# Patient Record
Sex: Female | Born: 1940 | Race: Black or African American | Hispanic: No | Smoking: Current every day smoker
Health system: Southern US, Community
[De-identification: ages and names within clinical notes are randomized; demographics above are authoritative.]

## PROBLEM LIST (undated history)

## (undated) DIAGNOSIS — I69959 Hemiplegia and hemiparesis following unspecified cerebrovascular disease affecting unspecified side: Secondary | ICD-10-CM

## (undated) DIAGNOSIS — K219 Gastro-esophageal reflux disease without esophagitis: Secondary | ICD-10-CM

## (undated) DIAGNOSIS — K760 Fatty (change of) liver, not elsewhere classified: Secondary | ICD-10-CM

## (undated) DIAGNOSIS — E119 Type 2 diabetes mellitus without complications: Secondary | ICD-10-CM

## (undated) DIAGNOSIS — E7800 Pure hypercholesterolemia, unspecified: Secondary | ICD-10-CM

## (undated) DIAGNOSIS — K7460 Unspecified cirrhosis of liver: Secondary | ICD-10-CM

## (undated) DIAGNOSIS — M1990 Unspecified osteoarthritis, unspecified site: Secondary | ICD-10-CM

## (undated) DIAGNOSIS — M810 Age-related osteoporosis without current pathological fracture: Secondary | ICD-10-CM

## (undated) DIAGNOSIS — I219 Acute myocardial infarction, unspecified: Secondary | ICD-10-CM

## (undated) DIAGNOSIS — I10 Essential (primary) hypertension: Secondary | ICD-10-CM

## (undated) DIAGNOSIS — J309 Allergic rhinitis, unspecified: Secondary | ICD-10-CM

## (undated) DIAGNOSIS — K5790 Diverticulosis of intestine, part unspecified, without perforation or abscess without bleeding: Secondary | ICD-10-CM

## (undated) DIAGNOSIS — I639 Cerebral infarction, unspecified: Secondary | ICD-10-CM

## (undated) DIAGNOSIS — R569 Unspecified convulsions: Secondary | ICD-10-CM

## (undated) DIAGNOSIS — I5030 Unspecified diastolic (congestive) heart failure: Secondary | ICD-10-CM

## (undated) DIAGNOSIS — F329 Major depressive disorder, single episode, unspecified: Secondary | ICD-10-CM

## (undated) DIAGNOSIS — K2970 Gastritis, unspecified, without bleeding: Secondary | ICD-10-CM

## (undated) DIAGNOSIS — G43909 Migraine, unspecified, not intractable, without status migrainosus: Secondary | ICD-10-CM

## (undated) DIAGNOSIS — Z780 Asymptomatic menopausal state: Secondary | ICD-10-CM

## (undated) DIAGNOSIS — T464X5A Adverse effect of angiotensin-converting-enzyme inhibitors, initial encounter: Secondary | ICD-10-CM

## (undated) DIAGNOSIS — F32A Depression, unspecified: Secondary | ICD-10-CM

## (undated) DIAGNOSIS — R058 Other specified cough: Secondary | ICD-10-CM

## (undated) DIAGNOSIS — D126 Benign neoplasm of colon, unspecified: Secondary | ICD-10-CM

## (undated) DIAGNOSIS — G4733 Obstructive sleep apnea (adult) (pediatric): Secondary | ICD-10-CM

## (undated) DIAGNOSIS — J449 Chronic obstructive pulmonary disease, unspecified: Secondary | ICD-10-CM

## (undated) DIAGNOSIS — E669 Obesity, unspecified: Secondary | ICD-10-CM

## (undated) DIAGNOSIS — R51 Headache: Secondary | ICD-10-CM

## (undated) DIAGNOSIS — G8929 Other chronic pain: Secondary | ICD-10-CM

## (undated) DIAGNOSIS — G629 Polyneuropathy, unspecified: Secondary | ICD-10-CM

## (undated) DIAGNOSIS — M549 Dorsalgia, unspecified: Secondary | ICD-10-CM

## (undated) DIAGNOSIS — I509 Heart failure, unspecified: Secondary | ICD-10-CM

## (undated) DIAGNOSIS — K3184 Gastroparesis: Secondary | ICD-10-CM

## (undated) DIAGNOSIS — R079 Chest pain, unspecified: Secondary | ICD-10-CM

## (undated) DIAGNOSIS — R0602 Shortness of breath: Secondary | ICD-10-CM

## (undated) DIAGNOSIS — E785 Hyperlipidemia, unspecified: Secondary | ICD-10-CM

## (undated) HISTORY — DX: Chronic obstructive pulmonary disease, unspecified: J44.9

## (undated) HISTORY — DX: Hemiplegia and hemiparesis following unspecified cerebrovascular disease affecting unspecified side: I69.959

## (undated) HISTORY — DX: Unspecified convulsions: R56.9

## (undated) HISTORY — DX: Benign neoplasm of colon, unspecified: D12.6

## (undated) HISTORY — PX: REDUCTION MAMMAPLASTY: SUR839

## (undated) HISTORY — DX: Diverticulosis of intestine, part unspecified, without perforation or abscess without bleeding: K57.90

## (undated) HISTORY — DX: Allergic rhinitis, unspecified: J30.9

## (undated) HISTORY — DX: Unspecified osteoarthritis, unspecified site: M19.90

## (undated) HISTORY — DX: Obstructive sleep apnea (adult) (pediatric): G47.33

## (undated) HISTORY — DX: Age-related osteoporosis without current pathological fracture: M81.0

## (undated) HISTORY — DX: Shortness of breath: R06.02

## (undated) HISTORY — DX: Asymptomatic menopausal state: Z78.0

## (undated) HISTORY — DX: Gastroparesis: K31.84

## (undated) HISTORY — DX: Pure hypercholesterolemia, unspecified: E78.00

## (undated) HISTORY — DX: Fatty (change of) liver, not elsewhere classified: K76.0

## (undated) HISTORY — PX: CATARACT EXTRACTION W/ INTRAOCULAR LENS  IMPLANT, BILATERAL: SHX1307

## (undated) HISTORY — DX: Essential (primary) hypertension: I10

## (undated) HISTORY — DX: Unspecified cirrhosis of liver: K74.60

## (undated) HISTORY — PX: ABDOMINAL HYSTERECTOMY: SHX81

## (undated) HISTORY — DX: Major depressive disorder, single episode, unspecified: F32.9

## (undated) HISTORY — DX: Gastro-esophageal reflux disease without esophagitis: K21.9

## (undated) HISTORY — DX: Chest pain, unspecified: R07.9

## (undated) HISTORY — DX: Gastritis, unspecified, without bleeding: K29.70

---
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
## 1997-09-17 ENCOUNTER — Emergency Department (HOSPITAL_COMMUNITY): Admission: EM | Admit: 1997-09-17 | Discharge: 1997-09-17 | Payer: Self-pay | Admitting: Emergency Medicine

## 1998-12-29 ENCOUNTER — Encounter: Admission: RE | Admit: 1998-12-29 | Discharge: 1999-03-29 | Payer: Self-pay | Admitting: Neurosurgery

## 1999-04-01 ENCOUNTER — Inpatient Hospital Stay (HOSPITAL_COMMUNITY): Admission: EM | Admit: 1999-04-01 | Discharge: 1999-04-02 | Payer: Self-pay | Admitting: Emergency Medicine

## 1999-04-01 ENCOUNTER — Encounter: Payer: Self-pay | Admitting: Emergency Medicine

## 1999-08-18 ENCOUNTER — Emergency Department (HOSPITAL_COMMUNITY): Admission: EM | Admit: 1999-08-18 | Discharge: 1999-08-18 | Payer: Self-pay | Admitting: Emergency Medicine

## 2002-02-20 ENCOUNTER — Other Ambulatory Visit: Admission: RE | Admit: 2002-02-20 | Discharge: 2002-02-20 | Payer: Self-pay | Admitting: Endocrinology

## 2002-06-30 ENCOUNTER — Encounter: Payer: Self-pay | Admitting: Endocrinology

## 2002-06-30 ENCOUNTER — Ambulatory Visit (HOSPITAL_COMMUNITY): Admission: RE | Admit: 2002-06-30 | Discharge: 2002-06-30 | Payer: Self-pay | Admitting: Endocrinology

## 2003-09-09 ENCOUNTER — Ambulatory Visit (HOSPITAL_COMMUNITY): Admission: RE | Admit: 2003-09-09 | Discharge: 2003-09-09 | Payer: Self-pay | Admitting: Endocrinology

## 2003-10-28 ENCOUNTER — Inpatient Hospital Stay (HOSPITAL_BASED_OUTPATIENT_CLINIC_OR_DEPARTMENT_OTHER): Admission: RE | Admit: 2003-10-28 | Discharge: 2003-10-28 | Payer: Self-pay | Admitting: Cardiology

## 2003-11-15 ENCOUNTER — Ambulatory Visit (HOSPITAL_COMMUNITY): Admission: RE | Admit: 2003-11-15 | Discharge: 2003-11-15 | Payer: Self-pay | Admitting: Endocrinology

## 2004-04-19 DIAGNOSIS — I639 Cerebral infarction, unspecified: Secondary | ICD-10-CM

## 2004-04-19 HISTORY — DX: Cerebral infarction, unspecified: I63.9

## 2004-04-22 ENCOUNTER — Inpatient Hospital Stay (HOSPITAL_COMMUNITY): Admission: EM | Admit: 2004-04-22 | Discharge: 2004-04-24 | Payer: Self-pay | Admitting: Emergency Medicine

## 2004-04-23 ENCOUNTER — Encounter (INDEPENDENT_AMBULATORY_CARE_PROVIDER_SITE_OTHER): Payer: Self-pay | Admitting: Cardiology

## 2004-06-25 ENCOUNTER — Ambulatory Visit: Payer: Self-pay | Admitting: Endocrinology

## 2004-09-18 ENCOUNTER — Ambulatory Visit: Payer: Self-pay | Admitting: Endocrinology

## 2004-09-25 ENCOUNTER — Ambulatory Visit: Payer: Self-pay | Admitting: Endocrinology

## 2004-10-01 ENCOUNTER — Ambulatory Visit: Payer: Self-pay | Admitting: Endocrinology

## 2004-10-06 ENCOUNTER — Ambulatory Visit: Payer: Self-pay | Admitting: Internal Medicine

## 2004-10-12 ENCOUNTER — Ambulatory Visit (HOSPITAL_COMMUNITY): Admission: RE | Admit: 2004-10-12 | Discharge: 2004-10-12 | Payer: Self-pay | Admitting: Internal Medicine

## 2004-10-12 ENCOUNTER — Ambulatory Visit: Payer: Self-pay | Admitting: Internal Medicine

## 2004-12-31 ENCOUNTER — Ambulatory Visit: Payer: Self-pay | Admitting: Endocrinology

## 2005-05-27 ENCOUNTER — Emergency Department (HOSPITAL_COMMUNITY): Admission: EM | Admit: 2005-05-27 | Discharge: 2005-05-28 | Payer: Self-pay | Admitting: Emergency Medicine

## 2005-07-01 ENCOUNTER — Ambulatory Visit: Payer: Self-pay | Admitting: Endocrinology

## 2005-07-20 ENCOUNTER — Ambulatory Visit: Payer: Self-pay | Admitting: Endocrinology

## 2005-07-27 ENCOUNTER — Ambulatory Visit: Payer: Self-pay | Admitting: Endocrinology

## 2005-08-11 ENCOUNTER — Ambulatory Visit: Payer: Self-pay | Admitting: Endocrinology

## 2005-09-07 ENCOUNTER — Ambulatory Visit: Payer: Self-pay | Admitting: Endocrinology

## 2005-09-07 ENCOUNTER — Emergency Department (HOSPITAL_COMMUNITY): Admission: EM | Admit: 2005-09-07 | Discharge: 2005-09-07 | Payer: Self-pay | Admitting: Emergency Medicine

## 2005-09-16 ENCOUNTER — Ambulatory Visit: Payer: Self-pay | Admitting: Endocrinology

## 2005-10-07 ENCOUNTER — Ambulatory Visit: Payer: Self-pay | Admitting: Endocrinology

## 2005-10-14 ENCOUNTER — Ambulatory Visit: Payer: Self-pay | Admitting: Endocrinology

## 2006-01-14 ENCOUNTER — Ambulatory Visit: Payer: Self-pay | Admitting: Endocrinology

## 2006-03-25 ENCOUNTER — Emergency Department (HOSPITAL_COMMUNITY): Admission: EM | Admit: 2006-03-25 | Discharge: 2006-03-25 | Payer: Self-pay | Admitting: Emergency Medicine

## 2006-03-29 ENCOUNTER — Ambulatory Visit: Payer: Self-pay | Admitting: Cardiology

## 2006-03-29 ENCOUNTER — Encounter: Payer: Self-pay | Admitting: Cardiology

## 2006-03-29 ENCOUNTER — Observation Stay (HOSPITAL_COMMUNITY): Admission: EM | Admit: 2006-03-29 | Discharge: 2006-03-30 | Payer: Self-pay | Admitting: Emergency Medicine

## 2006-03-29 ENCOUNTER — Ambulatory Visit: Payer: Self-pay | Admitting: Internal Medicine

## 2006-04-07 ENCOUNTER — Ambulatory Visit: Payer: Self-pay

## 2006-04-14 ENCOUNTER — Ambulatory Visit: Payer: Self-pay

## 2006-04-15 ENCOUNTER — Ambulatory Visit: Payer: Self-pay | Admitting: Endocrinology

## 2006-06-29 ENCOUNTER — Ambulatory Visit: Payer: Self-pay | Admitting: Endocrinology

## 2006-07-13 ENCOUNTER — Ambulatory Visit: Payer: Self-pay | Admitting: Endocrinology

## 2006-07-13 LAB — CONVERTED CEMR LAB
BUN: 16 mg/dL (ref 6–23)
GFR calc Af Amer: 93 mL/min
Hgb A1c MFr Bld: 7.3 % — ABNORMAL HIGH (ref 4.6–6.0)
Potassium: 3.8 meq/L (ref 3.5–5.1)
Sodium: 142 meq/L (ref 135–145)

## 2006-10-29 ENCOUNTER — Encounter: Payer: Self-pay | Admitting: Endocrinology

## 2006-10-29 DIAGNOSIS — K219 Gastro-esophageal reflux disease without esophagitis: Secondary | ICD-10-CM

## 2006-10-29 DIAGNOSIS — M810 Age-related osteoporosis without current pathological fracture: Secondary | ICD-10-CM

## 2006-10-29 DIAGNOSIS — J309 Allergic rhinitis, unspecified: Secondary | ICD-10-CM

## 2006-10-29 DIAGNOSIS — K7460 Unspecified cirrhosis of liver: Secondary | ICD-10-CM

## 2006-10-29 HISTORY — DX: Gastro-esophageal reflux disease without esophagitis: K21.9

## 2006-10-29 HISTORY — DX: Allergic rhinitis, unspecified: J30.9

## 2006-10-29 HISTORY — DX: Age-related osteoporosis without current pathological fracture: M81.0

## 2006-10-29 HISTORY — DX: Unspecified cirrhosis of liver: K74.60

## 2007-01-14 ENCOUNTER — Emergency Department (HOSPITAL_COMMUNITY): Admission: EM | Admit: 2007-01-14 | Discharge: 2007-01-15 | Payer: Self-pay | Admitting: Emergency Medicine

## 2007-01-19 ENCOUNTER — Ambulatory Visit: Payer: Self-pay | Admitting: Endocrinology

## 2007-01-19 LAB — CONVERTED CEMR LAB: Hgb A1c MFr Bld: 14.3 % — ABNORMAL HIGH (ref 4.6–6.0)

## 2007-02-09 ENCOUNTER — Ambulatory Visit: Payer: Self-pay | Admitting: Endocrinology

## 2007-02-09 LAB — CONVERTED CEMR LAB
ALT: 11 units/L (ref 0–35)
AST: 13 units/L (ref 0–37)
Albumin: 3 g/dL — ABNORMAL LOW (ref 3.5–5.2)
Alkaline Phosphatase: 84 units/L (ref 39–117)
BUN: 11 mg/dL (ref 6–23)
Basophils Relative: 0.2 % (ref 0.0–1.0)
CO2: 36 meq/L — ABNORMAL HIGH (ref 19–32)
Calcium: 9.2 mg/dL (ref 8.4–10.5)
Chloride: 96 meq/L (ref 96–112)
Creatinine, Ser: 0.9 mg/dL (ref 0.4–1.2)
Crystals: NEGATIVE
Eosinophils Relative: 0.3 % (ref 0.0–5.0)
Hemoglobin: 13 g/dL (ref 12.0–15.0)
LDL Cholesterol: 42 mg/dL (ref 0–99)
Lymphocytes Relative: 27.9 % (ref 12.0–46.0)
Microalb, Ur: 1 mg/dL (ref 0.0–1.9)
Monocytes Relative: 7 % (ref 3.0–11.0)
Mucus, UA: NEGATIVE
Neutro Abs: 6 10*3/uL (ref 1.4–7.7)
Platelets: 243 10*3/uL (ref 150–400)
Potassium: 3 meq/L — ABNORMAL LOW (ref 3.5–5.1)
RDW: 12.1 % (ref 11.5–14.6)
Specific Gravity, Urine: 1.02 (ref 1.000–1.03)
Total Bilirubin: 0.8 mg/dL (ref 0.3–1.2)
Total Protein: 6.3 g/dL (ref 6.0–8.3)
Triglycerides: 97 mg/dL (ref 0–149)
Urobilinogen, UA: 2 (ref 0.0–1.0)
VLDL: 19 mg/dL (ref 0–40)
WBC: 9.1 10*3/uL (ref 4.5–10.5)

## 2007-02-16 ENCOUNTER — Ambulatory Visit: Payer: Self-pay | Admitting: Endocrinology

## 2007-02-16 ENCOUNTER — Ambulatory Visit: Payer: Self-pay | Admitting: Internal Medicine

## 2007-02-27 ENCOUNTER — Encounter: Payer: Self-pay | Admitting: Endocrinology

## 2007-04-20 HISTORY — PX: APPENDECTOMY: SHX54

## 2007-05-05 ENCOUNTER — Inpatient Hospital Stay (HOSPITAL_COMMUNITY): Admission: EM | Admit: 2007-05-05 | Discharge: 2007-05-17 | Payer: Self-pay | Admitting: Emergency Medicine

## 2007-05-06 ENCOUNTER — Encounter (INDEPENDENT_AMBULATORY_CARE_PROVIDER_SITE_OTHER): Payer: Self-pay | Admitting: General Surgery

## 2007-05-10 ENCOUNTER — Encounter (INDEPENDENT_AMBULATORY_CARE_PROVIDER_SITE_OTHER): Payer: Self-pay | Admitting: Surgery

## 2007-05-10 ENCOUNTER — Ambulatory Visit: Payer: Self-pay | Admitting: Vascular Surgery

## 2007-05-22 ENCOUNTER — Ambulatory Visit: Payer: Self-pay | Admitting: Endocrinology

## 2007-05-22 DIAGNOSIS — R109 Unspecified abdominal pain: Secondary | ICD-10-CM | POA: Insufficient documentation

## 2007-05-22 LAB — CONVERTED CEMR LAB
AST: 12 units/L (ref 0–37)
Amylase: 60 units/L (ref 27–131)
Bilirubin, Direct: 0.2 mg/dL (ref 0.0–0.3)
Chloride: 98 meq/L (ref 96–112)
Creatinine, Ser: 1.1 mg/dL (ref 0.4–1.2)
Eosinophils Absolute: 0 10*3/uL (ref 0.0–0.6)
Eosinophils Relative: 0.4 % (ref 0.0–5.0)
Glucose, Bld: 162 mg/dL — ABNORMAL HIGH (ref 70–99)
HCT: 31.9 % — ABNORMAL LOW (ref 36.0–46.0)
Hemoglobin: 10.5 g/dL — ABNORMAL LOW (ref 12.0–15.0)
MCV: 96.8 fL (ref 78.0–100.0)
Monocytes Absolute: 1 10*3/uL — ABNORMAL HIGH (ref 0.2–0.7)
Neutrophils Relative %: 72.9 % (ref 43.0–77.0)
Potassium: 4 meq/L (ref 3.5–5.1)
RBC: 3.3 M/uL — ABNORMAL LOW (ref 3.87–5.11)
Sodium: 137 meq/L (ref 135–145)
Total Bilirubin: 0.7 mg/dL (ref 0.3–1.2)
WBC: 11.9 10*3/uL — ABNORMAL HIGH (ref 4.5–10.5)

## 2007-05-23 ENCOUNTER — Ambulatory Visit: Payer: Self-pay | Admitting: Cardiovascular Disease

## 2007-06-08 ENCOUNTER — Ambulatory Visit: Payer: Self-pay | Admitting: Endocrinology

## 2007-06-09 ENCOUNTER — Encounter: Payer: Self-pay | Admitting: Endocrinology

## 2007-06-09 LAB — CONVERTED CEMR LAB
Amylase: 57 units/L (ref 27–131)
BUN: 10 mg/dL (ref 6–23)
Basophils Relative: 0.4 % (ref 0.0–1.0)
Creatinine, Ser: 1 mg/dL (ref 0.4–1.2)
Crystals: NEGATIVE
Hgb A1c MFr Bld: 7.6 % — ABNORMAL HIGH (ref 4.6–6.0)
Monocytes Relative: 5.9 % (ref 3.0–11.0)
Platelets: 277 10*3/uL (ref 150–400)
Potassium: 2.9 meq/L — ABNORMAL LOW (ref 3.5–5.1)
RBC: 3.9 M/uL (ref 3.87–5.11)
RDW: 13.4 % (ref 11.5–14.6)
Specific Gravity, Urine: 1.02 (ref 1.000–1.03)
Urine Glucose: NEGATIVE mg/dL
Urobilinogen, UA: 2 (ref 0.0–1.0)

## 2007-06-15 ENCOUNTER — Ambulatory Visit: Payer: Self-pay | Admitting: Endocrinology

## 2007-06-15 DIAGNOSIS — M1990 Unspecified osteoarthritis, unspecified site: Secondary | ICD-10-CM | POA: Insufficient documentation

## 2007-06-15 HISTORY — DX: Unspecified osteoarthritis, unspecified site: M19.90

## 2007-09-13 ENCOUNTER — Ambulatory Visit: Payer: Self-pay | Admitting: Endocrinology

## 2007-09-13 DIAGNOSIS — D126 Benign neoplasm of colon, unspecified: Secondary | ICD-10-CM

## 2007-09-13 HISTORY — DX: Benign neoplasm of colon, unspecified: D12.6

## 2008-01-15 ENCOUNTER — Ambulatory Visit: Payer: Self-pay | Admitting: Endocrinology

## 2008-01-15 DIAGNOSIS — E876 Hypokalemia: Secondary | ICD-10-CM | POA: Insufficient documentation

## 2008-01-15 LAB — CONVERTED CEMR LAB
Calcium: 8.9 mg/dL (ref 8.4–10.5)
Chloride: 99 meq/L (ref 96–112)
Creatinine, Ser: 1.1 mg/dL (ref 0.4–1.2)
GFR calc non Af Amer: 53 mL/min
Hgb A1c MFr Bld: 13 % — ABNORMAL HIGH (ref 4.6–6.0)

## 2008-02-09 ENCOUNTER — Telehealth: Payer: Self-pay | Admitting: Endocrinology

## 2008-02-22 ENCOUNTER — Ambulatory Visit: Payer: Self-pay | Admitting: Endocrinology

## 2008-02-22 DIAGNOSIS — R079 Chest pain, unspecified: Secondary | ICD-10-CM

## 2008-02-22 DIAGNOSIS — F329 Major depressive disorder, single episode, unspecified: Secondary | ICD-10-CM

## 2008-02-22 DIAGNOSIS — Z780 Asymptomatic menopausal state: Secondary | ICD-10-CM

## 2008-02-22 DIAGNOSIS — F3289 Other specified depressive episodes: Secondary | ICD-10-CM

## 2008-02-22 DIAGNOSIS — R0602 Shortness of breath: Secondary | ICD-10-CM

## 2008-02-22 DIAGNOSIS — R0789 Other chest pain: Secondary | ICD-10-CM

## 2008-02-22 DIAGNOSIS — F17200 Nicotine dependence, unspecified, uncomplicated: Secondary | ICD-10-CM

## 2008-02-22 HISTORY — DX: Major depressive disorder, single episode, unspecified: F32.9

## 2008-02-22 HISTORY — DX: Other specified depressive episodes: F32.89

## 2008-02-22 HISTORY — DX: Asymptomatic menopausal state: Z78.0

## 2008-02-22 HISTORY — DX: Chest pain, unspecified: R07.9

## 2008-02-25 LAB — CONVERTED CEMR LAB
ALT: 15 units/L (ref 0–35)
AST: 20 units/L (ref 0–37)
Bilirubin, Direct: 0.1 mg/dL (ref 0.0–0.3)
Creatinine,U: 223.4 mg/dL
Eosinophils Relative: 0.5 % (ref 0.0–5.0)
HCT: 40.7 % (ref 36.0–46.0)
Hemoglobin: 14.2 g/dL (ref 12.0–15.0)
Microalb, Ur: 0.7 mg/dL (ref 0.0–1.9)
Monocytes Absolute: 0.6 10*3/uL (ref 0.1–1.0)
Monocytes Relative: 6.6 % (ref 3.0–12.0)
Neutro Abs: 5.3 10*3/uL (ref 1.4–7.7)
Nitrite: NEGATIVE
Specific Gravity, Urine: 1.025 (ref 1.000–1.03)
Total Protein, Urine: NEGATIVE mg/dL
Total Protein: 6.9 g/dL (ref 6.0–8.3)
WBC: 8.8 10*3/uL (ref 4.5–10.5)
pH: 5.5 (ref 5.0–8.0)

## 2008-02-26 ENCOUNTER — Ambulatory Visit: Payer: Self-pay | Admitting: Endocrinology

## 2008-02-27 ENCOUNTER — Telehealth: Payer: Self-pay | Admitting: Endocrinology

## 2008-02-29 ENCOUNTER — Ambulatory Visit: Payer: Self-pay | Admitting: Endocrinology

## 2008-03-06 ENCOUNTER — Encounter: Admission: RE | Admit: 2008-03-06 | Discharge: 2008-03-06 | Payer: Self-pay | Admitting: Endocrinology

## 2008-03-06 ENCOUNTER — Encounter: Payer: Self-pay | Admitting: Endocrinology

## 2008-03-08 ENCOUNTER — Encounter: Payer: Self-pay | Admitting: Endocrinology

## 2008-03-08 ENCOUNTER — Ambulatory Visit: Payer: Self-pay | Admitting: Internal Medicine

## 2008-04-12 ENCOUNTER — Ambulatory Visit: Payer: Self-pay | Admitting: Internal Medicine

## 2008-04-12 ENCOUNTER — Inpatient Hospital Stay (HOSPITAL_COMMUNITY): Admission: EM | Admit: 2008-04-12 | Discharge: 2008-04-14 | Payer: Self-pay | Admitting: Emergency Medicine

## 2008-04-22 ENCOUNTER — Telehealth: Payer: Self-pay | Admitting: Endocrinology

## 2008-04-22 ENCOUNTER — Ambulatory Visit: Payer: Self-pay | Admitting: Endocrinology

## 2008-04-22 LAB — CONVERTED CEMR LAB
CO2: 26 meq/L (ref 19–32)
Calcium: 9.3 mg/dL (ref 8.4–10.5)
Chloride: 106 meq/L (ref 96–112)
Glucose, Bld: 164 mg/dL — ABNORMAL HIGH (ref 70–99)
Sodium: 140 meq/L (ref 135–145)

## 2008-05-13 ENCOUNTER — Telehealth: Payer: Self-pay | Admitting: Endocrinology

## 2008-06-06 ENCOUNTER — Telehealth (INDEPENDENT_AMBULATORY_CARE_PROVIDER_SITE_OTHER): Payer: Self-pay | Admitting: *Deleted

## 2008-06-10 ENCOUNTER — Ambulatory Visit: Payer: Self-pay | Admitting: Endocrinology

## 2008-06-26 ENCOUNTER — Encounter: Payer: Self-pay | Admitting: Endocrinology

## 2008-07-23 ENCOUNTER — Telehealth (INDEPENDENT_AMBULATORY_CARE_PROVIDER_SITE_OTHER): Payer: Self-pay | Admitting: *Deleted

## 2008-12-20 ENCOUNTER — Ambulatory Visit: Payer: Self-pay | Admitting: Endocrinology

## 2008-12-20 DIAGNOSIS — I69959 Hemiplegia and hemiparesis following unspecified cerebrovascular disease affecting unspecified side: Secondary | ICD-10-CM | POA: Insufficient documentation

## 2008-12-20 DIAGNOSIS — I10 Essential (primary) hypertension: Secondary | ICD-10-CM | POA: Insufficient documentation

## 2008-12-20 HISTORY — DX: Hemiplegia and hemiparesis following unspecified cerebrovascular disease affecting unspecified side: I69.959

## 2008-12-20 HISTORY — DX: Essential (primary) hypertension: I10

## 2008-12-20 LAB — CONVERTED CEMR LAB
Creatinine,U: 171.4 mg/dL
Hgb A1c MFr Bld: 7.6 % — ABNORMAL HIGH (ref 4.6–6.5)
Microalb Creat Ratio: 3.5 mg/g (ref 0.0–30.0)

## 2009-01-07 ENCOUNTER — Encounter: Admission: RE | Admit: 2009-01-07 | Discharge: 2009-03-07 | Payer: Self-pay | Admitting: Endocrinology

## 2009-01-18 ENCOUNTER — Encounter: Payer: Self-pay | Admitting: Endocrinology

## 2009-02-07 ENCOUNTER — Ambulatory Visit: Payer: Self-pay | Admitting: Endocrinology

## 2009-02-10 ENCOUNTER — Telehealth (INDEPENDENT_AMBULATORY_CARE_PROVIDER_SITE_OTHER): Payer: Self-pay | Admitting: *Deleted

## 2009-02-11 ENCOUNTER — Ambulatory Visit: Payer: Self-pay | Admitting: Endocrinology

## 2009-02-25 ENCOUNTER — Ambulatory Visit: Payer: Self-pay | Admitting: Endocrinology

## 2009-02-25 DIAGNOSIS — E7800 Pure hypercholesterolemia, unspecified: Secondary | ICD-10-CM | POA: Insufficient documentation

## 2009-02-25 HISTORY — DX: Pure hypercholesterolemia, unspecified: E78.00

## 2009-03-05 ENCOUNTER — Encounter: Payer: Self-pay | Admitting: Endocrinology

## 2009-03-18 ENCOUNTER — Telehealth: Payer: Self-pay | Admitting: Endocrinology

## 2009-03-18 ENCOUNTER — Encounter (HOSPITAL_COMMUNITY): Admission: RE | Admit: 2009-03-18 | Discharge: 2009-04-18 | Payer: Self-pay | Admitting: Endocrinology

## 2009-03-18 ENCOUNTER — Encounter: Payer: Self-pay | Admitting: Endocrinology

## 2009-03-19 ENCOUNTER — Ambulatory Visit: Payer: Self-pay | Admitting: Cardiology

## 2009-03-20 ENCOUNTER — Encounter (INDEPENDENT_AMBULATORY_CARE_PROVIDER_SITE_OTHER): Payer: Self-pay | Admitting: *Deleted

## 2009-03-21 ENCOUNTER — Ambulatory Visit: Payer: Self-pay | Admitting: Endocrinology

## 2009-04-22 ENCOUNTER — Ambulatory Visit: Payer: Self-pay | Admitting: Cardiology

## 2009-04-22 DIAGNOSIS — J449 Chronic obstructive pulmonary disease, unspecified: Secondary | ICD-10-CM

## 2009-04-22 DIAGNOSIS — J4489 Other specified chronic obstructive pulmonary disease: Secondary | ICD-10-CM

## 2009-04-22 HISTORY — DX: Other specified chronic obstructive pulmonary disease: J44.89

## 2009-04-22 HISTORY — DX: Chronic obstructive pulmonary disease, unspecified: J44.9

## 2009-04-23 ENCOUNTER — Ambulatory Visit (HOSPITAL_COMMUNITY): Admission: RE | Admit: 2009-04-23 | Discharge: 2009-04-23 | Payer: Self-pay | Admitting: Cardiology

## 2009-04-23 ENCOUNTER — Encounter: Payer: Self-pay | Admitting: Cardiology

## 2009-04-23 ENCOUNTER — Ambulatory Visit: Payer: Self-pay | Admitting: Cardiology

## 2009-04-23 ENCOUNTER — Ambulatory Visit: Payer: Self-pay

## 2009-04-23 ENCOUNTER — Telehealth: Payer: Self-pay | Admitting: Cardiology

## 2009-04-23 DIAGNOSIS — R0609 Other forms of dyspnea: Secondary | ICD-10-CM

## 2009-04-23 DIAGNOSIS — R0989 Other specified symptoms and signs involving the circulatory and respiratory systems: Secondary | ICD-10-CM | POA: Insufficient documentation

## 2009-04-25 ENCOUNTER — Ambulatory Visit: Payer: Self-pay | Admitting: Endocrinology

## 2009-04-28 ENCOUNTER — Encounter: Payer: Self-pay | Admitting: Cardiology

## 2009-04-28 LAB — CONVERTED CEMR LAB
Basophils Relative: 0.4 % (ref 0.0–3.0)
CO2: 29 meq/L (ref 19–32)
Calcium: 9 mg/dL (ref 8.4–10.5)
Creatinine, Ser: 1 mg/dL (ref 0.4–1.2)
Eosinophils Relative: 0.6 % (ref 0.0–5.0)
GFR calc non Af Amer: 71.03 mL/min (ref >60)
Hemoglobin: 14.2 g/dL (ref 12.0–15.0)
INR: 0.9 (ref 0.8–1.0)
Lymphocytes Relative: 37.3 % (ref 12.0–46.0)
MCHC: 32.3 g/dL (ref 30.0–36.0)
Monocytes Relative: 8.6 % (ref 3.0–12.0)
Neutro Abs: 3.5 10*3/uL (ref 1.4–7.7)
Neutrophils Relative %: 53.1 % (ref 43.0–77.0)
Prothrombin Time: 9.5 s (ref 9.1–11.7)
RBC: 4.41 M/uL (ref 3.87–5.11)
Sodium: 136 meq/L (ref 135–145)
WBC: 6.6 10*3/uL (ref 4.5–10.5)

## 2009-05-05 ENCOUNTER — Ambulatory Visit: Payer: Self-pay | Admitting: Cardiology

## 2009-05-05 ENCOUNTER — Telehealth (INDEPENDENT_AMBULATORY_CARE_PROVIDER_SITE_OTHER): Payer: Self-pay | Admitting: *Deleted

## 2009-05-07 ENCOUNTER — Ambulatory Visit: Payer: Self-pay | Admitting: Pulmonary Disease

## 2009-05-07 DIAGNOSIS — G4710 Hypersomnia, unspecified: Secondary | ICD-10-CM | POA: Insufficient documentation

## 2009-05-07 DIAGNOSIS — G4730 Sleep apnea, unspecified: Secondary | ICD-10-CM

## 2009-05-08 ENCOUNTER — Ambulatory Visit: Payer: Self-pay | Admitting: Cardiology

## 2009-05-08 ENCOUNTER — Ambulatory Visit (HOSPITAL_COMMUNITY): Admission: RE | Admit: 2009-05-08 | Discharge: 2009-05-08 | Payer: Self-pay | Admitting: Cardiology

## 2009-05-08 LAB — CONVERTED CEMR LAB
Basophils Relative: 0.7 % (ref 0.0–3.0)
CO2: 23 meq/L (ref 19–32)
Chloride: 103 meq/L (ref 96–112)
Creatinine, Ser: 1.2 mg/dL (ref 0.4–1.2)
Eosinophils Absolute: 0 10*3/uL (ref 0.0–0.7)
Eosinophils Relative: 0.5 % (ref 0.0–5.0)
Hemoglobin: 14 g/dL (ref 12.0–15.0)
INR: 0.9 (ref 0.8–1.0)
Lymphocytes Relative: 32.2 % (ref 12.0–46.0)
MCHC: 32.7 g/dL (ref 30.0–36.0)
MCV: 99.4 fL (ref 78.0–100.0)
Monocytes Absolute: 0.6 10*3/uL (ref 0.1–1.0)
Neutro Abs: 3.9 10*3/uL (ref 1.4–7.7)
Neutrophils Relative %: 57.8 % (ref 43.0–77.0)
RBC: 4.32 M/uL (ref 3.87–5.11)
Sodium: 139 meq/L (ref 135–145)
WBC: 6.7 10*3/uL (ref 4.5–10.5)

## 2009-05-11 ENCOUNTER — Encounter: Payer: Self-pay | Admitting: Endocrinology

## 2009-05-18 ENCOUNTER — Ambulatory Visit (HOSPITAL_BASED_OUTPATIENT_CLINIC_OR_DEPARTMENT_OTHER): Admission: RE | Admit: 2009-05-18 | Discharge: 2009-05-18 | Payer: Self-pay | Admitting: Pulmonary Disease

## 2009-05-18 ENCOUNTER — Encounter: Payer: Self-pay | Admitting: Pulmonary Disease

## 2009-05-19 ENCOUNTER — Encounter: Payer: Self-pay | Admitting: Cardiology

## 2009-05-20 ENCOUNTER — Ambulatory Visit: Payer: Self-pay | Admitting: Cardiology

## 2009-05-21 ENCOUNTER — Ambulatory Visit: Payer: Self-pay | Admitting: Pulmonary Disease

## 2009-06-25 ENCOUNTER — Ambulatory Visit: Payer: Self-pay | Admitting: Endocrinology

## 2009-07-09 ENCOUNTER — Encounter: Payer: Self-pay | Admitting: Pulmonary Disease

## 2009-08-07 ENCOUNTER — Ambulatory Visit: Payer: Self-pay | Admitting: Pulmonary Disease

## 2009-08-12 ENCOUNTER — Encounter: Payer: Self-pay | Admitting: Pulmonary Disease

## 2009-08-15 ENCOUNTER — Ambulatory Visit: Payer: Self-pay | Admitting: Endocrinology

## 2009-08-15 LAB — CONVERTED CEMR LAB: Blood Glucose, Fingerstick: 312

## 2009-08-25 ENCOUNTER — Telehealth: Payer: Self-pay | Admitting: Pulmonary Disease

## 2009-08-25 ENCOUNTER — Inpatient Hospital Stay (HOSPITAL_COMMUNITY): Admission: EM | Admit: 2009-08-25 | Discharge: 2009-08-28 | Payer: Self-pay | Admitting: Emergency Medicine

## 2009-09-02 ENCOUNTER — Telehealth: Payer: Self-pay | Admitting: Endocrinology

## 2009-09-03 ENCOUNTER — Encounter: Payer: Self-pay | Admitting: Endocrinology

## 2009-09-04 ENCOUNTER — Telehealth (INDEPENDENT_AMBULATORY_CARE_PROVIDER_SITE_OTHER): Payer: Self-pay | Admitting: *Deleted

## 2009-09-04 ENCOUNTER — Emergency Department (HOSPITAL_COMMUNITY)
Admission: EM | Admit: 2009-09-04 | Discharge: 2009-09-04 | Payer: Self-pay | Source: Home / Self Care | Admitting: Emergency Medicine

## 2009-09-05 ENCOUNTER — Ambulatory Visit: Payer: Self-pay | Admitting: Endocrinology

## 2009-09-08 ENCOUNTER — Ambulatory Visit: Payer: Self-pay | Admitting: Endocrinology

## 2009-09-09 ENCOUNTER — Telehealth (INDEPENDENT_AMBULATORY_CARE_PROVIDER_SITE_OTHER): Payer: Self-pay | Admitting: *Deleted

## 2009-09-10 ENCOUNTER — Encounter: Payer: Self-pay | Admitting: Pulmonary Disease

## 2009-09-11 ENCOUNTER — Encounter: Payer: Self-pay | Admitting: Endocrinology

## 2009-09-12 ENCOUNTER — Telehealth (INDEPENDENT_AMBULATORY_CARE_PROVIDER_SITE_OTHER): Payer: Self-pay | Admitting: *Deleted

## 2009-09-25 ENCOUNTER — Telehealth: Payer: Self-pay | Admitting: Endocrinology

## 2009-09-26 ENCOUNTER — Ambulatory Visit: Payer: Self-pay | Admitting: Endocrinology

## 2009-10-03 ENCOUNTER — Telehealth: Payer: Self-pay | Admitting: Pulmonary Disease

## 2009-10-21 ENCOUNTER — Ambulatory Visit: Payer: Self-pay | Admitting: Endocrinology

## 2009-10-26 ENCOUNTER — Encounter: Payer: Self-pay | Admitting: Endocrinology

## 2009-11-04 ENCOUNTER — Ambulatory Visit: Payer: Self-pay | Admitting: Endocrinology

## 2009-11-10 ENCOUNTER — Ambulatory Visit: Payer: Self-pay | Admitting: Pulmonary Disease

## 2009-11-10 ENCOUNTER — Telehealth: Payer: Self-pay | Admitting: Internal Medicine

## 2009-11-27 ENCOUNTER — Ambulatory Visit: Payer: Self-pay | Admitting: Endocrinology

## 2009-12-03 ENCOUNTER — Telehealth (INDEPENDENT_AMBULATORY_CARE_PROVIDER_SITE_OTHER): Payer: Self-pay | Admitting: *Deleted

## 2009-12-07 ENCOUNTER — Encounter: Payer: Self-pay | Admitting: Endocrinology

## 2009-12-08 ENCOUNTER — Encounter: Payer: Self-pay | Admitting: Endocrinology

## 2009-12-09 ENCOUNTER — Encounter: Payer: Self-pay | Admitting: Endocrinology

## 2009-12-18 ENCOUNTER — Ambulatory Visit: Payer: Self-pay | Admitting: Endocrinology

## 2009-12-19 ENCOUNTER — Inpatient Hospital Stay (HOSPITAL_COMMUNITY): Admission: EM | Admit: 2009-12-19 | Discharge: 2009-12-21 | Payer: Self-pay | Admitting: Emergency Medicine

## 2010-01-06 ENCOUNTER — Ambulatory Visit: Payer: Self-pay | Admitting: Endocrinology

## 2010-01-06 DIAGNOSIS — R569 Unspecified convulsions: Secondary | ICD-10-CM

## 2010-01-15 ENCOUNTER — Telehealth: Payer: Self-pay | Admitting: Endocrinology

## 2010-02-01 ENCOUNTER — Encounter: Payer: Self-pay | Admitting: Endocrinology

## 2010-02-03 ENCOUNTER — Ambulatory Visit: Payer: Self-pay | Admitting: Endocrinology

## 2010-02-03 DIAGNOSIS — M25569 Pain in unspecified knee: Secondary | ICD-10-CM

## 2010-02-07 ENCOUNTER — Encounter: Payer: Self-pay | Admitting: Endocrinology

## 2010-02-12 ENCOUNTER — Telehealth: Payer: Self-pay | Admitting: Endocrinology

## 2010-02-12 ENCOUNTER — Ambulatory Visit: Payer: Self-pay | Admitting: Endocrinology

## 2010-03-03 ENCOUNTER — Ambulatory Visit: Payer: Self-pay | Admitting: Endocrinology

## 2010-04-15 ENCOUNTER — Encounter: Payer: Self-pay | Admitting: Endocrinology

## 2010-04-16 ENCOUNTER — Emergency Department (HOSPITAL_COMMUNITY)
Admission: EM | Admit: 2010-04-16 | Discharge: 2010-04-16 | Payer: Self-pay | Source: Home / Self Care | Admitting: Emergency Medicine

## 2010-05-05 ENCOUNTER — Ambulatory Visit
Admission: RE | Admit: 2010-05-05 | Discharge: 2010-05-05 | Payer: Self-pay | Source: Home / Self Care | Attending: Endocrinology | Admitting: Endocrinology

## 2010-05-10 ENCOUNTER — Encounter: Payer: Self-pay | Admitting: Chiropractic Medicine

## 2010-05-10 ENCOUNTER — Encounter: Payer: Self-pay | Admitting: Endocrinology

## 2010-05-21 NOTE — Letter (Signed)
Summary: CMN for Diabetes Supplies/[facility]  CMN for Diabetes Supplies/[facility]   Imported By: Sherian Rein 02/06/2010 14:38:26  _____________________________________________________________________  External Attachment:    Type:   Image     Comment:   External Document

## 2010-05-21 NOTE — Miscellaneous (Signed)
Summary: Face to face Encounter/[facility]  Face to face Encounter/[facility]   Imported By: Sherian Rein 10/01/2009 14:44:46  _____________________________________________________________________  External Attachment:    Type:   Image     Comment:   External Document

## 2010-05-21 NOTE — Assessment & Plan Note (Signed)
Summary: follow up on CPAP//jwr   Copy to:  Dr. Marca Ancona Primary Provider/Referring Provider:  Minus Breeding MD  CC:  Pt is here for a f/u appt.  Pt states she wears cpap machine every night.  Approx 4 hours per night.  Pt states she has a lot of "disturbances" throughout the night such as answering the door and etc which pt states is why she doesn't wear machine for more hours.  Pt denied any complaints with mask.  Pt states she is taking Symbicort but needs rx called into pharmacy.  Pt states breathing has improved since starting Symbicort.  Pt states she is still smoking. [name]  History of Present Illness: 70/F smoker, obese diabetic for FU of COPD & obstructive sleep apnea. She reports chest pains & dyspnea x 2 years on exertion, walking in the parking lot is enough to reproduce symptoms. Echo showed EF 55-60% with grade I diastlic dysfunction, cath .>nml coronaries CXR 03/18/09 no infiltrates or effusions. PFts 11/09 >> moderate airway obstruction, FEV1 54%, ratio 72 . Moderate restriction She admits to excessive daytime somnolence . Epworth Sleepiness Score 12/24. She reports loud nsoring & choking episodes that wake her up from sleep. She sleeps on her side with 2 pillows. Wakes up tired & frequent daytime naps. PSG >> severe obstructive sleep apnea, AHI 106/h ,nadir desatn 78% corrected by CPAP 18 cm , med full face mask, heated humidity  August 07, 2009 3:00 PM  Initial download 2/7-3/23 showed poor compliance. Pt states she wears cpap machine every night.  Approx 4 hours per night.  Pt states she has a lot of "disturbances" throughout the night such as answering the door to an unruly son and etc which pt states is why she doesn't wear machine for more hours.  Pt denied any complaints with mask.  Pt states she is taking Symbicort but needs rx called into pharmacy.  Pt states breathing has improved since starting Symbicort.  Pt states she is still smoking.    Current Medications  (verified): 1)  Hyzaar 100-25 Mg  Tabs (Losartan Potassium-Hctz) .... Take 1 By Mouth Qd 2)  Vytorin 10-80 Mg  Tabs (Ezetimibe-Simvastatin) .... Take 1 By Mouth Qd 3)  Furosemide 20 Mg  Tabs (Furosemide) .... Take 1 By Mouth Qd 4)  Hydrocodone-Acetaminophen 5-500 Mg  Tabs (Hydrocodone-Acetaminophen) .[name].. 1 By Mouth Three Times A Day As Needed 5)  Norvasc 2.5 Mg  Tabs (Amlodipine Besylate) .... Take 1 By Mouth Qd 6)  Klor-Con M20 20 Meq  Tbcr (Potassium Chloride Crys Cr) .... Take 1 By Mouth Qd 7)  Neurontin 100 Mg Caps (Gabapentin) .... Take 1 Q 8 Hours 8)  Bd Pen Needle Short U/f 31g X 8 Mm Misc (Insulin Pen Needle) .... Use As Directed 9)  Truetrack Test  Strp (Glucose Blood) .... Use As Directed 10)  Aspir-Low 81 Mg Tbec (Aspirin) .... One Tablet Daily 11)  Chantix Starting Month Pak 0.5 Mg X 11 & 1 Mg X 42 Tabs (Varenicline Tartrate) .... Use As Directed 12)  Levemir Flexpen 100 Unit/ml Soln (Insulin Detemir) .[name].. 110 Units Each Am 13)  Symbicort 160-4.5 Mcg/act  Aero (Budesonide-Formoterol Fumarate) .... Two Puffs Twice Daily  Allergies (verified): 1)  ! * Actos 2)  ! Zestril  Past History:  Past Medical History: Last updated: 05/20/2009 1. Allergic rhinitis 2. Diabetes mellitus, type I 3. GERD 4. Osteoporosis 5. 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 6. Morbid obesity 7. Dyslipidemia 8. Smoker: PFTs 11/09 with moderate restrictive  disease and mild to moderate obstructive disease.  9. Gallstones 10. Chronic dyspnea on exertion.  Echo (1/11) showed EF 55-60%, mild diastolic dysfunction, valves ok, unable to estimate PA systolic pressure.  11. Chest pain: myoview (11/10) was suggestive of anterior ischemia.  Left heart cath from the right radial artery showed no angiographic coronary disease in 1/11.   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 She has been smoking since  she was 17.   Alcohol Use - no  Risk Factors: Smoking Status: current (05/07/2009) Packs/Day: <0.25 (05/07/2009)  Review of Systems       The patient complains of dyspnea on exertion.  The patient denies anorexia, fever, weight loss, weight gain, vision loss, decreased hearing, hoarseness, chest pain, syncope, peripheral edema, prolonged cough, headaches, hemoptysis, abdominal pain, melena, hematochezia, severe indigestion/heartburn, hematuria, muscle weakness, suspicious skin lesions, difficulty walking, depression, unusual weight change, and abnormal bleeding.    Vital Signs:  Patient profile:   70 year old female Height:      66 inches Weight:      368.13 pounds BMI:     59.63 O2 Sat:      97 % on Room air Temp:     97.9 degrees F oral Pulse rate:   84 / minute BP sitting:   112 / 78  (right arm) Cuff size:   large  Vitals Entered By: Arman Filter LPN (August 07, 2009 2:53 PM)  O2 Flow:  Room air CC: Pt is here for a f/u appt.  Pt states she wears cpap machine every night.  Approx 4 hours per night.  Pt states she has a lot of "disturbances" throughout the night such as answering the door, etc which pt states is why she doesn't wear machine for more hours.  Pt denied any complaints with mask.  Pt states she is taking Symbicort but needs rx called into pharmacy.  Pt states breathing has improved since starting Symbicort.  Pt states she is still smoking.  Comments Medications reviewed with patient Arman Filter LPN  August 07, 2009 2:53 PM    Physical Exam  Additional Exam:  Gen. Pleasant, obese, in no distress, normal affect ENT - no lesions, no post nasal drip Neck: No JVD, no thyromegaly, no carotid bruits Lungs: no use of accessory muscles, no dullness to percussion, decreased BL without rales or rhonchi  Cardiovascular: Rhythm regular, heart sounds  normal, no murmurs or gallops, no peripheral edema Musculoskeletal: No deformities, no cyanosis or clubbing       Impression & Recommendations:  Problem # 1:  COPD (ICD-496) ct symbicort Best to focus on smoking cessation here.  Problem # 2:  HYPERSOMNIA, ASSOCIATED WITH SLEEP APNEA (ICD-780.53) Compliance encouraged, wt loss emphasized, asked to avoid meds with sedative side effects, cautioned against driving when sleepy.  unfortunately, her social problems seem to be the root cause here , offered social work support. Orders: Prescription Created Electronically [id number]) Est. Patient Level IV (60454) DME Referral (DME)  Problem # 3:  SMOKER (ICD-305.1)  The following medications were removed from the medication list:    Chantix Starting Month Pak 0.5 Mg X 11 & 1 Mg X 42 Tabs (Varenicline tartrate) ..... Use as directed Her updated medication list for this problem includes:    Chantix Continuing Month Pak 1 Mg Tabs (Varenicline tartrate) ..... Once daily  Orders: Prescription Created Electronically [id number]) Est.  Patient Level IV ([address]109)  Medications Added to Medication List This Visit: 1)  Symbicort 160-4.5 Mcg/act Aero (Budesonide-formoterol fumarate) .... Two puffs twice daily 2)  Chantix Continuing Month Pak 1 Mg Tabs (Varenicline tartrate) .... Once daily  Patient Instructions: 1)  Copy sent to: Dr Everardo All 2)  Please schedule a follow-up appointment in 3 months with TP 3)  Refills sent 4)  Turn in th echip in 1 month so we can look at download Prescriptions: SYMBICORT 160-4.5 MCG/ACT  AERO (BUDESONIDE-FORMOTEROL FUMARATE) Two puffs twice daily  #1 x 5   Entered and Authorized by:   Comer Locket Vassie Loll MD   Signed by:   Comer Locket Vassie Loll MD on 08/07/2009   Method used:   Electronically to        [facility] [phone]* (retail)       [address]       [place], [place]  [address]       Ph: [phone]       Fax: [phone]   RxID:   [id number] CHANTIX CONTINUING MONTH PAK 1 MG TABS (VARENICLINE TARTRATE) once daily  #30 x 2   Entered and Authorized by:   Comer Locket. Vassie Loll MD    Signed by:   Comer Locket Vassie Loll MD on 08/07/2009   Method used:   Electronically to        [facility] [address] # [phone]* (retail)       [address]       [place], [place]  [address]       Ph: [phone]       Fax: [phone]   RxID:   [id number]   Appended Document: follow up on CPAP//jwr reviewed download 3/24- 08/12/09 >>residual AHI acceptable, scattered usage on 15 cm.

## 2010-05-21 NOTE — Progress Notes (Signed)
Summary: Rx refill req  Phone Note Call from Patient Call back at Home Phone [phone]   Caller: Patient Summary of Call: pt called stating that she is completely out of her insulin, her pharmacy tells her they cannot fill Rx until June 6th and she has none left. [facility] [address]    Prescriptions: LEVEMIR FLEXPEN 100 UNIT/ML SOLN (INSULIN DETEMIR) 160 units each am  #32mth x 11   Entered by:   Margaret Pyle, CMA   Authorized by:   Minus Breeding MD   Signed by:   Margaret Pyle, CMA on 09/02/2009   Method used:   Electronically to        [facility] [address] [phone]* (retail)       [address]       [place], [place]  [address]       Ph: [phone] or [phone]       Fax: [phone]   RxID:   [id number]

## 2010-05-21 NOTE — Miscellaneous (Signed)
Summary: Outpatient Coinsurance Notice  Outpatient Coinsurance Notice   Imported By: Marylou Mccoy 04/29/2009 16:07:14  _____________________________________________________________________  External Attachment:    Type:   Image     Comment:   External Document

## 2010-05-21 NOTE — Miscellaneous (Signed)
  Clinical Lists Changes  Observations: Added new observation of NUCLEAR NOS: NM MYOCAR MULTI /w SPEC   Comparison: None    Findings:  SPECT imaging demonstrates slight generalized decrease   in activity within the anterior wall and septum on the stress   images compared to the rest images.  This is most pronounced in the   septum.  Difficult to completely exclude ischemia.  No fixed   defects to suggest infarct.    Quantitative gated analysis shows normal wall motion.    The resting left ventricular ejection fraction fraction is 51% with   end-diastolic volume of 107 ml and end-systolic volume of 53 ml.    IMPRESSION:   Slight generalized reversibility in the anterior wall and septum,   most pronounced in the septum.  This is borderline in degree and   significance.  Cannot exclude ischemia.    Ejection fraction 51%.    Read By:  Charlett Nose,  M.D.   Released By:  Charlett Nose,  M.D.  (03/19/2009 10:04)      Nuclear Study  Procedure date:  03/19/2009  Findings:      NM MYOCAR MULTI /w SPEC   Comparison: None    Findings:  SPECT imaging demonstrates slight generalized decrease   in activity within the anterior wall and septum on the stress   images compared to the rest images.  This is most pronounced in the   septum.  Difficult to completely exclude ischemia.  No fixed   defects to suggest infarct.    Quantitative gated analysis shows normal wall motion.    The resting left ventricular ejection fraction fraction is 51% with   end-diastolic volume of 107 ml and end-systolic volume of 53 ml.    IMPRESSION:   Slight generalized reversibility in the anterior wall and septum,   most pronounced in the septum.  This is borderline in degree and   significance.  Cannot exclude ischemia.    Ejection fraction 51%.    Read By:  Charlett Nose,  M.D.   Released By:  Charlett Nose,  M.D.

## 2010-05-21 NOTE — Letter (Signed)
Summary: CMN/[facility]  CMN/[facility]   Imported By: Lester [place] 05/28/2009 10:26:41  _____________________________________________________________________  External Attachment:    Type:   Image     Comment:   External Document

## 2010-05-21 NOTE — Miscellaneous (Signed)
  Medications Added * WATER PHYSICAL THERAPY EVAL 438.20       Clinical Lists Changes  Medications: Added new medication of * WATER PHYSICAL THERAPY EVAL 438.20 - Signed Rx of WATER PHYSICAL THERAPY EVAL 438.20;  #0 x 0;  Signed;  Entered by: Minus Breeding MD;  Authorized by: Minus Breeding MD;  Method used: Print then Give to Patient    Prescriptions: WATER PHYSICAL THERAPY EVAL 438.20  #0 x 0   Entered and Authorized by:   Minus Breeding MD   Signed by:   Minus Breeding MD on 12/08/2009   Method used:   Print then Give to Patient   RxID:   [id number]  i printed rx  Rx faxed (see previous note) Margaret Pyle, CMA  December 08, 2009 1:28 PM

## 2010-05-21 NOTE — Assessment & Plan Note (Signed)
Summary: rov after 4wk download   Visit Type:  Follow-up Copy to:  Dr. Marca Ancona Primary Provider/Referring Provider:  Minus Breeding MD  CC:  Pt here for follow up. Pt states has been w/o sleep machine x 2 to 3 weeks. Machine stopped working and pt sent it to [facility]. Pt c/o increased memory loss.  History of Present Illness: 68/F smoker, obese diabetic for FU of COPD & obstructive sleep apnea. Echo showed EF 55-60% with grade I diastlic dysfunction, cath .>nml coronaries CXR 03/18/09 no infiltrates or effusions. PFts 11/09 >> moderate airway obstruction, FEV1 54%, ratio 72 . Moderate restriction PSG >> severe obstructive sleep apnea, AHI 106/h ,nadir desatn 78% corrected by CPAP 18 cm , med full face mask, heated humidity Initial download 2/7-3/23 showed poor compliance. reviewed download 4/27-5/25/11 >> very poor usage  November 10, 2009 1:58 PM  Accompanide by son & daughter in Social worker . [facility] due to poor compliance. She states that it was not recording her usage properly. She continues to smoke. c/o non refreshing sleep   Preventive Screening-Counseling & Management  Alcohol-Tobacco     Smoking Status: current     Packs/Day: 0.5  Current Medications (verified): 1)  Hyzaar 100-25 Mg  Tabs (Losartan Potassium-Hctz) .... Take 1 By Mouth Qd 2)  Vytorin 10-80 Mg  Tabs (Ezetimibe-Simvastatin) .... Take 1 By Mouth Qd 3)  Furosemide 20 Mg  Tabs (Furosemide) .... Take 1 By Mouth Qd 4)  Norvasc 2.5 Mg  Tabs (Amlodipine Besylate) .... Take 1 By Mouth Qd 5)  Klor-Con M20 20 Meq  Tbcr (Potassium Chloride Crys Cr) .... Take 1 By Mouth Qd 6)  Bd Pen Needle Short U/f 31g X 8 Mm Misc (Insulin Pen Needle) .... Use As Directed 7)  Truetrack Test  Strp (Glucose Blood) .... Use As Directed 8)  Aspir-Low 81 Mg Tbec (Aspirin) .... One Tablet Daily 9)  Levemir Flexpen 100 Unit/ml Soln (Insulin Detemir) .... 250 Units Each Am, and 150 Units Pm, and Pen Needles Two  Times A Day. 10)  Symbicort 160-4.5 Mcg/act  Aero (Budesonide-Formoterol Fumarate) .... Two Puffs Twice Daily 11)  Oxycodone-Acetaminophen 10-325 Mg Tabs (Oxycodone-Acetaminophen) .[name].. 1 Every 4 Hrs As Needed For Pain  Allergies (verified): 1)  ! * Actos 2)  ! Zestril  Past History:  Past Medical History: Last updated: 05/20/2009 1. Allergic rhinitis 2. Diabetes mellitus, type I 3. GERD 4. Osteoporosis 5. 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 6. Morbid obesity 7. Dyslipidemia 8. 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  9. Gallstones 10. Chronic dyspnea on exertion.  Echo (1/11) showed EF 55-60%, mild diastolic dysfunction, valves ok, unable to estimate PA systolic pressure.  11. Chest pain: myoview (11/10) was suggestive of anterior ischemia.  Left heart cath from the right radial artery showed no angiographic coronary disease in 1/11.   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 She has been smoking since she was 17.   Alcohol Use - no  Social History: Packs/Day:  0.5  Review of Systems       The patient complains of dyspnea on exertion.  The patient denies anorexia, fever, weight loss, weight gain, vision loss, decreased hearing, hoarseness, chest pain, syncope, peripheral edema, prolonged cough, headaches, hemoptysis, abdominal pain, melena, hematochezia, severe indigestion/heartburn, hematuria, muscle weakness, suspicious skin lesions, difficulty walking, depression,  unusual weight change, and abnormal bleeding.    Vital Signs:  Patient profile:   70 year old female Height:      66 inches Weight:      374.4 pounds O2 Sat:      100 % on Room air Temp:     98.6 degrees F oral Pulse rate:   83 / minute BP sitting:   132 / 80  (right arm) Cuff size:   lower arm large  Vitals Entered By: Zackery Barefoot CMA  (November 10, 2009 1:43 PM)  O2 Flow:  Room air CC: Pt here for follow up. Pt states has been w/o sleep machine x 2 to 3 weeks. Machine stopped working and pt sent it to [facility]. Pt c/o increased memory loss Comments Medications reviewed with patient Verified contact number and pharmacy with patient Zackery Barefoot CMA  November 10, 2009 1:43 PM    Physical Exam  Additional Exam:  Gen. Pleasant, obese, in no distress, normal affect ENT - no lesions, no post nasal drip, class 3 airway Neck: No JVD, no thyromegaly, no carotid bruits Lungs: no use of accessory muscles, no dullness to percussion, decreased BL without rales or rhonchi  Cardiovascular: Rhythm regular, heart sounds  normal, no murmurs or gallops, no peripheral edema Musculoskeletal: No deformities, no cyanosis or clubbing      Impression & Recommendations:  Problem # 1:  HYPERSOMNIA, ASSOCIATED WITH SLEEP APNEA (ICD-780.53)  Will clarify from supplier why CPAP was discontinued - but I have explained to her that given her non compliance, I may not be able to get her CPAP back. Compliance encouraged, wt loss emphasized, asked to avoid meds with sedative side effects, cautioned against driving when sleepy.   Orders: Est. Patient Level III (19147) Prescription Created Electronically [phone])  Problem # 2:  COPD (ICD-496) She continues to smoke. I explained to her that there is not much I can do to help her if she continues down this path. Infact, given her limited progress & poor compliance, I do not have much to add to her care. As such I have asked her to continue further FU with her pMD.   Medications Added to Medication List This Visit: 1)  Levemir Flexpen 100 Unit/ml Soln (Insulin detemir) .... 250 units each am, and 150 units pm, and pen needles two times a day. 2)  Proair Hfa 108 (90 Base) Mcg/act Aers (Albuterol sulfate) .... 2 puffs three times a day as needed  Patient Instructions: 1)  Copy sent to: Dr  Everardo All 2)  Albuterol MDI to use as needed  3)  Work on cutting down & quitting cigarettes - breathing medications will nothave any effect otherwise 4)  Your subsequent FU can be with your primary doctor. Prescriptions: PROAIR HFA 108 (90 BASE) MCG/ACT AERS (ALBUTEROL SULFATE) 2 puffs three times a day as needed  #1 x 2   Entered and Authorized by:   Comer Locket. Vassie Loll MD   Signed by:   Comer Locket Vassie Loll MD on 11/10/2009   Method used:   Electronically to        [facility]. [address] [phone]* (retail)       [address]       [place], [place]  [address]       Ph: [phone] or [phone]       Fax: [phone]   RxID:   [id number]

## 2010-05-21 NOTE — Miscellaneous (Signed)
Summary: Order/[facility]  Order/[facility]   Imported By: Lester [place] 12/11/2009 08:09:50  _____________________________________________________________________  External Attachment:    Type:   Image     Comment:   External Document

## 2010-05-21 NOTE — Progress Notes (Signed)
Summary: CBG  Phone Note Other Incoming   Caller: Warden/ranger w. [facility] [phone] Summary of Call: Delice Bison called to inform MD that pt's fasting CBG this am was 408. Pt has a peanut butter and jelly sandwich with jello last night- all sugar free. Pt CGB have been ranging from 118-585 and RN is suggesting pt be started on oral DM meds as well. Pt has appt scheduled 09/26/09 w [facility] Initial call taken by: Margaret Pyle, CMA,  September 25, 2009 2:41 PM  Follow-up for Phone Call        increase levemir to 200 units am and 100 units pm.  needs f/u appt next week. Follow-up by: Minus Breeding MD,  September 25, 2009 2:44 PM  Additional Follow-up for Phone Call Additional follow up Details #1::        Pt has appt tomorrow, 09/26/2009 Additional Follow-up by: Margaret Pyle, CMA,  September 25, 2009 3:02 PM

## 2010-05-21 NOTE — Progress Notes (Signed)
Summary: REFILL ON HYDROCODONE  Phone Note Call from Patient Call back at Home Phone [phone]   Caller: [facility] Summary of Call: PT IS REQUESTING A REFILL ON HYDROCODONE 10/325.  SHE ONLY HAS A FEW LEFT.  HAS BEEN IN PAIN.  HAS USED 6 PILLS ON SOME DAYS TO RELIEVE THE PAIN.  PT ALSO MADE AN APPT WITH DR Everardo All FOR AUG 11. [facility] [place]. Initial call taken by: Hilarie Fredrickson,  November 10, 2009 2:33 PM  Follow-up for Phone Call        please advise. Lucious Groves CMA  November 10, 2009 3:07 PM   Additional Follow-up for Phone Call Additional follow up Details #1::        done hardcopy to [facility] side B - dahlia  Additional Follow-up by: Corwin Levins MD,  November 10, 2009 5:02 PM    Additional Follow-up for Phone Call Additional follow up Details #2::    Rx faxed to pharmacy Follow-up by: Margaret Pyle, CMA,  November 10, 2009 5:04 PM  Prescriptions: OXYCODONE-ACETAMINOPHEN 10-325 MG TABS (OXYCODONE-ACETAMINOPHEN) 1 every 4 hrs as needed for pain  #100 x 0   Entered and Authorized by:   Corwin Levins MD   Signed by:   Corwin Levins MD on 11/10/2009   Method used:   Print then Give to Patient   RxID:   [id number]   Appended Document: REFILL ON HYDROCODONE Rx in cabinet for pt pick up, pt informed.

## 2010-05-21 NOTE — Assessment & Plan Note (Signed)
Summary: ER FU--BLOOD SUGAR LAST PM:  592-THIS AM:  127--STC   Vital Signs:  Patient profile:   70 year old female Height:      66 inches (167.64 cm) Weight:      371.38 pounds (168.81 kg) O2 Sat:      93 % on Room air Temp:     98.6 degrees F (37.00 degrees C) oral Pulse rate:   95 / minute BP sitting:   118 / 78  (right arm) Cuff size:   regular  Vitals Entered By: Josph Macho RMA (Sep 05, 2009 8:20 AM)  O2 Flow:  Room air CC: ER follow up/ Sugar on 09/03/09-592, 09/04/09 am BS-127, this morning BS was 149/ Pt needs refill on meds/ pt states she is no longer taking Chantix, Hyzaar, or Neurontin/ CF Is Patient Diabetic? Yes   Referring Provider:  Dr. Marca Ancona Primary Provider:  Minus Breeding MD  CC:  ER follow up/ Sugar on 09/03/09-592, 09/04/09 am BS-127, this morning BS was 149/ Pt needs refill on meds/ pt states she is no longer taking Chantix, Hyzaar, and or Neurontin/ CF.  History of Present Illness: pt now takes levemir 120 units two times a day.  no cbg record, but states cbg's are low-100's am and 300 pm.  sob is much better.   Current Medications (verified): 1)  Hyzaar 100-25 Mg  Tabs (Losartan Potassium-Hctz) .... Take 1 By Mouth Qd 2)  Vytorin 10-80 Mg  Tabs (Ezetimibe-Simvastatin) .... Take 1 By Mouth Qd 3)  Furosemide 20 Mg  Tabs (Furosemide) .... Take 1 By Mouth Qd 4)  Hydrocodone-Acetaminophen 5-500 Mg  Tabs (Hydrocodone-Acetaminophen) .[name].. 1 By Mouth Three Times A Day As Needed 5)  Norvasc 2.5 Mg  Tabs (Amlodipine Besylate) .... Take 1 By Mouth Qd 6)  Klor-Con M20 20 Meq  Tbcr (Potassium Chloride Crys Cr) .... Take 1 By Mouth Qd 7)  Neurontin 100 Mg Caps (Gabapentin) .... Take 1 Q 8 Hours 8)  Bd Pen Needle Short U/f 31g X 8 Mm Misc (Insulin Pen Needle) .... Use As Directed 9)  Truetrack Test  Strp (Glucose Blood) .... Use As Directed 10)  Aspir-Low 81 Mg Tbec (Aspirin) .... One Tablet Daily 11)  Levemir Flexpen 100 Unit/ml Soln (Insulin Detemir) .[name]..  160 Units Each Am 12)  Symbicort 160-4.5 Mcg/act  Aero (Budesonide-Formoterol Fumarate) .... Two Puffs Twice Daily 13)  Chantix Continuing Month Pak 1 Mg Tabs (Varenicline Tartrate) .... Once Daily 14)  Prednisone 10 Mg Tabs (Prednisone) .... 2 Tab By Mouth Once Daily For 3 Days Then 1 Tab By Mouth Q4 Hrs 15)  Levaquin 500 Mg Tabs (Levofloxacin) .... Take As Directed 16)  Novolog .... Sliding Scale  Allergies (verified): 1)  ! * Actos 2)  ! Zestril  Past History:  Past Medical History: Last updated: 05/20/2009 1. Allergic rhinitis 2. Diabetes mellitus, type I 3. GERD 4. Osteoporosis 5. 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 6. Morbid obesity 7. Dyslipidemia 8. 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  9. Gallstones 10. Chronic dyspnea on exertion.  Echo (1/11) showed EF 55-60%, mild diastolic dysfunction, valves ok, unable to estimate PA systolic pressure.  11. Chest pain: myoview (11/10) was suggestive of anterior ischemia.  Left heart cath from the right radial artery showed no angiographic coronary disease in 1/11.   Review of Systems  The patient denies hypoglycemia.    Physical Exam  Psych:  Alert and cooperative; normal mood and affect;  normal attention span and concentration.     Impression & Recommendations:  Problem # 1:  DIABETES MELLITUS, TYPE I (ICD-250.01) needs increased rx  Medications Added to Medication List This Visit: 1)  Levemir Flexpen 100 Unit/ml Soln (Insulin detemir) .Marland KitchenMarland KitchenMarland Kitchen 180 units each am, and 100 units pm, and pen needles two times a day. 2)  Prednisone 10 Mg Tabs (Prednisone) .... 2 tab by mouth once daily for 3 days then 1 tab by mouth q4 hrs 3)  Novolog  .... Sliding scale  Other Orders: Est. Patient Level III (04540)  Patient Instructions: 1)  increase levemir to 180 units each am, and 100 units pm. 2)  check your blood sugar 2 times a day.  vary the time of day when you check, between before the 3  meals, and at bedtime.  also check if you have symptoms of your blood sugar being too high or too low.  please keep a record of the readings and bring it to your next appointment here.  please call us sooner if you are having low blood sugar episodes. 3)  Please schedule a follow-up appointment in 3 weeks. Prescriptions: LEVEMIR FLEXPEN 100 UNIT/ML SOLN (INSULIN DETEMIR) 180 units each am, and 100 units pm, and pen needles two times a day.  #7 boxes x 11   Entered and Authorized by:   Minus Breeding MD   Signed by:   Minus Breeding MD on 09/05/2009   Method used:   Electronically to        [facility] [address] [phone]* (retail)       [address]       [place], [place]  [address]       Ph: [phone] or [phone]       Fax: [phone]   RxID:   [id number]

## 2010-05-21 NOTE — Miscellaneous (Signed)
Summary: Plan of Care & Treatment/[facility]  Plan of Care & Treatment/[facility]   Imported By: Sherian Rein 11/05/2009 13:34:35  _____________________________________________________________________  External Attachment:    Type:   Image     Comment:   External Document

## 2010-05-21 NOTE — Letter (Signed)
Summary: CMN for walker/[facility]  CMN for walker/[facility]   Imported By: Sherian Rein 05/14/2009 12:12:52  _____________________________________________________________________  External Attachment:    Type:   Image     Comment:   External Document

## 2010-05-21 NOTE — Miscellaneous (Signed)
Summary: Plan of Care & Treatment/[facility]  Plan of Care & Treatment/[facility]   Imported By: Sherian Rein 09/11/2009 13:27:22  _____________________________________________________________________  External Attachment:    Type:   Image     Comment:   External Document

## 2010-05-21 NOTE — Assessment & Plan Note (Signed)
Summary: 3 WK FU  STC   Vital Signs:  Patient profile:   70 year old female Height:      66 inches (167.64 cm) Weight:      368.25 pounds (167.39 kg) BMI:     59.65 O2 Sat:      92 % on Room air Temp:     99.9 degrees F (37.72 degrees C) oral Pulse rate:   89 / minute BP sitting:   126 / 74  (left arm) Cuff size:   large  Vitals Entered By: Brenton Grills MA (December 18, 2009 9:25 AM)  O2 Flow:  Room air CC: 3 week F/U/aj Is Patient Diabetic? Yes   Referring Provider:  Dr. Marca Ancona Primary Provider:  Minus Breeding MD  CC:  3 week F/U/aj.  History of Present Illness: she has lost a few lbs, due to her efforts.  no cbg record, but states cbg's vary from 100-500.  she says it ih higher as the day goes on.   pt says she requires percocet 1 q4h as needed for pain.    Current Medications (verified): 1)  Hyzaar 100-25 Mg  Tabs (Losartan Potassium-Hctz) .... Take 1 By Mouth Qd 2)  Vytorin 10-80 Mg  Tabs (Ezetimibe-Simvastatin) .... Take 1 By Mouth Qd 3)  Furosemide 20 Mg  Tabs (Furosemide) .... Take 1 By Mouth Qd 4)  Norvasc 2.5 Mg  Tabs (Amlodipine Besylate) .... Take 1 By Mouth Qd 5)  Klor-Con M20 20 Meq  Tbcr (Potassium Chloride Crys Cr) .... Take 1 By Mouth Qd 6)  Bd Pen Needle Short U/f 31g X 8 Mm Misc (Insulin Pen Needle) .... Use As Directed 7)  Truetrack Test  Strp (Glucose Blood) .... Use As Directed 8)  Aspir-Low 81 Mg Tbec (Aspirin) .... One Tablet Daily 9)  Levemir Flexpen 100 Unit/ml Soln (Insulin Detemir) .... 200 Units Two Times A Day 10)  Symbicort 160-4.5 Mcg/act  Aero (Budesonide-Formoterol Fumarate) .... Two Puffs Twice Daily 11)  Oxycodone-Acetaminophen 10-325 Mg Tabs (Oxycodone-Acetaminophen) .[name].. 1 Every 4 Hrs As Needed For Pain 12)  Water Physical Therapy Eval .... 438.20  Allergies (verified): 1)  ! * Actos 2)  ! Zestril  Past History:  Past Medical History: Last updated: 05/20/2009 1. Allergic rhinitis 2. Diabetes mellitus, type I 3.  GERD 4. Osteoporosis 5. 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 6. Morbid obesity 7. Dyslipidemia 8. 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  9. Gallstones 10. Chronic dyspnea on exertion.  Echo (1/11) showed EF 55-60%, mild diastolic dysfunction, valves ok, unable to estimate PA systolic pressure.  11. Chest pain: myoview (11/10) was suggestive of anterior ischemia.  Left heart cath from the right radial artery showed no angiographic coronary disease in 1/11.   Review of Systems  The patient denies hypoglycemia.    Physical Exam  General:  morbidly obese.  no distress.  in wheelchair Psych:  Alert and cooperative; normal mood and affect; normal attention span and concentration.     Impression & Recommendations:  Problem # 1:  DIABETES MELLITUS, TYPE I (ICD-250.01) needs increased rx  Medications Added to Medication List This Visit: 1)  Levemir Flexpen 100 Unit/ml Soln (Insulin detemir) .... 250 units each am, and 175 units in the evening.  Other Orders: Est. Patient Level III ([address])  Patient Instructions: 1)  increase levemir to 250 units each am, and 175 units in the evening. 2)  check your blood sugar 2 times a  day.  vary the time of day when you check, between before the 3 meals, and at bedtime.  also check if you have symptoms of your blood sugar being too high or too low.  please keep a record of the readings and bring it to your next appointment here.  please call us sooner if you are having low blood sugar episodes. 3)  Please schedule a follow-up appointment in 4 weeks. 4)  continue percocet 10/350.  1/2-1 every 4 hrs as needed for pain.  i increased the number to 120 this time, so you will have enough Prescriptions: OXYCODONE-ACETAMINOPHEN 10-325 MG TABS (OXYCODONE-ACETAMINOPHEN) 1 every 4 hrs as needed for pain  #120 x 0   Entered and Authorized by:   Minus Breeding MD   Signed by:   Minus Breeding MD on 12/18/2009   Method  used:   Print then Give to Patient   RxID:   [id number]

## 2010-05-21 NOTE — Miscellaneous (Signed)
Summary: Verbal Order/[facility]  Verbal Order/[facility]   Imported By: Sherian Rein 09/17/2009 13:54:07  _____________________________________________________________________  External Attachment:    Type:   Image     Comment:   External Document

## 2010-05-21 NOTE — Letter (Signed)
Summary: Cardiac Catheterization Instructions- Main Lab  [facility], Main Office  [address]   [place], [place] [address]   Phone: [phone]  Fax: [phone]     04/28/2009 MRN: [id number]  [name] [address] [place], [place]  [address]  Dear [name],   You are scheduled for Cardiac Catheterization on Thursday January 20,2011 with Dr Marca Ancona.  Please arrive at the [facility] at       a.m. 8:30 am  on the day of your procedure.  1. DIET     __x__ Nothing to eat or drink after midnight except your medications with a sip of water.  2. Come to the [place] office on Monday January 17,2011            for lab work.  The lab at [facility] is open from 8:30 a.m. to 1:30 p.m. and 2:30 p.m. to 5:00 p.m.  The lab at [facility] is open from 7:30 a.m. to 5:30 p.m.  You do not have to be fasting.  3. MAKE SURE YOU TAKE YOUR ASPIRIN.  4. __x___ DO NOT TAKE these medications before your procedure:         TAKE HALF OF YOUR USUAL INSULIN DOSE THE MORNING OF THE PROCEDURE      __x__ YOU MAY TAKE ALL of your remaining medications with a small amount of water.   5. Plan for one night stay - bring personal belongings (i.e. toothpaste, toothbrush, etc.)  6. Bring a current list of your medications and current insurance cards.  7. Must have a responsible person to drive you home.   8. Someone must be with you for the first 24 hours after you arrive home.  9. Please wear clothes that are easy to get on and off and wear slip-on shoes.  *Special note: Every effort is made to have your procedure done on time.  Occasionally there are emergencies that present themselves at the hospital that may cause delays.  Please be patient if a delay does occur.  If you have any questions after you get home, please call the office at the number listed above.  Katina Dung, RN, BSN

## 2010-05-21 NOTE — Assessment & Plan Note (Signed)
Summary: FU Natale Milch  #   Vital Signs:  Patient profile:   70 year old female Height:      66 inches (167.64 cm) Weight:      373.25 pounds (169.66 kg) BMI:     60.46 O2 Sat:      94 % on Room air Temp:     98.5 degrees F (36.94 degrees C) oral Pulse rate:   85 / minute BP sitting:   134 / 72  (left arm) Cuff size:   lower arm large  Vitals Entered By: Brenton Grills MA (November 27, 2009 8:50 AM)  O2 Flow:  Room air CC: F/U appt/aj Is Patient Diabetic? Yes   Referring Provider:  Dr. Marca Ancona Primary Provider:  Minus Breeding MD  CC:  F/U appt/aj.  History of Present Illness: the status of at least 3 ongoing medical problems is addressed today: dm:  no cbg record, but states cbg's vary from 100-300.  it is in general higher as the day goes on.  pt states she feels well in general.  pt says she takes levemir 150 units am and 250 in the evening.   htn:  she takes meds as rx'ed. oa:  she says percocet works better for her pain.  no numbness, except slight at the left great toe.  Current Medications (verified): 1)  Hyzaar 100-25 Mg  Tabs (Losartan Potassium-Hctz) .... Take 1 By Mouth Qd 2)  Vytorin 10-80 Mg  Tabs (Ezetimibe-Simvastatin) .... Take 1 By Mouth Qd 3)  Furosemide 20 Mg  Tabs (Furosemide) .... Take 1 By Mouth Qd 4)  Norvasc 2.5 Mg  Tabs (Amlodipine Besylate) .... Take 1 By Mouth Qd 5)  Klor-Con M20 20 Meq  Tbcr (Potassium Chloride Crys Cr) .... Take 1 By Mouth Qd 6)  Bd Pen Needle Short U/f 31g X 8 Mm Misc (Insulin Pen Needle) .... Use As Directed 7)  Truetrack Test  Strp (Glucose Blood) .... Use As Directed 8)  Aspir-Low 81 Mg Tbec (Aspirin) .... One Tablet Daily 9)  Levemir Flexpen 100 Unit/ml Soln (Insulin Detemir) .... 250 Units Each Am, and 150 Units Pm, and Pen Needles Two Times A Day. 10)  Symbicort 160-4.5 Mcg/act  Aero (Budesonide-Formoterol Fumarate) .... Two Puffs Twice Daily 11)  Oxycodone-Acetaminophen 10-325 Mg Tabs (Oxycodone-Acetaminophen) .[name].. 1  Every 4 Hrs As Needed For Pain 12)  Proair Hfa 108 (90 Base) Mcg/act Aers (Albuterol Sulfate) .... 2 Puffs Three Times A Day As Needed  Allergies (verified): 1)  ! * Actos 2)  ! Zestril  Past History:  Past Medical History: Last updated: 05/20/2009 1. Allergic rhinitis 2. Diabetes mellitus, type I 3. GERD 4. Osteoporosis 5. 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 6. Morbid obesity 7. Dyslipidemia 8. 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  9. Gallstones 10. Chronic dyspnea on exertion.  Echo (1/11) showed EF 55-60%, mild diastolic dysfunction, valves ok, unable to estimate PA systolic pressure.  11. Chest pain: myoview (11/10) was suggestive of anterior ischemia.  Left heart cath from the right radial artery showed no angiographic coronary disease in 1/11.   Review of Systems  The patient denies hypoglycemia, weight loss, and weight gain.    Physical Exam  General:  morbidly obese.   Pulses:  dorsalis pedis absent bilat, prob due to edema.    Extremities:  no deformity.  no ulcer on the feet.  feet are of normal color and temp.  2+ right pedal edema and 2+  left pedal edema.   Neurologic:  sensation is intact to touch on the feet   Impression & Recommendations:  Problem # 1:  DIABETES MELLITUS, TYPE I (ICD-250.01) needs increased rx  Problem # 2:  DEGENERATIVE JOINT DISEASE (ICD-715.90) pain is well-controlled  Problem # 3:  HYPERTENSION (ICD-401.9) well-controlled  Medications Added to Medication List This Visit: 1)  Levemir Flexpen 100 Unit/ml Soln (Insulin detemir) .... 200 units two times a day  Other Orders: TLB-A1C / Hgb A1C (Glycohemoglobin) (83036-A1C) Est. Patient Level IV ([address]11)  Patient Instructions: 1)  blood tests are being ordered for you today.  please call [phone] to hear your test results. 2)  pending the test results, please increase levemir to 200 units two times a day 3)  check your blood sugar 2 times  a day.  vary the time of day when you check, between before the 3 meals, and at bedtime.  also check if you have symptoms of your blood sugar being too high or too low.  please keep a record of the readings and bring it to your next appointment here.  please call us sooner if you are having low blood sugar episodes. 4)  Please schedule a follow-up appointment in 3 weeks. 5)  continue percocet 10/350.  1/2-1 every 4 hrs as needed for pain. 6)  same medications for blood pressure for now.   Prescriptions: OXYCODONE-ACETAMINOPHEN 10-325 MG TABS (OXYCODONE-ACETAMINOPHEN) 1 every 4 hrs as needed for pain  #100 x 0   Entered and Authorized by:   Minus Breeding MD   Signed by:   Minus Breeding MD on 11/27/2009   Method used:   Print then Give to Patient   RxID:   [id number]

## 2010-05-21 NOTE — Progress Notes (Signed)
Summary: cpap  Phone Note Call from Patient   Caller: [facility] Call For: alva Summary of Call: pt complaining of chest pain today she has been advised to go to ed-also not being compliant on cpap-advised to try to use it more and she might need to try bipap Initial call taken by: Oneita Jolly,  Aug 25, 2009 3:12 PM  Follow-up for Phone Call        agree with recommendation . alternativel, see PMD Follow-up by: Comer Locket. Vassie Loll MD,  Aug 25, 2009 5:50 PM     Appended Document: cpap reviewed download 4/27-5/25/11 >> very poor usage  - let her know that I will send  a Rx to [facility] CPAP since she is not using this anyways  Appended Document: cpap phone note created.

## 2010-05-21 NOTE — Assessment & Plan Note (Signed)
Summary: FU--STC   Vital Signs:  Patient profile:   70 year old female Height:      66 inches (167.64 cm) Weight:      375 pounds (170.45 kg) O2 Sat:      94 % on Room air Temp:     98.0 degrees F (36.67 degrees C) oral Pulse rate:   94 / minute BP sitting:   118 / 72  (left arm) Cuff size:   regular  Vitals Entered By: Josph Macho RMA (August 15, 2009 8:40 AM)  O2 Flow:  Room air CC: Follow-up visit/ CF Is Patient Diabetic? Yes CBG Result 312   Referring Provider:  Dr. Marca Ancona Primary Provider:  Minus Breeding MD  CC:  Follow-up visit/ CF.  History of Present Illness: .she brings a record of her cbg's which i have reviewed today.  it varies from 200-500, with no trend throughout the day.   she says the hyperglycemia makes her drowsy.    Current Medications (verified): 1)  Hyzaar 100-25 Mg  Tabs (Losartan Potassium-Hctz) .... Take 1 By Mouth Qd 2)  Vytorin 10-80 Mg  Tabs (Ezetimibe-Simvastatin) .... Take 1 By Mouth Qd 3)  Furosemide 20 Mg  Tabs (Furosemide) .... Take 1 By Mouth Qd 4)  Hydrocodone-Acetaminophen 5-500 Mg  Tabs (Hydrocodone-Acetaminophen) .[name].. 1 By Mouth Three Times A Day As Needed 5)  Norvasc 2.5 Mg  Tabs (Amlodipine Besylate) .... Take 1 By Mouth Qd 6)  Klor-Con M20 20 Meq  Tbcr (Potassium Chloride Crys Cr) .... Take 1 By Mouth Qd 7)  Neurontin 100 Mg Caps (Gabapentin) .... Take 1 Q 8 Hours 8)  Bd Pen Needle Short U/f 31g X 8 Mm Misc (Insulin Pen Needle) .... Use As Directed 9)  Truetrack Test  Strp (Glucose Blood) .... Use As Directed 10)  Aspir-Low 81 Mg Tbec (Aspirin) .... One Tablet Daily 11)  Levemir Flexpen 100 Unit/ml Soln (Insulin Detemir) .[name].. 110 Units Each Am 12)  Symbicort 160-4.5 Mcg/act  Aero (Budesonide-Formoterol Fumarate) .... Two Puffs Twice Daily 13)  Chantix Continuing Month Pak 1 Mg Tabs (Varenicline Tartrate) .... Once Daily  Allergies (verified): 1)  ! * Actos 2)  ! Zestril  Past History:  Past Medical History: Last  updated: 05/20/2009 1. Allergic rhinitis 2. Diabetes mellitus, type I 3. GERD 4. Osteoporosis 5. 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 6. Morbid obesity 7. Dyslipidemia 8. 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  9. Gallstones 10. Chronic dyspnea on exertion.  Echo (1/11) showed EF 55-60%, mild diastolic dysfunction, valves ok, unable to estimate PA systolic pressure.  11. Chest pain: myoview (11/10) was suggestive of anterior ischemia.  Left heart cath from the right radial artery showed no angiographic coronary disease in 1/11.   Review of Systems       she has intermittent nausea.  Physical Exam  General:  morbidly obese.  in wheelchair.  no distress    Impression & Recommendations:  Problem # 1:  DIABETES MELLITUS, TYPE I (ICD-250.01) needs increased rx  Medications Added to Medication List This Visit: 1)  Levemir Flexpen 100 Unit/ml Soln (Insulin detemir) .[name].. 160 units each am  Other Orders: Est. Patient Level III ([address])  Patient Instructions: 1)  increase levemir to 160 units each am.   2)  check your blood sugar 2 times a day.  vary the time of day when you check, between before the 3 meals, and at bedtime.  also check if you  have symptoms of your blood sugar being too high or too low.  please keep a record of the readings and bring it to your next appointment here.  please call us sooner if you are having low blood sugar episodes. 3)  if your blood sugar continues to be over 150 all of the time, increase to 170 units each am. 4)  Please schedule a follow-up appointment in 2 weeks.    Laboratory Results   Blood Tests     CBG Random:: 312mg /dL

## 2010-05-21 NOTE — Progress Notes (Signed)
Summary: Oxycodone  Phone Note Call from Patient Call back at Home Phone [phone]   Caller: Patient Summary of Call: Pt came into clinic and filled out a walk in form stating she only has 6 Oxycodone left and is requesting a refill. Initial call taken by: Margaret Pyle, CMA,  January 15, 2010 2:04 PM  Follow-up for Phone Call        i printed Follow-up by: Minus Breeding MD,  January 15, 2010 4:07 PM  Additional Follow-up for Phone Call Additional follow up Details #1::        Pt informed, Rx in cabinet for pt pick up Additional Follow-up by: Margaret Pyle, CMA,  January 15, 2010 4:29 PM    Prescriptions: OXYCODONE-ACETAMINOPHEN 10-325 MG TABS (OXYCODONE-ACETAMINOPHEN) 1 every 4 hrs as needed for pain  #120 x 0   Entered and Authorized by:   Minus Breeding MD   Signed by:   Minus Breeding MD on 01/15/2010   Method used:   Print then Give to Patient   RxID:   [id number]

## 2010-05-21 NOTE — Progress Notes (Signed)
Summary: PT request  Phone Note Other Incoming   Caller: Edwina RN [facility] [phone] Summary of Call: HHRN called to inform MD that pt has completedin home rehab and therapy but is requesting out pt aquatic PT. RN is requesting Rx for Out Patient Water Therapy Evaluation to [phone] Fax and [facility] will arrange therapy. HHRN aware that MD put of office and will wait for his reply. Initial call taken by: Margaret Pyle, CMA,  December 03, 2009 3:02 PM

## 2010-05-21 NOTE — Assessment & Plan Note (Signed)
Summary: 1 month rov/sl  Medications Added WELLBUTRIN SR 150 MG XR12H-TAB (BUPROPION HCL) one daily for three days then one twice a day CHANTIX STARTING MONTH PAK 0.5 MG X 11 & 1 MG X 42 TABS (VARENICLINE TARTRATE) use as directed        Visit Type:  1 mo f/u Referring Provider:  Dr. Marca Ancona Primary Provider:  Minus Breeding MD  CC:  sob...edema/hands...  History of Present Illness: 70 yo with history morbid obesity, prior stroke, diabetes, HTN, and smoking presents for followup of chest pain and dyspnea.  Patient had an episode of severe chest tightness and shortness of breath while carrying groceries into her house in 10/10. It lasted for 5-10 minutes and resolved with rest.  With ambulation, she gets mild chest tightness associated with dyspnea.  This has been going on for a long time.  She has had severe exertional dyspnea for up to 20 years.  She is morbidly obese and walks with a cane.  She is short of breath just walking in her house.  She uses a scooter to get around outside the house.  Patient sleeps on 2 pillows.  She snores and night and her son says she makes choking noises.  She has significant daytime sleepiness.    Patient had a Lexiscan myoview in 11/10 given her chest pain.  This showed EF 51%, slight reversible anterior and septal defect of borderline significance.  Given her body habitus, this certainly could have been due to shifting breast attenuation.   Given the abnormal stress test, patient underwent a left heart cath from the right radial artery.  There was no angiographic coronary disease.  Echo showed normal EF with mild diastolic dysfunction and no significant valvular abnormalities.   Labs (1/11): BNP 21, creatinine 1.0  Current Medications (verified): 1)  Hyzaar 100-25 Mg  Tabs (Losartan Potassium-Hctz) .... Take 1 By Mouth Qd 2)  Vytorin 10-80 Mg  Tabs (Ezetimibe-Simvastatin) .... Take 1 By Mouth Qd 3)  Furosemide 20 Mg  Tabs (Furosemide) .... Take 1 By  Mouth Qd 4)  Hydrocodone-Acetaminophen 5-500 Mg  Tabs (Hydrocodone-Acetaminophen) .[name].. 1 By Mouth Three Times A Day As Needed 5)  Norvasc 2.5 Mg  Tabs (Amlodipine Besylate) .... Take 1 By Mouth Qd 6)  Klor-Con M20 20 Meq  Tbcr (Potassium Chloride Crys Cr) .... Take 1 By Mouth Qd 7)  Humalog Mix 75/25 Kwikpen 75-25 %  Susp (Insulin Lispro Prot & Lispro) .[name].. 120 Units Qam 8)  Neurontin 100 Mg Caps (Gabapentin) .... Take 1 Q 8 Hours 9)  Wellbutrin Sr 150 Mg Xr12h-Tab (Bupropion Hcl) .... One Daily For Three Days Then One Twice A Day 10)  Bd Pen Needle Short U/f 31g X 8 Mm Misc (Insulin Pen Needle) .... Use As Directed 11)  Truetrack Test  Strp (Glucose Blood) .... Use As Directed 12)  Aspir-Low 81 Mg Tbec (Aspirin) .... One Tablet Daily 13)  Symbicort 160-4.5 Mcg/act Aero (Budesonide-Formoterol Fumarate) .... 2 Puffs Two Times A Day 14)  Chantix Starting Month Pak 0.5 Mg X 11 & 1 Mg X 42 Tabs (Varenicline Tartrate) .... Use As Directed  Allergies: 1)  ! * Actos 2)  ! Zestril  Past History:  Past Medical History: 1. Allergic rhinitis 2. Diabetes mellitus, type I 3. GERD 4. Osteoporosis 5. 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 6. Morbid obesity 7. Dyslipidemia 8. 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  9. Gallstones 10. Chronic dyspnea on exertion.  Echo (1/11) showed EF 55-60%, mild diastolic dysfunction, valves ok, unable to estimate PA systolic pressure.  11. Chest pain: myoview (11/10) was suggestive of anterior ischemia.  Left heart cath from the right radial artery showed no angiographic coronary disease in 1/11.   Family History: Reviewed history from 05/07/2009 and no changes required. mother had uncertain type of cancer  No early early CAD.   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 She has been smoking  since she was 17.   Alcohol Use - no  Vital Signs:  Patient profile:   70 year old female Height:      66 inches Weight:      383 pounds BMI:     62.04 Pulse rate:   91 / minute Pulse rhythm:   regular BP sitting:   132 / 64  (left arm) Cuff size:   large  Vitals Entered By: Danielle Rankin, CMA (May 20, 2009 1:40 PM)  Physical Exam  General:  morbidly obese.  no distress Neck:  Thick neck, difficult to assess JVP. No masses, thyromegaly or abnormal cervical nodes. Lungs:  Slightly decreased breath sounds bilaterally.  Heart:  Heart sounds distant.  Chest non-tender; regular rate and rhythm, S1, S2 without murmurs, rubs or gallops. Carotid upstroke normal, no bruit.  Pedals normal pulses. 1+ edema 1/2 up lower legs L>R. Right radial pulse 2+.  Abdomen:  Bowel sounds positive; abdomen soft and non-tender without masses, organomegaly, or hernias noted. No hepatosplenomegaly.  Obese.  Extremities:  No clubbing or cyanosis. Neurologic:  Alert and oriented x 3. Psych:  Normal affect.   Impression & Recommendations:  Problem # 1:  CHEST PAIN (ICD-786.50) No angiographic coronary disease.  Suspect chest pain was noncardiac.  Right radial cath site is benign. Would have her continue ASA 81 mg daily given her history of stroke.   Problem # 2:  HYPERTENSION (ICD-401.9) BP is at goal on current regimen.   Problem # 3:  DYSPNEA (ICD-786.05) Suspect this is mainly due to COPD and obesity/deconditioning.  She had only mild diastolic dysfunction on echo with normal LV systolic function.  I do not think there is any indication at this point to increase her Lasix.   Patient may followup as needed.

## 2010-05-21 NOTE — Assessment & Plan Note (Signed)
Summary: 3 WK FU  STC   Vital Signs:  Patient profile:   70 year old female Height:      66 inches Weight:      376.50 pounds BMI:     60.99 O2 Sat:      92 % on Room air Temp:     98.1 degrees F oral Pulse rate:   94 / minute BP sitting:   140 / 90  (left arm) Cuff size:   large  Vitals Entered By: Lucious Groves (September 26, 2009 11:19 AM)  O2 Flow:  Room air CC: 3 wk f/u./kb Is Patient Diabetic? Yes Pain Assessment Patient in pain? no      Comments Patient states that she is not taking Neurontin or Prednisone./kb   Referring Provider:  Dr. Marca Ancona Primary Provider:  Minus Breeding MD  CC:  3 wk f/u./kb.  History of Present Illness: no cbg record, but states cbg's are consistently over 300.  there is no trend throughout the day.  she has lost a few lbs.   pt has 1 year of moderate weakness at the right hand, and associated pain.  however, she says the pain is a minor contributor to the weakness.   she says she has doe with slight exertion. vicodin provides insufficient pain relief, or relief is short duration  Current Medications (verified): 1)  Hyzaar 100-25 Mg  Tabs (Losartan Potassium-Hctz) .... Take 1 By Mouth Qd 2)  Vytorin 10-80 Mg  Tabs (Ezetimibe-Simvastatin) .... Take 1 By Mouth Qd 3)  Furosemide 20 Mg  Tabs (Furosemide) .... Take 1 By Mouth Qd 4)  Hydrocodone-Acetaminophen 5-500 Mg  Tabs (Hydrocodone-Acetaminophen) .[name].. 1 By Mouth Three Times A Day As Needed 5)  Norvasc 2.5 Mg  Tabs (Amlodipine Besylate) .... Take 1 By Mouth Qd 6)  Klor-Con M20 20 Meq  Tbcr (Potassium Chloride Crys Cr) .... Take 1 By Mouth Qd 7)  Neurontin 100 Mg Caps (Gabapentin) .... Take 1 Q 8 Hours 8)  Bd Pen Needle Short U/f 31g X 8 Mm Misc (Insulin Pen Needle) .... Use As Directed 9)  Truetrack Test  Strp (Glucose Blood) .... Use As Directed 10)  Aspir-Low 81 Mg Tbec (Aspirin) .... One Tablet Daily 11)  Levemir Flexpen 100 Unit/ml Soln (Insulin Detemir) .Marland KitchenMarland KitchenMarland Kitchen 180 Units Each Am, and  100 Units Pm, and Pen Needles Two Times A Day. 12)  Symbicort 160-4.5 Mcg/act  Aero (Budesonide-Formoterol Fumarate) .... Two Puffs Twice Daily 13)  Prednisone 10 Mg Tabs (Prednisone) .... 2 Tab By Mouth Once Daily For 3 Days Then 1 Tab By Mouth Q4 Hrs 14)  Novolog .... Sliding Scale  Allergies (verified): 1)  ! * Actos 2)  ! Zestril  Review of Systems  The patient denies syncope.         denies n/v  Physical Exam  General:  morbidly obese.  no distress  Msk:  strength is 4/5 left hand, 3/5 right hand gait is slow but steady with a cane Extremities:  no deformity of the hands Neurologic:  sensation is intact to touch on the hands, except decreased at the radial aspect of the right hand.   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 250 units each am, and 150 units pm, and pen needles two times a day. 2)  Hydrocodone-acetaminophen 10-325 Mg Tabs (Hydrocodone-acetaminophen) .... 1/2-1 every 4 hrs as needed for pain.  please cancel previous vicodin rx  Other Orders: Est. Patient Level IV ([address]109)  Patient Instructions: 1)  increase levemir to 250 units each am, and 150 units pm. 2)  check your blood sugar 2 times a day.  vary the time of day when you check, between before the 3 meals, and at bedtime.  also check if you have symptoms of your blood sugar being too high or too low.  please keep a record of the readings and bring it to your next appointment here.  please call us sooner if you are having low blood sugar episodes. 3)  Please  schedule a follow-up appointment in 1-2 weeks. 4)  i [facility] to face 5)  change vicodin to 10/350.  1/2-1 every 4 hrs as needed for pain Prescriptions: HYDROCODONE-ACETAMINOPHEN 10-325 MG TABS (HYDROCODONE-ACETAMINOPHEN) 1/2-1 every 4 hrs as needed for pain.  please cancel previous vicodin rx  #100 x 5   Entered and Authorized by:   Minus Breeding MD   Signed by:   Minus Breeding MD on 09/26/2009   Method used:   Print then Give to Patient   RxID:   [id number] LEVEMIR FLEXPEN 100 UNIT/ML SOLN (INSULIN DETEMIR) 250 units each am, and 150 units pm, and pen needles two times a day.  #10 boxes x 11   Entered and Authorized by:   Minus Breeding MD   Signed by:   Minus Breeding MD on 09/26/2009   Method used:   Electronically to        [facility] [address] [phone]* (retail)       [address]       [place], [place]  [address]       Ph: [phone] or [phone]       Fax: [phone]   RxID:   [id number]

## 2010-05-21 NOTE — Assessment & Plan Note (Signed)
Summary: 1 wk f/u / # cd   Vital Signs:  Patient profile:   70 year old female Height:      66 inches (167.64 cm) Weight:      374.50 pounds (170.23 kg) BMI:     60.66 O2 Sat:      91 % on Room air Temp:     98.3 degrees F (36.83 degrees C) oral Pulse rate:   78 / minute BP sitting:   142 / 84  (left arm) Cuff size:   large  Vitals Entered By: Brenton Grills MA (October 21, 2009 8:05 AM)  O2 Flow:  Room air CC: 1 wk f/u/aj   Referring Provider:  Dr. Marca Ancona Primary Provider:  Minus Breeding MD  CC:  1 wk f/u/aj.  History of Present Illness: the status of at least 3 ongoing medical problems is addressed today: dm:  no cbg record, but states cbg's are improved.  she says cbg's are high-100's in am, and 200's in the afternoon.   spinal oa:  pt says the vicodin does not adequately control her pain. htn:  pt says she takes her bp meds as rx'ed, and tolerates them well.  Current Medications (verified): 1)  Hyzaar 100-25 Mg  Tabs (Losartan Potassium-Hctz) .... Take 1 By Mouth Qd 2)  Vytorin 10-80 Mg  Tabs (Ezetimibe-Simvastatin) .... Take 1 By Mouth Qd 3)  Furosemide 20 Mg  Tabs (Furosemide) .... Take 1 By Mouth Qd 4)  Norvasc 2.5 Mg  Tabs (Amlodipine Besylate) .... Take 1 By Mouth Qd 5)  Klor-Con M20 20 Meq  Tbcr (Potassium Chloride Crys Cr) .... Take 1 By Mouth Qd 6)  Bd Pen Needle Short U/f 31g X 8 Mm Misc (Insulin Pen Needle) .... Use As Directed 7)  Truetrack Test  Strp (Glucose Blood) .... Use As Directed 8)  Aspir-Low 81 Mg Tbec (Aspirin) .... One Tablet Daily 9)  Levemir Flexpen 100 Unit/ml Soln (Insulin Detemir) .... 250 Units Each Am, and 150 Units Pm, and Pen Needles Two Times A Day. 10)  Symbicort 160-4.5 Mcg/act  Aero (Budesonide-Formoterol Fumarate) .... Two Puffs Twice Daily 11)  Novolog .... Sliding Scale 12)  Hydrocodone-Acetaminophen 10-325 Mg Tabs (Hydrocodone-Acetaminophen) .... 1/2-1 Every 4 Hrs As Needed For Pain.  Please Cancel Previous Vicodin  Rx  Allergies (verified): 1)  ! * Actos 2)  ! Zestril  Past History:  Past Medical History: Last updated: 05/20/2009 1. Allergic rhinitis 2. Diabetes mellitus, type I 3. GERD 4. Osteoporosis 5. 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 6. Morbid obesity 7. Dyslipidemia 8. 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  9. Gallstones 10. Chronic dyspnea on exertion.  Echo (1/11) showed EF 55-60%, mild diastolic dysfunction, valves ok, unable to estimate PA systolic pressure.  11. Chest pain: myoview (11/10) was suggestive of anterior ischemia.  Left heart cath from the right radial artery showed no angiographic coronary disease in 1/11.   Review of Systems  The patient denies hypoglycemia.         she says her sob is slightly better recently  Physical Exam  General:  morbidly obese.  in wheelchair.  no distress. Msk:  back is nontender   Impression & Recommendations:  Problem # 1:  DIABETES MELLITUS, TYPE I (ICD-250.01) Assessment Improved needs increased rx  Problem # 2:  DEGENERATIVE JOINT DISEASE (ICD-715.90) pain needs increased rx  Problem # 3:  HYPERTENSION (ICD-401.9) may be exac by pain  Medications Added to  Medication List This Visit: 1)  Levemir Flexpen 100 Unit/ml Soln (Insulin detemir) .... 300 units each am, and 150 units pm, and pen needles two times a day. 2)  Oxycodone-acetaminophen 10-325 Mg Tabs (Oxycodone-acetaminophen) .[name].. 1 every 4 hrs as needed for pain  Other Orders: Est. Patient Level IV (04540)  Patient Instructions: 1)  increase levemir to 300 units each am, and 150 units pm. 2)  check your blood sugar 2 times a day.  vary the time of day when you check, between before the 3 meals, and at bedtime.  also check if you have symptoms of your blood sugar being too high or too low.  please keep a record of the readings and bring it to your next appointment here.  please call us sooner if you are having low blood  sugar episodes. 3)  Please schedule a follow-up appointment in 1 month. 4)  change vicodin to percocet 10/350.  1/2-1 every 4 hrs as needed for pain. 5)  same medications for blood pressure for now.   Prescriptions: OXYCODONE-ACETAMINOPHEN 10-325 MG TABS (OXYCODONE-ACETAMINOPHEN) 1 every 4 hrs as needed for pain  #100 x 0   Entered and Authorized by:   Minus Breeding MD   Signed by:   Minus Breeding MD on 10/21/2009   Method used:   Print then Give to Patient   RxID:   [id number]

## 2010-05-21 NOTE — Assessment & Plan Note (Signed)
Summary: copd/apc   Visit Type:  Initial Consult Copy to:  Dr. Marca Ancona Primary Provider/Referring Provider:  Minus Breeding MD  CC:  Pt here for pulmonary consult. Pt c/o S.O.B with activity and @ rest x 2 to 3 years with intermittent chest pain.  History of Present Illness: 68/F smoker, obese diabetic for evaluation of dyspnea. She reports chest pains & dyspnea x 2 years on exertion, walking in the parking lot is enough to reproduce symptoms. She denies cough, wheeze, PND or orthopnea, frequent chest colds. Echo showed EF 55-60% with grade I diastlic dysfunction, cath planned for 1/20. CXR 03/18/09 no infiltrates or effusions. PFts 11/09 >> moderate airway obstruction, FEV1 54%, ratio 72 . Moderate restriction She admits to excessive daytime somnolence . Epworth Sleepiness Score 12/24. She reports loud nsoring & choking episodes that wake her up from sleep. She sleeps on her side with 2 pillows. Wakes up tired & frequent daytime naps. There is no history suggestive of cataplexy, sleep paralysis or parasomnias    Preventive Screening-Counseling & Management  Alcohol-Tobacco     Smoking Status: current     Packs/Day: <0.25     Year Started: 1955  Current Medications (verified): 1)  Hyzaar 100-25 Mg  Tabs (Losartan Potassium-Hctz) .... Take 1 By Mouth Qd 2)  Vytorin 10-80 Mg  Tabs (Ezetimibe-Simvastatin) .... Take 1 By Mouth Qd 3)  Furosemide 20 Mg  Tabs (Furosemide) .... Take 1 By Mouth Qd 4)  Hydrocodone-Acetaminophen 5-500 Mg  Tabs (Hydrocodone-Acetaminophen) .[name].. 1 By Mouth Three Times A Day As Needed 5)  Norvasc 2.5 Mg  Tabs (Amlodipine Besylate) .... Take 1 By Mouth Qd 6)  Klor-Con M20 20 Meq  Tbcr (Potassium Chloride Crys Cr) .... Take 1 By Mouth Qd 7)  Humalog Mix 75/25 Kwikpen 75-25 %  Susp (Insulin Lispro Prot & Lispro) .[name].. 120 Units Qam 8)  Screening Mammography 9)  Neurontin 100 Mg Caps (Gabapentin) .... Take 1 Q 8 Hours 10)  Cxr .[name].. 786.05 11)  Screening  Mammography 12)  Wellbutrin Sr 150 Mg Xr12h-Tab (Bupropion Hcl) .... One Daily For Three Days Then One Twice A Day 13)  Bd Pen Needle Short U/f 31g X 8 Mm Misc (Insulin Pen Needle) .... Use As Directed 14)  Truetrack Test  Strp (Glucose Blood) .... Use As Directed 15)  Aspir-Low 81 Mg Tbec (Aspirin) .... One Tablet Daily  Allergies (verified): 1)  ! * Actos 2)  ! Zestril  Past History:  Past Medical History: Last updated: 04/22/2009 1. Allergic rhinitis 2. Diabetes mellitus, type I 3. GERD 4. Osteoporosis 5. 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 6. Morbid obesity 7. Dyslipidemia 8. 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  9. Gallstones 10. Chronic dyspnea on exertion.   Family History: Last updated: 05/07/2009 mother had uncertain type of cancer  No early early CAD.   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   Alcohol Use - no  Past Surgical History: Appendectomy Cataract extraction Hysterectomy (no bso) 1980's Right Leg Broken @ 18 Cardiac Catherization (10/28/2003) Venous Doppler (09/09/2003) Stress Cardiolite (04/07/2006 EDG (10/12/2004) EKG (12/23/2004) Breast Surgery: 1986 or 1987 (breast reduction)  Family History: mother had uncertain type of cancer  No early early CAD.   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   Alcohol Use - no Packs/Day:  <0.25  Review of Systems       The patient complains of shortness of breath with activity, shortness of breath at rest, productive cough, non-productive cough, chest pain, loss of appetite,  weight change, headaches, nasal congestion/difficulty breathing through nose, itching, ear ache, hand/feet swelling, joint stiffness or pain, rash, change in color of mucus, and fever.  The patient denies coughing up blood, irregular heartbeats, acid heartburn, indigestion, abdominal pain, difficulty swallowing, sore throat, tooth/dental problems, sneezing, anxiety, and depression.    Vital Signs:  Patient profile:   70 year old female Height:      66 inches Weight:      383 pounds O2 Sat:      97 % on Room air Temp:     98.4 degrees F oral Pulse rate:   99 / minute BP sitting:   124 / 86  (left arm) Cuff size:   large  Vitals Entered By: Zackery Barefoot CMA (May 07, 2009 2:08 PM)  O2 Flow:  Room air CC: Pt here for pulmonary consult. Pt c/o S.O.B with activity and @ rest x 2 to 3 years with intermittent chest pain Comments Medications reviewed with patient Verified pt's contact number Zackery Barefoot CMA  May 07, 2009 2:10 PM    Physical Exam  Additional Exam:  Gen. Pleasant, obese, in no distress, normal affect ENT - no lesions, no post nasal drip Neck: No JVD, no thyromegaly, no carotid bruits Lungs: no use of accessory muscles, no dullness to percussion, decreased BL without rales or rhonchi  Cardiovascular: Rhythm regular, heart sounds  normal, no murmurs or gallops, no peripheral edema Abdomen: soft and non-tender, no hepatosplenomegaly, BS normal. 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  Schedule split polysomnogram. Orders: Consultation Level IV (81191) Sleep Disorder Referral (Sleep Disorder)  Medications Added to Medication List This Visit: 1)  Symbicort 160-4.5 Mcg/act Aero (Budesonide-formoterol fumarate) .... 2 puffs two times a day  Patient Instructions: 1)  Copy sent to:Dr Everardo All, Dr Shirlee Latch 2)  Please schedule a follow-up appointment in 1 month. 3)  Stay on symbicort 160/4.5 2 puffs  two times a day  4)  Smoking cessation - continue bupropion tabs x 3 months 5)  Try to quit completely 6)  Sleep study - you may need breathing machine during sleep. Prescriptions: SYMBICORT 160-4.5 MCG/ACT AERO (BUDESONIDE-FORMOTEROL FUMARATE) 2 puffs two times a day  #1 x 3   Entered and Authorized by:   Comer Locket. Vassie Loll MD   Signed by:   Comer Locket Vassie Loll MD on 05/07/2009   Method used:   Electronically to        [facility] [address] # [phone]* (retail)  [address]       [place], [place]  [address]       Ph: [phone]       Fax: [phone]   RxID:   [id number]     Appended Document: Orders Update  discussed PSG >> severe obstructive sleep apnea corrected by CPAP 18 cm , med full face mask, heated humidity, download in 2-4 wks   Clinical Lists Changes  Orders: Added new Referral order of DME Referral (DME) - Signed      Appended Document: copd/apc pl arrange oV post CPAP - she does not sem to be using her CPAP  Appended Document: copd/apc pt scheduled for follow up August 07, 2009 @ 2:45pm

## 2010-05-21 NOTE — Assessment & Plan Note (Signed)
Summary: 2 MTH FU  STC   Vital Signs:  Patient profile:   70 year old female Height:      66 inches (167.64 cm) Weight:      339.25 pounds (154.20 kg) BMI:     54.95 O2 Sat:      91 % on Room air Temp:     98.5 degrees F (36.94 degrees C) oral Pulse rate:   105 / minute Pulse rhythm:   regular BP sitting:   136 / 72  (left arm) Cuff size:   large  Vitals Entered By: Brenton Grills CMA Duncan Dull) (May 05, 2010 10:01 AM)  O2 Flow:  Room air CC: 2 month F/U/refill on Oxycodone-APAP/flu shot today/aj Is Patient Diabetic? Yes Comments Pt is due for mammogram and pap   Referring Provider:  Dr. Marca Ancona Primary Provider:  Minus Breeding MD  CC:  2 month F/U/refill on Oxycodone-APAP/flu shot today/aj.  History of Present Illness: the status of at least 3 ongoing medical problems is addressed today: dm: she brings a record of her cbg's which i have reviewed today.  it varies from 150-500, with no trend throughout the day.  she has lost a few lbs, due to her efforts.   smoker: she wants to quit. oa: right knee is very painful.  she saw ortho, who rec tkr.  Current Medications (verified): 1)  Hyzaar 100-25 Mg  Tabs (Losartan Potassium-Hctz) .... Take 1 By Mouth Qd 2)  Furosemide 20 Mg  Tabs (Furosemide) .... Take 1 By Mouth Qd 3)  Norvasc 2.5 Mg  Tabs (Amlodipine Besylate) .... Take 1 By Mouth Qd 4)  Klor-Con M20 20 Meq  Tbcr (Potassium Chloride Crys Cr) .... Take 1 By Mouth Qd 5)  Bd Pen Needle Short U/f 31g X 8 Mm Misc (Insulin Pen Needle) .... Use As Directed 6)  Truetrack Test  Strp (Glucose Blood) .... Use As Directed 7)  Aspir-Low 81 Mg Tbec (Aspirin) .... One Tablet Daily 8)  Levemir Flexpen 100 Unit/ml Soln (Insulin Detemir) .... 350 Units Each Am, and 150 Units in The Evening. 9)  Symbicort 160-4.5 Mcg/act  Aero (Budesonide-Formoterol Fumarate) .... Two Puffs Twice Daily 10)  Oxycodone-Acetaminophen 10-325 Mg Tabs (Oxycodone-Acetaminophen) .[name].. 1 Every 4 Hrs As Needed  For Pain 11)  Water Physical Therapy Eval .... 438.20 12)  Levetiracetam 500 Mg Tabs (Levetiracetam) .[name].. 1 Tablet By Mouth Two Times A Day 13)  Simvastatin 40 Mg Tabs (Simvastatin) .[name].. 1 Tab Once Daily 14)  Ciprofloxacin Hcl 500 Mg Tabs (Ciprofloxacin Hcl) .[name].. 1 Tablet By Mouth Two Times A Day For 7 Days 15)  Metoclopramide Hcl 10 Mg Tabs (Metoclopramide Hcl) .[name].. 1 Tablet By Mouth Every 6 Hours As Needed For Nausea  Allergies (verified): 1)  ! * Actos 2)  ! Zestril 3)  ! Chantix (Varenicline Tartrate)  Past History:  Past Medical History: Last updated: 01/06/2010 5. 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 6. Morbid obesity 8. 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  9. Gallstones 10. Chronic dyspnea on exertion.  Echo (1/11) showed EF 55-60%, mild diastolic dysfunction, valves ok, unable to estimate PA systolic pressure.  11. Chest pain: myoview (11/10) was suggestive of anterior ischemia.  Left heart cath from the right radial artery showed no angiographic coronary disease in 1/11.  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    Physical Exam  General:   morbidly obese.  no distress.  in wheelchair Extremities:  right knee:  normal except for slight anterior tenderness   Impression & Recommendations:  Problem # 1:  DIABETES MELLITUS, TYPE I (ICD-250.01) needs increased rx  Problem # 2:  KNEE PAIN, RIGHT (ICD-719.46) due to oa  Problem # 3:  SMOKER (ICD-305.1) wants to quit  Medications Added to Medication List This Visit: 1)  Levemir Flexpen 100 Unit/ml Soln (Insulin detemir) .... 375 units each am, and 175 units in the evening, and pen needsles two times a day 2)  Ciprofloxacin Hcl 500 Mg Tabs (Ciprofloxacin hcl) .[name].. 1 tablet by mouth two times a day for 7 days 3)  Metoclopramide Hcl 10 Mg Tabs (Metoclopramide hcl) .[name].. 1 tablet by mouth every 6 hours as needed for nausea 4)  Budeprion Xl 150 Mg Xr24h-tab (Bupropion hcl) .[name].. 1 tab each am  Other Orders: Est. Patient Level IV (04540)  Patient Instructions: 1)  increase levemir to 375 units each am, and 175 units in the evening. 2)  check your blood sugar 2 times a day.  vary the time of day when you check, between before the 3 meals, and at bedtime.  also check if you have symptoms of your blood sugar being too high or too low.  please keep a record of the readings and bring it to your next appointment here.  please call us sooner if you are having low blood sugar episodes.   3)  Please schedule a "medicare wellness" appointment.  at that time, i hape to be able to clear you for the right knee replacement surgery. 4)  take budeprion-xl, 150 mg each am, to help you quit smoking. Prescriptions: BUDEPRION XL 150 MG XR24H-TAB (BUPROPION HCL) 1 tab each am  #30 x 2   Entered and Authorized by:   Minus Breeding MD   Signed by:   Minus Breeding MD on 05/05/2010   Method used:   Electronically to        [facility] [address] [phone]* (retail)       [address]       [address]       [place], [place]  [address]       Ph: [phone]       Fax: [phone]   RxID:    [id number] OXYCODONE-ACETAMINOPHEN 10-325 MG TABS (OXYCODONE-ACETAMINOPHEN) 1 every 4 hrs as needed for pain  #120 x 0   Entered and Authorized by:   Minus Breeding MD   Signed by:   Minus Breeding MD on 05/05/2010   Method used:   Print then Give to Patient   RxID:   [id number] LEVEMIR FLEXPEN 100 UNIT/ML SOLN (INSULIN DETEMIR) 375 units each am, and 175 units in the evening, and pen needsles two times a day  #13 boxes x 3   Entered and Authorized by:   Minus Breeding MD   Signed by:   Minus Breeding MD on 05/05/2010   Method used:   Electronically to        [facility] [address] #  [id number]* (retail)       [address]       [address]       [place], [place]  [address]       Ph: [phone]       Fax: [phone]   RxID:   [id number]    Orders Added: 1)  Est. Patient Level IV [36644]

## 2010-05-21 NOTE — Progress Notes (Signed)
Summary: [facility]  Phone Note [employer] of Call: Faxed completed paperwork to [facility] and sent a copy to be scanned. Initial call taken by: Josph Macho RMA,  Sep 09, 2009 9:06 AM

## 2010-05-21 NOTE — Assessment & Plan Note (Signed)
Summary: np6/chest pain  Medications Added CHANTIX STARTING MONTH PAK 0.5 MG X 11 & 1 MG X 42 TABS (VARENICLINE TARTRATE) as dir.  refill with continuing packs HOLD per Pt. with she has( ?) WELLBUTRIN SR 150 MG XR12H-TAB (BUPROPION HCL) one daily for three days then one twice a day ASPIR-LOW 81 MG TBEC (ASPIRIN) one tablet daily        Visit Type:  np6/chest pain Primary Provider:  Dr. Everardo All   History of Present Illness: 70 yo with history morbid obesity, prior stroke, diabetes, HTN, smoking presents for evaluation of chest pain and dyspnea.  Patient had an episode of severe chest tightness and shortness of breath while carrying groceries into her house in 10/10. It lasted for 5-10 minutes and resolved with rest.  With ambulation, she gets mild chest tightness associated with dyspnea.  This has been going on for a long time.  She has had severe exertional dyspnea for up to 20 years.  She is morbidly obese and walks with a cane.  She is short of breath just walking in her house.  She uses a scooter to get around outside the house.  Patient sleeps on 2 pillows.  She snores and night and her son says she makes choking noises.  She has significant daytime sleepiness.    Patient had a Lexiscan myoview in 11/10 given her chest pain.  This showed EF 51%, slight reversible anterior and septal defect of borderline significance.  Given her body habitus, this certainly could possibly have been due to shifting breast attenuation.   ECG: NSR, normal  Current Medications (verified): 1)  Hyzaar 100-25 Mg  Tabs (Losartan Potassium-Hctz) .... Take 1 By Mouth Qd 2)  Vytorin 10-80 Mg  Tabs (Ezetimibe-Simvastatin) .... Take 1 By Mouth Qd 3)  Furosemide 20 Mg  Tabs (Furosemide) .... Take 1 By Mouth Qd 4)  Hydrocodone-Acetaminophen 5-500 Mg  Tabs (Hydrocodone-Acetaminophen) .[name].. 1 By Mouth Three Times A Day As Needed 5)  Norvasc 2.5 Mg  Tabs (Amlodipine Besylate) .... Take 1 By Mouth Qd 6)  Klor-Con M20 20  Meq  Tbcr (Potassium Chloride Crys Cr) .... Take 1 By Mouth Qd 7)  Humalog Mix 75/25 Kwikpen 75-25 %  Susp (Insulin Lispro Prot & Lispro) .Marland KitchenMarland KitchenMarland Kitchen 170 Units Qam 8)  Screening Mammography 9)  Neurontin 100 Mg Caps (Gabapentin) .... Take 1 Q 8 Hours 10)  Cxr .[name].. 786.05 11)  Screening Mammography 12)  Wellbutrin Sr 150 Mg Xr12h-Tab (Bupropion Hcl) .... One Daily For Three Days Then One Twice A Day 13)  Bd Pen Needle Short U/f 31g X 8 Mm Misc (Insulin Pen Needle) .... Use As Directed 14)  Truetrack Test  Strp (Glucose Blood) .... Use As Directed 15)  Aspir-Low 81 Mg Tbec (Aspirin) .... One Tablet Daily  Allergies: 1)  ! * Actos 2)  ! Zestril  Past History:  Past Medical History: 1. Allergic rhinitis 2. Diabetes mellitus, type I 3. GERD 4. Osteoporosis 5. 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 6. Morbid obesity 7. Dyslipidemia 8. 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  9. Gallstones 10. Chronic dyspnea on exertion.   Family History: mother had uncertain type of cancer  No early early CAD.  Mother had MI at 13.   Social History: widowed 1999 retired History of tobacco use: still smokes 5-6 cigs/day up to 1 ppd; he has been smoking since he was 48.   Alcohol Use - no  Review of Systems  All systems reviewed and negative except as per HPI.   Vital Signs:  Patient profile:   70 year old female Height:      66 inches Weight:      380 pounds Pulse rate:   94 / minute BP sitting:   119 / 79  (left arm) Cuff size:   large  Vitals Entered By: Oswald Hillock (April 22, 2009 11:45 AM)  Physical Exam  General:  morbidly obese.   Head:  normocephalic and atraumatic Nose:  no deformity, discharge, inflammation, or lesions Mouth:  Teeth, gums and palate normal. Oral mucosa normal. Neck:  Thick neck, difficult to assess JVP. No masses, thyromegaly or abnormal cervical nodes. Lungs:  Slightly decreased breath sounds bilaterally.    Heart:  Heart sounds distant.  Chest non-tender; regular rate and rhythm, S1, S2 without murmurs, rubs or gallops. Carotid upstroke normal, no bruit.  Pedals normal pulses. Trace ankle edema.  Abdomen:  Bowel sounds positive; abdomen soft and non-tender without masses, organomegaly, or hernias noted. No hepatosplenomegaly.  Obese.  Msk:  Back normal, normal gait. Muscle strength and tone normal. Extremities:  No clubbing or cyanosis. Neurologic:  Alert and oriented x 3. Skin:  Intact without lesions or rashes. Psych:  Normal affect.   Impression & Recommendations:  Problem # 1:  CHEST PAIN (ICD-786.50) Patient had one severe episode of chest pain with exertion in the fall.  She gets mild chest tightness associated with dyspnea on occasion when she walks longer distances.  She had a myoview with possible mild anterior ischemia (though given habitus this could certainly have been due to attenuation).  Given her multiple risk factors (HTN, hyperlipidemia, smoking), I think that invasive evaluation for risk stratification would be reasonable.  I will set her up for a catheterization using radial access.  Start ASA 81 mg daily, continue ARB and Vytorin.   Problem # 2:  HYPERCHOLESTEROLEMIA (ICD-272.0) Check lipids/LFTs on statin.   Problem # 3:  DYSPNEA (ICD-786.05) Multifactorial.  A large part of this is certainly due to obesity and deconditioning.  Ischemic diastolic dysfunction could also play a role (hence left heart cath).  She may have a degree of COPD as well and needs to quit smoking.   I will also check an echo for LV function and BNP.    Problem # 4:  POSSIBLE OSA Patient has thick neck and screens positive for OSA.  I will have her followup with pulmonology for a sleep study evaluation and to assess for COPD.   Problem # 5:  HYPERTENSION (ICD-401.9) Reasonable BP control on current meds.    Problem # 6:  SMOKER (ICD-305.1) She had a rash with Chantix and nicotine patches have not  worked.  I will have her try Zyban as she wants to quit.    Other Orders: Cardiac Catheterization (Cardiac Cath) Pulmonary Referral (Pulmonary) Echocardiogram (Echo)  Patient Instructions: 1)  Your physician has recommended you make the following change in your medication:  2)  Start Welbutrin to help you stop smoking 3)  Take Aspirin 81 mg daily--this should be buffered or coated 4)  Your physician has requested that you have an echocardiogram.  Echocardiography is a painless test that uses sound waves to create images of your heart. It provides your doctor with information about the size and shape of your heart and how well your heart's chambers and valves are working.  This procedure takes approximately one hour. There are no restrictions for this procedure. 5)  You have been referred to pulmonary 6)  Your physician recommends that you return for a FASTING lipid profile/liver profile/BMP/BNP/CBC/PT---786.50 786.09 272.0   have this done 04-23-09 7)  Your physician has requested that you have a cardiac catheterization.  Cardiac catheterization is used to diagnose and/or treat various heart conditions. Doctors may recommend this procedure for a number of different reasons. The most common reason is to evaluate chest pain. Chest pain can be a symptom of coronary artery disease (CAD), and cardiac catheterization can show whether plaque is narrowing or blocking your heart's arteries. This procedure is also used to evaluate the valves, as well as measure the blood flow and oxygen levels in different parts of your heart.  For further information please visit [email or web address]  Please follow instruction sheet, as given. 8)  Your physician recommends that you schedule a follow-up appointment in: 1 month with Dr. Marca Ancona  Prescriptions: WELLBUTRIN SR 150 MG XR12H-TAB (BUPROPION HCL) one daily for three days then one twice a day  #30 x 2   Entered by:   Katina Dung, RN, BSN   Authorized by:    Marca Ancona, MD   Signed by:   Katina Dung, RN, BSN on 04/22/2009   Method used:   Electronically to        [facility] [address] # [phone]* (retail)       [address]       [place], [place]  [address]       Ph: [phone]       Fax: [phone]   RxID:   [id number]

## 2010-05-21 NOTE — Miscellaneous (Signed)
Summary: Orders/[facility]  Orders/[facility]   Imported By: Lester [place] 10/29/2009 09:55:08  _____________________________________________________________________  External Attachment:    Type:   Image     Comment:   External Document

## 2010-05-21 NOTE — Progress Notes (Signed)
Summary: Ortho appt.  Phone Note Outgoing Call   Summary of Call: Dr Everardo All, Pt had appt with Dr Rennis Chris ([facility]) 02/05/10@ 245pm, pt cancelled appt and rescheduled to 02/11/10 then cancelled that appt. I called pt said she would reschedule when she had somone she could depend on to drive for her. Initial call taken by: Dagoberto Reef,  February 12, 2010 10:05 AM

## 2010-05-21 NOTE — Miscellaneous (Signed)
Summary: Episode Summary/[facility]  Episode Summary/[facility]   Imported By: Sherian Rein 12/23/2009 13:23:29  _____________________________________________________________________  External Attachment:    Type:   Image     Comment:   External Document

## 2010-05-21 NOTE — Assessment & Plan Note (Signed)
Summary: PER CHRIST[facility]--STC   Vital Signs:  Patient profile:   70 year old female Height:      66 inches (167.64 cm) Weight:      379.25 pounds (172.39 kg) O2 Sat:      93 % on Room air Temp:     97.0 degrees F (36.11 degrees C) oral Pulse rate:   94 / minute BP sitting:   128 / 82  (left arm) Cuff size:   large  Vitals Entered By: Josph Macho CMA (April 25, 2009 8:03 AM)  O2 Flow:  Room air CC: Follow-up visit/ CF Is Patient Diabetic? Yes   Primary Provider:  Dr. Everardo All  CC:  Follow-up visit/ CF.  History of Present Illness: pt takes humalog mix 75/25, 60 units two times a day, instead of the prescribed 170 units each am.  no cbg record, but states cbg's vary from 69 (before breakfast) to 200's (afternoon). she is scheduled to have cardiac catherization soon.     Current Medications (verified): 1)  Hyzaar 100-25 Mg  Tabs (Losartan Potassium-Hctz) .... Take 1 By Mouth Qd 2)  Vytorin 10-80 Mg  Tabs (Ezetimibe-Simvastatin) .... Take 1 By Mouth Qd 3)  Furosemide 20 Mg  Tabs (Furosemide) .... Take 1 By Mouth Qd 4)  Hydrocodone-Acetaminophen 5-500 Mg  Tabs (Hydrocodone-Acetaminophen) .[name].. 1 By Mouth Three Times A Day As Needed 5)  Norvasc 2.5 Mg  Tabs (Amlodipine Besylate) .... Take 1 By Mouth Qd 6)  Klor-Con M20 20 Meq  Tbcr (Potassium Chloride Crys Cr) .... Take 1 By Mouth Qd 7)  Humalog Mix 75/25 Kwikpen 75-25 %  Susp (Insulin Lispro Prot & Lispro) .Marland KitchenMarland KitchenMarland Kitchen 170 Units Qam 8)  Screening Mammography 9)  Neurontin 100 Mg Caps (Gabapentin) .... Take 1 Q 8 Hours 10)  Cxr .[name].. 786.05 11)  Screening Mammography 12)  Wellbutrin Sr 150 Mg Xr12h-Tab (Bupropion Hcl) .... One Daily For Three Days Then One Twice A Day 13)  Bd Pen Needle Short U/f 31g X 8 Mm Misc (Insulin Pen Needle) .... Use As Directed 14)  Truetrack Test  Strp (Glucose Blood) .... Use As Directed 15)  Aspir-Low 81 Mg Tbec (Aspirin) .... One Tablet Daily  Allergies (verified): 1)  ! * Actos 2)  ! Zestril  Past  History:  Past Medical History: Last updated: 04/22/2009 1. Allergic rhinitis 2. Diabetes mellitus, type I 3. GERD 4. Osteoporosis 5. 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 6. Morbid obesity 7. Dyslipidemia 8. 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  9. Gallstones 10. Chronic dyspnea on exertion.   Review of Systems  The patient denies syncope.    Physical Exam  General:  morbidly obese.  no distress Extremities:  trace right pedal edema and trace left pedal edema.     Impression & Recommendations:  Problem # 1:  DIABETES MELLITUS, TYPE I (ICD-250.01) therapy limited by noncompliance.  i'll do the best i can.  Medications Added to Medication List This Visit: 1)  Humalog Mix 75/25 Kwikpen 75-25 % Susp (Insulin lispro prot & lispro) .[name].. 120 units qam  Other Orders: Est. Patient Level III ([address])  Patient Instructions: 1)  for now, change humalog 75/25 to 120 units each am.   2)  check your blood sugar 2 times a day.  vary the time of day when you check, between before the 3 meals, and at bedtime.  also check if you have symptoms of your blood sugar being too high or too  low.  please keep a record of the readings and bring it to your next appointment here.  please call us sooner if you are having low blood sugar episodes. 3)  Please schedule a follow-up appointment in 1 month.

## 2010-05-21 NOTE — Progress Notes (Signed)
Summary: [facility]  Phone Note Other Incoming   Summary of Call: Faxed completed paperwork to [facility] and sent a copy to be scanned. Initial call taken by: Josph Macho RMA,  Sep 04, 2009 4:48 PM

## 2010-05-21 NOTE — Assessment & Plan Note (Signed)
Summary: 4 WK FU  STC   Vital Signs:  Patient profile:   70 year old female Height:      66 inches (167.64 cm) Weight:      370.50 pounds (168.41 kg) BMI:     60.02 O2 Sat:      93 % on Room air Temp:     98.3 degrees F (36.83 degrees C) oral Pulse rate:   88 / minute Pulse rhythm:   regular BP sitting:   124 / 82  (left arm) Cuff size:   large  Vitals Entered By: Brenton Grills CMA Duncan Dull) (March 03, 2010 10:23 AM)  O2 Flow:  Room air CC: Follow-up visit/refill on Oxycodone-APAP/aj Is Patient Diabetic? Yes   Referring Provider:  Dr. Marca Ancona Primary Provider:  Minus Breeding MD  CC:  Follow-up visit/refill on Oxycodone-APAP/aj.  History of Present Illness: pt says she did not go to orthopedics because she did not have a ride.   no cbg record, but states cbg's are 200's, with no trend throughout the day.  it was 128, in the middle of the night.     Current Medications (verified): 1)  Hyzaar 100-25 Mg  Tabs (Losartan Potassium-Hctz) .... Take 1 By Mouth Qd 2)  Furosemide 20 Mg  Tabs (Furosemide) .... Take 1 By Mouth Qd 3)  Norvasc 2.5 Mg  Tabs (Amlodipine Besylate) .... Take 1 By Mouth Qd 4)  Klor-Con M20 20 Meq  Tbcr (Potassium Chloride Crys Cr) .... Take 1 By Mouth Qd 5)  Bd Pen Needle Short U/f 31g X 8 Mm Misc (Insulin Pen Needle) .... Use As Directed 6)  Truetrack Test  Strp (Glucose Blood) .... Use As Directed 7)  Aspir-Low 81 Mg Tbec (Aspirin) .... One Tablet Daily 8)  Levemir Flexpen 100 Unit/ml Soln (Insulin Detemir) .... 300 Units Each Am, and 175 Units in The Evening. 9)  Symbicort 160-4.5 Mcg/act  Aero (Budesonide-Formoterol Fumarate) .... Two Puffs Twice Daily 10)  Oxycodone-Acetaminophen 10-325 Mg Tabs (Oxycodone-Acetaminophen) .[name].. 1 Every 4 Hrs As Needed For Pain 11)  Water Physical Therapy Eval .... 438.20 12)  Levetiracetam 500 Mg Tabs (Levetiracetam) .[name].. 1 Tablet By Mouth Two Times A Day 13)  Simvastatin 40 Mg Tabs (Simvastatin) .[name].. 1 Tab Once  Daily  Allergies (verified): 1)  ! * Actos 2)  ! Zestril  Past History:  Past Medical History: Last updated: 01/06/2010 5. 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 6. Morbid obesity 8. 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  9. Gallstones 10. Chronic dyspnea on exertion.  Echo (1/11) showed EF 55-60%, mild diastolic dysfunction, valves ok, unable to estimate PA systolic pressure.  11. Chest pain: myoview (11/10) was suggestive of anterior ischemia.  Left heart cath from the right radial artery showed no angiographic coronary disease in 1/11.  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    Physical Exam  General:  morbidly obese.  no distress.  in wheelchair Extremities:  right knee:  normal except for slight anterior tenderness  Impression & Recommendations:  Problem # 1:  DIABETES MELLITUS, TYPE I (ICD-250.01) needs increased rx  Problem # 2:  KNEE PAIN, RIGHT (ICD-719.46) she needs to see ortho  Medications Added to Medication List This Visit: 1)  Levemir Flexpen 100 Unit/ml Soln (Insulin detemir) .... 350 units each am, and 150 units in the evening.  Other  Orders: Est. Patient Level III (60454)  Patient Instructions: 1)  increase levemir to 350 units each am, and 150 units in the evening. 2)  check your blood sugar 2 times a day.  vary the time of day when you check, between before the 3 meals, and at bedtime.  also check if you have symptoms of your blood sugar being too high or too low.  please keep a record of the readings and bring it to your next appointment here.  please call us sooner if you are having low blood sugar episodes.   3)  Please schedule a follow-up appointment in 2 months. 4)  please reschedule the orthopedics appointment asap.  use "scat," if necessary.   Prescriptions: OXYCODONE-ACETAMINOPHEN 10-325 MG TABS (OXYCODONE-ACETAMINOPHEN) 1 every 4 hrs as needed for pain  #120 x 0   Entered and Authorized by:   Minus Breeding MD   Signed by:   Minus Breeding MD on 03/03/2010   Method used:   Print then Give to Patient   RxID:   [id number]    Orders Added: 1)  Est. Patient Level III [30865]

## 2010-05-21 NOTE — Miscellaneous (Signed)
Summary: Orders/[facility]  Orders/[facility]   Imported By: Sherian Rein 10/23/2009 12:54:19  _____________________________________________________________________  External Attachment:    Type:   Image     Comment:   External Document

## 2010-05-21 NOTE — Letter (Signed)
Summary: Cardiac Catheterization Instructions- [facility]  [facility], Main Office  [address]   [place], [place] [address]   Phone: [phone]  Fax: [phone]     04/22/2009 MRN: [id number]  [name] [address] [place], [place]  [address]  Dear Ms. Brossman,   You are scheduled for a Cardiac Catheterization on Tuesday January 11,2011 with Dr. Marca Ancona  Please arrive to the 1st floor of the Heart and Vascular Center at [facility] at 11 am  on the day of your procedure. Please do not arrive before 6:30 a.m. Call the Heart and Vascular Center at [phone] if you are unable to make your appointmnet. The Code to get into the parking garage under the building is 0090. Take the elevators to the 1st floor. You must have someone to drive you home. Someone must be with you for the first 24 hours after you arrive home. Please wear clothes that are easy to get on and off and wear slip-on shoes. You may have clear liquids until 6am--then nothing to eat or drink after midnight. Bring all your medications and current insurance cards with you.  ___ DO NOT take these medications before your procedure: Take half of your usual insulin dose the morning of the test. You may take your other medications with a sip of water.  _x__ Make sure you take your aspirin.    The usual length of stay after your procedure is 2 to 3 hours. This can vary.  If you have any questions, please call the office at the number listed above.   Katina Dung, RN, BSN

## 2010-05-21 NOTE — Progress Notes (Signed)
Summary: [facility]  Phone Note [employer] of Call: Faxed completed paperwork to [facility] and sent a copy to be scanned. Initial call taken by: Josph Macho RMA,  Sep 12, 2009 2:01 PM

## 2010-05-21 NOTE — Consult Note (Signed)
Summary: [facility]   Imported By: Lester Ely 04/29/2010 12:26:55  _____________________________________________________________________  External Attachment:    Type:   Image     Comment:   External Document

## 2010-05-21 NOTE — Progress Notes (Signed)
Summary: reschedule cath   Phone Note Other Incoming   Caller: Dr Shirlee Latch Summary of Call: reschedule cath to 1/20   Follow-up for Phone Call        Dr Shirlee Latch would like to reshedule cath to 05/08/09 in the main lab LMVM for pt Katina Dung, RN, BSN  April 23, 2009 3:14 PM  I have rescheduled cath from 04-29-09 to 05-08-09 in the Main lab at 10:30--I have left message for pt to call me back to discuss with pt Katina Dung, RN, BSN  April 23, 2009 3:43 PM       Appended Document: reschedule cath talked with patient --rescheduled cath to 05-08-09 in the main lab--pt to come for BMP/CBC/PT on 05-05-09   Clinical Lists Changes

## 2010-05-21 NOTE — Assessment & Plan Note (Signed)
Summary: 4 WK FU--STC   Vital Signs:  Patient profile:   70 year old female Height:      66 inches (167.64 cm) Weight:      370 pounds (168.18 kg) BMI:     59.94 O2 Sat:      96 % on Room air Temp:     98.6 degrees F (37.00 degrees C) oral Pulse rate:   89 / minute BP sitting:   132 / 82  (left arm) Cuff size:   regular (wrist)  Vitals Entered By: Brenton Grills MA (February 03, 2010 10:31 AM)  O2 Flow:  Room air CC: Follow-up visit/refills on medications/aj Is Patient Diabetic? Yes   Referring Anzel Kearse:  Dr. Marca Ancona Primary Hayven Fatima:  Minus Breeding MD  CC:  Follow-up visit/refills on medications/aj.  History of Present Illness: no cbg record, but states cbg's are improved.  it varies from 120-500.  it is in general later in the day. pt had a fall recently, which increased her need for the percocet.  since then, she has 2 mos of right knee pain, and assoc numbness.  Current Medications (verified): 1)  Hyzaar 100-25 Mg  Tabs (Losartan Potassium-Hctz) .... Take 1 By Mouth Qd 2)  Furosemide 20 Mg  Tabs (Furosemide) .... Take 1 By Mouth Qd 3)  Norvasc 2.5 Mg  Tabs (Amlodipine Besylate) .... Take 1 By Mouth Qd 4)  Klor-Con M20 20 Meq  Tbcr (Potassium Chloride Crys Cr) .... Take 1 By Mouth Qd 5)  Bd Pen Needle Short U/f 31g X 8 Mm Misc (Insulin Pen Needle) .... Use As Directed 6)  Truetrack Test  Strp (Glucose Blood) .... Use As Directed 7)  Aspir-Low 81 Mg Tbec (Aspirin) .... One Tablet Daily 8)  Levemir Flexpen 100 Unit/ml Soln (Insulin Detemir) .... 250 Units Each Am, and 200 Units in The Evening. 9)  Symbicort 160-4.5 Mcg/act  Aero (Budesonide-Formoterol Fumarate) .... Two Puffs Twice Daily 10)  Oxycodone-Acetaminophen 10-325 Mg Tabs (Oxycodone-Acetaminophen) .[name].. 1 Every 4 Hrs As Needed For Pain 11)  Water Physical Therapy Eval .... 438.20 12)  Levetiracetam 500 Mg Tabs (Levetiracetam) .[name].. 1 Tablet By Mouth Two Times A Day 13)  Simvastatin 40 Mg Tabs (Simvastatin)  .[name].. 1 Tab Once Daily  Allergies (verified): 1)  ! * Actos 2)  ! Zestril  Past History:  Past Medical History: Last updated: 01/06/2010 5. 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 6. Morbid obesity 8. 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  9. Gallstones 10. Chronic dyspnea on exertion.  Echo (1/11) showed EF 55-60%, mild diastolic dysfunction, valves ok, unable to estimate PA systolic pressure.  11. Chest pain: myoview (11/10) was suggestive of anterior ischemia.  Left heart cath from the right radial artery showed no angiographic coronary disease in 1/11.  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    Physical Exam  General:  morbidly obese.  no distress  in wheelchair Extremities:  right knee:  normal except for slight anterior tenderness  Impression & Recommendations:  Problem # 1:  DIABETES MELLITUS, TYPE I (ICD-250.01) needs increased rx  Problem # 2:  KNEE PAIN, RIGHT (ICD-719.46) uncertain etiology. the controlled substances contract is completed  Medications Added to Medication List This Visit: 1)  Levemir Flexpen 100 Unit/ml Soln (Insulin  detemir) .... 300 units each am, and 175 units in the evening.  Other Orders: Orthopedic Surgeon Referral (Ortho Surgeon) Est. Patient Level IV (19147)  Patient Instructions: 1)  increase levemir to 300 units each am, and 175 units in the evening. 2)  check your blood sugar 2 times a day.  vary the time of day when you check, between before the 3 meals, and at bedtime.  also check if you have symptoms of your blood sugar being too high or too low.  please keep a record of the readings and bring it to your next appointment here.  please call us sooner if you are having low blood sugar episodes. 3)  Please schedule a follow-up appointment in 4 weeks. 4)  here is a refill for the percocet. 5)  refer orthopedics.  you will be called with a day and time for an appointment. Prescriptions: OXYCODONE-ACETAMINOPHEN 10-325 MG TABS (OXYCODONE-ACETAMINOPHEN) 1 every 4 hrs as needed for pain  #120 x 0   Entered and Authorized by:   Minus Breeding MD   Signed by:   Minus Breeding MD on 02/03/2010   Method used:   Print then Mail to Patient   RxID:   [id number] LEVEMIR FLEXPEN 100 UNIT/ML SOLN (INSULIN DETEMIR) 300 units each am, and 175 units in the evening.  #11 boxes x 11   Entered and Authorized by:   Minus Breeding MD   Signed by:   Minus Breeding MD on 02/03/2010   Method used:   Electronically to        [facility] Dr[address] [phone]* (retail)       [address]       [address]       [place], [place]  [address]       Ph: [phone]       Fax: [phone]   RxID:   [id number]    Orders Added: 1)  Orthopedic Surgeon Referral Gaylord Shih Surgeon] 2)  Est. Patient Level IV [18841]

## 2010-05-21 NOTE — Progress Notes (Signed)
Summary: [facility]  Phone Note Other Incoming   Summary of Call: Received paperwork from [facility]. Put on MD's desk. Initial call taken by: Josph Macho CMA,  May 05, 2009 8:35 AM     Appended Document: [facility] Faxed completed paperwork and sent a copy to be scanned.

## 2010-05-21 NOTE — Progress Notes (Signed)
  Phone Note Outgoing Call   Call placed by: Zackery Barefoot CMA,  October 03, 2009 3:17 PM Summary of Call: reviewed download 4/27-5/25/11 >> very poor usage  - let her know that I will send  a Rx to dc CPAP since she is not using this anyways Signed by Comer Locket. Vassie Loll MD on 10/03/2009 at 3:08 PM    Follow-up for Phone Call        Advised RA of above, he recs have pt come in one month for follow up with download. Zackery Barefoot CMA  October 03, 2009 3:22 PM   order faxed to [place] [facility] to do download in 4wks and pt to see dr Vassie Loll 11/10/09@1 :45pm pt aware Oneita Jolly  October 03, 2009 3:54 PM

## 2010-05-21 NOTE — Letter (Signed)
Summary: CMN for [employer]/[facility]  CMN for [facility]   Imported By: Sherian Rein 09/08/2009 13:40:28  _____________________________________________________________________  External Attachment:    Type:   Image     Comment:   External Document

## 2010-05-21 NOTE — Miscellaneous (Signed)
Summary: Controlled Substances Contact  Controlled Substances Contact   Imported By: Esmeralda Links D'jimraou 02/07/2010 10:18:43  _____________________________________________________________________  External Attachment:    Type:   Image     Comment:   External Document

## 2010-05-21 NOTE — Cardiovascular Report (Signed)
Summary: Pre Op Orders  Pre Op Orders   Imported By: Roderic Ovens 05/05/2009 10:41:54  _____________________________________________________________________  External Attachment:    Type:   Image     Comment:   External Document

## 2010-05-21 NOTE — Assessment & Plan Note (Signed)
Summary: post hosp/cd   Vital Signs:  Patient profile:   70 year old female Height:      66 inches (167.64 cm) Weight:      371.13 pounds (168.70 kg) BMI:     60.12 O2 Sat:      91 % on Room air Temp:     98.8 degrees F (37.11 degrees C) oral Pulse rate:   80 / minute BP sitting:   132 / 82  (right arm) Cuff size:   regular (wrist)  Vitals Entered By: Brenton Grills MA (January 06, 2010 1:41 PM)  O2 Flow:  Room air CC: [facility] F/U/generic alternative to Vytorin/aj Is Patient Diabetic? Yes   Referring Bayley Hurn:  Dr. Marca Ancona Primary Javaun Dimperio:  Minus Breeding MD  CC:  [facility] F/U/generic alternative to Vytorin/aj.  History of Present Illness: the status of at least 3 ongoing medical problems is addressed today: seizures:  she says she continues to have these episodes, despite the medication given by dr Anne Hahn.   dm:  no cbg record, but states cbg's vary from 119-250.  it is in general highest in am.  diet is , and exercise is severely limited by medical problems.   dyslipidemia:  pt wants cheaper med.  denies weight gain.  Current Medications (verified): 1)  Hyzaar 100-25 Mg  Tabs (Losartan Potassium-Hctz) .... Take 1 By Mouth Qd 2)  Vytorin 10-80 Mg  Tabs (Ezetimibe-Simvastatin) .... Take 1 By Mouth Qd 3)  Furosemide 20 Mg  Tabs (Furosemide) .... Take 1 By Mouth Qd 4)  Norvasc 2.5 Mg  Tabs (Amlodipine Besylate) .... Take 1 By Mouth Qd 5)  Klor-Con M20 20 Meq  Tbcr (Potassium Chloride Crys Cr) .... Take 1 By Mouth Qd 6)  Bd Pen Needle Short U/f 31g X 8 Mm Misc (Insulin Pen Needle) .... Use As Directed 7)  Truetrack Test  Strp (Glucose Blood) .... Use As Directed 8)  Aspir-Low 81 Mg Tbec (Aspirin) .... One Tablet Daily 9)  Levemir Flexpen 100 Unit/ml Soln (Insulin Detemir) .... 250 Units Each Am, and 175 Units in The Evening. 10)  Symbicort 160-4.5 Mcg/act  Aero (Budesonide-Formoterol Fumarate) .... Two Puffs Twice Daily 11)  Oxycodone-Acetaminophen 10-325  Mg Tabs (Oxycodone-Acetaminophen) .[name].. 1 Every 4 Hrs As Needed For Pain 12)  Water Physical Therapy Eval .... 438.20 13)  Levetiracetam 500 Mg Tabs (Levetiracetam) .[name].. 1 Tablet By Mouth Two Times A Day  Allergies (verified): 1)  ! * Actos 2)  ! Zestril  Past History:  Past Medical History: 5. 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 6. Morbid obesity 8. 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  9. Gallstones 10. Chronic dyspnea on exertion.  Echo (1/11) showed EF 55-60%, mild diastolic dysfunction, valves ok, unable to estimate PA systolic pressure.  11. Chest pain: myoview (11/10) was suggestive of anterior ischemia.  Left heart cath from the right radial artery showed no angiographic coronary disease in 1/11.  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  headache:  no change  Physical Exam  General:  morbidly obese.  no distress  Psych:  depressed affect.   Additional Exam:  outside test results are reviewed:  a1c in hosp was 10.6   Impression & Recommendations:  Problem # 1:  DIABETES MELLITUS, TYPE I (ICD-250.01) needs increased rx  Problem # 2:   SEIZURE DISORDER (ICD-780.39) Assessment: Unchanged  Problem # 3:  HYPERCHOLESTEROLEMIA (ICD-272.0) pt wants cheaper med  Medications Added to Medication List This Visit: 1)  Levemir Flexpen 100 Unit/ml Soln (Insulin detemir) .... 250 units each am, and 200 units in the evening. 2)  Levetiracetam 500 Mg Tabs (Levetiracetam) .[name].. 1 tablet by mouth two times a day 3)  Simvastatin 40 Mg Tabs (Simvastatin) .[name].. 1 tab once daily  Other Orders: Neurology Referral (Neuro) Est. Patient Level IV (29562) Diabetic Clinic Referral (Diabetic)  Patient Instructions: 1)  refer dr Anne Hahn for follow-up appointment.  you will be called with a day and time for an appointment 2)  increase levemir to 250 units each am, and 200 units in the evening. 3)  check your blood sugar 2 times a day.  vary the time of day when you check, between before the 3 meals, and at bedtime.  also check if you have symptoms of your blood sugar being too high or too low.  please keep a record of the readings and bring it to your next appointment here.  please call us sooner if you are having low blood sugar episodes. 4)  Please schedule a follow-up appointment in 4 weeks. 5)  change vytorin to simvastatin 40 mg once daily 6)  we'll recheck thyroid with next blood tests.  Prescriptions: SIMVASTATIN 40 MG TABS (SIMVASTATIN) 1 tab once daily  #30 x 11   Entered and Authorized by:   Minus Breeding MD   Signed by:   Minus Breeding MD on 01/06/2010   Method used:   Electronically to        [facility] [address] [phone]* (retail)       [address]       [place], [place]  [address]       Ph: [phone] or [phone]       Fax: [phone]   RxID:   [id number]

## 2010-05-21 NOTE — Assessment & Plan Note (Signed)
Summary: f/u appt/#/cd   Vital Signs:  Patient profile:   70 year old female Height:      66 inches (167.64 cm) Weight:      385 pounds (175 kg) O2 Sat:      97 % on Room air Temp:     97.0 degrees F (36.11 degrees C) oral Pulse rate:   97 / minute BP sitting:   142 / 84  (left arm) Cuff size:   large  Vitals Entered By: Josph Macho RMA (June 25, 2009 11:30 AM)  O2 Flow:  Room air CC: Follow-up visit/ CF Is Patient Diabetic? Yes   Referring Provider:  Dr. Marca Ancona Primary Provider:  Minus Breeding MD  CC:  Follow-up visit/ CF.  History of Present Illness: no cbg record, but states cbg's are sometimes low in the middle of the night.  she says it is as high as 400 in the afternoon.  pt states she feels well in general.  Current Medications (verified): 1)  Hyzaar 100-25 Mg  Tabs (Losartan Potassium-Hctz) .... Take 1 By Mouth Qd 2)  Vytorin 10-80 Mg  Tabs (Ezetimibe-Simvastatin) .... Take 1 By Mouth Qd 3)  Furosemide 20 Mg  Tabs (Furosemide) .... Take 1 By Mouth Qd 4)  Hydrocodone-Acetaminophen 5-500 Mg  Tabs (Hydrocodone-Acetaminophen) .[name].. 1 By Mouth Three Times A Day As Needed 5)  Norvasc 2.5 Mg  Tabs (Amlodipine Besylate) .... Take 1 By Mouth Qd 6)  Klor-Con M20 20 Meq  Tbcr (Potassium Chloride Crys Cr) .... Take 1 By Mouth Qd 7)  Humalog Mix 75/25 Kwikpen 75-25 %  Susp (Insulin Lispro Prot & Lispro) .[name].. 120 Units Qam 8)  Neurontin 100 Mg Caps (Gabapentin) .... Take 1 Q 8 Hours 9)  Wellbutrin Sr 150 Mg Xr12h-Tab (Bupropion Hcl) .... One Daily For Three Days Then One Twice A Day 10)  Bd Pen Needle Short U/f 31g X 8 Mm Misc (Insulin Pen Needle) .... Use As Directed 11)  Truetrack Test  Strp (Glucose Blood) .... Use As Directed 12)  Aspir-Low 81 Mg Tbec (Aspirin) .... One Tablet Daily 13)  Chantix Starting Month Pak 0.5 Mg X 11 & 1 Mg X 42 Tabs (Varenicline Tartrate) .... Use As Directed  Allergies (verified): 1)  ! * Actos 2)  ! Zestril  Past History:  Past  Medical History: Last updated: 05/20/2009 1. Allergic rhinitis 2. Diabetes mellitus, type I 3. GERD 4. Osteoporosis 5. 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 6. Morbid obesity 7. Dyslipidemia 8. 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  9. Gallstones 10. Chronic dyspnea on exertion.  Echo (1/11) showed EF 55-60%, mild diastolic dysfunction, valves ok, unable to estimate PA systolic pressure.  11. Chest pain: myoview (11/10) was suggestive of anterior ischemia.  Left heart cath from the right radial artery showed no angiographic coronary disease in 1/11.   Review of Systems  The patient denies syncope.    Physical Exam  General:  morbidly obese.  no distress. Pulses:  dorsalis pedis absent bilat, prob due to edema.    Extremities:  no deformity.  no ulcer on the feet.  feet are of normal color and temp.  2+ right pedal edema and 2+ left pedal edema.   Neurologic:  sensation is intact to touch on the feet    Impression & Recommendations:  Problem # 1:  DIABETES MELLITUS, TYPE I (ICD-250.01) she may do better with a simpler regimen  Medications Added to Medication  List This Visit: 1)  Levemir Flexpen 100 Unit/ml Soln (Insulin detemir) .[name].. 110 units each am  Other Orders: Prescription Created Electronically [phone]) Est. Patient Level III (98119)  Patient Instructions: 1)  change humalog 75/25 to levemir 110 units each am.   2)  check your blood sugar 2 times a day.  vary the time of day when you check, between before the 3 meals, and at bedtime.  also check if you have symptoms of your blood sugar being too high or too low.  please keep a record of the readings and bring it to your next appointment here.  please call us sooner if you are having low blood sugar episodes. 3)  Please schedule a follow-up appointment in 1 month. Prescriptions: LEVEMIR FLEXPEN 100 UNIT/ML SOLN (INSULIN DETEMIR) 110 units each am  #3 boxes x 11   Entered and  Authorized by:   Minus Breeding MD   Signed by:   Minus Breeding MD on 06/25/2009   Method used:   Electronically to        [facility] # [phone]* (retail)       [address]       [place], [place]  [address]       Ph: [phone]       Fax: [phone]   RxID:   [id number]

## 2010-06-23 ENCOUNTER — Other Ambulatory Visit: Payer: BC Managed Care – PPO

## 2010-06-23 ENCOUNTER — Ambulatory Visit (INDEPENDENT_AMBULATORY_CARE_PROVIDER_SITE_OTHER): Payer: Medicare Other | Admitting: Endocrinology

## 2010-06-23 ENCOUNTER — Encounter: Payer: Self-pay | Admitting: Endocrinology

## 2010-06-23 ENCOUNTER — Other Ambulatory Visit: Payer: Self-pay | Admitting: Endocrinology

## 2010-06-23 DIAGNOSIS — I10 Essential (primary) hypertension: Secondary | ICD-10-CM

## 2010-06-23 DIAGNOSIS — R21 Rash and other nonspecific skin eruption: Secondary | ICD-10-CM

## 2010-06-23 DIAGNOSIS — L600 Ingrowing nail: Secondary | ICD-10-CM | POA: Insufficient documentation

## 2010-06-23 DIAGNOSIS — E871 Hypo-osmolality and hyponatremia: Secondary | ICD-10-CM

## 2010-06-23 DIAGNOSIS — Z0000 Encounter for general adult medical examination without abnormal findings: Secondary | ICD-10-CM

## 2010-06-23 DIAGNOSIS — E7800 Pure hypercholesterolemia, unspecified: Secondary | ICD-10-CM

## 2010-06-23 DIAGNOSIS — K7460 Unspecified cirrhosis of liver: Secondary | ICD-10-CM

## 2010-06-23 DIAGNOSIS — R109 Unspecified abdominal pain: Secondary | ICD-10-CM

## 2010-06-23 DIAGNOSIS — E785 Hyperlipidemia, unspecified: Secondary | ICD-10-CM

## 2010-06-23 DIAGNOSIS — E109 Type 1 diabetes mellitus without complications: Secondary | ICD-10-CM

## 2010-06-23 LAB — HEPATIC FUNCTION PANEL
AST: 17 U/L (ref 0–37)
Alkaline Phosphatase: 94 U/L (ref 39–117)
Bilirubin, Direct: 0.1 mg/dL (ref 0.0–0.3)
Total Bilirubin: 0.5 mg/dL (ref 0.3–1.2)

## 2010-06-23 LAB — URINALYSIS, ROUTINE W REFLEX MICROSCOPIC
Bilirubin Urine: NEGATIVE
Hgb urine dipstick: NEGATIVE
Nitrite: NEGATIVE
Total Protein, Urine: NEGATIVE

## 2010-06-23 LAB — CBC WITH DIFFERENTIAL/PLATELET
Basophils Absolute: 0 10*3/uL (ref 0.0–0.1)
Eosinophils Absolute: 0 10*3/uL (ref 0.0–0.7)
Hemoglobin: 14.3 g/dL (ref 12.0–15.0)
Lymphocytes Relative: 20 % (ref 12.0–46.0)
MCHC: 33.7 g/dL (ref 30.0–36.0)
Monocytes Relative: 7.5 % (ref 3.0–12.0)
Neutrophils Relative %: 71.7 % (ref 43.0–77.0)
RDW: 13.2 % (ref 11.5–14.6)

## 2010-06-23 LAB — LIPID PANEL: VLDL: 58.6 mg/dL — ABNORMAL HIGH (ref 0.0–40.0)

## 2010-06-23 LAB — MICROALBUMIN / CREATININE URINE RATIO
Creatinine,U: 227 mg/dL
Microalb Creat Ratio: 1.1 mg/g (ref 0.0–30.0)
Microalb, Ur: 2.6 mg/dL — ABNORMAL HIGH (ref 0.0–1.9)

## 2010-06-23 LAB — BASIC METABOLIC PANEL
CO2: 29 mEq/L (ref 19–32)
Calcium: 9 mg/dL (ref 8.4–10.5)
Creatinine, Ser: 0.9 mg/dL (ref 0.4–1.2)
GFR: 79.93 mL/min (ref >60.00)
Glucose, Bld: 279 mg/dL — ABNORMAL HIGH (ref 70–99)
Potassium: 4.7 mEq/L (ref 3.5–5.1)

## 2010-06-23 LAB — LDL CHOLESTEROL, DIRECT: Direct LDL: 94.1 mg/dL

## 2010-06-23 LAB — CONVERTED CEMR LAB: Calcium, Total (PTH): 8.9 mg/dL (ref 8.4–10.5)

## 2010-06-29 LAB — CBC
HCT: 42.8 % (ref 36.0–46.0)
Hemoglobin: 14.4 g/dL (ref 12.0–15.0)
MCH: 32.4 pg (ref 26.0–34.0)
MCHC: 33.6 g/dL (ref 30.0–36.0)
MCV: 96.2 fL (ref 78.0–100.0)
RDW: 12.7 % (ref 11.5–15.5)

## 2010-06-29 LAB — URINE CULTURE: Colony Count: >=100000

## 2010-06-29 LAB — URINALYSIS, ROUTINE W REFLEX MICROSCOPIC
Leukocytes, UA: NEGATIVE
Protein, ur: NEGATIVE mg/dL
Urobilinogen, UA: 1 mg/dL (ref 0.0–1.0)

## 2010-06-29 LAB — COMPREHENSIVE METABOLIC PANEL
ALT: 18 U/L (ref 0–35)
BUN: 12 mg/dL (ref 6–23)
CO2: 26 mEq/L (ref 19–32)
Calcium: 8.7 mg/dL (ref 8.4–10.5)
GFR calc non Af Amer: >60 mL/min (ref >60)
Glucose, Bld: 296 mg/dL — ABNORMAL HIGH (ref 70–99)
Sodium: 133 mEq/L — ABNORMAL LOW (ref 135–145)
Total Protein: 6.7 g/dL (ref 6.0–8.3)

## 2010-06-29 LAB — DIFFERENTIAL
Basophils Relative: 0 % (ref 0–1)
Eosinophils Absolute: 0 10*3/uL (ref 0.0–0.7)
Lymphs Abs: 1.7 10*3/uL (ref 0.7–4.0)
Monocytes Relative: 7 % (ref 3–12)
Neutro Abs: 5.9 10*3/uL (ref 1.7–7.7)
Neutrophils Relative %: 72 % (ref 43–77)

## 2010-06-29 LAB — PROTIME-INR
INR: 0.92 (ref 0.00–1.49)
Prothrombin Time: 12.6 seconds (ref 11.6–15.2)

## 2010-06-29 LAB — GLUCOSE, CAPILLARY: Glucose-Capillary: 241 mg/dL — ABNORMAL HIGH (ref 70–99)

## 2010-06-29 LAB — URINE MICROSCOPIC-ADD ON

## 2010-07-02 LAB — GLUCOSE, CAPILLARY
Glucose-Capillary: 158 mg/dL — ABNORMAL HIGH (ref 70–99)
Glucose-Capillary: 190 mg/dL — ABNORMAL HIGH (ref 70–99)
Glucose-Capillary: 210 mg/dL — ABNORMAL HIGH (ref 70–99)
Glucose-Capillary: 317 mg/dL — ABNORMAL HIGH (ref 70–99)
Glucose-Capillary: 362 mg/dL — ABNORMAL HIGH (ref 70–99)
Glucose-Capillary: 366 mg/dL — ABNORMAL HIGH (ref 70–99)
Glucose-Capillary: 416 mg/dL — ABNORMAL HIGH (ref 70–99)

## 2010-07-02 LAB — CK TOTAL AND CKMB (NOT AT ARMC)
CK, MB: 1.5 ng/mL (ref 0.3–4.0)
Relative Index: 1.5 (ref 0.0–2.5)

## 2010-07-02 LAB — URINALYSIS, ROUTINE W REFLEX MICROSCOPIC
Bilirubin Urine: NEGATIVE
Glucose, UA: >1000 mg/dL — AB
Hgb urine dipstick: NEGATIVE
Specific Gravity, Urine: 1.027 (ref 1.005–1.030)

## 2010-07-02 LAB — CBC
HCT: 41.8 % (ref 36.0–46.0)
Hemoglobin: 14.5 g/dL (ref 12.0–15.0)
MCH: 32.7 pg (ref 26.0–34.0)
MCHC: 32.3 g/dL (ref 30.0–36.0)
MCHC: 33.3 g/dL (ref 30.0–36.0)
MCV: 98.4 fL (ref 78.0–100.0)
Platelets: 177 10*3/uL (ref 150–400)
RDW: 12.5 % (ref 11.5–15.5)
RDW: 12.6 % (ref 11.5–15.5)

## 2010-07-02 LAB — BASIC METABOLIC PANEL
BUN: 8 mg/dL (ref 6–23)
Creatinine, Ser: 0.8 mg/dL (ref 0.4–1.2)
GFR calc Af Amer: >60 mL/min (ref >60)
GFR calc non Af Amer: >60 mL/min (ref >60)
Potassium: 4 mEq/L (ref 3.5–5.1)

## 2010-07-02 LAB — POCT I-STAT, CHEM 8
Glucose, Bld: 378 mg/dL — ABNORMAL HIGH (ref 70–99)
HCT: 47 % — ABNORMAL HIGH (ref 36.0–46.0)
Hemoglobin: 16 g/dL — ABNORMAL HIGH (ref 12.0–15.0)
Potassium: 4.1 mEq/L (ref 3.5–5.1)

## 2010-07-02 LAB — DIFFERENTIAL
Basophils Absolute: 0 10*3/uL (ref 0.0–0.1)
Basophils Relative: 0 % (ref 0–1)
Eosinophils Absolute: 0.1 10*3/uL (ref 0.0–0.7)
Monocytes Relative: 7 % (ref 3–12)
Neutrophils Relative %: 61 % (ref 43–77)

## 2010-07-02 LAB — URINE CULTURE
Colony Count: >=100000
Culture  Setup Time: 201109030208

## 2010-07-02 LAB — POCT CARDIAC MARKERS
CKMB, poc: <1 ng/mL — ABNORMAL LOW (ref 1.0–8.0)
CKMB, poc: <1 ng/mL — ABNORMAL LOW (ref 1.0–8.0)
Troponin i, poc: <0.05 ng/mL (ref 0.00–0.09)

## 2010-07-02 LAB — TROPONIN I: Troponin I: 0.02 ng/mL (ref 0.00–0.06)

## 2010-07-02 LAB — CARDIAC PANEL(CRET KIN+CKTOT+MB+TROPI)
Total CK: 118 U/L (ref 7–177)
Troponin I: 0.02 ng/mL (ref 0.00–0.06)

## 2010-07-02 LAB — APTT: aPTT: 29 seconds (ref 24–37)

## 2010-07-02 LAB — LIPID PANEL: VLDL: 22 mg/dL (ref 0–40)

## 2010-07-02 LAB — PROTIME-INR
INR: 1.02 (ref 0.00–1.49)
Prothrombin Time: 13.6 seconds (ref 11.6–15.2)

## 2010-07-02 LAB — URINE MICROSCOPIC-ADD ON

## 2010-07-02 LAB — T3: T3, Total: 70.7 ng/dl — ABNORMAL LOW (ref 80.0–204.0)

## 2010-07-02 LAB — RAPID URINE DRUG SCREEN, HOSP PERFORMED
Barbiturates: NOT DETECTED
Opiates: NOT DETECTED

## 2010-07-06 LAB — POCT I-STAT, CHEM 8
BUN: 19 mg/dL (ref 6–23)
Calcium, Ion: 1.15 mmol/L (ref 1.12–1.32)
TCO2: 27 mmol/L (ref 0–100)

## 2010-07-06 LAB — URINALYSIS, ROUTINE W REFLEX MICROSCOPIC
Bilirubin Urine: NEGATIVE
Glucose, UA: >1000 mg/dL — AB
Ketones, ur: NEGATIVE mg/dL
Nitrite: NEGATIVE
pH: 5.5 (ref 5.0–8.0)

## 2010-07-06 LAB — GLUCOSE, CAPILLARY
Glucose-Capillary: 329 mg/dL — ABNORMAL HIGH (ref 70–99)
Glucose-Capillary: 398 mg/dL — ABNORMAL HIGH (ref 70–99)

## 2010-07-07 LAB — URINE CULTURE: Colony Count: >=100000

## 2010-07-07 LAB — POCT I-STAT 3, ART BLOOD GAS (G3+)
Acid-Base Excess: 5 mmol/L — ABNORMAL HIGH (ref 0.0–2.0)
Patient temperature: 37
pH, Arterial: 7.449 — ABNORMAL HIGH (ref 7.350–7.400)
pO2, Arterial: 174 mmHg — ABNORMAL HIGH (ref 80.0–100.0)

## 2010-07-07 LAB — URINALYSIS, ROUTINE W REFLEX MICROSCOPIC
Bilirubin Urine: NEGATIVE
Ketones, ur: NEGATIVE mg/dL
Leukocytes, UA: NEGATIVE
Nitrite: POSITIVE — AB
Protein, ur: NEGATIVE mg/dL
Urobilinogen, UA: 2 mg/dL — ABNORMAL HIGH (ref 0.0–1.0)

## 2010-07-07 LAB — DIFFERENTIAL
Basophils Absolute: 0 10*3/uL (ref 0.0–0.1)
Basophils Relative: 0 % (ref 0–1)
Eosinophils Relative: 1 % (ref 0–5)
Monocytes Absolute: 0.6 10*3/uL (ref 0.1–1.0)

## 2010-07-07 LAB — GLUCOSE, CAPILLARY
Glucose-Capillary: 120 mg/dL — ABNORMAL HIGH (ref 70–99)
Glucose-Capillary: 147 mg/dL — ABNORMAL HIGH (ref 70–99)
Glucose-Capillary: 222 mg/dL — ABNORMAL HIGH (ref 70–99)
Glucose-Capillary: 246 mg/dL — ABNORMAL HIGH (ref 70–99)
Glucose-Capillary: 274 mg/dL — ABNORMAL HIGH (ref 70–99)
Glucose-Capillary: 290 mg/dL — ABNORMAL HIGH (ref 70–99)
Glucose-Capillary: 294 mg/dL — ABNORMAL HIGH (ref 70–99)
Glucose-Capillary: 436 mg/dL — ABNORMAL HIGH (ref 70–99)
Glucose-Capillary: 517 mg/dL — ABNORMAL HIGH (ref 70–99)
Glucose-Capillary: 570 mg/dL (ref 70–99)
Glucose-Capillary: >600 mg/dL (ref 70–99)

## 2010-07-07 LAB — TROPONIN I
Troponin I: 0.01 ng/mL (ref 0.00–0.06)
Troponin I: 0.01 ng/mL (ref 0.00–0.06)

## 2010-07-07 LAB — CBC
HCT: 40.9 % (ref 36.0–46.0)
HCT: 41.8 % (ref 36.0–46.0)
Hemoglobin: 14 g/dL (ref 12.0–15.0)
Hemoglobin: 14 g/dL (ref 12.0–15.0)
MCHC: 33.1 g/dL (ref 30.0–36.0)
MCHC: 34 g/dL (ref 30.0–36.0)
MCV: 98 fL (ref 78.0–100.0)
MCV: 99.1 fL (ref 78.0–100.0)
Platelets: 179 10*3/uL (ref 150–400)
Platelets: 202 10*3/uL (ref 150–400)
RBC: 4.14 MIL/uL (ref 3.87–5.11)
RBC: 4.26 MIL/uL (ref 3.87–5.11)
RDW: 12.8 % (ref 11.5–15.5)
WBC: 20.6 10*3/uL — ABNORMAL HIGH (ref 4.0–10.5)
WBC: 7.8 10*3/uL (ref 4.0–10.5)

## 2010-07-07 LAB — CARDIAC PANEL(CRET KIN+CKTOT+MB+TROPI)
CK, MB: 1.4 ng/mL (ref 0.3–4.0)
Relative Index: 1.1 (ref 0.0–2.5)
Relative Index: 1.1 (ref 0.0–2.5)
Total CK: 129 U/L (ref 7–177)

## 2010-07-07 LAB — HEMOGLOBIN A1C
Hgb A1c MFr Bld: 11.2 % — ABNORMAL HIGH (ref <5.7)
Mean Plasma Glucose: 275 mg/dL — ABNORMAL HIGH (ref <117)

## 2010-07-07 LAB — COMPREHENSIVE METABOLIC PANEL
AST: 21 U/L (ref 0–37)
Albumin: 3.1 g/dL — ABNORMAL LOW (ref 3.5–5.2)
Alkaline Phosphatase: 109 U/L (ref 39–117)
Chloride: 104 mEq/L (ref 96–112)
GFR calc Af Amer: >60 mL/min (ref >60)
Potassium: 4.3 mEq/L (ref 3.5–5.1)
Sodium: 137 mEq/L (ref 135–145)
Total Bilirubin: 0.7 mg/dL (ref 0.3–1.2)

## 2010-07-07 LAB — BASIC METABOLIC PANEL
BUN: 20 mg/dL (ref 6–23)
BUN: 22 mg/dL (ref 6–23)
CO2: 23 mEq/L (ref 19–32)
CO2: 26 mEq/L (ref 19–32)
Calcium: 8.5 mg/dL (ref 8.4–10.5)
Chloride: 100 mEq/L (ref 96–112)
Chloride: 96 mEq/L (ref 96–112)
Chloride: 99 mEq/L (ref 96–112)
Creatinine, Ser: 1.21 mg/dL — ABNORMAL HIGH (ref 0.4–1.2)
GFR calc Af Amer: >60 mL/min (ref >60)
Potassium: 4.4 mEq/L (ref 3.5–5.1)
Potassium: 4.6 mEq/L (ref 3.5–5.1)
Sodium: 136 mEq/L (ref 135–145)
Sodium: 137 mEq/L (ref 135–145)

## 2010-07-07 LAB — BRAIN NATRIURETIC PEPTIDE: Pro B Natriuretic peptide (BNP): 65 pg/mL (ref 0.0–100.0)

## 2010-07-07 LAB — CK TOTAL AND CKMB (NOT AT ARMC): Relative Index: 1.3 (ref 0.0–2.5)
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
## 2010-08-02 ENCOUNTER — Inpatient Hospital Stay (INDEPENDENT_AMBULATORY_CARE_PROVIDER_SITE_OTHER)
Admission: RE | Admit: 2010-08-02 | Discharge: 2010-08-02 | Disposition: A | Discharge status: Home / Self Care | Payer: Medicare Other | Source: Ambulatory Visit | Attending: Family Medicine | Admitting: Family Medicine

## 2010-08-02 DIAGNOSIS — N390 Urinary tract infection, site not specified: Secondary | ICD-10-CM

## 2010-08-02 LAB — POCT URINALYSIS DIP (DEVICE)
Hgb urine dipstick: NEGATIVE
Ketones, ur: NEGATIVE mg/dL
Protein, ur: NEGATIVE mg/dL
Specific Gravity, Urine: >=1.03 (ref 1.005–1.030)
pH: 6 (ref 5.0–8.0)

## 2010-08-03 ENCOUNTER — Ambulatory Visit (INDEPENDENT_AMBULATORY_CARE_PROVIDER_SITE_OTHER): Payer: Medicare Other | Admitting: Internal Medicine

## 2010-08-03 ENCOUNTER — Encounter: Payer: Self-pay | Admitting: Internal Medicine

## 2010-08-03 DIAGNOSIS — M545 Low back pain: Secondary | ICD-10-CM

## 2010-08-03 DIAGNOSIS — N390 Urinary tract infection, site not specified: Secondary | ICD-10-CM

## 2010-08-03 MED ORDER — METHOCARBAMOL 500 MG PO TABS: 500.0000 mg | ORAL_TABLET | Freq: Three times a day (TID) | ORAL | Status: AC

## 2010-08-03 MED ORDER — PHENAZOPYRIDINE HCL 100 MG PO TABS: 100.0000 mg | ORAL_TABLET | Freq: Three times a day (TID) | ORAL | Status: AC | PRN

## 2010-08-03 NOTE — Patient Instructions (Signed)
It was good to see you today. We have reviewed your prior records including labs and tests today Ok to start Cipro antibiotics and Pyridium as per urgent care instructions -  Also use Robaxin for muscle relaxant for back pain - Your prescription(s) have been submitted to your pharmacy. Please take as directed and contact our office if you believe you are having problem(s) with the medication(s). Please schedule followup next 2-3 weeks with Dr. Everardo All to review medications and pain symptoms, call sooner if problems.

## 2010-08-03 NOTE — Progress Notes (Signed)
  Subjective:    Patient ID: [name], female    DOB: 02-09-1941, 71 y.o.   MRN: [id number]  HPI  Here for urg care follow up - seen 08/02/10 for flank pain Dx UTI - rx'd cipro + pyridum but not yet begun same No fever, no nausea and vomiting  Complicating chronic LBP symptoms for which she takes percocet 10 qid Also new spasm in low back and supapub region  PMH - reviewed: DM2, chol, HTN, chronic LBP  Review of Systems  Constitutional: Negative for unexpected weight change.  Respiratory: Negative for shortness of breath and wheezing.   Cardiovascular: Negative for chest pain.  Neurological: Negative for syncope, weakness and numbness.      Objective:   Physical Exam  Constitutional:       Morbid obese, sitting in WC - family at side  Cardiovascular: Normal rate, regular rhythm and normal heart sounds.   No murmur heard. Pulmonary/Chest: Effort normal and breath sounds normal. No respiratory distress.  Abdominal: Soft. Bowel sounds are normal. She exhibits no distension. There is no tenderness. There is no rebound.         Assessment & Plan:  UTI - ER f/u See problem list. Medications and labs reviewed today. - ok to start cipro and pyridium as rx'd Also add muscle relaxant to chronic pain mgmt for LBP symptoms

## 2010-08-10 ENCOUNTER — Ambulatory Visit: Payer: Medicare Other | Admitting: Internal Medicine

## 2010-08-12 ENCOUNTER — Other Ambulatory Visit: Payer: Self-pay

## 2010-08-12 ENCOUNTER — Other Ambulatory Visit: Payer: Self-pay | Admitting: Endocrinology

## 2010-08-12 MED ORDER — OXYCODONE-ACETAMINOPHEN 10-325 MG PO TABS: 1.0000 | ORAL_TABLET | ORAL | Status: DC | PRN

## 2010-08-12 NOTE — Telephone Encounter (Signed)
Pt called requesting refill of pain meds, last written 06/23/2010 #120 x 0

## 2010-08-12 NOTE — Telephone Encounter (Signed)
Pt informed, Rx in cabinet for pt pick up  

## 2010-08-12 NOTE — Telephone Encounter (Signed)
i printed 

## 2010-08-17 ENCOUNTER — Encounter: Payer: Self-pay | Admitting: Endocrinology

## 2010-08-17 ENCOUNTER — Ambulatory Visit (INDEPENDENT_AMBULATORY_CARE_PROVIDER_SITE_OTHER): Payer: Medicare Other | Admitting: Endocrinology

## 2010-08-17 VITALS — BP 110/72 | HR 95 | Temp 98.8°F | Resp 18 | Wt 373.5 lb

## 2010-08-17 DIAGNOSIS — E119 Type 2 diabetes mellitus without complications: Secondary | ICD-10-CM

## 2010-08-17 DIAGNOSIS — F329 Major depressive disorder, single episode, unspecified: Secondary | ICD-10-CM

## 2010-08-17 DIAGNOSIS — N390 Urinary tract infection, site not specified: Secondary | ICD-10-CM

## 2010-08-17 DIAGNOSIS — N3000 Acute cystitis without hematuria: Secondary | ICD-10-CM

## 2010-08-17 DIAGNOSIS — I69959 Hemiplegia and hemiparesis following unspecified cerebrovascular disease affecting unspecified side: Secondary | ICD-10-CM

## 2010-08-17 MED ORDER — BUPROPION HCL ER (XL) 300 MG PO TB24: 300.0000 mg | ORAL_TABLET | Freq: Every day | ORAL | Status: DC
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
## 2010-08-17 NOTE — Patient Instructions (Addendum)
Change levemir to 400 units am and 150 units in the evening. check your blood sugar 2 times a day.  vary the time of day when you check, between before the 3 meals, and at bedtime.  also check if you have symptoms of your blood sugar being too high or too low.  please keep a record of the readings and bring it to your next appointment here.  please call us sooner if you are having low blood sugar episodes. Increase budeprion to 300 mg each morning. i'll ask that a home-health agency visit your home.  Recheck urine test today.  please call [phone] to hear your test results.

## 2010-08-28 ENCOUNTER — Emergency Department (HOSPITAL_COMMUNITY)
Admission: EM | Admit: 2010-08-28 | Discharge: 2010-08-28 | Disposition: A | Discharge status: Home / Self Care | Payer: Medicare Other | Attending: Emergency Medicine | Admitting: Emergency Medicine

## 2010-08-28 DIAGNOSIS — I10 Essential (primary) hypertension: Secondary | ICD-10-CM | POA: Insufficient documentation

## 2010-08-28 DIAGNOSIS — M545 Low back pain, unspecified: Secondary | ICD-10-CM | POA: Insufficient documentation

## 2010-08-28 DIAGNOSIS — Z8673 Personal history of transient ischemic attack (TIA), and cerebral infarction without residual deficits: Secondary | ICD-10-CM | POA: Insufficient documentation

## 2010-08-28 DIAGNOSIS — I509 Heart failure, unspecified: Secondary | ICD-10-CM | POA: Insufficient documentation

## 2010-08-28 DIAGNOSIS — M5430 Sciatica, unspecified side: Secondary | ICD-10-CM | POA: Insufficient documentation

## 2010-08-28 DIAGNOSIS — E119 Type 2 diabetes mellitus without complications: Secondary | ICD-10-CM | POA: Insufficient documentation

## 2010-08-28 DIAGNOSIS — I252 Old myocardial infarction: Secondary | ICD-10-CM | POA: Insufficient documentation

## 2010-09-01 NOTE — Discharge Summary (Signed)
NAMEAMILLIA, BIFFLE       ACCOUNT NO.:  [id number]   MEDICAL RECORD NO.:  [id number]          PATIENT TYPE:  INP   LOCATION:  5118                         FACILITY:  [facility]   PHYSICIAN:  [name], M.D. DATE OF BIRTH:  November 03, 1940   DATE OF ADMISSION:  05/05/2007  DATE OF DISCHARGE:  05/17/2007                               DISCHARGE SUMMARY   DISCHARGING PHYSICIAN:  Dr. Carolynne Edouard [name].   OPERATING SURGEON:  Dr. Bertram [name].   REASON FOR ADMISSION:  Ms. [name] is a 70 year old female who has  had a 2-3 day history of abdominal pain.  She also has had associated  nausea which has worsened over the past 24 hours.  At this time, she  came to the emergency room.  A CT scan was obtained which showed  inflammation in the cecum which was consistent with appendicitis.  On  physical examination, the patient's abdomen is obese and tender in the  right lower quadrant as well as the epigastric region.  CT showed  stranding around the appendix with a white blood cell count of 15,600  which is consistent with appendicitis.  At this time, she was admitted  to the hospital and taken to the operating room for a laparoscopic  appendectomy.   ADMITTING DIAGNOSIS:  Acute appendicitis.   HOSPITAL COURSE:  On postoperative day one-half, the patient was  tolerating clear liquids at this time and feeling fairly good, but sore.  At this time, her diet was advanced as tolerated.   By postoperative day 1, the patient was complaining of swelling and just  not feeling like herself.  By this time, she was tolerating a regular  diet.  At this time, home medicines were also restarted and the patient  was told that she needs to get up and ambulate in the hall.   On postoperative day 2, the patient began to complain of nausea and  vomiting.  However, based on the patient's history it seemed more like  she was spitting up.  At this time, we had the patient save all of her  emesis and it did  appear that the patient was having several small bouts  of emesis.  At this time, she was also complaining of having no bowel  movement for the past 5-6 days and therefore a Dulcolax suppository was  given.  A CBC was also ordered for the next day due to some increased  pain that the patient was having to check for possible infection.  An  abdominal x-ray was also ordered for the next day to check for a  questionable ileus as the patient was complaining of persistent nausea  and vomiting.  Also on this day, her IV infiltrated and another IV was  unable to be started; therefore, her IV was left out.   By postoperative day 3, the patient says that she was beginning to feel  a little bit better.  However, she still was having nausea on a regular  diet and therefore was brought down to a full liquid diet.  Her x-rays  on this day showed an ileus which was consistent with physical  examination findings of no bowel sounds.  She also was found to have a  normal white blood cell count at 9.9.  The patient was also started on  MiraLax to help produce a bowel movement.  Since the patient had  multiple episodes of emesis at this point in time and based on the fact  that she now had an ileus, A PICC line was started for IV fluids to  began running again.   The next day, the patient continued to complain of nausea and at this  time she was backed off to n.p.o. except for her medications with a sip  of water.  The patient was also complaining of left thigh pain and on  examination there was found to be no edema, warmth, or erythema;  however, we felt like a Doppler study would be indicated at this time.  The Doppler study came back and it showed that it was negative for a  DVT.  The patient did state that she does have some chronic back pain  which can on occasion make her thigh hurt and at this time this is what  we contributed her pain to.   By postoperative day 5, the patient's x-rays were  beginning to show  multiple loops of dilated small bowel.  However, the patient states that  she was feeling much better today and beginning to have some flatus.  However, she had not had a bowel movement yet.  Later that day, we were  called with a complaint that the patient had had a severe increase in  abdominal pain and her belly had become quite distended and tight.  At  this time, an nasogastric tube was placed in the patient to decompress  her.  Because of this, all of her p.o. medicines were at that time  changed to IV medicines.   By postoperative day 6, the patient was feeling much better with her  nasogastric tube.  However, she was still complaining of some increased  pain in her right lower quadrant and therefore a CT scan was obtained to  rule out any formation of abscesses or any other problems that might be  occurring in her abdomen at that time.  CT scan came back and revealed  no abscesses or any obstruction.  Over the next several days, the  patient continued with her nasogastric tube.   By postoperative day 8, the patient was feeling better and began to have  some bowel sounds and therefore her nasogastric tube was clamped.  Later  that day, it was discontinued based on a small amount of residual  output.   By postoperative day 9, the patient was beginning to have flatus as well  as a bowel movement.  She was not complaining of any additional nausea  or vomiting since her nasogastric tube had been pulled the night before.  She states that at this time she is hungry.  She was started on sips of  clear liquids and then advanced to a clear liquid tray for dinner.   Over the next several days, her diet was to advanced and by  postoperative day 11 the patient was tolerating a regular carbohydrate  modified diet.  Once the patient tolerated full liquids, she was put  back on her home p.o. medicines.  On postoperative day 11, the patient  was stable and ready for  discharge and at this time her PICC line was  discontinued and the patient was discharged home.  DISCHARGE DIAGNOSES:  1. Acute appendicitis.  2. Status post laparoscopic appendectomy.  3. Postoperative ileus, resolved.   DISCHARGE MEDICATIONS:  The patient was told to restart all her home  medications which include amlodipine, enteric coated aspirin, Hyzaar,  NovoLog mix, Vytorin, another NovoLog mix, as well as Lasix.  She also  takes Vicodin as needed for pain and was told to continue taking this as  she needed it for pain from her abdominal surgery as well.   DISCHARGE INSTRUCTIONS:  At this time, the patient was instructed that  she may increase her activity slowly as well as she may walk up steps.  She was also told that at this time she may shower.  She is not to lift  anything greater than 50 pounds for the next 2 weeks as well as she is  not to drive while she takes any narcotic pain medicines.  She was also  told that her Steri-Strips may began to fall off and if so that is okay  and if she feels that she needs it she can place Band-Aids over her  incisions as needed.  She was told that she needs to call our office if  she beings to get a fever greater than 101.5, has any new or severe  abdominal pain, or notices any redness or pus-like  drainage from her incisions.  She was told to return to see Dr. Freida Busman  with [facility] in 2 weeks for a follow up appointment.  She was also instructed to follow up with her primary care physician in  1-2 weeks to have follow up care for any of her problems such as her  diabetes or hypertension.      Kylie Cape, PA      [name], M.D.  Electronically Signed    KEO/MEDQ  D:  05/31/2007  T:  06/02/2007  Job:  [id number]   cc:   Lennie Muckle, MD

## 2010-09-01 NOTE — H&P (Signed)
[name], [name]       ACCOUNT NO.:  [id number]   MEDICAL RECORD NO.:  [id number]          PATIENT TYPE:  INP   LOCATION:  5118                         FACILITY:  [facility]   PHYSICIAN:  Lennie Muckle, MD      DATE OF BIRTH:  [birth year]   DATE OF ADMISSION:  05/05/2007  DATE OF DISCHARGE:                              HISTORY & PHYSICAL   DIAGNOSIS:  Appendicitis.   Ms. [name] is a 70 year old female who has 2- to 3-day history of  abdominal pain.  She also had associated nausea, apparently worsened  over the past 24 hours and she came to the [facility] emergency  department.  A CT scan was obtained which showed inflammation near the  cecum which was consistent with appendicitis.  The patient has also  suffered from diarrhea.   PAST MEDICAL HISTORY:  1. Hypertension.  2. Diabetes.  3. Coronary artery disease.   PAST SURGICAL HISTORY:  Hysterectomy.   She did have a history of stroke in the remote past as well as a  myocardial infarction.   MEDICATIONS:  Include Hyzaar, amlodipine, Lasix, and Vytorin.   ALLERGIES:  None.   REVIEW OF SYSTEMS:  She had the history of stroke without neurologic  deficits and a previous mild myocardial infarction.  Other systems are  negative.  She does suffer from diabetes.   PHYSICAL EXAMINATION:  Temperature is 99.4, blood pressure 118/66, pulse  96.  CHEST:  Has coarse breath sounds bilaterally.  CARDIOVASCULAR:  Regular rate and rhythm.  ABDOMEN:  Obese.  She is tender in the right lower quadrant as well as  the epigastric region.   CT shows stranding around the appendix.  White count is 15.6.   ASSESSMENT AND PLAN:  Appendicitis.   I discussed with the patient performing appendectomy with laparoscopic  techniques, give her IV antibiotics, admit postoperatively for pain  control, and then most likely be able to discharge home in the next  couple of days.      Lennie Muckle, MD  Electronically Signed     ALA/MEDQ  D:  05/06/2007  T:  05/06/2007  Job:  [id number]

## 2010-09-01 NOTE — Op Note (Signed)
[name], [name]       ACCOUNT NO.:  [id number]   MEDICAL RECORD NO.:  [id number]          PATIENT TYPE:  INP   LOCATION:  5118                         FACILITY:  [facility]   PHYSICIAN:  Lennie Muckle, MD      DATE OF BIRTH:  1941/03/07   DATE OF PROCEDURE:  05/06/2007  DATE OF DISCHARGE:                               OPERATIVE REPORT   PREOPERATIVE DIAGNOSIS:  Appendicitis.   POSTOPERATIVE DIAGNOSIS:  Appendicitis.   PROCEDURE:  Laparoscopic appendectomy.   SURGEON:  Amber L. Freida Busman, MD, no assistants.   General endotracheal anesthesia.   SPECIMENS:  Appendix.   ESTIMATED BLOOD LOSS:  Approximately 50 mL.   No immediate complications.   INDICATION FOR PROCEDURE:  [name] is a 70 year old female who  came to the emergency department with lower abdominal pain.  CT scan was  consistent with appendicitis.  Informed consent was obtained for the  procedure.   DETAILS OF PROCEDURE:  [name] was identified in the  preoperative holding area, taken to the operating room, and placed in a  supine position.  After administration of general endotracheal  anesthesia, her abdomen was prepped and draped in the usual sterile  fashion.  A time-out procedure indicating the patient and procedure was  performed.  Using a Veress needle placed in the left upper quadrant,  pneumoperitoneum was able to be obtained.  Using a 5-mm trocar in the  OptiView, a camera was placed inside the abdominal cavity just above the  umbilicus.  The Veress needle was seen.  There was no evidence of injury  with the Veress needle or the trocar.  An additional 5-mm trocar was  placed in the right upper quadrant.  Upon inspection of the abdomen,  there were midline incisional adhesions.  Some of these were taken down  with sharp dissection with the scissors.  There was a small bowel  incision to the incisional scar.  I elected to leave this in place.  I  then placed another 5-mm port in the  right lower quadrant just to the  right of the scar tissue.  The supraumbilical port was changed to a 12-  mm trocar.  The cecum was identified in the right lower quadrant.  The  base of the appendix was visualized.  I then proceeded with the  dissection of the tip of the appendix.  It was densely stuck to the  mesentery of the small bowel.  After extensive dissection I was able to  start to separate the appendix from the mesentery.  After fully  dissecting, I was able to find the tip.  The mesoappendix was divided  with the harmonic scalpel.  I then stapled across the base of the  appendix with a laparoscopic GIA stapler.  The appendix was placed in an  EndoCatch bag and removed from the abdomen.  The abdomen was irrigated  with 2 L of saline.  It was fully inspected at the end of the case  without any evidence of bleeding.  There was no purulent fluid noted at  the end of the case.  The fascial defect of the 11-mm trocar  was  closed with 0 Vicryl suture using the suture passer.  The skin was  closed with 4-0 Monocryl.  Steri-Strips were placed and following  dressing, the patient was extubated and transported to the  postanesthesia care unit in stable condition.      Lennie Muckle, MD  Electronically Signed     ALA/MEDQ  D:  05/06/2007  T:  05/06/2007  Job:  [id number]

## 2010-09-01 NOTE — Discharge Summary (Signed)
[name], [name]       ACCOUNT NO.:  [id number]   MEDICAL RECORD NO.:  [id number]          PATIENT TYPE:  INP   LOCATION:  1436                         [facility]ACILITY:  [facility]   PHYSICIAN:  Titus Dubin. Hopper, MD,FACP,FCCPDATE OF BIRTH:  08/29/1940   DATE OF ADMISSION:  04/11/2008  DATE OF DISCHARGE:  04/14/2008                               DISCHARGE SUMMARY   Private patient of Sean A. Everardo All, MD.   ADMITTING DIAGNOSES:  1. Chest pressure, atypical chest pain.  2. Diabetes with insulin resistance.  3. Hypokalemia.  4. Past history of stroke.  5. Morbid obesity.   DISCHARGE DIAGNOSES:  1. Chest pain resolved, probable reflux.  2. Hypokalemia.  3. Diabetes poorly controlled.  4. Low back pain.  5. Relative hypotension.  6. Hyponatremia.   For details of the history and physical, please see the dictated report,  December 25, by Dr. Staci Righter.   The patient was monitored with serial cardiac enzymes, which were  negative.  The chest pain resolved within 24 hours, but she subsequently  described  some right lower quadrant pain and back pain.  Additionally,  she had some dysuria.   According to her history, she has had no colonoscopy or endoscopy.  She  does have a history of pinched nerve in her low back.   There was equivocal straight leg raising on the right.  There was no  edema.   Hematocrit remained stable with no anemia.  Lipase and amylase were also  normal.  EKG revealed  low voltage related to her body habitus.   The urinalysis revealed many bacteria; this was a clean catch.  She had  positive nitrates and large leukocytes.  Pending at the time of  discharge is a final culture.  She will be placed on Cipro pending  followup with Dr. Everardo All.   She is ALLERGIC TO ACTOS AND ZESTRIL.   Her initial potassium was 3.5, but it dropped to 3.3.  Sodium was 139,  but it was 134 the day of discharge.  Her Hyzaar  @ admission was  changed to Cozaar 50 and  hydrochlorothiazide 12.5.  These were  subsequently discontinued at the time of discharge because of the  hypokalemia and the hyponatremia.   At the time of her discharge, she had essentially no chest pain or  abdominal pain.  The low back pain had responded to Gabapentin.  She was  also placed on a PPI for reflux.   Her discharge potassium had improved.  Prognosis is guarded because of  her poorly controlled diabetes and morbid obesity.   She was discharged on Norvasc 2.5 mg daily; she was to increase up to  twice a day for blood pressure averaging greater than 130/85.On the day  of discharge her blood pressure was 106/62.  She was to continue her  Vytorin 10/80 at bedtime.  She was to stop her Hyzaar.  She was to  continue her 81 mg of aspirin with food.  She was to be on Omeprazole 20  mg 30 minutes before breakfast & eve meal .  She was to continue her  furosemide 20 mg daily.  She was given a prescription for potassium  chloride 20 mEq, although she had not been taking it at home. She stated  that it had not been renewed by the office.  She was to continue her  Humalog 75/25 at 150 units in the morning and 30 units in the evening.  As noted, she will be on Cipro.   Diet should be low glycemic carb such as  [employer] low carb  program.   She has been asked to see Dr. Everardo All in 5 days with her blood pressure  readings.  Her BMET can be repeated at that time.      Titus Dubin. Alwyn Ren, MD,FACP,FCCP  Electronically Signed     WFH/MEDQ  D:  04/14/2008  T:  04/14/2008  Job:  [id number]   cc:   Gregary Signs A. Everardo All, MD  [address]  [place]  [place] [address]

## 2010-09-01 NOTE — H&P (Signed)
NAMEMEGHAN, WARSHAWSKY NO.:  [id number]   MEDICAL RECORD NO.:  [id number]          PATIENT TYPE:  EMS   LOCATION:  ED                           FACILITY[facility]   PHYSICIAN:  Gardiner Barefoot, MD    DATE OF BIRTH:  December 30, 1940   DATE OF ADMISSION:  04/11/2008  DATE OF DISCHARGE:                              HISTORY & PHYSICAL   PRIMARY CARE PHYSICIAN:  DrSean Everardo All, of [facility].   CHIEF COMPLAINT:  Chest pressure.   HISTORY OF PRESENT ILLNESS:  A 70 year old African American female,  morbidly obese with several-month history of intermittent substernal  chest pressure.  The patient reports some association with diaphoresis  and shortness of breath with these.  Also some nausea.  She also reports  radiation to both arms and some numbness in her arms with pressure.  Denies any fever or recent illnesses.  She has had a cardiac cath in the  past which showed no significant lesions; however, it was in 2005.   PAST MEDICAL HISTORY:  1. Morbid obesity.  2. Hyperlipidemia.  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   SURGICAL HISTORY:  1. Appendectomy in 04/2007.  2. Hysterectomy.   MEDICATIONS:  1. Amlodipine 2.5 mg daily.  2. Vytorin 10/80 mg daily.  3. Hyzaar 100/25 mg daily.  4. NovoLog t.i.d. 35 units, 35 units and 25 units.   FAMILY HISTORY:  No early cardiac death.  Mother died at 87 and had  diabetes and father died in his 73s.   SOCIAL HISTORY:  History of tobacco use and no alcohol use.   REVIEW OF SYSTEMS:  Negative except for the history of present illness.   PHYSICAL EXAM:  VITALS:  Temperature 97.9, pulse 74-97, respirations 20,  blood pressure 120/57, O2 sats 95%.  GENERAL:  The patient is awake, alert and oriented x3, appears in no  distress.  CARDIOVASCULAR:  Regular rate and rhythm.  No murmurs, rubs or gallops.  LUNGS:  Clear to auscultation bilaterally.  ABDOMEN:  Morbidly obese.  Nontender, nondistended.  Positive bowel  sounds.  No hepatosplenomegaly.  EXTREMITIES:  No edema.   LABORATORY DATA:  1. Included troponin less 0.05, CK-MB is 1.7, D-dimer less than 0.22,      T bili 0.7, alk phos 92,WBCs 8.3, hemoglobin 13, platelets 198,      potassium 3.2, glucose 399, AST 22 and albumin 3.1.  Creatinine      0.2.  2. EKG shows normal sinus rhythm and chest x-ray shows mild vascular      congestion without edema and atelectasis.   IMPRESSION AND PLAN:  1. Chest pain:  The patient has had intermittent chest pain and      anginal-like symptoms over the last month which is concerning for      cardiac origin.  Will need admission for serial enzymes.  Will also      consider stress test.  2. Diabetes:  On sliding-scale insulin for now.  3. Hypokalemia:  We will give her p.o. potassium.  4. History of stroke:  We will continue with her Vytorin and aspirin.  5. DVT  prophylaxis.      Gardiner Barefoot, MD  Electronically Signed     RWC/MEDQ  D:  04/12/2008  T:  04/12/2008  Job:  [id number]

## 2010-09-04 NOTE — Discharge Summary (Signed)
[name], BANCROFT       ACCOUNT NO.:  [id number]   MEDICAL RECORD NO.:  [id number]          PATIENT TYPE:  INP   LOCATION:  2013                         FACILITY:  [facility]   PHYSICIAN:  [name], MDDATE OF BIRTH:  08/13/1940   DATE OF ADMISSION:  03/28/2006  DATE OF DISCHARGE:  03/30/2006                               DISCHARGE SUMMARY   DISCHARGE DIAGNOSES:  1. Chest pain/shortness of breath, likely noncardiac.  2. Chronic lower extremity edema.   HISTORY OF PRESENT ILLNESS:  [name] is a morbidly obese  African-American female with a history of diabetes type 2 and  hyperlipidemia.  She was admitted with a chief complaint of an episode  earlier on the day of admission which included sharp left-sided chest  pain with some associated shortness of breath.  This occurred while she  was at the store.  She noted that it was not exertional; however, it did  last several minutes and resolved on its own.  She was admitted for  further evaluation and treatment.   PAST MEDICAL HISTORY:  1. Diabetes type 2.  2. Morbid obesity.  3. Hyperlipidemia.  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  5. Gallstones.   HOSPITAL COURSE:  1. Chest pain.  The patient was admitted and underwent serial cardiac      enzymes.  These enzymes were negative.  She was seen in      consultation by Dr. Antoine Poche of [facility].  It was felt      that her symptoms were most likely atypical in nature.  No further      inpatient cardiac workup was pursued; however, she could likely      benefit from an outpatient Cardiolite.  2. Elevated D-dimer.  The patient was noted to have on repeat an      elevated D-dimer.  A CT angio of the chest was performed, which was      negative for PE; however, there was noted to be some mediastinal      hilar adenopathy of unclear etiology.  This will need continued      outpatient monitoring.  Will defer to patient's' primary  M.D.   The patient also underwent a 2-D echo performed during this admission,  which noted a normal left ventricular ejection fraction of 55% to 65%.  She does continue to complain of chronic lower extremity edema, which is  2+ at time of exam.  At discharge she will be continued on her home  Lasix and will defer further medication adjustments to her primary care.   DISCHARGE MEDICATIONS:  1. The patient will be continued on her NovoLog as prior to admission,      which includes 35 units subcutaneous q.a.m., 35 units subcutaneous      with lunch, and 25 units subcutaneous at dinner.  2. She is also to be continued on Zocor at the same dose as prior to      admission.  3. Aspirin 325 mg p.o. daily.  4. Vicodin 1 tab every 6 hours as needed as prior to admission.  5. Lasix 20 mg p.o. daily  as prior to admission.   PERTINENT LABORATORY DATA:  Serial cardiac enzymes are negative.  BUN  20, creatinine 0.9.   DISPOSITION:  The patient will be discharged to home.  She is instructed  to follow up with Dr. Romero [name] on December 28 as scheduled.  She is  instructed to call his office and to follow up with one of his  colleagues should she have worsening or changes in her symptoms prior to  her appointment with [name].  Per Dr. Ladona Ridgel, the cardiology group,  will arrange an outpatient Cardiolite prior to discharge.      Kylie Craze, NP      Kylie Rover. Felicity Coyer, MD  Electronically Signed    MO/MEDQ  D:  03/30/2006  T:  03/30/2006  Job:  [id number]   cc:   Kylie Signs A. Everardo All, MD

## 2010-09-04 NOTE — Discharge Summary (Signed)
[name], [name]       ACCOUNT NO.:  [id number]   MEDICAL RECORD NO.:  [id number]          PATIENT TYPE:  OBV   LOCATION:  2013                         FACILITY:  [facility]   PHYSICIAN:  Raenette Rover. Felicity Coyer, MDDATE OF BIRTH:  1940-10-08   DATE OF ADMISSION:  03/28/2006  DATE OF DISCHARGE:  03/30/2006                               DISCHARGE SUMMARY   DISCHARGE DIAGNOSIS:  Chest pain is with negative workup for pulmonary  embolus and myocardial infarction.   HISTORY OF PRESENT ILLNESS:  [name] is a 70 year old female  with a history of diabetes type 2 and hyperlipidemia who presented with  a chief complaint of chest pain associated with shortness of breath. She  was admitted for further evaluation and treatment.   PAST MEDICAL HISTORY:  1. Diabetes type 2.  2. Morbid obesity.  3. 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  4. History of gallstone.  5. Hyperlipidemia.   COURSE OF HOSPITALIZATION:  Chest pain.  The patient was admitted and  was seen in consultation by [facility], Dr. Antoine Poche. She had  negative serial cardiac enzymes. A CT angio of the chest was also  performed secondary to an elevated D-dimer and it was negative for PE.  The patient was sent home with a follow-up stress test scheduled for  April 07, 2006.   DISCHARGE MEDICATIONS:  1. NovoLog 35 units subcu in the morning, 35 units subcu before lunch,      and 25 units subcu before dinner, as prior to admission.  2. Zocor, the patient is instructed to continue the same dose as prior      to admission.,  3. Aspirin 325 mg p.o. daily.  4. Vicodin 1 tablet p.o. q.6h. p.r.n.  5. Lasix 20 mg p.o. daily as prior to admission.   DISPOSITION:  The patient is discharged to home.  She is instructed to  follow up with Dr. Romero [name] on December 28 as scheduled.     Kylie Craze, NP      Raenette Rover. Felicity Coyer, MD  Electronically Signed   MO/MEDQ  D:  05/10/2006  T:  05/10/2006  Job:   [id number]

## 2010-09-04 NOTE — Cardiovascular Report (Signed)
NAME:  [name], [name]                 ACCOUNT NO.:  [id number]   MEDICAL RECORD NO.:  [id number]                   PATIENT TYPE:  OIB   LOCATION:  6501                                 FACILITY:  [facility]   PHYSICIAN:  Rollene Rotunda, M.D.                DATE OF BIRTH:  11-09-1940   DATE OF PROCEDURE:  10/28/2003  DATE OF DISCHARGE:                              CARDIAC CATHETERIZATION   PRIMARY CARE PHYSICIAN:  Sean A. Everardo All, M.D. [facility].   REASON FOR PRESENTATION:  Evaluate patient with chest pain, multiple  cardiovascular risk factors and Cardiolite suggesting inferoapical ischemia.   PROCEDURE NOTE:  Left heart catheterization was performed via the right  femoral artery.  The artery was cannulated using an anterior wall puncture.  A 5 French arterial sheath was inserted via the modified Seldinger  technique.  Preformed Judkins and a pigtail catheter were utilized.  The  patient tolerated the procedure well and left the lab in stable condition.   RESULTS:   HEMODYNAMICS:  1. LV 176/14.  2. Aortic 176/57.   CORONARIES:  The left main was normal.  The LAD was normal.  There was a  first and second diagonal which were moderate size and normal.  Circumflex  and the AV groove was normal.  There was a very large mid obtuse marginal  which was normal.  The right coronary artery was dominant.  There appeared  approximately 30-40% stenosis at a bend in the vessel.  This was seen only  in one view.  There was PDA which was normal.   LEFT VENTRICULOGRAM:  Left ventriculogram was obtained in the RAO  projection.  The EF was 65% with normal wall motion.   CONCLUSION:  1. Nonobstructive coronary artery disease.  2. Well preserved ejection fraction with no wall motion abnormalities.   Given the low risk Cardiolite scan, no further cardiovascular testing is  suggested.  She will continue with aggressive risk reduction.  She will be  given written instructions not to take her  Glucophage for 48 hours.  Of  note, she is hypertensive.  Will refer her back to Dr. Everardo All for treatment  of this.  I will take the liberty of starting an ACE inhibitor.  I will  clearly outline the risks including the angioedema.                                               Rollene Rotunda, M.D.    JH/MEDQ  D:  10/28/2003  T:  10/28/2003  Job:  [id number]

## 2010-09-04 NOTE — H&P (Signed)
[name], [name]       ACCOUNT NO.:  [id number]   MEDICAL RECORD NO.:  [id number]          PATIENT TYPE:  INP   LOCATION:  1832                         FACILITY:  [facility]   PHYSICIAN:  Michael L. Reynolds, M.D.DATE OF BIRTH:  1940-11-05   DATE OF ADMISSION:  04/22/2004  DATE OF DISCHARGE:                                HISTORY & PHYSICAL   CHIEF COMPLAINT:  Slurred speech.   HISTORY OF PRESENT ILLNESS:  This is the initial stroke service admission  for this 70 year old woman with a past medical history which includes  diabetes, hypertension and morbid obesity.  The history was obtained from  the EMS and also secondhand from her daughter.  She reportedly was at work  this morning and suddenly felt weak and clammy.  She complained of bilateral  hand numbness and mild chest pain.  She went to the clinic at [facility] where EMS  was alerted to the chest pain.  During her evaluation by the EMS at  approximately 11:20 a.m., she acutely developed slurred speech.  She was  brought to [facility] where stroke protocol was initiated.  On  initial exam, the patient was awake and alert with very slurred speech, but  able to follow commands.  She had questionable right-sided weakness.  CT of  the head was negative.  Laboratories were unremarkable.  TPA was  administered with bolus at 1:30 p.m.  The patient has no known previous  history of stroke and has had no recent similar symptoms in the past.  Her  symptoms have improved somewhat since she has received TPA.  She denies any  associated headache, dizziness, nausea, vomiting or loss of consciousness.   PAST MEDICAL HISTORY:  Remarkable for morbid obesity and diabetes as above.  She has noncritical coronary artery disease by catheterization in July of  last year, as well as significant osteoarthritis.   FAMILY HISTORY:  Remarkable for hypertension, diabetes, coronary artery  disease and stroke.   SOCIAL HISTORY:  She smokes  heavily per her daughter.  She lives alone and  normally is independent in her activities of daily living.  She works at  [employer].   ALLERGIES:  No known drug allergies.   MEDICATIONS:  1.  Aspirin 500 mg daily.  2.  Actos 50 mg daily.  3.  Glipizide XL 10 mg daily.  4.  Hyzaar 100/25 mg daily.   REVIEW OF SYSTEMS:  Somewhat limited due to the patient's poor speech.  She  denies any headache, nausea, vomiting, dizziness or loss of consciousness.  She did have chest pain today as noted above.  Her daughter says that she  occasionally complains of shortness of breath and has persistent complaints  of arthritic pain, especially in the hips and knees.  Otherwise negative,  except as outlined in the HPI and in the admission nursing record.   PHYSICAL EXAMINATION:  VITAL SIGNS:  Temperature 98.8 degrees, blood  pressure 136/79, pulse 94, respirations 24, O2 saturation 100% on room air.  WEIGHT:  Approximate weight over 300 pounds.  GENERAL APPEARANCE/MENTAL STATUS:  She is awake and alert.  She appears  anxious.  She  is morbidly obese.  She is in no respiratory distress.  Speech  is dysarthric and hesitant, but she is able to briskly follow commands.  HEENT:  The cranium is normocephalic and atraumatic.  The oropharynx is  benign.  NECK:  Supple without carotid bruits.  CHEST:  Clear to auscultation bilaterally.  HEART:  Regular rate and rhythm without murmur.  ABDOMEN:  Soft and nontender.  Normoactive bowel sounds.  EXTREMITIES:  1+ edema.  NEUROLOGIC:  Cranial Nerves:  Pupils are equal and reactive.  Extraocular  movements full without nystagmus.  She blinks to threat from both sides.  The right face is droopy with mild weakness.  She does not protrude her  tongue well.  The palate moves in the midline.  Motor:  Strength of the left  upper extremity is normal.  Strength of the lower extremities is normal.  In  the right upper extremity, she strongly resists manipulation against a   painful right shoulder.  May have a little bit of distal weakness in the  hand.  Sensation is intact to painful stimulus in the lower extremities.  Reflexes are 1+ and symmetric.  The toes are downgoing.   LABORATORY REVIEW:  CBC with white count 7.7, hemoglobin 13.6 and platelets  222,000.  CMET is unremarkable, except for an elevated glucose of 136 and an  albumin of 3.3.  Coagulation times are normal.  Cardiac markers are  negative.  The urine drug screen is negative.  The CT of the head is  personally reviewed and demonstrates no acute abnormality.   IMPRESSION:  Acute stroke, likely subcortical, resulting in dysarthria and  improving status post TPA infusion.   PLAN:  Will admit to the ICU for routine post TPA care.  Will proceed with a  stroke workup, including MRI, MRA, carotid and transcranial Dopplers, stroke  laboratories and echocardiogram.  She will likely need a TEE due to her  morbid obesity.  I will speech, physical and occupational therapy  consulting.  Stroke service to follow.       MLR/MEDQ  D:  04/22/2004  T:  04/22/2004  Job:  [id number]   cc:   Gregary Signs A. Everardo All, M.D. [facility]

## 2010-09-04 NOTE — Consult Note (Signed)
[name], [name]       ACCOUNT NO.:  [id number]   MEDICAL RECORD NO.:  [id number]          PATIENT TYPE:  INP   LOCATION:  2013                         FACILITY:  [facility]   PHYSICIAN:  Rollene Rotunda, MD, FACCDATE OF BIRTH:  [birth year]   DATE OF CONSULTATION:  03/29/2006  DATE OF DISCHARGE:                                 CONSULTATION   PRIMARY CARE PHYSICIAN:  Dr. Everardo All.   CONSULTING PHYSICIAN:  Dr. Felicity Coyer.   REASON FOR CONSULTATION:  Patient with chest pain.   HISTORY OF PRESENT ILLNESS:  Patient is a pleasant 70 year old African-  American female.  She has a past cardiac history that includes workups  for chest pain with no clear cardiac etiology.  She does report a  questionable heart attack some years ago with a catheterization by  another cardiology group.  I do not have a report of this.  I do know  that she was catheterized in 2005 and had a 30% right coronary lesion  only.  This was followup of a slightly abnormal stress perfusion study.   She was in her usual state of health.  She gets around okay.  I think  she is limited by some morbid obesity.  She, on Friday, had chest  discomfort.  This was in the middle of her chest and radiated around  under her breasts.  There was no radiation to her arms or to her jaw.  She thinks it was similar to the heart attack that she had years ago.  She went to the emergency room where workup was apparently normal and  she was sent home.  On Saturday, she had some increasing dyspnea and  stayed in bed.  The dyspnea would occur when she got up to try to do  something like walk to the bathroom.  She remained was in bed on Sunday.  Finally, she got up and was walking through a parking lot on last Monday  when she had significant shortness of breath and recurrent chest  discomfort.  She went to the emergency room.  There were no objective  findings of ischemia on either the EKG or point-of-care markers.  She  was treated with  very low doses of Lasix and aspirin.  She was admitted.  She has had some twinges of chest discomfort since then.  She says her  breathing is better today than it was yesterday.  She is able to  ambulate off of O2.  She did have an elevated D-dimer.  A preliminary CT  report demonstrates no evidence of pulmonary embolism, although there is  some mild adenopathy.   The patient's chest discomfort has been moderate.  She has a difficult  time quantifying or qualifying it.  There is no associated nausea,  vomiting, or diaphoresis.  She is not report palpations, presyncope, or  syncope, and she has had no fevers, chills, or a cough.   PAST MEDICAL HISTORY:  Non-obstructive coronary disease (30% right  coronary lesion in 2005), hyperlipidemia x years, diabetes x10-15 years,  morbid obesity, hypertension, CVA of the left middle cerebral artery in  January of 2006, cholelithiasis, tobacco use, osteoarthritis.  ALLERGIES:  None.   CURRENT MEDICATIONS:  (Since admission)  1. Insulin.  2. Lasix 20 mg.  3. Getting heparin.  4. Zocor 40 mg.  5. Aspirin 325 mg.  6. Lantus.  7. Vicodin.   SOCIAL HISTORY:  The patient lives in [place] with daughter.  She  quit smoking 2 months ago.  She does not drink alcohol.   FAMILY HISTORY:  Is contributory for coronary artery disease and later  onset in first and second-degree relatives.   REVIEW OF SYSTEMS:  Positive for blurred vision at times, edema in her  legs, numbness in her hands, urinary urgency, joint pains, fluctuating  blood sugars.  Negative for other systems.   PHYSICAL EXAMINATION:  GENERAL:  The patient is in no acute distress.  VITAL SIGNS:  Blood pressure 95/63, heart rate 74 and regular, afebrile,  99% saturation on room air.  HEENT:  Eyes:  Unremarkable.  Pupils are equal, round, reactive to  light.  Normal fundi visualized.  Nose and throat unremarkable.  NECK:  No jugular venous distention, waveform within normal limits.   Carotid upstroke brisk and symmetrical.  No bruits, thyromegaly.  Difficult exam secondary to her obesity.  LYMPHATICS:  No cervical, axillary, or inguinal lymphadenopathy (again,  the exam is compromised).  BACK:  Mild tenderness in the mid lower lumbar region, no costovertebral  angle tenderness.  HEART:  PMI not displaced or sustained, S1 and S2 within normal limits,  no S3, no S4, no murmurs.  ABDOMEN:  Obese, positive bowel sounds normal in frequency, no bruits,  no rebound, no guarding, no midline pulsatile masses or splenomegaly.  SKIN:  No rashes.  EXTREMITIES: 2+ pulses throughout, trace bilateral lower extremity  edema, no cyanosis, no clubbing.  NEURO:  Oriented to person, place, time, cranial nerves II through XII  grossly intact, motor grossly intact.   LABORATORY DATA:  EKG:  Sinus rhythm, rate 73, axis within normal  limits, intervals within normal limits, no acute ST or T-wave change,  poor anterior R wave progression.   Chest x-ray:  No acute disease with chronic interstitial mild lung  changes.   WBC 8.8, hemoglobin 13.3, platelets 240, sodium 136, calcium 4.5, BUN  20, CK 137, troponin 0.01, D-dimer 0.85, BNP less than 30.   Echocardiogram:  Poor acoustic windows.  A well-preserved left  ventricular ejection fraction.  The exam was unable to adequately assess  regional wall motion.   ASSESSMENT AND PLAN:  1. Chest, the patient's chest discomfort has atypical more than      typical features.  She has had negative workup for coronary artery      disease in the past.  She had no objective evidence of ischemia;      however, she does have some significant risk factors.  Given this      low pretest probability, an outpatient stress perfusion study if      she continues to have negative enzymes and resolution of her pain      would be reasonable.  We could do this as an outpatient. 2. Dyspnea.  This was a dominant complaints.  With the BNP level less      than 30,  it strongly suggests that this is not a cardiac origin.      At this point the workup will be as above.  3. Morbid obesity.  She understands the need to lose weight with diet      and exercise.  Rollene Rotunda, MD, [facility]  Electronically Signed     JH/MEDQ  D:  03/29/2006  T:  03/30/2006  Job:  [id number]   cc:   Gregary Signs A. Everardo All, MD

## 2010-09-04 NOTE — Discharge Summary (Signed)
[name], [name]       ACCOUNT NO.:  [id number]   MEDICAL RECORD NO.:  [id number]          PATIENT TYPE:  INP   LOCATION:  3041                         FACILITY:  [facility]   PHYSICIAN:  Marlan Palau, M.D.  DATE OF BIRTH:  1940/07/21   DATE OF ADMISSION:  04/22/2004  DATE OF DISCHARGE:  04/24/2004                                 DISCHARGE SUMMARY   ADMISSION DIAGNOSIS:  Acute stroke likely subcortical.   DISCHARGE DIAGNOSES:  1.  Left middle cerebral artery distribution cerebrovascular infarction.  2.  Morbid obesity.  3.  Diabetes.   PROCEDURES DURING THIS ADMISSION:  1.  MRI scan of the brain.  2.  MR angiogram.  3.  CT of the head.  4.  2D echocardiogram.  5.  TPA administration.  6.  Carotid Doppler study.   COMPLICATIONS:  None.   PROCEDURES:  Above.   HISTORY OF PRESENT ILLNESS:  [name] is a 70 year old black  female born 1940/12/04, with a history of diabetes and morbid obesity.  The patient has also a history of coronary artery disease.  The patient was  brought into the hospital for an evaluation of onset of weakness and  bilateral numbness of the hands, some mild chest pain.  The patient also  developed slurred speech prior to this admission, had some slight right-  sided weakness on this evaluation.  CT of the head through the emergency  room was unremarkable, the patient was felt to be an adequate candidate for  TPA and this was given in the emergency room prior to her admission.   PAST MEDICAL HISTORY:  1.  History of morbid obesity.  2.  Diabetes.  3.  Coronary artery disease.  4.  Osteoarthritis.   MEDICATIONS PRIOR TO ADMISSION:  1.  Aspirin 500 mg daily.  2.  Actos 15 mg daily.  3.  Glipizide XL 10 mg daily.  4.  Hyzaar 100/25 mg daily.  THE PATIENT HAS NO KNOWN ALLERGIES.  The patient does smoke a pack-a-day,  does not drink alcohol.   Please refer to HISTORY AND PHYSICAL dictation for social history, family  history, review of systems, physical examination.   LABORATORY VALUES:  Notable for a urine drug screen is negative, alcohol  level less than 5, sodium level 138, potassium 3.8, chloride 104, CO2 27 and  glucose 136, BUN 7, creatinine 1.0, calcium 9.3, total protein 6.7, albumin  3.3, AST 17, ALT 13, alkaline phosphatase 96, total bilirubin 0.5, INR of  0.8, white count of 7.7, hemoglobin 13.6, hematocrit 40.0, MCV 95.2,  platelets 222, CK-MB 1.3, troponin I less than 0.05.   Repeat CBC prior to discharge revealed a white count of 17.1.   EKG reveals normal sinus rhythm, normal EKG, heart rate of 70, chest x-ray  shows some cardiomegaly, no acute process noted.   HOSPITAL COURSE:  This patient was admitted to [facility] for  further evaluation.  The patient has undergone a carotid Doppler study  showing 40-60% internal carotid artery stenosis on the right, no internal  carotid artery stenosis noted on the left, right vertebral artery flow was  antegrade, left vertebral artery was not visualized.  The patient has been  unable to obtain an MRI scan of the brain due to significant body habitus.  The patient did undergo a 2D echocardiogram that showed no obvious source of  cardiogenic emboli.  This is not a technically adequate study due to the  patients size.  The patient is felt to have a left middle cerebral artery  distribution cerebrovascular infarction.  The patient was taken off of  aspirin, placed on Aggrenox.  The patient has complained of some headache,  especially with this medication.  Transcranial Dopplers have been completed,  the full results are not available at the time of this dictation.  Plans are  at this point to discharge the patient to home, will have an MRI of the  brain, MR angiogram done on the Tuesday following this discharge within the  next five days.  The patient will remain on Aggrenox 1 pill a day for 14  days and then go to 1 twice a day, Hyzaar  100/25 mg daily, Glucotrol 10 mg  XL1 a day, Actos 15 mg daily, homocystine levels and fasting lipid profile  are pending at this time.  This will need to be followed up when the patient  has her revisit through our office.  The patient is to remain on a diabetic  diet, no added salt, is to stop smoking.  The patient will need to follow up  with Dr. Everardo All for management of risk factors for stroke and will see Dr.  Brandon Melnick within the next 8 weeks.  A chest x-ray and a urinalysis will be  checked prior to discharge.  If this is unremarkable, the patient will be  discharged.  I am not clear what the cause of the elevated white count is at  this point.  The patient remains afebrile.       CKW/MEDQ  D:  04/24/2004  T:  04/24/2004  Job:  [id number]   cc:   Gregary Signs A. Everardo All, M.D. [facility]   Pramod P. Pearlean Brownie, MD  Fax: [phone]

## 2010-09-04 NOTE — H&P (Signed)
NAMECAERA, ENWRIGHT       ACCOUNT NO.:  [id number]   MEDICAL RECORD NO.:  [id number]          PATIENT TYPE:  INP   LOCATION:  1830                         FACILITY:  [facility]   PHYSICIAN:  Lowella Bandy, MD      DATE OF BIRTH:  18-Apr-1941   DATE OF ADMISSION:  03/28/2006  DATE OF DISCHARGE:                              HISTORY & PHYSICAL   PRIMARY CARE PHYSICIAN:  Sean A. Everardo All, M.D.   CHIEF COMPLAINT:  Chest pain and shortness of breath.   HISTORY OF PRESENT ILLNESS:  Ms. Beitz is a 70 year old morbidly  obese African-American female with a history of diabetes mellitus and  hyperlipidemia, with a cardiac catheterization in July of 2005 that  showed minimal coronary disease, who had an episode earlier today of  sharp, left-sided chest pain and some associated shortness of breath  while she was at the store.  The chest pain was not exertional, lasted  several minutes, and resolved on its own.  It did radiate to both arms  with some numbness and tingling in both hands.  The patient reports that  she had been feeling fine prior to this incident and denies any recent  episodes like this.  She denies any recent fevers.  She has had a  significant problem lately with labile blood sugars.   PAST MEDICAL HISTORY:  1. Diabetes mellitus.  2. Morbid obesity.  3. Hyperlipidemia.  4. 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  5. Gallstones.   PREVIOUS CARDIAC STUDIES:  Cardiac catheterization on October 28, 2003,  showed minimal coronary artery disease with one 30-40% lesion in the mid  RCA and otherwise normal coronary arteries with an ejection fraction of  65% and normal wall motion.  Of note, she was significantly hypertensive  on that cardiac catheterization.   MEDICATIONS:  1. NovoLog subcutaneous 35 units in the morning, 35 units with lunch,      and 25 units with dinner.  2. A cholesterol pill the  patient is unsure of the name or dose.  3. A pain pill the patient is unsure of the name or dose.  4. Lasix, the patient is unsure of the dose.   ALLERGIES:  The patient denies any drug allergies.   SOCIAL HISTORY:  The patient quit smoking 2 months ago, prior to that  had extensive smoking history.  She denies any alcohol or illicit drug  use.   FAMILY HISTORY:  Mother died at age 50 from complications of diabetes  mellitus.  Father died at age 35 from reported poisoning incident per  the patient.   REVIEW OF SYSTEMS:  As noted above she has had significant recent  problems with labile blood sugars with both hyperglycemia as well as  hypoglycemia.  She also has significant joint pain, as well as  persistent lower extremity edema.  Otherwise, 10 systems reviewed and  negative other than as noted above in the HPI.   PHYSICAL EXAMINATION:  VITAL SIGNS:  Blood pressure 128/61, pulse 75,  oxygen saturation 100% on 2 liter nasal  cannula, the patient was  afebrile.  GENERAL:  The patient is morbidly obese African-American female in no  acute distress breathing comfortably.  HEENT:  Sclerae anicteric, oropharynx clear.  NECK:  Supple, no masses appreciated, no JVD.  CARDIOVASCULAR:  Distant heart sounds, regular rate and rhythm, no  murmurs, rubs, or gallops appreciated.  CHEST:  Clear to auscultation bilaterally.  ABDOMEN:  Soft, nontender, and nondistended.  EXTREMITIES:  Warm, 1+ lower extremity edema on the right, trace lower  extremity edema on the left.  NEUROLOGY:  Alert and oriented x3.  Cranial nerves II-XII grossly  intact.  Moving all extremities well.  Strength 5/5 with no focal  deficits appreciated.  SKIN:  No rashes.  MUSCULOSKELETAL:  No joint effusions or erythema.   LABORATORY DATA:  Currently pending.  Of note, her blood has clotted  twice on previous efforts and the patient is a very difficult IV stick.   EKG which I personally reviewed shows normal sinus rhythm  at 84 beats  per minute, low QRS voltage, otherwise normal EKG.   ASSESSMENT/PLAN:  A 70 year old female with diabetes mellitus and  hyperlipidemia, but a relatively unremarkable cardiac catheterization 2  years ago, admitted with chest pain and shortness of breath.  Her  symptoms are largely atypical for acute coronary syndrome, particularly  in light of her catheterization results from 2 years ago.  Her initial  EKG is normal, and biomarkers are pending given difficulty with her lab  draws.   1. We will admit her for observation to Dr. Everardo All for ruling out of      myocardial infarction with serial cardiac enzymes.  2. We will initiate aspirin 325 mg p.o. daily.  Given relatively low      suspicion for acute coronary syndrome, we will not start      therapeutic heparin at this time.  3. Until we can determine exactly what statin she was on, we will      start her on Simvastatin 40 mg p.o. daily.  4. We will check a D-dimer as well as BNP for further evaluations of      other etiologies of shortness of breath.  Her morbid obesity seems      a likely contributing factor.  5. If she rules out for myocardial infarction, she likely can be      discharged later on March 29, 2006.  If her D-dimer is positive,      given her mild lower extremity edema, she may need further      evaluation with either lower extremity Dopplers and/or CT scan of      the chest for evaluation for pulmonary embolism.  For now we will      use subcutaneous heparin for DVT prophylaxis.  6. We will place her on Lantus long acting insulin as well as NovoLog      insulin with meals for glucose control.  The patient reports that      she has had some difficulty with her NovoLog pen and may need      additional diabetic teaching.      Lowella Bandy, MD  Electronically Signed     JJC/MEDQ  D:  03/29/2006  T:  03/29/2006  Job:  [id number]

## 2010-09-11 ENCOUNTER — Telehealth: Payer: Self-pay

## 2010-09-11 NOTE — Telephone Encounter (Signed)
RN [name] advise of Verbal ok

## 2010-09-11 NOTE — Telephone Encounter (Signed)
ok 

## 2010-09-11 NOTE — Telephone Encounter (Signed)
RN called requesting verbal orders for social worker for [employer], PT eval and a Neurosurgeon, Okay for verbal?

## 2010-09-16 ENCOUNTER — Other Ambulatory Visit: Payer: Self-pay | Admitting: Endocrinology

## 2010-09-21 DIAGNOSIS — I6990 Unspecified sequelae of unspecified cerebrovascular disease: Secondary | ICD-10-CM

## 2010-09-21 DIAGNOSIS — J449 Chronic obstructive pulmonary disease, unspecified: Secondary | ICD-10-CM

## 2010-09-21 DIAGNOSIS — E119 Type 2 diabetes mellitus without complications: Secondary | ICD-10-CM

## 2010-09-21 DIAGNOSIS — I10 Essential (primary) hypertension: Secondary | ICD-10-CM

## 2010-09-22 ENCOUNTER — Ambulatory Visit: Payer: BC Managed Care – PPO | Admitting: Endocrinology

## 2010-09-22 DIAGNOSIS — Z0289 Encounter for other administrative examinations: Secondary | ICD-10-CM

## 2010-10-13 ENCOUNTER — Ambulatory Visit (INDEPENDENT_AMBULATORY_CARE_PROVIDER_SITE_OTHER): Payer: Medicare Other | Admitting: Endocrinology

## 2010-10-13 ENCOUNTER — Encounter: Payer: Self-pay | Admitting: Endocrinology

## 2010-10-13 VITALS — BP 134/82 | HR 96 | Temp 97.2°F | Ht 66.0 in | Wt 374.4 lb

## 2010-10-13 DIAGNOSIS — R252 Cramp and spasm: Secondary | ICD-10-CM

## 2010-10-13 DIAGNOSIS — E876 Hypokalemia: Secondary | ICD-10-CM

## 2010-10-13 DIAGNOSIS — E109 Type 1 diabetes mellitus without complications: Secondary | ICD-10-CM

## 2010-10-13 MED ORDER — OXYCODONE-ACETAMINOPHEN 10-325 MG PO TABS: 1.0000 | ORAL_TABLET | ORAL | Status: DC | PRN

## 2010-10-13 NOTE — Progress Notes (Signed)
Subjective:    Patient ID: [name], female    DOB: 05-23-1940, 70 y.o.   MRN: [id number]  HPI no cbg record, but states cbg's are sometimes as low as the 60's, in the afternoon.  It is highest after breakfast, and after the evening meal.   Pt states of mild cramps in the arms and legs, and assoc numbness of the feet.  She says she was found to have decreased circulation in the feet, at podiatry, 2 mos ago.   Depression: she says wellbutrin did not help.  She smokes 1 ppd. Past Medical History  Diagnosis Date  . COLONIC POLYPS 09/13/2007  . DIABETES MELLITUS, TYPE I 10/29/2006  . HYPERCHOLESTEROLEMIA 02/25/2009  . HYPONATREMIA 04/22/2008  . HYPOKALEMIA 01/15/2008  . SMOKER 02/22/2008  . DEPRESSION 02/22/2008  . HYPERTENSION 12/20/2008  . 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 ALLERGIC RHINITIS 10/29/2006  . COPD 04/22/2009  . GERD 10/29/2006  . CIRRHOSIS 10/29/2006  . DEGENERATIVE JOINT DISEASE 06/15/2007  . KNEE PAIN, RIGHT 02/03/2010  . OSTEOPOROSIS 10/29/2006  . SEIZURE DISORDER 01/06/2010  . HYPERSOMNIA, ASSOCIATED WITH SLEEP APNEA 05/07/2009  . DYSPNEA 02/22/2008  . OTHER DYSPNEA AND RESPIRATORY ABNORMALITIES 04/23/2009  . CHEST PAIN 02/22/2008  . ASYMPTOMATIC POSTMENOPAUSAL STATUS 02/22/2008    Past Surgical History  Procedure Date  . Appendectomy   . Abdominal hysterectomy 1980's  . (r) leg broken     when she was 18  . Breast surgery 1986 or 1987    Breast reduction  . Catartact surgery     History   Social History  . Marital Status: Widowed    Spouse Name: N/A    Number of Children: N/A  . Years of Education: N/A   Occupational History  . Not on file.   Social History Main Topics  . Smoking status: Current Everyday Smoker  . Smokeless tobacco: Not on file  . Alcohol Use: No  . Drug Use: No  . Sexually Active: Not on file   Other Topics Concern  . Not on file   Social History Narrative  . No narrative on file    Current  Outpatient Prescriptions on File Prior to Visit  Medication Sig Dispense Refill  . amLODipine (NORVASC) 2.5 MG tablet Take 2.5 mg by mouth daily.        [name] aspirin 81 MG tablet Take 81 mg by mouth daily.        . budesonide-formoterol (SYMBICORT) 160-4.5 MCG/ACT inhaler Inhale 2 puffs into the lungs 2 (two) times daily.        [name] buPROPion (WELLBUTRIN XL) 300 MG 24 hr tablet Take 1 tablet (300 mg total) by mouth daily.  30 tablet  11  . furosemide (LASIX) 20 MG tablet Take 20 mg by mouth daily.        [name] glucose blood (TRUETRACK TEST) test strip 1 each by Other route as needed. Use as instructed       . Insulin Pen Needle (B-D ULTRAFINE III SHORT PEN) 31G X 8 MM MISC by Does not apply route as directed.        [name] losartan-hydrochlorothiazide (HYZAAR) 100-25 MG per tablet Take 1 tablet by mouth daily.        . metoCLOPramide (REGLAN) 10 MG tablet Take 10 mg by mouth every 6 (six) hours as needed.        . potassium chloride SA (K-DUR,KLOR-CON) 20 MEQ tablet Take 20 mEq by mouth daily.        [name]  simvastatin (ZOCOR) 40 MG tablet Take 40 mg by mouth at bedtime.        . triamcinolone (KENALOG) 0.1 % cream Apply topically 3 (three) times daily.          Allergies  Allergen Reactions  . Lisinopril     REACTION: Cough  . Pioglitazone     REACTION: Edema  . Varenicline Tartrate     REACTION: bad dreams    Family History  Problem Relation Age of Onset  . Cancer Mother   . Diabetes Mother   . Diabetes Other     BP 134/82  Pulse 96  Temp(Src) 97.2 F (36.2 C) (Oral)  Ht 5\' 6"  (1.676 m)  Wt 374 lb 6.4 oz (169.827 kg)  BMI 60.43 kg/m2  SpO2 91%  Review of Systems Denies loc and weight change    Objective:   Physical Exam GENERAL: no distress. Obese.  In wheelchair. Pulses: dorsalis pedis intact bilat.   Feet: no deformity.  no ulcer on the feet.  feet are of normal color and temp.  no edema Neuro: sensation is intact to touch on the feet.    Lab Results  Component Value Date    HGBA1C 7.6* 10/16/2010   Assessment & Plan:  Dm, this is the best control this pt should aim for, given this regimen, which does match insulin to her changing needs throughout the day. Leg sxs, uncertain etiology Depression, persistent.  Also persistent dmoking Change in rx is declined.

## 2010-10-13 NOTE — Patient Instructions (Addendum)
blood tests are being ordered for you today.  please call [phone] to hear your test results.  You will be prompted to enter the 9-digit "MRN" number that appears at the top left of this page, followed by #.  Then you will hear the message. pending the test results, please change levemir to 375 units am and 175 units in the evening. check your blood sugar 2 times a day.  vary the time of day when you check, between before the 3 meals, and at bedtime.  also check if you have symptoms of your blood sugar being too high or too low.  please keep a record of the readings and bring it to your next appointment here.  please call us sooner if you are having low blood sugar episodes. Please make a follow-up appointment in 3 months.   Please sign release of information for your circulation test.  Here are 3 months of pain medication.

## 2010-10-16 ENCOUNTER — Other Ambulatory Visit: Payer: Self-pay

## 2010-10-16 ENCOUNTER — Other Ambulatory Visit (INDEPENDENT_AMBULATORY_CARE_PROVIDER_SITE_OTHER): Payer: Medicare Other

## 2010-10-16 DIAGNOSIS — E109 Type 1 diabetes mellitus without complications: Secondary | ICD-10-CM

## 2010-10-16 DIAGNOSIS — N3000 Acute cystitis without hematuria: Secondary | ICD-10-CM

## 2010-10-16 DIAGNOSIS — R252 Cramp and spasm: Secondary | ICD-10-CM

## 2010-10-16 LAB — URINALYSIS, ROUTINE W REFLEX MICROSCOPIC
Bilirubin Urine: NEGATIVE
Leukocytes, UA: NEGATIVE
Nitrite: POSITIVE
Specific Gravity, Urine: >=1.03 (ref 1.000–1.030)
Total Protein, Urine: NEGATIVE
pH: 5.5 (ref 5.0–8.0)

## 2010-10-16 LAB — BASIC METABOLIC PANEL
GFR: 87.68 mL/min (ref >60.00)
Glucose, Bld: 207 mg/dL — ABNORMAL HIGH (ref 70–99)
Potassium: 4.1 mEq/L (ref 3.5–5.1)
Sodium: 138 mEq/L (ref 135–145)

## 2010-10-16 MED ORDER — INSULIN DETEMIR 100 UNIT/ML ~~LOC~~ SOLN: SUBCUTANEOUS | Status: DC

## 2010-11-09 ENCOUNTER — Other Ambulatory Visit: Payer: Self-pay | Admitting: *Deleted

## 2010-11-09 MED ORDER — INSULIN DETEMIR 100 UNIT/ML ~~LOC~~ SOLN: SUBCUTANEOUS | Status: DC

## 2010-11-10 ENCOUNTER — Other Ambulatory Visit: Payer: Self-pay | Admitting: *Deleted

## 2010-11-10 MED ORDER — INSULIN DETEMIR 100 UNIT/ML ~~LOC~~ SOLN: SUBCUTANEOUS | Status: DC

## 2010-11-10 NOTE — Telephone Encounter (Signed)
[facility] sent fax for refill for 30 day supply of Levemir. Per fax, previous rx was for 27 day supply.

## 2010-11-26 ENCOUNTER — Ambulatory Visit (INDEPENDENT_AMBULATORY_CARE_PROVIDER_SITE_OTHER): Payer: Medicare Other | Admitting: Endocrinology

## 2010-11-26 ENCOUNTER — Encounter: Payer: Self-pay | Admitting: Endocrinology

## 2010-11-26 DIAGNOSIS — L301 Dyshidrosis [pompholyx]: Secondary | ICD-10-CM

## 2010-11-26 DIAGNOSIS — I10 Essential (primary) hypertension: Secondary | ICD-10-CM

## 2010-11-26 DIAGNOSIS — E109 Type 1 diabetes mellitus without complications: Secondary | ICD-10-CM

## 2010-11-26 MED ORDER — TRIAMCINOLONE ACETONIDE 0.1 % EX CREA: TOPICAL_CREAM | Freq: Three times a day (TID) | CUTANEOUS | Status: DC

## 2010-11-26 MED ORDER — INSULIN DETEMIR 100 UNIT/ML ~~LOC~~ SOLN: SUBCUTANEOUS | Status: DC

## 2010-11-26 NOTE — Progress Notes (Signed)
Subjective:    Patient ID: [name], female    DOB: 01/19/1941, 70 y.o.   MRN: [id number]  HPI no cbg record, but states cbg's vary from 94-380.  It is in general higher as the day goes on.  pt states she feels well in general, except for fatigue with hyperglycemia Pt states 2 weeks of moderate itching throughout the body.  No assoc rash, but she has dry skin. Past Medical History  Diagnosis Date  . COLONIC POLYPS 09/13/2007  . DIABETES MELLITUS, TYPE I 10/29/2006  . HYPERCHOLESTEROLEMIA 02/25/2009  . HYPONATREMIA 04/22/2008  . HYPOKALEMIA 01/15/2008  . SMOKER 02/22/2008  . DEPRESSION 02/22/2008  . HYPERTENSION 12/20/2008  . 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 ALLERGIC RHINITIS 10/29/2006  . COPD 04/22/2009  . GERD 10/29/2006  . CIRRHOSIS 10/29/2006  . DEGENERATIVE JOINT DISEASE 06/15/2007  . KNEE PAIN, RIGHT 02/03/2010  . OSTEOPOROSIS 10/29/2006  . SEIZURE DISORDER 01/06/2010  . HYPERSOMNIA, ASSOCIATED WITH SLEEP APNEA 05/07/2009  . DYSPNEA 02/22/2008  . OTHER DYSPNEA AND RESPIRATORY ABNORMALITIES 04/23/2009  . CHEST PAIN 02/22/2008  . ASYMPTOMATIC POSTMENOPAUSAL STATUS 02/22/2008    Past Surgical History  Procedure Date  . Appendectomy   . Abdominal hysterectomy 1980's  . (r) leg broken     when she was 18  . Breast surgery 1986 or 1987    Breast reduction  . Catartact surgery     History   Social History  . Marital Status: Widowed    Spouse Name: N/A    Number of Children: N/A  . Years of Education: N/A   Occupational History  . Not on file.   Social History Main Topics  . Smoking status: Current Everyday Smoker  . Smokeless tobacco: Not on file  . Alcohol Use: No  . Drug Use: No  . Sexually Active: Not on file   Other Topics Concern  . Not on file   Social History Narrative  . No narrative on file    Current Outpatient Prescriptions on File Prior to Visit  Medication Sig Dispense Refill  . amLODipine (NORVASC) 2.5 MG tablet  Take 2.5 mg by mouth daily.        [name] aspirin 81 MG tablet Take 81 mg by mouth daily.        . budesonide-formoterol (SYMBICORT) 160-4.5 MCG/ACT inhaler Inhale 2 puffs into the lungs 2 (two) times daily.        [name] buPROPion (WELLBUTRIN XL) 300 MG 24 hr tablet Take 1 tablet (300 mg total) by mouth daily.  30 tablet  11  . furosemide (LASIX) 20 MG tablet Take 20 mg by mouth daily.        [name] glucose blood (TRUETRACK TEST) test strip 1 each by Other route as needed. Use as instructed       . Insulin Pen Needle (B-D ULTRAFINE III SHORT PEN) 31G X 8 MM MISC by Does not apply route as directed.        [name] losartan-hydrochlorothiazide (HYZAAR) 100-25 MG per tablet Take 1 tablet by mouth daily.        . metoCLOPramide (REGLAN) 10 MG tablet Take 10 mg by mouth every 6 (six) hours as needed.        [name] oxyCODONE-acetaminophen (PERCOCET) 10-325 MG per tablet Take 1 tablet by mouth every 4 (four) hours as needed. For pain  120 tablet  0  . potassium chloride SA (K-DUR,KLOR-CON) 20 MEQ tablet Take 20 mEq by mouth daily.        [name]  simvastatin (ZOCOR) 40 MG tablet Take 40 mg by mouth at bedtime.        . triamcinolone (KENALOG) 0.1 % cream Apply topically 3 (three) times daily.          Allergies  Allergen Reactions  . Lisinopril     REACTION: Cough  . Pioglitazone     REACTION: Edema  . Varenicline Tartrate     REACTION: bad dreams   Family History  Problem Relation Age of Onset  . Cancer Mother   . Diabetes Mother   . Diabetes Other    BP 148/90  Pulse 90  Temp(Src) 98.7 F (37.1 C) (Oral)  Ht 5\' 7"  (1.702 m)  Wt 359 lb 9.6 oz (163.113 kg)  BMI 56.32 kg/m2  SpO2 97%  Review of Systems denies hypoglycemia and sob    Objective:   Physical Exam GENERAL: no distress.  Obese SKIN:  Insulin injection sites at the anterior abdomen are normal There is patchy dyshidrosis.      Assessment & Plan:  Dm, needs increased rx Dyshidrosis, new Htn, with ? Of situational component.

## 2010-11-26 NOTE — Patient Instructions (Addendum)
pending the test results, please change levemir to 450 units am and 150 units in the evening. check your blood sugar 2 times a day.  vary the time of day when you check, between before the 3 meals, and at bedtime.  also check if you have symptoms of your blood sugar being too high or too low.  please keep a record of the readings and bring it to your next appointment here.  please call us sooner if you are having low blood sugar episodes.   Please make a follow-up appointment in 6 weeks.   i have sent a prescription to your pharmacy, for an anti-itch cream.

## 2010-12-24 ENCOUNTER — Other Ambulatory Visit: Payer: Self-pay | Admitting: Endocrinology

## 2011-01-08 LAB — BASIC METABOLIC PANEL
BUN: 11
BUN: 12
BUN: 6
BUN: 8
CO2: 28
CO2: 29
CO2: 31
Calcium: 8 — ABNORMAL LOW
Calcium: 8.1 — ABNORMAL LOW
Calcium: 8.3 — ABNORMAL LOW
Calcium: 8.4
Chloride: 100
Chloride: 108
Chloride: 99
Creatinine, Ser: 1.52 — ABNORMAL HIGH
Creatinine, Ser: 1.84 — ABNORMAL HIGH
Creatinine, Ser: 2.05 — ABNORMAL HIGH
Creatinine, Ser: 2.36 — ABNORMAL HIGH
GFR calc Af Amer: 25 — ABNORMAL LOW
GFR calc Af Amer: 33 — ABNORMAL LOW
GFR calc Af Amer: 53 — ABNORMAL LOW
GFR calc Af Amer: >60
GFR calc non Af Amer: 24 — ABNORMAL LOW
GFR calc non Af Amer: 34 — ABNORMAL LOW
GFR calc non Af Amer: 42 — ABNORMAL LOW
Glucose, Bld: 219 — ABNORMAL HIGH
Glucose, Bld: 77
Glucose, Bld: 83
Potassium: 2.9 — ABNORMAL LOW
Potassium: 3.4 — ABNORMAL LOW
Potassium: 4.2
Sodium: 128 — ABNORMAL LOW
Sodium: 130 — ABNORMAL LOW
Sodium: 137
Sodium: 138

## 2011-01-08 LAB — CBC
HCT: 29.6 — ABNORMAL LOW
HCT: 31.6 — ABNORMAL LOW
HCT: 37.4
Hemoglobin: 10.5 — ABNORMAL LOW
Hemoglobin: 12.2
Hemoglobin: 13.6
MCHC: 32.7
MCHC: 33.5
MCHC: 33.6
MCV: 95.2
MCV: 95.8
MCV: 96.2
Platelets: 206
Platelets: 219
Platelets: 253
Platelets: 390
RBC: 3.08 — ABNORMAL LOW
RBC: 3.2 — ABNORMAL LOW
RBC: 3.32 — ABNORMAL LOW
RDW: 12.8
RDW: 13
RDW: 13.2
WBC: 6.6
WBC: 8.8
WBC: 9.4
WBC: 9.9

## 2011-01-08 LAB — DIFFERENTIAL
Basophils Absolute: 0
Basophils Absolute: 0.1
Basophils Relative: 0
Basophils Relative: 1
Eosinophils Relative: 0
Lymphocytes Relative: 8 — ABNORMAL LOW
Monocytes Absolute: 1
Neutro Abs: 11.3 — ABNORMAL HIGH
Neutro Abs: 13.3 — ABNORMAL HIGH
Neutrophils Relative %: 85 — ABNORMAL HIGH

## 2011-01-08 LAB — URINE MICROSCOPIC-ADD ON

## 2011-01-08 LAB — I-STAT 8, (EC8 V) (CONVERTED LAB)
Acid-Base Excess: 6 — ABNORMAL HIGH
Chloride: 96
HCT: 45
Operator id: 198171
Potassium: 3 — ABNORMAL LOW
Sodium: 132 — ABNORMAL LOW
TCO2: 30
pH, Ven: 7.526 — ABNORMAL HIGH

## 2011-01-08 LAB — URINALYSIS, ROUTINE W REFLEX MICROSCOPIC
Bilirubin Urine: NEGATIVE
Leukocytes, UA: NEGATIVE
Nitrite: NEGATIVE
Specific Gravity, Urine: 1.025
pH: 6.5

## 2011-01-08 LAB — POCT I-STAT CREATININE: Operator id: 198171

## 2011-01-12 ENCOUNTER — Other Ambulatory Visit (INDEPENDENT_AMBULATORY_CARE_PROVIDER_SITE_OTHER): Payer: Medicare Other

## 2011-01-12 ENCOUNTER — Encounter: Payer: Self-pay | Admitting: Endocrinology

## 2011-01-12 ENCOUNTER — Ambulatory Visit (INDEPENDENT_AMBULATORY_CARE_PROVIDER_SITE_OTHER): Payer: Medicare Other | Admitting: Endocrinology

## 2011-01-12 DIAGNOSIS — M545 Low back pain, unspecified: Secondary | ICD-10-CM

## 2011-01-12 DIAGNOSIS — E109 Type 1 diabetes mellitus without complications: Secondary | ICD-10-CM

## 2011-01-12 DIAGNOSIS — Z79899 Other long term (current) drug therapy: Secondary | ICD-10-CM

## 2011-01-12 LAB — HEMOGLOBIN A1C: Hgb A1c MFr Bld: 7.5 % — ABNORMAL HIGH (ref 4.6–6.5)

## 2011-01-12 MED ORDER — OXYCODONE-ACETAMINOPHEN 10-325 MG PO TABS: 1.0000 | ORAL_TABLET | ORAL | Status: DC | PRN

## 2011-01-12 NOTE — Patient Instructions (Addendum)
pending the test results, please change levemir to 500 units am and 100 units in the evening. check your blood sugar 2 times a day.  vary the time of day when you check, between before the 3 meals, and at bedtime.  also check if you have symptoms of your blood sugar being too high or too low.  please keep a record of the readings and bring it to your next appointment here.  please call us sooner if you are having low blood sugar episodes.   Please make a follow-up appointment in 6 weeks. i'll do the form for the back brace.  blood tests are being requested for you today.  please call [phone] to hear your test results.  You will be prompted to enter the 9-digit "MRN" number that appears at the top left of this page, followed by #.  Then you will hear the message.   (update: i left message on phone-tree:  rx as we discussed)

## 2011-01-12 NOTE — Progress Notes (Signed)
Subjective:    Patient ID: [name], female    DOB: [birth year], 70 y.o.   MRN: [id number]  HPI Pt states many years of severe pain at the lower back, but no assoc numbness. no cbg record, but states cbg's vary from 52-550.  It is in general higher as the day goes on. Past Medical History  Diagnosis Date  . COLONIC POLYPS 09/13/2007  . DIABETES MELLITUS, TYPE I 10/29/2006  . HYPERCHOLESTEROLEMIA 02/25/2009  . HYPONATREMIA 04/22/2008  . HYPOKALEMIA 01/15/2008  . SMOKER 02/22/2008  . DEPRESSION 02/22/2008  . HYPERTENSION 12/20/2008  . 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 ALLERGIC RHINITIS 10/29/2006  . COPD 04/22/2009  . GERD 10/29/2006  . CIRRHOSIS 10/29/2006  . DEGENERATIVE JOINT DISEASE 06/15/2007  . KNEE PAIN, RIGHT 02/03/2010  . OSTEOPOROSIS 10/29/2006  . SEIZURE DISORDER 01/06/2010  . HYPERSOMNIA, ASSOCIATED WITH SLEEP APNEA 05/07/2009  . DYSPNEA 02/22/2008  . OTHER DYSPNEA AND RESPIRATORY ABNORMALITIES 04/23/2009  . CHEST PAIN 02/22/2008  . ASYMPTOMATIC POSTMENOPAUSAL STATUS 02/22/2008    Past Surgical History  Procedure Date  . Appendectomy   . Abdominal hysterectomy 1980's  . (r) leg broken     when she was 18  . Breast surgery 1986 or 1987    Breast reduction  . Catartact surgery     History   Social History  . Marital Status: Widowed    Spouse Name: N/A    Number of Children: N/A  . Years of Education: N/A   Occupational History  . Not on file.   Social History Main Topics  . Smoking status: Current Everyday Smoker  . Smokeless tobacco: Not on file  . Alcohol Use: No  . Drug Use: No  . Sexually Active: Not on file   Other Topics Concern  . Not on file   Social History Narrative  . No narrative on file    Current Outpatient Prescriptions on File Prior to Visit  Medication Sig Dispense Refill  . amLODipine (NORVASC) 2.5 MG tablet TAKE ONE TABLET BY MOUTH DAILY  90 tablet  2  . aspirin 81 MG tablet Take 81 mg by mouth daily.         . budesonide-formoterol (SYMBICORT) 160-4.5 MCG/ACT inhaler Inhale 2 puffs into the lungs 2 (two) times daily.        [name] buPROPion (WELLBUTRIN XL) 300 MG 24 hr tablet Take 1 tablet (300 mg total) by mouth daily.  30 tablet  11  . furosemide (LASIX) 20 MG tablet TAKE 1 TABLET BY MOUTH EVERY DAY  90 tablet  2  . glucose blood (TRUETRACK TEST) test strip 1 each by Other route as needed. Use as instructed       . Insulin Pen Needle (B-D ULTRAFINE III SHORT PEN) 31G X 8 MM MISC by Does not apply route as directed.        [name] losartan-hydrochlorothiazide (HYZAAR) 100-25 MG per tablet Take 1 tablet by mouth daily.        . metoCLOPramide (REGLAN) 10 MG tablet Take 10 mg by mouth every 6 (six) hours as needed.        . potassium chloride SA (K-DUR,KLOR-CON) 20 MEQ tablet TAKE 1 TABLET BY MOUTH EVERY DAY WITH FOOD  90 tablet  2  . simvastatin (ZOCOR) 40 MG tablet Take 40 mg by mouth at bedtime.        . triamcinolone (KENALOG) 0.1 % cream Apply topically 3 (three) times daily. Prn for itching  30 g  2    Allergies  Allergen Reactions  . Lisinopril     REACTION: Cough  . Pioglitazone     REACTION: Edema  . Varenicline Tartrate     REACTION: bad dreams    Family History  Problem Relation Age of Onset  . Cancer Mother   . Diabetes Mother   . Diabetes Other     BP 136/82  Pulse 98  Temp(Src) 98 F (36.7 C) (Oral)  Ht 5\' 7"  (1.702 m)  Wt 371 lb 6.4 oz (168.466 kg)  BMI 58.17 kg/m2  SpO2 93%    Review of Systems Denies bowel or bladder retention.    Objective:   Physical Exam VITAL SIGNS:  See vs page. GENERAL: no distress.  Morbid obesity. Spine: slight tenderness at the mid-lumbar area. Neuro: sensation is intact to touch on the legs Gait: slow but steady with a walker   Lab Results  Component Value Date   HGBA1C 7.5* 01/12/2011       Assessment & Plan:  Dm, this is the best control this pt should aim for, given this regimen, which does match insulin to her changing  needs throughout the day Low-back pain, persistent

## 2011-01-17 ENCOUNTER — Emergency Department (HOSPITAL_COMMUNITY): Payer: Medicare Other

## 2011-01-17 ENCOUNTER — Inpatient Hospital Stay (HOSPITAL_COMMUNITY)
Admission: EM | Admit: 2011-01-17 | Discharge: 2011-01-20 | DRG: 690 | Disposition: A | Discharge status: Home / Self Care | Payer: Medicare Other | Attending: Internal Medicine | Admitting: Internal Medicine

## 2011-01-17 DIAGNOSIS — F17200 Nicotine dependence, unspecified, uncomplicated: Secondary | ICD-10-CM | POA: Diagnosis present

## 2011-01-17 DIAGNOSIS — A498 Other bacterial infections of unspecified site: Secondary | ICD-10-CM | POA: Diagnosis present

## 2011-01-17 DIAGNOSIS — I252 Old myocardial infarction: Secondary | ICD-10-CM

## 2011-01-17 DIAGNOSIS — K5900 Constipation, unspecified: Secondary | ICD-10-CM | POA: Diagnosis present

## 2011-01-17 DIAGNOSIS — IMO0002 Reserved for concepts with insufficient information to code with codable children: Secondary | ICD-10-CM | POA: Diagnosis present

## 2011-01-17 DIAGNOSIS — J449 Chronic obstructive pulmonary disease, unspecified: Secondary | ICD-10-CM | POA: Diagnosis present

## 2011-01-17 DIAGNOSIS — I2510 Atherosclerotic heart disease of native coronary artery without angina pectoris: Secondary | ICD-10-CM | POA: Diagnosis present

## 2011-01-17 DIAGNOSIS — J4489 Other specified chronic obstructive pulmonary disease: Secondary | ICD-10-CM | POA: Diagnosis present

## 2011-01-17 DIAGNOSIS — E1165 Type 2 diabetes mellitus with hyperglycemia: Secondary | ICD-10-CM | POA: Diagnosis present

## 2011-01-17 DIAGNOSIS — N390 Urinary tract infection, site not specified: Principal | ICD-10-CM | POA: Diagnosis present

## 2011-01-17 DIAGNOSIS — G4733 Obstructive sleep apnea (adult) (pediatric): Secondary | ICD-10-CM | POA: Diagnosis present

## 2011-01-17 LAB — HEPATIC FUNCTION PANEL
AST: 12 U/L (ref 0–37)
Albumin: 3.1 g/dL — ABNORMAL LOW (ref 3.5–5.2)
Alkaline Phosphatase: 92 U/L (ref 39–117)
Total Bilirubin: 0.3 mg/dL (ref 0.3–1.2)

## 2011-01-17 LAB — CBC
Hemoglobin: 14 g/dL (ref 12.0–15.0)
RBC: 4.38 MIL/uL (ref 3.87–5.11)

## 2011-01-17 LAB — URINALYSIS, ROUTINE W REFLEX MICROSCOPIC
Glucose, UA: NEGATIVE mg/dL
Ketones, ur: NEGATIVE mg/dL
Leukocytes, UA: NEGATIVE
pH: 7 (ref 5.0–8.0)

## 2011-01-17 LAB — DIFFERENTIAL
Basophils Absolute: 0 10*3/uL (ref 0.0–0.1)
Basophils Relative: 0 % (ref 0–1)
Eosinophils Relative: 1 % (ref 0–5)
Lymphocytes Relative: 21 % (ref 12–46)
Neutro Abs: 5.6 10*3/uL (ref 1.7–7.7)

## 2011-01-17 LAB — BASIC METABOLIC PANEL
CO2: 29 mEq/L (ref 19–32)
Glucose, Bld: 223 mg/dL — ABNORMAL HIGH (ref 70–99)
Potassium: 4 mEq/L (ref 3.5–5.1)
Sodium: 135 mEq/L (ref 135–145)

## 2011-01-17 LAB — GLUCOSE, CAPILLARY: Glucose-Capillary: 253 mg/dL — ABNORMAL HIGH (ref 70–99)

## 2011-01-17 LAB — URINE MICROSCOPIC-ADD ON

## 2011-01-18 ENCOUNTER — Inpatient Hospital Stay (HOSPITAL_COMMUNITY): Payer: Medicare Other

## 2011-01-18 LAB — CBC
HCT: 43.1 % (ref 36.0–46.0)
Hemoglobin: 14.1 g/dL (ref 12.0–15.0)
MCH: 31.9 pg (ref 26.0–34.0)
MCHC: 32.7 g/dL (ref 30.0–36.0)
MCV: 97.5 fL (ref 78.0–100.0)
RDW: 13 % (ref 11.5–15.5)

## 2011-01-18 LAB — BASIC METABOLIC PANEL
BUN: 11 mg/dL (ref 6–23)
Creatinine, Ser: 0.73 mg/dL (ref 0.50–1.10)
GFR calc Af Amer: >90 mL/min (ref >90)
GFR calc non Af Amer: 85 mL/min — ABNORMAL LOW (ref >90)
Glucose, Bld: 89 mg/dL (ref 70–99)

## 2011-01-18 LAB — URINE MICROSCOPIC-ADD ON

## 2011-01-18 LAB — URINALYSIS, ROUTINE W REFLEX MICROSCOPIC
Glucose, UA: NEGATIVE mg/dL
Hgb urine dipstick: NEGATIVE
Ketones, ur: NEGATIVE mg/dL
Protein, ur: NEGATIVE mg/dL

## 2011-01-18 LAB — GLUCOSE, CAPILLARY: Glucose-Capillary: 99 mg/dL (ref 70–99)

## 2011-01-18 LAB — LIPASE, BLOOD: Lipase: 29 U/L (ref 11–59)

## 2011-01-18 MED ORDER — IOHEXOL 300 MG/ML  SOLN
100.0000 mL | Freq: Once | INTRAMUSCULAR | Status: AC | PRN
  Administered 2011-01-18: 100 mL via INTRAVENOUS

## 2011-01-18 NOTE — H&P (Signed)
[name], [name]       ACCOUNT NO.:  [id number]  MEDICAL RECORD NO.:  [id number]  LOCATION:  [facility]                         FACILITY:  [facility]  PHYSICIAN:  Gery Pray, MD      DATE OF BIRTH:  1940-09-02  DATE OF ADMISSION:  01/17/2011 DATE OF DISCHARGE:                             HISTORY & PHYSICAL   PRIMARY CARE PHYSICIAN:  Dr. Romero Belling.  CODE STATUS:  DNR.    The patient goes to Team[facility].  CHIEF COMPLAINT:  Nausea and vomiting.  HISTORY OF PRESENT ILLNESS:  This is a 69 year old female with multiple medical issues.  She states that over the past few days, she has had nausea and vomiting.  No hematemesis.  No diarrhea.  In fact, she more had constipation.  She states her sugars have been quite labile, going anywhere quickly from 80 to 380 and up to 550.  She reports some chills and some subjective fever.  She states she has a cough that is productive of yellow phlegm.  She reports no weakness.  No falls.  She states these symptoms have been going on for the past 2 days.  She reports she has no appetite.  She reports some pain in back and legs, however, these appear to be chronic.  She reports cramps full body; however, this also appears to be chronic.  She reports no burning on urination.  She reports chronic shortness of breath and wheezing.  She has limited ambulation, some occasional GERD, and recent weight gain.  She is up to 375 pounds. She came into the ER specifically for the nausea and vomiting and the labile blood sugars.  History obtained from the patient. Son at bedside.  REVIEW OF SYSTEMS:  All 10-point systems reviewed are negative except as noted in HPI.  PAST MEDICAL HISTORY:  Includes history of CHF, hypertension, diabetes mellitus, chronic pain, morbid obesity, history of MIs, CVA, obstructive sleep apnea.  PAST SURGICAL HISTORY:  Significant for appendectomy.  MEDICATIONS: 1. Norvasc 2.5 mg daily. 2. Aspirin 81 mg daily. 3. Hyzaar  100/25 p.o. daily. 4. Klor-Con 20 mEq daily. 5. Kenalog p.r.n. 6. Lasix 20 mg daily. 7. Percocet 10/325 q.6 hours p.r.n. 8. Reglan 10 mg q.6 hours. 9. Zocor 40 mg q.hour of sleep. 10.Symbicort inhaler twice daily. 11.Wellbutrin 300 mg  p.o. daily.  The patient states she is on Levemir 500 units subcu q.a.m. and 100 units subcu q.p.m.  (I did verify this by having her show me how much she said she takes on her levimir pen).  ALLERGIES:  No known drug allergies.  SOCIAL HISTORY:  Negative for thyroid  SOCIAL HISTORY:  Positive tobacco.  Negative for alcohol and illicit drugs.  She is on home oxygen.  She does use a CPAP machine.  She has a walker and a power chair.  She is at fall risk.  She lives alone.  Her son, Terri Rorrer is at her bedside.  FAMILY HISTORY:  Significant for diabetes mellitus, coronary artery disease, and CVA.  PHYSICAL EXAMINATION:  VITAL SIGNS:  Blood pressure 140/59, pulse 79, respirations 20, temperature 98.1, sat 98% on room air. GENERAL:  Alert, oriented female currently in no acute distress. EYES:  Pink conjunctivae.  PERRLA.  ENT:  Moist oral mucosa.  Trachea midline. NECK:  Supple.  No thyromegaly. LUNGS:  Clear to auscultation bilaterally.  No wheeze appreciated. CARDIOVASCULAR:  Regular rate and rhythm without murmurs, rubs, or gallops. ABDOMEN:  Obese, large pannus.  Soft, positive bowel sounds. Nontender, nondistended. NEURO:  Cranial nerves II through XII grossly intact.  Sensation intact. MUSCULOSKELETAL:  Strength 5/5 in all extremities.  No clubbing, cyanosis.  No significant edema appreciated. SKIN:  The patient has no rashes.  No subcutaneous crepitation.  She denies any decubitus. No significant fungal rash under panus or breast.  LABORATORY DATA:  UA; cath specimen is cloudy, nitrites positive, leukocyte esterase negative, many bacteria.  Chest x-ray; no acute cardiopulmonary findings.  Abdominal ultrasound; limited exam due  to the patient's obesity, cholelithiasis without sonographic evidence of acute cholecystitis.  LFTs are normal.  Lipase is 29.  Sodium 135, potassium 4, chloride 99, CO2 29, glucose 223, BUN 12, creatinine 0.8.  White blood count 8.0, hemoglobin 14, platelets 220.  ASSESSMENT AND PLAN: 1. Nausea and vomiting. 2. Likely urinary tract infection.  The patient's urine does have many     bacteria, however, there is no evidence of white's; this is a cath     specimen.  Given the fact that she does have nausea and vomiting,     we are just going to go ahead and give her some empiric antibiotics     and wait on cultures to result.  We will make sure we have blood     cultures.  Give her Protonix and antiemetics. 3. Tobacco abuse.  Nicotine patch will be ordered.  We will evaluate     the patient's need for home oxygen. 4. Morbid obesity, BMI greater than 40.  Bariatric bed will be needed.     The patient is at fall risk.  She is deconditioned. 5. Diabetes mellitus, currently labile.  No marked episode of     hypoglycemia.  The patient is on a very increased dose of Lantus as     she says she takes 500 units in the morning and 100 units in the     evening.  I have decreased this to 400 units subcu daily.  She will     be on ADA diet.  The patient states she does not follow ADA diet at     home.  We will monitor closely for hypoglycemia. 6. Hypertension. 7. History of myocardial infarctions and coronary artery disease.     Resume home medication. 8. Obstructive sleep apnea.  The patient used to be on a CPAP machine;     however, she states she does not use this at home.  This has not     been ordered.          ______________________________ Gery Pray, MD     DC/MEDQ  D:  01/18/2011  T:  01/18/2011  Job:  [id number]  Electronically Signed by Gery Pray MD on 01/18/2011 05:51:29 AM

## 2011-01-19 LAB — URINE CULTURE
Colony Count: 50000
Culture  Setup Time: 201210010155

## 2011-01-19 LAB — GLUCOSE, CAPILLARY
Glucose-Capillary: 186 mg/dL — ABNORMAL HIGH (ref 70–99)
Glucose-Capillary: 138 mg/dL — ABNORMAL HIGH (ref 70–99)
Glucose-Capillary: 201 mg/dL — ABNORMAL HIGH (ref 70–99)
Glucose-Capillary: 178 mg/dL — ABNORMAL HIGH (ref 70–99)
Glucose-Capillary: 297 mg/dL — ABNORMAL HIGH (ref 70–99)

## 2011-01-19 LAB — CARDIAC PANEL(CRET KIN+CKTOT+MB+TROPI)
CK, MB: 2.1 ng/mL (ref 0.3–4.0)
Total CK: 137 U/L (ref 7–177)

## 2011-01-20 LAB — BASIC METABOLIC PANEL
BUN: 9 mg/dL (ref 6–23)
Calcium: 9.1 mg/dL (ref 8.4–10.5)
Creatinine, Ser: 0.71 mg/dL (ref 0.50–1.10)
GFR calc Af Amer: >90 mL/min (ref >90)
GFR calc non Af Amer: 86 mL/min — ABNORMAL LOW (ref >90)
Glucose, Bld: 245 mg/dL — ABNORMAL HIGH (ref 70–99)

## 2011-01-20 LAB — CBC
HCT: 40.3 % (ref 36.0–46.0)
Hemoglobin: 13.2 g/dL (ref 12.0–15.0)
MCH: 32 pg (ref 26.0–34.0)
MCHC: 32.8 g/dL (ref 30.0–36.0)
MCV: 97.6 fL (ref 78.0–100.0)
RDW: 12.9 % (ref 11.5–15.5)

## 2011-01-20 LAB — URINE CULTURE

## 2011-01-20 LAB — GLUCOSE, CAPILLARY

## 2011-01-22 LAB — CARDIAC PANEL(CRET KIN+CKTOT+MB+TROPI)
Total CK: 147 U/L (ref 7–177)
Troponin I: 0.01 ng/mL (ref 0.00–0.06)
Troponin I: 0.03 ng/mL (ref 0.00–0.06)

## 2011-01-22 LAB — URINALYSIS, ROUTINE W REFLEX MICROSCOPIC
Glucose, UA: 250 mg/dL — AB
Ketones, ur: NEGATIVE mg/dL
Specific Gravity, Urine: 1.018 (ref 1.005–1.030)
pH: 6 (ref 5.0–8.0)

## 2011-01-22 LAB — POCT I-STAT, CHEM 8
Chloride: 96 mEq/L (ref 96–112)
Creatinine, Ser: 1.2 mg/dL (ref 0.4–1.2)
Glucose, Bld: 399 mg/dL — ABNORMAL HIGH (ref 70–99)
Hemoglobin: 14.6 g/dL (ref 12.0–15.0)
Potassium: 3.2 mEq/L — ABNORMAL LOW (ref 3.5–5.1)
Sodium: 138 mEq/L (ref 135–145)

## 2011-01-22 LAB — CBC
HCT: 36.4 % (ref 36.0–46.0)
HCT: 36.8 % (ref 36.0–46.0)
HCT: 39.2 % (ref 36.0–46.0)
HCT: 40.8 % (ref 36.0–46.0)
Hemoglobin: 12.2 g/dL (ref 12.0–15.0)
Hemoglobin: 13.6 g/dL (ref 12.0–15.0)
MCHC: 33.4 g/dL (ref 30.0–36.0)
MCHC: 33.5 g/dL (ref 30.0–36.0)
MCV: 98.2 fL (ref 78.0–100.0)
MCV: 98.9 fL (ref 78.0–100.0)
Platelets: 164 10*3/uL (ref 150–400)
Platelets: 186 10*3/uL (ref 150–400)
RBC: 3.7 MIL/uL — ABNORMAL LOW (ref 3.87–5.11)
RBC: 4.13 MIL/uL (ref 3.87–5.11)
RDW: 12.6 % (ref 11.5–15.5)
RDW: 13.1 % (ref 11.5–15.5)
WBC: 5.3 10*3/uL (ref 4.0–10.5)
WBC: 5.6 10*3/uL (ref 4.0–10.5)

## 2011-01-22 LAB — GLUCOSE, CAPILLARY
Glucose-Capillary: 267 mg/dL — ABNORMAL HIGH (ref 70–99)
Glucose-Capillary: 293 mg/dL — ABNORMAL HIGH (ref 70–99)
Glucose-Capillary: 352 mg/dL — ABNORMAL HIGH (ref 70–99)

## 2011-01-22 LAB — COMPREHENSIVE METABOLIC PANEL
Albumin: 3.1 g/dL — ABNORMAL LOW (ref 3.5–5.2)
BUN: 14 mg/dL (ref 6–23)
Calcium: 9 mg/dL (ref 8.4–10.5)
Chloride: 100 mEq/L (ref 96–112)
Creatinine, Ser: 0.85 mg/dL (ref 0.4–1.2)
GFR calc Af Amer: >60 mL/min (ref >60)
Total Bilirubin: 0.9 mg/dL (ref 0.3–1.2)
Total Protein: 6.2 g/dL (ref 6.0–8.3)

## 2011-01-22 LAB — POCT CARDIAC MARKERS
CKMB, poc: 1.7 ng/mL (ref 1.0–8.0)
Myoglobin, poc: 93.5 ng/mL (ref 12–200)
Troponin i, poc: <0.05 ng/mL (ref 0.00–0.09)

## 2011-01-22 LAB — LIPASE, BLOOD: Lipase: 27 U/L (ref 11–59)

## 2011-01-22 LAB — HEPATIC FUNCTION PANEL
ALT: 15 U/L (ref 0–35)
AST: 22 U/L (ref 0–37)
Bilirubin, Direct: 0.2 mg/dL (ref 0.0–0.3)
Indirect Bilirubin: 0.5 mg/dL (ref 0.3–0.9)
Total Bilirubin: 0.7 mg/dL (ref 0.3–1.2)

## 2011-01-22 LAB — URINE MICROSCOPIC-ADD ON

## 2011-01-22 LAB — DIFFERENTIAL
Basophils Relative: 1 % (ref 0–1)
Eosinophils Relative: 1 % (ref 0–5)
Lymphocytes Relative: 28 % (ref 12–46)
Monocytes Absolute: 0.5 10*3/uL (ref 0.1–1.0)
Monocytes Relative: 6 % (ref 3–12)
Neutro Abs: 5.4 10*3/uL (ref 1.7–7.7)

## 2011-01-22 LAB — BASIC METABOLIC PANEL
BUN: 9 mg/dL (ref 6–23)
Chloride: 96 mEq/L (ref 96–112)
Glucose, Bld: 277 mg/dL — ABNORMAL HIGH (ref 70–99)
Potassium: 3.3 mEq/L — ABNORMAL LOW (ref 3.5–5.1)

## 2011-01-22 LAB — AMYLASE
Amylase: 58 U/L (ref 27–131)
Amylase: 59 U/L (ref 27–131)

## 2011-01-24 LAB — CULTURE, BLOOD (ROUTINE X 2)
Culture  Setup Time: 201210010928
Culture: NO GROWTH
Culture: NO GROWTH

## 2011-01-26 NOTE — Discharge Summary (Signed)
  [name], [name]       ACCOUNT NO.:  [id number]  MEDICAL RECORD NO.:  [id number]  LOCATION:  5029                         FACILITY:  [facility]  PHYSICIAN:  Lonia Blood, M.D.       DATE OF BIRTH:  Oct 20, 1940  DATE OF ADMISSION:  01/17/2011 DATE OF DISCHARGE:  01/20/2011                              DISCHARGE SUMMARY   PRIMARY CARE PHYSICIAN:  Sean A. Everardo All, MD  DISCHARGE DIAGNOSES: 1. Nausea, vomiting, abdominal pain, probably due to urinary tract     infection - resolved. 2. Escherichia coli urinary tract infection. 3. Uncontrolled diabetes mellitus type 2. 4. Morbid obesity. 5. Obstructive sleep apnea. 6. Hypertension. 7. Ongoing tobacco abuse with chronic obstructive pulmonary disease.  DISCHARGE MEDICATIONS: 1. Aspirin 81 mg daily. 2. Cefuroxime 500 mg by mouth twice a day for 4 days. 3. Hyzaar 100/25 mg daily. 4. Kenalog topical 3 times a day as needed. 5. Lasix 20 mg daily. 6. Levemir 100 units in the morning and 500 units at bedtime. 7. Nicotine 14 mg patch daily. 8. Norvasc 2.5 mg daily. 9. Oxycodone/Tylenol 10/325 one tablet every 4 hours as needed for     pain. 10.Potassium chloride 20 mEq daily 11.Reglan 10 mg every 6 hours needed for nausea. 12.Symbicort 160/4.5 two puffs twice a day. 13.Wellbutrin extended release 300 mg daily. 14.Zocor 40 mg daily.  CONSULTATION ON ADMISSION:  No consultation obtained.  PROCEDURES:  The patient underwent CT scan of the abdomen and pelvis on January 19, 2011.  Findings of cholelithiasis without cholecystitis.  No evidence for small bowel obstruction.  HOSPITAL COURSE:  [name] is a 70 year old woman with multiple medical problems including diabetes, obstructive sleep apnea, morbid obesity after she presented with nausea, vomiting and some abdominal pain.  Her initial CT scan did not show any acute surgical findings. Her abdominal exam was benign.  Eventually, became apparent that she was suffering from  urinary tract infection, for which she was treated with intravenous Rocephin.  The antibiotic treatment resulting in improvement in the patient's symptoms.  She was able to tolerate a regular diet.  She was to resume on her home medications including her high dose of insulin.  By hospital day #3, she was hemodynamically stable and ready for discharge home.  Plan is to continue 4 more days of antibiotics to complete 1-week course.     Lonia Blood, M.D.     SL/MEDQ  D:  01/21/2011  T:  01/21/2011  Job:  [id number]  cc:   Gregary Signs A. Everardo All, MD  Electronically Signed by Lonia Blood M.D. on 01/26/2011 05:07:52 PM

## 2011-01-28 LAB — DIFFERENTIAL
Basophils Relative: 0
Lymphs Abs: 2.9
Monocytes Relative: 6
Neutro Abs: 7.7
Neutrophils Relative %: 68

## 2011-01-28 LAB — POCT CARDIAC MARKERS
CKMB, poc: <1 — ABNORMAL LOW
Myoglobin, poc: 71.4
Myoglobin, poc: 81.5
Operator id: 4533
Operator id: 4533

## 2011-01-28 LAB — BASIC METABOLIC PANEL
BUN: 15
Calcium: 9.4
Creatinine, Ser: 1.12
GFR calc Af Amer: 59 — ABNORMAL LOW

## 2011-01-28 LAB — B-NATRIURETIC PEPTIDE (CONVERTED LAB): Pro B Natriuretic peptide (BNP): <30

## 2011-01-28 LAB — CBC
Platelets: 227
RBC: 4.61
WBC: 11.3 — ABNORMAL HIGH

## 2011-01-29 ENCOUNTER — Emergency Department (HOSPITAL_COMMUNITY): Payer: Medicare Other

## 2011-01-29 ENCOUNTER — Observation Stay (HOSPITAL_COMMUNITY)
Admission: EM | Admit: 2011-01-29 | Discharge: 2011-01-31 | Disposition: A | Discharge status: Home / Self Care | Payer: Medicare Other | Attending: Internal Medicine | Admitting: Internal Medicine

## 2011-01-29 DIAGNOSIS — G4733 Obstructive sleep apnea (adult) (pediatric): Secondary | ICD-10-CM | POA: Insufficient documentation

## 2011-01-29 DIAGNOSIS — IMO0002 Reserved for concepts with insufficient information to code with codable children: Secondary | ICD-10-CM | POA: Insufficient documentation

## 2011-01-29 DIAGNOSIS — R0989 Other specified symptoms and signs involving the circulatory and respiratory systems: Secondary | ICD-10-CM | POA: Insufficient documentation

## 2011-01-29 DIAGNOSIS — E1165 Type 2 diabetes mellitus with hyperglycemia: Secondary | ICD-10-CM | POA: Insufficient documentation

## 2011-01-29 DIAGNOSIS — R079 Chest pain, unspecified: Principal | ICD-10-CM | POA: Insufficient documentation

## 2011-01-29 DIAGNOSIS — E785 Hyperlipidemia, unspecified: Secondary | ICD-10-CM | POA: Insufficient documentation

## 2011-01-29 DIAGNOSIS — I10 Essential (primary) hypertension: Secondary | ICD-10-CM | POA: Insufficient documentation

## 2011-01-29 DIAGNOSIS — R0609 Other forms of dyspnea: Secondary | ICD-10-CM | POA: Insufficient documentation

## 2011-01-29 LAB — COMPREHENSIVE METABOLIC PANEL
AST: 20 U/L (ref 0–37)
Albumin: 3.3 g/dL — ABNORMAL LOW (ref 3.5–5.2)
Alkaline Phosphatase: 99 U/L (ref 39–117)
Chloride: 98 mEq/L (ref 96–112)
Creatinine, Ser: 0.89 mg/dL (ref 0.50–1.10)
Potassium: 4.7 mEq/L (ref 3.5–5.1)
Sodium: 137 mEq/L (ref 135–145)
Total Bilirubin: 0.4 mg/dL (ref 0.3–1.2)

## 2011-01-29 LAB — PHOSPHORUS: Phosphorus: 3.8 mg/dL (ref 2.3–4.6)

## 2011-01-29 LAB — PROTIME-INR: INR: 0.96 (ref 0.00–1.49)

## 2011-01-29 LAB — HEMOGLOBIN A1C: Mean Plasma Glucose: 177 mg/dL — ABNORMAL HIGH (ref <117)

## 2011-01-29 LAB — TSH: TSH: 0.442 u[IU]/mL (ref 0.350–4.500)

## 2011-01-29 LAB — CBC
HCT: 43.8 % (ref 36.0–46.0)
Hemoglobin: 14.3 g/dL (ref 12.0–15.0)
MCH: 31.6 pg (ref 26.0–34.0)
MCV: 96.7 fL (ref 78.0–100.0)
RBC: 4.53 MIL/uL (ref 3.87–5.11)

## 2011-01-29 LAB — GLUCOSE, CAPILLARY
Glucose-Capillary: 297 mg/dL — ABNORMAL HIGH (ref 70–99)
Glucose-Capillary: 322 mg/dL — ABNORMAL HIGH (ref 70–99)

## 2011-01-29 LAB — DIFFERENTIAL
Lymphs Abs: 1.7 10*3/uL (ref 0.7–4.0)
Monocytes Relative: 7 % (ref 3–12)
Neutro Abs: 5.2 10*3/uL (ref 1.7–7.7)
Neutrophils Relative %: 69 % (ref 43–77)

## 2011-01-29 LAB — CARDIAC PANEL(CRET KIN+CKTOT+MB+TROPI): Total CK: 104 U/L (ref 7–177)

## 2011-01-29 LAB — MAGNESIUM: Magnesium: 1.8 mg/dL (ref 1.5–2.5)

## 2011-01-29 LAB — APTT: aPTT: 27 seconds (ref 24–37)

## 2011-01-30 LAB — CARDIAC PANEL(CRET KIN+CKTOT+MB+TROPI)
CK, MB: 1.9 ng/mL (ref 0.3–4.0)
Relative Index: INVALID (ref 0.0–2.5)
Total CK: 84 U/L (ref 7–177)
Troponin I: <0.3 ng/mL (ref <0.30)
Troponin I: <0.3 ng/mL (ref <0.30)

## 2011-01-30 LAB — CBC
HCT: 39.8 % (ref 36.0–46.0)
Hemoglobin: 12.9 g/dL (ref 12.0–15.0)
MCH: 31.3 pg (ref 26.0–34.0)
MCHC: 32.4 g/dL (ref 30.0–36.0)
MCV: 96.6 fL (ref 78.0–100.0)

## 2011-01-30 LAB — BASIC METABOLIC PANEL
BUN: 12 mg/dL (ref 6–23)
Calcium: 9 mg/dL (ref 8.4–10.5)
Creatinine, Ser: 0.68 mg/dL (ref 0.50–1.10)
GFR calc non Af Amer: 87 mL/min — ABNORMAL LOW (ref >90)
Glucose, Bld: 205 mg/dL — ABNORMAL HIGH (ref 70–99)
Sodium: 136 mEq/L (ref 135–145)

## 2011-01-30 LAB — GLUCOSE, CAPILLARY: Glucose-Capillary: 242 mg/dL — ABNORMAL HIGH (ref 70–99)

## 2011-01-30 LAB — LIPID PANEL: LDL Cholesterol: 81 mg/dL (ref 0–99)

## 2011-01-31 LAB — GLUCOSE, CAPILLARY: Glucose-Capillary: 246 mg/dL — ABNORMAL HIGH (ref 70–99)

## 2011-02-07 NOTE — Discharge Summary (Signed)
  NAMEMARGUERITA, [name]       ACCOUNT NO.:  [id number]  MEDICAL RECORD NO.:  [id number]  LOCATION:  3703                         FACILITY:  [facility]  PHYSICIAN:  Manson Passey, MD        DATE OF BIRTH:  08-19-1940  DATE OF ADMISSION:  01/29/2011 DATE OF DISCHARGE:  01/31/2011                              DISCHARGE SUMMARY   This is a discharge addendum to final discharge date of January 31, 2011.          ______________________________ Manson Passey, MD     AD/MEDQ  D:  01/31/2011  T:  01/31/2011  Job:  [id number]  Electronically Signed by Manson Passey MD on 02/07/2011 04:11:49 PM

## 2011-02-11 ENCOUNTER — Ambulatory Visit: Payer: Medicare Other | Admitting: Endocrinology

## 2011-02-22 ENCOUNTER — Ambulatory Visit (INDEPENDENT_AMBULATORY_CARE_PROVIDER_SITE_OTHER): Payer: Medicare Other | Admitting: Endocrinology

## 2011-02-22 VITALS — BP 126/84 | HR 91 | Temp 98.0°F | Ht 69.0 in | Wt 374.6 lb

## 2011-02-22 DIAGNOSIS — E109 Type 1 diabetes mellitus without complications: Secondary | ICD-10-CM

## 2011-02-22 DIAGNOSIS — R110 Nausea: Secondary | ICD-10-CM

## 2011-02-22 DIAGNOSIS — R609 Edema, unspecified: Secondary | ICD-10-CM

## 2011-02-22 NOTE — Progress Notes (Signed)
Subjective:    Patient ID: [name], female    DOB: Feb 22, 1941, 70 y.o.   MRN: [id number]  HPI DM: last a1c was in the 7's.  However, she reports large fluctuations: 50-500.  There is no trend throughout the day.  She can even have hypoglycemia in the middle of the night.   Pt states few mos of moderate nausea sensation in the abdomen, and slight assoc pain.  She has been in the hospital several times, over this time span.  Past Medical History  Diagnosis Date  . COLONIC POLYPS 09/13/2007  . DIABETES MELLITUS, TYPE I 10/29/2006  . HYPERCHOLESTEROLEMIA 02/25/2009  . HYPONATREMIA 04/22/2008  . HYPOKALEMIA 01/15/2008  . SMOKER 02/22/2008  . DEPRESSION 02/22/2008  . HYPERTENSION 12/20/2008  . 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 ALLERGIC RHINITIS 10/29/2006  . COPD 04/22/2009  . GERD 10/29/2006  . CIRRHOSIS 10/29/2006  . DEGENERATIVE JOINT DISEASE 06/15/2007  . KNEE PAIN, RIGHT 02/03/2010  . OSTEOPOROSIS 10/29/2006  . SEIZURE DISORDER 01/06/2010  . HYPERSOMNIA, ASSOCIATED WITH SLEEP APNEA 05/07/2009  . DYSPNEA 02/22/2008  . OTHER DYSPNEA AND RESPIRATORY ABNORMALITIES 04/23/2009  . CHEST PAIN 02/22/2008  . ASYMPTOMATIC POSTMENOPAUSAL STATUS 02/22/2008    Past Surgical History  Procedure Date  . Appendectomy   . Abdominal hysterectomy 1980's  . (r) leg broken     when she was 18  . Breast surgery 1986 or 1987    Breast reduction  . Catartact surgery     History   Social History  . Marital Status: Widowed    Spouse Name: N/A    Number of Children: N/A  . Years of Education: N/A   Occupational History  . Not on file.   Social History Main Topics  . Smoking status: Current Everyday Smoker  . Smokeless tobacco: Not on file  . Alcohol Use: No  . Drug Use: No  . Sexually Active: Not on file   Other Topics Concern  . Not on file   Social History Narrative  . No narrative on file    Current Outpatient Prescriptions on File Prior to Visit    Medication Sig Dispense Refill  . amLODipine (NORVASC) 2.5 MG tablet TAKE ONE TABLET BY MOUTH DAILY  90 tablet  2  . aspirin 81 MG tablet Take 81 mg by mouth daily.        . budesonide-formoterol (SYMBICORT) 160-4.5 MCG/ACT inhaler Inhale 2 puffs into the lungs 2 (two) times daily.        [name] buPROPion (WELLBUTRIN XL) 300 MG 24 hr tablet Take 1 tablet (300 mg total) by mouth daily.  30 tablet  11  . furosemide (LASIX) 20 MG tablet TAKE 1 TABLET BY MOUTH EVERY DAY  90 tablet  2  . glucose blood (TRUETRACK TEST) test strip 1 each by Other route as needed. Use as instructed       . insulin detemir (LEVEMIR) 100 UNIT/ML injection 500 units each morning, and 50 units in the evening      . Insulin Pen Needle (B-D ULTRAFINE III SHORT PEN) 31G X 8 MM MISC by Does not apply route as directed.        [name] losartan-hydrochlorothiazide (HYZAAR) 100-25 MG per tablet Take 1 tablet by mouth daily.        . metoCLOPramide (REGLAN) 10 MG tablet Take 10 mg by mouth every 6 (six) hours as needed.        [name] oxyCODONE-acetaminophen (PERCOCET) 10-325 MG per tablet Take  1 tablet by mouth every 4 (four) hours as needed. For pain  120 tablet  0  . potassium chloride SA (K-DUR,KLOR-CON) 20 MEQ tablet TAKE 1 TABLET BY MOUTH EVERY DAY WITH FOOD  90 tablet  2  . simvastatin (ZOCOR) 40 MG tablet Take 40 mg by mouth at bedtime.        . triamcinolone (KENALOG) 0.1 % cream Apply topically 3 (three) times daily. Prn for itching  30 g  2    Allergies  Allergen Reactions  . Lisinopril     REACTION: Cough  . Pioglitazone     REACTION: Edema  . Varenicline Tartrate     REACTION: bad dreams    Family History  Problem Relation Age of Onset  . Cancer Mother   . Diabetes Mother   . Diabetes Other    BP 126/84  Pulse 91  Temp(Src) 98 F (36.7 C) (Oral)  Ht 5\' 9"  (1.753 m)  Wt 374 lb 9.6 oz (169.917 kg)  BMI 55.32 kg/m2  SpO2 96%  Review of Systems Denies loc, but she has constipation.      Objective:   Physical  Exam VITAL SIGNS:  See vs page GENERAL: no distress.  Morbid obesity--this limits exam ABDOMEN:  abdomen is soft, nontender.  no hepatosplenomegaly.  not distended.  no hernia Pulses: dorsalis pedis intact bilat.   Feet: no deformity.  no ulcer on the feet.  feet are of normal color and temp.  1+ bilat leg edema Neuro: sensation is intact to touch on the feet    (lipase was normal in the hospital)    Assessment & Plan:  Nausea, new, uncertain etiology Edema, mild, but pt doesn't qualify for diabetic shoes.  Dm, The pattern of his cbg's indicates he needs some adjustment in his therapy

## 2011-02-22 NOTE — Patient Instructions (Addendum)
recuce levemir to 500 units am and 50 units in the evening. check your blood sugar 2 times a day.  vary the time of day when you check, between before the 3 meals, and at bedtime.  also check if you have symptoms of your blood sugar being too high or too low.  please keep a record of the readings and bring it to your next appointment here.  please call us sooner if you are having low blood sugar episodes.   Please make a follow-up appointment in 1 month.  Our next step will be to eliminate the lows, then consider see when it goes highest.   Let's check a special but easy type of stomach x-ray.  you will receive a phone call, about a day and time for an appointmentplease call [phone] to hear your test results.  You will be prompted to enter the 9-digit "MRN" number that appears at the top left of this page, followed by #.  Then you will hear the message.

## 2011-02-24 ENCOUNTER — Encounter: Payer: Self-pay | Admitting: Endocrinology

## 2011-03-10 ENCOUNTER — Other Ambulatory Visit: Payer: Self-pay

## 2011-03-10 MED ORDER — OXYCODONE-ACETAMINOPHEN 10-325 MG PO TABS: 1.0000 | ORAL_TABLET | ORAL | Status: DC | PRN

## 2011-03-12 NOTE — Telephone Encounter (Signed)
Pt informed, Rx in cabinet for pt pick up  

## 2011-03-15 ENCOUNTER — Other Ambulatory Visit: Payer: Self-pay | Admitting: Endocrinology

## 2011-03-15 ENCOUNTER — Encounter (HOSPITAL_COMMUNITY)
Admission: RE | Admit: 2011-03-15 | Discharge: 2011-03-15 | Disposition: A | Discharge status: Home / Self Care | Payer: Medicare Other | Source: Ambulatory Visit | Attending: Endocrinology | Admitting: Endocrinology

## 2011-03-15 DIAGNOSIS — E119 Type 2 diabetes mellitus without complications: Secondary | ICD-10-CM | POA: Insufficient documentation

## 2011-03-15 DIAGNOSIS — K3189 Other diseases of stomach and duodenum: Secondary | ICD-10-CM | POA: Insufficient documentation

## 2011-03-15 DIAGNOSIS — R110 Nausea: Secondary | ICD-10-CM

## 2011-03-15 DIAGNOSIS — R6881 Early satiety: Secondary | ICD-10-CM | POA: Insufficient documentation

## 2011-03-15 DIAGNOSIS — R1013 Epigastric pain: Secondary | ICD-10-CM | POA: Insufficient documentation

## 2011-03-15 DIAGNOSIS — K3184 Gastroparesis: Secondary | ICD-10-CM

## 2011-03-15 DIAGNOSIS — R112 Nausea with vomiting, unspecified: Secondary | ICD-10-CM | POA: Insufficient documentation

## 2011-03-15 MED ORDER — TECHNETIUM TC 99M SULFUR COLLOID
2.2000 | Freq: Once | INTRAVENOUS | Status: AC | PRN
  Administered 2011-03-15: 2.2 via INTRAVENOUS

## 2011-03-18 ENCOUNTER — Ambulatory Visit: Payer: Medicare Other | Admitting: Internal Medicine

## 2011-03-22 ENCOUNTER — Encounter: Payer: Self-pay | Admitting: Internal Medicine

## 2011-03-24 ENCOUNTER — Ambulatory Visit: Payer: Medicare Other | Admitting: Endocrinology

## 2011-03-24 DIAGNOSIS — Z0289 Encounter for other administrative examinations: Secondary | ICD-10-CM

## 2011-03-26 ENCOUNTER — Encounter: Payer: Self-pay | Admitting: Endocrinology

## 2011-03-26 ENCOUNTER — Ambulatory Visit (INDEPENDENT_AMBULATORY_CARE_PROVIDER_SITE_OTHER): Payer: Medicare Other | Admitting: Endocrinology

## 2011-03-26 DIAGNOSIS — K219 Gastro-esophageal reflux disease without esophagitis: Secondary | ICD-10-CM

## 2011-03-26 DIAGNOSIS — R609 Edema, unspecified: Secondary | ICD-10-CM

## 2011-03-26 DIAGNOSIS — E119 Type 2 diabetes mellitus without complications: Secondary | ICD-10-CM

## 2011-03-26 MED ORDER — OMEPRAZOLE 40 MG PO CPDR: 40.0000 mg | DELAYED_RELEASE_CAPSULE | Freq: Every day | ORAL | Status: DC

## 2011-03-26 NOTE — Progress Notes (Signed)
Subjective:    Patient ID: [name], female    DOB: July 25, 1940, 70 y.o.   MRN: [id number]  HPI Pt returns for f/u of insulin-requiring DM (1997).  no cbg record, but states cbg's are 66-227.  It is in general higher as the day goes on.   Pt states 2 weeks of moderate swelling of the legs, and assoc doe.   She also has heartburn.  No dysphagia or brbpr.    Past Medical History  Diagnosis Date  . COLONIC POLYPS 09/13/2007  . DIABETES MELLITUS, TYPE I 10/29/2006  . HYPERCHOLESTEROLEMIA 02/25/2009  . HYPONATREMIA 04/22/2008  . HYPOKALEMIA 01/15/2008  . SMOKER 02/22/2008  . DEPRESSION 02/22/2008  . HYPERTENSION 12/20/2008  . 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 ALLERGIC RHINITIS 10/29/2006  . COPD 04/22/2009  . GERD 10/29/2006  . CIRRHOSIS 10/29/2006  . DEGENERATIVE JOINT DISEASE 06/15/2007  . KNEE PAIN, RIGHT 02/03/2010  . OSTEOPOROSIS 10/29/2006  . SEIZURE DISORDER 01/06/2010  . HYPERSOMNIA, ASSOCIATED WITH SLEEP APNEA 05/07/2009  . DYSPNEA 02/22/2008  . OTHER DYSPNEA AND RESPIRATORY ABNORMALITIES 04/23/2009  . CHEST PAIN 02/22/2008  . ASYMPTOMATIC POSTMENOPAUSAL STATUS 02/22/2008  . Gastroparesis   . Fatty liver   . Gastritis   . Diverticulosis   . Hyperplastic colon polyp   . OSA (obstructive sleep apnea)     Past Surgical History  Procedure Date  . Appendectomy   . Abdominal hysterectomy 1980's  . (r) leg broken     when she was 18  . Breast surgery 1986 or 1987    Breast reduction  . Catartact surgery     History   Social History  . Marital Status: Widowed    Spouse Name: N/A    Number of Children: N/A  . Years of Education: N/A   Occupational History  . Not on file.   Social History Main Topics  . Smoking status: Current Everyday Smoker  . Smokeless tobacco: Not on file  . Alcohol Use: No  . Drug Use: No  . Sexually Active: Not on file   Other Topics Concern  . Not on file   Social History Narrative  . No narrative on file     Current Outpatient Prescriptions on File Prior to Visit  Medication Sig Dispense Refill  . aspirin 81 MG tablet Take 81 mg by mouth daily.        . budesonide-formoterol (SYMBICORT) 160-4.5 MCG/ACT inhaler Inhale 2 puffs into the lungs 2 (two) times daily.        [name] buPROPion (WELLBUTRIN XL) 300 MG 24 hr tablet Take 1 tablet (300 mg total) by mouth daily.  30 tablet  11  . furosemide (LASIX) 20 MG tablet TAKE 1 TABLET BY MOUTH EVERY DAY  90 tablet  2  . glucose blood (TRUETRACK TEST) test strip 1 each by Other route as needed. Use as instructed       . insulin detemir (LEVEMIR) 100 UNIT/ML injection 500 units each morning, and 50 units in the evening      . Insulin Pen Needle (B-D ULTRAFINE III SHORT PEN) 31G X 8 MM MISC by Does not apply route as directed.        [name] losartan-hydrochlorothiazide (HYZAAR) 100-25 MG per tablet Take 1 tablet by mouth daily.        . metoCLOPramide (REGLAN) 10 MG tablet Take 10 mg by mouth every 6 (six) hours as needed.        [name] oxyCODONE-acetaminophen (PERCOCET) 10-325 MG  per tablet Take 1 tablet by mouth every 4 (four) hours as needed. For pain  120 tablet  0  . potassium chloride SA (K-DUR,KLOR-CON) 20 MEQ tablet TAKE 1 TABLET BY MOUTH EVERY DAY WITH FOOD  90 tablet  2  . simvastatin (ZOCOR) 40 MG tablet Take 40 mg by mouth at bedtime.        . triamcinolone (KENALOG) 0.1 % cream Apply topically 3 (three) times daily. Prn for itching  30 g  2  . zolpidem (AMBIEN) 5 MG tablet Take 5 mg by mouth at bedtime as needed.          Allergies  Allergen Reactions  . Lisinopril     REACTION: Cough  . Pioglitazone     REACTION: Edema  . Varenicline Tartrate     REACTION: bad dreams    Family History  Problem Relation Age of Onset  . Ovarian cancer Mother   . Diabetes Mother   . Diabetes Other   . Heart disease Mother   . Heart disease Father   . Heart disease Maternal Grandmother   . Heart disease Sister   . Colon cancer Neg Hx     BP 126/82  Pulse 83   Temp(Src) 99.2 F (37.3 C) (Oral)  Ht 5\' 7"  (1.702 m)  Wt 371 lb 9.6 oz (168.557 kg)  BMI 58.20 kg/m2  SpO2 94%  Review of Systems Denies loc and chest pain.      Objective:   Physical Exam VITAL SIGNS:  See vs page GENERAL: no distress ABDOMEN: abdomen is soft, nontender.  no hepatosplenomegaly.   not distended.  no hernia Ext: 1+ bilat leg edema   Lab Results  Component Value Date   HGBA1C 7.8* 01/29/2011      Assessment & Plan:  DM.   this is the best control this pt should aim for, given this regimen, which does match insulin to her changing needs throughout the day Edema, possible exac by Nancee Liter, recurrent

## 2011-03-26 NOTE — Patient Instructions (Addendum)
i have sent a prescription to your pharmacy, for heartburn. Stop amlodipine.  Please come back for a follow-up appointment for 1 month.  Please make an appointment.

## 2011-04-09 ENCOUNTER — Other Ambulatory Visit: Payer: Self-pay

## 2011-04-09 MED ORDER — OXYCODONE-ACETAMINOPHEN 10-325 MG PO TABS: 1.0000 | ORAL_TABLET | ORAL | Status: DC | PRN

## 2011-04-09 NOTE — Telephone Encounter (Signed)
Pt informed, Rx in cabinet for pt pick up  

## 2011-04-09 NOTE — Telephone Encounter (Signed)
Done hardcopy to dahlia/LIM B  

## 2011-04-09 NOTE — Telephone Encounter (Signed)
Please advise in Dr George Hugh absence, thanks!

## 2011-04-27 ENCOUNTER — Telehealth: Payer: Self-pay | Admitting: *Deleted

## 2011-04-27 NOTE — Telephone Encounter (Signed)
[facility] Department RN called requesting verbal orders for [facility] for pt for help with bath and personal care three times weekly

## 2011-04-28 NOTE — Telephone Encounter (Signed)
ok 

## 2011-04-28 NOTE — Telephone Encounter (Signed)
[facility] RN informed of verbal order.

## 2011-05-17 ENCOUNTER — Telehealth: Payer: Self-pay

## 2011-05-17 NOTE — Telephone Encounter (Signed)
[facility] Triage Call Report Triage Record Num: [id number] Operator: Tarri Glenn Patient Name: [name] Call Date & Time: 05/14/2011 3:27:35PM Patient Phone: [phone] PCP: Romero Belling Patient Gender: Female PCP Fax : [phone] Patient DOB: 05-Jul-1940 Practice Name: [facility] Reason for Call: Caller: [name]/Patient; PCP: Romero Belling; CB#: [phone]; Call Reason: Back Pain; Sx Onset: 05/13/2011; Sx Notes: ; Afebrile; Wt: ; Home treatment(s) tried: advil; Did home treatment help?: No; Guideline Used: ; Disp:; Appt Scheduled?: Patient is calling about had called office 05/11/11 for refill on pain medications and wants to know if the prescription can just be called in for the medication due to the weather. As seen in [facility], prescription was for Percocet, advised patient that Percocet is a hand written prescription only and office is closed now and 05/15/11 due to weather. Protocol(s) Used: Office Note Recommended Outcome per Protocol: Information Noted and Sent to Office Reason for Outcome: Caller information to office Care Advice: ~ 05/14/2011 3:41:24PM Page 1 of 1 [email or web address]

## 2011-05-24 ENCOUNTER — Other Ambulatory Visit: Payer: Self-pay | Admitting: Endocrinology

## 2011-05-24 ENCOUNTER — Telehealth: Payer: Self-pay | Admitting: Endocrinology

## 2011-05-24 MED ORDER — OXYCODONE-ACETAMINOPHEN 10-325 MG PO TABS: 1.0000 | ORAL_TABLET | ORAL | Status: DC | PRN

## 2011-05-24 NOTE — Telephone Encounter (Signed)
Son walks in, requesting percocet refill Refill given to him.  He is asked to tell pt ov is due

## 2011-05-25 ENCOUNTER — Other Ambulatory Visit: Payer: Self-pay

## 2011-05-25 MED ORDER — INSULIN PEN NEEDLE 31G X 8 MM MISC: 1.0000 | Status: DC

## 2011-06-15 ENCOUNTER — Ambulatory Visit: Payer: Medicare Other | Admitting: Endocrinology

## 2011-06-18 ENCOUNTER — Telehealth: Payer: Self-pay | Admitting: Endocrinology

## 2011-06-18 MED ORDER — OXYCODONE-ACETAMINOPHEN 10-325 MG PO TABS: 1.0000 | ORAL_TABLET | ORAL | Status: DC | PRN

## 2011-06-18 NOTE — Telephone Encounter (Signed)
Patient informed to pickup prescription at front desk. 

## 2011-06-18 NOTE — Telephone Encounter (Signed)
The pt called and stated she needed an rx for her pain med.  She stated she did not know the name of the med, but is hoping it can be picked up today.    Thanks!

## 2011-06-18 NOTE — Telephone Encounter (Signed)
Pt requesting refill of Percocet-last written 05/24/2011 #120 with 0 refills. SAE pt, please advise.

## 2011-06-18 NOTE — Telephone Encounter (Signed)
Done hardcopy to robin  

## 2011-06-28 ENCOUNTER — Other Ambulatory Visit (INDEPENDENT_AMBULATORY_CARE_PROVIDER_SITE_OTHER): Payer: Medicare Other

## 2011-06-28 ENCOUNTER — Encounter: Payer: Self-pay | Admitting: Endocrinology

## 2011-06-28 ENCOUNTER — Ambulatory Visit (INDEPENDENT_AMBULATORY_CARE_PROVIDER_SITE_OTHER): Payer: Medicare Other | Admitting: Endocrinology

## 2011-06-28 VITALS — BP 138/76 | HR 84 | Temp 97.8°F | Ht 67.0 in | Wt 377.0 lb

## 2011-06-28 DIAGNOSIS — E871 Hypo-osmolality and hyponatremia: Secondary | ICD-10-CM

## 2011-06-28 DIAGNOSIS — D126 Benign neoplasm of colon, unspecified: Secondary | ICD-10-CM

## 2011-06-28 DIAGNOSIS — I10 Essential (primary) hypertension: Secondary | ICD-10-CM

## 2011-06-28 DIAGNOSIS — E7800 Pure hypercholesterolemia, unspecified: Secondary | ICD-10-CM

## 2011-06-28 DIAGNOSIS — M810 Age-related osteoporosis without current pathological fracture: Secondary | ICD-10-CM

## 2011-06-28 DIAGNOSIS — E109 Type 1 diabetes mellitus without complications: Secondary | ICD-10-CM

## 2011-06-28 DIAGNOSIS — K219 Gastro-esophageal reflux disease without esophagitis: Secondary | ICD-10-CM

## 2011-06-28 DIAGNOSIS — K7460 Unspecified cirrhosis of liver: Secondary | ICD-10-CM

## 2011-06-28 DIAGNOSIS — R0989 Other specified symptoms and signs involving the circulatory and respiratory systems: Secondary | ICD-10-CM

## 2011-06-28 DIAGNOSIS — R0609 Other forms of dyspnea: Secondary | ICD-10-CM

## 2011-06-28 DIAGNOSIS — E876 Hypokalemia: Secondary | ICD-10-CM

## 2011-06-28 DIAGNOSIS — R609 Edema, unspecified: Secondary | ICD-10-CM

## 2011-06-28 DIAGNOSIS — E785 Hyperlipidemia, unspecified: Secondary | ICD-10-CM

## 2011-06-28 LAB — URINALYSIS, ROUTINE W REFLEX MICROSCOPIC
Bilirubin Urine: NEGATIVE
Hgb urine dipstick: NEGATIVE
Ketones, ur: NEGATIVE
Leukocytes, UA: NEGATIVE
Nitrite: POSITIVE
Urobilinogen, UA: 2 (ref 0.0–1.0)

## 2011-06-28 LAB — CBC WITH DIFFERENTIAL/PLATELET
Basophils Relative: 0.4 % (ref 0.0–3.0)
Eosinophils Absolute: 0.1 10*3/uL (ref 0.0–0.7)
HCT: 44 % (ref 36.0–46.0)
Hemoglobin: 14.6 g/dL (ref 12.0–15.0)
Lymphocytes Relative: 24.7 % (ref 12.0–46.0)
Lymphs Abs: 1.9 10*3/uL (ref 0.7–4.0)
MCHC: 33.2 g/dL (ref 30.0–36.0)
MCV: 97 fl (ref 78.0–100.0)
Monocytes Absolute: 0.7 10*3/uL (ref 0.1–1.0)
Neutro Abs: 5 10*3/uL (ref 1.4–7.7)
RBC: 4.53 Mil/uL (ref 3.87–5.11)
RDW: 13.2 % (ref 11.5–14.6)

## 2011-06-28 LAB — HEPATIC FUNCTION PANEL
ALT: 16 U/L (ref 0–35)
AST: 21 U/L (ref 0–37)
Total Bilirubin: 0.2 mg/dL — ABNORMAL LOW (ref 0.3–1.2)
Total Protein: 6.9 g/dL (ref 6.0–8.3)

## 2011-06-28 LAB — BASIC METABOLIC PANEL
CO2: 31 mEq/L (ref 19–32)
Calcium: 9.1 mg/dL (ref 8.4–10.5)
Creatinine, Ser: 0.8 mg/dL (ref 0.4–1.2)
GFR: 87.5 mL/min (ref >60.00)
Glucose, Bld: 147 mg/dL — ABNORMAL HIGH (ref 70–99)
Sodium: 138 mEq/L (ref 135–145)

## 2011-06-28 LAB — IBC PANEL: Iron: 54 ug/dL (ref 42–145)

## 2011-06-28 LAB — LIPID PANEL
HDL: 41.3 mg/dL (ref >39.00)
LDL Cholesterol: 93 mg/dL (ref 0–99)
Total CHOL/HDL Ratio: 4
Triglycerides: 148 mg/dL (ref 0.0–149.0)

## 2011-06-28 LAB — MICROALBUMIN / CREATININE URINE RATIO
Creatinine,U: 347 mg/dL
Microalb, Ur: 3.6 mg/dL — ABNORMAL HIGH (ref 0.0–1.9)

## 2011-06-28 NOTE — Progress Notes (Signed)
Subjective:    Patient ID: [name], female    DOB: 11-Sep-1940, 71 y.o.   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 no cbg record, but states cbg's are 120-300. It is in general higher as the day goes on.  Pt states few years of moderate swelling of the legs, and assoc doe.   Past Medical History  Diagnosis Date  . COLONIC POLYPS 09/13/2007  . DIABETES MELLITUS, TYPE I 10/29/2006  . HYPERCHOLESTEROLEMIA 02/25/2009  . HYPONATREMIA 04/22/2008  . HYPOKALEMIA 01/15/2008  . SMOKER 02/22/2008  . DEPRESSION 02/22/2008  . HYPERTENSION 12/20/2008  . 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 ALLERGIC RHINITIS 10/29/2006  . COPD 04/22/2009  . GERD 10/29/2006  . CIRRHOSIS 10/29/2006  . DEGENERATIVE JOINT DISEASE 06/15/2007  . KNEE PAIN, RIGHT 02/03/2010  . OSTEOPOROSIS 10/29/2006  . SEIZURE DISORDER 01/06/2010  . HYPERSOMNIA, ASSOCIATED WITH SLEEP APNEA 05/07/2009  . DYSPNEA 02/22/2008  . OTHER DYSPNEA AND RESPIRATORY ABNORMALITIES 04/23/2009  . CHEST PAIN 02/22/2008  . ASYMPTOMATIC POSTMENOPAUSAL STATUS 02/22/2008  . Gastroparesis   . Fatty liver   . Gastritis   . Diverticulosis   . Hyperplastic colon polyp   . OSA (obstructive sleep apnea)     Past Surgical History  Procedure Date  . Appendectomy   . Abdominal hysterectomy 1980's  . (r) leg broken     when she was 18  . Breast surgery 1986 or 1987    Breast reduction  . Catartact surgery     History   Social History  . Marital Status: Widowed    Spouse Name: N/A    Number of Children: N/A  . Years of Education: N/A   Occupational History  . Not on file.   Social History Main Topics  . Smoking status: Current Everyday Smoker  . Smokeless tobacco: Not on file  . Alcohol Use: No  . Drug Use: No  . Sexually Active: Not on file   Other Topics Concern  . Not on file   Social History Narrative  . No narrative on file    Current  Outpatient Prescriptions on File Prior to Visit  Medication Sig Dispense Refill  . aspirin 81 MG tablet Take 81 mg by mouth daily.        . budesonide-formoterol (SYMBICORT) 160-4.5 MCG/ACT inhaler Inhale 2 puffs into the lungs 2 (two) times daily.        [name] buPROPion (WELLBUTRIN XL) 300 MG 24 hr tablet Take 1 tablet (300 mg total) by mouth daily.  30 tablet  11  . furosemide (LASIX) 20 MG tablet TAKE 1 TABLET BY MOUTH EVERY DAY  90 tablet  2  . glucose blood (TRUETRACK TEST) test strip 1 each by Other route as needed. Use as instructed       . insulin detemir (LEVEMIR) 100 UNIT/ML injection 525 units each morning, and 50 units in the evening      . Insulin Pen Needle (B-D ULTRAFINE III SHORT PEN) 31G X 8 MM MISC 1 each by Does not apply route as directed.  100 each  2  . losartan-hydrochlorothiazide (HYZAAR) 100-25 MG per tablet Take 1 tablet by mouth daily.        . metoCLOPramide (REGLAN) 10 MG tablet Take 10 mg by mouth every 6 (six) hours as needed.        [name] omeprazole (PRILOSEC) 40 MG capsule Take 1 capsule (40 mg total) by mouth  daily.  30 capsule  11  . oxyCODONE-acetaminophen (PERCOCET) 10-325 MG per tablet Take 1 tablet by mouth every 4 (four) hours as needed. With maximum 4 tabs per day, For pain  120 tablet  0  . potassium chloride SA (K-DUR,KLOR-CON) 20 MEQ tablet TAKE 1 TABLET BY MOUTH EVERY DAY WITH FOOD  90 tablet  2  . simvastatin (ZOCOR) 40 MG tablet Take 40 mg by mouth at bedtime.        . triamcinolone (KENALOG) 0.1 % cream Apply topically 3 (three) times daily. Prn for itching  30 g  2  . zolpidem (AMBIEN) 5 MG tablet Take 5 mg by mouth at bedtime as needed.          Allergies  Allergen Reactions  . Lisinopril     REACTION: Cough  . Pioglitazone     REACTION: Edema  . Varenicline Tartrate     REACTION: bad dreams    Family History  Problem Relation Age of Onset  . Ovarian cancer Mother   . Diabetes Mother   . Diabetes Other   . Heart disease Mother   . Heart  disease Father   . Heart disease Maternal Grandmother   . Heart disease Sister   . Colon cancer Neg Hx     BP 138/76  Pulse 84  Temp(Src) 97.8 F (36.6 C) (Oral)  Ht 5\' 7"  (1.702 m)  Wt 377 lb (171.006 kg)  BMI 59.05 kg/m2  SpO2 94%     Review of Systems denies hypoglycemia and weight change.      Objective:   Physical Exam VITAL SIGNS:  See vs page GENERAL: no distress Pulses: dorsalis pedis intact bilat.   Feet: no deformity.  no ulcer on the feet.  feet are of normal color and temp.  1+ bilat leg edema Neuro: sensation is intact to touch on the feet    Lab Results  Component Value Date   WBC 7.6 06/28/2011   HGB 14.6 06/28/2011   HCT 44.0 06/28/2011   PLT 221.0 06/28/2011   GLUCOSE 147* 06/28/2011   CHOL 164 06/28/2011   TRIG 148.0 06/28/2011   HDL 41.30 06/28/2011   LDLDIRECT 94.1 06/23/2010   LDLCALC 93 06/28/2011   ALT 16 06/28/2011   AST 21 06/28/2011   NA 138 06/28/2011   K 3.8 06/28/2011   CL 101 06/28/2011   CREATININE 0.8 06/28/2011   BUN 10 06/28/2011   CO2 31 06/28/2011   TSH 0.442 01/29/2011   INR 0.96 01/29/2011   HGBA1C 10.1* 06/28/2011   MICROALBUR 3.6* 06/28/2011      Assessment & Plan:  DM, needs increased rx Dyslipidemia, well-controlled Edema, unchanged

## 2011-06-28 NOTE — Patient Instructions (Addendum)
Increase levemir to 525 units each morning, and 50 each evening. Please make a "medicare wellness" appointment.   blood tests are being requested for you today.  please call [phone] to hear your test results.  You will be prompted to enter the 9-digit "MRN" number that appears at the top left of this page, followed by #.  Then you will hear the message. you will receive a phone call, about a day and time for an appointment, to go back to see dr Juanda Chance. (update: i left message on phone-tree:  Increase am levemir to [phone]).

## 2011-06-29 ENCOUNTER — Encounter: Payer: Self-pay | Admitting: Internal Medicine

## 2011-07-23 ENCOUNTER — Other Ambulatory Visit: Payer: Self-pay

## 2011-07-23 ENCOUNTER — Encounter: Payer: Self-pay | Admitting: *Deleted

## 2011-07-23 MED ORDER — OXYCODONE-ACETAMINOPHEN 10-325 MG PO TABS: 1.0000 | ORAL_TABLET | ORAL | Status: DC | PRN

## 2011-07-23 NOTE — Telephone Encounter (Signed)
Done hardcopy to robin  

## 2011-07-23 NOTE — Telephone Encounter (Signed)
Called informed thel patient to pickup prescription at front desk.

## 2011-07-28 ENCOUNTER — Encounter: Payer: Self-pay | Admitting: Internal Medicine

## 2011-08-03 ENCOUNTER — Ambulatory Visit (INDEPENDENT_AMBULATORY_CARE_PROVIDER_SITE_OTHER): Payer: Medicare Other | Admitting: Internal Medicine

## 2011-08-03 ENCOUNTER — Encounter: Payer: Self-pay | Admitting: Internal Medicine

## 2011-08-03 VITALS — BP 136/72 | HR 88 | Ht 67.0 in | Wt 382.0 lb

## 2011-08-03 DIAGNOSIS — K3184 Gastroparesis: Secondary | ICD-10-CM

## 2011-08-03 DIAGNOSIS — R112 Nausea with vomiting, unspecified: Secondary | ICD-10-CM

## 2011-08-03 DIAGNOSIS — Z86010 Personal history of colonic polyps: Secondary | ICD-10-CM

## 2011-08-03 MED ORDER — ALIGN 4 MG PO CAPS: 1.0000 | ORAL_CAPSULE | Freq: Every day | ORAL | Status: DC

## 2011-08-03 MED ORDER — PROMETHAZINE HCL 12.5 MG PO TABS: ORAL_TABLET | ORAL | Status: DC

## 2011-08-03 MED ORDER — PEG-KCL-NACL-NASULF-NA ASC-C 100 G PO SOLR: 1.0000 | Freq: Once | ORAL | Status: DC

## 2011-08-03 NOTE — Progress Notes (Signed)
Addended by: Richardson Chiquito on: 08/03/2011 11:42 AM   Modules accepted: Orders

## 2011-08-03 NOTE — Patient Instructions (Addendum)
You have been scheduled for an endoscopy and colonoscopy with propofol. Please follow the written instructions given to you at your visit today. Please pick up your prep at the pharmacy within the next 1-3 days. We have sent the following medications to your pharmacy for you to pick up at your convenience: Phenergan 12.5 mg-Take 1 tablet by mouth every 4-6 hours as needed for nausa We have given you samples of Align. This puts good bacteria back into your colon. You should take 1 capsule by mouth once daily. If this works well for you, it can be purchased over the counter. CC: Dr Everardo All

## 2011-08-03 NOTE — Progress Notes (Signed)
[place] March 08, 1941 MRN [id number]   History of Present Illness:  This is a 71 year old African American female with morbid obesity and insulin-dependent diabetes. Her periodic nausea and vomiting occurs immediately postprandially. She comes with her son who reports that the patient vomits usually at the end of the day. She denies abdominal pain but complains of a knot moving inside her abdomen from the right to the left side. She is having regular bowel habits. She has known gastroparesis documented on a gastric emptying scan in November 2012 which showed 90% of the food retained in the stomach in 60 minutes and 70% in 120 minutes. She is also on oxycodone and Wellbutrin. She had an upper endoscopy in June 2006 which showed mild gastritis. She had a colonoscopy in April 2004 which showed a hyperplastic polyp. She denies rectal bleeding or family history of colon cancer. Despite vomiting, patient's weight has been increasing. She now weighs 382 pounds.   Past Medical History  Diagnosis Date  . COLONIC POLYPS 09/13/2007  . DIABETES MELLITUS, TYPE I 10/29/2006  . HYPERCHOLESTEROLEMIA 02/25/2009  . HYPONATREMIA 04/22/2008  . HYPOKALEMIA 01/15/2008  . SMOKER 02/22/2008  . DEPRESSION 02/22/2008  . HYPERTENSION 12/20/2008  . 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 ALLERGIC RHINITIS 10/29/2006  . COPD 04/22/2009  . GERD 10/29/2006  . CIRRHOSIS 10/29/2006  . DEGENERATIVE JOINT DISEASE 06/15/2007  . KNEE PAIN, RIGHT 02/03/2010  . OSTEOPOROSIS 10/29/2006  . SEIZURE DISORDER 01/06/2010  . HYPERSOMNIA, ASSOCIATED WITH SLEEP APNEA 05/07/2009  . DYSPNEA 02/22/2008  . OTHER DYSPNEA AND RESPIRATORY ABNORMALITIES 04/23/2009  . CHEST PAIN 02/22/2008  . ASYMPTOMATIC POSTMENOPAUSAL STATUS 02/22/2008  . Gastroparesis   . Fatty liver   . Gastritis   . Diverticulosis   . Hyperplastic colon polyp   . OSA (obstructive sleep apnea)   . Cholelithiasis    Past Surgical History  Procedure Date   . Appendectomy   . Abdominal hysterectomy 1980's  . (r) leg broken     when she was 18  . Breast surgery 1986 or 1987    Breast reduction  . Catartact surgery     reports that she has quit smoking. She has never used smokeless tobacco. She reports that she does not drink alcohol or use illicit drugs. family history includes Clotting disorder in her sister; Colon cancer in her mother; Diabetes in her mother and other; Heart disease in her father, maternal grandmother, mother, and sister; and Ovarian cancer in her mother. Allergies  Allergen Reactions  . Lisinopril     REACTION: Cough  . Pioglitazone     REACTION: Edema  . Varenicline Tartrate     REACTION: bad dreams        Review of Systems: Massively overweight. Unable to sit up on the examining table  The remainder of the 10 point ROS is negative except as outlined in H&P   Physical Exam: General appearance  Well developed, in no distress. Eyes- non icteric. HEENT nontraumatic, normocephalic. Mouth no lesions, tongue papillated, no cheilosis. Neck supple without adenopathy, thyroid not enlarged, no carotid bruits, . Lungs Clear to auscultation bilaterally. Cor normal S1, normal S2, regular rhythm, no murmur,  quiet precordium. Abdomen: Massively obese with a large panniculus ,area with hyperpigmented skin over the lower abdomen with excess folds. Difficult exam. Unable to feel any mass other than fatty tissue. Bowel sounds are active. Rectal: I was unable to perform rectal exam due to her size  Extremities bilateral pedal edema with stasis  dermatitis. Skin no lesions. Neurological alert and oriented x 3. Psychological normal mood and affect.  Assessment and Plan:  Problem #1 Morbid obesity. Patient has diabetes and is on large doses of insulin. She has documented gastroparesis not responsive to Reglan. Despite vomiting, patient's weight continues to increase. Her son and the patient have been instructed on a low-fat  gastroparesis diet consisting of small meals at frequent intervals to prevent vomiting. We will proceed with an upper endoscopy with propofol to rule out gastric outlet obstruction. She will continue on Prilosec 40 mg daily and Reglan 10 mg at bedtime.  Problem #2 History of hyperplastic colon polyps 9 years ago. We will proceed with a colonoscopy with propofol for followup of colon polyps and because of abdominal symptoms as well as the fact that I was unable to do  rectal exam . She was given samples of a probiotic to take daily and was given Phenergan 12.5 mg as needed for nausea.   08/03/2011 Lina Sar

## 2011-08-06 ENCOUNTER — Ambulatory Visit: Payer: Medicare Other | Admitting: Endocrinology

## 2011-08-17 ENCOUNTER — Encounter (HOSPITAL_COMMUNITY)
Admission: RE | Disposition: A | Discharge status: Home / Self Care | Payer: Self-pay | Source: Ambulatory Visit | Attending: Internal Medicine

## 2011-08-17 ENCOUNTER — Ambulatory Visit (HOSPITAL_COMMUNITY)
Admission: RE | Admit: 2011-08-17 | Discharge: 2011-08-17 | Disposition: A | Discharge status: Home / Self Care | Payer: Medicare Other | Source: Ambulatory Visit | Attending: Internal Medicine | Admitting: Internal Medicine

## 2011-08-17 ENCOUNTER — Encounter (HOSPITAL_COMMUNITY): Payer: Self-pay | Admitting: *Deleted

## 2011-08-17 ENCOUNTER — Ambulatory Visit (HOSPITAL_COMMUNITY): Payer: Medicare Other | Admitting: Anesthesiology

## 2011-08-17 ENCOUNTER — Encounter (HOSPITAL_COMMUNITY): Payer: Self-pay | Admitting: Anesthesiology

## 2011-08-17 DIAGNOSIS — R112 Nausea with vomiting, unspecified: Secondary | ICD-10-CM | POA: Insufficient documentation

## 2011-08-17 DIAGNOSIS — J449 Chronic obstructive pulmonary disease, unspecified: Secondary | ICD-10-CM | POA: Insufficient documentation

## 2011-08-17 DIAGNOSIS — K209 Esophagitis, unspecified without bleeding: Secondary | ICD-10-CM | POA: Insufficient documentation

## 2011-08-17 DIAGNOSIS — R079 Chest pain, unspecified: Secondary | ICD-10-CM

## 2011-08-17 DIAGNOSIS — M810 Age-related osteoporosis without current pathological fracture: Secondary | ICD-10-CM | POA: Insufficient documentation

## 2011-08-17 DIAGNOSIS — E7800 Pure hypercholesterolemia, unspecified: Secondary | ICD-10-CM | POA: Insufficient documentation

## 2011-08-17 DIAGNOSIS — Z860100 Personal history of colon polyps, unspecified: Secondary | ICD-10-CM

## 2011-08-17 DIAGNOSIS — I69959 Hemiplegia and hemiparesis following unspecified cerebrovascular disease affecting unspecified side: Secondary | ICD-10-CM | POA: Insufficient documentation

## 2011-08-17 DIAGNOSIS — G4733 Obstructive sleep apnea (adult) (pediatric): Secondary | ICD-10-CM | POA: Insufficient documentation

## 2011-08-17 DIAGNOSIS — E109 Type 1 diabetes mellitus without complications: Secondary | ICD-10-CM | POA: Insufficient documentation

## 2011-08-17 DIAGNOSIS — K3184 Gastroparesis: Secondary | ICD-10-CM

## 2011-08-17 DIAGNOSIS — J4489 Other specified chronic obstructive pulmonary disease: Secondary | ICD-10-CM | POA: Insufficient documentation

## 2011-08-17 DIAGNOSIS — K219 Gastro-esophageal reflux disease without esophagitis: Secondary | ICD-10-CM

## 2011-08-17 DIAGNOSIS — D126 Benign neoplasm of colon, unspecified: Secondary | ICD-10-CM

## 2011-08-17 HISTORY — PX: ESOPHAGOGASTRODUODENOSCOPY: SHX5428

## 2011-08-17 HISTORY — PX: COLONOSCOPY: SHX5424

## 2011-08-17 HISTORY — DX: Cerebral infarction, unspecified: I63.9

## 2011-08-17 HISTORY — DX: Headache: R51

## 2011-08-17 HISTORY — DX: Heart failure, unspecified: I50.9

## 2011-08-17 SURGERY — EGD (ESOPHAGOGASTRODUODENOSCOPY)
Anesthesia: Monitor Anesthesia Care

## 2011-08-17 MED ORDER — LACTATED RINGERS IV SOLN
INTRAVENOUS | Status: DC
  Administered 2011-08-17: 1000 mL via INTRAVENOUS

## 2011-08-17 MED ORDER — INSULIN PEN NEEDLE 31G X 8 MM MISC: 1.0000 | Status: DC

## 2011-08-17 MED ORDER — FENTANYL CITRATE 0.05 MG/ML IJ SOLN
INTRAMUSCULAR | Status: DC | PRN
  Administered 2011-08-17: 100 ug via INTRAVENOUS

## 2011-08-17 MED ORDER — ALIGN 4 MG PO CAPS: 1.0000 | ORAL_CAPSULE | Freq: Every day | ORAL | Status: DC

## 2011-08-17 MED ORDER — MIDAZOLAM HCL 5 MG/5ML IJ SOLN
INTRAMUSCULAR | Status: DC | PRN
  Administered 2011-08-17: 2 mg via INTRAVENOUS

## 2011-08-17 MED ORDER — PROPOFOL 10 MG/ML IV EMUL
INTRAVENOUS | Status: DC | PRN
  Administered 2011-08-17: 50 ug/kg/min via INTRAVENOUS

## 2011-08-17 NOTE — Anesthesia Postprocedure Evaluation (Signed)
  Anesthesia Post-op Note  Patient: [name]  Procedure(s) Performed: Procedure(s) (LRB): ESOPHAGOGASTRODUODENOSCOPY (EGD) (N/A) COLONOSCOPY (N/A)  Patient Location: PACU  Anesthesia Type: MAC  Level of Consciousness: awake and alert   Airway and Oxygen Therapy: Patient Spontanous Breathing  Post-op Pain: mild  Post-op Assessment: Post-op Vital signs reviewed, Patient's Cardiovascular Status Stable, Respiratory Function Stable, Patent Airway and No signs of Nausea or vomiting  Post-op Vital Signs: stable  Complications: No apparent anesthesia complications. No complaints. Did well with MAC

## 2011-08-17 NOTE — Transfer of Care (Signed)
Immediate Anesthesia Transfer of Care Note  Patient: [name]  Procedure(s) Performed: Procedure(s) (LRB): ESOPHAGOGASTRODUODENOSCOPY (EGD) (N/A) COLONOSCOPY (N/A)  Patient Location: PACU  Anesthesia Type: MAC  Level of Consciousness: sedated  Airway & Oxygen Therapy: Patient Spontanous Breathing and Patient connected to nasal cannula oxygen  Post-op Assessment: Report given to PACU RN and Post -op Vital signs reviewed and stable  Post vital signs: Reviewed and stable  Complications: No apparent anesthesia complications

## 2011-08-17 NOTE — H&P (View-Only) (Signed)
[name] 12/17/1940 MRN [id number]   History of Present Illness:  This is a 71-year-old African American female with morbid obesity and insulin-dependent diabetes. Her periodic nausea and vomiting occurs immediately postprandially. She comes with her son who reports that the patient vomits usually at the end of the day. She denies abdominal pain but complains of a knot moving inside her abdomen from the right to the left side. She is having regular bowel habits. She has known gastroparesis documented on a gastric emptying scan in November 2012 which showed 90% of the food retained in the stomach in 60 minutes and 70% in 120 minutes. She is also on oxycodone and Wellbutrin. She had an upper endoscopy in June 2006 which showed mild gastritis. She had a colonoscopy in April 2004 which showed a hyperplastic polyp. She denies rectal bleeding or family history of colon cancer. Despite vomiting, patient's weight has been increasing. She now weighs 382 pounds.   Past Medical History  Diagnosis Date  . COLONIC POLYPS 09/13/2007  . DIABETES MELLITUS, TYPE I 10/29/2006  . HYPERCHOLESTEROLEMIA 02/25/2009  . HYPONATREMIA 04/22/2008  . HYPOKALEMIA 01/15/2008  . SMOKER 02/22/2008  . DEPRESSION 02/22/2008  . HYPERTENSION 12/20/2008  . 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 ALLERGIC RHINITIS 10/29/2006  . COPD 04/22/2009  . GERD 10/29/2006  . CIRRHOSIS 10/29/2006  . DEGENERATIVE JOINT DISEASE 06/15/2007  . KNEE PAIN, RIGHT 02/03/2010  . OSTEOPOROSIS 10/29/2006  . SEIZURE DISORDER 01/06/2010  . HYPERSOMNIA, ASSOCIATED WITH SLEEP APNEA 05/07/2009  . DYSPNEA 02/22/2008  . OTHER DYSPNEA AND RESPIRATORY ABNORMALITIES 04/23/2009  . CHEST PAIN 02/22/2008  . ASYMPTOMATIC POSTMENOPAUSAL STATUS 02/22/2008  . Gastroparesis   . Fatty liver   . Gastritis   . Diverticulosis   . Hyperplastic colon polyp   . OSA (obstructive sleep apnea)   . Cholelithiasis    Past Surgical History  Procedure Date   . Appendectomy   . Abdominal hysterectomy 1980's  . (r) leg broken     when she was 18  . Breast surgery 1986 or 1987    Breast reduction  . Catartact surgery     reports that she has quit smoking. She has never used smokeless tobacco. She reports that she does not drink alcohol or use illicit drugs. family history includes Clotting disorder in her sister; Colon cancer in her mother; Diabetes in her mother and other; Heart disease in her father, maternal grandmother, mother, and sister; and Ovarian cancer in her mother. Allergies  Allergen Reactions  . Lisinopril     REACTION: Cough  . Pioglitazone     REACTION: Edema  . Varenicline Tartrate     REACTION: bad dreams        Review of Systems: Massively overweight. Unable to sit up on the examining table  The remainder of the 10 point ROS is negative except as outlined in H&P   Physical Exam: General appearance  Well developed, in no distress. Eyes- non icteric. HEENT nontraumatic, normocephalic. Mouth no lesions, tongue papillated, no cheilosis. Neck supple without adenopathy, thyroid not enlarged, no carotid bruits, . Lungs Clear to auscultation bilaterally. Cor normal S1, normal S2, regular rhythm, no murmur,  quiet precordium. Abdomen: Massively obese with a large panniculus ,area with hyperpigmented skin over the lower abdomen with excess folds. Difficult exam. Unable to feel any mass other than fatty tissue. Bowel sounds are active. Rectal: I was unable to perform rectal exam due to her size  Extremities bilateral pedal edema with stasis   dermatitis. Skin no lesions. Neurological alert and oriented x 3. Psychological normal mood and affect.  Assessment and Plan:  Problem #1 Morbid obesity. Patient has diabetes and is on large doses of insulin. She has documented gastroparesis not responsive to Reglan. Despite vomiting, patient's weight continues to increase. Her son and the patient have been instructed on a low-fat  gastroparesis diet consisting of small meals at frequent intervals to prevent vomiting. We will proceed with an upper endoscopy with propofol to rule out gastric outlet obstruction. She will continue on Prilosec 40 mg daily and Reglan 10 mg at bedtime.  Problem #2 History of hyperplastic colon polyps 9 years ago. We will proceed with a colonoscopy with propofol for followup of colon polyps and because of abdominal symptoms as well as the fact that I was unable to do  rectal exam . She was given samples of a probiotic to take daily and was given Phenergan 12.5 mg as needed for nausea.   08/03/2011 Alisi Lupien  

## 2011-08-17 NOTE — Interval H&P Note (Signed)
History and Physical Interval Note:  08/17/2011 9:38 AM  [name]  has presented today for surgery, with the diagnosis of gerd  The various methods of treatment have been discussed with the patient and family. After consideration of risks, benefits and other options for treatment, the patient has consented to  Procedure(s) (LRB): ESOPHAGOGASTRODUODENOSCOPY (EGD) (N/A) COLONOSCOPY (N/A) as a surgical intervention .  The patients' history has been reviewed, patient examined, no change in status, stable for surgery.  I have reviewed the patients' chart and labs.  Questions were answered to the patient's satisfaction.     [name]

## 2011-08-17 NOTE — Discharge Instructions (Signed)
Take Phenergan every 4-6 hours as needed for nausea, follow up Dr Juanda Chance 4-6 weeks  Colonoscopy Care After Read the instructions outlined below and refer to this sheet in the next few weeks. These discharge instructions provide you with general information on caring for yourself after you leave the hospital. Your doctor may also give you specific instructions. While your treatment has been planned according to the most current medical practices available, unavoidable complications occasionally occur. If you have any problems or questions after discharge, call your doctor. HOME CARE INSTRUCTIONS ACTIVITY:  You may resume your regular activity, but move at a slower pace for the next 24 hours.   Take frequent rest periods for the next 24 hours.   Walking will help get rid of the air and reduce the bloated feeling in your belly (abdomen).   No driving for 24 hours (because of the medicine (anesthesia) used during the test).   You may shower.   Do not sign any important legal documents or operate any machinery for 24 hours (because of the anesthesia used during the test).  NUTRITION:  Drink plenty of fluids.   You may resume your normal diet as instructed by your doctor.   Begin with a light meal and progress to your normal diet. Heavy or fried foods are harder to digest and may make you feel sick to your stomach (nauseated).   Avoid alcoholic beverages for 24 hours or as instructed.  MEDICATIONS:  You may resume your normal medications unless your doctor tells you otherwise.  WHAT TO EXPECT TODAY:  Some feelings of bloating in the abdomen.   Passage of more gas than usual.   Spotting of blood in your stool or on the toilet paper.  IF YOU HAD POLYPS REMOVED DURING THE COLONOSCOPY:  No aspirin products for 7 days or as instructed.   No alcohol for 7 days or as instructed.   Eat a soft diet for the next 24 hours.  FINDING OUT THE RESULTS OF YOUR TEST Not all test results are  available during your visit. If your test results are not back during the visit, make an appointment with your caregiver to find out the results. Do not assume everything is normal if you have not heard from your caregiver or the medical facility. It is important for you to follow up on all of your test results.  SEEK IMMEDIATE MEDICAL CARE IF:  You have more than a spotting of blood in your stool.   Your belly is swollen (abdominal distention).   You are nauseated or vomiting.   You have a fever.   You have abdominal pain or discomfort that is severe or gets worse throughout the day.   Moderate Sedation, Adult Moderate sedation is given to help you relax or even sleep through a procedure. You may remain sleepy, be clumsy, or have poor balance for several hours following this procedure. Arrange for a responsible adult, family member, or friend to take you home. A responsible adult should stay with you for at least 24 hours or until the medicines have worn off.  Do not participate in any activities where you could become injured for the next 24 hours, or until you feel normal again. Do not:   Drive.   Swim.   Ride a bicycle.   Operate heavy machinery.   Cook.   Use power tools.   Climb ladders.   Work at [employer].   Do not make important decisions or sign legal documents  until you are improved.   Vomiting may occur if you eat too soon. When you can drink without vomiting, try water, juice, or soup. Try solid foods if you feel little or no nausea.   Only take over-the-counter or prescription medications for pain, discomfort, or fever as directed by your caregiver.If pain medications have been prescribed for you, ask your caregiver how soon it is safe to take them.   Make sure you and your family fully understands everything about the medication given to you. Make sure you understand what side effects may occur.   You should not drink alcohol, take sleeping pills, or medications  that cause drowsiness for at least 24 hours.   If you smoke, do not smoke alone.   If you are feeling better, you may resume normal activities 24 hours after receiving sedation.   Keep all appointments as scheduled. Follow all instructions.   Ask questions if you do not understand.  SEEK MEDICAL CARE IF:   Your skin is pale or bluish in color.   You continue to feel sick to your stomach (nauseous) or throw up (vomit).   Your pain is getting worse and not helped by medication.   You have bleeding or swelling.   You are still sleepy or feeling clumsy after 24 hours.  SEEK IMMEDIATE MEDICAL CARE IF:   You develop a rash.   You have difficulty breathing.   You develop any type of allergic problem.   You have a fever.  Document Released: 12/29/2000 Document Revised: 03/25/2011 Document Reviewed: 05/22/2007 [facility] Patient Information 2012 [place], [place].

## 2011-08-17 NOTE — Anesthesia Preprocedure Evaluation (Addendum)
Anesthesia Evaluation  Patient identified by MRN, date of birth, ID band Patient awake  General Assessment Comment:BMI 60  Reviewed: Allergy & Precautions, H&P , NPO status , Patient's Chart, lab work & pertinent test results  Airway Mallampati: III TM Distance: >3 FB Neck ROM: Full    Dental No notable dental hx.    Pulmonary shortness of breath, sleep apnea , COPD CXR: 01/29/11: reviewed. Probable bronchitis. No pneumonia. breath sounds clear to auscultation  Pulmonary exam normal       Cardiovascular hypertension, Pt. on medications Rhythm:Regular Rate:Normal  ECG: SR 78, low voltage, borderline r wave progression. Note from Dr. Shirlee Latch (05/21/10) reviewed. Lexican myoview 11/10 showed EF 51%, slight reversible anterior and septal defect of borderline significance. Left heart cath showed no coronary disease. Echo showed mild diastolic dysfunction. Had chest pain 01/29/11 and ruled out per patient.   Neuro/Psych Seizures -,  PSYCHIATRIC DISORDERS Depression Stroke with right sided weakness. 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   Endo/Other  negative endocrine ROSDiabetes mellitus-, Type 1, Insulin DependentMorbid obesity  Renal/GU negative Renal ROS  negative genitourinary   Musculoskeletal negative musculoskeletal ROS (+)   Abdominal (+) + obese,   Peds negative pediatric ROS (+)  Hematology negative hematology ROS (+)   Anesthesia Other Findings   Reproductive/Obstetrics negative OB ROS                          Anesthesia Physical Anesthesia Plan  ASA: IV  Anesthesia Plan: MAC   Post-op Pain Management:    Induction:   Airway Management Planned:   Additional Equipment:   Intra-op Plan:   Post-operative Plan:   Informed Consent:   Plan Discussed with:   Anesthesia Plan Comments: (ASA 4 patient  with morbid obesity, smoking, diastolic heart dysfunction, diabetes, htn, cirrhosis. Plan careful MAC.)       Anesthesia Quick Evaluation

## 2011-08-17 NOTE — Op Note (Signed)
[facility] [address] [place], [place]  [address]  COLONOSCOPY PROCEDURE REPORT  PATIENT:  [name], [name]  MR#:  [id number] BIRTHDATE:  1940-10-27, 70 yrs. old  GENDER:  female ENDOSCOPIST:  Hedwig Morton. Juanda Chance, MD REF. BY:  Sean A. Everardo All, M.D. PROCEDURE DATE:  08/17/2011 PROCEDURE:  Colonoscopy with biopsy ASA CLASS:  Class III INDICATIONS:  history of hyperplastic polyps hyperplastic polyp,in 2004 MEDICATIONS:   MAC sedation, administered by CRNA  DESCRIPTION OF PROCEDURE:   After the risks and benefits and of the procedure were explained, informed consent was obtained. Digital rectal exam was performed and revealed no rectal masses. The EC-[id number] ([id number]) endoscope was introduced through the anus and advanced to the cecum, which was identified by the ileocecal valve.  The quality of the prep was Moviprep fair.  The instrument was then slowly withdrawn as the colon was fully examined. <<PROCEDUREIMAGES>>  FINDINGS:  Two polyps were found in the ascending colon. two 3-4 mm sessile polyps at 120cm The polyps were removed using cold biopsy forceps (see image002 and image001).  This was otherwise a normal examination of the colon (see image006, image005, and image004).   Retroflexed views in the rectum revealed no abnormalities.    The scope was then withdrawn from the patient and the procedure completed.  COMPLICATIONS:  None ENDOSCOPIC IMPRESSION: 1) Two polyps in the ascending colon 2) Otherwise normal examination s/p random biopsies of the left colon,s/p polypectomyx2 RECOMMENDATIONS: 1) Await pathology results gastroparesis diet  REPEAT EXAM:  In 10 year(s) for.  ______________________________ Hedwig Morton. Juanda Chance, MD  CC:  n. eSIGNED:   Hedwig Morton. Jourdin Connors at 08/17/2011 10:33 AM  [place], [place], [phone]

## 2011-08-17 NOTE — Op Note (Signed)
[facility] [address] [place], [place]  [address]  ENDOSCOPY PROCEDURE REPORT  PATIENT:  Kylie, [name]  MR#:  [id number] BIRTHDATE:  25-Feb-1941, 70 yrs. old  GENDER:  female  ENDOSCOPIST:  Hedwig Morton. Juanda Chance, MD Referred by:  Windle Guard, M.D. Sean A. Everardo All, M.D.  PROCEDURE DATE:  08/17/2011 PROCEDURE:  EGD with biopsy, [address] ASA CLASS:  Class III INDICATIONS:  nausea and vomiting known gastroparesis- 70% retention in 2 hours, last EGD 2006 showed CLO negative gastritis takes Oxycodone and Welbutrin  MEDICATIONS:   MAC sedation, administered by CRNA TOPICAL ANESTHETIC:  Cetacaine Spray  DESCRIPTION OF PROCEDURE:   After the risks benefits and alternatives of the procedure were thoroughly explained, informed consent was obtained.  The Pentax Gastroscope [id number] endoscope was introduced through the mouth and advanced to the second portion of the duodenum, without limitations.  The instrument was slowly withdrawn as the mucosa was fully examined. <<PROCEDUREIMAGES>>  Esophagitis was found. one 5 mm tongue of gastric mucosa extending into the esophagus With standard forceps, a biopsy was obtained and sent to pathology (see image1 and image6). r/o Barrett's Otherwise the examination was normal (see image5, image4, image3, and image2).    Retroflexed views revealed no abnormalities. The scope was then withdrawn from the patient and the procedure completed.  COMPLICATIONS:  None  ENDOSCOPIC IMPRESSION: 1) Esophagitis 2) Otherwise normal examination normal gastric outlet, no retained food RECOMMENDATIONS: 1) Await biopsy results continue PPI, gastroparesis diet, Phenergan 12.5 mg prn  REPEAT EXAM:  In 0 year(s) for.  ______________________________ Hedwig Morton. Juanda Chance, MD  CC:  n. eSIGNED:   Hedwig Morton. Ellwood Steidle at 08/17/2011 10:26 AM  [place], [place], [id number]

## 2011-08-18 ENCOUNTER — Encounter: Payer: Self-pay | Admitting: Internal Medicine

## 2011-08-18 ENCOUNTER — Encounter (HOSPITAL_COMMUNITY): Payer: Self-pay | Admitting: Internal Medicine

## 2011-08-25 ENCOUNTER — Other Ambulatory Visit: Payer: Self-pay

## 2011-08-25 MED ORDER — OXYCODONE-ACETAMINOPHEN 10-325 MG PO TABS: 1.0000 | ORAL_TABLET | ORAL | Status: DC | PRN

## 2011-08-25 NOTE — Telephone Encounter (Signed)
Pt informed, Rx in cabinet for pt pick up  

## 2011-09-29 ENCOUNTER — Other Ambulatory Visit: Payer: Self-pay

## 2011-09-29 MED ORDER — OXYCODONE-ACETAMINOPHEN 10-325 MG PO TABS: 1.0000 | ORAL_TABLET | ORAL | Status: DC | PRN

## 2011-09-29 NOTE — Telephone Encounter (Signed)
Pt informed, Rx in cabinet for pt pick up. Pt transferred to schedul

## 2011-09-29 NOTE — Telephone Encounter (Signed)
i printed "medicare wellness" ov is due 

## 2011-09-30 ENCOUNTER — Encounter: Payer: Self-pay | Admitting: Endocrinology

## 2011-09-30 ENCOUNTER — Ambulatory Visit (INDEPENDENT_AMBULATORY_CARE_PROVIDER_SITE_OTHER): Payer: Medicare Other | Admitting: Endocrinology

## 2011-09-30 VITALS — BP 144/88 | HR 84 | Temp 98.4°F | Ht 67.0 in | Wt 380.0 lb

## 2011-09-30 DIAGNOSIS — E109 Type 1 diabetes mellitus without complications: Secondary | ICD-10-CM

## 2011-09-30 DIAGNOSIS — L259 Unspecified contact dermatitis, unspecified cause: Secondary | ICD-10-CM

## 2011-09-30 DIAGNOSIS — E1059 Type 1 diabetes mellitus with other circulatory complications: Secondary | ICD-10-CM

## 2011-09-30 DIAGNOSIS — I798 Other disorders of arteries, arterioles and capillaries in diseases classified elsewhere: Secondary | ICD-10-CM

## 2011-09-30 DIAGNOSIS — M549 Dorsalgia, unspecified: Secondary | ICD-10-CM

## 2011-09-30 MED ORDER — FLUTICASONE PROPIONATE 0.05 % EX CREA: TOPICAL_CREAM | Freq: Three times a day (TID) | CUTANEOUS | Status: DC | PRN

## 2011-09-30 NOTE — Patient Instructions (Addendum)
take levemir to 600 units each morning, and 50 each evening. Please make a "medicare wellness" appointment.   i'll ask a nurse to go to your home to see what help you need with diabetes.   Here are 2 "flector" patches.  Take 1 every 12 hours.  Call if you want me to send e a prescription for you.  You can take the pain medication with this.   i have sent a prescription to your pharmacy, for a skin cream.

## 2011-09-30 NOTE — Progress Notes (Signed)
Subjective:    Patient ID: [name], female    DOB: 11/15/1940, 71 y.o.   MRN: [id number]  HPI Pt returns for f/u of insulin-requiring DM ([id number]; complicated by CVA), characterized by severe insulin resistance. no cbg record, but states cbg's are 60-500. It is in general higher as the day goes on.  She does not know how much insulin she is on.   Pt states few years of severe low-back pain, and assoc numbness of the feet.  She says she was told by ortho that the only option other than pain medication was surgery.   Past Medical History  Diagnosis Date  . COLONIC POLYPS 09/13/2007  . DIABETES MELLITUS, TYPE I 10/29/2006  . HYPERCHOLESTEROLEMIA 02/25/2009  . HYPONATREMIA 04/22/2008  . HYPOKALEMIA 01/15/2008  . SMOKER 02/22/2008  . DEPRESSION 02/22/2008  . HYPERTENSION 12/20/2008  . 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 ALLERGIC RHINITIS 10/29/2006  . COPD 04/22/2009  . GERD 10/29/2006  . CIRRHOSIS 10/29/2006  . DEGENERATIVE JOINT DISEASE 06/15/2007  . KNEE PAIN, RIGHT 02/03/2010  . OSTEOPOROSIS 10/29/2006  . HYPERSOMNIA, ASSOCIATED WITH SLEEP APNEA 05/07/2009  . DYSPNEA 02/22/2008  . OTHER DYSPNEA AND RESPIRATORY ABNORMALITIES 04/23/2009  . CHEST PAIN 02/22/2008  . ASYMPTOMATIC POSTMENOPAUSAL STATUS 02/22/2008  . Gastroparesis   . Fatty liver   . Gastritis   . Diverticulosis   . Hyperplastic colon polyp   . OSA (obstructive sleep apnea)   . Cholelithiasis   . CHF (congestive heart failure)   . Stroke     mild stroke  . SEIZURE DISORDER 01/06/2010    pt not sure  . Headache     Past Surgical History  Procedure Date  . Appendectomy   . Abdominal hysterectomy 1980's  . (r) leg broken     when she was 18  . Catartact surgery   . Breast surgery 1986 or 1987    Breast reduction  . Esophagogastroduodenoscopy 08/17/2011    Procedure: ESOPHAGOGASTRODUODENOSCOPY (EGD);  Surgeon: Hart Carwin, MD;  Location: [facility];  Service: Endoscopy;  Laterality:  N/A;  . Colonoscopy 08/17/2011    Procedure: COLONOSCOPY;  Surgeon: Hart Carwin, MD;  Location: [facility];  Service: Endoscopy;  Laterality: N/A;    History   Social History  . Marital Status: Widowed    Spouse Name: N/A    Number of Children: 5  . Years of Education: N/A   Occupational History  . Retired    Social History Main Topics  . Smoking status: Current Everyday Smoker -- 1.0 packs/day  . Smokeless tobacco: Never Used  . Alcohol Use: No  . Drug Use: No  . Sexually Active: Not on file   Other Topics Concern  . Not on file   Social History Narrative  . No narrative on file    Current Outpatient Prescriptions on File Prior to Visit  Medication Sig Dispense Refill  . aspirin 81 MG tablet Take 81 mg by mouth daily.        . budesonide-formoterol (SYMBICORT) 160-4.5 MCG/ACT inhaler Inhale 2 puffs into the lungs 2 (two) times daily.        [name] buPROPion (WELLBUTRIN XL) 300 MG 24 hr tablet Take 1 tablet (300 mg total) by mouth daily.  30 tablet  11  . furosemide (LASIX) 20 MG tablet TAKE 1 TABLET BY MOUTH EVERY DAY  90 tablet  2  . glucose blood (TRUETRACK TEST) test strip 1 each by Other route as needed. Use as  instructed       . insulin detemir (LEVEMIR) 100 UNIT/ML injection 525 units each morning, and 50 units in the evening (THIS DOSAGE WAS CONFIRMED WITH PT PHARMACY 08/03/11)      . Insulin Pen Needle (B-D ULTRAFINE III SHORT PEN) 31G X 8 MM MISC 1 each by Does not apply route as directed.  100 each  2  . losartan-hydrochlorothiazide (HYZAAR) 100-25 MG per tablet Take 1 tablet by mouth daily.        . metoCLOPramide (REGLAN) 10 MG tablet Take 10 mg by mouth every 6 (six) hours as needed.        [name] omeprazole (PRILOSEC) 40 MG capsule Take 1 capsule (40 mg total) by mouth daily.  30 capsule  11  . oxyCODONE-acetaminophen (PERCOCET) 10-325 MG per tablet Take 1 tablet by mouth every 4 (four) hours as needed. With maximum 4 tabs per day, For pain  120 tablet  0  .  potassium chloride SA (K-DUR,KLOR-CON) 20 MEQ tablet TAKE 1 TABLET BY MOUTH EVERY DAY WITH FOOD  90 tablet  2  . Probiotic Product (ALIGN) 4 MG CAPS Take 1 capsule by mouth daily.  7 capsule  0  . simvastatin (ZOCOR) 40 MG tablet Take 40 mg by mouth at bedtime.        . triamcinolone (KENALOG) 0.1 % cream Apply topically 3 (three) times daily. Prn for itching  30 g  2  . zolpidem (AMBIEN) 5 MG tablet Take 5 mg by mouth at bedtime as needed.          Allergies  Allergen Reactions  . Lisinopril     REACTION: Cough  . Pioglitazone     REACTION: Edema  . Varenicline Tartrate     REACTION: bad dreams    Family History  Problem Relation Age of Onset  . Ovarian cancer Mother   . Diabetes Mother   . Diabetes Other   . Heart disease Mother   . Heart disease Father   . Heart disease Maternal Grandmother   . Heart disease Sister   . Colon cancer Mother   . Clotting disorder Sister     BP 144/88  Pulse 84  Temp 98.4 F (36.9 C) (Oral)  Ht 5\' 7"  (1.702 m)  Wt 380 lb (172.367 kg)  BMI 59.52 kg/m2  SpO2 93%  Review of Systems Denies weight change and bowel/bladder retention.      Objective:   Physical Exam VITAL SIGNS:  See vs page GENERAL: no distress Spine: nontender.   Gait: steady with a cane.   Severe eczematous rash on the necklace area and flexor surfaces of the limbs.    Lab Results  Component Value Date   HGBA1C 10.1* 06/28/2011      Assessment & Plan:  DM.  therapy limited by noncompliance.  i'll do the best i can.  She does not know how many units of insulin she takes.   Low-back pain, persistent Eczema, persistent

## 2011-10-01 ENCOUNTER — Telehealth: Payer: Self-pay | Admitting: *Deleted

## 2011-10-01 NOTE — Telephone Encounter (Signed)
Referred to [facility] Care Management for help with DM medications per Dr. Everardo All.

## 2011-10-25 ENCOUNTER — Telehealth: Payer: Self-pay | Admitting: *Deleted

## 2011-10-25 ENCOUNTER — Ambulatory Visit (INDEPENDENT_AMBULATORY_CARE_PROVIDER_SITE_OTHER): Payer: Medicare Other | Admitting: Endocrinology

## 2011-10-25 ENCOUNTER — Encounter: Payer: Self-pay | Admitting: Endocrinology

## 2011-10-25 ENCOUNTER — Other Ambulatory Visit (INDEPENDENT_AMBULATORY_CARE_PROVIDER_SITE_OTHER): Payer: Medicare Other

## 2011-10-25 VITALS — BP 138/82 | HR 103 | Temp 98.8°F | Ht 67.0 in | Wt 370.0 lb

## 2011-10-25 DIAGNOSIS — I798 Other disorders of arteries, arterioles and capillaries in diseases classified elsewhere: Secondary | ICD-10-CM

## 2011-10-25 DIAGNOSIS — E109 Type 1 diabetes mellitus without complications: Secondary | ICD-10-CM

## 2011-10-25 DIAGNOSIS — Z0000 Encounter for general adult medical examination without abnormal findings: Secondary | ICD-10-CM

## 2011-10-25 DIAGNOSIS — R0609 Other forms of dyspnea: Secondary | ICD-10-CM

## 2011-10-25 DIAGNOSIS — R0600 Dyspnea, unspecified: Secondary | ICD-10-CM

## 2011-10-25 DIAGNOSIS — E1059 Type 1 diabetes mellitus with other circulatory complications: Secondary | ICD-10-CM

## 2011-10-25 DIAGNOSIS — R0989 Other specified symptoms and signs involving the circulatory and respiratory systems: Secondary | ICD-10-CM

## 2011-10-25 LAB — HEMOGLOBIN A1C: Hgb A1c MFr Bld: 10.1 % — ABNORMAL HIGH (ref 4.6–6.5)

## 2011-10-25 MED ORDER — OXYCODONE-ACETAMINOPHEN 10-325 MG PO TABS: 1.0000 | ORAL_TABLET | ORAL | Status: DC | PRN

## 2011-10-25 MED ORDER — SIMVASTATIN 40 MG PO TABS: 40.0000 mg | ORAL_TABLET | Freq: Every day | ORAL | Status: DC

## 2011-10-25 MED ORDER — FUROSEMIDE 20 MG PO TABS: ORAL_TABLET | ORAL | Status: DC

## 2011-10-25 MED ORDER — BUPROPION HCL ER (XL) 300 MG PO TB24: 300.0000 mg | ORAL_TABLET | Freq: Every day | ORAL | Status: DC

## 2011-10-25 MED ORDER — AMLODIPINE BESYLATE 2.5 MG PO TABS: 2.5000 mg | ORAL_TABLET | Freq: Every day | ORAL | Status: DC

## 2011-10-25 NOTE — Patient Instructions (Addendum)
blood tests are being requested for you today.  You will receive a letter with results. Increase the insulin to 600 units each morning.   Please come back for a follow-up appointment in 3 months. Today's breathing test tells [place] that weight loss would help your breathing. please consider these measures for your health:  minimize alcohol.  do not use tobacco products.  have a colonoscopy at least every 10 years from age 70.  Women should have an annual mammogram from age 56.  keep firearms safely stored.  always use seat belts.  have working smoke alarms in your home.  see an eye doctor and dentist regularly.  never drive under the influence of alcohol or drugs (including prescription drugs).   please let me know what your wishes would be, if artificial life support measures should become necessary.  it is critically important to prevent falling down (keep floor areas well-lit, dry, and free of loose objects.  If you have a cane, walker, or wheelchair, you should use it, even for short trips around the house.  Also, try not to rush) Refer to an eye specialist.  you will receive a phone call, about a day and time for an appointment Please call to set up another mammogram.   good diet and exercise habits significanly improve the control of your diabetes.  please let me know if you wish to be referred to a dietician.  high blood sugar is very risky to your health.  you should see an eye doctor every year. You should have a vaccine against shingles (a painful rash which results from the  chickenpox infection which most people had many years ago).  This vaccine reduces, but does not totally eliminate the risk of shingles.  Because this is a medicare part d benefit, you should get it at a pharmacy.

## 2011-10-25 NOTE — Progress Notes (Signed)
Subjective:    Patient ID: [name], female    DOB: 06/09/1940, 71 y.o.   MRN: [id number]  HPI Pt returns for f/u of insulin-requiring DM (dx'ed 1997; complicated by PAD), characterized by severe insulin resistance.  no cbg record, but states cbg's are well-controlled.   Past Medical History  Diagnosis Date  . COLONIC POLYPS 09/13/2007  . DIABETES MELLITUS, TYPE I 10/29/2006  . HYPERCHOLESTEROLEMIA 02/25/2009  . HYPONATREMIA 04/22/2008  . HYPOKALEMIA 01/15/2008  . SMOKER 02/22/2008  . DEPRESSION 02/22/2008  . HYPERTENSION 12/20/2008  . 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 ALLERGIC RHINITIS 10/29/2006  . COPD 04/22/2009  . GERD 10/29/2006  . CIRRHOSIS 10/29/2006  . DEGENERATIVE JOINT DISEASE 06/15/2007  . KNEE PAIN, RIGHT 02/03/2010  . OSTEOPOROSIS 10/29/2006  . HYPERSOMNIA, ASSOCIATED WITH SLEEP APNEA 05/07/2009  . DYSPNEA 02/22/2008  . OTHER DYSPNEA AND RESPIRATORY ABNORMALITIES 04/23/2009  . CHEST PAIN 02/22/2008  . ASYMPTOMATIC POSTMENOPAUSAL STATUS 02/22/2008  . Gastroparesis   . Fatty liver   . Gastritis   . Diverticulosis   . Hyperplastic colon polyp   . OSA (obstructive sleep apnea)   . Cholelithiasis   . CHF (congestive heart failure)   . Stroke     mild stroke  . SEIZURE DISORDER 01/06/2010    pt not sure  . Headache     Past Surgical History  Procedure Date  . Appendectomy   . Abdominal hysterectomy 1980's  . (r) leg broken     when she was 18  . Catartact surgery   . Breast surgery 1986 or 1987    Breast reduction  . Esophagogastroduodenoscopy 08/17/2011    Procedure: ESOPHAGOGASTRODUODENOSCOPY (EGD);  Surgeon: Hart Carwin, MD;  Location: [facility];  Service: Endoscopy;  Laterality: N/A;  . Colonoscopy 08/17/2011    Procedure: COLONOSCOPY;  Surgeon: Hart Carwin, MD;  Location: [facility];  Service: Endoscopy;  Laterality: N/A;    History   Social History  . Marital Status: Widowed    Spouse Name: N/A    Number of  Children: 5  . Years of Education: N/A   Occupational History  . Retired    Social History Main Topics  . Smoking status: Current Everyday Smoker -- 1.0 packs/day  . Smokeless tobacco: Never Used  . Alcohol Use: No  . Drug Use: No  . Sexually Active: Not on file   Other Topics Concern  . Not on file   Social History Narrative  . No narrative on file    Current Outpatient Prescriptions on File Prior to Visit  Medication Sig Dispense Refill  . aspirin 81 MG tablet Take 81 mg by mouth daily.        . budesonide-formoterol (SYMBICORT) 160-4.5 MCG/ACT inhaler Inhale 2 puffs into the lungs 2 (two) times daily.        [name] buPROPion (WELLBUTRIN XL) 300 MG 24 hr tablet Take 1 tablet (300 mg total) by mouth daily.  30 tablet  11  . fluticasone (CUTIVATE) 0.05 % cream Apply topically 3 (three) times daily as needed. For rash  60 g  3  . furosemide (LASIX) 20 MG tablet TAKE 1 TABLET BY MOUTH EVERY DAY  90 tablet  3  . glucose blood (TRUETRACK TEST) test strip 1 each by Other route as needed. Use as instructed       . insulin detemir (LEVEMIR) 100 UNIT/ML injection Inject 600 Units into the skin every morning.       . Insulin Pen  Needle (B-D ULTRAFINE III SHORT PEN) 31G X 8 MM MISC 1 each by Does not apply route as directed.  100 each  2  . losartan-hydrochlorothiazide (HYZAAR) 100-25 MG per tablet Take 1 tablet by mouth daily.        . metoCLOPramide (REGLAN) 10 MG tablet Take 10 mg by mouth every 6 (six) hours as needed.        [name] omeprazole (PRILOSEC) 40 MG capsule Take 1 capsule (40 mg total) by mouth daily.  30 capsule  11  . potassium chloride SA (K-DUR,KLOR-CON) 20 MEQ tablet TAKE 1 TABLET BY MOUTH EVERY DAY WITH FOOD  90 tablet  2  . Probiotic Product (ALIGN) 4 MG CAPS Take 1 capsule by mouth daily.  7 capsule  0  . simvastatin (ZOCOR) 40 MG tablet Take 1 tablet (40 mg total) by mouth at bedtime.  30 tablet  11  . triamcinolone (KENALOG) 0.1 % cream Apply topically 3 (three) times daily.  Prn for itching  30 g  2  . zolpidem (AMBIEN) 5 MG tablet Take 5 mg by mouth at bedtime as needed.          Allergies  Allergen Reactions  . Lisinopril     REACTION: Cough  . Pioglitazone     REACTION: Edema  . Varenicline Tartrate     REACTION: bad dreams    Family History  Problem Relation Age of Onset  . Ovarian cancer Mother   . Diabetes Mother   . Diabetes Other   . Heart disease Mother   . Heart disease Father   . Heart disease Maternal Grandmother   . Heart disease Sister   . Colon cancer Mother   . Clotting disorder Sister     BP 138/82  Pulse 103  Temp 98.8 F (37.1 C) (Oral)  Ht 5\' 7"  (1.702 m)  Wt 370 lb (167.831 kg)  BMI 57.95 kg/m2  SpO2 94%  Review of Systems She has no change in her chronic doe    Objective:   Physical Exam Pulses: dorsalis pedis intact bilat.   Feet: no deformity.  no ulcer on the feet.  feet are of normal color and temp.  2+ bilat leg edema Neuro: sensation is intact to touch on the feet Lab Results  Component Value Date   HGBA1C 10.1* 10/25/2011      Assessment & Plan:  DM.  needs increased rx. DOE, due to obesity.   Subjective:   Patient here for Medicare annual wellness visit and management of other chronic and acute problems.     Risk factors: advanced age    Roster of Physicians Providing Medical Care to Patient:  See "snapshot"   Activities of Daily Living: In your present state of health, do you have any difficulty performing the following activities?:  Preparing food and eating?: No  Bathing yourself: yes--pt has aide Getting dressed: No  Using the toilet: No  Moving around from place to place: No  In the past year have you fallen or had a near fall?: No    Home Safety: Has smoke detector and wears seat belts. No firearms.  Diet and Exercise  Current exercise habits: pt says limited by health probs. Dietary issues discussed: pt reports a healthy diet   Depression Screen  Q1: Over the past two weeks,  have you felt down, depressed or hopeless? no  Q2: Over the past two weeks, have you felt little interest or pleasure in doing things? no  The following portions of the patient's history were reviewed and updated as appropriate: allergies, current medications, past family history, past medical history, past social history, past surgical history and problem list.   Review of Systems  Denies hearing loss, and visual loss Objective:   Vision:  Sees opthalmologist Hearing: grossly normal Body mass index:  See vs page Msk: pt slowly performs "get-up-and-go" from a sitting position, with the aid of a cane. Cognitive Impairment Assessment: cognition, memory and judgment appear normal.  remembers 3/3 at 5 minutes.  excellent recall.  can easily read and write a sentence.  alert and oriented x 3   Assessment:   Medicare wellness utd on preventive parameters    Plan:   During the course of the visit the patient was educated and counseled about appropriate screening and preventive services including:        Fall prevention   Screening mammography  Bone densitometry screening---pt can't do due to obesity Diabetes screening  Nutrition counseling   Vaccines / LABS Zostavax / Pnemonccoal Vaccine  today   Patient Instructions (the written plan) was given to the patient.

## 2011-10-25 NOTE — Telephone Encounter (Signed)
Called pt to inform of lab results, left message for pt to callback office (letter also mailed to pt). 

## 2011-10-26 NOTE — Telephone Encounter (Signed)
Pt informed of lab results. 

## 2011-11-22 ENCOUNTER — Other Ambulatory Visit: Payer: Self-pay

## 2011-11-22 LAB — HM DIABETES EYE EXAM

## 2011-11-22 MED ORDER — OXYCODONE-ACETAMINOPHEN 10-325 MG PO TABS: 1.0000 | ORAL_TABLET | ORAL | Status: DC | PRN

## 2011-11-22 NOTE — Telephone Encounter (Signed)
Pt informed, Rx in cabinet for pt pick up  

## 2011-11-25 ENCOUNTER — Encounter (HOSPITAL_COMMUNITY): Payer: Self-pay | Admitting: Emergency Medicine

## 2011-11-25 ENCOUNTER — Emergency Department (HOSPITAL_COMMUNITY): Payer: Medicare Other

## 2011-11-25 ENCOUNTER — Emergency Department (HOSPITAL_COMMUNITY)
Admission: EM | Admit: 2011-11-25 | Discharge: 2011-11-25 | Disposition: A | Discharge status: Home / Self Care | Payer: Medicare Other | Attending: Emergency Medicine | Admitting: Emergency Medicine

## 2011-11-25 DIAGNOSIS — N390 Urinary tract infection, site not specified: Secondary | ICD-10-CM

## 2011-11-25 DIAGNOSIS — Z794 Long term (current) use of insulin: Secondary | ICD-10-CM | POA: Insufficient documentation

## 2011-11-25 DIAGNOSIS — I69959 Hemiplegia and hemiparesis following unspecified cerebrovascular disease affecting unspecified side: Secondary | ICD-10-CM | POA: Insufficient documentation

## 2011-11-25 DIAGNOSIS — E7800 Pure hypercholesterolemia, unspecified: Secondary | ICD-10-CM | POA: Insufficient documentation

## 2011-11-25 DIAGNOSIS — E109 Type 1 diabetes mellitus without complications: Secondary | ICD-10-CM | POA: Insufficient documentation

## 2011-11-25 DIAGNOSIS — G4733 Obstructive sleep apnea (adult) (pediatric): Secondary | ICD-10-CM | POA: Insufficient documentation

## 2011-11-25 DIAGNOSIS — R739 Hyperglycemia, unspecified: Secondary | ICD-10-CM

## 2011-11-25 DIAGNOSIS — F17200 Nicotine dependence, unspecified, uncomplicated: Secondary | ICD-10-CM | POA: Insufficient documentation

## 2011-11-25 DIAGNOSIS — J449 Chronic obstructive pulmonary disease, unspecified: Secondary | ICD-10-CM | POA: Insufficient documentation

## 2011-11-25 DIAGNOSIS — Z79899 Other long term (current) drug therapy: Secondary | ICD-10-CM | POA: Insufficient documentation

## 2011-11-25 DIAGNOSIS — M810 Age-related osteoporosis without current pathological fracture: Secondary | ICD-10-CM | POA: Insufficient documentation

## 2011-11-25 DIAGNOSIS — I509 Heart failure, unspecified: Secondary | ICD-10-CM | POA: Insufficient documentation

## 2011-11-25 DIAGNOSIS — J4489 Other specified chronic obstructive pulmonary disease: Secondary | ICD-10-CM | POA: Insufficient documentation

## 2011-11-25 DIAGNOSIS — I10 Essential (primary) hypertension: Secondary | ICD-10-CM | POA: Insufficient documentation

## 2011-11-25 DIAGNOSIS — G40909 Epilepsy, unspecified, not intractable, without status epilepticus: Secondary | ICD-10-CM | POA: Insufficient documentation

## 2011-11-25 LAB — URINALYSIS, ROUTINE W REFLEX MICROSCOPIC
Bilirubin Urine: NEGATIVE
Nitrite: POSITIVE — AB
Specific Gravity, Urine: 1.02 (ref 1.005–1.030)
Urobilinogen, UA: 1 mg/dL (ref 0.0–1.0)

## 2011-11-25 LAB — CBC
Hemoglobin: 15.7 g/dL — ABNORMAL HIGH (ref 12.0–15.0)
MCH: 33.1 pg (ref 26.0–34.0)
MCHC: 34.7 g/dL (ref 30.0–36.0)
Platelets: 244 10*3/uL (ref 150–400)

## 2011-11-25 LAB — BASIC METABOLIC PANEL
Calcium: 9.9 mg/dL (ref 8.4–10.5)
GFR calc non Af Amer: 66 mL/min — ABNORMAL LOW (ref >90)
Glucose, Bld: 204 mg/dL — ABNORMAL HIGH (ref 70–99)
Sodium: 139 mEq/L (ref 135–145)

## 2011-11-25 LAB — URINE MICROSCOPIC-ADD ON

## 2011-11-25 MED ORDER — SODIUM CHLORIDE 0.9 % IV SOLN
INTRAVENOUS | Status: DC
  Administered 2011-11-25: 20:00:00 via INTRAVENOUS

## 2011-11-25 MED ORDER — CEPHALEXIN 250 MG PO CAPS
250.0000 mg | ORAL_CAPSULE | Freq: Once | ORAL | Status: AC
  Administered 2011-11-25: 250 mg via ORAL
  Filled 2011-11-25: qty 1

## 2011-11-25 MED ORDER — CEPHALEXIN 250 MG PO CAPS
250.0000 mg | ORAL_CAPSULE | Freq: Four times a day (QID) | ORAL | Status: DC
Start: 2011-11-25 — End: 2011-11-25

## 2011-11-25 MED ORDER — CEPHALEXIN 250 MG PO CAPS: 250.0000 mg | ORAL_CAPSULE | Freq: Four times a day (QID) | ORAL | Status: AC

## 2011-11-25 NOTE — ED Notes (Addendum)
Pt reports hurt BS was 513 this am, then she took her insulin; the next BS checks at home were 293 and 126; checked at triage and 188; reports some loose stool and vomiting--reports that happens when her sugar drops like it has; pt reports sob but reports that this is normal for her and has not gotten any worse

## 2011-11-25 NOTE — ED Provider Notes (Addendum)
History  Scribed for Celene Kras, MD, the patient was seen in room TR10C/TR10C. This chart was scribed by Candelaria Stagers. The patient's care started at 7:01 PM   CSN: [id number]  Arrival date & time 11/25/11  1742   None     Chief Complaint  Patient presents with  . Hyperglycemia    The history is provided by the patient.   [name] is a 71 y.o. female who presents to the Emergency Department complaining of HS that she reports was 513 this morning.  She reports that the next home checks were 293 and 126.  She reports that she has been having difficulty managing her BS levels recently.  She is also experiencing loose stools and vomiting.  She denies SOB or chest pain.   Past Medical History  Diagnosis Date  . COLONIC POLYPS 09/13/2007  . DIABETES MELLITUS, TYPE I 10/29/2006  . HYPERCHOLESTEROLEMIA 02/25/2009  . HYPONATREMIA 04/22/2008  . HYPOKALEMIA 01/15/2008  . SMOKER 02/22/2008  . DEPRESSION 02/22/2008  . HYPERTENSION 12/20/2008  . 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 ALLERGIC RHINITIS 10/29/2006  . COPD 04/22/2009  . GERD 10/29/2006  . CIRRHOSIS 10/29/2006  . DEGENERATIVE JOINT DISEASE 06/15/2007  . KNEE PAIN, RIGHT 02/03/2010  . OSTEOPOROSIS 10/29/2006  . HYPERSOMNIA, ASSOCIATED WITH SLEEP APNEA 05/07/2009  . DYSPNEA 02/22/2008  . OTHER DYSPNEA AND RESPIRATORY ABNORMALITIES 04/23/2009  . CHEST PAIN 02/22/2008  . ASYMPTOMATIC POSTMENOPAUSAL STATUS 02/22/2008  . Gastroparesis   . Fatty liver   . Gastritis   . Diverticulosis   . Hyperplastic colon polyp   . OSA (obstructive sleep apnea)   . Cholelithiasis   . CHF (congestive heart failure)   . Stroke     mild stroke  . SEIZURE DISORDER 01/06/2010    pt not sure  . Headache     Past Surgical History  Procedure Date  . Appendectomy   . Abdominal hysterectomy 1980's  . (r) leg broken     when she was 18  . Catartact surgery   . Breast surgery 1986 or 1987    Breast reduction  .  Esophagogastroduodenoscopy 08/17/2011    Procedure: ESOPHAGOGASTRODUODENOSCOPY (EGD);  Surgeon: Hart Carwin, MD;  Location: [facility];  Service: Endoscopy;  Laterality: N/A;  . Colonoscopy 08/17/2011    Procedure: COLONOSCOPY;  Surgeon: Hart Carwin, MD;  Location: [facility];  Service: Endoscopy;  Laterality: N/A;    Family History  Problem Relation Age of Onset  . Ovarian cancer Mother   . Diabetes Mother   . Diabetes Other   . Heart disease Mother   . Heart disease Father   . Heart disease Maternal Grandmother   . Heart disease Sister   . Colon cancer Mother   . Clotting disorder Sister     History  Substance Use Topics  . Smoking status: Current Everyday Smoker -- 0.2 packs/day  . Smokeless tobacco: Never Used  . Alcohol Use: No    OB History    Grav Para Term Preterm Abortions TAB SAB Ect Mult Living                  Review of Systems  Constitutional: Negative for fever.       10 Systems reviewed and are negative for acute change except as noted in the HPI.  Hyperglycemia.    HENT: Negative for congestion.   Eyes: Negative for discharge and redness.  Respiratory: Negative for cough and shortness of breath.  Cardiovascular: Negative for chest pain.  Gastrointestinal: Negative for vomiting and abdominal pain.  Musculoskeletal: Negative for back pain.  Skin: Negative for rash.  Neurological: Negative for syncope, numbness and headaches.  Psychiatric/Behavioral:       No behavior change.    Allergies  Lisinopril; Pioglitazone; and Varenicline tartrate  Home Medications   Current Outpatient Rx  Name Route Sig Dispense Refill  . AMLODIPINE BESYLATE 2.5 MG PO TABS Oral Take 1 tablet (2.5 mg total) by mouth daily. 90 tablet 3  . ASPIRIN 81 MG PO TABS Oral Take 81 mg by mouth daily.      . BUDESONIDE-FORMOTEROL FUMARATE 160-4.5 MCG/ACT IN AERO Inhalation Inhale 2 puffs into the lungs 2 (two) times daily.      . BUPROPION HCL ER (XL) 300 MG PO TB24 Oral Take  1 tablet (300 mg total) by mouth daily. 30 tablet 11  . FLUTICASONE PROPIONATE 0.05 % EX CREA Topical Apply topically 3 (three) times daily as needed. For rash 60 g 3  . FUROSEMIDE 20 MG PO TABS  TAKE 1 TABLET BY MOUTH EVERY DAY 90 tablet 3  . GLUCOSE BLOOD VI STRP Other 1 each by Other route as needed. Use as instructed     . INSULIN DETEMIR 100 UNIT/ML [place] SOLN Subcutaneous Inject 600 Units into the skin every morning.     . INSULIN PEN NEEDLE 31G X 8 MM MISC Does not apply 1 each by Does not apply route as directed. 100 each 2  . LOSARTAN POTASSIUM-HCTZ 100-25 MG PO TABS Oral Take 1 tablet by mouth daily.      [name] METOCLOPRAMIDE HCL 10 MG PO TABS Oral Take 10 mg by mouth every 6 (six) hours as needed.      [name] OMEPRAZOLE 40 MG PO CPDR Oral Take 1 capsule (40 mg total) by mouth daily. 30 capsule 11  . OXYCODONE-ACETAMINOPHEN 10-325 MG PO TABS Oral Take 1 tablet by mouth every 4 (four) hours as needed. With maximum 4 tabs per day, For pain 120 tablet 0  . POTASSIUM CHLORIDE CRYS ER 20 MEQ PO TBCR  TAKE 1 TABLET BY MOUTH EVERY DAY WITH FOOD 90 tablet 2  . ALIGN 4 MG PO CAPS Oral Take 1 capsule by mouth daily. 7 capsule 0  . SIMVASTATIN 40 MG PO TABS Oral Take 1 tablet (40 mg total) by mouth at bedtime. 30 tablet 11  . TRIAMCINOLONE ACETONIDE 0.1 % EX CREA Topical Apply topically 3 (three) times daily. Prn for itching 30 g 2  . ZOLPIDEM TARTRATE 5 MG PO TABS Oral Take 5 mg by mouth at bedtime as needed.        BP 158/65  Pulse 88  Temp 98.8 F (37.1 C) (Oral)  Resp 22  SpO2 96%  Physical Exam  Nursing note and vitals reviewed. Constitutional: She appears well-developed and well-nourished. No distress.       Morbidly obese  HENT:  Head: Normocephalic and atraumatic.  Right Ear: External ear normal.  Left Ear: External ear normal.  Mouth/Throat: No oropharyngeal exudate.  Eyes: Conjunctivae are normal. Right eye exhibits no discharge. Left eye exhibits no discharge. No scleral icterus.    Neck: Neck supple. No tracheal deviation present.  Cardiovascular: Normal rate, regular rhythm and intact distal pulses.   Pulmonary/Chest: Effort normal and breath sounds normal. No stridor. No respiratory distress. She has no wheezes. She has no rales.  Abdominal: Soft. Bowel sounds are normal. She exhibits no distension. There is no tenderness.  There is no rebound and no guarding.       Protuberant, large pannus  Musculoskeletal: She exhibits tenderness (trace edema bilaterally). She exhibits no edema.  Neurological: She is alert. She has normal strength. No sensory deficit. Cranial nerve deficit:  no gross defecits noted. She exhibits normal muscle tone. She displays no seizure activity. Coordination normal.  Skin: Skin is warm and dry. No rash noted. She is not diaphoretic.  Psychiatric: She has a normal mood and affect.    ED Course  Procedures  DIAGNOSTIC STUDIES: Oxygen Saturation is 96% on room air, normal by my interpretation.    COORDINATION OF CARE:  1905 Ordered: DG Chest 2 View; Urinalysis, Routine w reflex microscopic; CBC; Basic metabolic panel; Glucose, capillary  Rate: 87  Rhythm: normal sinus rhythm  QRS Axis: normal  Intervals: normal  ST/T Wave abnormalities: normal  Conduction Disutrbances:none  Narrative Interpretation: low qrs  Old EKG Reviewed: no change   Labs Reviewed  GLUCOSE, CAPILLARY - Abnormal; Notable for the following:    Glucose-Capillary 188 (*)     All other components within normal limits  CBC  BASIC METABOLIC PANEL  URINALYSIS, ROUTINE W REFLEX MICROSCOPIC   No results found.     MDM  The patient has history of labile blood sugars. She noted elevated blood sugars this morning but the have improved during the day. In the emergency room now she's not significantly hyperglycemic. Patient states she has some chronic trouble with her breathing and did have an episode of loose stools and noted some dark color in the stool this morning. I  will check labs and electrolytes. Patient appears hemodynamically stable at this time. Do not feel there is need for IV fluid   I personally performed the services described in this documentation, which was scribed in my presence.  The recorded information has been reviewed and considered.    Celene Kras, MD 11/25/11 2012

## 2011-11-25 NOTE — ED Notes (Signed)
Patient notified that urine sample is needed. Will call when ready to urinate.

## 2011-11-25 NOTE — ED Provider Notes (Signed)
History     CSN: [id number]  Arrival date & time 11/25/11  1742   First MD Initiated Contact with Patient 11/25/11 1905      Chief Complaint  Patient presents with  . Hyperglycemia    (Consider location/radiation/quality/duration/timing/severity/associated sxs/prior treatment) HPI  Past Medical History  Diagnosis Date  . COLONIC POLYPS 09/13/2007  . DIABETES MELLITUS, TYPE I 10/29/2006  . HYPERCHOLESTEROLEMIA 02/25/2009  . HYPONATREMIA 04/22/2008  . HYPOKALEMIA 01/15/2008  . SMOKER 02/22/2008  . DEPRESSION 02/22/2008  . HYPERTENSION 12/20/2008  . 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 ALLERGIC RHINITIS 10/29/2006  . COPD 04/22/2009  . GERD 10/29/2006  . CIRRHOSIS 10/29/2006  . DEGENERATIVE JOINT DISEASE 06/15/2007  . KNEE PAIN, RIGHT 02/03/2010  . OSTEOPOROSIS 10/29/2006  . HYPERSOMNIA, ASSOCIATED WITH SLEEP APNEA 05/07/2009  . DYSPNEA 02/22/2008  . OTHER DYSPNEA AND RESPIRATORY ABNORMALITIES 04/23/2009  . CHEST PAIN 02/22/2008  . ASYMPTOMATIC POSTMENOPAUSAL STATUS 02/22/2008  . Gastroparesis   . Fatty liver   . Gastritis   . Diverticulosis   . Hyperplastic colon polyp   . OSA (obstructive sleep apnea)   . Cholelithiasis   . CHF (congestive heart failure)   . Stroke     mild stroke  . SEIZURE DISORDER 01/06/2010    pt not sure  . Headache     Past Surgical History  Procedure Date  . Appendectomy   . Abdominal hysterectomy 1980's  . (r) leg broken     when she was 18  . Catartact surgery   . Breast surgery 1986 or 1987    Breast reduction  . Esophagogastroduodenoscopy 08/17/2011    Procedure: ESOPHAGOGASTRODUODENOSCOPY (EGD);  Surgeon: Hart Carwin, MD;  Location: [facility];  Service: Endoscopy;  Laterality: N/A;  . Colonoscopy 08/17/2011    Procedure: COLONOSCOPY;  Surgeon: Hart Carwin, MD;  Location: [facility];  Service: Endoscopy;  Laterality: N/A;    Family History  Problem Relation Age of Onset  . Ovarian cancer Mother   . Diabetes  Mother   . Diabetes Other   . Heart disease Mother   . Heart disease Father   . Heart disease Maternal Grandmother   . Heart disease Sister   . Colon cancer Mother   . Clotting disorder Sister     History  Substance Use Topics  . Smoking status: Current Everyday Smoker -- 0.2 packs/day  . Smokeless tobacco: Never Used  . Alcohol Use: No    OB History    Grav Para Term Preterm Abortions TAB SAB Ect Mult Living                  Review of Systems  Allergies  Lisinopril; Pioglitazone; and Varenicline tartrate  Home Medications   Current Outpatient Rx  Name Route Sig Dispense Refill  . ASPIRIN 81 MG PO TABS Oral Take 81 mg by mouth daily.      . BUDESONIDE-FORMOTEROL FUMARATE 160-4.5 MCG/ACT IN AERO Inhalation Inhale 2 puffs into the lungs 2 (two) times daily.      . BUPROPION HCL ER (XL) 300 MG PO TB24 Oral Take 300 mg by mouth daily.    . FUROSEMIDE 20 MG PO TABS Oral Take 20 mg by mouth daily.    . INSULIN DETEMIR 100 UNIT/ML [place] SOLN Subcutaneous Inject 600 Units into the skin at bedtime.     [name] LOSARTAN POTASSIUM-HCTZ 100-25 MG PO TABS Oral Take 1 tablet by mouth daily.      [name] METOCLOPRAMIDE HCL 10  MG PO TABS Oral Take 10 mg by mouth every 6 (six) hours as needed. For nausea/vomiting    . OMEPRAZOLE 40 MG PO CPDR Oral Take 40 mg by mouth daily.    . OXYCODONE-ACETAMINOPHEN 10-325 MG PO TABS Oral Take 1 tablet by mouth every 4 (four) hours as needed. With maximum 4 tabs per day, For pain    . POTASSIUM CHLORIDE CRYS ER 20 MEQ PO TBCR Oral Take 20 mEq by mouth daily. Take with food    . ALIGN 4 MG PO CAPS Oral Take 1 capsule by mouth daily.    [name] SIMVASTATIN 40 MG PO TABS Oral Take 40 mg by mouth at bedtime.    . TRIAMCINOLONE ACETONIDE 0.1 % EX CREA Topical Apply 1 application topically 3 (three) times daily as needed. for itching    . ZOLPIDEM TARTRATE 5 MG PO TABS Oral Take 5 mg by mouth at bedtime as needed. For sleep    . CEPHALEXIN 250 MG PO CAPS Oral Take 1 capsule  (250 mg total) by mouth 4 (four) times daily. 28 capsule 0    BP 158/65  Pulse 88  Temp 98.8 F (37.1 C) (Oral)  Resp 22  SpO2 96%  Physical Exam  ED Course  Procedures (including critical care time)  Labs Reviewed  GLUCOSE, CAPILLARY - Abnormal; Notable for the following:    Glucose-Capillary 188 (*)     All other components within normal limits  CBC - Abnormal; Notable for the following:    Hemoglobin 15.7 (*)     All other components within normal limits  BASIC METABOLIC PANEL - Abnormal; Notable for the following:    Glucose, Bld 204 (*)     GFR calc non Af Amer 66 (*)     GFR calc Af Amer 76 (*)     All other components within normal limits  URINALYSIS, ROUTINE W REFLEX MICROSCOPIC - Abnormal; Notable for the following:    APPearance CLOUDY (*)     Nitrite POSITIVE (*)     Leukocytes, UA TRACE (*)     All other components within normal limits  URINE MICROSCOPIC-ADD ON - Abnormal; Notable for the following:    Squamous Epithelial / LPF MANY (*)     Bacteria, UA MANY (*)     All other components within normal limits  URINE CULTURE   Dg Chest 2 View  11/25/2011  *RADIOLOGY REPORT*  Clinical Data: Shortness of breath.  CHEST - 2 VIEW  Comparison: Chest x-ray 01/29/2011.  Findings: Lung volumes are normal.  No consolidative airspace disease.  No pleural effusions.  No pneumothorax.  No pulmonary nodule or mass noted.  Pulmonary vasculature and the cardiomediastinal silhouette are within normal limits.  IMPRESSION: 1. No radiographic evidence of acute cardiopulmonary disease.  Original Report Authenticated By: Florencia Reasons, M.D.     1. UTI (lower urinary tract infection)   2. Hyperglycemia       MDM    refuses patient's laboratory values, and she does have a urinary tract infection.  I will start her on antibiotic.  This is in agreement with Dr. Tawni Pummel, NP 11/25/11 2149

## 2011-11-26 NOTE — ED Provider Notes (Signed)
Medical screening examination/treatment/procedure(s) were conducted as a shared visit with non-physician practitioner(s) and myself.  I personally evaluated the patient during the encounter   [name] [name], MD 11/26/11 1104 

## 2011-11-30 LAB — URINE CULTURE: Colony Count: >=100000

## 2011-12-01 NOTE — ED Notes (Signed)
+   urine  Patient treated appropriately -sensitive to same-chart appended per protocol MD.  

## 2011-12-12 ENCOUNTER — Other Ambulatory Visit: Payer: Self-pay | Admitting: Endocrinology

## 2011-12-14 ENCOUNTER — Emergency Department (HOSPITAL_COMMUNITY): Payer: Medicare Other

## 2011-12-14 ENCOUNTER — Emergency Department (HOSPITAL_COMMUNITY)
Admission: EM | Admit: 2011-12-14 | Discharge: 2011-12-14 | Disposition: A | Discharge status: Home / Self Care | Payer: Medicare Other | Attending: Emergency Medicine | Admitting: Emergency Medicine

## 2011-12-14 ENCOUNTER — Encounter (HOSPITAL_COMMUNITY): Payer: Self-pay

## 2011-12-14 ENCOUNTER — Telehealth: Payer: Self-pay | Admitting: *Deleted

## 2011-12-14 DIAGNOSIS — Z8673 Personal history of transient ischemic attack (TIA), and cerebral infarction without residual deficits: Secondary | ICD-10-CM | POA: Insufficient documentation

## 2011-12-14 DIAGNOSIS — E119 Type 2 diabetes mellitus without complications: Secondary | ICD-10-CM | POA: Insufficient documentation

## 2011-12-14 DIAGNOSIS — J449 Chronic obstructive pulmonary disease, unspecified: Secondary | ICD-10-CM | POA: Insufficient documentation

## 2011-12-14 DIAGNOSIS — I10 Essential (primary) hypertension: Secondary | ICD-10-CM | POA: Insufficient documentation

## 2011-12-14 DIAGNOSIS — E785 Hyperlipidemia, unspecified: Secondary | ICD-10-CM | POA: Insufficient documentation

## 2011-12-14 DIAGNOSIS — J4489 Other specified chronic obstructive pulmonary disease: Secondary | ICD-10-CM | POA: Insufficient documentation

## 2011-12-14 DIAGNOSIS — I252 Old myocardial infarction: Secondary | ICD-10-CM | POA: Insufficient documentation

## 2011-12-14 DIAGNOSIS — K219 Gastro-esophageal reflux disease without esophagitis: Secondary | ICD-10-CM | POA: Insufficient documentation

## 2011-12-14 DIAGNOSIS — R51 Headache: Secondary | ICD-10-CM | POA: Insufficient documentation

## 2011-12-14 HISTORY — DX: Migraine, unspecified, not intractable, without status migrainosus: G43.909

## 2011-12-14 HISTORY — DX: Essential (primary) hypertension: I10

## 2011-12-14 HISTORY — DX: Hyperlipidemia, unspecified: E78.5

## 2011-12-14 LAB — POCT I-STAT, CHEM 8
Calcium, Ion: 1.18 mmol/L (ref 1.13–1.30)
HCT: 46 % (ref 36.0–46.0)
TCO2: 27 mmol/L (ref 0–100)

## 2011-12-14 LAB — COMPREHENSIVE METABOLIC PANEL
AST: 22 U/L (ref 0–37)
Alkaline Phosphatase: 109 U/L (ref 39–117)
CO2: 28 mEq/L (ref 19–32)
Chloride: 100 mEq/L (ref 96–112)
Creatinine, Ser: 0.9 mg/dL (ref 0.50–1.10)
GFR calc non Af Amer: 63 mL/min — ABNORMAL LOW (ref >90)
Potassium: 4.2 mEq/L (ref 3.5–5.1)
Total Bilirubin: 0.3 mg/dL (ref 0.3–1.2)

## 2011-12-14 LAB — CBC
HCT: 43.3 % (ref 36.0–46.0)
Hemoglobin: 14.5 g/dL (ref 12.0–15.0)
RDW: 13 % (ref 11.5–15.5)
WBC: 7.3 10*3/uL (ref 4.0–10.5)

## 2011-12-14 LAB — DIFFERENTIAL
Basophils Absolute: 0 10*3/uL (ref 0.0–0.1)
Lymphocytes Relative: 24 % (ref 12–46)
Monocytes Absolute: 0.6 10*3/uL (ref 0.1–1.0)
Monocytes Relative: 8 % (ref 3–12)
Neutro Abs: 4.9 10*3/uL (ref 1.7–7.7)
Neutrophils Relative %: 68 % (ref 43–77)

## 2011-12-14 LAB — APTT: aPTT: 29 seconds (ref 24–37)

## 2011-12-14 LAB — CK TOTAL AND CKMB (NOT AT ARMC): CK, MB: 2 ng/mL (ref 0.3–4.0)

## 2011-12-14 MED ORDER — HYDROCODONE-ACETAMINOPHEN 10-300 MG PO TABS: 1.0000 | ORAL_TABLET | Freq: Four times a day (QID) | ORAL | Status: DC | PRN

## 2011-12-14 MED ORDER — KETOROLAC TROMETHAMINE 30 MG/ML IJ SOLN
30.0000 mg | Freq: Once | INTRAMUSCULAR | Status: AC
  Administered 2011-12-14: 30 mg via INTRAVENOUS
  Filled 2011-12-14: qty 1

## 2011-12-14 MED ORDER — SODIUM CHLORIDE 0.9 % IV SOLN
Freq: Once | INTRAVENOUS | Status: AC
  Administered 2011-12-14: 1000 mL via INTRAVENOUS

## 2011-12-14 MED ORDER — DIPHENHYDRAMINE HCL 50 MG/ML IJ SOLN
25.0000 mg | Freq: Once | INTRAMUSCULAR | Status: AC
  Administered 2011-12-14: 25 mg via INTRAVENOUS
  Filled 2011-12-14: qty 1

## 2011-12-14 MED ORDER — METOCLOPRAMIDE HCL 5 MG/ML IJ SOLN
10.0000 mg | Freq: Once | INTRAMUSCULAR | Status: AC
  Administered 2011-12-14: 10 mg via INTRAVENOUS
  Filled 2011-12-14: qty 2

## 2011-12-14 NOTE — ED Notes (Signed)
Per ems- pt from home c/o migraine that began 2 days prior along with right sided weakness. Pt MD recommended pt to follow up with him next week, yet pt thought headache too severe. Pt has hx of CVA with right sided deficits. EMS neuro screen neg. Pt ambulatory, no drift, no facial droop. Pt anxious with ems. BP-170/84 HR-100 R-24

## 2011-12-14 NOTE — Telephone Encounter (Signed)
[facility] called, pt is in office today with them and pt has elevated BP of 210/90. Pt has not taken BP medication today but is having HA, blurred vision, and right sided weakness. Per Dr. Everardo All, pt needs to go to ER now. Marylene Land at [facility] informed of MD's advisement.

## 2011-12-14 NOTE — ED Provider Notes (Signed)
History     CSN: [id number]  Arrival date & time 12/14/11  1432   First MD Initiated Contact with Patient 12/14/11 1502      Chief Complaint  Patient presents with  . Headache    (Consider location/radiation/quality/duration/timing/severity/associated sxs/prior treatment) HPI Comments: Pt is a 71 yo woman who has had headache for several days.  The headache got worse yesterday.  It is felt in the frontal area, a throbbing pain, associated with numb feelings in the arms and legs.  She has had a prior CVA requiring treatment with TPA, which was associated with right-sided weakness.  She says that her blood pressure has been high recently, 210/70 yesterday.  Also, her blood sugar has been up and down.  She had called her endocrinologist, Romero Belling, M.D., but could not get in to be seen for a week, and therefore came to the ED for evaluation.  Patient is a 71 y.o. female presenting with headaches. The history is provided by the patient and medical records. No language interpreter was used.  Headache  This is a new problem. The current episode started more than 2 days ago. The problem occurs every few hours. The problem has been gradually worsening. The headache is associated with nothing. The pain is located in the frontal region. The quality of the pain is described as throbbing. The pain is moderate. The pain does not radiate. Associated symptoms include anorexia and nausea. Pertinent negatives include no vomiting. She has tried nothing for the symptoms.    Past Medical History  Diagnosis Date  . Stroke   . Hypertension   . Diabetes mellitus   . Hyperlipidemia   . GERD (gastroesophageal reflux disease)   . MI (myocardial infarction)   . COPD (chronic obstructive pulmonary disease)   . Seizure   . Migraine     No past surgical history on file.  No family history on file.  History  Substance Use Topics  . Smoking status: Not on file  . Smokeless tobacco: Not on file  .  Alcohol Use:     OB History    Grav Para Term Preterm Abortions TAB SAB Ect Mult Living                  Review of Systems  Constitutional:       Felt hot and cold, but did not take temperature.  Eyes:       Throbbing behind eyes at times.  Respiratory: Negative.   Cardiovascular: Negative.   Gastrointestinal: Positive for nausea and anorexia. Negative for vomiting and diarrhea.  Genitourinary: Negative.   Musculoskeletal:       Chronic back pain, for which she takes hydrocodone-acetaminophen 10/325.  Skin: Negative.   Neurological: Positive for headaches. Seizures: "Silent seizures," so pt was advised by Dr. Everardo All not to drive.  Psychiatric/Behavioral: Negative.     Allergies  Review of patient's allergies indicates no known allergies.  Home Medications  No current outpatient prescriptions on file.  BP 150/66  Pulse 90  Temp 98.1 F (36.7 C) (Oral)  Resp 20  SpO2 100%  Physical Exam  Nursing note and vitals reviewed. Constitutional: She is oriented to person, place, and time.       Morbidly obese woman, complains of frontal headache.  HENT:  Head: Normocephalic and atraumatic.  Right Ear: External ear normal.  Left Ear: External ear normal.  Mouth/Throat: Oropharynx is clear and moist.  Eyes: Conjunctivae and EOM are normal. Pupils are equal, round,  and reactive to light.  Neck: Normal range of motion. Neck supple. No JVD present.       No carotid bruit.  Cardiovascular: Normal rate, regular rhythm and normal heart sounds.   Pulmonary/Chest: Effort normal and breath sounds normal.  Abdominal: Soft. Bowel sounds are normal. She exhibits no distension. There is no tenderness.  Musculoskeletal: Normal range of motion. She exhibits no edema and no tenderness.  Neurological: She is alert and oriented to person, place, and time.       No sensory or motor deficit.  Skin: Skin is warm and dry.  Psychiatric: She has a normal mood and affect. Her behavior is normal.      ED Course  Procedures (including critical care time)   Labs Reviewed  PROTIME-INR  APTT  CBC  DIFFERENTIAL  COMPREHENSIVE METABOLIC PANEL  CK TOTAL AND CKMB  TROPONIN I  URINE RAPID DRUG SCREEN (HOSP PERFORMED)   3:28 PM Pt seen --> physical exam performed.  Lab workup ordered.  IV Reglan, Toradol and Benadryl ordered for headache.  Old charts reviewed in E-chart, shows prior CVA requiring TPA in past.     Date: 12/14/2011  Rate: 88  Rhythm: normal sinus rhythm  QRS Axis: normal  Intervals: normal  ST/T Wave abnormalities: normal  Conduction Disutrbances:none  Narrative Interpretation: Normal EKG  Old EKG Reviewed: none available  Results for orders placed during the hospital encounter of 12/14/11  PROTIME-INR      Component Value Range   Prothrombin Time 13.1  11.6 - 15.2 seconds   INR 0.97  0.00 - 1.49  APTT      Component Value Range   aPTT 29  24 - 37 seconds  CBC      Component Value Range   WBC 7.3  4.0 - 10.5 K/uL   RBC 4.45  3.87 - 5.11 MIL/uL   Hemoglobin 14.5  12.0 - 15.0 g/dL   HCT 29.5  28.4 - 13.2 %   MCV 97.3  78.0 - 100.0 fL   MCH 32.6  26.0 - 34.0 pg   MCHC 33.5  30.0 - 36.0 g/dL   RDW 44.0  10.2 - 72.5 %   Platelets 211  150 - 400 K/uL  DIFFERENTIAL      Component Value Range   Neutrophils Relative 68  43 - 77 %   Neutro Abs 4.9  1.7 - 7.7 K/uL   Lymphocytes Relative 24  12 - 46 %   Lymphs Abs 1.7  0.7 - 4.0 K/uL   Monocytes Relative 8  3 - 12 %   Monocytes Absolute 0.6  0.1 - 1.0 K/uL   Eosinophils Relative 1  0 - 5 %   Eosinophils Absolute 0.1  0.0 - 0.7 K/uL   Basophils Relative 0  0 - 1 %   Basophils Absolute 0.0  0.0 - 0.1 K/uL  COMPREHENSIVE METABOLIC PANEL      Component Value Range   Sodium 138  135 - 145 mEq/L   Potassium 4.2  3.5 - 5.1 mEq/L   Chloride 100  96 - 112 mEq/L   CO2 28  19 - 32 mEq/L   Glucose, Bld 126 (*) 70 - 99 mg/dL   BUN 9  6 - 23 mg/dL   Creatinine, Ser 3.66  0.50 - 1.10 mg/dL   Calcium 9.7  8.4 -  44.0 mg/dL   Total Protein 7.1  6.0 - 8.3 g/dL   Albumin 3.3 (*) 3.5 - 5.2 g/dL  AST 22  0 - 37 U/L   ALT 14  0 - 35 U/L   Alkaline Phosphatase 109  39 - 117 U/L   Total Bilirubin 0.3  0.3 - 1.2 mg/dL   GFR calc non Af Amer 63 (*) >90 mL/min   GFR calc Af Amer 73 (*) >90 mL/min  CK TOTAL AND CKMB      Component Value Range   Total CK 145  7 - 177 U/L   CK, MB 2.0  0.3 - 4.0 ng/mL   Relative Index 1.4  0.0 - 2.5  TROPONIN I      Component Value Range   Troponin I <0.30  <0.30 ng/mL  POCT I-STAT, CHEM 8      Component Value Range   Sodium 140  135 - 145 mEq/L   Potassium 4.1  3.5 - 5.1 mEq/L   Chloride 101  96 - 112 mEq/L   BUN 8  6 - 23 mg/dL   Creatinine, Ser 1.61  0.50 - 1.10 mg/dL   Glucose, Bld 096 (*) 70 - 99 mg/dL   Calcium, Ion 0.45  4.09 - 1.30 mmol/L   TCO2 27  0 - 100 mmol/L   Hemoglobin 15.6 (*) 12.0 - 15.0 g/dL   HCT 81.1  91.4 - 78.2 %   Ct Head Wo Contrast  12/14/2011  *RADIOLOGY REPORT*  Clinical Data: Headache.  Right-sided weakness.  CT HEAD WITHOUT CONTRAST  Technique:  Contiguous axial images were obtained from the base of the skull through the vertex without contrast.  Comparison: None.  Findings: The brain stem, cerebellum, cerebral peduncles, thalami, basal ganglia, basilar cisterns, and ventricular system appear unremarkable.  No intracranial hemorrhage, mass lesion, or acute infarction is identified.  Partially empty sella turcica noted.  IMPRESSION: 1.  Partially empty sella.  2.  No acute intracranial findings.   Original Report Authenticated By: Dellia Cloud, M.D.     Lab tests were negative.  Pt's headache is better.  Released home to followup with Dr. Romero Belling, her endocrinologist.    1. Headache        Carleene Cooper III, MD 12/14/11 [phone]

## 2011-12-14 NOTE — ED Notes (Signed)
EDP REPORTING NO NEED TO DO STROKE SWALLOW SCREEN. PER DR. DAVIDSON. THIS HAS BEEN CANCELLED

## 2011-12-14 NOTE — ED Notes (Signed)
Applied nasal canula per order.  Oxygen on at 2L. Pt. Reports feeling very cold.  States feet are "numb".  Upon assessment pt. Able to feel touch to BLE and able to flex/extend feet and toes.

## 2011-12-15 ENCOUNTER — Encounter (HOSPITAL_COMMUNITY): Payer: Self-pay | Admitting: Emergency Medicine

## 2011-12-21 ENCOUNTER — Ambulatory Visit (INDEPENDENT_AMBULATORY_CARE_PROVIDER_SITE_OTHER): Payer: Medicare Other | Admitting: Endocrinology

## 2011-12-21 VITALS — BP 128/80 | HR 97 | Temp 97.2°F | Resp 18 | Wt 372.0 lb

## 2011-12-21 DIAGNOSIS — R51 Headache: Secondary | ICD-10-CM

## 2011-12-21 DIAGNOSIS — I10 Essential (primary) hypertension: Secondary | ICD-10-CM

## 2011-12-21 DIAGNOSIS — E119 Type 2 diabetes mellitus without complications: Secondary | ICD-10-CM

## 2011-12-21 MED ORDER — INSULIN DETEMIR 100 UNIT/ML ~~LOC~~ SOLN: 650.0000 [IU] | SUBCUTANEOUS | Status: DC

## 2011-12-21 NOTE — Progress Notes (Signed)
Subjective:    Patient ID: [name], female    DOB: 1940/07/02, 71 y.o.   MRN: [id number]  HPI Pt returns for f/u of insulin-requiring DM (dx'ed 1997; complicated by PAD), characterized by severe insulin resistance.  no cbg record, but states cbg's vary from 150-300.  There is no trend throughout the day.  denies hypoglycemia Pt was seen in er last week for severe headache, and was found in er to have uti.  She was rx'ed abx. She takes bp meds as rx'ed.  Dizziness is much better.   Past Medical History  Diagnosis Date  . COLONIC POLYPS 09/13/2007  . DIABETES MELLITUS, TYPE I 10/29/2006  . HYPERCHOLESTEROLEMIA 02/25/2009  . HYPONATREMIA 04/22/2008  . HYPOKALEMIA 01/15/2008  . SMOKER 02/22/2008  . DEPRESSION 02/22/2008  . HYPERTENSION 12/20/2008  . 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 ALLERGIC RHINITIS 10/29/2006  . COPD 04/22/2009  . GERD 10/29/2006  . CIRRHOSIS 10/29/2006  . DEGENERATIVE JOINT DISEASE 06/15/2007  . KNEE PAIN, RIGHT 02/03/2010  . OSTEOPOROSIS 10/29/2006  . HYPERSOMNIA, ASSOCIATED WITH SLEEP APNEA 05/07/2009  . DYSPNEA 02/22/2008  . OTHER DYSPNEA AND RESPIRATORY ABNORMALITIES 04/23/2009  . CHEST PAIN 02/22/2008  . ASYMPTOMATIC POSTMENOPAUSAL STATUS 02/22/2008  . Gastroparesis   . Fatty liver   . Gastritis   . Diverticulosis   . Hyperplastic colon polyp   . OSA (obstructive sleep apnea)   . Cholelithiasis   . CHF (congestive heart failure)   . Stroke     mild stroke  . SEIZURE DISORDER 01/06/2010    pt not sure  . Headache   . Stroke   . Hypertension   . Diabetes mellitus   . Hyperlipidemia   . GERD (gastroesophageal reflux disease)   . MI (myocardial infarction)   . COPD (chronic obstructive pulmonary disease)   . Seizure   . Migraine     Past Surgical History  Procedure Date  . Appendectomy   . Abdominal hysterectomy 1980's  . (r) leg broken     when she was 18  . Catartact surgery   . Breast surgery 1986 or 1987    Breast  reduction  . Esophagogastroduodenoscopy 08/17/2011    Procedure: ESOPHAGOGASTRODUODENOSCOPY (EGD);  Surgeon: Hart Carwin, MD;  Location: [facility];  Service: Endoscopy;  Laterality: N/A;  . Colonoscopy 08/17/2011    Procedure: COLONOSCOPY;  Surgeon: Hart Carwin, MD;  Location: [facility];  Service: Endoscopy;  Laterality: N/A;    History   Social History  . Marital Status: Single    Spouse Name: N/A    Number of Children: 5  . Years of Education: N/A   Occupational History  . Retired    Social History Main Topics  . Smoking status: Not on file  . Smokeless tobacco: Not on file  . Alcohol Use:   . Drug Use:   . Sexually Active:    Other Topics Concern  . Not on file   Social History Narrative   ** Merged History Encounter **     Current Outpatient Prescriptions on File Prior to Visit  Medication Sig Dispense Refill  . albuterol (PROVENTIL HFA;VENTOLIN HFA) 108 (90 BASE) MCG/ACT inhaler Inhale 2 puffs into the lungs every morning. May take 2 puffs every 4-6 hours if needed for wheezing      . amLODipine (NORVASC) 2.5 MG tablet Take 2.5 mg by mouth daily.      [name] aspirin 81 MG tablet Take 81 mg by mouth daily.        [name]  budesonide-formoterol (SYMBICORT) 160-4.5 MCG/ACT inhaler Inhale 2 puffs into the lungs 2 (two) times daily.        [name] buPROPion (WELLBUTRIN XL) 300 MG 24 hr tablet Take 300 mg by mouth daily.      [name] buPROPion (WELLBUTRIN XL) 300 MG 24 hr tablet Take 300 mg by mouth daily.      . cephALEXin (KEFLEX) 250 MG capsule Take 250 mg by mouth 4 (four) times daily.      . furosemide (LASIX) 20 MG tablet TAKE 1 TABLET BY MOUTH EVERY DAY  90 tablet  2  . furosemide (LASIX) 20 MG tablet Take 20 mg by mouth daily.      . Hydrocodone-Acetaminophen 10-300 MG TABS Take 1 tablet by mouth every 6 (six) hours as needed.  20 each  0  . insulin detemir (LEVEMIR) 100 UNIT/ML injection Inject 650 Units into the skin every morning.  210 mL  11  . levETIRAcetam (KEPPRA) 500 MG  tablet Take 500 mg by mouth 2 (two) times daily.      [name] losartan-hydrochlorothiazide (HYZAAR) 100-25 MG per tablet Take 1 tablet by mouth daily.        . metoCLOPramide (REGLAN) 10 MG tablet Take 10 mg by mouth every 6 (six) hours as needed. For nausea/vomiting      . omeprazole (PRILOSEC) 40 MG capsule Take 40 mg by mouth daily.      [name] omeprazole (PRILOSEC) 40 MG capsule Take 40 mg by mouth daily.      [name] oxyCODONE-acetaminophen (PERCOCET) 10-325 MG per tablet Take 1 tablet by mouth every 4 (four) hours as needed. With maximum 4 tabs per day, For pain      . oxyCODONE-acetaminophen (PERCOCET) 10-325 MG per tablet Take 1 tablet by mouth every 4 (four) hours as needed. For pain      . potassium chloride SA (K-DUR,KLOR-CON) 20 MEQ tablet TAKE 1 TABLET BY MOUTH EVERY DAY WITH FOOD  90 tablet  2  . potassium chloride SA (K-DUR,KLOR-CON) 20 MEQ tablet Take 20 mEq by mouth daily.      . Probiotic Product (ALIGN) 4 MG CAPS Take 1 capsule by mouth daily.      . simvastatin (ZOCOR) 40 MG tablet Take 40 mg by mouth at bedtime.      . simvastatin (ZOCOR) 40 MG tablet Take 40 mg by mouth daily.      [name] triamcinolone cream (KENALOG) 0.1 % Apply 1 application topically 3 (three) times daily as needed. for itching      . zolpidem (AMBIEN) 5 MG tablet Take 5 mg by mouth at bedtime as needed. For sleep        Allergies  Allergen Reactions  . Lisinopril     REACTION: Cough  . Pioglitazone     REACTION: Edema  . Varenicline Tartrate     REACTION: bad dreams    Family History  Problem Relation Age of Onset  . Ovarian cancer Mother   . Diabetes Mother   . Diabetes Other   . Heart disease Mother   . Heart disease Father   . Heart disease Maternal Grandmother   . Heart disease Sister   . Colon cancer Mother   . Clotting disorder Sister     BP 128/80  Pulse 97  Temp 97.2 F (36.2 C) (Oral)  Resp 18  Wt 372 lb (168.738 kg)  SpO2 96%     Review of Systems Denies LOC and weight change.  Objective:   Physical Exam VITAL SIGNS:  See vs page.   GENERAL: no distress. Ext: trace bilat leg edema  Gait: slow but steady, with a cane.   PSYCH: Alert and oriented x 3.  Does not appear anxious nor depressed.       Assessment & Plan:  DM: needs increased rx HTN, with a situational component Headache, uncertain etiology

## 2011-12-21 NOTE — Patient Instructions (Addendum)
Increase the insulin to 650 units each morning.   Please come back for a follow-up appointment in 2 months.   check your blood sugar twice a day.  vary the time of day when you check, between before the 3 meals, and at bedtime.  also check if you have symptoms of your blood sugar being too high or too low.  please keep a record of the readings and bring it to your next appointment here.  please call us sooner if your blood sugar goes below 70, or if you have a lot of readings over 200.   Walking is good exercise.  Always use your cane.   Let's check a 24-hr urine, to see if your body is making too much adrenaline.  You will receive a letter with results.

## 2011-12-24 ENCOUNTER — Other Ambulatory Visit: Payer: Medicare Other

## 2011-12-24 DIAGNOSIS — I10 Essential (primary) hypertension: Secondary | ICD-10-CM

## 2011-12-25 DIAGNOSIS — Z0279 Encounter for issue of other medical certificate: Secondary | ICD-10-CM

## 2011-12-27 ENCOUNTER — Telehealth: Payer: Self-pay | Admitting: *Deleted

## 2011-12-27 NOTE — Telephone Encounter (Signed)
Called pt's son to let him know paperwork has been completed for FMLA. Placed upfront in cabinet, ready to be picked up.

## 2011-12-30 ENCOUNTER — Encounter: Payer: Self-pay | Admitting: Endocrinology

## 2011-12-30 LAB — METANEPHRINES, URINE, 24 HOUR: Metaneph Total, Ur: 226 mcg/24 h (ref 224–832)

## 2011-12-31 ENCOUNTER — Other Ambulatory Visit: Payer: Self-pay

## 2011-12-31 MED ORDER — OXYCODONE-ACETAMINOPHEN 10-325 MG PO TABS: 1.0000 | ORAL_TABLET | ORAL | Status: DC | PRN

## 2011-12-31 NOTE — Telephone Encounter (Signed)
Pt informed, Rx in cabinet for pt pick up  

## 2012-01-01 LAB — CATECHOLAMINES, FRACTIONATED, URINE, 24 HOUR
Calculated Total (E+NE): 61 mcg/24 h (ref 26–121)
Creatinine, Urine mg/day-CATEUR: 0.76 g/(24.h) (ref 0.63–2.50)
Dopamine, 24 hr Urine: 588 mcg/24 h — ABNORMAL HIGH (ref 52–480)

## 2012-02-04 ENCOUNTER — Telehealth: Payer: Self-pay | Admitting: Endocrinology

## 2012-02-04 ENCOUNTER — Other Ambulatory Visit: Payer: Self-pay | Admitting: General Practice

## 2012-02-04 ENCOUNTER — Other Ambulatory Visit: Payer: Self-pay | Admitting: Endocrinology

## 2012-02-04 MED ORDER — OXYCODONE-ACETAMINOPHEN 10-325 MG PO TABS: 1.0000 | ORAL_TABLET | ORAL | Status: DC | PRN

## 2012-02-04 NOTE — Telephone Encounter (Signed)
i printed 

## 2012-02-04 NOTE — Telephone Encounter (Signed)
Received message from Pt stating she needs refill of OXYCODONE. Last refilled on 9/13 pt last seen on 9/3.

## 2012-02-04 NOTE — Telephone Encounter (Signed)
Pt left message requesting refill of pain meds. She has 3 pills left.

## 2012-02-09 ENCOUNTER — Other Ambulatory Visit: Payer: Self-pay | Admitting: Endocrinology

## 2012-02-22 ENCOUNTER — Encounter: Payer: Self-pay | Admitting: Endocrinology

## 2012-02-22 ENCOUNTER — Ambulatory Visit (INDEPENDENT_AMBULATORY_CARE_PROVIDER_SITE_OTHER): Payer: Medicare Other | Admitting: Endocrinology

## 2012-02-22 VITALS — BP 138/80 | HR 77 | Temp 98.4°F | Wt 380.0 lb

## 2012-02-22 DIAGNOSIS — M79609 Pain in unspecified limb: Secondary | ICD-10-CM

## 2012-02-22 DIAGNOSIS — E1059 Type 1 diabetes mellitus with other circulatory complications: Secondary | ICD-10-CM

## 2012-02-22 DIAGNOSIS — E119 Type 2 diabetes mellitus without complications: Secondary | ICD-10-CM

## 2012-02-22 DIAGNOSIS — M79673 Pain in unspecified foot: Secondary | ICD-10-CM | POA: Insufficient documentation

## 2012-02-22 LAB — HEMOGLOBIN A1C
Hgb A1c MFr Bld: 8.3 % — ABNORMAL HIGH (ref <5.7)
Mean Plasma Glucose: 192 mg/dL — ABNORMAL HIGH (ref <117)

## 2012-02-22 MED ORDER — INSULIN DETEMIR 100 UNIT/ML ~~LOC~~ SOLN: 600.0000 [IU] | SUBCUTANEOUS | Status: DC

## 2012-02-22 NOTE — Patient Instructions (Addendum)
reduce the levemir to 600 units each morning.   Please come back for a follow-up appointment in 3 months.   check your blood sugar twice a day.  vary the time of day when you check, between before the 3 meals, and at bedtime.  also check if you have symptoms of your blood sugar being too high or too low.  please keep a record of the readings and bring it to your next appointment here.  please call us sooner if your blood sugar goes below 70, or if you have a lot of readings over 200.   blood tests are being requested for you today.  We'll contact you with results.

## 2012-02-22 NOTE — Progress Notes (Signed)
Subjective:    Patient ID: [name], female    DOB: 02-13-1941, 71 y.o.   MRN: [id number]  HPI Pt returns for f/u of insulin-requiring DM (dx'ed 1997; complicated by PAD; characterized by severe insulin resistance; therapy limited by noncompliance with cbg recording and morbid obesity).  no cbg record, but states cbg's vary from 150-300.  2 weeks ago, she called 911 because she thought her cbg was 12.  Paramedics told her she misread the meter, and that her cbg was only mildly low.  She had sxs of dizziness and hunger.  These resolved with drinking sugar water. She has 1 month of moderate pain at the feet, worst at the plantar aspects of the MTP's, but no assoc fever Past Medical History  Diagnosis Date  . COLONIC POLYPS 09/13/2007  . DIABETES MELLITUS, TYPE I 10/29/2006  . HYPERCHOLESTEROLEMIA 02/25/2009  . HYPONATREMIA 04/22/2008  . HYPOKALEMIA 01/15/2008  . SMOKER 02/22/2008  . DEPRESSION 02/22/2008  . HYPERTENSION 12/20/2008  . 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 ALLERGIC RHINITIS 10/29/2006  . COPD 04/22/2009  . GERD 10/29/2006  . CIRRHOSIS 10/29/2006  . DEGENERATIVE JOINT DISEASE 06/15/2007  . KNEE PAIN, RIGHT 02/03/2010  . OSTEOPOROSIS 10/29/2006  . HYPERSOMNIA, ASSOCIATED WITH SLEEP APNEA 05/07/2009  . DYSPNEA 02/22/2008  . OTHER DYSPNEA AND RESPIRATORY ABNORMALITIES 04/23/2009  . CHEST PAIN 02/22/2008  . ASYMPTOMATIC POSTMENOPAUSAL STATUS 02/22/2008  . Gastroparesis   . Fatty liver   . Gastritis   . Diverticulosis   . Hyperplastic colon polyp   . OSA (obstructive sleep apnea)   . Cholelithiasis   . CHF (congestive heart failure)   . Stroke     mild stroke  . SEIZURE DISORDER 01/06/2010    pt not sure  . Headache   . Stroke   . Hypertension   . Diabetes mellitus   . Hyperlipidemia   . GERD (gastroesophageal reflux disease)   . MI (myocardial infarction)   . COPD (chronic obstructive pulmonary disease)   . Seizure   . Migraine     Past  Surgical History  Procedure Date  . Appendectomy   . Abdominal hysterectomy 1980's  . (r) leg broken     when she was 18  . Catartact surgery   . Breast surgery 1986 or 1987    Breast reduction  . Esophagogastroduodenoscopy 08/17/2011    Procedure: ESOPHAGOGASTRODUODENOSCOPY (EGD);  Surgeon: Hart Carwin, MD;  Location: [facility];  Service: Endoscopy;  Laterality: N/A;  . Colonoscopy 08/17/2011    Procedure: COLONOSCOPY;  Surgeon: Hart Carwin, MD;  Location: [facility];  Service: Endoscopy;  Laterality: N/A;    History   Social History  . Marital Status: Single    Spouse Name: N/A    Number of Children: 5  . Years of Education: N/A   Occupational History  . Retired    Social History Main Topics  . Smoking status: Never Smoker   . Smokeless tobacco: Not on file  . Alcohol Use: Not on file  . Drug Use: Not on file  . Sexually Active: Not on file   Other Topics Concern  . Not on file   Social History Narrative   ** Merged History Encounter **     Current Outpatient Prescriptions on File Prior to Visit  Medication Sig Dispense Refill  . albuterol (PROVENTIL HFA;VENTOLIN HFA) 108 (90 BASE) MCG/ACT inhaler Inhale 2 puffs into the lungs every morning. May take 2 puffs every 4-6 hours if needed  for wheezing      . amLODipine (NORVASC) 2.5 MG tablet Take 2.5 mg by mouth daily.      [name] aspirin 81 MG tablet Take 81 mg by mouth daily.        . budesonide-formoterol (SYMBICORT) 160-4.5 MCG/ACT inhaler Inhale 2 puffs into the lungs 2 (two) times daily.        [name] buPROPion (WELLBUTRIN XL) 300 MG 24 hr tablet Take 300 mg by mouth daily.      [name] buPROPion (WELLBUTRIN XL) 300 MG 24 hr tablet Take 300 mg by mouth daily.      . cephALEXin (KEFLEX) 250 MG capsule Take 250 mg by mouth 4 (four) times daily.      . furosemide (LASIX) 20 MG tablet TAKE 1 TABLET BY MOUTH EVERY DAY  90 tablet  2  . furosemide (LASIX) 20 MG tablet Take 20 mg by mouth daily.      . Hydrocodone-Acetaminophen  10-300 MG TABS Take 1 tablet by mouth every 6 (six) hours as needed.  20 each  0  . levETIRAcetam (KEPPRA) 500 MG tablet Take 500 mg by mouth 2 (two) times daily.      [name] losartan-hydrochlorothiazide (HYZAAR) 100-25 MG per tablet Take 1 tablet by mouth daily.        . metoCLOPramide (REGLAN) 10 MG tablet Take 10 mg by mouth every 6 (six) hours as needed. For nausea/vomiting      . omeprazole (PRILOSEC) 40 MG capsule Take 40 mg by mouth daily.      [name] omeprazole (PRILOSEC) 40 MG capsule Take 40 mg by mouth daily.      [name] oxyCODONE-acetaminophen (PERCOCET) 10-325 MG per tablet Take 1 tablet by mouth every 4 (four) hours as needed. With maximum 4 tabs per day, For pain      . oxyCODONE-acetaminophen (PERCOCET) 10-325 MG per tablet Take 1 tablet by mouth every 4 (four) hours as needed. For pain  120 tablet  0  . potassium chloride SA (K-DUR,KLOR-CON) 20 MEQ tablet TAKE 1 TABLET BY MOUTH EVERY DAY WITH FOOD  90 tablet  2  . potassium chloride SA (K-DUR,KLOR-CON) 20 MEQ tablet Take 20 mEq by mouth daily.      . Probiotic Product (ALIGN) 4 MG CAPS Take 1 capsule by mouth daily.      . simvastatin (ZOCOR) 40 MG tablet Take 40 mg by mouth at bedtime.      . simvastatin (ZOCOR) 40 MG tablet Take 40 mg by mouth daily.      [name] triamcinolone cream (KENALOG) 0.1 % Apply 1 application topically 3 (three) times daily as needed. for itching      . TRUETRACK TEST test strip USE AS DIRECTED  300 each  2  . zolpidem (AMBIEN) 5 MG tablet Take 5 mg by mouth at bedtime as needed. For sleep        Allergies  Allergen Reactions  . Lisinopril     REACTION: Cough  . Pioglitazone     REACTION: Edema  . Varenicline Tartrate     REACTION: bad dreams    Family History  Problem Relation Age of Onset  . Ovarian cancer Mother   . Diabetes Mother   . Diabetes Other   . Heart disease Mother   . Heart disease Father   . Heart disease Maternal Grandmother   . Heart disease Sister   . Colon cancer Mother   . Clotting  disorder Sister     BP 138/80  Pulse  77  Temp 98.4 F (36.9 C) (Oral)  Wt 380 lb (172.367 kg)  SpO2 97%  Review of Systems Denies LOC, but she has numbness of the feet.    Objective:   Physical Exam VITAL SIGNS:  See vs page GENERAL: no distress.  Morbid obesity.   Pulses: dorsalis pedis intact bilat.   Feet: no deformity.  no ulcer on the feet.  feet are of normal color and temp.  2+ bilat leg edema Neuro: sensation is intact to touch on the feet.    Lab Results  Component Value Date   HGBA1C 8.3* 02/22/2012      Assessment & Plan:  DM, inadeq control for reasons noted above.  Apparently overcontrolled now. Goal a1c of 7 unlikely here. Foot pain, new, uncertain etiology.  See podiatry if persists

## 2012-02-23 NOTE — Progress Notes (Signed)
Pt advised and states an understanding of results per dr. Everardo All

## 2012-03-13 ENCOUNTER — Other Ambulatory Visit: Payer: Self-pay | Admitting: Endocrinology

## 2012-03-13 ENCOUNTER — Other Ambulatory Visit: Payer: Self-pay | Admitting: Internal Medicine

## 2012-03-13 ENCOUNTER — Other Ambulatory Visit: Payer: Self-pay | Admitting: *Deleted

## 2012-03-13 DIAGNOSIS — M1990 Unspecified osteoarthritis, unspecified site: Secondary | ICD-10-CM

## 2012-03-13 MED ORDER — OXYCODONE-ACETAMINOPHEN 10-325 MG PO TABS: 1.0000 | ORAL_TABLET | ORAL | Status: DC | PRN

## 2012-03-13 NOTE — Telephone Encounter (Signed)
Rx PRINTED FOR DOCTOR GHERGHE TO SIGN. PATIENT NOTIFIED TO COME AND PICK UP Rx FOR OXYCONDONE-ACETAMINOPHEN.

## 2012-03-13 NOTE — Telephone Encounter (Signed)
Patient needs refill of pain pills sent to [facility] on [address].

## 2012-03-13 NOTE — Telephone Encounter (Signed)
Medication refill for oxycodone- acetaminophen called to [facility].

## 2012-03-13 NOTE — Telephone Encounter (Signed)
Fannie Knee, can you enter the medication (just check the reorder button, same nr. Tablets and refills as before)? I will then approve it and you can call it in. I tried to do this, but it opens a regular visit note for me - I let the IT people know.  Thank you!

## 2012-04-24 ENCOUNTER — Other Ambulatory Visit: Payer: Self-pay | Admitting: *Deleted

## 2012-04-24 DIAGNOSIS — M1990 Unspecified osteoarthritis, unspecified site: Secondary | ICD-10-CM

## 2012-04-24 MED ORDER — OXYCODONE-ACETAMINOPHEN 10-325 MG PO TABS: 1.0000 | ORAL_TABLET | ORAL | Status: DC | PRN

## 2012-04-24 NOTE — Telephone Encounter (Signed)
i printed 

## 2012-04-25 NOTE — Telephone Encounter (Signed)
Pt. advised rx ready for pick up 

## 2012-05-03 ENCOUNTER — Telehealth: Payer: Self-pay | Admitting: Endocrinology

## 2012-05-03 NOTE — Telephone Encounter (Signed)
Pt's son advised form placard was mailed to pt

## 2012-05-03 NOTE — Telephone Encounter (Signed)
The patient's son came by office to pick up handicap placard form that was dropped off last week.  This writer could not locate the completed form at front, is it still being completed?  Please call the patient's son at [phone] when ready for pickup.

## 2012-05-15 ENCOUNTER — Telehealth: Payer: Self-pay | Admitting: Neurology

## 2012-05-15 NOTE — Telephone Encounter (Signed)
Called and spoke with the patient. Informed her that we had received correspondence from [facility] re: Levemir Flexpen and a quantity limit. The patient states she is not having trouble getting her Levemir. I asked her as requested by Dr. Everardo All if she wanted him to prescribe a cheaper insulin and she responded by saying she wasn't even sure how much it was. She reports that her son picks it up for her. I asked her that she check with her son re: cost and if she wanted the doctor to prescribe something less expensive to give [place] a call at the office. She states she will.

## 2012-05-16 ENCOUNTER — Telehealth: Payer: Self-pay

## 2012-05-16 NOTE — Telephone Encounter (Signed)
Pt called back to let you know that the insulin rx you gave her will cost $48, she would like rx changed to different rx

## 2012-05-16 NOTE — Telephone Encounter (Signed)
Pt advised and states an undestanding

## 2012-05-16 NOTE — Telephone Encounter (Signed)
please call patient: Ask your pharmacist what would be cheaper and let me know

## 2012-05-24 ENCOUNTER — Encounter: Payer: Self-pay | Admitting: Endocrinology

## 2012-05-24 ENCOUNTER — Ambulatory Visit: Payer: Medicare Other | Admitting: Endocrinology

## 2012-05-24 ENCOUNTER — Ambulatory Visit (INDEPENDENT_AMBULATORY_CARE_PROVIDER_SITE_OTHER): Payer: Medicare Other | Admitting: Endocrinology

## 2012-05-24 VITALS — BP 142/82 | HR 86 | Wt 386.0 lb

## 2012-05-24 DIAGNOSIS — M1990 Unspecified osteoarthritis, unspecified site: Secondary | ICD-10-CM

## 2012-05-24 DIAGNOSIS — E119 Type 2 diabetes mellitus without complications: Secondary | ICD-10-CM

## 2012-05-24 MED ORDER — OXYCODONE-ACETAMINOPHEN 10-325 MG PO TABS: 1.0000 | ORAL_TABLET | ORAL | Status: DC | PRN

## 2012-05-24 NOTE — Patient Instructions (Addendum)
blood tests are being requested for you today.  We'll contact you with results. Please come back for a follow-up appointment in 3 months.   check your blood sugar twice a day.  vary the time of day when you check, between before the 3 meals, and at bedtime.  also check if you have symptoms of your blood sugar being too high or too low.  please keep a record of the readings and bring it to your next appointment here.  please call us sooner if your blood sugar goes below 70, or if you have a lot of readings over 200.  

## 2012-05-24 NOTE — Progress Notes (Signed)
Subjective:    Patient ID: [name], female    DOB: Mar 21, 1941, 72 y.o.   MRN: [id number]  HPI Pt returns for f/u of insulin-requiring DM (dx'ed 1997; complicated by PAD; characterized by severe insulin resistance; therapy limited by noncompliance with cbg recording and morbid obesity; she was on humalog 75/25 as recently as 2011, but she has done better with a simple qd insulin regimen).   no cbg record, but states cbg's vary from 50's-300's.  She says there is no trend throughout the day.   Past Medical History  Diagnosis Date  . COLONIC POLYPS 09/13/2007  . DIABETES MELLITUS, TYPE I 10/29/2006  . HYPERCHOLESTEROLEMIA 02/25/2009  . HYPONATREMIA 04/22/2008  . HYPOKALEMIA 01/15/2008  . SMOKER 02/22/2008  . DEPRESSION 02/22/2008  . HYPERTENSION 12/20/2008  . 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 ALLERGIC RHINITIS 10/29/2006  . COPD 04/22/2009  . GERD 10/29/2006  . CIRRHOSIS 10/29/2006  . DEGENERATIVE JOINT DISEASE 06/15/2007  . KNEE PAIN, RIGHT 02/03/2010  . OSTEOPOROSIS 10/29/2006  . HYPERSOMNIA, ASSOCIATED WITH SLEEP APNEA 05/07/2009  . DYSPNEA 02/22/2008  . OTHER DYSPNEA AND RESPIRATORY ABNORMALITIES 04/23/2009  . CHEST PAIN 02/22/2008  . ASYMPTOMATIC POSTMENOPAUSAL STATUS 02/22/2008  . Gastroparesis   . Fatty liver   . Gastritis   . Diverticulosis   . Hyperplastic colon polyp   . OSA (obstructive sleep apnea)   . Cholelithiasis   . CHF (congestive heart failure)   . Stroke     mild stroke  . SEIZURE DISORDER 01/06/2010    pt not sure  . Headache   . Stroke   . Hypertension   . Diabetes mellitus   . Hyperlipidemia   . GERD (gastroesophageal reflux disease)   . MI (myocardial infarction)   . COPD (chronic obstructive pulmonary disease)   . Seizure   . Migraine     Past Surgical History  Procedure Date  . Appendectomy   . Abdominal hysterectomy 1980's  . (r) leg broken     when she was 18  . Catartact surgery   . Breast surgery 1986 or 1987     Breast reduction  . Esophagogastroduodenoscopy 08/17/2011    Procedure: ESOPHAGOGASTRODUODENOSCOPY (EGD);  Surgeon: Hart Carwin, MD;  Location: [facility];  Service: Endoscopy;  Laterality: N/A;  . Colonoscopy 08/17/2011    Procedure: COLONOSCOPY;  Surgeon: Hart Carwin, MD;  Location: [facility];  Service: Endoscopy;  Laterality: N/A;    History   Social History  . Marital Status: Single    Spouse Name: N/A    Number of Children: 5  . Years of Education: N/A   Occupational History  . Retired    Social History Main Topics  . Smoking status: Never Smoker   . Smokeless tobacco: Not on file  . Alcohol Use: Not on file  . Drug Use: Not on file  . Sexually Active: Not on file   Other Topics Concern  . Not on file   Social History Narrative   ** Merged History Encounter **     Current Outpatient Prescriptions on File Prior to Visit  Medication Sig Dispense Refill  . albuterol (PROVENTIL HFA;VENTOLIN HFA) 108 (90 BASE) MCG/ACT inhaler Inhale 2 puffs into the lungs every morning. May take 2 puffs every 4-6 hours if needed for wheezing      . amLODipine (NORVASC) 2.5 MG tablet Take 2.5 mg by mouth daily.      [name] aspirin 81 MG tablet Take 81 mg by mouth  daily.        . budesonide-formoterol (SYMBICORT) 160-4.5 MCG/ACT inhaler Inhale 2 puffs into the lungs 2 (two) times daily.        [name] buPROPion (WELLBUTRIN XL) 300 MG 24 hr tablet Take 300 mg by mouth daily.      [name] buPROPion (WELLBUTRIN XL) 300 MG 24 hr tablet Take 300 mg by mouth daily.      . cephALEXin (KEFLEX) 250 MG capsule Take 250 mg by mouth 4 (four) times daily.      . furosemide (LASIX) 20 MG tablet TAKE 1 TABLET BY MOUTH EVERY DAY  90 tablet  2  . furosemide (LASIX) 20 MG tablet Take 20 mg by mouth daily.      . Hydrocodone-Acetaminophen 10-300 MG TABS Take 1 tablet by mouth every 6 (six) hours as needed.  20 each  0  . insulin detemir (LEVEMIR FLEXPEN) 100 UNIT/ML injection Inject 600 Units into the skin every  morning. And pen needles, 5/day  210 mL  12  . levETIRAcetam (KEPPRA) 500 MG tablet Take 500 mg by mouth 2 (two) times daily.      [name] losartan-hydrochlorothiazide (HYZAAR) 100-25 MG per tablet Take 1 tablet by mouth daily.        . metoCLOPramide (REGLAN) 10 MG tablet Take 10 mg by mouth every 6 (six) hours as needed. For nausea/vomiting      . omeprazole (PRILOSEC) 40 MG capsule Take 40 mg by mouth daily.      [name] omeprazole (PRILOSEC) 40 MG capsule Take 40 mg by mouth daily.      [name] oxyCODONE-acetaminophen (PERCOCET) 10-325 MG per tablet Take 1 tablet by mouth every 4 (four) hours as needed. With maximum 4 tabs per day, For pain      . potassium chloride SA (K-DUR,KLOR-CON) 20 MEQ tablet TAKE 1 TABLET BY MOUTH EVERY DAY WITH FOOD  90 tablet  2  . potassium chloride SA (K-DUR,KLOR-CON) 20 MEQ tablet Take 20 mEq by mouth daily.      . Probiotic Product (ALIGN) 4 MG CAPS Take 1 capsule by mouth daily.      . simvastatin (ZOCOR) 40 MG tablet Take 40 mg by mouth at bedtime.      . simvastatin (ZOCOR) 40 MG tablet Take 40 mg by mouth daily.      [name] triamcinolone cream (KENALOG) 0.1 % Apply 1 application topically 3 (three) times daily as needed. for itching      . TRUETRACK TEST test strip USE AS DIRECTED  300 each  2  . zolpidem (AMBIEN) 5 MG tablet Take 5 mg by mouth at bedtime as needed. For sleep        Allergies  Allergen Reactions  . Lisinopril     REACTION: Cough  . Pioglitazone     REACTION: Edema  . Varenicline Tartrate     REACTION: bad dreams    Family History  Problem Relation Age of Onset  . Ovarian cancer Mother   . Diabetes Mother   . Diabetes Other   . Heart disease Mother   . Heart disease Father   . Heart disease Maternal Grandmother   . Heart disease Sister   . Colon cancer Mother   . Clotting disorder Sister     BP 142/82  Pulse 86  Wt 386 lb (175.088 kg)  SpO2 97%   Review of Systems Denies LOC    Objective:   Physical Exam VITAL SIGNS:  See vs  page GENERAL: no distress.  Morbid obesity.  In wheelchair.   SKIN:  Insulin injection sites at the anterior abdomen are normal      Assessment & Plan:  DM: therapy limited by noncompliance, and her need for a simple regimen.  i'll do the best i can.  this may be the best control she can achieve

## 2012-06-08 ENCOUNTER — Other Ambulatory Visit: Payer: Self-pay

## 2012-06-08 MED ORDER — INSULIN DETEMIR 100 UNIT/ML ~~LOC~~ SOLN: 600.0000 [IU] | SUBCUTANEOUS | Status: DC

## 2012-06-13 ENCOUNTER — Other Ambulatory Visit: Payer: Self-pay | Admitting: Endocrinology

## 2012-06-13 ENCOUNTER — Telehealth: Payer: Self-pay | Admitting: *Deleted

## 2012-06-13 NOTE — Telephone Encounter (Signed)
Forms faxed to [facility] Prior Authorization for patient medication Levemir because of the quanity needed pharmacy review to be approved. [name]

## 2012-06-16 ENCOUNTER — Telehealth: Payer: Self-pay | Admitting: *Deleted

## 2012-06-16 NOTE — Telephone Encounter (Signed)
PRIOR AUTHORIZATION APPROVED FOR LEVEMIR FLEX PEN.

## 2012-06-26 ENCOUNTER — Emergency Department (HOSPITAL_COMMUNITY): Payer: Medicare PPO

## 2012-06-26 ENCOUNTER — Emergency Department (HOSPITAL_COMMUNITY)
Admission: EM | Admit: 2012-06-26 | Discharge: 2012-06-27 | Disposition: A | Discharge status: Home / Self Care | Payer: Medicare PPO | Attending: Emergency Medicine | Admitting: Emergency Medicine

## 2012-06-26 ENCOUNTER — Encounter (HOSPITAL_COMMUNITY): Payer: Self-pay | Admitting: *Deleted

## 2012-06-26 DIAGNOSIS — Z860100 Personal history of colon polyps, unspecified: Secondary | ICD-10-CM | POA: Insufficient documentation

## 2012-06-26 DIAGNOSIS — E109 Type 1 diabetes mellitus without complications: Secondary | ICD-10-CM | POA: Insufficient documentation

## 2012-06-26 DIAGNOSIS — I509 Heart failure, unspecified: Secondary | ICD-10-CM | POA: Insufficient documentation

## 2012-06-26 DIAGNOSIS — Z8673 Personal history of transient ischemic attack (TIA), and cerebral infarction without residual deficits: Secondary | ICD-10-CM | POA: Insufficient documentation

## 2012-06-26 DIAGNOSIS — Z8639 Personal history of other endocrine, nutritional and metabolic disease: Secondary | ICD-10-CM | POA: Insufficient documentation

## 2012-06-26 DIAGNOSIS — Z8679 Personal history of other diseases of the circulatory system: Secondary | ICD-10-CM | POA: Insufficient documentation

## 2012-06-26 DIAGNOSIS — I252 Old myocardial infarction: Secondary | ICD-10-CM | POA: Insufficient documentation

## 2012-06-26 DIAGNOSIS — E7800 Pure hypercholesterolemia, unspecified: Secondary | ICD-10-CM | POA: Insufficient documentation

## 2012-06-26 DIAGNOSIS — K219 Gastro-esophageal reflux disease without esophagitis: Secondary | ICD-10-CM | POA: Insufficient documentation

## 2012-06-26 DIAGNOSIS — Z794 Long term (current) use of insulin: Secondary | ICD-10-CM | POA: Insufficient documentation

## 2012-06-26 DIAGNOSIS — Z87442 Personal history of urinary calculi: Secondary | ICD-10-CM | POA: Insufficient documentation

## 2012-06-26 DIAGNOSIS — G40909 Epilepsy, unspecified, not intractable, without status epilepticus: Secondary | ICD-10-CM | POA: Insufficient documentation

## 2012-06-26 DIAGNOSIS — I10 Essential (primary) hypertension: Secondary | ICD-10-CM | POA: Insufficient documentation

## 2012-06-26 DIAGNOSIS — J4489 Other specified chronic obstructive pulmonary disease: Secondary | ICD-10-CM | POA: Insufficient documentation

## 2012-06-26 DIAGNOSIS — Z862 Personal history of diseases of the blood and blood-forming organs and certain disorders involving the immune mechanism: Secondary | ICD-10-CM | POA: Insufficient documentation

## 2012-06-26 DIAGNOSIS — F17200 Nicotine dependence, unspecified, uncomplicated: Secondary | ICD-10-CM | POA: Insufficient documentation

## 2012-06-26 DIAGNOSIS — F3289 Other specified depressive episodes: Secondary | ICD-10-CM | POA: Insufficient documentation

## 2012-06-26 DIAGNOSIS — Z8739 Personal history of other diseases of the musculoskeletal system and connective tissue: Secondary | ICD-10-CM | POA: Insufficient documentation

## 2012-06-26 DIAGNOSIS — Z7982 Long term (current) use of aspirin: Secondary | ICD-10-CM | POA: Insufficient documentation

## 2012-06-26 DIAGNOSIS — R079 Chest pain, unspecified: Secondary | ICD-10-CM | POA: Insufficient documentation

## 2012-06-26 DIAGNOSIS — R609 Edema, unspecified: Secondary | ICD-10-CM | POA: Insufficient documentation

## 2012-06-26 DIAGNOSIS — Z79899 Other long term (current) drug therapy: Secondary | ICD-10-CM | POA: Insufficient documentation

## 2012-06-26 DIAGNOSIS — R0602 Shortness of breath: Secondary | ICD-10-CM | POA: Insufficient documentation

## 2012-06-26 LAB — CBC
HCT: 42.6 % (ref 36.0–46.0)
MCH: 32.2 pg (ref 26.0–34.0)
MCV: 97.3 fL (ref 78.0–100.0)
Platelets: 202 10*3/uL (ref 150–400)
RDW: 13 % (ref 11.5–15.5)

## 2012-06-26 LAB — BASIC METABOLIC PANEL
BUN: 13 mg/dL (ref 6–23)
Calcium: 9.3 mg/dL (ref 8.4–10.5)
Creatinine, Ser: 0.97 mg/dL (ref 0.50–1.10)
GFR calc Af Amer: 67 mL/min — ABNORMAL LOW (ref >90)

## 2012-06-26 LAB — PRO B NATRIURETIC PEPTIDE: Pro B Natriuretic peptide (BNP): 107.4 pg/mL (ref 0–125)

## 2012-06-27 LAB — POCT I-STAT TROPONIN I: Troponin i, poc: 0 ng/mL (ref 0.00–0.08)

## 2012-06-27 LAB — GLUCOSE, CAPILLARY

## 2012-06-27 MED ORDER — FUROSEMIDE 40 MG PO TABS
40.0000 mg | ORAL_TABLET | Freq: Once | ORAL | Status: AC
  Administered 2012-06-27: 40 mg via ORAL
  Filled 2012-06-27: qty 1

## 2012-06-27 MED ORDER — ALBUTEROL SULFATE (5 MG/ML) 0.5% IN NEBU
5.0000 mg | INHALATION_SOLUTION | Freq: Once | RESPIRATORY_TRACT | Status: AC
  Administered 2012-06-27: 5 mg via RESPIRATORY_TRACT
  Filled 2012-06-27: qty 1
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
## 2012-06-27 NOTE — ED Notes (Signed)
Pt stated her son was picking her up and waited in the waiting room for him.

## 2012-06-28 ENCOUNTER — Encounter: Payer: Self-pay | Admitting: Endocrinology

## 2012-06-28 ENCOUNTER — Ambulatory Visit (INDEPENDENT_AMBULATORY_CARE_PROVIDER_SITE_OTHER): Payer: Medicare PPO | Admitting: Endocrinology

## 2012-06-28 VITALS — BP 132/78 | HR 68 | Wt 387.0 lb

## 2012-06-28 DIAGNOSIS — M545 Low back pain: Secondary | ICD-10-CM

## 2012-06-28 LAB — D-DIMER, QUANTITATIVE: D-Dimer, Quant: 0.64 ug/mL-FEU — ABNORMAL HIGH (ref 0.00–0.48)

## 2012-06-28 MED ORDER — FUROSEMIDE 40 MG PO TABS: 40.0000 mg | ORAL_TABLET | Freq: Every day | ORAL | Status: DC

## 2012-06-28 MED ORDER — OXYCODONE HCL 10 MG PO TB12: 10.0000 mg | ORAL_TABLET | Freq: Two times a day (BID) | ORAL | Status: DC

## 2012-06-28 NOTE — Patient Instructions (Addendum)
Let's check a special type of heart x-ray.  you will receive a phone call, about a day and time for an appointment. blood tests are being requested for you today.  We'll contact you with results. Please increase the furosemide.  i have sent a prescription to your pharmacy.   Here is a pill to take twice a day, to help with the pain.  You can also take percocet along with it.   Please come back for a follow-up appointment in 3 weeks.

## 2012-06-28 NOTE — Progress Notes (Signed)
Subjective:    Patient ID: [name], female    DOB: 1940-08-14, 72 y.o.   MRN: [id number]  HPI Pt was seen recently in ER for chest pain, and it intermittently persists.    Pt states few mos of moderate swelling of the legs, and assoc sob.  Past Medical History  Diagnosis Date  . COLONIC POLYPS 09/13/2007  . DIABETES MELLITUS, TYPE I 10/29/2006  . HYPERCHOLESTEROLEMIA 02/25/2009  . HYPONATREMIA 04/22/2008  . HYPOKALEMIA 01/15/2008  . SMOKER 02/22/2008  . DEPRESSION 02/22/2008  . HYPERTENSION 12/20/2008  . 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 ALLERGIC RHINITIS 10/29/2006  . COPD 04/22/2009  . GERD 10/29/2006  . CIRRHOSIS 10/29/2006  . DEGENERATIVE JOINT DISEASE 06/15/2007  . KNEE PAIN, RIGHT 02/03/2010  . OSTEOPOROSIS 10/29/2006  . HYPERSOMNIA, ASSOCIATED WITH SLEEP APNEA 05/07/2009  . DYSPNEA 02/22/2008  . OTHER DYSPNEA AND RESPIRATORY ABNORMALITIES 04/23/2009  . CHEST PAIN 02/22/2008  . ASYMPTOMATIC POSTMENOPAUSAL STATUS 02/22/2008  . Gastroparesis   . Fatty liver   . Gastritis   . Diverticulosis   . Hyperplastic colon polyp   . OSA (obstructive sleep apnea)   . Cholelithiasis   . CHF (congestive heart failure)   . Stroke     mild stroke  . SEIZURE DISORDER 01/06/2010    pt not sure  . Headache   . Stroke   . Hypertension   . Diabetes mellitus   . Hyperlipidemia   . GERD (gastroesophageal reflux disease)   . MI (myocardial infarction)   . COPD (chronic obstructive pulmonary disease)   . Seizure   . Migraine     Past Surgical History  Procedure Laterality Date  . Appendectomy    . Abdominal hysterectomy  1980's  . (r) leg broken      when she was 18  . Catartact surgery    . Breast surgery  1986 or 1987    Breast reduction  . Esophagogastroduodenoscopy  08/17/2011    Procedure: ESOPHAGOGASTRODUODENOSCOPY (EGD);  Surgeon: Hart Carwin, MD;  Location: [facility];  Service: Endoscopy;  Laterality: N/A;  . Colonoscopy  08/17/2011   Procedure: COLONOSCOPY;  Surgeon: Hart Carwin, MD;  Location: [facility];  Service: Endoscopy;  Laterality: N/A;    History   Social History  . Marital Status: Single    Spouse Name: N/A    Number of Children: 5  . Years of Education: N/A   Occupational History  . Retired    Social History Main Topics  . Smoking status: Heavy Tobacco Smoker -- 0.25 packs/day  . Smokeless tobacco: Not on file  . Alcohol Use: No  . Drug Use: No  . Sexually Active: Not on file   Other Topics Concern  . Not on file   Social History Narrative   ** Merged History Encounter **        Current Outpatient Prescriptions on File Prior to Visit  Medication Sig Dispense Refill  . albuterol (PROVENTIL HFA;VENTOLIN HFA) 108 (90 BASE) MCG/ACT inhaler Inhale 2 puffs into the lungs every morning. May take 2 puffs every 4-6 hours if needed for wheezing      . amLODipine (NORVASC) 2.5 MG tablet Take 2.5 mg by mouth daily.      [name] aspirin 81 MG tablet Take 81 mg by mouth daily.        . budesonide-formoterol (SYMBICORT) 160-4.5 MCG/ACT inhaler Inhale 2 puffs into the lungs 2 (two) times daily.        [name]  buPROPion (WELLBUTRIN XL) 300 MG 24 hr tablet Take 300 mg by mouth daily.      . insulin detemir (LEVEMIR FLEXPEN) 100 UNIT/ML injection Inject 600 Units into the skin every morning. And pen needles, 5/day  210 mL  12  . levETIRAcetam (KEPPRA) 500 MG tablet Take 500 mg by mouth 2 (two) times daily.      [name] losartan-hydrochlorothiazide (HYZAAR) 100-25 MG per tablet Take 1 tablet by mouth daily.        . metoCLOPramide (REGLAN) 10 MG tablet Take 10 mg by mouth every 6 (six) hours as needed. For nausea/vomiting      . omeprazole (PRILOSEC) 40 MG capsule Take 40 mg by mouth daily.      [name] oxyCODONE-acetaminophen (PERCOCET) 10-325 MG per tablet Take 1 tablet by mouth every 4 (four) hours as needed. For pain  120 tablet  0  . potassium chloride SA (K-DUR,KLOR-CON) 20 MEQ tablet Take 20 mEq by mouth daily.      .  Probiotic Product (ALIGN) 4 MG CAPS Take 1 capsule by mouth daily.      . simvastatin (ZOCOR) 40 MG tablet Take 40 mg by mouth at bedtime.      . triamcinolone cream (KENALOG) 0.1 % Apply 1 application topically 3 (three) times daily as needed. for itching      . zolpidem (AMBIEN) 5 MG tablet Take 5 mg by mouth at bedtime as needed. For sleep       No current facility-administered medications on file prior to visit.    Allergies  Allergen Reactions  . Lisinopril     REACTION: Cough  . Pioglitazone     REACTION: Edema  . Varenicline Tartrate     REACTION: bad dreams    Family History  Problem Relation Age of Onset  . Ovarian cancer Mother   . Diabetes Mother   . Diabetes Other   . Heart disease Mother   . Heart disease Father   . Heart disease Maternal Grandmother   . Heart disease Sister   . Colon cancer Mother   . Clotting disorder Sister    BP 132/78  Pulse 68  Wt 387 lb (175.542 kg)  BMI 60.6 kg/m2  SpO2 94%  Review of Systems Denies weight change.  No change in chronic depression.  Low-back pain is not well-controlled    Objective:   Physical Exam VITAL SIGNS:  See vs page GENERAL: no distress.  Morbid obesity.  In wheelchair. LUNGS:  Clear to auscultation. Ext: 1+ bilat leg edema.      Assessment & Plan:  Edema, worse Chest pain, persistent, uncertain etiology. Low-back pain, uncertain etiology.

## 2012-06-29 ENCOUNTER — Ambulatory Visit (INDEPENDENT_AMBULATORY_CARE_PROVIDER_SITE_OTHER)
Admission: RE | Admit: 2012-06-29 | Discharge: 2012-06-29 | Disposition: A | Discharge status: Home / Self Care | Payer: Medicare PPO | Source: Ambulatory Visit | Attending: Endocrinology | Admitting: Endocrinology

## 2012-06-29 DIAGNOSIS — R079 Chest pain, unspecified: Secondary | ICD-10-CM

## 2012-06-29 DIAGNOSIS — R0602 Shortness of breath: Secondary | ICD-10-CM

## 2012-06-29 MED ORDER — IOHEXOL 350 MG/ML SOLN
100.0000 mL | Freq: Once | INTRAVENOUS | Status: AC | PRN
  Administered 2012-06-29: 100 mL via INTRAVENOUS

## 2012-07-05 ENCOUNTER — Telehealth: Payer: Self-pay | Admitting: Endocrinology

## 2012-07-05 NOTE — Telephone Encounter (Signed)
Is there another place that can do the test?

## 2012-07-05 NOTE — Telephone Encounter (Signed)
[name] w/ [facility] [phone], pt to have nuclear study. Pt is over the wt limit. Please call [name] and let her know what to do.

## 2012-07-06 ENCOUNTER — Encounter (HOSPITAL_COMMUNITY): Payer: Medicare PPO

## 2012-07-16 ENCOUNTER — Other Ambulatory Visit: Payer: Self-pay | Admitting: Endocrinology

## 2012-07-17 ENCOUNTER — Ambulatory Visit (INDEPENDENT_AMBULATORY_CARE_PROVIDER_SITE_OTHER): Payer: Medicare PPO | Admitting: Endocrinology

## 2012-07-17 ENCOUNTER — Other Ambulatory Visit: Payer: Self-pay

## 2012-07-17 VITALS — BP 136/80 | HR 84 | Wt 383.0 lb

## 2012-07-17 DIAGNOSIS — R079 Chest pain, unspecified: Secondary | ICD-10-CM

## 2012-07-17 MED ORDER — NITROGLYCERIN 0.4 MG SL SUBL: 0.4000 mg | SUBLINGUAL_TABLET | SUBLINGUAL | Status: DC | PRN

## 2012-07-17 MED ORDER — OMEPRAZOLE 40 MG PO CPDR: 40.0000 mg | DELAYED_RELEASE_CAPSULE | Freq: Every day | ORAL | Status: DC

## 2012-07-17 MED ORDER — INSULIN GLARGINE 100 UNIT/ML ~~LOC~~ SOLN: 300.0000 [IU] | Freq: Every day | SUBCUTANEOUS | Status: DC

## 2012-07-17 NOTE — Patient Instructions (Addendum)
Change the insulin to lantus.  i have sent a prescription to your pharmacy.  Please come back for a follow-up appointment in 1 month.  Please have the prescription for "oxycodone" filled.   Refer to a heart specialist.  you will receive a phone call, about a day and time for an appointment.   i have sent a prescription to your pharmacy, for "nitroglycerin" pills.  Take 1 every 5 minutes as needed for chest pain.  If the 3rd one does not help, call 911.

## 2012-07-17 NOTE — Progress Notes (Signed)
Subjective:    Patient ID: [name], female    DOB: [birth year], 72 y.o.   MRN: [id number]  HPI The state of at least three ongoing medical problems is addressed today, with interval history of each noted here: Pt returns for f/u of insulin-requiring DM (dx'ed 1997; complicated by PAD; characterized by severe insulin resistance; therapy limited by noncompliance with cbg recording and morbid obesity; she was on humalog 75/25 as recently as 2011, but she has done better with a simple qd insulin regimen; she last had severe hypoglycemia in approx 2011).   no cbg record, but states cbg's vary from 100s-300's.  She says there is no trend throughout the day.  Low-back pain persists.  It is improved but not well-controlled.  i checked the [facility] web site, and the rx for oxycontin has not been filled.   Edema persists.  i am advised by cardiol that they are unaware of any testing center that can do a cardiac nuclear study in a patient of this size.  Past Medical History  Diagnosis Date  . COLONIC POLYPS 09/13/2007  . DIABETES MELLITUS, TYPE I 10/29/2006  . HYPERCHOLESTEROLEMIA 02/25/2009  . HYPONATREMIA 04/22/2008  . HYPOKALEMIA 01/15/2008  . SMOKER 02/22/2008  . DEPRESSION 02/22/2008  . HYPERTENSION 12/20/2008  . 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 ALLERGIC RHINITIS 10/29/2006  . COPD 04/22/2009  . GERD 10/29/2006  . CIRRHOSIS 10/29/2006  . DEGENERATIVE JOINT DISEASE 06/15/2007  . KNEE PAIN, RIGHT 02/03/2010  . OSTEOPOROSIS 10/29/2006  . HYPERSOMNIA, ASSOCIATED WITH SLEEP APNEA 05/07/2009  . DYSPNEA 02/22/2008  . OTHER DYSPNEA AND RESPIRATORY ABNORMALITIES 04/23/2009  . CHEST PAIN 02/22/2008  . ASYMPTOMATIC POSTMENOPAUSAL STATUS 02/22/2008  . Gastroparesis   . Fatty liver   . Gastritis   . Diverticulosis   . Hyperplastic colon polyp   . OSA (obstructive sleep apnea)   . Cholelithiasis   . CHF (congestive heart failure)   . Stroke     mild stroke  . SEIZURE DISORDER  01/06/2010    pt not sure  . Headache   . Stroke   . Hypertension   . Diabetes mellitus   . Hyperlipidemia   . GERD (gastroesophageal reflux disease)   . MI (myocardial infarction)   . COPD (chronic obstructive pulmonary disease)   . Seizure   . Migraine     Past Surgical History  Procedure Laterality Date  . Appendectomy    . Abdominal hysterectomy  1980's  . (r) leg broken      when she was 18  . Catartact surgery    . Breast surgery  1986 or 1987    Breast reduction  . Esophagogastroduodenoscopy  08/17/2011    Procedure: ESOPHAGOGASTRODUODENOSCOPY (EGD);  Surgeon: Hart Carwin, MD;  Location: [facility];  Service: Endoscopy;  Laterality: N/A;  . Colonoscopy  08/17/2011    Procedure: COLONOSCOPY;  Surgeon: Hart Carwin, MD;  Location: [facility];  Service: Endoscopy;  Laterality: N/A;    History   Social History  . Marital Status: Single    Spouse Name: N/A    Number of Children: 5  . Years of Education: N/A   Occupational History  . Retired    Social History Main Topics  . Smoking status: Heavy Tobacco Smoker -- 0.25 packs/day  . Smokeless tobacco: Not on file  . Alcohol Use: No  . Drug Use: No  . Sexually Active: Not on file   Other Topics Concern  . Not on  file   Social History Narrative   ** Merged History Encounter **        Current Outpatient Prescriptions on File Prior to Visit  Medication Sig Dispense Refill  . albuterol (PROVENTIL HFA;VENTOLIN HFA) 108 (90 BASE) MCG/ACT inhaler Inhale 2 puffs into the lungs every morning. May take 2 puffs every 4-6 hours if needed for wheezing      . aspirin 81 MG tablet Take 81 mg by mouth daily.        . budesonide-formoterol (SYMBICORT) 160-4.5 MCG/ACT inhaler Inhale 2 puffs into the lungs 2 (two) times daily.        [name] buPROPion (WELLBUTRIN XL) 300 MG 24 hr tablet Take 300 mg by mouth daily.      . furosemide (LASIX) 40 MG tablet Take 1 tablet (40 mg total) by mouth daily.  30 tablet  11  . levETIRAcetam  (KEPPRA) 500 MG tablet Take 500 mg by mouth 2 (two) times daily.      [name] losartan-hydrochlorothiazide (HYZAAR) 100-25 MG per tablet Take 1 tablet by mouth daily.        . metoCLOPramide (REGLAN) 10 MG tablet Take 10 mg by mouth every 6 (six) hours as needed. For nausea/vomiting      . omeprazole (PRILOSEC) 40 MG capsule TAKE 1 CAPSULE BY MOUTH DAILY  30 capsule  0  . oxyCODONE (OXYCONTIN) 10 MG 12 hr tablet Take 1 tablet (10 mg total) by mouth every 12 (twelve) hours.  60 tablet  0  . oxyCODONE-acetaminophen (PERCOCET) 10-325 MG per tablet Take 1 tablet by mouth every 4 (four) hours as needed. For pain  120 tablet  0  . potassium chloride SA (K-DUR,KLOR-CON) 20 MEQ tablet Take 20 mEq by mouth daily.      . Probiotic Product (ALIGN) 4 MG CAPS Take 1 capsule by mouth daily.      . simvastatin (ZOCOR) 40 MG tablet Take 40 mg by mouth at bedtime.      . triamcinolone cream (KENALOG) 0.1 % Apply 1 application topically 3 (three) times daily as needed. for itching      . zolpidem (AMBIEN) 5 MG tablet Take 5 mg by mouth at bedtime as needed. For sleep       No current facility-administered medications on file prior to visit.    Allergies  Allergen Reactions  . Lisinopril     REACTION: Cough  . Pioglitazone     REACTION: Edema  . Varenicline Tartrate     REACTION: bad dreams    Family History  Problem Relation Age of Onset  . Ovarian cancer Mother   . Diabetes Mother   . Diabetes Other   . Heart disease Mother   . Heart disease Father   . Heart disease Maternal Grandmother   . Heart disease Sister   . Colon cancer Mother   . Clotting disorder Sister     BP 136/80  Pulse 84  Wt 383 lb (173.728 kg)  BMI 59.97 kg/m2  SpO2 97%  Review of Systems denies hypoglycemia.  Sob is less now    Objective:   Physical Exam VITAL SIGNS:  See vs page GENERAL: no distress.  Morbid obesity.  In wheelchair. Ext: 1+ bilat leg edema     Assessment & Plan:  DM: therapy limited by  noncompliance, and her need for a simple regimen.  i'll do the best i can.  this may be the best control she can achieve.   Low-back pain, therapy limited by  noncompliance.  i'll do the best i can. Edema, possibly caused or exac by norvasc Chest pain, uncertain etiology.  She exceeds the weight limit for cardiac nuc med.  She may be a candidate for diagnostic cath.

## 2012-07-19 ENCOUNTER — Telehealth: Payer: Self-pay

## 2012-07-19 NOTE — Telephone Encounter (Signed)
Pt advised.

## 2012-07-19 NOTE — Telephone Encounter (Signed)
Pt states she lost rx for pain med, can not find it, are you willing to fill pain med again?

## 2012-07-19 NOTE — Telephone Encounter (Signed)
Because it is a controlled substance, we cannot replace it right away.  We can replace it in 2 weeks

## 2012-07-26 ENCOUNTER — Ambulatory Visit (INDEPENDENT_AMBULATORY_CARE_PROVIDER_SITE_OTHER): Payer: Medicare PPO | Admitting: Physician Assistant

## 2012-07-26 ENCOUNTER — Encounter: Payer: Self-pay | Admitting: Physician Assistant

## 2012-07-26 VITALS — BP 138/64 | HR 80 | Ht 67.0 in | Wt 385.1 lb

## 2012-07-26 DIAGNOSIS — E785 Hyperlipidemia, unspecified: Secondary | ICD-10-CM

## 2012-07-26 DIAGNOSIS — I10 Essential (primary) hypertension: Secondary | ICD-10-CM

## 2012-07-26 DIAGNOSIS — J449 Chronic obstructive pulmonary disease, unspecified: Secondary | ICD-10-CM

## 2012-07-26 DIAGNOSIS — R609 Edema, unspecified: Secondary | ICD-10-CM

## 2012-07-26 DIAGNOSIS — R079 Chest pain, unspecified: Secondary | ICD-10-CM

## 2012-07-26 DIAGNOSIS — R0602 Shortness of breath: Secondary | ICD-10-CM

## 2012-07-26 MED ORDER — ISOSORBIDE MONONITRATE ER 30 MG PO TB24: 30.0000 mg | ORAL_TABLET | Freq: Every day | ORAL | Status: DC

## 2012-07-26 MED ORDER — SIMVASTATIN 40 MG PO TABS: 20.0000 mg | ORAL_TABLET | Freq: Every day | ORAL | Status: DC

## 2012-07-26 NOTE — Patient Instructions (Addendum)
Your physician has recommended you make the following change in your medication: 1).  DECREASE SIMVASTATIN ONE HALF TABLET ( 20 MG) EVERY NITE AT BEDTIME ( TAKE HALF OF THE (40 MG) TABLET DAILY 2.)  INCREASE LASIX (40 MG) TAKE 3 TIMES A DAY  FOR 3 DAYS THAN RESUME (40MG ) DAILY 3.)  INCREASE POTASSIUM ( 20 MEQ)  TAKE 3 TIMES A DAY FOR 3 DAYS THAN RESUME (20 MEQ) DAILY 4.)  START IMDUR (30 MG) DAILY THAT WAS SENT IN TO YOUR PHARMACY    Your physician recommends that you return for lab work in: ONE WEEK ON April 16TH JUST WALK IN OUR LAB HOURS ARE FROM 7:30 AM TO 5:00 PM   Your physician recommends that you schedule a follow-up appointment  WITH DR. Shirlee Latch IN TWO TO FOUR WEEKS

## 2012-07-26 NOTE — Progress Notes (Signed)
[address], [address] [place], [place]  [address] Phone: [phone] Fax:  [phone]  Date:  07/26/2012   ID:  [name], DOB 06/15/1940, MRN [id number]  PCP:  Romero Belling, MD  Primary Cardiologist:  Dr. Marca Ancona     History of Present Illness: [name] is a 72 y.o. female who returns for the evaluation of CP.  She has a hx of morbid obesity, prior CVA, DM2, HTN and smoking. She was previously evaluated by Dr. Shirlee Latch in 2011 for chest pain. Of note LHC 7/05 demonstrated nosignificant CAD, EF 65%. She had a Myoview in 11/10 that demonstrated an EF of 51% and slight reversible anterior and septal defect of borderline significance.  Echo 04/2009:  Mild LVH, EF 55-60%, Gr 1 DD, MAC.  LHC was arranged 1/11 and demonstrated largely patent vessels without evidence of CAD.  Positive Myoview was felt to be related to shifting breast attenuation. F/u was recommended as needed.   She recently saw her PCP with CP.  Had been seen in ED with CP and dyspnea.  CEs were negative.  She had not been taking her medications and these were restarted.  Myoview was arranged but she was over the weight limit and it was canceled.  She has now been referred for evaluation of CP.  Patient is quite sedentary.  Most activity is getting from her bed to a bedside commode.  She uses a power chair at home and arrives in a wheelchair today.  She notes CP off and on for the past month.  She notes it with the activity she does.  She notes it at rest.  It is left sided and sharp as well as tight.  She notes it last seconds.  She has tried NTG for it with relief.  She has some radiation to her right arm.  She notes assoc nausea and diaphoresis.  She sleeps on 2 pillows and has done so for years.  She often awakens gasping (? Apnea).  She has LE edema that is chronic but worse recently.  Apparently her Lasix was increased recently without much improvement.  She has chronic DOE.  No syncope.     Labs (3/13):  LDL 93 Labs (3/14):  K 4.1, Cr 0.97, Hgb 14.1, proBNP 107.4 Chest CT 3/14:  Neg for PE CXR 3/14:  Interstitial prominence may be accentuated by technique/patient body habitus. Mild interstitial edema or atypical infection not excluded.   Wt Readings from Last 3 Encounters:  07/26/12 385 lb 1.9 oz (174.689 kg)  07/17/12 383 lb (173.728 kg)  06/28/12 387 lb (175.542 kg)     Past Medical History  Diagnosis Date  . COLONIC POLYPS 09/13/2007  . DIABETES MELLITUS, TYPE I 10/29/2006  . HYPERCHOLESTEROLEMIA 02/25/2009  . Tobacco abuse 02/22/2008  . DEPRESSION 02/22/2008  . HYPERTENSION 12/20/2008  . 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 ALLERGIC RHINITIS 10/29/2006  . COPD 04/22/2009  . GERD 10/29/2006  . CIRRHOSIS 10/29/2006  . DEGENERATIVE JOINT DISEASE 06/15/2007  . OSTEOPOROSIS 10/29/2006  . OSA (obstructive sleep apnea) 05/07/2009  . CHEST PAIN 02/22/2008    LHC in 7/05 and 1/11: normal; Myoview in 11/10 that demonstrated an EF of 51% and slight reversible anterior and septal defect of borderline significance (false + test);  Echo 04/2009:  Mild LVH, EF 55-60%, Gr 1 DD, MAC.    [name] ASYMPTOMATIC POSTMENOPAUSAL STATUS 02/22/2008  . Gastroparesis   . Fatty liver   . Gastritis   .  Diverticulosis   . Cholelithiasis   . CHF (congestive heart failure)   . SEIZURE DISORDER 01/06/2010    pt not sure  . Headache   . Migraine     Current Outpatient Prescriptions  Medication Sig Dispense Refill  . albuterol (PROVENTIL HFA;VENTOLIN HFA) 108 (90 BASE) MCG/ACT inhaler Inhale 2 puffs into the lungs every morning. May take 2 puffs every 4-6 hours if needed for wheezing      . amLODipine (NORVASC) 2.5 MG tablet Take 2.5 mg by mouth daily.       [name] aspirin 81 MG tablet Take 81 mg by mouth daily.        . budesonide-formoterol (SYMBICORT) 160-4.5 MCG/ACT inhaler Inhale 2 puffs into the lungs 2 (two) times daily.        [name] buPROPion (WELLBUTRIN XL) 300 MG 24 hr tablet Take  300 mg by mouth daily.      . furosemide (LASIX) 40 MG tablet Take 1 tablet (40 mg total) by mouth daily.  30 tablet  11  . insulin glargine (LANTUS SOLOSTAR) 100 UNIT/ML injection Inject 3 mLs (300 Units total) into the skin daily. And pen needles 1/day  30 pen  PRN  . levETIRAcetam (KEPPRA) 500 MG tablet Take 500 mg by mouth 2 (two) times daily.      [name] losartan-hydrochlorothiazide (HYZAAR) 100-25 MG per tablet Take 1 tablet by mouth daily.        . metoCLOPramide (REGLAN) 10 MG tablet Take 10 mg by mouth every 6 (six) hours as needed. For nausea/vomiting      . nitroGLYCERIN (NITROSTAT) 0.4 MG SL tablet Place 1 tablet (0.4 mg total) under the tongue every 5 (five) minutes as needed for chest pain.  50 tablet  3  . omeprazole (PRILOSEC) 40 MG capsule Take 1 capsule (40 mg total) by mouth daily.  30 capsule  3  . oxyCODONE (OXYCONTIN) 10 MG 12 hr tablet Take 1 tablet (10 mg total) by mouth every 12 (twelve) hours.  60 tablet  0  . oxyCODONE-acetaminophen (PERCOCET) 10-325 MG per tablet Take 1 tablet by mouth every 4 (four) hours as needed. For pain  120 tablet  0  . potassium chloride SA (K-DUR,KLOR-CON) 20 MEQ tablet Take 20 mEq by mouth daily.      . Probiotic Product (ALIGN) 4 MG CAPS Take 1 capsule by mouth daily.      . simvastatin (ZOCOR) 40 MG tablet Take 40 mg by mouth at bedtime.      . triamcinolone cream (KENALOG) 0.1 % Apply 1 application topically 3 (three) times daily as needed. for itching      . zolpidem (AMBIEN) 5 MG tablet Take 5 mg by mouth at bedtime as needed. For sleep      . LEVEMIR FLEXPEN 100 UNIT/ML injection Inject 350 Units into the skin daily.        No current facility-administered medications for this visit.    Allergies:    Allergies  Allergen Reactions  . Lisinopril     REACTION: Cough  . Pioglitazone     REACTION: Edema  . Varenicline Tartrate     REACTION: bad dreams    Social History:  The patient  reports that she has been smoking.  She does not  have any smokeless tobacco history on file. She reports that she does not drink alcohol or use illicit drugs.   ROS:  Please see the history of present illness.   She has a chronic cough with  white sputum.  No hemoptysis.  All other systems reviewed and negative.   PHYSICAL EXAM: VS:  BP 138/64  Pulse 80  Ht 5\' 7"  (1.702 m)  Wt 385 lb 1.9 oz (174.689 kg)  BMI 60.3 kg/m2  SpO2 93% Well nourished, well developed, in no acute distress HEENT: normal Neck: cannot asses JVD at 90 degrees Cardiac:  distant S1, S2; RRR; no murmur Lungs:  Decreased breath sounds bilaterally with diffuse rhonchi Abd: protuberant Ext: trace to 1+ bilateral LE edema Skin: warm and dry Neuro:  Somewhat lethargic; CNs 2-12 intact, no focal abnormalities noted  EKG:  NSR, HR 80, normal axis, PVCs, low voltage, NSSTTW changes, no change from prior tracing     ASSESSMENT AND PLAN:  1. Chest Pain:  Symptoms with atypical and typical features.  She has significant risk factors.  However, she has had 2 negative cardiac caths in the past.  Last one was in 2011.  The likelihood that she would develop significant CAD in 3 years is quite low.  No objective evidence of ischemia during recent evaluation.  Chest CT was negative for PE.  She likely has chest wall pain from COPD and cough.  It is possible she is somewhat volume overloaded.  She may have small vessel disease in the setting of diabetes as her CP does respond to NTG.  She is too large for a myoview.  At this point, I would not recommend going directly to cardiac cath.    Increase Lasix to 40 mg BID x 3 days along with K+ 20 mEq bid x 3 days.  Then resume prior dose.  Check BMET in 1 week.  Start Imdur 30 mg QD.  D/c Cigs.  F/u with Dr. Marca Ancona to determine need for further testing if any.  2. COPD:  Likely contributing.  D/c cigs. 3. Sleep Apnea:  She has been noncompliant with CPAP in the past.  She has not seen Dr. Vassie Loll in years.  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   4. Morbid Obesity:  Significantly limiting her functional status. 5. Hypertension:  Controlled. 6. Hyperlipidemia:  As she is on norvasc, I will decrease her simvastatin to 20 mg QHS. 7. Disposition:  F/u with Dr. Marca Ancona in 2-4 weeks.  Signed, Tereso Newcomer, PA-C  2:59 PM 07/26/2012

## 2012-08-01 ENCOUNTER — Telehealth: Payer: Self-pay | Admitting: Endocrinology

## 2012-08-01 NOTE — Telephone Encounter (Signed)
Patient left VM requesting pain meds. CB# [phone] / [name]

## 2012-08-02 ENCOUNTER — Telehealth: Payer: Self-pay | Admitting: *Deleted

## 2012-08-02 ENCOUNTER — Other Ambulatory Visit: Payer: Medicare PPO

## 2012-08-02 MED ORDER — OXYCODONE HCL 10 MG PO TB12: 10.0000 mg | ORAL_TABLET | Freq: Two times a day (BID) | ORAL | Status: DC

## 2012-08-02 NOTE — Telephone Encounter (Signed)
Called pt and let her know that her rx was ready to pick up.

## 2012-08-02 NOTE — Telephone Encounter (Signed)
i printed 

## 2012-08-02 NOTE — Telephone Encounter (Signed)
Carollee Herter advised pt rx is ready

## 2012-08-08 ENCOUNTER — Ambulatory Visit: Payer: Medicare PPO | Admitting: Cardiovascular Disease

## 2012-08-09 ENCOUNTER — Ambulatory Visit (INDEPENDENT_AMBULATORY_CARE_PROVIDER_SITE_OTHER): Payer: Medicare PPO | Admitting: Cardiology

## 2012-08-09 ENCOUNTER — Encounter: Payer: Self-pay | Admitting: Cardiology

## 2012-08-09 VITALS — BP 122/80 | HR 77 | Ht 67.0 in | Wt 382.0 lb

## 2012-08-09 DIAGNOSIS — I509 Heart failure, unspecified: Secondary | ICD-10-CM

## 2012-08-09 DIAGNOSIS — R079 Chest pain, unspecified: Secondary | ICD-10-CM

## 2012-08-09 DIAGNOSIS — R0602 Shortness of breath: Secondary | ICD-10-CM

## 2012-08-09 DIAGNOSIS — I5032 Chronic diastolic (congestive) heart failure: Secondary | ICD-10-CM

## 2012-08-09 DIAGNOSIS — F17200 Nicotine dependence, unspecified, uncomplicated: Secondary | ICD-10-CM

## 2012-08-09 MED ORDER — FUROSEMIDE 40 MG PO TABS: ORAL_TABLET | ORAL | Status: DC

## 2012-08-09 MED ORDER — ISOSORBIDE MONONITRATE ER 60 MG PO TB24: 60.0000 mg | ORAL_TABLET | Freq: Every day | ORAL | Status: DC

## 2012-08-09 NOTE — Progress Notes (Signed)
Patient ID: [name], female   DOB: 30-Sep-1940, 72 y.o.   MRN: [id number] PCP: Dr. Everardo All  72 yo with history morbid obesity, prior stroke, diabetes, HTN, chronic diastolic CHF, and smoking presents for followup of chest pain and dyspnea.  Patient had a Lexiscan myoview in 11/10 given her chest pain. This showed EF 51%, slight reversible anterior and septal defect of borderline significance. Given her body habitus, this certainly could have been due to shifting breast attenuation.  Given the abnormal stress test, patient underwent a left heart cath from the right radial artery. There was no angiographic coronary disease. Echo (1/11) showed normal EF with mild diastolic dysfunction and no significant valvular abnormalities.   She was recently seen by Tereso Newcomer in this office with increased chest pain.  She had been out of a number of medications.  Her meds were restarted and she was begun on Imdur.  Since then, the chest pain has considerably improved.  She will still get occasional chest tightness but not daily as it had been.  There is no particular trigger (not related to food or exertion).  She is chronically short of breath after walking 20-30 feet (for 71 years at least).  She is not very active.  She is quite inactive and uses a power chair when she leaves the house.   Labs (1/11): BNP 21, creatinine 1.0  Labs (3/14): K 4.1, creatinine 0.9, BNP 107  Allergies:  1) ! * Actos  2) ! Zestril    Past Medical History:  1. Allergic rhinitis  2. Diabetes mellitus, type I  3. GERD  4. Osteoporosis  5. 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  6. Morbid obesity  7. Dyslipidemia  8. 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  9. Gallstones  10. Chronic diastolic CHF. Echo (1/11) showed EF 55-60%, mild diastolic dysfunction, valves ok, unable to estimate PA systolic pressure.  11. Chest pain: myoview (11/10) was suggestive of anterior ischemia.  Left heart cath from the right radial artery showed no angiographic coronary disease in 1/11.   Family History:  mother had uncertain type of cancer  No early early CAD.  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 She has been smoking since she was 17.  Alcohol Use - no  Current Outpatient Prescriptions  Medication Sig Dispense Refill  . albuterol (PROVENTIL HFA;VENTOLIN HFA) 108 (90 BASE) MCG/ACT inhaler Inhale 2 puffs into the lungs every morning. May take 2 puffs every 4-6 hours if needed for wheezing      . amLODipine (NORVASC) 2.5 MG tablet Take 2.5 mg by mouth daily.       [name] aspirin 81 MG tablet Take 81 mg by mouth daily.        . budesonide-formoterol (SYMBICORT) 160-4.5 MCG/ACT inhaler Inhale 2 puffs into the lungs 2 (two) times daily.        [name] buPROPion (WELLBUTRIN XL) 300 MG 24 hr tablet Take 300 mg by mouth daily.      . insulin glargine (LANTUS SOLOSTAR) 100 UNIT/ML injection Inject 3 mLs (300 Units total) into the skin daily. And pen needles 1/day  30 pen  PRN  . LEVEMIR FLEXPEN 100 UNIT/ML injection Inject 350 Units into the skin daily.       [name] levETIRAcetam (KEPPRA) 500 MG tablet Take 500 mg by mouth 2 (two) times daily.      [name]  losartan-hydrochlorothiazide (HYZAAR) 100-25 MG per tablet Take 1 tablet by mouth daily.        . metoCLOPramide (REGLAN) 10 MG tablet Take 10 mg by mouth every 6 (six) hours as needed. For nausea/vomiting      . nitroGLYCERIN (NITROSTAT) 0.4 MG SL tablet Place 1 tablet (0.4 mg total) under the tongue every 5 (five) minutes as needed for chest pain.  50 tablet  3  . omeprazole (PRILOSEC) 40 MG capsule Take 1 capsule (40 mg total) by mouth daily.  30 capsule  3  . oxyCODONE (OXYCONTIN) 10 MG 12 hr tablet Take 1 tablet (10 mg total) by mouth every 12 (twelve) hours.  60 tablet  0  . oxyCODONE-acetaminophen (PERCOCET) 10-325 MG per tablet  Take 1 tablet by mouth every 4 (four) hours as needed. For pain  120 tablet  0  . potassium chloride SA (K-DUR,KLOR-CON) 20 MEQ tablet Take 20 mEq by mouth daily.      . Probiotic Product (ALIGN) 4 MG CAPS Take 1 capsule by mouth daily.      . simvastatin (ZOCOR) 40 MG tablet Take 0.5 tablets (20 mg total) by mouth at bedtime.  30 tablet  6  . triamcinolone cream (KENALOG) 0.1 % Apply 1 application topically 3 (three) times daily as needed. for itching      . zolpidem (AMBIEN) 5 MG tablet Take 5 mg by mouth at bedtime as needed. For sleep      . furosemide (LASIX) 40 MG tablet 1 tablet in the AM and 1/2 tablet (total 20mg ) in the PM  45 tablet  6  . isosorbide mononitrate (IMDUR) 60 MG 24 hr tablet Take 1 tablet (60 mg total) by mouth daily.  30 tablet  6   No current facility-administered medications for this visit.    BP 122/80  Pulse 77  Ht 5\' 7"  (1.702 m)  Wt 382 lb (173.274 kg)  BMI 59.82 kg/m2  SpO2 98% General: NAD, obese Neck: Thick, JVP difficult, no thyromegaly or thyroid nodule.  Lungs: Clear to auscultation bilaterally with normal respiratory effort. CV: Nondisplaced PMI.  Heart regular S1/S2, no S3/S4, no murmur.  1+ edema to knees bilaterally.  No carotid bruit.  Normal pedal pulses.  Abdomen: Soft, nontender, no hepatosplenomegaly, no distention.   Neurologic: Alert and oriented x 3.  Psych: Normal affect. Extremities: No clubbing or cyanosis.    Assessment/Plan: 1. Dyspnea: Multifactorial.  Related to chronic diastolic CHF, OSA (suspect OHS), COPD, and obesity. She has not been to visit pulmonary in years.  I will have her followup with Dr. Vassie Loll.   2. Smoking: I strongly counseled her to quit.  3. Chronic diastolic CHF: She may be volume overloaded.  I will increase Lasix to 40 qam, 20 qpm with BMET in 10 days.  Echo to reassess LV systolic function, RV function, and pulmonary hypertension.   4. Chest pain: Atypical.  Negative cath in 1/11.  Symptoms did get better  with Imdur (? Microvascular ischemia) so I will increase imdur to 60 mg daily.  5. I will try to arrange home PT to mobilize her.   Marca Ancona 08/10/2012

## 2012-08-09 NOTE — Patient Instructions (Addendum)
Increase lasix to 40mg  in the AM and 20mg  in the PM. This will be 1 of a 40mg  tablet in the AM and 1/2 of a 40mg  tablet in the PM.  Increase Imdur (isosorbide) to 60mg  daily. You can take 2 of your 30mg  tablets daily at the same time and use your current supply.  Your physician recommends that you return for lab work in: 10 days--BMET/BNP.   Your physician has requested that you have an echocardiogram. Echocardiography is a painless test that uses sound waves to create images of your heart. It provides your doctor with information about the size and shape of your heart and how well your heart's chambers and valves are working. This procedure takes approximately one hour. There are no restrictions for this procedure.   Dr Shirlee Latch will refer you for home physical therapy.  Your physician recommends that you schedule a follow-up appointment in: 2 months with Dr Shirlee Latch.

## 2012-08-10 ENCOUNTER — Telehealth: Payer: Self-pay | Admitting: Cardiology

## 2012-08-10 DIAGNOSIS — I5032 Chronic diastolic (congestive) heart failure: Secondary | ICD-10-CM | POA: Insufficient documentation

## 2012-08-10 NOTE — Telephone Encounter (Signed)
New problem   Melissa/[facility] need more info on physical therapy delay. Please call

## 2012-08-10 NOTE — Telephone Encounter (Signed)
Melissa aware nurse assessment is OK.

## 2012-08-10 NOTE — Telephone Encounter (Signed)
Spoke with Melissa at [facility]. It will be a delay starting physical therapy due to volume of patients in [place] area. If Dr Shirlee Latch wants to order a nurse  assessment they could do that adn evaluate other areas of need. I will forward to Dr Shirlee Latch for review.

## 2012-08-10 NOTE — Telephone Encounter (Signed)
Nurse assessment would be fine.

## 2012-08-14 ENCOUNTER — Telehealth: Payer: Self-pay

## 2012-08-14 ENCOUNTER — Other Ambulatory Visit: Payer: Self-pay | Admitting: *Deleted

## 2012-08-14 MED ORDER — INSULIN PEN NEEDLE 32G X 4 MM MISC: Status: DC

## 2012-08-14 MED ORDER — INSULIN GLARGINE 100 UNIT/ML ~~LOC~~ SOLN: 300.0000 [IU] | Freq: Every day | SUBCUTANEOUS | Status: DC

## 2012-08-14 NOTE — Telephone Encounter (Signed)
[name] advised of med list

## 2012-08-14 NOTE — Telephone Encounter (Signed)
Robin nurse with [facility] needs to verify how much insulin patient should be taking, [phone]

## 2012-08-14 NOTE — Telephone Encounter (Signed)
Med list is correct

## 2012-08-16 ENCOUNTER — Ambulatory Visit: Payer: Medicare PPO | Admitting: Endocrinology

## 2012-08-16 DIAGNOSIS — Z0289 Encounter for other administrative examinations: Secondary | ICD-10-CM

## 2012-08-24 ENCOUNTER — Other Ambulatory Visit (HOSPITAL_COMMUNITY): Payer: Medicare PPO

## 2012-08-24 ENCOUNTER — Other Ambulatory Visit (INDEPENDENT_AMBULATORY_CARE_PROVIDER_SITE_OTHER): Payer: Medicare PPO

## 2012-08-24 DIAGNOSIS — R0602 Shortness of breath: Secondary | ICD-10-CM

## 2012-08-24 LAB — BASIC METABOLIC PANEL
BUN: 12 mg/dL (ref 6–23)
CO2: 29 mEq/L (ref 19–32)
Chloride: 96 mEq/L (ref 96–112)
GFR: 72.85 mL/min (ref >60.00)
Glucose, Bld: 392 mg/dL — ABNORMAL HIGH (ref 70–99)
Potassium: 4 mEq/L (ref 3.5–5.1)
Sodium: 133 mEq/L — ABNORMAL LOW (ref 135–145)

## 2012-08-25 ENCOUNTER — Ambulatory Visit (HOSPITAL_COMMUNITY): Payer: Medicare PPO | Attending: Cardiology

## 2012-08-25 DIAGNOSIS — J449 Chronic obstructive pulmonary disease, unspecified: Secondary | ICD-10-CM | POA: Insufficient documentation

## 2012-08-25 DIAGNOSIS — I509 Heart failure, unspecified: Secondary | ICD-10-CM

## 2012-08-25 DIAGNOSIS — F17200 Nicotine dependence, unspecified, uncomplicated: Secondary | ICD-10-CM

## 2012-08-25 DIAGNOSIS — R0602 Shortness of breath: Secondary | ICD-10-CM | POA: Insufficient documentation

## 2012-08-25 DIAGNOSIS — E7800 Pure hypercholesterolemia, unspecified: Secondary | ICD-10-CM | POA: Insufficient documentation

## 2012-08-25 DIAGNOSIS — K7460 Unspecified cirrhosis of liver: Secondary | ICD-10-CM | POA: Insufficient documentation

## 2012-08-25 DIAGNOSIS — R079 Chest pain, unspecified: Secondary | ICD-10-CM | POA: Insufficient documentation

## 2012-08-25 DIAGNOSIS — I5032 Chronic diastolic (congestive) heart failure: Secondary | ICD-10-CM

## 2012-08-25 DIAGNOSIS — E1165 Type 2 diabetes mellitus with hyperglycemia: Secondary | ICD-10-CM

## 2012-08-25 DIAGNOSIS — Z8673 Personal history of transient ischemic attack (TIA), and cerebral infarction without residual deficits: Secondary | ICD-10-CM | POA: Insufficient documentation

## 2012-08-25 DIAGNOSIS — M1990 Unspecified osteoarthritis, unspecified site: Secondary | ICD-10-CM | POA: Insufficient documentation

## 2012-08-25 DIAGNOSIS — J4489 Other specified chronic obstructive pulmonary disease: Secondary | ICD-10-CM | POA: Insufficient documentation

## 2012-08-25 DIAGNOSIS — R0989 Other specified symptoms and signs involving the circulatory and respiratory systems: Secondary | ICD-10-CM

## 2012-08-25 NOTE — Progress Notes (Signed)
Echocardiogram performed.  

## 2012-10-09 ENCOUNTER — Telehealth: Payer: Self-pay | Admitting: Endocrinology

## 2012-10-09 ENCOUNTER — Encounter: Payer: Self-pay | Admitting: Cardiology

## 2012-10-09 ENCOUNTER — Ambulatory Visit (INDEPENDENT_AMBULATORY_CARE_PROVIDER_SITE_OTHER): Payer: Medicare PPO | Admitting: Cardiology

## 2012-10-09 VITALS — BP 126/64 | HR 76 | Ht 67.0 in | Wt 378.0 lb

## 2012-10-09 DIAGNOSIS — I509 Heart failure, unspecified: Secondary | ICD-10-CM

## 2012-10-09 DIAGNOSIS — R079 Chest pain, unspecified: Secondary | ICD-10-CM

## 2012-10-09 DIAGNOSIS — F17200 Nicotine dependence, unspecified, uncomplicated: Secondary | ICD-10-CM

## 2012-10-09 DIAGNOSIS — I5032 Chronic diastolic (congestive) heart failure: Secondary | ICD-10-CM

## 2012-10-09 DIAGNOSIS — E7800 Pure hypercholesterolemia, unspecified: Secondary | ICD-10-CM

## 2012-10-09 MED ORDER — HYDROCODONE-ACETAMINOPHEN 10-325 MG PO TABS: 1.0000 | ORAL_TABLET | Freq: Four times a day (QID) | ORAL | Status: DC | PRN

## 2012-10-09 NOTE — Telephone Encounter (Signed)
Returned pt's call. Pt states they oxycontin she is on has broken her out. Pt would like to go back on oxycodone. Please advise.

## 2012-10-09 NOTE — Patient Instructions (Addendum)
Your physician wants you to follow-up in: 6 months with Tereso Newcomer, PA,c. (December 2014). You will receive a reminder letter in the mail two months in advance. If you don't receive a letter, please call our office to schedule the follow-up appointment.   Your physician wants you to follow-up in: 1 year with Dr Shirlee Latch. (June 2015).  You will receive a reminder letter in the mail two months in advance. If you don't receive a letter, please call our office to schedule the follow-up appointment.

## 2012-10-09 NOTE — Telephone Encounter (Signed)
i printed Ov is due < 30 days

## 2012-10-10 ENCOUNTER — Telehealth: Payer: Self-pay | Admitting: Endocrinology

## 2012-10-10 NOTE — Telephone Encounter (Signed)
Please call nurse with [facility]. She needs a verbal to continue PT for patient. The patient is making good progress however did have a set back 2 weeks ago when she fell.

## 2012-10-10 NOTE — Telephone Encounter (Signed)
Patient is going to check with her son to see when she can get a ride. She will CB to schedule an OV.

## 2012-10-10 NOTE — Telephone Encounter (Signed)
Please schedule ov.  

## 2012-10-10 NOTE — Telephone Encounter (Signed)
Linda with [facility] given verbal ok oer Dr. Everardo All

## 2012-10-10 NOTE — Progress Notes (Signed)
Patient ID: [name], female   DOB: 1940/12/10, 72 y.o.   MRN: [id number] PCP: Dr. Everardo All  72 yo with history morbid obesity, prior stroke, diabetes, HTN, chronic diastolic CHF, and smoking presents for followup of chest pain and dyspnea.  Patient had a Lexiscan myoview in 11/10 given her chest pain. This showed EF 51%, slight reversible anterior and septal defect of borderline significance. Given her body habitus, this certainly could have been due to shifting breast attenuation.  Given the abnormal stress test, patient underwent a left heart cath from the right radial artery. There was no angiographic coronary disease. Echo (5/14) showed EF 60% with moderate LVH.   At my last appointment with her, I increased Imdur (for possible microvascular angina) and increased her Lasix.  She has lost 4 lbs.  She continues to be short of breath after walking about 20 feet but this is a bit improved.  She has been working with PT.  Chest pain is improved with increased Imdur.  She gets rare atypical chest pain that has no particular trigger.   Labs (1/11): BNP 21, creatinine 1.0  Labs (3/14): K 4.1, creatinine 0.9, BNP 107 Labs (5/14): K 4, creatinine 1.0, BNP 37  Allergies:  1) ! * Actos  2) ! Zestril    Past Medical History:  1. Allergic rhinitis  2. Diabetes mellitus, type I  3. GERD  4. Osteoporosis  5. 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  6. Morbid obesity  7. Dyslipidemia  8. 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  9. Gallstones  10. Chronic diastolic CHF. Echo (1/11) showed EF 55-60%, mild diastolic dysfunction, valves ok, unable to estimate PA systolic pressure.  Echo (5/14): EF 60%, moderate LVH.  11. Chest pain: myoview (11/10) was suggestive of anterior ischemia. Left heart cath from the right radial artery showed no angiographic coronary disease in 1/11.   Family History:  mother had uncertain type of cancer  No early early CAD.   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 She has been smoking since she was 17.  Alcohol Use - no  Current Outpatient Prescriptions  Medication Sig Dispense Refill  . albuterol (PROVENTIL HFA;VENTOLIN HFA) 108 (90 BASE) MCG/ACT inhaler Inhale 2 puffs into the lungs every morning. May take 2 puffs every 4-6 hours if needed for wheezing      . amLODipine (NORVASC) 2.5 MG tablet Take 2.5 mg by mouth daily.       [name] aspirin 81 MG tablet Take 81 mg by mouth daily.        . budesonide-formoterol (SYMBICORT) 160-4.5 MCG/ACT inhaler Inhale 2 puffs into the lungs 2 (two) times daily.        [name] buPROPion (WELLBUTRIN XL) 300 MG 24 hr tablet Take 300 mg by mouth daily.      . furosemide (LASIX) 40 MG tablet 1 tablet in the AM and 1/2 tablet (total 20mg ) in the PM  45 tablet  6  . insulin glargine (LANTUS SOLOSTAR) 100 UNIT/ML injection Inject 3 mLs (300 Units total) into the skin daily.  30 pen  PRN  . Insulin Pen Needle 32G X 4 MM MISC USE AS DIRECTED 1 TIME DAILY  100 each  PRN  . isosorbide mononitrate (IMDUR) 60 MG 24 hr tablet Take 1 tablet (60 mg total) by mouth daily.  30 tablet  6  . LEVEMIR FLEXPEN 100 UNIT/ML injection Inject 350 Units into the skin daily.       [name] levETIRAcetam (KEPPRA) 500 MG tablet Take 500 mg by mouth 2 (two) times daily.      [name] losartan-hydrochlorothiazide (HYZAAR) 100-25 MG per tablet Take 1 tablet by mouth daily.        . metoCLOPramide (REGLAN) 10 MG tablet Take 10 mg by mouth every 6 (six) hours as needed. For nausea/vomiting      . nitroGLYCERIN (NITROSTAT) 0.4 MG SL tablet Place 1 tablet (0.4 mg total) under the tongue every 5 (five) minutes as needed for chest pain.  50 tablet  3  . omeprazole (PRILOSEC) 40 MG capsule Take 1 capsule (40 mg total) by mouth daily.  30 capsule  3  . potassium chloride SA (K-DUR,KLOR-CON) 20 MEQ tablet Take 20 mEq by  mouth daily.      . Probiotic Product (ALIGN) 4 MG CAPS Take 1 capsule by mouth daily.      . simvastatin (ZOCOR) 40 MG tablet Take 0.5 tablets (20 mg total) by mouth at bedtime.  30 tablet  6  . triamcinolone cream (KENALOG) 0.1 % Apply 1 application topically 3 (three) times daily as needed. for itching      . zolpidem (AMBIEN) 5 MG tablet Take 5 mg by mouth at bedtime as needed. For sleep      . HYDROcodone-acetaminophen (NORCO) 10-325 MG per tablet Take 1 tablet by mouth every 6 (six) hours as needed for pain.  30 tablet  0   No current facility-administered medications for this visit.    BP 126/64  Pulse 76  Ht 5\' 7"  (1.702 m)  Wt 378 lb (171.46 kg)  BMI 59.19 kg/m2  SpO2 93% General: NAD, obese Neck: Thick, JVP difficult, no thyromegaly or thyroid nodule.  Lungs: Clear to auscultation bilaterally with normal respiratory effort. CV: Nondisplaced PMI.  Heart regular S1/S2, no S3/S4, no murmur.  1+ edema 1/2 to knees bilaterally.  No carotid bruit.  Normal pedal pulses.  Abdomen: Soft, nontender, no hepatosplenomegaly, no distention.   Neurologic: Alert and oriented x 3.  Psych: Normal affect. Extremities: No clubbing or cyanosis.    Assessment/Plan: 1. Dyspnea: Multifactorial.  Related to chronic diastolic CHF, OSA (suspect OHS), COPD, and obesity. She has not been to visit pulmonary in years.  I will have her followup with Dr. Vassie Loll.   2. Smoking: I strongly counseled her to quit.  3. Chronic diastolic CHF: Stable echo.  Symptoms improved with increased Lasix.  No JVD now.  Continue current Lasix.   4. Chest pain: Atypical.  Negative cath in 1/11.  Symptoms did get better with increased Imdur (? Microvascular ischemia).   Marca Ancona 10/10/2012

## 2012-10-13 ENCOUNTER — Ambulatory Visit (INDEPENDENT_AMBULATORY_CARE_PROVIDER_SITE_OTHER): Payer: Medicare PPO | Admitting: Endocrinology

## 2012-10-13 ENCOUNTER — Encounter: Payer: Self-pay | Admitting: Endocrinology

## 2012-10-13 VITALS — BP 134/84 | HR 78

## 2012-10-13 DIAGNOSIS — E119 Type 2 diabetes mellitus without complications: Secondary | ICD-10-CM

## 2012-10-13 DIAGNOSIS — R0602 Shortness of breath: Secondary | ICD-10-CM

## 2012-10-13 DIAGNOSIS — R079 Chest pain, unspecified: Secondary | ICD-10-CM

## 2012-10-13 DIAGNOSIS — R609 Edema, unspecified: Secondary | ICD-10-CM

## 2012-10-13 MED ORDER — TRIAMCINOLONE ACETONIDE 0.1 % EX CREA: TOPICAL_CREAM | Freq: Two times a day (BID) | CUTANEOUS | Status: DC

## 2012-10-13 MED ORDER — TRIAMCINOLONE ACETONIDE 0.1 % EX CREA: 1.0000 "application " | TOPICAL_CREAM | Freq: Three times a day (TID) | CUTANEOUS | Status: DC | PRN

## 2012-10-13 NOTE — Patient Instructions (Addendum)
check your blood sugar twice a day.  vary the time of day when you check, between before the 3 meals, and at bedtime.  also check if you have symptoms of your blood sugar being too high or too low.  please keep a record of the readings and bring it to your next appointment here.  please call us sooner if your blood sugar goes below 70, or if you have a lot of readings over 200. blood tests are being requested for you today.  We'll contact you with results. Please come back for a "medicare wellness" appointment in 3 months.   Please let us know which causes you a rash: hydrocodone or oxycodone?

## 2012-10-13 NOTE — Progress Notes (Signed)
Subjective:    Patient ID: [name], female    DOB: 1941-02-15, 72 y.o.   MRN: [id number]  HPI Pt returns for f/u of insulin-requiring DM (dx'ed 1997; He has mild if any neuropathy of the lower extremities.complicated by PAD; characterized by severe insulin resistance; therapy limited by noncompliance with cbg recording and morbid obesity; she was on humalog 75/25 as recently as 2011, but she has done better with a simple qd insulin regimen; she last had severe hypoglycemia in approx 2011).   no cbg record, but states cbg's vary from 150-300's.  She says there is no trend throughout the day.   Pt states 3 weeks of moderate rash at the medial aspects of the arms, and assoc itching.   Pt wants refill of her pain med, but does not know which she is allergic to. Past Medical History  Diagnosis Date  . COLONIC POLYPS 09/13/2007  . DIABETES MELLITUS, TYPE I 10/29/2006  . HYPERCHOLESTEROLEMIA 02/25/2009  . Tobacco abuse 02/22/2008  . DEPRESSION 02/22/2008  . HYPERTENSION 12/20/2008  . 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 ALLERGIC RHINITIS 10/29/2006  . COPD 04/22/2009  . GERD 10/29/2006  . CIRRHOSIS 10/29/2006  . DEGENERATIVE JOINT DISEASE 06/15/2007  . OSTEOPOROSIS 10/29/2006  . OSA (obstructive sleep apnea) 05/07/2009  . CHEST PAIN 02/22/2008    LHC in 7/05 and 1/11: normal; Myoview in 11/10 that demonstrated an EF of 51% and slight reversible anterior and septal defect of borderline significance (false + test);  Echo 04/2009:  Mild LVH, EF 55-60%, Gr 1 DD, MAC.    [name] ASYMPTOMATIC POSTMENOPAUSAL STATUS 02/22/2008  . Gastroparesis   . Fatty liver   . Gastritis   . Diverticulosis   . Cholelithiasis   . CHF (congestive heart failure)   . SEIZURE DISORDER 01/06/2010    pt not sure  . Headache(784.0)   . Migraine     Past Surgical History  Procedure Laterality Date  . Appendectomy    . Abdominal hysterectomy  1980's  . (r) leg broken      when she was 18  .  Catartact surgery    . Breast surgery  1986 or 1987    Breast reduction  . Esophagogastroduodenoscopy  08/17/2011    Procedure: ESOPHAGOGASTRODUODENOSCOPY (EGD);  Surgeon: Hart Carwin, MD;  Location: [facility];  Service: Endoscopy;  Laterality: N/A;  . Colonoscopy  08/17/2011    Procedure: COLONOSCOPY;  Surgeon: Hart Carwin, MD;  Location: [facility];  Service: Endoscopy;  Laterality: N/A;    History   Social History  . Marital Status: Single    Spouse Name: N/A    Number of Children: 5  . Years of Education: N/A   Occupational History  . Retired    Social History Main Topics  . Smoking status: Heavy Tobacco Smoker -- 0.25 packs/day  . Smokeless tobacco: Not on file  . Alcohol Use: No  . Drug Use: No  . Sexually Active: Not on file   Other Topics Concern  . Not on file   Social History Narrative   ** Merged History Encounter **        Current Outpatient Prescriptions on File Prior to Visit  Medication Sig Dispense Refill  . albuterol (PROVENTIL HFA;VENTOLIN HFA) 108 (90 BASE) MCG/ACT inhaler Inhale 2 puffs into the lungs every morning. May take 2 puffs every 4-6 hours if needed for wheezing      . amLODipine (NORVASC) 2.5 MG tablet Take 2.5 mg by  mouth daily.       [name] aspirin 81 MG tablet Take 81 mg by mouth daily.        . budesonide-formoterol (SYMBICORT) 160-4.5 MCG/ACT inhaler Inhale 2 puffs into the lungs 2 (two) times daily.        [name] buPROPion (WELLBUTRIN XL) 300 MG 24 hr tablet Take 300 mg by mouth daily.      . furosemide (LASIX) 40 MG tablet 1 tablet in the AM and 1/2 tablet (total 20mg ) in the PM  45 tablet  6  . insulin glargine (LANTUS SOLOSTAR) 100 UNIT/ML injection Inject 3 mLs (300 Units total) into the skin daily.  30 pen  PRN  . Insulin Pen Needle 32G X 4 MM MISC USE AS DIRECTED 1 TIME DAILY  100 each  PRN  . isosorbide mononitrate (IMDUR) 60 MG 24 hr tablet Take 1 tablet (60 mg total) by mouth daily.  30 tablet  6  . LEVEMIR FLEXPEN 100 UNIT/ML  injection Inject 350 Units into the skin daily.       [name] levETIRAcetam (KEPPRA) 500 MG tablet Take 500 mg by mouth 2 (two) times daily.      [name] losartan-hydrochlorothiazide (HYZAAR) 100-25 MG per tablet Take 1 tablet by mouth daily.        . metoCLOPramide (REGLAN) 10 MG tablet Take 10 mg by mouth every 6 (six) hours as needed. For nausea/vomiting      . nitroGLYCERIN (NITROSTAT) 0.4 MG SL tablet Place 1 tablet (0.4 mg total) under the tongue every 5 (five) minutes as needed for chest pain.  50 tablet  3  . omeprazole (PRILOSEC) 40 MG capsule Take 1 capsule (40 mg total) by mouth daily.  30 capsule  3  . potassium chloride SA (K-DUR,KLOR-CON) 20 MEQ tablet Take 20 mEq by mouth daily.      . Probiotic Product (ALIGN) 4 MG CAPS Take 1 capsule by mouth daily.      . simvastatin (ZOCOR) 40 MG tablet Take 0.5 tablets (20 mg total) by mouth at bedtime.  30 tablet  6  . zolpidem (AMBIEN) 5 MG tablet Take 5 mg by mouth at bedtime as needed. For sleep       No current facility-administered medications on file prior to visit.    Allergies  Allergen Reactions  . Lisinopril     REACTION: Cough  . Pioglitazone     REACTION: Edema  . Varenicline Tartrate     REACTION: bad dreams  . Oxycontin (Oxycodone Hcl) Rash   Family History  Problem Relation Age of Onset  . Ovarian cancer Mother   . Diabetes Mother   . Diabetes Other   . Heart disease Mother   . Heart disease Father   . Heart disease Maternal Grandmother   . Heart disease Sister   . Colon cancer Mother   . Clotting disorder Sister    BP 134/84  Pulse 78  SpO2 98%  Review of Systems denies hypoglycemia and weight change    Objective:   Physical Exam VITAL SIGNS:  See vs page GENERAL: no distress.  Obese.  In wheelchair     Assessment & Plan:  DM: uncertain control. Rash, new, uncertain etiology Chronic pain syndrome.  Uncertain drug allergy.

## 2012-10-16 ENCOUNTER — Telehealth: Payer: Self-pay | Admitting: *Deleted

## 2012-10-16 LAB — BASIC METABOLIC PANEL
BUN: 11 mg/dL (ref 6–23)
CO2: 28 mEq/L (ref 19–32)
GFR: 79.4 mL/min (ref >60.00)
Glucose, Bld: 270 mg/dL — ABNORMAL HIGH (ref 70–99)
Potassium: 3.7 mEq/L (ref 3.5–5.1)
Sodium: 137 mEq/L (ref 135–145)

## 2012-10-16 NOTE — Telephone Encounter (Signed)
Message copied by Tarri Fuller on Mon Oct 16, 2012  5:53 PM ------      Message from: [place], [place] T      Created: Mon Oct 16, 2012  5:12 PM       K+ and creatinine ok      Continue with current treatment plan.      Tereso Newcomer, PA-C        10/16/2012 5:12 PM ------

## 2012-10-16 NOTE — Telephone Encounter (Signed)
pt notified about lab results with verbal understanding today.  

## 2012-10-29 DIAGNOSIS — I509 Heart failure, unspecified: Secondary | ICD-10-CM

## 2012-10-29 DIAGNOSIS — E1165 Type 2 diabetes mellitus with hyperglycemia: Secondary | ICD-10-CM

## 2012-10-29 DIAGNOSIS — IMO0001 Reserved for inherently not codable concepts without codable children: Secondary | ICD-10-CM

## 2012-10-29 DIAGNOSIS — I5032 Chronic diastolic (congestive) heart failure: Secondary | ICD-10-CM

## 2012-11-17 ENCOUNTER — Telehealth: Payer: Self-pay | Admitting: *Deleted

## 2012-11-17 MED ORDER — OXYCODONE-ACETAMINOPHEN 10-325 MG PO TABS
1.0000 | ORAL_TABLET | ORAL | Status: DC | PRN
Start: 2012-11-17 — End: 2013-01-19

## 2012-11-17 NOTE — Telephone Encounter (Signed)
Called and lvm for pt with the message can only fill 15 tabs, no more until Dr Everardo All comes back. She can pick up rx at the office on Monday.

## 2012-11-17 NOTE — Telephone Encounter (Signed)
Davene Costain, Field Nurse with [employer], called stating pt fell 2 times and is in need of a refill of pain meds. She is requesting a refill of oxycodone-acetaminophin (percocet) 10-325 mg, 1 tablet every 4 hrs as needed for pain. Please advise.

## 2012-11-17 NOTE — Telephone Encounter (Signed)
This was taken off her med list  - we can fill 15 tabs, but not more.

## 2012-11-22 ENCOUNTER — Emergency Department (HOSPITAL_COMMUNITY): Payer: Medicare PPO

## 2012-11-22 ENCOUNTER — Encounter (HOSPITAL_COMMUNITY): Payer: Self-pay | Admitting: Radiology

## 2012-11-22 ENCOUNTER — Observation Stay (HOSPITAL_COMMUNITY)
Admission: EM | Admit: 2012-11-22 | Discharge: 2012-11-24 | Disposition: A | Discharge status: Home / Self Care | Payer: Medicare PPO | Attending: Internal Medicine | Admitting: Internal Medicine

## 2012-11-22 ENCOUNTER — Observation Stay (HOSPITAL_COMMUNITY): Payer: Medicare PPO

## 2012-11-22 DIAGNOSIS — Z6830 Body mass index (BMI) 30.0-30.9, adult: Secondary | ICD-10-CM | POA: Insufficient documentation

## 2012-11-22 DIAGNOSIS — N3000 Acute cystitis without hematuria: Secondary | ICD-10-CM

## 2012-11-22 DIAGNOSIS — K3184 Gastroparesis: Secondary | ICD-10-CM

## 2012-11-22 DIAGNOSIS — E119 Type 2 diabetes mellitus without complications: Secondary | ICD-10-CM

## 2012-11-22 DIAGNOSIS — R739 Hyperglycemia, unspecified: Secondary | ICD-10-CM

## 2012-11-22 DIAGNOSIS — R1110 Vomiting, unspecified: Secondary | ICD-10-CM

## 2012-11-22 DIAGNOSIS — Z79899 Other long term (current) drug therapy: Secondary | ICD-10-CM | POA: Insufficient documentation

## 2012-11-22 DIAGNOSIS — R1013 Epigastric pain: Secondary | ICD-10-CM | POA: Insufficient documentation

## 2012-11-22 DIAGNOSIS — K5900 Constipation, unspecified: Secondary | ICD-10-CM

## 2012-11-22 DIAGNOSIS — J449 Chronic obstructive pulmonary disease, unspecified: Secondary | ICD-10-CM | POA: Insufficient documentation

## 2012-11-22 DIAGNOSIS — R1115 Cyclical vomiting syndrome unrelated to migraine: Secondary | ICD-10-CM

## 2012-11-22 DIAGNOSIS — J4489 Other specified chronic obstructive pulmonary disease: Secondary | ICD-10-CM | POA: Insufficient documentation

## 2012-11-22 DIAGNOSIS — R112 Nausea with vomiting, unspecified: Principal | ICD-10-CM | POA: Insufficient documentation

## 2012-11-22 DIAGNOSIS — K219 Gastro-esophageal reflux disease without esophagitis: Secondary | ICD-10-CM

## 2012-11-22 DIAGNOSIS — R079 Chest pain, unspecified: Secondary | ICD-10-CM

## 2012-11-22 HISTORY — DX: Type 2 diabetes mellitus without complications: E11.9

## 2012-11-22 HISTORY — DX: Unspecified osteoarthritis, unspecified site: M19.90

## 2012-11-22 HISTORY — DX: Other chronic pain: G89.29

## 2012-11-22 HISTORY — DX: Acute myocardial infarction, unspecified: I21.9

## 2012-11-22 HISTORY — DX: Dorsalgia, unspecified: M54.9

## 2012-11-22 LAB — COMPREHENSIVE METABOLIC PANEL
ALT: 12 U/L (ref 0–35)
AST: 13 U/L (ref 0–37)
Albumin: 2.7 g/dL — ABNORMAL LOW (ref 3.5–5.2)
Alkaline Phosphatase: 90 U/L (ref 39–117)
Calcium: 8.8 mg/dL (ref 8.4–10.5)
GFR calc Af Amer: 81 mL/min — ABNORMAL LOW (ref >90)
Glucose, Bld: 390 mg/dL — ABNORMAL HIGH (ref 70–99)
Potassium: 3.6 mEq/L (ref 3.5–5.1)
Sodium: 136 mEq/L (ref 135–145)
Total Protein: 6.2 g/dL (ref 6.0–8.3)

## 2012-11-22 LAB — CBC
MCHC: 34.3 g/dL (ref 30.0–36.0)
Platelets: 235 10*3/uL (ref 150–400)
RDW: 12.4 % (ref 11.5–15.5)
WBC: 7.1 10*3/uL (ref 4.0–10.5)

## 2012-11-22 LAB — GLUCOSE, CAPILLARY
Glucose-Capillary: 351 mg/dL — ABNORMAL HIGH (ref 70–99)
Glucose-Capillary: 241 mg/dL — ABNORMAL HIGH (ref 70–99)
Glucose-Capillary: 268 mg/dL — ABNORMAL HIGH (ref 70–99)

## 2012-11-22 LAB — TROPONIN I
Troponin I: <0.3 ng/mL (ref <0.30)
Troponin I: <0.3 ng/mL (ref <0.30)

## 2012-11-22 LAB — POCT I-STAT TROPONIN I

## 2012-11-22 LAB — HEMOGLOBIN A1C: Hgb A1c MFr Bld: 8.8 % — ABNORMAL HIGH (ref <5.7)

## 2012-11-22 MED ORDER — INSULIN ASPART 100 UNIT/ML ~~LOC~~ SOLN
0.0000 [IU] | Freq: Every day | SUBCUTANEOUS | Status: DC
  Administered 2012-11-22: 3 [IU] via SUBCUTANEOUS

## 2012-11-22 MED ORDER — OXYCODONE-ACETAMINOPHEN 5-325 MG PO TABS
2.0000 | ORAL_TABLET | Freq: Four times a day (QID) | ORAL | Status: DC | PRN
  Administered 2012-11-22 – 2012-11-24 (×4): 2 via ORAL
  Filled 2012-11-22 (×3): qty 2
  Filled 2012-11-22: qty 1

## 2012-11-22 MED ORDER — ONDANSETRON HCL 4 MG PO TABS: 4.0000 mg | ORAL_TABLET | Freq: Four times a day (QID) | ORAL | Status: DC | PRN

## 2012-11-22 MED ORDER — SODIUM CHLORIDE 0.9 % IV BOLUS (SEPSIS): 1000.0000 mL | Freq: Once | INTRAVENOUS | Status: DC

## 2012-11-22 MED ORDER — INSULIN DETEMIR 100 UNIT/ML ~~LOC~~ SOLN
350.0000 [IU] | Freq: Every day | SUBCUTANEOUS | Status: DC
  Administered 2012-11-22 – 2012-11-23 (×2): 350 [IU] via SUBCUTANEOUS
  Filled 2012-11-22 (×3): qty 3.5

## 2012-11-22 MED ORDER — ONDANSETRON HCL 4 MG/2ML IJ SOLN
4.0000 mg | Freq: Once | INTRAMUSCULAR | Status: AC
  Administered 2012-11-22: 4 mg via INTRAVENOUS
  Filled 2012-11-22: qty 2

## 2012-11-22 MED ORDER — SODIUM CHLORIDE 0.9 % IJ SOLN
3.0000 mL | Freq: Two times a day (BID) | INTRAMUSCULAR | Status: DC
  Administered 2012-11-23 – 2012-11-24 (×3): 3 mL via INTRAVENOUS

## 2012-11-22 MED ORDER — ISOSORBIDE MONONITRATE ER 60 MG PO TB24
60.0000 mg | ORAL_TABLET | Freq: Every day | ORAL | Status: DC
  Administered 2012-11-22 – 2012-11-24 (×3): 60 mg via ORAL
  Filled 2012-11-22 (×4): qty 1

## 2012-11-22 MED ORDER — ASPIRIN 325 MG PO TABS: 325.0000 mg | ORAL_TABLET | ORAL | Status: DC

## 2012-11-22 MED ORDER — ACETAMINOPHEN 650 MG RE SUPP: 650.0000 mg | Freq: Four times a day (QID) | RECTAL | Status: DC | PRN

## 2012-11-22 MED ORDER — ASPIRIN EC 81 MG PO TBEC
81.0000 mg | DELAYED_RELEASE_TABLET | Freq: Every day | ORAL | Status: DC
  Administered 2012-11-23 – 2012-11-24 (×2): 81 mg via ORAL
  Filled 2012-11-22 (×2): qty 1

## 2012-11-22 MED ORDER — FENTANYL CITRATE 0.05 MG/ML IJ SOLN
100.0000 ug | Freq: Once | INTRAMUSCULAR | Status: AC
  Administered 2012-11-22: 100 ug via INTRAVENOUS
  Filled 2012-11-22: qty 2

## 2012-11-22 MED ORDER — BUPROPION HCL ER (XL) 300 MG PO TB24
300.0000 mg | ORAL_TABLET | Freq: Every day | ORAL | Status: DC
  Administered 2012-11-23 – 2012-11-24 (×2): 300 mg via ORAL
  Filled 2012-11-22 (×2): qty 1

## 2012-11-22 MED ORDER — AMLODIPINE BESYLATE 2.5 MG PO TABS
2.5000 mg | ORAL_TABLET | Freq: Every day | ORAL | Status: DC
  Administered 2012-11-23 – 2012-11-24 (×2): 2.5 mg via ORAL
  Filled 2012-11-22 (×2): qty 1

## 2012-11-22 MED ORDER — POLYETHYLENE GLYCOL 3350 17 G PO PACK
17.0000 g | PACK | Freq: Every day | ORAL | Status: DC
  Administered 2012-11-22 – 2012-11-24 (×3): 17 g via ORAL
  Filled 2012-11-22 (×4): qty 1

## 2012-11-22 MED ORDER — SODIUM CHLORIDE 0.9 % IV SOLN
INTRAVENOUS | Status: AC
  Administered 2012-11-22 – 2012-11-23 (×2): via INTRAVENOUS

## 2012-11-22 MED ORDER — LEVETIRACETAM 500 MG PO TABS
500.0000 mg | ORAL_TABLET | Freq: Two times a day (BID) | ORAL | Status: DC
  Administered 2012-11-22 – 2012-11-24 (×4): 500 mg via ORAL
  Filled 2012-11-22 (×5): qty 1

## 2012-11-22 MED ORDER — METOCLOPRAMIDE HCL 10 MG PO TABS
10.0000 mg | ORAL_TABLET | Freq: Three times a day (TID) | ORAL | Status: DC
  Administered 2012-11-22 – 2012-11-24 (×7): 10 mg via ORAL
  Filled 2012-11-22 (×11): qty 1

## 2012-11-22 MED ORDER — ONDANSETRON HCL 4 MG/2ML IJ SOLN: 4.0000 mg | Freq: Four times a day (QID) | INTRAMUSCULAR | Status: DC | PRN

## 2012-11-22 MED ORDER — ZOLPIDEM TARTRATE 5 MG PO TABS
5.0000 mg | ORAL_TABLET | Freq: Every evening | ORAL | Status: DC | PRN
Start: 2012-11-22 — End: 2012-11-24

## 2012-11-22 MED ORDER — PANTOPRAZOLE SODIUM 40 MG PO TBEC
40.0000 mg | DELAYED_RELEASE_TABLET | Freq: Every day | ORAL | Status: DC
  Administered 2012-11-22 – 2012-11-24 (×3): 40 mg via ORAL
  Filled 2012-11-22 (×2): qty 1

## 2012-11-22 MED ORDER — INSULIN ASPART 100 UNIT/ML ~~LOC~~ SOLN
0.0000 [IU] | Freq: Three times a day (TID) | SUBCUTANEOUS | Status: DC
  Administered 2012-11-22: 7 [IU] via SUBCUTANEOUS
  Administered 2012-11-23 – 2012-11-24 (×2): 3 [IU] via SUBCUTANEOUS

## 2012-11-22 MED ORDER — SIMVASTATIN 20 MG PO TABS
20.0000 mg | ORAL_TABLET | Freq: Every day | ORAL | Status: DC
  Administered 2012-11-22 – 2012-11-23 (×2): 20 mg via ORAL
  Filled 2012-11-22 (×3): qty 1

## 2012-11-22 MED ORDER — ACETAMINOPHEN 325 MG PO TABS
650.0000 mg | ORAL_TABLET | Freq: Four times a day (QID) | ORAL | Status: DC | PRN
  Administered 2012-11-23: 650 mg via ORAL
  Filled 2012-11-22: qty 2

## 2012-11-22 MED ORDER — ENOXAPARIN SODIUM 40 MG/0.4ML ~~LOC~~ SOLN
40.0000 mg | SUBCUTANEOUS | Status: DC
  Administered 2012-11-22: 40 mg via SUBCUTANEOUS
  Filled 2012-11-22 (×3): qty 0.4

## 2012-11-22 MED ORDER — DOCUSATE SODIUM 100 MG PO CAPS
100.0000 mg | ORAL_CAPSULE | Freq: Two times a day (BID) | ORAL | Status: DC
  Administered 2012-11-22 – 2012-11-24 (×4): 100 mg via ORAL
  Filled 2012-11-22 (×5): qty 1

## 2012-11-22 MED ORDER — BUDESONIDE-FORMOTEROL FUMARATE 160-4.5 MCG/ACT IN AERO
2.0000 | INHALATION_SPRAY | Freq: Two times a day (BID) | RESPIRATORY_TRACT | Status: DC
  Administered 2012-11-22 – 2012-11-24 (×4): 2 via RESPIRATORY_TRACT
  Filled 2012-11-22: qty 6

## 2012-11-22 MED ORDER — NITROGLYCERIN 0.4 MG SL SUBL: 0.4000 mg | SUBLINGUAL_TABLET | SUBLINGUAL | Status: DC | PRN

## 2012-11-22 MED ORDER — ALBUTEROL SULFATE HFA 108 (90 BASE) MCG/ACT IN AERS
2.0000 | INHALATION_SPRAY | Freq: Every morning | RESPIRATORY_TRACT | Status: DC
  Administered 2012-11-23 – 2012-11-24 (×2): 2 via RESPIRATORY_TRACT
  Filled 2012-11-22: qty 6.7

## 2012-11-22 NOTE — H&P (Signed)
[facility] History and Physical  [place] NWG:[id number] DOB: February 22, 1941 DOA: 11/22/2012  Referring physician: er PCP: Romero Belling, MD  Specialists:   Chief Complaint: N/V  HPI: [name] is a 72 y.o. female  Who present to the ER with epigastric pain, N/V- pain described as burning.  She ran out of her pain pills several days ago and her PCP is out of town.  She has been on pain pills most her life and then had a reaction to hydrocodone- was switched to oxycodone and did fine-- but then ran out.  Says for 2 days she has been unable to keep anything down.  No sick contacts.   No BM x 1 week   In the Er, her labs were grossly unremarkable except and elevated BS of 350 (last HgbA1C was 9.7).  Hopsitalist were asked to observe for high BS with N/V  Review of Systems: all systems reviewed, negative unless stated above   Past Medical History  Diagnosis Date  . COLONIC POLYPS 09/13/2007  . DIABETES MELLITUS, TYPE I 10/29/2006  . HYPERCHOLESTEROLEMIA 02/25/2009  . Tobacco abuse 02/22/2008  . DEPRESSION 02/22/2008  . HYPERTENSION 12/20/2008  . 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 ALLERGIC RHINITIS 10/29/2006  . COPD 04/22/2009  . GERD 10/29/2006  . CIRRHOSIS 10/29/2006  . DEGENERATIVE JOINT DISEASE 06/15/2007  . OSTEOPOROSIS 10/29/2006  . OSA (obstructive sleep apnea) 05/07/2009  . CHEST PAIN 02/22/2008    LHC in 7/05 and 1/11: normal; Myoview in 11/10 that demonstrated an EF of 51% and slight reversible anterior and septal defect of borderline significance (false + test);  Echo 04/2009:  Mild LVH, EF 55-60%, Gr 1 DD, MAC.    [name] ASYMPTOMATIC POSTMENOPAUSAL STATUS 02/22/2008  . Gastroparesis   . Fatty liver   . Gastritis   . Diverticulosis   . Cholelithiasis   . CHF (congestive heart failure)   . SEIZURE DISORDER 01/06/2010    pt not sure  . Headache(784.0)   . Migraine    Past Surgical History  Procedure Laterality Date  . Appendectomy     . Abdominal hysterectomy  1980's  . (r) leg broken      when she was 18  . Catartact surgery    . Breast surgery  1986 or 1987    Breast reduction  . Esophagogastroduodenoscopy  08/17/2011    Procedure: ESOPHAGOGASTRODUODENOSCOPY (EGD);  Surgeon: Hart Carwin, MD;  Location: [facility];  Service: Endoscopy;  Laterality: N/A;  . Colonoscopy  08/17/2011    Procedure: COLONOSCOPY;  Surgeon: Hart Carwin, MD;  Location: [facility];  Service: Endoscopy;  Laterality: N/A;   Social History:  reports that she has been smoking.  She does not have any smokeless tobacco history on file. She reports that she does not drink alcohol or use illicit drugs.   Allergies  Allergen Reactions  . Hydrocodone Hives  . Lisinopril     REACTION: Cough  . Pioglitazone     REACTION: Edema  . Varenicline Tartrate     REACTION: bad dreams  . Oxycontin (Oxycodone Hcl) Rash    Family History  Problem Relation Age of Onset  . Ovarian cancer Mother   . Diabetes Mother   . Diabetes Other   . Heart disease Mother   . Heart disease Father   . Heart disease Maternal Grandmother   . Heart disease Sister   . Colon cancer Mother   . Clotting disorder Sister  Prior to Admission medications   Medication Sig Start Date End Date Taking? Authorizing Provider  amLODipine (NORVASC) 2.5 MG tablet Take 2.5 mg by mouth daily.  06/28/12  Yes Historical Provider, MD  aspirin 81 MG tablet Take 81 mg by mouth daily.    Yes Historical Provider, MD  budesonide-formoterol (SYMBICORT) 160-4.5 MCG/ACT inhaler Inhale 2 puffs into the lungs 2 (two) times daily.    Yes Historical Provider, MD  buPROPion (WELLBUTRIN XL) 300 MG 24 hr tablet Take 300 mg by mouth daily.   Yes Historical Provider, MD  furosemide (LASIX) 40 MG tablet Take 40 mg by mouth 2 (two) times daily.   Yes Historical Provider, MD  insulin glargine (LANTUS) 100 UNIT/ML injection Inject 300 Units into the skin daily. 08/14/12  Yes Romero Belling, MD  Insulin  Pen Needle 32G X 4 MM MISC USE AS DIRECTED 1 TIME DAILY 08/14/12  Yes Romero Belling, MD  isosorbide mononitrate (IMDUR) 60 MG 24 hr tablet Take 60 mg by mouth daily. 08/09/12  Yes Laurey Morale, MD  LEVEMIR FLEXPEN 100 UNIT/ML injection Inject 350 Units into the skin daily.  07/16/12  Yes Historical Provider, MD  levETIRAcetam (KEPPRA) 500 MG tablet Take 500 mg by mouth 2 (two) times daily.   Yes Historical Provider, MD  losartan-hydrochlorothiazide (HYZAAR) 100-25 MG per tablet Take 1 tablet by mouth daily.    Yes Historical Provider, MD  metoCLOPramide (REGLAN) 10 MG tablet Take 10 mg by mouth every 6 (six) hours as needed (nausea). For nausea/vomiting   Yes Historical Provider, MD  Naproxen Sodium (ALEVE PO) Take 1 tablet by mouth 3 (three) times daily as needed (pain).   Yes Historical Provider, MD  nitroGLYCERIN (NITROSTAT) 0.4 MG SL tablet Place 0.4 mg under the tongue every 5 (five) minutes as needed for chest pain. 07/17/12  Yes Romero Belling, MD  omeprazole (PRILOSEC) 40 MG capsule Take 40 mg by mouth daily. 07/17/12  Yes Romero Belling, MD  oxyCODONE-acetaminophen (PERCOCET) 10-325 MG per tablet Take 1 tablet by mouth every 4 (four) hours as needed for pain. 11/17/12  Yes Carlus Pavlov, MD  potassium chloride SA (K-DUR,KLOR-CON) 20 MEQ tablet Take 20 mEq by mouth daily.   Yes Historical Provider, MD  Probiotic Product (ALIGN) 4 MG CAPS Take 1 capsule by mouth daily. 08/17/11  Yes Hart Carwin, MD  simvastatin (ZOCOR) 40 MG tablet Take 0.5 tablets (20 mg total) by mouth at bedtime. 07/26/12  Yes Beatrice Lecher, PA-C  triamcinolone cream (KENALOG) 0.1 % Apply 1 application topically 3 (three) times daily as needed. for itching 10/13/12  Yes Romero Belling, MD  zolpidem (AMBIEN) 5 MG tablet Take 5 mg by mouth at bedtime as needed for sleep. For sleep   Yes Historical Provider, MD  albuterol (PROVENTIL HFA;VENTOLIN HFA) 108 (90 BASE) MCG/ACT inhaler Inhale 2 puffs into the lungs every morning. May take 2  puffs every 4-6 hours if needed for wheezing    Historical Provider, MD  HYDROcodone-acetaminophen [facility]) 10-325 MG per tablet  10/10/12   Historical Provider, MD   Physical Exam: Filed Vitals:   11/22/12 1317  BP: 131/88  Temp: 99.4 F (37.4 C)  Resp: 24    General: NAD, obese  Neck: Thick, JVP difficult, no thyromegaly or thyroid nodule.  Lungs: Clear to auscultation bilaterally with normal respiratory effort.  CV: Nondisplaced PMI. Heart regular S1/S2, no S3/S4, no murmur. 1+ edema 1/2 to knees bilaterally. No carotid bruit. Normal pedal pulses.  Abdomen: Soft, nontender,  no hepatosplenomegaly, no distention.  Neurologic: Alert and oriented x 3.  Psych: Normal affect.  Extremities: No clubbing or cyanosis  Labs on Admission:  Basic Metabolic Panel:  Recent Labs Lab 11/22/12 1348  NA 136  K 3.6  CL 99  CO2 29  GLUCOSE 390*  BUN 8  CREATININE 0.82  CALCIUM 8.8   Liver Function Tests:  Recent Labs Lab 11/22/12 1348  AST 13  ALT 12  ALKPHOS 90  BILITOT 0.4  PROT 6.2  ALBUMIN 2.7*    Recent Labs Lab 11/22/12 1348  LIPASE 32   No results found for this basename: AMMONIA,  in the last 168 hours CBC:  Recent Labs Lab 11/22/12 1348  WBC 7.1  HGB 13.6  HCT 39.6  MCV 96.1  PLT 235   Cardiac Enzymes: No results found for this basename: CKTOTAL, CKMB, CKMBINDEX, TROPONINI,  in the last 168 hours  BNP (last 3 results)  Recent Labs  06/26/12 2228 08/24/12 1402  PROBNP 107.4 37.0   CBG:  Recent Labs Lab 11/22/12 1329  GLUCAP 351*    Radiological Exams on Admission: Dg Chest 2 View  11/22/2012   *RADIOLOGY REPORT*  Clinical Data: Chest pain, short of breath, cough and fever, smoking history  CHEST - 2 VIEW  Comparison: CT chest of 06/29/2012 and chest x-ray of 06/26/2012  Findings: No active infiltrate or effusion is seen.  Mild cardiomegaly is stable.  No bony abnormality is seen.  IMPRESSION: Stable mild cardiomegaly.  No active lung disease.    Original Report Authenticated By: Dwyane Dee, M.D.    EKG: Independently reviewed. NSR per ER  Assessment/Plan Active Problems:   DM (diabetes mellitus)   Nausea & vomiting   1. N/V- gastritis vs gastroparesis from uncontrolled DM- BS elevated, check abd x ray as this not done in ER- suspect constipation;  patient also has been out of medications (pain)- suspect withdrawal; change reglan to scheduled not PRN (not taking at home); lipase ok 2. DM- SSI, check Hgb A1C   Code Status: full Family Communication: patient at bedside Disposition Plan: obs  Time spent: 70 min  Suzzette Gasparro [facility] Pager [phone]  If 7PM-7AM, please contact night-coverage [email or web address] Password [facility] 11/22/2012, 3:38 PM

## 2012-11-22 NOTE — ED Notes (Signed)
Patient transported to X-ray 

## 2012-11-22 NOTE — ED Notes (Signed)
MD at bedside. 

## 2012-11-22 NOTE — Progress Notes (Signed)
Called to get report form ED, nurse not ready, will call back.

## 2012-11-22 NOTE — ED Notes (Signed)
Pt presents with and pain n/v/d followed by CP pt describes as "burning" pt states that she has been taking aleve in place of her hydrocodone.  Pt has pain upon palpation to chest wall. Condition is worse with coughing, respiration and when swallowing

## 2012-11-23 DIAGNOSIS — K5900 Constipation, unspecified: Secondary | ICD-10-CM

## 2012-11-23 DIAGNOSIS — R079 Chest pain, unspecified: Secondary | ICD-10-CM

## 2012-11-23 DIAGNOSIS — N3000 Acute cystitis without hematuria: Secondary | ICD-10-CM

## 2012-11-23 LAB — MAGNESIUM: Magnesium: 1.9 mg/dL (ref 1.5–2.5)

## 2012-11-23 LAB — BASIC METABOLIC PANEL
BUN: 8 mg/dL (ref 6–23)
Calcium: 8.7 mg/dL (ref 8.4–10.5)
Creatinine, Ser: 0.91 mg/dL (ref 0.50–1.10)
GFR calc Af Amer: 72 mL/min — ABNORMAL LOW (ref >90)

## 2012-11-23 LAB — GLUCOSE, CAPILLARY

## 2012-11-23 MED ORDER — LUBIPROSTONE 24 MCG PO CAPS
24.0000 ug | ORAL_CAPSULE | Freq: Two times a day (BID) | ORAL | Status: DC
  Administered 2012-11-23 – 2012-11-24 (×2): 24 ug via ORAL
  Filled 2012-11-23 (×4): qty 1

## 2012-11-23 MED ORDER — ENOXAPARIN SODIUM 80 MG/0.8ML ~~LOC~~ SOLN
80.0000 mg | SUBCUTANEOUS | Status: DC
  Administered 2012-11-23: 80 mg via SUBCUTANEOUS
  Filled 2012-11-23 (×2): qty 0.8

## 2012-11-23 MED ORDER — MAGNESIUM CITRATE PO SOLN
1.0000 | Freq: Once | ORAL | Status: AC
  Administered 2012-11-23: 1 via ORAL
  Filled 2012-11-23: qty 296

## 2012-11-23 MED ORDER — FLEET ENEMA 7-19 GM/118ML RE ENEM
1.0000 | ENEMA | Freq: Once | RECTAL | Status: AC
  Administered 2012-11-23: 1 via RECTAL
  Filled 2012-11-23: qty 1

## 2012-11-23 MED ORDER — GI COCKTAIL ~~LOC~~: 30.0000 mL | Freq: Three times a day (TID) | ORAL | Status: DC | PRN

## 2012-11-23 NOTE — Progress Notes (Signed)
Pharmacy Clarification  Adjusted patients lovenox for dvt prophylaxis to appropriate dose of 0.5 mg/kg q24h in patient with BMI >30 and NL renal function (CrCl ~100 ml/min).  CBC wnl at baseline.  New dose is 80 mg sq q24h.  Further adjustments per MD  Thanks,  Marchelle Folks. Illene Bolus, PharmD, BCPS Clinical Pharmacist Pager: [phone] Pharmacy: [phone] 11/23/2012 2:59 PM

## 2012-11-23 NOTE — Evaluation (Signed)
Physical Therapy Evaluation Patient Details Name: [name] MRN: [id number] DOB: 1940-07-21 Today's Date: 11/23/2012 Time: 7829-5621 PT Time Calculation (min): 26 min  PT Assessment / Plan / Recommendation History of Present Illness  72 y.o. female admitted to [facility] on 11/22/12 due to abdominal pain, N/V.  Pt reports she ran out of pain pills for ~ 2 weeks.    Clinical Impression  This pt is close to her normal baseline of transferring from bed to electric WC without assistance.  PT to follow acutely and recommend resumption of HH services at discharge.  Will assess short distance gait with SPC next session if pt still here.      PT Assessment  Patient needs continued PT services    Follow Up Recommendations  Home health PT;Supervision - Intermittent (resume all HH services- she reports being active with [facility])    Does the patient have the potential to tolerate intense rehabilitation     NA  Barriers to Discharge Decreased caregiver support Pt does not have 24 hour assist, but has been managing at home with intermittent home care and neighbor support.      Equipment Recommendations  3in1 (PT);Other (comment) (sh will need a bariatric 3-in-1)    Recommendations for Other Services   None  Frequency Min 3X/week    Precautions / Restrictions Precautions Precautions: Fall Precaution Comments: limited mobility PTA   Pertinent Vitals/Pain See vitals flow sheet.       Mobility  Bed Mobility Bed Mobility: Not assessed (pt already seated EOB) Transfers Transfers: Sit to Stand;Stand Pivot Transfers;Stand to Sit Sit to Stand: 4: Min guard;With upper extremity assist;With armrests;From bed Stand to Sit: 4: Min guard;With upper extremity assist;With armrests;To chair/3-in-1 Stand Pivot Transfers: 4: Min guard;With armrests Details for Transfer Assistance: min guard assist for safety to go from bed to recliner chair on pt's left side.   Ambulation/Gait Ambulation/Gait  Assistance: Not tested (comment) (pt not feeling well (HA, nauseated, pain), politely declined)        PT Diagnosis: Difficulty walking;Abnormality of gait;Generalized weakness  PT Problem List: Decreased strength;Decreased activity tolerance;Decreased balance;Decreased mobility;Obesity;Pain PT Treatment Interventions: DME instruction;Gait training;Stair training;Functional mobility training;Therapeutic activities;Therapeutic exercise;Balance training;Neuromuscular re-education;Patient/family education;Modalities     PT Goals(Current goals can be found in the care plan section) Acute Rehab PT Goals Patient Stated Goal: to decrease pain, get back home, have a BM (I think it will make me feel better) PT Goal Formulation: With patient Time For Goal Achievement: 12/07/12 Potential to Achieve Goals: Good  Visit Information  Last PT Received On: 11/23/12 History of Present Illness: 72 y.o. female admitted to [facility] on 11/22/12 due to abdominal pain, N/V.  Pt reports she ran out of pain pills for ~ 2 weeks.         Prior Functioning  Home Living Family/patient expects to be discharged to:: Private residence Living Arrangements: Alone Available Help at Discharge: Personal care attendant (5 days per week 2 hour M-Fri) Type of Home: House Home Access: Stairs to enter [employer] of Steps: 1 Entrance Stairs-Rails: None Home Layout: One level Home Equipment: Walker - 2 wheels;Cane - single point;Wheelchair - power;Bedside commode;Shower seat;Grab bars - tub/shower;Hand held shower head;Hospital bed (pt reports her BSC is broken) Additional Comments: active with HHPT, HH aide, has someone bring meals (through mobile meals).  HH services through [facility] per pt report.   Prior Function Level of Independence: Needs assistance Gait / Transfers Assistance Needed: min assist with cane and caregiver ADL's / Homemaking  Assistance Needed: total assist  Communication / Swallowing Assistance Needed:  none Comments: pt's neighbors make sure she has everything within reach before she goes to bed at night and the portable commode is right next to the bed.  Pt reports she mostly transfers into her electric WC and uses that to get around the house with the exception of the bathroom (she cannot fit the WC into the bathroom).  Uses cane for transfers and short distance gait as needed around the home where her WC doesn't fit.   Communication Communication: No difficulties Dominant Hand: Right    Cognition  Cognition Arousal/Alertness: Lethargic Behavior During Therapy: Flat affect Overall Cognitive Status: Difficult to assess Memory: Decreased short-term memory (can't recall what inhalers she is on at home with RT asked) Difficult to assess due to:  (no one present to report baseline)    Extremity/Trunk Assessment Upper Extremity Assessment Upper Extremity Assessment: Generalized weakness Lower Extremity Assessment Lower Extremity Assessment: Generalized weakness Cervical / Trunk Assessment Cervical / Trunk Assessment: Normal   Balance Balance Balance Assessed: Yes Static Sitting Balance Static Sitting - Balance Support: No upper extremity supported;Feet supported Static Sitting - Level of Assistance: 7: Independent Dynamic Standing Balance Dynamic Standing - Balance Support: Left upper extremity supported;During functional activity Dynamic Standing - Level of Assistance: 4: Min assist Dynamic Standing - Comments: min guard assist while moving bed to chair.  Left upper extremity supported during transfer  End of Session PT - End of Session Activity Tolerance: Patient limited by fatigue;Patient limited by pain Patient left: in chair;with call bell/phone within reach  GP Functional Assessment Tool Used: clinician expeirence.   Functional Limitation: Mobility: Walking and moving around Mobility: Walking and Moving Around Current Status [phone]): At least 1 percent but less than 20 percent  impaired, limited or restricted Mobility: Walking and Moving Around Goal Status [phone]): 0 percent impaired, limited or restricted   Hollie Bartus B. Aira Sallade, PT, DPT [phone]   11/23/2012, 10:37 AM

## 2012-11-23 NOTE — Progress Notes (Signed)
[facility] PROGRESS NOTE  Maine ZOX:[id number] DOB: November 04, 1940 DOA: 11/22/2012 PCP: Romero Belling, MD  Assessment/Plan: N/V - suspect gastritis vs gastroparesis from uncontrolled DM vs constipation - Pt admits to not stooling x 4-5 days - Cont scheduled reglan - Will add mg citrate with fleets enema - abd xray personally reviewed - difficult study given morbid obesity, but evidence of mod amounts of stool, at least through the ascending colon is noted  DM - Cont SSI  Morbid obesity - stable  Chronic pain: - On opioids - Would ensure bowel regimen as long as she is on chronic pain meds   Code Status: Full Family Communication: Pt in room (indicate person spoken with, relationship, and if by phone, the number) Disposition Plan: Pending  HPI/Subjective: Complains of mild epigastric pain. Admits to not stooling x 4-5 days.  Objective: Filed Vitals:   11/23/12 0845 11/23/12 0848 11/23/12 1002 11/23/12 1151  BP:   110/60 125/86  Pulse:    65  Temp:    98 F (36.7 C)  TempSrc:    Oral  Resp:    16  Height:      Weight:      SpO2: 95% 95%  90%    Intake/Output Summary (Last 24 hours) at 11/23/12 1233 Last data filed at 11/23/12 0537  Gross per 24 hour  Intake 1948.75 ml  Output      0 ml  Net 1948.75 ml   Filed Weights   11/22/12 1317 11/22/12 1702  Weight: 171.913 kg (379 lb) 167.468 kg (369 lb 3.2 oz)    Exam:   General:  Lethargic, arousable, in nad  Cardiovascular: regular, s1, s2  Respiratory: normal resp effort, no wheezing  Abdomen: morbidly obese, decreased BS, generally tender w/o rebound  Musculoskeletal: perfused, no clubbing   Data Reviewed: Basic Metabolic Panel:  Recent Labs Lab 11/22/12 1348 11/23/12 0832  NA 136 139  K 3.6 3.6  CL 99 102  CO2 29 27  GLUCOSE 390* 120*  BUN 8 8  CREATININE 0.82 0.91  CALCIUM 8.8 8.7  MG  --  1.9   Liver Function Tests:  Recent Labs Lab 11/22/12 1348  AST 13  ALT  12  ALKPHOS 90  BILITOT 0.4  PROT 6.2  ALBUMIN 2.7*    Recent Labs Lab 11/22/12 1348  LIPASE 32   No results found for this basename: AMMONIA,  in the last 168 hours CBC:  Recent Labs Lab 11/22/12 1348  WBC 7.1  HGB 13.6  HCT 39.6  MCV 96.1  PLT 235   Cardiac Enzymes:  Recent Labs Lab 11/22/12 1725 11/22/12 2215 11/23/12 0355  TROPONINI <0.30 <0.30 <0.30   BNP (last 3 results)  Recent Labs  06/26/12 2228 08/24/12 1402  PROBNP 107.4 37.0   CBG:  Recent Labs Lab 11/22/12 1329 11/22/12 1649 11/22/12 2024 11/23/12 0814 11/23/12 1124  GLUCAP 351* 241* 268* 98 85    No results found for this or any previous visit (from the past 240 hour(s)).   Studies: Dg Chest 2 View  11/22/2012   *RADIOLOGY REPORT*  Clinical Data: Chest pain, short of breath, cough and fever, smoking history  CHEST - 2 VIEW  Comparison: CT chest of 06/29/2012 and chest x-ray of 06/26/2012  Findings: No active infiltrate or effusion is seen.  Mild cardiomegaly is stable.  No bony abnormality is seen.  IMPRESSION: Stable mild cardiomegaly.  No active lung disease.   Original Report Authenticated By: Dwyane Dee, M.D.  Dg Abd 2 Views  11/22/2012   *RADIOLOGY REPORT*  Clinical Data: Nausea, vomiting for 2 days  ABDOMEN - 2 VIEW  Comparison:  CT abdomen pelvis of 01/18/2011.  Findings: Supine and left lateral decubitus films of the abdomen show no bowel obstruction.  No free air is seen on the decubitus films.  A moderate amount of feces is noted throughout the colon.  IMPRESSION: No bowel obstruction.  No free air.   Original Report Authenticated By: Dwyane Dee, M.D.    Scheduled Meds: . albuterol  2 puff Inhalation q morning - 10a  . amLODipine  2.5 mg Oral Daily  . aspirin EC  81 mg Oral Daily  . budesonide-formoterol  2 puff Inhalation BID  . buPROPion  300 mg Oral Daily  . docusate sodium  100 mg Oral BID  . enoxaparin (LOVENOX) injection  40 mg Subcutaneous Q24H  . insulin aspart   0-20 Units Subcutaneous TID WC  . insulin aspart  0-5 Units Subcutaneous QHS  . insulin detemir  350 Units Subcutaneous QHS  . isosorbide mononitrate  60 mg Oral Daily  . levETIRAcetam  500 mg Oral BID  . magnesium citrate  1 Bottle Oral Once  . metoCLOPramide  10 mg Oral TID AC & HS  . pantoprazole  40 mg Oral Daily  . polyethylene glycol  17 g Oral Daily  . simvastatin  20 mg Oral QHS  . sodium chloride  1,000 mL Intravenous Once  . sodium chloride  1,000 mL Intravenous Once  . sodium chloride  3 mL Intravenous Q12H  . sodium phosphate  1 enema Rectal Once   Continuous Infusions:   Active Problems:   DM (diabetes mellitus)   Nausea & vomiting    Time spent:    Tracyann Duffell K  [facility] Pager [phone]. If 7PM-7AM, please contact night-coverage at [email or web address], password [facility] 11/23/2012, 12:33 PM  LOS: 1 day

## 2012-11-23 NOTE — Progress Notes (Signed)
Clinical Child psychotherapist (CSW) received a referral however pt appears to have support at home via [facility] agency. Please consult PT/OT to determine appropriate care needed at dc. CSW signing off, will assess if PT/OT recommending SNF.  Theresia Bough, MSW, Theresia Majors [phone]

## 2012-11-24 LAB — GLUCOSE, CAPILLARY: Glucose-Capillary: 70 mg/dL (ref 70–99)

## 2012-11-24 MED ORDER — DSS 100 MG PO CAPS: 100.0000 mg | ORAL_CAPSULE | Freq: Two times a day (BID) | ORAL | Status: DC

## 2012-11-24 NOTE — Discharge Summary (Signed)
Physician Discharge Summary  [name] WUJ:[id number] DOB: [birth year] DOA: 11/22/2012  PCP: Kylie Belling, MD  Admit date: 11/22/2012 Discharge date: 11/24/2012  Time spent: 30 minutes  Recommendations for Outpatient Follow-up:  1. Follow up with PCP in 1-2 weeks 2. Consider long-term management for chronic constipation 3. Please verify patient's medications and doses  Discharge Diagnoses:  Active Problems:   DM (diabetes mellitus)   Nausea & vomiting   Discharge Condition: Improved  Diet recommendation: Diabetic, high fiber  Filed Weights   11/22/12 1317 11/22/12 1702  Weight: 171.913 kg (379 lb) 167.468 kg (369 lb 3.2 oz)    History of present illness:  [name] is a 72 y.o. female  Who present to the ER with epigastric pain, N/V- pain described as burning. She ran out of her pain pills several days ago and her PCP is out of town. She has been on pain pills most her life and then had a reaction to hydrocodone- was switched to oxycodone and did fine-- but then ran out. Says for 2 days she has been unable to keep anything down. No sick contacts. No BM x 1 week   Hospital Course:  The patient was admitted to the inpatient service where cardiac enzymes were found to be negative x 4. The patient was started on a bowel regimen, including fleets enema, magnesium citrate, and amitiza with eventual result. The patient remained medically stable for outpatient follow up.  Discharge Exam: Filed Vitals:   11/23/12 2137 11/23/12 2307 11/24/12 0326 11/24/12 0944  BP: 97/40 130/40 131/51   Pulse: 73 74 71   Temp: 98.1 F (36.7 C)  98 F (36.7 C)   TempSrc: Oral  Oral   Resp: 20 18 20    Height:      Weight:      SpO2: 98%  99% 95%    General: Awake, in nad Cardiovascular: regular, s1, s2 Respiratory: normal resp effort, no wheezing  Discharge Instructions       Future Appointments Provider Department Dept Phone   01/19/2013 4:00 PM Kylie Belling, MD  [facility] PRIMARY CARE ENDOCRINOLOGY [phone]       Medication List         albuterol 108 (90 BASE) MCG/ACT inhaler  Commonly known as:  PROVENTIL HFA;VENTOLIN HFA  Inhale 2 puffs into the lungs 2 (two) times daily as needed for wheezing.     ALEVE PO  Take 1 tablet by mouth 3 (three) times daily as needed (pain).     ALIGN 4 MG Caps  Take 1 capsule by mouth daily.     amLODipine 2.5 MG tablet  Commonly known as:  NORVASC  Take 2.5 mg by mouth daily.     aspirin EC 81 MG tablet  Take 81 mg by mouth daily.     budesonide-formoterol 160-4.5 MCG/ACT inhaler  Commonly known as:  SYMBICORT  Inhale 2 puffs into the lungs 2 (two) times daily.     buPROPion 300 MG 24 hr tablet  Commonly known as:  WELLBUTRIN XL  Take 300 mg by mouth daily.     DSS 100 MG Caps  Take 100 mg by mouth 2 (two) times daily.     furosemide 40 MG tablet  Commonly known as:  LASIX  Take 20-40 mg by mouth 2 (two) times daily. Takes 1 tablet (40mg ) in the morning and 0.5 tablet (20mg ) in the evening     Insulin Pen Needle 32G X 4 MM Misc  USE AS  DIRECTED 1 TIME DAILY     isosorbide mononitrate 60 MG 24 hr tablet  Commonly known as:  IMDUR  Take 60 mg by mouth daily.     LEVEMIR FLEXPEN 100 UNIT/ML Sopn  Generic drug:  Insulin Detemir  Inject 600 Units into the skin daily.     levETIRAcetam 500 MG tablet  Commonly known as:  KEPPRA  Take 500 mg by mouth 2 (two) times daily.     losartan-hydrochlorothiazide 100-25 MG per tablet  Commonly known as:  HYZAAR  Take 1 tablet by mouth daily.     metoCLOPramide 10 MG tablet  Commonly known as:  REGLAN  Take 10 mg by mouth every 6 (six) hours as needed (nausea). For nausea/vomiting     nitroGLYCERIN 0.4 MG SL tablet  Commonly known as:  NITROSTAT  Place 0.4 mg under the tongue every 5 (five) minutes as needed for chest pain.     omeprazole 40 MG capsule  Commonly known as:  PRILOSEC  Take 40 mg by mouth daily.     oxyCODONE-acetaminophen  10-325 MG per tablet  Commonly known as:  PERCOCET  Take 1 tablet by mouth every 4 (four) hours as needed for pain.     potassium chloride SA 20 MEQ tablet  Commonly known as:  K-DUR,KLOR-CON  Take 20 mEq by mouth daily.     simvastatin 40 MG tablet  Commonly known as:  ZOCOR  Take 0.5 tablets (20 mg total) by mouth at bedtime.     triamcinolone cream 0.1 %  Commonly known as:  KENALOG  Apply 1 application topically 3 (three) times daily as needed. for itching     zolpidem 5 MG tablet  Commonly known as:  AMBIEN  Take 5 mg by mouth at bedtime as needed for sleep. For sleep       Allergies  Allergen Reactions  . Hydrocodone Hives  . Lisinopril     REACTION: Cough  . Pioglitazone     REACTION: Edema  . Varenicline Tartrate     REACTION: bad dreams   Follow-up Information   Follow up with Kylie Belling, MD In 1 week.   Contact information:   [address] [place] [address] [phone]        The results of significant diagnostics from this hospitalization (including imaging, microbiology, ancillary and laboratory) are listed below for reference.    Significant Diagnostic Studies: Dg Chest 2 View  11/22/2012   *RADIOLOGY REPORT*  Clinical Data: Chest pain, short of breath, cough and fever, smoking history  CHEST - 2 VIEW  Comparison: CT chest of 06/29/2012 and chest x-ray of 06/26/2012  Findings: No active infiltrate or effusion is seen.  Mild cardiomegaly is stable.  No bony abnormality is seen.  IMPRESSION: Stable mild cardiomegaly.  No active lung disease.   Original Report Authenticated By: Dwyane Dee, M.D.   Dg Abd 2 Views  11/22/2012   *RADIOLOGY REPORT*  Clinical Data: Nausea, vomiting for 2 days  ABDOMEN - 2 VIEW  Comparison:  CT abdomen pelvis of 01/18/2011.  Findings: Supine and left lateral decubitus films of the abdomen show no bowel obstruction.  No free air is seen on the decubitus films.  A moderate amount of feces is noted throughout the  colon.  IMPRESSION: No bowel obstruction.  No free air.   Original Report Authenticated By: Dwyane Dee, M.D.    Microbiology: No results found for this or any previous visit (from the past 240  hour(s)).   Labs: Basic Metabolic Panel:  Recent Labs Lab 11/22/12 1348 11/23/12 0832  NA 136 139  K 3.6 3.6  CL 99 102  CO2 29 27  GLUCOSE 390* 120*  BUN 8 8  CREATININE 0.82 0.91  CALCIUM 8.8 8.7  MG  --  1.9   Liver Function Tests:  Recent Labs Lab 11/22/12 1348  AST 13  ALT 12  ALKPHOS 90  BILITOT 0.4  PROT 6.2  ALBUMIN 2.7*    Recent Labs Lab 11/22/12 1348  LIPASE 32   No results found for this basename: AMMONIA,  in the last 168 hours CBC:  Recent Labs Lab 11/22/12 1348  WBC 7.1  HGB 13.6  HCT 39.6  MCV 96.1  PLT 235   Cardiac Enzymes:  Recent Labs Lab 11/22/12 1725 11/22/12 2215 11/23/12 0355  TROPONINI <0.30 <0.30 <0.30   BNP: BNP (last 3 results)  Recent Labs  06/26/12 2228 08/24/12 1402  PROBNP 107.4 37.0   CBG:  Recent Labs Lab 11/23/12 0814 11/23/12 1124 11/23/12 1651 11/23/12 2135 11/24/12 0807  GLUCAP 98 85 124* 132* 70       Signed:  Londynn Sonoda K  [facility] 11/24/2012, 10:43 AM

## 2012-11-24 NOTE — Care Management (Signed)
CM received a call from [employer] stating that insurance will not be able to cover the 3n1 at this time due to she has had a 3n1 in the past and she may have to pay out of pocket.  Pt was d/c before delivery was made. [employer] to contact pt. No further needs from CM at this time. Gala Lewandowsky, RN,BSN [phone]

## 2012-11-24 NOTE — Care Management (Signed)
Order placed for a bariatric bedside commode. CM did call [employer] to see if pt was able to get a new one. Information faxed to [employer] and they will contact Care Manager in reference to if pt is eligible for a new one and time of delivery. Care Manager will continue to monitor. Gala Lewandowsky, RN,BSN [phone]

## 2012-11-24 NOTE — Progress Notes (Signed)
D/c information reviewed with patient, d/c iv, d/c home with son.  Patient stated that she understood her d/c paperwork.

## 2012-11-24 NOTE — Progress Notes (Signed)
[facility]  Patient Status: Active (receiving services up to time of hospitalization)  [facility] is providing the following services: PT  If patient discharges after hours, please call [phone].   [name] 11/24/2012, 11:29 AM

## 2012-11-28 ENCOUNTER — Telehealth: Payer: Self-pay

## 2012-11-28 NOTE — Telephone Encounter (Signed)
Pt's son came by to pick up rx for Percocet, rx was given, per Dr.Ellison pt needs to make a office/hospital follow-up visit
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
## 2012-12-25 DIAGNOSIS — J449 Chronic obstructive pulmonary disease, unspecified: Secondary | ICD-10-CM

## 2012-12-25 DIAGNOSIS — F329 Major depressive disorder, single episode, unspecified: Secondary | ICD-10-CM

## 2012-12-25 DIAGNOSIS — I5032 Chronic diastolic (congestive) heart failure: Secondary | ICD-10-CM

## 2012-12-25 DIAGNOSIS — E119 Type 2 diabetes mellitus without complications: Secondary | ICD-10-CM

## 2012-12-25 DIAGNOSIS — I509 Heart failure, unspecified: Secondary | ICD-10-CM

## 2012-12-29 ENCOUNTER — Telehealth: Payer: Self-pay

## 2012-12-29 NOTE — Telephone Encounter (Signed)
ok 

## 2012-12-29 NOTE — Telephone Encounter (Signed)
[name] with [facility] [facility] de[facility] left voicemail stating pt is getting confused with her meds, needs order to get assistance with her pill box, [phone]

## 2012-12-29 NOTE — Telephone Encounter (Signed)
Marcelino Duster advised and will fax med list to [phone]

## 2013-01-01 ENCOUNTER — Other Ambulatory Visit: Payer: Self-pay | Admitting: *Deleted

## 2013-01-01 ENCOUNTER — Other Ambulatory Visit: Payer: Self-pay | Admitting: Endocrinology

## 2013-01-01 DIAGNOSIS — R0602 Shortness of breath: Secondary | ICD-10-CM

## 2013-01-01 DIAGNOSIS — R609 Edema, unspecified: Secondary | ICD-10-CM

## 2013-01-01 DIAGNOSIS — R079 Chest pain, unspecified: Secondary | ICD-10-CM

## 2013-01-01 NOTE — Telephone Encounter (Signed)
[phone], Michelle-in home nurse. Please call regarding med list and refills / Sherri

## 2013-01-01 NOTE — Telephone Encounter (Signed)
Opened encounter in error  

## 2013-01-03 ENCOUNTER — Other Ambulatory Visit: Payer: Self-pay

## 2013-01-03 DIAGNOSIS — R0602 Shortness of breath: Secondary | ICD-10-CM

## 2013-01-03 DIAGNOSIS — R079 Chest pain, unspecified: Secondary | ICD-10-CM

## 2013-01-03 DIAGNOSIS — R609 Edema, unspecified: Secondary | ICD-10-CM

## 2013-01-03 MED ORDER — ALIGN 4 MG PO CAPS: 1.0000 | ORAL_CAPSULE | Freq: Every day | ORAL | Status: DC

## 2013-01-03 MED ORDER — SIMVASTATIN 40 MG PO TABS: 20.0000 mg | ORAL_TABLET | Freq: Every day | ORAL | Status: DC

## 2013-01-03 MED ORDER — LEVETIRACETAM 500 MG PO TABS: 500.0000 mg | ORAL_TABLET | Freq: Two times a day (BID) | ORAL | Status: DC

## 2013-01-03 MED ORDER — BUDESONIDE-FORMOTEROL FUMARATE 160-4.5 MCG/ACT IN AERO: 2.0000 | INHALATION_SPRAY | Freq: Two times a day (BID) | RESPIRATORY_TRACT | Status: DC

## 2013-01-03 MED ORDER — LOSARTAN POTASSIUM-HCTZ 100-25 MG PO TABS: 1.0000 | ORAL_TABLET | Freq: Every day | ORAL | Status: DC

## 2013-01-03 MED ORDER — ZOLPIDEM TARTRATE 5 MG PO TABS: 5.0000 mg | ORAL_TABLET | Freq: Every evening | ORAL | Status: DC | PRN

## 2013-01-11 ENCOUNTER — Telehealth: Payer: Self-pay | Admitting: Endocrinology

## 2013-01-12 DIAGNOSIS — E1165 Type 2 diabetes mellitus with hyperglycemia: Secondary | ICD-10-CM

## 2013-01-12 DIAGNOSIS — I5032 Chronic diastolic (congestive) heart failure: Secondary | ICD-10-CM

## 2013-01-12 DIAGNOSIS — IMO0001 Reserved for inherently not codable concepts without codable children: Secondary | ICD-10-CM

## 2013-01-12 DIAGNOSIS — I509 Heart failure, unspecified: Secondary | ICD-10-CM

## 2013-01-12 NOTE — Telephone Encounter (Signed)
Called Tiffany with [facility] at # [phone] ext [phone] and advised her that Dr Everardo All has not received a form, if she will re-fax them to our office for him. Confirmed fax number and she stated she will re-fax them. Be advised.

## 2013-01-12 NOTE — Telephone Encounter (Signed)
i do not have the form

## 2013-01-15 ENCOUNTER — Other Ambulatory Visit: Payer: Self-pay | Admitting: Endocrinology

## 2013-01-19 ENCOUNTER — Ambulatory Visit (INDEPENDENT_AMBULATORY_CARE_PROVIDER_SITE_OTHER): Payer: Medicare PPO | Admitting: Endocrinology

## 2013-01-19 ENCOUNTER — Encounter: Payer: Self-pay | Admitting: Endocrinology

## 2013-01-19 VITALS — BP 138/84 | HR 88 | Wt 359.0 lb

## 2013-01-19 DIAGNOSIS — M25559 Pain in unspecified hip: Secondary | ICD-10-CM

## 2013-01-19 DIAGNOSIS — E1059 Type 1 diabetes mellitus with other circulatory complications: Secondary | ICD-10-CM

## 2013-01-19 DIAGNOSIS — M25552 Pain in left hip: Secondary | ICD-10-CM

## 2013-01-19 DIAGNOSIS — Z23 Encounter for immunization: Secondary | ICD-10-CM

## 2013-01-19 MED ORDER — OXYCODONE-ACETAMINOPHEN 10-325 MG PO TABS: 1.0000 | ORAL_TABLET | ORAL | Status: DC | PRN

## 2013-01-19 MED ORDER — LUBIPROSTONE 24 MCG PO CAPS: 24.0000 ug | ORAL_CAPSULE | Freq: Every day | ORAL | Status: DC
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
## 2013-01-19 NOTE — Patient Instructions (Addendum)
check your blood sugar twice a day.  vary the time of day when you check, between before the 3 meals, and at bedtime.  also check if you have symptoms of your blood sugar being too high or too low.  please keep a record of the readings and bring it to your next appointment here.  please call us sooner if your blood sugar goes below 70, or if you have a lot of readings over 200.   i have sent a prescription to your pharmacy, to take daily for the bowels.  Please come back for a "medicare wellness" appointment in 6 weeks.  Here is a refill of the pain medication.  Let's check an x-ray.  We'll contact you with results.

## 2013-01-26 ENCOUNTER — Ambulatory Visit
Admission: RE | Admit: 2013-01-26 | Discharge: 2013-01-26 | Disposition: A | Discharge status: Home / Self Care | Payer: Medicare PPO | Source: Ambulatory Visit | Attending: Endocrinology | Admitting: Endocrinology

## 2013-01-26 DIAGNOSIS — Z0279 Encounter for issue of other medical certificate: Secondary | ICD-10-CM

## 2013-01-29 ENCOUNTER — Other Ambulatory Visit: Payer: Self-pay | Admitting: *Deleted

## 2013-01-29 ENCOUNTER — Other Ambulatory Visit: Payer: Self-pay | Admitting: Endocrinology

## 2013-01-29 DIAGNOSIS — R079 Chest pain, unspecified: Secondary | ICD-10-CM

## 2013-01-29 DIAGNOSIS — R609 Edema, unspecified: Secondary | ICD-10-CM

## 2013-01-29 DIAGNOSIS — R0602 Shortness of breath: Secondary | ICD-10-CM

## 2013-01-29 MED ORDER — POTASSIUM CHLORIDE CRYS ER 20 MEQ PO TBCR: 20.0000 meq | EXTENDED_RELEASE_TABLET | Freq: Every day | ORAL | Status: DC

## 2013-01-29 MED ORDER — AMLODIPINE BESYLATE 2.5 MG PO TABS: 2.5000 mg | ORAL_TABLET | Freq: Every day | ORAL | Status: DC

## 2013-01-29 MED ORDER — SIMVASTATIN 40 MG PO TABS: 20.0000 mg | ORAL_TABLET | Freq: Every day | ORAL | Status: DC

## 2013-01-30 ENCOUNTER — Telehealth: Payer: Self-pay

## 2013-01-30 MED ORDER — INSULIN NPH (HUMAN) (ISOPHANE) 100 UNIT/ML ~~LOC~~ SUSP: 400.0000 [IU] | SUBCUTANEOUS | Status: DC

## 2013-01-30 NOTE — Telephone Encounter (Signed)
Pt advised and states she would rather draw insulin form vial

## 2013-01-30 NOTE — Telephone Encounter (Signed)
Pt left vociemail stating her insulin is too exspensive $203, would like a cheaper insulin

## 2013-01-30 NOTE — Telephone Encounter (Signed)
Pt advised.

## 2013-01-30 NOTE — Telephone Encounter (Signed)
The only way to make it cheaper would be to draw it up from the vial.  Do you want to change?

## 2013-01-30 NOTE — Telephone Encounter (Signed)
It is cheapest a [facility].  i have sent a prescription to [facility] at [address]. This is a guess as to the amount.  We will have to adjust Please come back for a follow-up appointment in 2 weeks

## 2013-02-13 ENCOUNTER — Encounter: Payer: Self-pay | Admitting: Endocrinology

## 2013-02-13 ENCOUNTER — Ambulatory Visit (INDEPENDENT_AMBULATORY_CARE_PROVIDER_SITE_OTHER): Payer: Medicare PPO | Admitting: Endocrinology

## 2013-02-13 VITALS — BP 130/80 | Temp 98.4°F | Wt 368.5 lb

## 2013-02-13 DIAGNOSIS — E1059 Type 1 diabetes mellitus with other circulatory complications: Secondary | ICD-10-CM

## 2013-02-13 DIAGNOSIS — E119 Type 2 diabetes mellitus without complications: Secondary | ICD-10-CM

## 2013-02-13 MED ORDER — INSULIN NPH (HUMAN) (ISOPHANE) 100 UNIT/ML ~~LOC~~ SUSP: 400.0000 [IU] | SUBCUTANEOUS | Status: DC

## 2013-02-13 NOTE — Patient Instructions (Addendum)
check your blood sugar twice a day.  vary the time of day when you check, between before the 3 meals, and at bedtime.  also check if you have symptoms of your blood sugar being too high or too low.  please keep a record of the readings and bring it to your next appointment here.  please call us sooner if your blood sugar goes below 70, or if you have a lot of readings over 200.   Please come back for a regular physical appointment in 1 month.   i have sent a prescription to your [facility] again, for the cheaper insulin.
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
## 2013-02-26 ENCOUNTER — Other Ambulatory Visit: Payer: Self-pay | Admitting: *Deleted

## 2013-02-26 ENCOUNTER — Other Ambulatory Visit: Payer: Self-pay | Admitting: Endocrinology

## 2013-02-26 MED ORDER — BUPROPION HCL ER (XL) 300 MG PO TB24: 300.0000 mg | ORAL_TABLET | Freq: Every day | ORAL | Status: DC

## 2013-03-05 ENCOUNTER — Other Ambulatory Visit: Payer: Medicare PPO

## 2013-03-09 ENCOUNTER — Other Ambulatory Visit: Payer: Self-pay | Admitting: Endocrinology

## 2013-03-09 ENCOUNTER — Other Ambulatory Visit (INDEPENDENT_AMBULATORY_CARE_PROVIDER_SITE_OTHER): Payer: Medicare PPO

## 2013-03-09 DIAGNOSIS — I10 Essential (primary) hypertension: Secondary | ICD-10-CM

## 2013-03-09 DIAGNOSIS — Z79899 Other long term (current) drug therapy: Secondary | ICD-10-CM

## 2013-03-09 DIAGNOSIS — K7460 Unspecified cirrhosis of liver: Secondary | ICD-10-CM

## 2013-03-09 DIAGNOSIS — E7800 Pure hypercholesterolemia, unspecified: Secondary | ICD-10-CM

## 2013-03-09 DIAGNOSIS — E119 Type 2 diabetes mellitus without complications: Secondary | ICD-10-CM

## 2013-03-09 DIAGNOSIS — I5032 Chronic diastolic (congestive) heart failure: Secondary | ICD-10-CM

## 2013-03-09 DIAGNOSIS — M810 Age-related osteoporosis without current pathological fracture: Secondary | ICD-10-CM

## 2013-03-09 DIAGNOSIS — E1059 Type 1 diabetes mellitus with other circulatory complications: Secondary | ICD-10-CM

## 2013-03-09 DIAGNOSIS — F329 Major depressive disorder, single episode, unspecified: Secondary | ICD-10-CM

## 2013-03-09 DIAGNOSIS — E871 Hypo-osmolality and hyponatremia: Secondary | ICD-10-CM

## 2013-03-09 DIAGNOSIS — F3289 Other specified depressive episodes: Secondary | ICD-10-CM

## 2013-03-09 DIAGNOSIS — I509 Heart failure, unspecified: Secondary | ICD-10-CM

## 2013-03-09 LAB — BASIC METABOLIC PANEL
Calcium: 9.3 mg/dL (ref 8.4–10.5)
Creatinine, Ser: 1.9 mg/dL — ABNORMAL HIGH (ref 0.4–1.2)
GFR: 33.08 mL/min — ABNORMAL LOW (ref >60.00)

## 2013-03-09 LAB — URINALYSIS, ROUTINE W REFLEX MICROSCOPIC
Leukocytes, UA: NEGATIVE
Specific Gravity, Urine: >=1.03 (ref 1.000–1.030)
Urine Glucose: 100
Urobilinogen, UA: 2 (ref 0.0–1.0)

## 2013-03-09 LAB — CBC WITH DIFFERENTIAL/PLATELET
Basophils Relative: 0.3 % (ref 0.0–3.0)
Eosinophils Relative: 0.7 % (ref 0.0–5.0)
HCT: 42.7 % (ref 36.0–46.0)
Hemoglobin: 14.4 g/dL (ref 12.0–15.0)
Lymphs Abs: 1.9 10*3/uL (ref 0.7–4.0)
Monocytes Relative: 10.3 % (ref 3.0–12.0)
Platelets: 223 10*3/uL (ref 150.0–400.0)
RBC: 4.46 Mil/uL (ref 3.87–5.11)
WBC: 9.7 10*3/uL (ref 4.5–10.5)

## 2013-03-09 LAB — MICROALBUMIN / CREATININE URINE RATIO
Creatinine,U: 418.9 mg/dL
Microalb Creat Ratio: 0.8 mg/g (ref 0.0–30.0)

## 2013-03-09 LAB — LIPID PANEL
LDL Cholesterol: 64 mg/dL (ref 0–99)
Total CHOL/HDL Ratio: 4

## 2013-03-09 LAB — HEPATIC FUNCTION PANEL
AST: 21 U/L (ref 0–37)
Albumin: 3.5 g/dL (ref 3.5–5.2)

## 2013-03-09 LAB — TSH: TSH: 1.6 u[IU]/mL (ref 0.35–5.50)

## 2013-03-09 LAB — HEMOGLOBIN A1C: Hgb A1c MFr Bld: 10.4 % — ABNORMAL HIGH (ref 4.6–6.5)

## 2013-03-12 LAB — PTH, INTACT AND CALCIUM: PTH: 85.3 pg/mL — ABNORMAL HIGH (ref 14.0–72.0)

## 2013-03-14 ENCOUNTER — Other Ambulatory Visit: Payer: Self-pay | Admitting: *Deleted

## 2013-03-14 ENCOUNTER — Telehealth: Payer: Self-pay | Admitting: *Deleted

## 2013-03-14 MED ORDER — OXYCODONE-ACETAMINOPHEN 10-325 MG PO TABS: 1.0000 | ORAL_TABLET | ORAL | Status: DC | PRN

## 2013-03-14 NOTE — Telephone Encounter (Signed)
Pt is requesting a pain medication CB# [phone]

## 2013-03-14 NOTE — Telephone Encounter (Signed)
Ok to refill x 1  

## 2013-03-19 ENCOUNTER — Ambulatory Visit (INDEPENDENT_AMBULATORY_CARE_PROVIDER_SITE_OTHER): Payer: Medicare PPO | Admitting: Endocrinology

## 2013-03-19 VITALS — BP 130/90 | HR 89 | Temp 98.3°F | Ht 67.0 in | Wt 365.2 lb

## 2013-03-19 DIAGNOSIS — R569 Unspecified convulsions: Secondary | ICD-10-CM

## 2013-03-19 DIAGNOSIS — Z0000 Encounter for general adult medical examination without abnormal findings: Secondary | ICD-10-CM

## 2013-03-19 DIAGNOSIS — N289 Disorder of kidney and ureter, unspecified: Secondary | ICD-10-CM

## 2013-03-19 LAB — BASIC METABOLIC PANEL
BUN: 13 mg/dL (ref 6–23)
Chloride: 98 mEq/L (ref 96–112)
Creatinine, Ser: 1 mg/dL (ref 0.4–1.2)
GFR: 67.11 mL/min (ref >60.00)
Glucose, Bld: 418 mg/dL — ABNORMAL HIGH (ref 70–99)

## 2013-03-19 MED ORDER — INSULIN NPH (HUMAN) (ISOPHANE) 100 UNIT/ML ~~LOC~~ SUSP: 450.0000 [IU] | SUBCUTANEOUS | Status: DC

## 2013-03-19 NOTE — Patient Instructions (Addendum)
check your blood sugar twice a day.  vary the time of day when you check, between before the 3 meals, and at bedtime.  also check if you have symptoms of your blood sugar being too high or too low.  please keep a record of the readings and bring it to your next appointment here.  please call us sooner if your blood sugar goes below 70, or if you have a lot of readings over 200.   Please come back for a follow-up appointment in January.   Please increase the insulin to 450 units each morning.   blood tests are being requested for you today.  We'll contact you with results.   Refer to a neurology specialist, to see if you need to keep taking the "keppra."  you will receive a phone call, about a day and time for an appointment. please consider these measures for your health:  minimize alcohol.  do not use tobacco products.  have a colonoscopy at least every 10 years from age 8.  Women should have an annual mammogram from age 66.  keep firearms safely stored.  always use seat belts.  have working smoke alarms in your home.  see an eye doctor and dentist regularly.  never drive under the influence of alcohol or drugs (including prescription drugs).   it is critically important to prevent falling down (keep floor areas well-lit, dry, and free of loose objects.  If you have a cane, walker, or wheelchair, you should use it, even for short trips around the house.  Also, try not to rush) [facility] [phone], to make an appointment for a mammogram.
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
## 2013-03-20 ENCOUNTER — Other Ambulatory Visit: Payer: Self-pay | Admitting: Endocrinology

## 2013-03-20 DIAGNOSIS — Z1231 Encounter for screening mammogram for malignant neoplasm of breast: Secondary | ICD-10-CM

## 2013-04-06 ENCOUNTER — Ambulatory Visit (HOSPITAL_COMMUNITY)
Admission: RE | Admit: 2013-04-06 | Discharge: 2013-04-06 | Disposition: A | Discharge status: Home / Self Care | Payer: Medicare PPO | Source: Ambulatory Visit | Attending: Endocrinology | Admitting: Endocrinology

## 2013-04-06 DIAGNOSIS — Z1231 Encounter for screening mammogram for malignant neoplasm of breast: Secondary | ICD-10-CM

## 2013-04-18 ENCOUNTER — Telehealth: Payer: Self-pay | Admitting: *Deleted

## 2013-04-18 ENCOUNTER — Other Ambulatory Visit: Payer: Self-pay | Admitting: *Deleted

## 2013-04-18 MED ORDER — OXYCODONE-ACETAMINOPHEN 10-325 MG PO TABS: 1.0000 | ORAL_TABLET | ORAL | Status: DC | PRN

## 2013-04-18 NOTE — Telephone Encounter (Signed)
May refill 10 mg oxycodone, every 6 hours as needed, 120 tablets

## 2013-04-18 NOTE — Telephone Encounter (Signed)
Pt is aware.  

## 2013-04-18 NOTE — Telephone Encounter (Signed)
Patient of Dr. Henderson Cloud is requesting a refill of Oxycodone, last filled on 11/26 for 120 tablets, takes 1 every 4 hours. Please advise

## 2013-04-23 ENCOUNTER — Ambulatory Visit: Payer: Medicare PPO | Admitting: Endocrinology

## 2013-04-25 DIAGNOSIS — F3289 Other specified depressive episodes: Secondary | ICD-10-CM

## 2013-04-25 DIAGNOSIS — J449 Chronic obstructive pulmonary disease, unspecified: Secondary | ICD-10-CM

## 2013-04-25 DIAGNOSIS — F329 Major depressive disorder, single episode, unspecified: Secondary | ICD-10-CM

## 2013-04-25 DIAGNOSIS — E119 Type 2 diabetes mellitus without complications: Secondary | ICD-10-CM

## 2013-04-25 DIAGNOSIS — I509 Heart failure, unspecified: Secondary | ICD-10-CM

## 2013-04-25 DIAGNOSIS — I10 Essential (primary) hypertension: Secondary | ICD-10-CM

## 2013-04-27 ENCOUNTER — Encounter: Payer: Self-pay | Admitting: Endocrinology

## 2013-04-27 ENCOUNTER — Ambulatory Visit (INDEPENDENT_AMBULATORY_CARE_PROVIDER_SITE_OTHER): Payer: Medicare PPO | Admitting: Endocrinology

## 2013-04-27 VITALS — BP 116/80 | HR 85 | Temp 98.2°F | Ht 67.0 in | Wt 370.0 lb

## 2013-04-27 DIAGNOSIS — E1059 Type 1 diabetes mellitus with other circulatory complications: Secondary | ICD-10-CM

## 2013-04-27 MED ORDER — BUDESONIDE-FORMOTEROL FUMARATE 160-4.5 MCG/ACT IN AERO: 2.0000 | INHALATION_SPRAY | Freq: Two times a day (BID) | RESPIRATORY_TRACT | Status: DC

## 2013-04-27 MED ORDER — LUBIPROSTONE 24 MCG PO CAPS: 24.0000 ug | ORAL_CAPSULE | Freq: Every day | ORAL | Status: DC

## 2013-04-27 MED ORDER — ALBUTEROL SULFATE HFA 108 (90 BASE) MCG/ACT IN AERS: 2.0000 | INHALATION_SPRAY | Freq: Two times a day (BID) | RESPIRATORY_TRACT | Status: DC | PRN
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
## 2013-04-27 NOTE — Patient Instructions (Addendum)
check your blood sugar twice a day.  vary the time of day when you check, between before the 3 meals, and at bedtime.  also check if you have symptoms of your blood sugar being too high or too low.  please keep a record of the readings and bring it to your next appointment here.  please call us sooner if your blood sugar goes below 70, or if you have a lot of readings over 200.   Please come back for a follow-up appointment in 6 weeks.   Please continue the same insulin for now.

## 2013-05-14 ENCOUNTER — Ambulatory Visit: Payer: Medicare PPO | Admitting: Neurology

## 2013-05-17 ENCOUNTER — Emergency Department (HOSPITAL_COMMUNITY): Payer: Medicare PPO

## 2013-05-17 ENCOUNTER — Encounter (HOSPITAL_COMMUNITY): Payer: Self-pay | Admitting: Emergency Medicine

## 2013-05-17 ENCOUNTER — Emergency Department (HOSPITAL_COMMUNITY)
Admission: EM | Admit: 2013-05-17 | Discharge: 2013-05-17 | Disposition: A | Discharge status: Home / Self Care | Payer: Medicare PPO | Attending: Emergency Medicine | Admitting: Emergency Medicine

## 2013-05-17 DIAGNOSIS — R52 Pain, unspecified: Secondary | ICD-10-CM | POA: Insufficient documentation

## 2013-05-17 DIAGNOSIS — K2970 Gastritis, unspecified, without bleeding: Secondary | ICD-10-CM | POA: Insufficient documentation

## 2013-05-17 DIAGNOSIS — Z888 Allergy status to other drugs, medicaments and biological substances status: Secondary | ICD-10-CM | POA: Insufficient documentation

## 2013-05-17 DIAGNOSIS — R059 Cough, unspecified: Secondary | ICD-10-CM | POA: Insufficient documentation

## 2013-05-17 DIAGNOSIS — F329 Major depressive disorder, single episode, unspecified: Secondary | ICD-10-CM | POA: Insufficient documentation

## 2013-05-17 DIAGNOSIS — R197 Diarrhea, unspecified: Secondary | ICD-10-CM | POA: Insufficient documentation

## 2013-05-17 DIAGNOSIS — IMO0002 Reserved for concepts with insufficient information to code with codable children: Secondary | ICD-10-CM | POA: Insufficient documentation

## 2013-05-17 DIAGNOSIS — G4733 Obstructive sleep apnea (adult) (pediatric): Secondary | ICD-10-CM | POA: Insufficient documentation

## 2013-05-17 DIAGNOSIS — K219 Gastro-esophageal reflux disease without esophagitis: Secondary | ICD-10-CM | POA: Insufficient documentation

## 2013-05-17 DIAGNOSIS — K3184 Gastroparesis: Secondary | ICD-10-CM | POA: Insufficient documentation

## 2013-05-17 DIAGNOSIS — I69959 Hemiplegia and hemiparesis following unspecified cerebrovascular disease affecting unspecified side: Secondary | ICD-10-CM | POA: Insufficient documentation

## 2013-05-17 DIAGNOSIS — J3489 Other specified disorders of nose and nasal sinuses: Secondary | ICD-10-CM | POA: Insufficient documentation

## 2013-05-17 DIAGNOSIS — K5730 Diverticulosis of large intestine without perforation or abscess without bleeding: Secondary | ICD-10-CM | POA: Insufficient documentation

## 2013-05-17 DIAGNOSIS — J4489 Other specified chronic obstructive pulmonary disease: Secondary | ICD-10-CM | POA: Insufficient documentation

## 2013-05-17 DIAGNOSIS — I10 Essential (primary) hypertension: Secondary | ICD-10-CM | POA: Insufficient documentation

## 2013-05-17 DIAGNOSIS — Z79899 Other long term (current) drug therapy: Secondary | ICD-10-CM | POA: Insufficient documentation

## 2013-05-17 DIAGNOSIS — F17200 Nicotine dependence, unspecified, uncomplicated: Secondary | ICD-10-CM | POA: Insufficient documentation

## 2013-05-17 DIAGNOSIS — R112 Nausea with vomiting, unspecified: Secondary | ICD-10-CM | POA: Insufficient documentation

## 2013-05-17 DIAGNOSIS — I509 Heart failure, unspecified: Secondary | ICD-10-CM | POA: Insufficient documentation

## 2013-05-17 DIAGNOSIS — F3289 Other specified depressive episodes: Secondary | ICD-10-CM | POA: Insufficient documentation

## 2013-05-17 DIAGNOSIS — D126 Benign neoplasm of colon, unspecified: Secondary | ICD-10-CM | POA: Insufficient documentation

## 2013-05-17 DIAGNOSIS — Z794 Long term (current) use of insulin: Secondary | ICD-10-CM | POA: Insufficient documentation

## 2013-05-17 DIAGNOSIS — K7460 Unspecified cirrhosis of liver: Secondary | ICD-10-CM | POA: Insufficient documentation

## 2013-05-17 DIAGNOSIS — E669 Obesity, unspecified: Secondary | ICD-10-CM | POA: Insufficient documentation

## 2013-05-17 DIAGNOSIS — J309 Allergic rhinitis, unspecified: Secondary | ICD-10-CM | POA: Insufficient documentation

## 2013-05-17 DIAGNOSIS — R569 Unspecified convulsions: Secondary | ICD-10-CM | POA: Insufficient documentation

## 2013-05-17 DIAGNOSIS — Z8679 Personal history of other diseases of the circulatory system: Secondary | ICD-10-CM | POA: Insufficient documentation

## 2013-05-17 DIAGNOSIS — R05 Cough: Secondary | ICD-10-CM | POA: Insufficient documentation

## 2013-05-17 DIAGNOSIS — R51 Headache: Secondary | ICD-10-CM | POA: Insufficient documentation

## 2013-05-17 DIAGNOSIS — Z7982 Long term (current) use of aspirin: Secondary | ICD-10-CM | POA: Insufficient documentation

## 2013-05-17 DIAGNOSIS — K2990 Gastroduodenitis, unspecified, without bleeding: Secondary | ICD-10-CM

## 2013-05-17 DIAGNOSIS — E7800 Pure hypercholesterolemia, unspecified: Secondary | ICD-10-CM | POA: Insufficient documentation

## 2013-05-17 DIAGNOSIS — J449 Chronic obstructive pulmonary disease, unspecified: Secondary | ICD-10-CM | POA: Insufficient documentation

## 2013-05-17 DIAGNOSIS — M810 Age-related osteoporosis without current pathological fracture: Secondary | ICD-10-CM | POA: Insufficient documentation

## 2013-05-17 DIAGNOSIS — M549 Dorsalgia, unspecified: Secondary | ICD-10-CM | POA: Insufficient documentation

## 2013-05-17 DIAGNOSIS — E119 Type 2 diabetes mellitus without complications: Secondary | ICD-10-CM | POA: Insufficient documentation

## 2013-05-17 DIAGNOSIS — J111 Influenza due to unidentified influenza virus with other respiratory manifestations: Secondary | ICD-10-CM | POA: Insufficient documentation

## 2013-05-17 DIAGNOSIS — Z780 Asymptomatic menopausal state: Secondary | ICD-10-CM | POA: Insufficient documentation

## 2013-05-17 DIAGNOSIS — R0602 Shortness of breath: Secondary | ICD-10-CM | POA: Insufficient documentation

## 2013-05-17 DIAGNOSIS — IMO0001 Reserved for inherently not codable concepts without codable children: Secondary | ICD-10-CM | POA: Insufficient documentation

## 2013-05-17 DIAGNOSIS — M1990 Unspecified osteoarthritis, unspecified site: Secondary | ICD-10-CM | POA: Insufficient documentation

## 2013-05-17 HISTORY — DX: Obesity, unspecified: E66.9

## 2013-05-17 LAB — COMPREHENSIVE METABOLIC PANEL
ALT: 25 U/L (ref 0–35)
AST: 38 U/L — ABNORMAL HIGH (ref 0–37)
Albumin: 3 g/dL — ABNORMAL LOW (ref 3.5–5.2)
Alkaline Phosphatase: 85 U/L (ref 39–117)
BUN: 10 mg/dL (ref 6–23)
CO2: 26 mEq/L (ref 19–32)
Calcium: 8.4 mg/dL (ref 8.4–10.5)
Chloride: 99 mEq/L (ref 96–112)
Creatinine, Ser: 1.05 mg/dL (ref 0.50–1.10)
GFR calc Af Amer: 60 mL/min — ABNORMAL LOW (ref >90)
GFR calc non Af Amer: 52 mL/min — ABNORMAL LOW (ref >90)
Glucose, Bld: 139 mg/dL — ABNORMAL HIGH (ref 70–99)
Potassium: 3.7 mEq/L (ref 3.7–5.3)
Sodium: 138 mEq/L (ref 137–147)
Total Bilirubin: 0.4 mg/dL (ref 0.3–1.2)
Total Protein: 6.9 g/dL (ref 6.0–8.3)

## 2013-05-17 LAB — CBC WITH DIFFERENTIAL/PLATELET
Basophils Absolute: 0 10*3/uL (ref 0.0–0.1)
Basophils Relative: 1 % (ref 0–1)
Eosinophils Absolute: 0 10*3/uL (ref 0.0–0.7)
Eosinophils Relative: 0 % (ref 0–5)
HCT: 40.9 % (ref 36.0–46.0)
Hemoglobin: 13.9 g/dL (ref 12.0–15.0)
Lymphocytes Relative: 32 % (ref 12–46)
Lymphs Abs: 1.4 10*3/uL (ref 0.7–4.0)
MCH: 33.8 pg (ref 26.0–34.0)
MCHC: 34 g/dL (ref 30.0–36.0)
MCV: 99.5 fL (ref 78.0–100.0)
Monocytes Absolute: 0.7 10*3/uL (ref 0.1–1.0)
Monocytes Relative: 16 % — ABNORMAL HIGH (ref 3–12)
Neutro Abs: 2.3 10*3/uL (ref 1.7–7.7)
Neutrophils Relative %: 52 % (ref 43–77)
Platelets: 229 10*3/uL (ref 150–400)
RBC: 4.11 MIL/uL (ref 3.87–5.11)
RDW: 12.9 % (ref 11.5–15.5)
WBC: 4.4 10*3/uL (ref 4.0–10.5)

## 2013-05-17 MED ORDER — OSELTAMIVIR PHOSPHATE 75 MG PO CAPS
75.0000 mg | ORAL_CAPSULE | Freq: Once | ORAL | Status: AC
  Administered 2013-05-17: 75 mg via ORAL
  Filled 2013-05-17: qty 1

## 2013-05-17 MED ORDER — OSELTAMIVIR PHOSPHATE 75 MG PO CAPS: 75.0000 mg | ORAL_CAPSULE | Freq: Two times a day (BID) | ORAL | Status: DC

## 2013-05-17 MED ORDER — HYDROCODONE-ACETAMINOPHEN 7.5-325 MG/15ML PO SOLN: 10.0000 mL | Freq: Four times a day (QID) | ORAL | Status: DC | PRN

## 2013-05-17 MED ORDER — HYDROCODONE-ACETAMINOPHEN 7.5-325 MG/15ML PO SOLN
15.0000 mL | Freq: Once | ORAL | Status: AC
  Administered 2013-05-17: 15 mL via ORAL
  Filled 2013-05-17: qty 15

## 2013-05-17 NOTE — ED Notes (Signed)
Asked pt to provide a urine specimen pt stated she could not provide one at this time.

## 2013-05-17 NOTE — Discharge Instructions (Signed)
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
## 2013-05-17 NOTE — ED Notes (Signed)
Pt arrived by [facility] for flu like symptoms that started last night. Having fever/chills, cough, bodyaches, n/v/d. Mask on pt at triage.

## 2013-05-17 NOTE — ED Notes (Signed)
Pt in chest ray.

## 2013-05-17 NOTE — ED Notes (Signed)
Patient denies pain and is resting comfortably.  

## 2013-05-18 NOTE — ED Provider Notes (Signed)
Medical screening examination/treatment/procedure(s) were performed by non-physician practitioner and as supervising physician I was immediately available for consultation/collaboration.  EKG Interpretation   None        Lizvet Chunn, MD 05/18/13 0039 

## 2013-05-25 ENCOUNTER — Telehealth: Payer: Self-pay

## 2013-05-25 MED ORDER — OXYCODONE-ACETAMINOPHEN 10-325 MG PO TABS
1.0000 | ORAL_TABLET | ORAL | Status: DC | PRN
Start: 2013-05-25 — End: 2013-06-27

## 2013-05-25 NOTE — Telephone Encounter (Signed)
Pt informed that rx is ready for pick up. Script placed up front.  

## 2013-05-25 NOTE — Telephone Encounter (Signed)
Pt called requesting a refill for Oxycodone. Pt was last seen on 04/27/2013 and medication was last filled on 04/18/2014.  Please advise, Thanks!

## 2013-05-25 NOTE — Telephone Encounter (Signed)
i printed 

## 2013-06-02 ENCOUNTER — Other Ambulatory Visit: Payer: Self-pay | Admitting: Cardiology

## 2013-06-08 ENCOUNTER — Ambulatory Visit: Payer: Medicare PPO | Admitting: Endocrinology

## 2013-06-27 ENCOUNTER — Encounter: Payer: Self-pay | Admitting: Endocrinology

## 2013-06-27 ENCOUNTER — Ambulatory Visit (INDEPENDENT_AMBULATORY_CARE_PROVIDER_SITE_OTHER): Payer: Medicare PPO | Admitting: Endocrinology

## 2013-06-27 VITALS — BP 130/80 | HR 74 | Temp 98.3°F

## 2013-06-27 DIAGNOSIS — R0602 Shortness of breath: Secondary | ICD-10-CM

## 2013-06-27 DIAGNOSIS — E1059 Type 1 diabetes mellitus with other circulatory complications: Secondary | ICD-10-CM

## 2013-06-27 DIAGNOSIS — N289 Disorder of kidney and ureter, unspecified: Secondary | ICD-10-CM

## 2013-06-27 DIAGNOSIS — R079 Chest pain, unspecified: Secondary | ICD-10-CM

## 2013-06-27 DIAGNOSIS — G894 Chronic pain syndrome: Secondary | ICD-10-CM | POA: Insufficient documentation

## 2013-06-27 LAB — BASIC METABOLIC PANEL
BUN: 12 mg/dL (ref 6–23)
CO2: 33 meq/L — AB (ref 19–32)
Calcium: 8.6 mg/dL (ref 8.4–10.5)
Chloride: 102 mEq/L (ref 96–112)
Creatinine, Ser: 1 mg/dL (ref 0.4–1.2)
GFR: 72.68 mL/min (ref >60.00)
Glucose, Bld: 45 mg/dL — CL (ref 70–99)
Potassium: 3 mEq/L — ABNORMAL LOW (ref 3.5–5.1)
SODIUM: 140 meq/L (ref 135–145)

## 2013-06-27 LAB — HEMOGLOBIN A1C: HEMOGLOBIN A1C: 8.7 % — AB (ref 4.6–6.5)

## 2013-06-27 MED ORDER — OXYCODONE-ACETAMINOPHEN 10-325 MG PO TABS: 1.0000 | ORAL_TABLET | ORAL | Status: DC | PRN
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
## 2013-06-27 NOTE — Patient Instructions (Addendum)
check your blood sugar twice a day.  vary the time of day when you check, between before the 3 meals, and at bedtime.  also check if you have symptoms of your blood sugar being too high or too low.  please keep a record of the readings and bring it to your next appointment here.  please call us sooner if your blood sugar goes below 70, or if you have a lot of readings over 200.   Please come back for a follow-up appointment in 3 months.   blood tests are being requested for you today.  We'll contact you with results. Here is a refill of your pain medication.

## 2013-06-29 ENCOUNTER — Other Ambulatory Visit: Payer: Self-pay | Admitting: Endocrinology

## 2013-06-29 ENCOUNTER — Telehealth: Payer: Self-pay

## 2013-06-29 MED ORDER — POTASSIUM CHLORIDE ER 10 MEQ PO TBCR: 10.0000 meq | EXTENDED_RELEASE_TABLET | Freq: Every day | ORAL | Status: DC

## 2013-06-29 NOTE — Telephone Encounter (Signed)
Please take 1 pill per day, x 10 days. If you recover from your illness (i hope you do soon), you won't continue to need it.  I hope you feel better soon.  If you don't feel better by next week, please call back.  Please call sooner if you get worse.

## 2013-06-29 NOTE — Telephone Encounter (Signed)
Pt called back stating she would like a prescription for potassium since her labs were low.  Please advise, Thanks!

## 2013-07-02 MED ORDER — AMOXICILLIN 500 MG PO CAPS: 500.0000 mg | ORAL_CAPSULE | Freq: Three times a day (TID) | ORAL | Status: DC

## 2013-07-02 NOTE — Telephone Encounter (Signed)
Ok, i have sent a prescription to your pharmacy, for an antibiotic pill, on a trial basis.

## 2013-07-02 NOTE — Telephone Encounter (Signed)
Left voicemail informing of antibiotic pill.

## 2013-07-02 NOTE — Telephone Encounter (Signed)
Pt informed. She asked if something could be given for a sore throat? Please advise, Thanks!

## 2013-07-24 ENCOUNTER — Other Ambulatory Visit: Payer: Self-pay | Admitting: Endocrinology

## 2013-08-02 ENCOUNTER — Telehealth: Payer: Self-pay | Admitting: Endocrinology

## 2013-08-02 NOTE — Telephone Encounter (Signed)
Pt informed that OV would be needed for form to be filled out.  Pt to call back and set appointment.

## 2013-08-02 NOTE — Telephone Encounter (Signed)
Checking on the knee brace rx status

## 2013-08-07 ENCOUNTER — Ambulatory Visit (INDEPENDENT_AMBULATORY_CARE_PROVIDER_SITE_OTHER): Payer: Medicare PPO | Admitting: Endocrinology

## 2013-08-07 ENCOUNTER — Encounter: Payer: Self-pay | Admitting: Endocrinology

## 2013-08-07 VITALS — BP 118/78 | HR 81 | Temp 98.0°F

## 2013-08-07 DIAGNOSIS — M545 Low back pain, unspecified: Secondary | ICD-10-CM

## 2013-08-07 MED ORDER — OXYCODONE-ACETAMINOPHEN 10-325 MG PO TABS: 1.0000 | ORAL_TABLET | ORAL | Status: DC | PRN
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
## 2013-08-07 NOTE — Patient Instructions (Signed)
i have done the forms for the knee brace and heating pad. Sorry, you don't qualify for diabetic shoes.

## 2013-08-09 ENCOUNTER — Telehealth: Payer: Self-pay | Admitting: Endocrinology

## 2013-08-09 NOTE — Telephone Encounter (Signed)
followup on rx for knee brace did we receive it and what is the outcome is it approved

## 2013-08-09 NOTE — Telephone Encounter (Signed)
Pt informed that forms were sent day of ov.

## 2013-08-29 ENCOUNTER — Other Ambulatory Visit: Payer: Self-pay | Admitting: Endocrinology

## 2013-08-30 ENCOUNTER — Other Ambulatory Visit: Payer: Self-pay | Admitting: *Deleted

## 2013-08-30 MED ORDER — POTASSIUM CHLORIDE CRYS ER 20 MEQ PO TBCR: 20.0000 meq | EXTENDED_RELEASE_TABLET | Freq: Every day | ORAL | Status: DC

## 2013-09-09 ENCOUNTER — Emergency Department (HOSPITAL_COMMUNITY): Payer: Medicare PPO

## 2013-09-09 ENCOUNTER — Encounter (HOSPITAL_COMMUNITY): Payer: Self-pay | Admitting: Emergency Medicine

## 2013-09-09 ENCOUNTER — Emergency Department (HOSPITAL_COMMUNITY)
Admission: EM | Admit: 2013-09-09 | Discharge: 2013-09-09 | Disposition: A | Discharge status: Home / Self Care | Payer: Medicare PPO | Attending: Emergency Medicine | Admitting: Emergency Medicine

## 2013-09-09 DIAGNOSIS — Z8673 Personal history of transient ischemic attack (TIA), and cerebral infarction without residual deficits: Secondary | ICD-10-CM | POA: Insufficient documentation

## 2013-09-09 DIAGNOSIS — M129 Arthropathy, unspecified: Secondary | ICD-10-CM | POA: Insufficient documentation

## 2013-09-09 DIAGNOSIS — J4489 Other specified chronic obstructive pulmonary disease: Secondary | ICD-10-CM | POA: Insufficient documentation

## 2013-09-09 DIAGNOSIS — R112 Nausea with vomiting, unspecified: Secondary | ICD-10-CM | POA: Insufficient documentation

## 2013-09-09 DIAGNOSIS — IMO0002 Reserved for concepts with insufficient information to code with codable children: Secondary | ICD-10-CM | POA: Insufficient documentation

## 2013-09-09 DIAGNOSIS — M5410 Radiculopathy, site unspecified: Secondary | ICD-10-CM

## 2013-09-09 DIAGNOSIS — M48061 Spinal stenosis, lumbar region without neurogenic claudication: Secondary | ICD-10-CM | POA: Insufficient documentation

## 2013-09-09 DIAGNOSIS — I509 Heart failure, unspecified: Secondary | ICD-10-CM | POA: Insufficient documentation

## 2013-09-09 DIAGNOSIS — Z8742 Personal history of other diseases of the female genital tract: Secondary | ICD-10-CM | POA: Insufficient documentation

## 2013-09-09 DIAGNOSIS — F17200 Nicotine dependence, unspecified, uncomplicated: Secondary | ICD-10-CM | POA: Insufficient documentation

## 2013-09-09 DIAGNOSIS — G40909 Epilepsy, unspecified, not intractable, without status epilepticus: Secondary | ICD-10-CM | POA: Insufficient documentation

## 2013-09-09 DIAGNOSIS — G8929 Other chronic pain: Secondary | ICD-10-CM | POA: Insufficient documentation

## 2013-09-09 DIAGNOSIS — F3289 Other specified depressive episodes: Secondary | ICD-10-CM | POA: Insufficient documentation

## 2013-09-09 DIAGNOSIS — K219 Gastro-esophageal reflux disease without esophagitis: Secondary | ICD-10-CM | POA: Insufficient documentation

## 2013-09-09 DIAGNOSIS — M549 Dorsalgia, unspecified: Secondary | ICD-10-CM

## 2013-09-09 DIAGNOSIS — E7800 Pure hypercholesterolemia, unspecified: Secondary | ICD-10-CM | POA: Insufficient documentation

## 2013-09-09 DIAGNOSIS — Z794 Long term (current) use of insulin: Secondary | ICD-10-CM | POA: Insufficient documentation

## 2013-09-09 DIAGNOSIS — S79919A Unspecified injury of unspecified hip, initial encounter: Secondary | ICD-10-CM | POA: Insufficient documentation

## 2013-09-09 DIAGNOSIS — R197 Diarrhea, unspecified: Secondary | ICD-10-CM | POA: Insufficient documentation

## 2013-09-09 DIAGNOSIS — Z860100 Personal history of colon polyps, unspecified: Secondary | ICD-10-CM | POA: Insufficient documentation

## 2013-09-09 DIAGNOSIS — Y9301 Activity, walking, marching and hiking: Secondary | ICD-10-CM | POA: Insufficient documentation

## 2013-09-09 DIAGNOSIS — Z792 Long term (current) use of antibiotics: Secondary | ICD-10-CM | POA: Insufficient documentation

## 2013-09-09 DIAGNOSIS — Z79899 Other long term (current) drug therapy: Secondary | ICD-10-CM | POA: Insufficient documentation

## 2013-09-09 DIAGNOSIS — Y929 Unspecified place or not applicable: Secondary | ICD-10-CM | POA: Insufficient documentation

## 2013-09-09 DIAGNOSIS — F329 Major depressive disorder, single episode, unspecified: Secondary | ICD-10-CM | POA: Insufficient documentation

## 2013-09-09 DIAGNOSIS — E119 Type 2 diabetes mellitus without complications: Secondary | ICD-10-CM | POA: Insufficient documentation

## 2013-09-09 DIAGNOSIS — S79929A Unspecified injury of unspecified thigh, initial encounter: Secondary | ICD-10-CM

## 2013-09-09 DIAGNOSIS — R300 Dysuria: Secondary | ICD-10-CM | POA: Insufficient documentation

## 2013-09-09 DIAGNOSIS — Z7982 Long term (current) use of aspirin: Secondary | ICD-10-CM | POA: Insufficient documentation

## 2013-09-09 DIAGNOSIS — J449 Chronic obstructive pulmonary disease, unspecified: Secondary | ICD-10-CM | POA: Insufficient documentation

## 2013-09-09 DIAGNOSIS — R296 Repeated falls: Secondary | ICD-10-CM | POA: Insufficient documentation

## 2013-09-09 DIAGNOSIS — J029 Acute pharyngitis, unspecified: Secondary | ICD-10-CM | POA: Insufficient documentation

## 2013-09-09 DIAGNOSIS — M4800 Spinal stenosis, site unspecified: Secondary | ICD-10-CM

## 2013-09-09 DIAGNOSIS — I10 Essential (primary) hypertension: Secondary | ICD-10-CM | POA: Insufficient documentation

## 2013-09-09 DIAGNOSIS — I252 Old myocardial infarction: Secondary | ICD-10-CM | POA: Insufficient documentation

## 2013-09-09 LAB — COMPREHENSIVE METABOLIC PANEL
ALT: 13 U/L (ref 0–35)
AST: 19 U/L (ref 0–37)
Albumin: 3.2 g/dL — ABNORMAL LOW (ref 3.5–5.2)
Alkaline Phosphatase: 103 U/L (ref 39–117)
BILIRUBIN TOTAL: 0.3 mg/dL (ref 0.3–1.2)
BUN: 9 mg/dL (ref 6–23)
CO2: 32 mEq/L (ref 19–32)
Calcium: 9.4 mg/dL (ref 8.4–10.5)
Chloride: 99 mEq/L (ref 96–112)
Creatinine, Ser: 1.02 mg/dL (ref 0.50–1.10)
GFR calc Af Amer: 63 mL/min — ABNORMAL LOW (ref >90)
GFR, EST NON AFRICAN AMERICAN: 54 mL/min — AB (ref >90)
GLUCOSE: 84 mg/dL (ref 70–99)
Potassium: 3.8 mEq/L (ref 3.7–5.3)
Sodium: 139 mEq/L (ref 137–147)
Total Protein: 8.1 g/dL (ref 6.0–8.3)

## 2013-09-09 LAB — CBC WITH DIFFERENTIAL/PLATELET
Basophils Absolute: 0 10*3/uL (ref 0.0–0.1)
Basophils Relative: 0 % (ref 0–1)
EOS ABS: 0.1 10*3/uL (ref 0.0–0.7)
Eosinophils Relative: 2 % (ref 0–5)
HCT: 40.8 % (ref 36.0–46.0)
HEMOGLOBIN: 13 g/dL (ref 12.0–15.0)
LYMPHS ABS: 1.6 10*3/uL (ref 0.7–4.0)
Lymphocytes Relative: 23 % (ref 12–46)
MCH: 32.3 pg (ref 26.0–34.0)
MCHC: 31.9 g/dL (ref 30.0–36.0)
MCV: 101.5 fL — ABNORMAL HIGH (ref 78.0–100.0)
MONOS PCT: 7 % (ref 3–12)
Monocytes Absolute: 0.5 10*3/uL (ref 0.1–1.0)
NEUTROS ABS: 4.6 10*3/uL (ref 1.7–7.7)
Neutrophils Relative %: 68 % (ref 43–77)
Platelets: 234 10*3/uL (ref 150–400)
RBC: 4.02 MIL/uL (ref 3.87–5.11)
RDW: 14.3 % (ref 11.5–15.5)
WBC: 6.8 10*3/uL (ref 4.0–10.5)

## 2013-09-09 LAB — URINALYSIS, ROUTINE W REFLEX MICROSCOPIC
BILIRUBIN URINE: NEGATIVE
Glucose, UA: NEGATIVE mg/dL
Hgb urine dipstick: NEGATIVE
KETONES UR: NEGATIVE mg/dL
Leukocytes, UA: NEGATIVE
NITRITE: NEGATIVE
Protein, ur: NEGATIVE mg/dL
Specific Gravity, Urine: 1.011 (ref 1.005–1.030)
UROBILINOGEN UA: 1 mg/dL (ref 0.0–1.0)
pH: 6.5 (ref 5.0–8.0)

## 2013-09-09 LAB — I-STAT TROPONIN, ED: TROPONIN I, POC: 0 ng/mL (ref 0.00–0.08)

## 2013-09-09 LAB — TSH: TSH: 1.24 u[IU]/mL (ref 0.350–4.500)

## 2013-09-09 MED ORDER — OXYCODONE-ACETAMINOPHEN 5-325 MG PO TABS
2.0000 | ORAL_TABLET | Freq: Once | ORAL | Status: AC
  Administered 2013-09-09: 2 via ORAL
  Filled 2013-09-09: qty 2

## 2013-09-09 MED ORDER — LORAZEPAM 2 MG/ML IJ SOLN
1.0000 mg | INTRAMUSCULAR | Status: DC | PRN
  Administered 2013-09-09: 1 mg via INTRAVENOUS
  Filled 2013-09-09: qty 1

## 2013-09-09 MED ORDER — MELOXICAM 7.5 MG PO TABS: 7.5000 mg | ORAL_TABLET | Freq: Every day | ORAL | Status: DC

## 2013-09-09 MED ORDER — OXYCODONE HCL 5 MG PO TABS: 5.0000 mg | ORAL_TABLET | ORAL | Status: DC | PRN

## 2013-09-09 NOTE — Discharge Instructions (Signed)
Back Pain, Adult Low back pain is very common. About 1 in 5 people have back pain.The cause of low back pain is rarely dangerous. The pain often gets better over time.About half of people with a sudden onset of back pain feel better in just 2 weeks. About 8 in 10 people feel better by 6 weeks.  CAUSES Some common causes of back pain include:  Strain of the muscles or ligaments supporting the spine.  Wear and tear (degeneration) of the spinal discs.  Arthritis.  Direct injury to the back. DIAGNOSIS Most of the time, the direct cause of low back pain is not known.However, back pain can be treated effectively even when the exact cause of the pain is unknown.Answering your caregiver's questions about your overall health and symptoms is one of the most accurate ways to make sure the cause of your pain is not dangerous. If your caregiver needs more information, he or she may order lab work or imaging tests (X-rays or MRIs).However, even if imaging tests show changes in your back, this usually does not require surgery. HOME CARE INSTRUCTIONS For many people, back pain returns.Since low back pain is rarely dangerous, it is often a condition that people can learn to [facility] their own.   Remain active. It is stressful on the back to sit or stand in one place. Do not sit, drive, or stand in one place for more than 30 minutes at a time. Take short walks on level surfaces as soon as pain allows.Try to increase the length of time you walk each day.  Do not stay in bed.Resting more than 1 or 2 days can delay your recovery.  Do not avoid exercise or work.Your body is made to move.It is not dangerous to be active, even though your back may hurt.Your back will likely heal faster if you return to being active before your pain is gone.  Pay attention to your body when you bend and lift. Many people have less discomfortwhen lifting if they bend their knees, keep the load close to their bodies,and  avoid twisting. Often, the most comfortable positions are those that put less stress on your recovering back.  Find a comfortable position to sleep. Use a firm mattress and lie on your side with your knees slightly bent. If you lie on your back, put a pillow under your knees.  Only take over-the-counter or prescription medicines as directed by your caregiver. Over-the-counter medicines to reduce pain and inflammation are often the most helpful.Your caregiver may prescribe muscle relaxant drugs.These medicines help dull your pain so you can more quickly return to your normal activities and healthy exercise.  Put ice on the injured area.  Put ice in a plastic bag.  Place a towel between your skin and the bag.  Leave the ice on for 15-20 minutes, 03-04 times a day for the first 2 to 3 days. After that, ice and heat may be alternated to reduce pain and spasms.  Ask your caregiver about trying back exercises and gentle massage. This may be of some benefit.  Avoid feeling anxious or stressed.Stress increases muscle tension and can worsen back pain.It is important to recognize when you are anxious or stressed and learn ways to manage it.Exercise is a great option. SEEK MEDICAL CARE IF:  You have pain that is not relieved with rest or medicine.  You have pain that does not improve in 1 week.  You have new symptoms.  You are generally not feeling well. SEEK  IMMEDIATE MEDICAL CARE IF:   You have pain that radiates from your back into your legs.  You develop new bowel or bladder control problems.  You have unusual weakness or numbness in your arms or legs.  You develop nausea or vomiting.  You develop abdominal pain.  You feel faint. Document Released: 04/05/2005 Document Revised: 10/05/2011 Document Reviewed: 08/24/2010 [facility] Patient Information 2014 [place], [place].  Spinal Stenosis Spinal stenosis is when the open spaces between the bones of your spine (vertebrae) get  smaller (narrow). It is caused by bone pushing on the open spaces of your spine. This puts pressure on your spine and the nerves in your spine. You may be given medicine to lessen the puffiness (inflammation) in your nerves. Other times, surgery is needed. HOME CARE  Change positions when you sit, stand, and lie. This can help take pressure off your nerves.  Do exercises as told by your doctor to strengthen the middle part of your body.  Lose weight if your doctor suggests it. This takes pressure off your spine.  Take all medicine as told by your doctor. MAKE SURE YOU:  Understand these instructions.  Will watch your condition.  Will get help right away if you are not doing well or get worse. Document Released: 07/30/2010 Document Revised: 12/06/2012 Document Reviewed: 07/14/2012 [facility] Patient Information 2014 [place].

## 2013-09-09 NOTE — ED Notes (Signed)
States she has had ongoing mobility issues. States she got up this morning she went to stand and her left leg gave way and she fell in the floor. She has pain to the left lateral upper thigh. She also states she has not felt well and has had generalized weakness for about a month

## 2013-09-09 NOTE — ED Notes (Signed)
Served pt family coffee.

## 2013-09-09 NOTE — ED Notes (Signed)
Dr Jeneen Rinks in to reeval pt
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
## 2013-09-09 NOTE — ED Notes (Addendum)
Pt was able to complete mri.

## 2013-09-09 NOTE — ED Notes (Signed)
Patient with multiple medical complaints. She complains that her left leg (has had thigh swelling for a month, weakness for weeks).gave way causing her to fall this morning. States her mobility at home has gone from a cane to a walker and now to a electric chair. States she is able to stand and pivot onto a potty chair. She has a home health aid that comes in and helps her with meds, bathing, mobility, meals etc. She denies cp or sob at this time. She is alert and oriented. She is warm and dry. Sinus rhythm on the monitor

## 2013-09-09 NOTE — ED Notes (Signed)
Spoke to [name] with mri. She is going to come assess pt to be sure she will fit in the scanner before meds are given

## 2013-09-09 NOTE — ED Notes (Signed)
Patient continues in [place]

## 2013-09-09 NOTE — ED Notes (Signed)
PT MOVED TO POD C TO WAKE UP FURTHER AND TO BE TRANSPORTED HOME BY [facility]. REPORT GIVEN TO BECKY

## 2013-09-09 NOTE — ED Notes (Signed)
Patient placed flat in bed to be sure she will be able to tolerated lying flat in the scanner.

## 2013-09-10 LAB — URINE CULTURE: Colony Count: 70000

## 2013-09-25 ENCOUNTER — Telehealth: Payer: Self-pay | Admitting: *Deleted

## 2013-09-25 MED ORDER — OXYCODONE-ACETAMINOPHEN 10-325 MG PO TABS: 1.0000 | ORAL_TABLET | ORAL | Status: DC | PRN

## 2013-09-25 NOTE — Telephone Encounter (Signed)
i printed 

## 2013-09-25 NOTE — Telephone Encounter (Signed)
Checking to see if RX is ready pain medication Oxcy

## 2013-09-25 NOTE — Telephone Encounter (Signed)
Pt requesting a refill on Oxycodone. Pt was last seen on and medication was last filled on 08/07/2013. Thanks!

## 2013-09-25 NOTE — Telephone Encounter (Signed)
Noted, pt advised that Rx is ready for pick up. Rx placed up front.

## 2013-09-27 DIAGNOSIS — Z0289 Encounter for other administrative examinations: Secondary | ICD-10-CM

## 2013-10-11 ENCOUNTER — Other Ambulatory Visit: Payer: Self-pay | Admitting: Cardiology

## 2013-10-29 ENCOUNTER — Encounter (HOSPITAL_COMMUNITY): Payer: Self-pay | Admitting: Emergency Medicine

## 2013-10-29 ENCOUNTER — Telehealth: Payer: Self-pay

## 2013-10-29 ENCOUNTER — Emergency Department (HOSPITAL_COMMUNITY)
Admission: EM | Admit: 2013-10-29 | Discharge: 2013-10-29 | Disposition: A | Discharge status: Home / Self Care | Payer: Medicare PPO | Attending: Emergency Medicine | Admitting: Emergency Medicine

## 2013-10-29 ENCOUNTER — Emergency Department (HOSPITAL_COMMUNITY): Payer: Medicare PPO

## 2013-10-29 DIAGNOSIS — I10 Essential (primary) hypertension: Secondary | ICD-10-CM | POA: Insufficient documentation

## 2013-10-29 DIAGNOSIS — Z860100 Personal history of colon polyps, unspecified: Secondary | ICD-10-CM | POA: Insufficient documentation

## 2013-10-29 DIAGNOSIS — F17200 Nicotine dependence, unspecified, uncomplicated: Secondary | ICD-10-CM | POA: Insufficient documentation

## 2013-10-29 DIAGNOSIS — G8929 Other chronic pain: Secondary | ICD-10-CM | POA: Insufficient documentation

## 2013-10-29 DIAGNOSIS — R609 Edema, unspecified: Secondary | ICD-10-CM

## 2013-10-29 DIAGNOSIS — G43909 Migraine, unspecified, not intractable, without status migrainosus: Secondary | ICD-10-CM | POA: Insufficient documentation

## 2013-10-29 DIAGNOSIS — K219 Gastro-esophageal reflux disease without esophagitis: Secondary | ICD-10-CM | POA: Insufficient documentation

## 2013-10-29 DIAGNOSIS — G40909 Epilepsy, unspecified, not intractable, without status epilepticus: Secondary | ICD-10-CM

## 2013-10-29 DIAGNOSIS — R197 Diarrhea, unspecified: Secondary | ICD-10-CM | POA: Insufficient documentation

## 2013-10-29 DIAGNOSIS — Z794 Long term (current) use of insulin: Secondary | ICD-10-CM | POA: Insufficient documentation

## 2013-10-29 DIAGNOSIS — F3289 Other specified depressive episodes: Secondary | ICD-10-CM | POA: Insufficient documentation

## 2013-10-29 DIAGNOSIS — R569 Unspecified convulsions: Secondary | ICD-10-CM | POA: Insufficient documentation

## 2013-10-29 DIAGNOSIS — I252 Old myocardial infarction: Secondary | ICD-10-CM | POA: Insufficient documentation

## 2013-10-29 DIAGNOSIS — Z8673 Personal history of transient ischemic attack (TIA), and cerebral infarction without residual deficits: Secondary | ICD-10-CM | POA: Insufficient documentation

## 2013-10-29 DIAGNOSIS — IMO0002 Reserved for concepts with insufficient information to code with codable children: Secondary | ICD-10-CM | POA: Insufficient documentation

## 2013-10-29 DIAGNOSIS — E119 Type 2 diabetes mellitus without complications: Secondary | ICD-10-CM | POA: Insufficient documentation

## 2013-10-29 DIAGNOSIS — F329 Major depressive disorder, single episode, unspecified: Secondary | ICD-10-CM | POA: Insufficient documentation

## 2013-10-29 DIAGNOSIS — J209 Acute bronchitis, unspecified: Secondary | ICD-10-CM | POA: Insufficient documentation

## 2013-10-29 DIAGNOSIS — J4489 Other specified chronic obstructive pulmonary disease: Secondary | ICD-10-CM | POA: Insufficient documentation

## 2013-10-29 DIAGNOSIS — I509 Heart failure, unspecified: Secondary | ICD-10-CM | POA: Insufficient documentation

## 2013-10-29 DIAGNOSIS — R21 Rash and other nonspecific skin eruption: Secondary | ICD-10-CM

## 2013-10-29 DIAGNOSIS — E669 Obesity, unspecified: Secondary | ICD-10-CM | POA: Insufficient documentation

## 2013-10-29 DIAGNOSIS — M129 Arthropathy, unspecified: Secondary | ICD-10-CM | POA: Insufficient documentation

## 2013-10-29 DIAGNOSIS — J208 Acute bronchitis due to other specified organisms: Secondary | ICD-10-CM

## 2013-10-29 DIAGNOSIS — M791 Myalgia, unspecified site: Secondary | ICD-10-CM

## 2013-10-29 DIAGNOSIS — E7800 Pure hypercholesterolemia, unspecified: Secondary | ICD-10-CM | POA: Insufficient documentation

## 2013-10-29 DIAGNOSIS — Z79899 Other long term (current) drug therapy: Secondary | ICD-10-CM | POA: Insufficient documentation

## 2013-10-29 DIAGNOSIS — J449 Chronic obstructive pulmonary disease, unspecified: Secondary | ICD-10-CM | POA: Insufficient documentation

## 2013-10-29 DIAGNOSIS — Z791 Long term (current) use of non-steroidal anti-inflammatories (NSAID): Secondary | ICD-10-CM | POA: Insufficient documentation

## 2013-10-29 LAB — HEPATIC FUNCTION PANEL
ALBUMIN: 3 g/dL — AB (ref 3.5–5.2)
ALT: 11 U/L (ref 0–35)
AST: 22 U/L (ref 0–37)
Alkaline Phosphatase: 97 U/L (ref 39–117)
Bilirubin, Direct: <0.2 mg/dL (ref 0.0–0.3)
Total Bilirubin: 0.4 mg/dL (ref 0.3–1.2)
Total Protein: 7.9 g/dL (ref 6.0–8.3)

## 2013-10-29 LAB — DIFFERENTIAL
BASOS PCT: 0 % (ref 0–1)
Basophils Absolute: 0 10*3/uL (ref 0.0–0.1)
Eosinophils Absolute: 0.1 10*3/uL (ref 0.0–0.7)
Eosinophils Relative: 1 % (ref 0–5)
LYMPHS ABS: 1.5 10*3/uL (ref 0.7–4.0)
Lymphocytes Relative: 13 % (ref 12–46)
MONOS PCT: 7 % (ref 3–12)
Monocytes Absolute: 0.8 10*3/uL (ref 0.1–1.0)
NEUTROS ABS: 9 10*3/uL — AB (ref 1.7–7.7)
NEUTROS PCT: 79 % — AB (ref 43–77)

## 2013-10-29 LAB — CBC
HCT: 37.7 % (ref 36.0–46.0)
HEMOGLOBIN: 12.1 g/dL (ref 12.0–15.0)
MCH: 32.6 pg (ref 26.0–34.0)
MCHC: 32.1 g/dL (ref 30.0–36.0)
MCV: 101.6 fL — AB (ref 78.0–100.0)
Platelets: 228 10*3/uL (ref 150–400)
RBC: 3.71 MIL/uL — AB (ref 3.87–5.11)
RDW: 13.2 % (ref 11.5–15.5)
WBC: 11.8 10*3/uL — ABNORMAL HIGH (ref 4.0–10.5)

## 2013-10-29 LAB — BASIC METABOLIC PANEL
Anion gap: 12 (ref 5–15)
BUN: 9 mg/dL (ref 6–23)
CHLORIDE: 96 meq/L (ref 96–112)
CO2: 29 mEq/L (ref 19–32)
Calcium: 9 mg/dL (ref 8.4–10.5)
Creatinine, Ser: 0.95 mg/dL (ref 0.50–1.10)
GFR calc Af Amer: 68 mL/min — ABNORMAL LOW (ref >90)
GFR calc non Af Amer: 59 mL/min — ABNORMAL LOW (ref >90)
GLUCOSE: 92 mg/dL (ref 70–99)
POTASSIUM: 3.8 meq/L (ref 3.7–5.3)
Sodium: 137 mEq/L (ref 137–147)

## 2013-10-29 LAB — URINALYSIS, ROUTINE W REFLEX MICROSCOPIC
Bilirubin Urine: NEGATIVE
Glucose, UA: NEGATIVE mg/dL
Hgb urine dipstick: NEGATIVE
Ketones, ur: NEGATIVE mg/dL
LEUKOCYTES UA: NEGATIVE
Nitrite: NEGATIVE
PH: 6 (ref 5.0–8.0)
Protein, ur: NEGATIVE mg/dL
SPECIFIC GRAVITY, URINE: 1.013 (ref 1.005–1.030)
Urobilinogen, UA: 1 mg/dL (ref 0.0–1.0)

## 2013-10-29 LAB — I-STAT CG4 LACTIC ACID, ED: Lactic Acid, Venous: 2.61 mmol/L — ABNORMAL HIGH (ref 0.5–2.2)

## 2013-10-29 LAB — I-STAT TROPONIN, ED: TROPONIN I, POC: 0.01 ng/mL (ref 0.00–0.08)

## 2013-10-29 LAB — CBG MONITORING, ED: Glucose-Capillary: 103 mg/dL — ABNORMAL HIGH (ref 70–99)

## 2013-10-29 MED ORDER — FENTANYL CITRATE 0.05 MG/ML IJ SOLN
50.0000 ug | Freq: Once | INTRAMUSCULAR | Status: AC
  Administered 2013-10-29: 50 ug via INTRAVENOUS
  Filled 2013-10-29: qty 2

## 2013-10-29 MED ORDER — IPRATROPIUM-ALBUTEROL 0.5-2.5 (3) MG/3ML IN SOLN
3.0000 mL | Freq: Once | RESPIRATORY_TRACT | Status: AC
  Administered 2013-10-29: 3 mL via RESPIRATORY_TRACT
  Filled 2013-10-29: qty 3

## 2013-10-29 MED ORDER — AMOXICILLIN 500 MG PO CAPS: 1000.0000 mg | ORAL_CAPSULE | Freq: Two times a day (BID) | ORAL | Status: DC

## 2013-10-29 MED ORDER — CLOTRIMAZOLE 1 % EX CREA: 1.0000 "application " | TOPICAL_CREAM | Freq: Two times a day (BID) | CUTANEOUS | Status: DC

## 2013-10-29 MED ORDER — OMEPRAZOLE 40 MG PO CPDR: 40.0000 mg | DELAYED_RELEASE_CAPSULE | Freq: Every day | ORAL | Status: DC

## 2013-10-29 MED ORDER — LEVETIRACETAM 750 MG PO TABS: 750.0000 mg | ORAL_TABLET | Freq: Two times a day (BID) | ORAL | Status: DC

## 2013-10-29 MED ORDER — KETOROLAC TROMETHAMINE 30 MG/ML IJ SOLN
30.0000 mg | Freq: Once | INTRAMUSCULAR | Status: AC
  Administered 2013-10-29: 30 mg via INTRAVENOUS
  Filled 2013-10-29: qty 1

## 2013-10-29 MED ORDER — NAPROXEN 375 MG PO TABS: 375.0000 mg | ORAL_TABLET | Freq: Two times a day (BID) | ORAL | Status: DC

## 2013-10-29 NOTE — ED Notes (Signed)
Reported Lactic acid of 2.39 to Dr Roxanne Mins

## 2013-10-29 NOTE — Discharge Instructions (Signed)
Increase your Keppra to 1500  Mg/day.  Acute Bronchitis Bronchitis is inflammation of the airways that extend from the windpipe into the lungs (bronchi). The inflammation often causes mucus to develop. This leads to a cough, which is the most common symptom of bronchitis.  In acute bronchitis, the condition usually develops suddenly and goes away over time, usually in a couple weeks. Smoking, allergies, and asthma can make bronchitis worse. Repeated episodes of bronchitis may cause further lung problems.  CAUSES Acute bronchitis is most often caused by the same virus that causes a cold. The virus can spread from person to person (contagious).  SIGNS AND SYMPTOMS   Cough.   Fever.   Coughing up mucus.   Body aches.   Chest congestion.   Chills.   Shortness of breath.   Sore throat.  DIAGNOSIS  Acute bronchitis is usually diagnosed through a physical exam. Tests, such as chest X-rays, are sometimes done to rule out other conditions.  TREATMENT  Acute bronchitis usually goes away in a couple weeks. Often times, no medical treatment is necessary. Medicines are sometimes given for relief of fever or cough. Antibiotics are usually not needed but may be prescribed in certain situations. In some cases, an inhaler may be recommended to help reduce shortness of breath and control the cough. A cool mist vaporizer may also be used to help thin bronchial secretions and make it easier to clear the chest.  HOME CARE INSTRUCTIONS  Get plenty of rest.   Drink enough fluids to keep your urine clear or pale yellow (unless you have a medical condition that requires fluid restriction). Increasing fluids may help thin your secretions and will prevent dehydration.   Only take over-the-counter or prescription medicines as directed by your health care provider.   Avoid smoking and secondhand smoke. Exposure to cigarette smoke or irritating chemicals will make bronchitis worse. If you are a  smoker, consider using nicotine gum or skin patches to help control withdrawal symptoms. Quitting smoking will help your lungs heal faster.   Reduce the chances of another bout of acute bronchitis by washing your hands frequently, avoiding people with cold symptoms, and trying not to touch your hands to your mouth, nose, or eyes.   Follow up with your health care provider as directed.  SEEK MEDICAL CARE IF: Your symptoms do not improve after 1 week of treatment.  SEEK IMMEDIATE MEDICAL CARE IF:  You develop an increased fever or chills.   You have chest pain.   You have severe shortness of breath.  You have bloody sputum.   You develop dehydration.  You develop fainting.  You develop repeated vomiting.  You develop a severe headache. MAKE SURE YOU:   Understand these instructions.  Will watch your condition.  Will get help right away if you are not doing well or get worse. Document Released: 05/13/2004 Document Revised: 12/06/2012 Document Reviewed: 09/26/2012 [facility] Patient Information 2015 [place], [place]. This information is not intended to replace advice given to you by your health care provider. Make sure you discuss any questions you have with your health care provider.  Seizure, Adult A seizure is abnormal electrical activity in the brain. Seizures usually last from 30 seconds to 2 minutes. There are various types of seizures. Before a seizure, you may have a warning sensation (aura) that a seizure is about to occur. An aura may include the following symptoms:   Fear or anxiety.  Nausea.  Feeling like the room is spinning (vertigo).  Vision changes, such as seeing flashing lights or spots. Common symptoms during a seizure include:  A change in attention or behavior (altered mental status).  Convulsions with rhythmic jerking movements.  Drooling.  Rapid eye movements.  Grunting.  Loss of bladder and bowel control.  Bitter taste in the  mouth.  Tongue biting. After a seizure, you may feel confused and sleepy. You may also have an injury resulting from convulsions during the seizure. HOME CARE INSTRUCTIONS   If you are given medicines, take them exactly as prescribed by your health care provider.  Keep all follow-up appointments as directed by your health care provider.  Do not swim or drive or engage in risky activity during which a seizure could cause further injury to you or others until your health care provider says it is OK.  Get adequate rest.  Teach friends and family what to do if you have a seizure. They should:  Lay you on the ground to prevent a fall.  Put a cushion under your head.  Loosen any tight clothing around your neck.  Turn you on your side. If vomiting occurs, this helps keep your airway clear.  Stay with you until you recover.  Know whether or not you need emergency care. SEEK IMMEDIATE MEDICAL CARE IF:  The seizure lasts longer than 5 minutes.  The seizure is severe or you do not wake up immediately after the seizure.  You have an altered mental status after the seizure.  You are having more frequent or worsening seizures. Someone should drive you to the emergency department or call local emergency services (911 in [place] MAKE SURE YOU:  Understand these instructions.  Will watch your condition.  Will get help right away if you are not doing well or get worse. Document Released: 04/02/2000 Document Revised: 01/24/2013 Document Reviewed: 11/15/2012 [facility] Patient Information 2015 [place], [place]. This information is not intended to replace advice given to you by your health care provider. Make sure you discuss any questions you have with your health care provider.  Amoxicillin capsules or tablets What is this medicine? AMOXICILLIN (a mox i SIL in) is a penicillin antibiotic. It is used to treat certain kinds of bacterial infections. It will not work for colds, flu, or other  viral infections. This medicine may be used for other purposes; ask your health care provider or pharmacist if you have questions. COMMON BRAND NAME(S): Amoxil, Moxilin, Sumox, Trimox What should I tell my health care provider before I take this medicine? They need to know if you have any of these conditions: -asthma -kidney disease -an unusual or allergic reaction to amoxicillin, other penicillins, cephalosporin antibiotics, other medicines, foods, dyes, or preservatives -pregnant or trying to get pregnant -breast-feeding How should I use this medicine? Take this medicine by mouth with a glass of water. Follow the directions on your prescription label. You may take this medicine with food or on an empty stomach. Take your medicine at regular intervals. Do not take your medicine more often than directed. Take all of your medicine as directed even if you think your are better. Do not skip doses or stop your medicine early. Talk to your pediatrician regarding the use of this medicine in children. While this drug may be prescribed for selected conditions, precautions do apply. Overdosage: If you think you have taken too much of this medicine contact a poison control center or emergency room at once. NOTE: This medicine is only for you. Do not share this medicine with  others. What if I miss a dose? If you miss a dose, take it as soon as you can. If it is almost time for your next dose, take only that dose. Do not take double or extra doses. What may interact with this medicine? -amiloride -birth control pills -chloramphenicol -macrolides -probenecid -sulfonamides -tetracyclines This list may not describe all possible interactions. Give your health care provider a list of all the medicines, herbs, non-prescription drugs, or dietary supplements you use. Also tell them if you smoke, drink alcohol, or use illegal drugs. Some items may interact with your medicine. What should I watch for while using  this medicine? Tell your doctor or health care professional if your symptoms do not improve in 2 or 3 days. Take all of the doses of your medicine as directed. Do not skip doses or stop your medicine early. If you are diabetic, you may get a false positive result for sugar in your urine with certain brands of urine tests. Check with your doctor. Do not treat diarrhea with over-the-counter products. Contact your doctor if you have diarrhea that lasts more than 2 days or if the diarrhea is severe and watery. What side effects may I notice from receiving this medicine? Side effects that you should report to your doctor or health care professional as soon as possible: -allergic reactions like skin rash, itching or hives, swelling of the face, lips, or tongue -breathing problems -dark urine -redness, blistering, peeling or loosening of the skin, including inside the mouth -seizures -severe or watery diarrhea -trouble passing urine or change in the amount of urine -unusual bleeding or bruising -unusually weak or tired -yellowing of the eyes or skin Side effects that usually do not require medical attention (report to your doctor or health care professional if they continue or are bothersome): -dizziness -headache -stomach upset -trouble sleeping This list may not describe all possible side effects. Call your doctor for medical advice about side effects. You may report side effects to FDA at 1-800-FDA-1088. Where should I keep my medicine? Keep out of the reach of children. Store between 68 and 77 degrees F (20 and 25 degrees C). Keep bottle closed tightly. Throw away any unused medicine after the expiration date. NOTE: This sheet is a summary. It may not cover all possible information. If you have questions about this medicine, talk to your doctor, pharmacist, or health care provider.  2015, [employer]. (2007-06-27 14:10:59)  Clotrimazole skin cream, lotion, or solution What is  this medicine? CLOTRIMAZOLE (kloe TRIM a zole) is an antifungal medicine. It is used to treat certain kinds of fungal or yeast infections of the skin. This medicine may be used for other purposes; ask your health care provider or pharmacist if you have questions. COMMON BRAND NAME(S): Anti-Fungal, Antifungal, Cruex, Desenex, Fungoid, Lotrimin, Lotrimin AF What should I tell my health care provider before I take this medicine? They need to know if you have any of these conditions: -an unusual or allergic reaction to clotrimazole, other antifungals or medicines, foods, dyes or preservatives -pregnant or trying to get pregnant -breast-feeding How should I use this medicine? This medicine is for external use only. Do not take by mouth. Follow the directions on the prescription label. Wash your hands before and after use. If treating a hand or nail infection, wash hands before use only. Apply a thin layer to the affected area and a small amount to the surrounding area. Rub in gently. Do not get this medicine in your eyes.  If you do, rinse out with plenty of cool tap water. Use this medicine at regular intervals. Do not use more often than directed. Finish the full course prescribed by your doctor or health care professional even if you think you are better. Do not stop using except on your doctor's advice. Talk to your pediatrician regarding the use of this medicine in children. While this drug has been used in young children for selected conditions, precautions do apply. Overdosage: If you think you have taken too much of this medicine contact a poison control center or emergency room at once. NOTE: This medicine is only for you. Do not share this medicine with others. What if I miss a dose? If you miss a dose, use it as soon as you can. If it is almost time for your next dose, use only that dose. Do not use double or extra doses. What may interact with this medicine? -amphotericin b -topical products  that have nystatin This list may not describe all possible interactions. Give your health care provider a list of all the medicines, herbs, non-prescription drugs, or dietary supplements you use. Also tell them if you smoke, drink alcohol, or use illegal drugs. Some items may interact with your medicine. What should I watch for while using this medicine? Tell your doctor or health care professional if your symptoms do not start to improve after 7 days. Do not self-medicate for more than one week. If you are using this medicine for 'jock itch' be sure to dry the groin completely after bathing. Do not wear underwear that is tight-fitting or made from synthetic fibers like rayon or nylon. Wear loose-fitting, cotton underwear. If you are using this medicine for athlete's foot be sure to dry your feet carefully after bathing, especially between the toes. Do not wear socks made from wool or synthetic materials like rayon or nylon. Wear clean cotton socks and change them at least once a day, change them more if your feet sweat a lot. Also, try to wear sandals or shoes that are well-ventilated. What side effects may I notice from receiving this medicine? Side effects that usually do not require medical attention (report to your doctor or health care professional if they continue or are bothersome): -allergic reactions like skin rash, itching or hives, swelling of the face, lips, or tongue -skin irritation, burning This list may not describe all possible side effects. Call your doctor for medical advice about side effects. You may report side effects to FDA at 1-800-FDA-1088. Where should I keep my medicine? Keep out of the reach of children. Store at room temperature between 2 to 30 degrees C (36 to 86 degrees F). Do not freeze. Throw away any unused medicine after the expiration date. NOTE: This sheet is a summary. It may not cover all possible information. If you have questions about this medicine, talk to  your doctor, pharmacist, or health care provider.  2015, [employer]. (2007-07-10 16:53:51)

## 2013-10-29 NOTE — ED Notes (Signed)
Lactic Acid called and results given to [facility] RN.  She stated she would report to EDP.
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
## 2013-10-29 NOTE — Telephone Encounter (Signed)
Called pt and left message to discuss what she is requesting for her pain.

## 2013-10-29 NOTE — Telephone Encounter (Signed)
Rx sent electronically to pharmacy. 

## 2013-10-29 NOTE — ED Notes (Signed)
Pt arrives via EMS from home. Family found pt unresponsive on the bed and called EMS. Family found CBG to be 70's. Family gave juice and sugar, EMS CBG was 125. Pt also c/o difficulty breathing, thick green mucous. C/o diarrhea x3 days. Hx chronic bronchitis. 130/80 HR 84 100% on saline neb. 92% RA. 20 RAC. Lungs sound congested.

## 2013-10-29 NOTE — Telephone Encounter (Signed)
Received a refill request for Omeprazole 40 mg. Medication is listed under historical provider. Ok to refill? Thanks!

## 2013-10-29 NOTE — Telephone Encounter (Signed)
ok 

## 2013-11-02 ENCOUNTER — Telehealth: Payer: Self-pay

## 2013-11-02 MED ORDER — OXYCODONE HCL 5 MG PO TABS: 5.0000 mg | ORAL_TABLET | ORAL | Status: DC | PRN

## 2013-11-02 NOTE — Telephone Encounter (Signed)
Pt requesting a refill on Oxycodone. Pt was last seen on 08/07/2013 and med last filled on 09/09/2013.   Thanks!

## 2013-11-02 NOTE — Telephone Encounter (Signed)
Rx put up front. Script to be pick up this afternoon.

## 2013-11-02 NOTE — Telephone Encounter (Signed)
i printed 

## 2013-11-07 ENCOUNTER — Telehealth: Payer: Self-pay | Admitting: Endocrinology

## 2013-11-07 NOTE — Telephone Encounter (Signed)
Medical supply place informed that we have received form. Advised that after form is filled out we will fax back.

## 2013-11-07 NOTE — Telephone Encounter (Signed)
Nell from [employer] [facility] calling to inquire on the back brace form # [phone] Ref # [id number]

## 2013-11-09 ENCOUNTER — Telehealth: Payer: Self-pay | Admitting: Endocrinology

## 2013-11-09 NOTE — Telephone Encounter (Signed)
Unable to reach pt. Requested call back to discuss.

## 2013-11-09 NOTE — Telephone Encounter (Signed)
Patients daughter called wanting to talk with Megan  Her mother is in severe pain; pain meds are not helping  What is she to do?  Please advise   Thank You

## 2013-11-15 ENCOUNTER — Telehealth: Payer: Self-pay | Admitting: Endocrinology

## 2013-11-15 NOTE — Telephone Encounter (Signed)
Pt has 20 oxycodones left she is taking the max 4 per day for her pain and does need a refill called in please

## 2013-11-16 NOTE — Telephone Encounter (Signed)
Called and spoke with pt's daughter. She states the pt has an upcoming appointment on 11/27/2013. For right now daughter would like to hold off on doing anything medication wise until she can be seen by MD. Advised daughter if over the weekend pain got worse to call our office on Monday and we would go from there.

## 2013-11-23 ENCOUNTER — Encounter: Payer: Self-pay | Admitting: Endocrinology

## 2013-11-27 ENCOUNTER — Encounter: Payer: Self-pay | Admitting: Endocrinology

## 2013-11-27 ENCOUNTER — Ambulatory Visit (INDEPENDENT_AMBULATORY_CARE_PROVIDER_SITE_OTHER): Payer: Medicare PPO | Admitting: Endocrinology

## 2013-11-27 VITALS — BP 128/78 | HR 80 | Temp 98.8°F

## 2013-11-27 DIAGNOSIS — M25552 Pain in left hip: Principal | ICD-10-CM

## 2013-11-27 DIAGNOSIS — M25559 Pain in unspecified hip: Secondary | ICD-10-CM

## 2013-11-27 DIAGNOSIS — E1059 Type 1 diabetes mellitus with other circulatory complications: Secondary | ICD-10-CM

## 2013-11-27 DIAGNOSIS — G8929 Other chronic pain: Secondary | ICD-10-CM

## 2013-11-27 DIAGNOSIS — R0602 Shortness of breath: Secondary | ICD-10-CM

## 2013-11-27 LAB — BASIC METABOLIC PANEL
BUN: 10 mg/dL (ref 6–23)
CALCIUM: 8.7 mg/dL (ref 8.4–10.5)
CO2: 25 mEq/L (ref 19–32)
Chloride: 102 mEq/L (ref 96–112)
Creatinine, Ser: 1 mg/dL (ref 0.4–1.2)
GFR: 68.5 mL/min (ref >60.00)
GLUCOSE: 134 mg/dL — AB (ref 70–99)
Potassium: 3.4 mEq/L — ABNORMAL LOW (ref 3.5–5.1)
Sodium: 138 mEq/L (ref 135–145)

## 2013-11-27 LAB — HEMOGLOBIN A1C: Hgb A1c MFr Bld: 8.6 % — ABNORMAL HIGH (ref 4.6–6.5)

## 2013-11-27 LAB — BRAIN NATRIURETIC PEPTIDE: Pro B Natriuretic peptide (BNP): 60 pg/mL (ref 0.0–100.0)

## 2013-11-27 MED ORDER — CEFUROXIME AXETIL 250 MG PO TABS: 250.0000 mg | ORAL_TABLET | Freq: Two times a day (BID) | ORAL | Status: AC

## 2013-11-27 MED ORDER — OXYCODONE HCL 5 MG PO TABS: 5.0000 mg | ORAL_TABLET | ORAL | Status: DC | PRN

## 2013-11-27 NOTE — Patient Instructions (Addendum)
Please see an orthopedic specialist.  you will receive a phone call, about a day and time for an appointment. Here is a refill of the pain pill.   i have sent a prescription to your pharmacy, for an antibiotic pill.   Please continue to use the inhaler, as needed. blood tests are being requested for you today.  We'll contact you with results.  Please come back for a follow-up appointment in 3 months.   I'll do the diabetic shoes form.  Please ask that it be sent here.
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
## 2013-12-11 ENCOUNTER — Other Ambulatory Visit: Payer: Self-pay | Admitting: *Deleted

## 2013-12-28 ENCOUNTER — Telehealth: Payer: Self-pay | Admitting: Endocrinology

## 2013-12-28 ENCOUNTER — Other Ambulatory Visit: Payer: Self-pay | Admitting: Endocrinology

## 2013-12-28 MED ORDER — OXYCODONE HCL 5 MG PO TABS: 5.0000 mg | ORAL_TABLET | ORAL | Status: DC | PRN

## 2013-12-28 NOTE — Telephone Encounter (Signed)
See below, pt was last seen and medication was last refilled on 11/27/2013., Thanks!

## 2013-12-28 NOTE — Telephone Encounter (Signed)
i printed 

## 2013-12-28 NOTE — Telephone Encounter (Signed)
Pt's daughter advised that rx is ready for pick up. Script placed up front.

## 2013-12-28 NOTE — Telephone Encounter (Signed)
Patients daughter called stating it was time for her to get a refill on pain meds    Please advise   Thank you

## 2014-01-17 ENCOUNTER — Other Ambulatory Visit: Payer: Self-pay | Admitting: Endocrinology

## 2014-01-23 ENCOUNTER — Telehealth: Payer: Self-pay | Admitting: *Deleted

## 2014-01-23 NOTE — Telephone Encounter (Signed)
Requested call back to discuss.  

## 2014-01-23 NOTE — Telephone Encounter (Signed)
DAUGHTER PRISCILLA CALLED WORRIED ABOUT MOM VERY CONFUSED

## 2014-01-31 ENCOUNTER — Telehealth: Payer: Self-pay | Admitting: Endocrinology

## 2014-01-31 NOTE — Telephone Encounter (Signed)
Patient would like her pain medication refilled  Also when she comes up tomorrow to pick it up it will be a little after 1pm so she can do her urine test    Thank you

## 2014-02-01 MED ORDER — OXYCODONE HCL 5 MG PO TABS: 5.0000 mg | ORAL_TABLET | ORAL | Status: DC | PRN

## 2014-02-01 NOTE — Telephone Encounter (Signed)
See below and please advise, Thanks!  

## 2014-02-01 NOTE — Telephone Encounter (Signed)
Pt advised that rx is ready for pick up rx placed up front.  

## 2014-02-01 NOTE — Telephone Encounter (Signed)
i printed 

## 2014-02-07 ENCOUNTER — Other Ambulatory Visit: Payer: Self-pay | Admitting: Endocrinology

## 2014-02-07 ENCOUNTER — Other Ambulatory Visit: Payer: Self-pay | Admitting: Cardiology

## 2014-02-08 ENCOUNTER — Telehealth: Payer: Self-pay

## 2014-02-08 NOTE — Telephone Encounter (Signed)
Received a refill request Meloxicam 7.5mg . Pt was last seen on 11/27/2013 and med was last refilled on 5/24. Ok to refill? Thanks!

## 2014-02-08 NOTE — Telephone Encounter (Signed)
Lvom advising of below. Pharmacy notified that rx has been d/c.

## 2014-02-08 NOTE — Telephone Encounter (Signed)
please call this pt. Please d/c this med.  It is not good for your kidneys. Please take tylenol, which you can take along with your other oxycodone.

## 2014-02-15 ENCOUNTER — Telehealth: Payer: Self-pay | Admitting: Endocrinology

## 2014-02-15 NOTE — Telephone Encounter (Signed)
If it looks like an infection, go to urgent care. If not, ov here monday

## 2014-02-15 NOTE — Telephone Encounter (Signed)
Pt coming in for ov on 02/18/2014 at 11 am.

## 2014-02-15 NOTE — Telephone Encounter (Signed)
Home health nurse found a lump under Patient her right breast Please advise.

## 2014-02-15 NOTE — Telephone Encounter (Addendum)
Contacted pt's daughter she states the lump was discovered today by the CNA. Spoke with the CNA who stated the lump was hard and red. Stated you could see the blood vessels underneath the lump and it was about 2 1/2 inches in width and 1 1/2 inches in height. The lump was located on the side of her right breast,  Wanted MD to be notified.

## 2014-02-18 ENCOUNTER — Ambulatory Visit: Payer: Medicare PPO | Admitting: Endocrinology

## 2014-02-19 ENCOUNTER — Ambulatory Visit (INDEPENDENT_AMBULATORY_CARE_PROVIDER_SITE_OTHER): Payer: Medicare PPO | Admitting: Endocrinology

## 2014-02-19 VITALS — BP 138/92 | HR 82 | Temp 98.6°F

## 2014-02-19 DIAGNOSIS — E1059 Type 1 diabetes mellitus with other circulatory complications: Secondary | ICD-10-CM

## 2014-02-19 DIAGNOSIS — IMO0002 Reserved for concepts with insufficient information to code with codable children: Secondary | ICD-10-CM

## 2014-02-19 DIAGNOSIS — E1065 Type 1 diabetes mellitus with hyperglycemia: Secondary | ICD-10-CM

## 2014-02-19 DIAGNOSIS — R569 Unspecified convulsions: Secondary | ICD-10-CM

## 2014-02-19 DIAGNOSIS — E1051 Type 1 diabetes mellitus with diabetic peripheral angiopathy without gangrene: Secondary | ICD-10-CM

## 2014-02-19 MED ORDER — CEFUROXIME AXETIL 250 MG PO TABS: 250.0000 mg | ORAL_TABLET | Freq: Two times a day (BID) | ORAL | Status: AC

## 2014-02-19 MED ORDER — CLOTRIMAZOLE-BETAMETHASONE 1-0.05 % EX CREA: 1.0000 "application " | TOPICAL_CREAM | Freq: Three times a day (TID) | CUTANEOUS | Status: DC | PRN
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
## 2014-02-19 NOTE — Patient Instructions (Addendum)
i have sent a prescription to your pharmacy, for a skin cream, for the itching.   blood tests are being requested for you today.  We'll contact you with results.   Please come back for a regular physical appointment in 3 months.   Please continue the same blood pressure medication.  We'll follow your blood pressure.  A diabetes blood test is requested for you today.  We'll contact you with results.   Please reduce the insulin to 400 units each morning.  On this type of insulin schedule, you should eat meals on a regular schedule.  If a meal is missed or significantly delayed, your blood sugar could go low.  Please check your meds at home, to make sure they are the same as today's list.  Please call us if not.   i have sent a prescription to your pharmacy, for an antibiotic pill. I hope you feel better soon.  If you don't feel better by next week, please call back, so i can refer you to an ear-nose-throat specialist.

## 2014-02-20 LAB — HEMOGLOBIN A1C
Hgb A1c MFr Bld: 8.9 % — ABNORMAL HIGH (ref <5.7)
MEAN PLASMA GLUCOSE: 209 mg/dL — AB (ref <117)

## 2014-02-21 DIAGNOSIS — Z0279 Encounter for issue of other medical certificate: Secondary | ICD-10-CM

## 2014-02-26 ENCOUNTER — Other Ambulatory Visit: Payer: Self-pay | Admitting: Cardiology

## 2014-02-26 ENCOUNTER — Other Ambulatory Visit: Payer: Self-pay | Admitting: Endocrinology

## 2014-02-27 ENCOUNTER — Ambulatory Visit: Payer: Medicare PPO | Admitting: Endocrinology

## 2014-02-28 NOTE — Telephone Encounter (Signed)
No follow up necessary (already has appt)

## 2014-02-28 NOTE — Telephone Encounter (Signed)
Patient no showed today's appt. Please advise on how to follow up. °A. No follow up necessary. °B. Follow up urgent. Contact patient immediately. °C. Follow up necessary. Contact patient and schedule visit in ___ days. °D. Follow up advised. Contact patient and schedule visit in ____weeks. ° °

## 2014-03-01 ENCOUNTER — Emergency Department (HOSPITAL_COMMUNITY)
Admission: EM | Admit: 2014-03-01 | Discharge: 2014-03-01 | Disposition: A | Discharge status: Home / Self Care | Payer: Medicare PPO | Attending: Emergency Medicine | Admitting: Emergency Medicine

## 2014-03-01 ENCOUNTER — Encounter (HOSPITAL_COMMUNITY): Payer: Self-pay | Admitting: *Deleted

## 2014-03-01 ENCOUNTER — Emergency Department (HOSPITAL_COMMUNITY): Payer: Medicare PPO

## 2014-03-01 DIAGNOSIS — G43909 Migraine, unspecified, not intractable, without status migrainosus: Secondary | ICD-10-CM | POA: Insufficient documentation

## 2014-03-01 DIAGNOSIS — Z7982 Long term (current) use of aspirin: Secondary | ICD-10-CM | POA: Diagnosis not present

## 2014-03-01 DIAGNOSIS — M1990 Unspecified osteoarthritis, unspecified site: Secondary | ICD-10-CM | POA: Insufficient documentation

## 2014-03-01 DIAGNOSIS — Z8673 Personal history of transient ischemic attack (TIA), and cerebral infarction without residual deficits: Secondary | ICD-10-CM | POA: Diagnosis not present

## 2014-03-01 DIAGNOSIS — Z9981 Dependence on supplemental oxygen: Secondary | ICD-10-CM | POA: Diagnosis not present

## 2014-03-01 DIAGNOSIS — Z720 Tobacco use: Secondary | ICD-10-CM | POA: Diagnosis not present

## 2014-03-01 DIAGNOSIS — Z792 Long term (current) use of antibiotics: Secondary | ICD-10-CM | POA: Insufficient documentation

## 2014-03-01 DIAGNOSIS — Z79899 Other long term (current) drug therapy: Secondary | ICD-10-CM | POA: Insufficient documentation

## 2014-03-01 DIAGNOSIS — I10 Essential (primary) hypertension: Secondary | ICD-10-CM | POA: Diagnosis not present

## 2014-03-01 DIAGNOSIS — F329 Major depressive disorder, single episode, unspecified: Secondary | ICD-10-CM | POA: Diagnosis not present

## 2014-03-01 DIAGNOSIS — I252 Old myocardial infarction: Secondary | ICD-10-CM | POA: Diagnosis not present

## 2014-03-01 DIAGNOSIS — J441 Chronic obstructive pulmonary disease with (acute) exacerbation: Secondary | ICD-10-CM | POA: Insufficient documentation

## 2014-03-01 DIAGNOSIS — G8929 Other chronic pain: Secondary | ICD-10-CM | POA: Diagnosis not present

## 2014-03-01 DIAGNOSIS — E780 Pure hypercholesterolemia: Secondary | ICD-10-CM | POA: Insufficient documentation

## 2014-03-01 DIAGNOSIS — G4733 Obstructive sleep apnea (adult) (pediatric): Secondary | ICD-10-CM | POA: Diagnosis not present

## 2014-03-01 DIAGNOSIS — Z7951 Long term (current) use of inhaled steroids: Secondary | ICD-10-CM | POA: Insufficient documentation

## 2014-03-01 DIAGNOSIS — E669 Obesity, unspecified: Secondary | ICD-10-CM | POA: Diagnosis not present

## 2014-03-01 DIAGNOSIS — I509 Heart failure, unspecified: Secondary | ICD-10-CM | POA: Insufficient documentation

## 2014-03-01 DIAGNOSIS — G40909 Epilepsy, unspecified, not intractable, without status epilepticus: Secondary | ICD-10-CM | POA: Insufficient documentation

## 2014-03-01 DIAGNOSIS — Z791 Long term (current) use of non-steroidal anti-inflammatories (NSAID): Secondary | ICD-10-CM | POA: Diagnosis not present

## 2014-03-01 DIAGNOSIS — K219 Gastro-esophageal reflux disease without esophagitis: Secondary | ICD-10-CM | POA: Diagnosis not present

## 2014-03-01 DIAGNOSIS — R109 Unspecified abdominal pain: Secondary | ICD-10-CM

## 2014-03-01 DIAGNOSIS — Z86010 Personal history of colonic polyps: Secondary | ICD-10-CM | POA: Insufficient documentation

## 2014-03-01 DIAGNOSIS — Z7952 Long term (current) use of systemic steroids: Secondary | ICD-10-CM | POA: Diagnosis not present

## 2014-03-01 DIAGNOSIS — R1084 Generalized abdominal pain: Secondary | ICD-10-CM | POA: Diagnosis not present

## 2014-03-01 DIAGNOSIS — Z794 Long term (current) use of insulin: Secondary | ICD-10-CM | POA: Diagnosis not present

## 2014-03-01 DIAGNOSIS — R0602 Shortness of breath: Secondary | ICD-10-CM

## 2014-03-01 DIAGNOSIS — E1165 Type 2 diabetes mellitus with hyperglycemia: Secondary | ICD-10-CM | POA: Diagnosis present

## 2014-03-01 LAB — COMPREHENSIVE METABOLIC PANEL
ALBUMIN: 3.1 g/dL — AB (ref 3.5–5.2)
ALK PHOS: 114 U/L (ref 39–117)
ALT: 9 U/L (ref 0–35)
AST: 11 U/L (ref 0–37)
Anion gap: 11 (ref 5–15)
BILIRUBIN TOTAL: 0.3 mg/dL (ref 0.3–1.2)
BUN: 8 mg/dL (ref 6–23)
CHLORIDE: 99 meq/L (ref 96–112)
CO2: 30 meq/L (ref 19–32)
CREATININE: 0.93 mg/dL (ref 0.50–1.10)
Calcium: 9.5 mg/dL (ref 8.4–10.5)
GFR calc Af Amer: 69 mL/min — ABNORMAL LOW (ref >90)
GFR, EST NON AFRICAN AMERICAN: 60 mL/min — AB (ref >90)
Glucose, Bld: 169 mg/dL — ABNORMAL HIGH (ref 70–99)
POTASSIUM: 4 meq/L (ref 3.7–5.3)
Sodium: 140 mEq/L (ref 137–147)
Total Protein: 7.3 g/dL (ref 6.0–8.3)

## 2014-03-01 LAB — I-STAT TROPONIN, ED
Troponin i, poc: 0.02 ng/mL (ref 0.00–0.08)
Troponin i, poc: 0.01 ng/mL (ref 0.00–0.08)

## 2014-03-01 LAB — CBC
HEMATOCRIT: 42.8 % (ref 36.0–46.0)
Hemoglobin: 13.9 g/dL (ref 12.0–15.0)
MCH: 31.7 pg (ref 26.0–34.0)
MCHC: 32.5 g/dL (ref 30.0–36.0)
MCV: 97.7 fL (ref 78.0–100.0)
PLATELETS: 269 10*3/uL (ref 150–400)
RBC: 4.38 MIL/uL (ref 3.87–5.11)
RDW: 13.4 % (ref 11.5–15.5)
WBC: 7.3 10*3/uL (ref 4.0–10.5)

## 2014-03-01 LAB — PRO B NATRIURETIC PEPTIDE: Pro B Natriuretic peptide (BNP): 207 pg/mL — ABNORMAL HIGH (ref 0–125)

## 2014-03-01 MED ORDER — FAMOTIDINE IN NACL 20-0.9 MG/50ML-% IV SOLN
20.0000 mg | Freq: Once | INTRAVENOUS | Status: AC
  Administered 2014-03-01: 20 mg via INTRAVENOUS
  Filled 2014-03-01: qty 50

## 2014-03-01 MED ORDER — ONDANSETRON 4 MG PO TBDP: 4.0000 mg | ORAL_TABLET | Freq: Three times a day (TID) | ORAL | Status: DC | PRN

## 2014-03-01 MED ORDER — ONDANSETRON HCL 4 MG/2ML IJ SOLN
4.0000 mg | Freq: Once | INTRAMUSCULAR | Status: AC
  Administered 2014-03-01: 4 mg via INTRAVENOUS
  Filled 2014-03-01: qty 2

## 2014-03-01 MED ORDER — SODIUM CHLORIDE 0.9 % IV BOLUS (SEPSIS)
1000.0000 mL | Freq: Once | INTRAVENOUS | Status: AC
  Administered 2014-03-01: 1000 mL via INTRAVENOUS

## 2014-03-01 MED ORDER — IOHEXOL 300 MG/ML  SOLN
100.0000 mL | Freq: Once | INTRAMUSCULAR | Status: AC | PRN
  Administered 2014-03-01: 100 mL via INTRAVENOUS

## 2014-03-01 NOTE — Telephone Encounter (Signed)
Unable to reach pt. Will try again at a later time.  
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
## 2014-03-01 NOTE — Telephone Encounter (Signed)
Pt's daughter advised. Pt coming for appointment on 11/17 at 300 pm.

## 2014-03-01 NOTE — Discharge Instructions (Signed)
Abdominal Pain [name], you were seen today for abdominal pain. Your lab values and CT scan did not show a cause for this. Follow-up with your primary care physician for continued treatment. Take nausea medication at home as needed. If any of your symptoms worsen come back to emergency department and medially for repeat evaluation. Thank you. Many things can cause belly (abdominal) pain. Most times, the belly pain is not dangerous. Many cases of belly pain can be watched and treated at home. HOME CARE   Do not take medicines that help you go poop (laxatives) unless told to by your doctor.  Only take medicine as told by your doctor.  Eat or drink as told by your doctor. Your doctor will tell you if you should be on a special diet. GET HELP IF:  You do not know what is causing your belly pain.  You have belly pain while you are sick to your stomach (nauseous) or have runny poop (diarrhea).  You have pain while you pee or poop.  Your belly pain wakes you up at night.  You have belly pain that gets worse or better when you eat.  You have belly pain that gets worse when you eat fatty foods.  You have a fever. GET HELP RIGHT AWAY IF:   The pain does not go away within 2 hours.  You keep throwing up (vomiting).  The pain changes and is only in the right or left part of the belly.  You have bloody or tarry looking poop. MAKE SURE YOU:   Understand these instructions.  Will watch your condition.  Will get help right away if you are not doing well or get worse. Document Released: 09/22/2007 Document Revised: 04/10/2013 Document Reviewed: 12/13/2012 [facility] Patient Information 2015 [place], [place]. This information is not intended to replace advice given to you by your health care provider. Make sure you discuss any questions you have with your health care provider.

## 2014-03-01 NOTE — Telephone Encounter (Signed)
Contacted pt's daughter. She wanted to know if pt's rx for her pain medication could be refilled so Rx could be picked up on Monday. Pt was last seen on 11/28/2014 and med was last refilled on 02/01/2014.  Thanks!

## 2014-03-01 NOTE — ED Notes (Signed)
Pt arrived by [facility] for flu like symptoms x 2 days, having headache, bodyaches, n/v/d, fever. Reports cbg > 400 today. Ems reports cbg 183.

## 2014-03-01 NOTE — Telephone Encounter (Signed)
Ov is due.  We'll address then. 

## 2014-03-01 NOTE — Telephone Encounter (Signed)
Patient daughter asked is it time for her mom injection.Please advise

## 2014-03-01 NOTE — ED Notes (Signed)
Patient transported to X-ray 

## 2014-03-01 NOTE — ED Notes (Signed)
Pt urinated on self, unable to collect sample at this time.

## 2014-03-05 ENCOUNTER — Encounter: Payer: Self-pay | Admitting: Endocrinology

## 2014-03-05 ENCOUNTER — Ambulatory Visit (INDEPENDENT_AMBULATORY_CARE_PROVIDER_SITE_OTHER): Payer: Medicare PPO | Admitting: Endocrinology

## 2014-03-05 VITALS — BP 134/72 | HR 81 | Temp 98.4°F

## 2014-03-05 DIAGNOSIS — R079 Chest pain, unspecified: Secondary | ICD-10-CM

## 2014-03-05 MED ORDER — FUROSEMIDE 40 MG PO TABS: 40.0000 mg | ORAL_TABLET | Freq: Two times a day (BID) | ORAL | Status: DC

## 2014-03-05 MED ORDER — ISOSORBIDE MONONITRATE ER 120 MG PO TB24: 120.0000 mg | ORAL_TABLET | Freq: Every day | ORAL | Status: DC

## 2014-03-05 MED ORDER — OXYCODONE HCL 5 MG PO TABS: 5.0000 mg | ORAL_TABLET | ORAL | Status: DC | PRN

## 2014-03-05 NOTE — Patient Instructions (Addendum)
Please come back for a regular physical appointment in 3 months.  Please increase the furosemide and isosorbide.  i have sent prescriptions for both to your pharmacy.
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
## 2014-03-06 ENCOUNTER — Other Ambulatory Visit: Payer: Self-pay

## 2014-03-06 MED ORDER — ALIGN 4 MG PO CAPS: 1.0000 | ORAL_CAPSULE | Freq: Every day | ORAL | Status: DC

## 2014-03-18 ENCOUNTER — Other Ambulatory Visit: Payer: Self-pay | Admitting: Endocrinology

## 2014-03-18 ENCOUNTER — Ambulatory Visit: Payer: Medicare PPO | Admitting: Endocrinology

## 2014-03-25 ENCOUNTER — Telehealth: Payer: Self-pay | Admitting: Endocrinology

## 2014-03-25 ENCOUNTER — Other Ambulatory Visit: Payer: Self-pay | Admitting: Endocrinology

## 2014-03-25 NOTE — Telephone Encounter (Signed)
Patient daughter called stating that [name] is currently out of her Novolin    Pharmacy: [facility]

## 2014-03-25 NOTE — Telephone Encounter (Signed)
Pt's daughter advised that rx has been sent to pharmacy.

## 2014-04-05 ENCOUNTER — Ambulatory Visit (INDEPENDENT_AMBULATORY_CARE_PROVIDER_SITE_OTHER): Payer: Medicare PPO | Admitting: Neurology

## 2014-04-05 ENCOUNTER — Encounter: Payer: Self-pay | Admitting: Neurology

## 2014-04-05 ENCOUNTER — Telehealth: Payer: Self-pay | Admitting: Endocrinology

## 2014-04-05 VITALS — BP 138/86 | HR 87 | Resp 20 | Wt 358.0 lb

## 2014-04-05 DIAGNOSIS — R404 Transient alteration of awareness: Secondary | ICD-10-CM

## 2014-04-05 DIAGNOSIS — R413 Other amnesia: Secondary | ICD-10-CM

## 2014-04-05 MED ORDER — OXYCODONE HCL 5 MG PO TABS: 5.0000 mg | ORAL_TABLET | ORAL | Status: DC | PRN
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
## 2014-04-05 NOTE — Patient Instructions (Signed)
1. MRI brain without contrast 2. Routine EEG 3. Bloodwork for vitamin B12 4. Follow-up in 6 months

## 2014-04-05 NOTE — Telephone Encounter (Signed)
Pt is requesting refill on her Oxycodone. Pt was last seen and medication was last refilled on 03/05/2014.  Could sign in Dr. Cordelia Pen absence. Thanks!

## 2014-04-05 NOTE — Addendum Note (Signed)
Addended by: Moody Bruins E on: 04/05/2014 11:01 AM   Modules accepted: Orders

## 2014-04-05 NOTE — Telephone Encounter (Signed)
Pt needs rx for pain med please

## 2014-04-05 NOTE — Telephone Encounter (Signed)
Lvom advising pt's daughter rx is ready for pick up. Rx placed up front.

## 2014-04-05 NOTE — Telephone Encounter (Signed)
ok 

## 2014-04-06 LAB — VITAMIN B12: VITAMIN B 12: 432 pg/mL (ref 211–911)

## 2014-04-09 ENCOUNTER — Telehealth: Payer: Self-pay

## 2014-04-09 NOTE — Telephone Encounter (Signed)
Spoke with pt's daughter about flu shot. Pt is going to come by the office soon to have injection.

## 2014-04-10 ENCOUNTER — Encounter: Payer: Self-pay | Admitting: Neurology

## 2014-04-22 ENCOUNTER — Ambulatory Visit (INDEPENDENT_AMBULATORY_CARE_PROVIDER_SITE_OTHER): Payer: Medicare PPO | Admitting: Neurology

## 2014-04-22 DIAGNOSIS — R404 Transient alteration of awareness: Secondary | ICD-10-CM | POA: Diagnosis not present

## 2014-04-22 DIAGNOSIS — R413 Other amnesia: Secondary | ICD-10-CM

## 2014-04-24 ENCOUNTER — Other Ambulatory Visit: Payer: Self-pay | Admitting: Endocrinology

## 2014-04-24 NOTE — Procedures (Signed)
ELECTROENCEPHALOGRAM REPORT  Date of Study: 04/22/2014  Patient's Name: [name] MRN: [id number] Date of Birth: 03-16-1941  Referring Provider: Dr. Ellouise Newer  Clinical History: This is a 74 year old woman with memory loss with hallucinations and diagnosis of "silent seizures."   Medications: Keppra, Wellbutrin  Technical Summary: A multichannel digital EEG recording measured by the international 10-20 system with electrodes applied with paste and impedances below 5000 ohms performed in our laboratory with EKG monitoring in an awake and asleep patient.  Hyperventilation was not performed. Photic stimulation was performed.  The digital EEG was referentially recorded, reformatted, and digitally filtered in a variety of bipolar and referential montages for optimal display.    Description: The patient is awake and asleep during the recording.  During maximal wakefulness, there is a symmetric, medium voltage 8-9 Hz posterior dominant rhythm that attenuates with eye opening.  The record is symmetric. EKG artifact is seen throughout the recording.  During drowsiness and sleep, there is an increase in theta slowing of the background.  Vertex waves and symmetric sleep spindles were seen.  Photic stimulation did not elicit any abnormalities.  There were no epileptiform discharges or electrographic seizures seen.    EKG lead was unremarkable.  Impression: This awake and asleep EEG is normal.    Clinical Correlation: A normal EEG does not exclude a clinical diagnosis of epilepsy.  If further clinical questions remain, prolonged EEG may be helpful.  Clinical correlation is advised.   Ellouise Newer, M.D.

## 2014-04-26 ENCOUNTER — Ambulatory Visit (HOSPITAL_COMMUNITY): Admission: RE | Admit: 2014-04-26 | Payer: Medicare PPO | Source: Ambulatory Visit

## 2014-05-06 DIAGNOSIS — E109 Type 1 diabetes mellitus without complications: Secondary | ICD-10-CM | POA: Diagnosis not present

## 2014-05-07 ENCOUNTER — Telehealth: Payer: Self-pay

## 2014-05-07 MED ORDER — OXYCODONE HCL 5 MG PO TABS: 5.0000 mg | ORAL_TABLET | ORAL | Status: DC | PRN

## 2014-05-07 NOTE — Telephone Encounter (Signed)
i printed 

## 2014-05-07 NOTE — Telephone Encounter (Signed)
Pt is requesting a refill on her Oxycodone. Medication was last refilled 04/05/2014 and pt was last seen on 03/05/2014.  Thanks!

## 2014-05-07 NOTE — Telephone Encounter (Signed)
Pt's daughter advised that rx is ready for pick up. rx placed upfront.

## 2014-05-22 ENCOUNTER — Encounter: Payer: Self-pay | Admitting: Endocrinology

## 2014-05-22 ENCOUNTER — Ambulatory Visit (INDEPENDENT_AMBULATORY_CARE_PROVIDER_SITE_OTHER): Payer: Medicare PPO | Admitting: Endocrinology

## 2014-05-22 VITALS — BP 128/88 | HR 82 | Temp 99.0°F

## 2014-05-22 DIAGNOSIS — E876 Hypokalemia: Secondary | ICD-10-CM | POA: Diagnosis not present

## 2014-05-22 DIAGNOSIS — R0602 Shortness of breath: Secondary | ICD-10-CM

## 2014-05-22 DIAGNOSIS — I5032 Chronic diastolic (congestive) heart failure: Secondary | ICD-10-CM | POA: Diagnosis not present

## 2014-05-22 DIAGNOSIS — IMO0002 Reserved for concepts with insufficient information to code with codable children: Secondary | ICD-10-CM

## 2014-05-22 DIAGNOSIS — E1065 Type 1 diabetes mellitus with hyperglycemia: Principal | ICD-10-CM

## 2014-05-22 DIAGNOSIS — E1059 Type 1 diabetes mellitus with other circulatory complications: Secondary | ICD-10-CM | POA: Diagnosis not present

## 2014-05-22 DIAGNOSIS — Z79891 Long term (current) use of opiate analgesic: Secondary | ICD-10-CM | POA: Diagnosis not present

## 2014-05-22 DIAGNOSIS — E1051 Type 1 diabetes mellitus with diabetic peripheral angiopathy without gangrene: Secondary | ICD-10-CM

## 2014-05-22 DIAGNOSIS — Z79899 Other long term (current) drug therapy: Secondary | ICD-10-CM | POA: Diagnosis not present

## 2014-05-22 MED ORDER — INSULIN LISPRO PROT & LISPRO (75-25 MIX) 100 UNIT/ML KWIKPEN: 300.0000 [IU] | PEN_INJECTOR | SUBCUTANEOUS | Status: DC
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
## 2014-05-22 NOTE — Patient Instructions (Addendum)
Please come back for a regular physical appointment in 2 weeks.  Please bring all of your med bottles, so we can review.   i have sent a prescription to your pharmacy, to change to "75/25" insulin.  We are starting at just 300 units, for safety.  However, this is probably not enough, so please call next week, to tell us how it is doing.   check your blood sugar twice a day.  vary the time of day when you check, between before the 3 meals, and at bedtime.  also check if you have symptoms of your blood sugar being too high or too low.  please keep a record of the readings and bring it to your next appointment here.  You can write it on any piece of paper.  please call us sooner if your blood sugar goes below 70, or if you have a lot of readings over 200.   blood tests are being requested for you today.  We'll let you know about the results.

## 2014-05-23 LAB — LDL CHOLESTEROL, DIRECT: LDL DIRECT: 75 mg/dL

## 2014-05-23 LAB — LIPID PANEL
CHOL/HDL RATIO: 5
CHOLESTEROL: 145 mg/dL (ref 0–200)
HDL: 29.6 mg/dL — AB (ref >39.00)
NonHDL: 115.4
TRIGLYCERIDES: 308 mg/dL — AB (ref 0.0–149.0)
VLDL: 61.6 mg/dL — ABNORMAL HIGH (ref 0.0–40.0)

## 2014-05-23 LAB — BASIC METABOLIC PANEL
BUN: 15 mg/dL (ref 6–23)
CALCIUM: 8.9 mg/dL (ref 8.4–10.5)
CO2: 30 mEq/L (ref 19–32)
Chloride: 99 mEq/L (ref 96–112)
Creatinine, Ser: 1.08 mg/dL (ref 0.40–1.20)
GFR: 64.04 mL/min (ref >60.00)
Glucose, Bld: 258 mg/dL — ABNORMAL HIGH (ref 70–99)
Potassium: 3.6 mEq/L (ref 3.5–5.1)
SODIUM: 135 meq/L (ref 135–145)

## 2014-05-23 LAB — MICROALBUMIN / CREATININE URINE RATIO
Creatinine,U: 126.9 mg/dL
Microalb Creat Ratio: 0.9 mg/g (ref 0.0–30.0)
Microalb, Ur: 1.1 mg/dL (ref 0.0–1.9)

## 2014-05-23 LAB — HEMOGLOBIN A1C: HEMOGLOBIN A1C: 10.8 % — AB (ref 4.6–6.5)

## 2014-05-23 LAB — BRAIN NATRIURETIC PEPTIDE: Pro B Natriuretic peptide (BNP): 51 pg/mL (ref 0.0–100.0)

## 2014-05-24 DIAGNOSIS — E1140 Type 2 diabetes mellitus with diabetic neuropathy, unspecified: Secondary | ICD-10-CM | POA: Diagnosis not present

## 2014-05-24 DIAGNOSIS — L602 Onychogryphosis: Secondary | ICD-10-CM | POA: Diagnosis not present

## 2014-05-24 DIAGNOSIS — L84 Corns and callosities: Secondary | ICD-10-CM | POA: Diagnosis not present

## 2014-06-05 ENCOUNTER — Ambulatory Visit: Payer: Medicare PPO | Admitting: Endocrinology

## 2014-06-10 ENCOUNTER — Telehealth: Payer: Self-pay | Admitting: Endocrinology

## 2014-06-10 MED ORDER — OXYCODONE HCL 5 MG PO TABS: 5.0000 mg | ORAL_TABLET | ORAL | Status: DC | PRN

## 2014-06-10 NOTE — Telephone Encounter (Signed)
Pt's daughter advised rx is ready for pick up. Rx placed upfront.

## 2014-06-10 NOTE — Telephone Encounter (Signed)
i printed 

## 2014-06-10 NOTE — Telephone Encounter (Signed)
See below. Rx was last refilled on 05/07/2014 and pt was last seen on 05/22/2014.  Thanks!

## 2014-06-10 NOTE — Telephone Encounter (Signed)
Pt wants refill on hr oxycondone

## 2014-06-14 ENCOUNTER — Telehealth: Payer: Self-pay | Admitting: Endocrinology

## 2014-06-14 DIAGNOSIS — IMO0002 Reserved for concepts with insufficient information to code with codable children: Secondary | ICD-10-CM

## 2014-06-14 DIAGNOSIS — E1065 Type 1 diabetes mellitus with hyperglycemia: Principal | ICD-10-CM

## 2014-06-14 DIAGNOSIS — E876 Hypokalemia: Secondary | ICD-10-CM

## 2014-06-14 DIAGNOSIS — R0602 Shortness of breath: Secondary | ICD-10-CM

## 2014-06-14 DIAGNOSIS — I5032 Chronic diastolic (congestive) heart failure: Secondary | ICD-10-CM

## 2014-06-14 DIAGNOSIS — E1051 Type 1 diabetes mellitus with diabetic peripheral angiopathy without gangrene: Secondary | ICD-10-CM

## 2014-06-14 MED ORDER — INSULIN LISPRO PROT & LISPRO (75-25 MIX) 100 UNIT/ML KWIKPEN: 300.0000 [IU] | PEN_INJECTOR | SUBCUTANEOUS | Status: DC

## 2014-06-14 NOTE — Telephone Encounter (Signed)
Pharmacy stating that they do not have the humalog insulin rx

## 2014-06-14 NOTE — Telephone Encounter (Signed)
Rx sent 

## 2014-06-14 NOTE — Telephone Encounter (Signed)
Pt called again to let us know she want humalog pens

## 2014-06-18 ENCOUNTER — Emergency Department (HOSPITAL_COMMUNITY): Payer: Medicare PPO

## 2014-06-18 ENCOUNTER — Emergency Department (HOSPITAL_COMMUNITY)
Admission: EM | Admit: 2014-06-18 | Discharge: 2014-06-18 | Disposition: A | Discharge status: Home / Self Care | Payer: Medicare PPO | Attending: Emergency Medicine | Admitting: Emergency Medicine

## 2014-06-18 ENCOUNTER — Encounter (HOSPITAL_COMMUNITY): Payer: Self-pay | Admitting: Emergency Medicine

## 2014-06-18 DIAGNOSIS — K219 Gastro-esophageal reflux disease without esophagitis: Secondary | ICD-10-CM | POA: Insufficient documentation

## 2014-06-18 DIAGNOSIS — I252 Old myocardial infarction: Secondary | ICD-10-CM | POA: Diagnosis not present

## 2014-06-18 DIAGNOSIS — I509 Heart failure, unspecified: Secondary | ICD-10-CM | POA: Insufficient documentation

## 2014-06-18 DIAGNOSIS — Z794 Long term (current) use of insulin: Secondary | ICD-10-CM | POA: Diagnosis not present

## 2014-06-18 DIAGNOSIS — R05 Cough: Secondary | ICD-10-CM | POA: Diagnosis not present

## 2014-06-18 DIAGNOSIS — R1084 Generalized abdominal pain: Secondary | ICD-10-CM | POA: Insufficient documentation

## 2014-06-18 DIAGNOSIS — R109 Unspecified abdominal pain: Secondary | ICD-10-CM

## 2014-06-18 DIAGNOSIS — Z9089 Acquired absence of other organs: Secondary | ICD-10-CM | POA: Insufficient documentation

## 2014-06-18 DIAGNOSIS — R630 Anorexia: Secondary | ICD-10-CM | POA: Diagnosis not present

## 2014-06-18 DIAGNOSIS — R0602 Shortness of breath: Secondary | ICD-10-CM | POA: Diagnosis not present

## 2014-06-18 DIAGNOSIS — I10 Essential (primary) hypertension: Secondary | ICD-10-CM | POA: Insufficient documentation

## 2014-06-18 DIAGNOSIS — G4733 Obstructive sleep apnea (adult) (pediatric): Secondary | ICD-10-CM | POA: Diagnosis not present

## 2014-06-18 DIAGNOSIS — K2970 Gastritis, unspecified, without bleeding: Secondary | ICD-10-CM | POA: Diagnosis not present

## 2014-06-18 DIAGNOSIS — E780 Pure hypercholesterolemia: Secondary | ICD-10-CM | POA: Insufficient documentation

## 2014-06-18 DIAGNOSIS — R079 Chest pain, unspecified: Secondary | ICD-10-CM | POA: Diagnosis not present

## 2014-06-18 DIAGNOSIS — E119 Type 2 diabetes mellitus without complications: Secondary | ICD-10-CM | POA: Diagnosis not present

## 2014-06-18 DIAGNOSIS — M159 Polyosteoarthritis, unspecified: Secondary | ICD-10-CM | POA: Insufficient documentation

## 2014-06-18 DIAGNOSIS — Z7982 Long term (current) use of aspirin: Secondary | ICD-10-CM | POA: Diagnosis not present

## 2014-06-18 DIAGNOSIS — Z7952 Long term (current) use of systemic steroids: Secondary | ICD-10-CM | POA: Insufficient documentation

## 2014-06-18 DIAGNOSIS — Z86010 Personal history of colonic polyps: Secondary | ICD-10-CM | POA: Insufficient documentation

## 2014-06-18 DIAGNOSIS — K8020 Calculus of gallbladder without cholecystitis without obstruction: Secondary | ICD-10-CM | POA: Diagnosis not present

## 2014-06-18 DIAGNOSIS — G43909 Migraine, unspecified, not intractable, without status migrainosus: Secondary | ICD-10-CM | POA: Insufficient documentation

## 2014-06-18 DIAGNOSIS — J449 Chronic obstructive pulmonary disease, unspecified: Secondary | ICD-10-CM | POA: Insufficient documentation

## 2014-06-18 DIAGNOSIS — E669 Obesity, unspecified: Secondary | ICD-10-CM | POA: Insufficient documentation

## 2014-06-18 DIAGNOSIS — Z7951 Long term (current) use of inhaled steroids: Secondary | ICD-10-CM | POA: Diagnosis not present

## 2014-06-18 DIAGNOSIS — M793 Panniculitis, unspecified: Secondary | ICD-10-CM | POA: Diagnosis not present

## 2014-06-18 DIAGNOSIS — Z90710 Acquired absence of both cervix and uterus: Secondary | ICD-10-CM | POA: Insufficient documentation

## 2014-06-18 DIAGNOSIS — Z9981 Dependence on supplemental oxygen: Secondary | ICD-10-CM | POA: Diagnosis not present

## 2014-06-18 DIAGNOSIS — J986 Disorders of diaphragm: Secondary | ICD-10-CM | POA: Diagnosis not present

## 2014-06-18 DIAGNOSIS — G40909 Epilepsy, unspecified, not intractable, without status epilepticus: Secondary | ICD-10-CM | POA: Diagnosis not present

## 2014-06-18 DIAGNOSIS — Z720 Tobacco use: Secondary | ICD-10-CM | POA: Insufficient documentation

## 2014-06-18 DIAGNOSIS — Z8673 Personal history of transient ischemic attack (TIA), and cerebral infarction without residual deficits: Secondary | ICD-10-CM | POA: Diagnosis not present

## 2014-06-18 DIAGNOSIS — R197 Diarrhea, unspecified: Secondary | ICD-10-CM | POA: Diagnosis not present

## 2014-06-18 DIAGNOSIS — R112 Nausea with vomiting, unspecified: Secondary | ICD-10-CM | POA: Insufficient documentation

## 2014-06-18 DIAGNOSIS — G8929 Other chronic pain: Secondary | ICD-10-CM | POA: Diagnosis not present

## 2014-06-18 DIAGNOSIS — R300 Dysuria: Secondary | ICD-10-CM | POA: Diagnosis not present

## 2014-06-18 LAB — COMPREHENSIVE METABOLIC PANEL
ALBUMIN: 3.2 g/dL — AB (ref 3.5–5.2)
ALK PHOS: 84 U/L (ref 39–117)
ALT: 13 U/L (ref 0–35)
ANION GAP: 9 (ref 5–15)
AST: 18 U/L (ref 0–37)
BUN: 15 mg/dL (ref 6–23)
CHLORIDE: 100 mmol/L (ref 96–112)
CO2: 26 mmol/L (ref 19–32)
Calcium: 8.3 mg/dL — ABNORMAL LOW (ref 8.4–10.5)
Creatinine, Ser: 0.93 mg/dL (ref 0.50–1.10)
GFR calc Af Amer: 69 mL/min — ABNORMAL LOW (ref >90)
GFR calc non Af Amer: 60 mL/min — ABNORMAL LOW (ref >90)
Glucose, Bld: 260 mg/dL — ABNORMAL HIGH (ref 70–99)
Potassium: 3.5 mmol/L (ref 3.5–5.1)
SODIUM: 135 mmol/L (ref 135–145)
Total Bilirubin: 0.8 mg/dL (ref 0.3–1.2)
Total Protein: 7 g/dL (ref 6.0–8.3)

## 2014-06-18 LAB — URINALYSIS, ROUTINE W REFLEX MICROSCOPIC
GLUCOSE, UA: NEGATIVE mg/dL
Hgb urine dipstick: NEGATIVE
KETONES UR: NEGATIVE mg/dL
Leukocytes, UA: NEGATIVE
Nitrite: NEGATIVE
Protein, ur: NEGATIVE mg/dL
Specific Gravity, Urine: 1.022 (ref 1.005–1.030)
Urobilinogen, UA: 2 mg/dL — ABNORMAL HIGH (ref 0.0–1.0)
pH: 5.5 (ref 5.0–8.0)

## 2014-06-18 LAB — I-STAT TROPONIN, ED: Troponin i, poc: 0 ng/mL (ref 0.00–0.08)

## 2014-06-18 LAB — LIPASE, BLOOD: LIPASE: 20 U/L (ref 11–59)

## 2014-06-18 LAB — BRAIN NATRIURETIC PEPTIDE: B NATRIURETIC PEPTIDE 5: 95.2 pg/mL (ref 0.0–100.0)

## 2014-06-18 LAB — CBG MONITORING, ED: Glucose-Capillary: 257 mg/dL — ABNORMAL HIGH (ref 70–99)

## 2014-06-18 MED ORDER — IOHEXOL 300 MG/ML  SOLN
100.0000 mL | Freq: Once | INTRAMUSCULAR | Status: AC | PRN
  Administered 2014-06-18: 100 mL via INTRAVENOUS

## 2014-06-18 MED ORDER — ONDANSETRON 4 MG PO TBDP: 4.0000 mg | ORAL_TABLET | Freq: Three times a day (TID) | ORAL | Status: DC | PRN

## 2014-06-18 MED ORDER — ONDANSETRON HCL 4 MG/2ML IJ SOLN
4.0000 mg | Freq: Once | INTRAMUSCULAR | Status: AC
  Administered 2014-06-18: 4 mg via INTRAVENOUS
  Filled 2014-06-18: qty 2

## 2014-06-18 MED ORDER — SODIUM CHLORIDE 0.9 % IV BOLUS (SEPSIS)
1000.0000 mL | Freq: Once | INTRAVENOUS | Status: AC
  Administered 2014-06-18: 1000 mL via INTRAVENOUS

## 2014-06-18 MED ORDER — HYDROMORPHONE HCL 1 MG/ML IJ SOLN
1.0000 mg | Freq: Once | INTRAMUSCULAR | Status: AC
  Administered 2014-06-18: 1 mg via INTRAVENOUS
  Filled 2014-06-18: qty 1

## 2014-06-18 NOTE — Discharge Instructions (Signed)

## 2014-06-18 NOTE — ED Notes (Signed)
Per EMS-dysuria for 2 days, odor, urinary retention-symptoms worsened yesterday

## 2014-06-18 NOTE — ED Notes (Signed)
MD at bedside. 
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
## 2014-06-19 ENCOUNTER — Telehealth: Payer: Self-pay | Admitting: Endocrinology

## 2014-06-19 LAB — CBC WITH DIFFERENTIAL/PLATELET
HEMATOCRIT: 42.9 % (ref 36.0–46.0)
HEMOGLOBIN: 14.3 g/dL (ref 12.0–15.0)
MCH: 32.6 pg (ref 26.0–34.0)
MCHC: 33.3 g/dL (ref 30.0–36.0)
MCV: 97.9 fL (ref 78.0–100.0)
Platelets: 209 10*3/uL (ref 150–400)
RBC: 4.38 MIL/uL (ref 3.87–5.11)
RDW: 12.5 % (ref 11.5–15.5)
WBC: 24.2 10*3/uL — AB (ref 4.0–10.5)

## 2014-06-19 LAB — URINE CULTURE
COLONY COUNT: NO GROWTH
CULTURE: NO GROWTH

## 2014-06-19 NOTE — Telephone Encounter (Signed)
Contacted pt's daughter and advised of note below. Daughter voiced understanding.

## 2014-06-19 NOTE — Telephone Encounter (Signed)
Please call priscilla # [phone] in hospital yesterday. She is not producing a lot of urine.

## 2014-06-19 NOTE — Telephone Encounter (Signed)
I called and spoke with pt's daughter this morning. For the past two days the pt has only been able to urinate 1 time per day at 6am. Pt's daughter has been measuring the urine and stated each morning the quantity  has been 200 ML. Yesterday pt was taken to the ED for nausea and vomiting. Daughter was instructed to call our office about the issue she has been having with urination. Please advise, Thanks!

## 2014-06-19 NOTE — Telephone Encounter (Signed)
Please measure the total 24-HR urine amount. Please come in soon for cpx.

## 2014-07-02 ENCOUNTER — Encounter: Payer: Medicare PPO | Admitting: Endocrinology

## 2014-07-05 ENCOUNTER — Encounter: Payer: Self-pay | Admitting: Endocrinology

## 2014-07-05 ENCOUNTER — Ambulatory Visit (INDEPENDENT_AMBULATORY_CARE_PROVIDER_SITE_OTHER): Payer: Medicare PPO | Admitting: Endocrinology

## 2014-07-05 VITALS — BP 132/80 | HR 89 | Temp 98.0°F | Ht 71.0 in | Wt 274.0 lb

## 2014-07-05 DIAGNOSIS — Z0000 Encounter for general adult medical examination without abnormal findings: Secondary | ICD-10-CM | POA: Diagnosis not present

## 2014-07-05 DIAGNOSIS — E1059 Type 1 diabetes mellitus with other circulatory complications: Secondary | ICD-10-CM

## 2014-07-05 DIAGNOSIS — Z0189 Encounter for other specified special examinations: Secondary | ICD-10-CM

## 2014-07-05 DIAGNOSIS — Z23 Encounter for immunization: Secondary | ICD-10-CM | POA: Diagnosis not present

## 2014-07-05 DIAGNOSIS — N289 Disorder of kidney and ureter, unspecified: Secondary | ICD-10-CM | POA: Diagnosis not present

## 2014-07-05 DIAGNOSIS — E1065 Type 1 diabetes mellitus with hyperglycemia: Principal | ICD-10-CM

## 2014-07-05 DIAGNOSIS — M810 Age-related osteoporosis without current pathological fracture: Secondary | ICD-10-CM

## 2014-07-05 DIAGNOSIS — E1051 Type 1 diabetes mellitus with diabetic peripheral angiopathy without gangrene: Secondary | ICD-10-CM

## 2014-07-05 DIAGNOSIS — IMO0002 Reserved for concepts with insufficient information to code with codable children: Secondary | ICD-10-CM

## 2014-07-05 LAB — CBC WITH DIFFERENTIAL/PLATELET
Basophils Absolute: 0 10*3/uL (ref 0.0–0.1)
Basophils Relative: 0 % (ref 0–1)
EOS ABS: 0.1 10*3/uL (ref 0.0–0.7)
EOS PCT: 1 % (ref 0–5)
HCT: 38.9 % (ref 36.0–46.0)
HEMOGLOBIN: 12.7 g/dL (ref 12.0–15.0)
Lymphocytes Relative: 33 % (ref 12–46)
Lymphs Abs: 2.5 10*3/uL (ref 0.7–4.0)
MCH: 31.9 pg (ref 26.0–34.0)
MCHC: 32.6 g/dL (ref 30.0–36.0)
MCV: 97.7 fL (ref 78.0–100.0)
MONO ABS: 0.6 10*3/uL (ref 0.1–1.0)
MPV: 11.3 fL (ref 8.6–12.4)
Monocytes Relative: 8 % (ref 3–12)
Neutro Abs: 4.4 10*3/uL (ref 1.7–7.7)
Neutrophils Relative %: 58 % (ref 43–77)
Platelets: 303 10*3/uL (ref 150–400)
RBC: 3.98 MIL/uL (ref 3.87–5.11)
RDW: 13.4 % (ref 11.5–15.5)
WBC: 7.6 10*3/uL (ref 4.0–10.5)

## 2014-07-05 NOTE — Patient Instructions (Addendum)
Please come back for a follow-up appointment in 3 months   check your blood sugar twice a day.  vary the time of day when you check, between before the 3 meals, and at bedtime.  also check if you have symptoms of your blood sugar being too high or too low.  please keep a record of the readings and bring it to your next appointment here.  You can write it on any piece of paper.  please call us sooner if your blood sugar goes below 70, or if you have a lot of readings over 200.   blood tests are being requested for you today.  We'll let you know about the results. please consider these measures for your health:  minimize alcohol.  do not use tobacco products.  have a colonoscopy at least every 10 years from age 4.  Women should have an annual mammogram from age 14.  keep firearms safely stored.  always use seat belts.  have working smoke alarms in your home.  see an eye doctor and dentist regularly.  never drive under the influence of alcohol or drugs (including prescription drugs).   it is critically important to prevent falling down (keep floor areas well-lit, dry, and free of loose objects.  If you have a cane, walker, or wheelchair, you should use it, even for short trips around the house.  Also, try not to rush).
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
## 2014-07-05 NOTE — Progress Notes (Signed)
we discussed code status.  pt requests full code, but would not want to be started or maintained on artificial life-support measures if there was not a reasonable chance of recovery 

## 2014-07-06 LAB — URINALYSIS, ROUTINE W REFLEX MICROSCOPIC
BILIRUBIN URINE: NEGATIVE
Glucose, UA: NEGATIVE mg/dL
Hgb urine dipstick: NEGATIVE
Ketones, ur: NEGATIVE mg/dL
Leukocytes, UA: NEGATIVE
Nitrite: NEGATIVE
PH: 6.5 (ref 5.0–8.0)
Protein, ur: NEGATIVE mg/dL
SPECIFIC GRAVITY, URINE: 1.006 (ref 1.005–1.030)
UROBILINOGEN UA: 1 mg/dL (ref 0.0–1.0)

## 2014-07-06 LAB — MICROALBUMIN / CREATININE URINE RATIO
CREATININE, URINE: 69.5 mg/dL
MICROALB UR: 0.5 mg/dL (ref <2.0)
MICROALB/CREAT RATIO: 7.2 mg/g (ref 0.0–30.0)

## 2014-07-08 ENCOUNTER — Telehealth: Payer: Self-pay | Admitting: Endocrinology

## 2014-07-08 LAB — LIPID PANEL
CHOL/HDL RATIO: 5
Cholesterol: 131 mg/dL (ref 0–200)
HDL: 27.3 mg/dL — AB (ref >39.00)
LDL Cholesterol: 72 mg/dL (ref 0–99)
NONHDL: 103.7
TRIGLYCERIDES: 160 mg/dL — AB (ref 0.0–149.0)
VLDL: 32 mg/dL (ref 0.0–40.0)

## 2014-07-08 LAB — HEPATIC FUNCTION PANEL
ALK PHOS: 70 U/L (ref 39–117)
ALT: 8 U/L (ref 0–35)
AST: 18 U/L (ref 0–37)
Albumin: 3.1 g/dL — ABNORMAL LOW (ref 3.5–5.2)
Bilirubin, Direct: 0 mg/dL (ref 0.0–0.3)
TOTAL PROTEIN: 7.1 g/dL (ref 6.0–8.3)
Total Bilirubin: 0.3 mg/dL (ref 0.2–1.2)

## 2014-07-08 LAB — BASIC METABOLIC PANEL
BUN: 15 mg/dL (ref 6–23)
CO2: 27 meq/L (ref 19–32)
CREATININE: 1.18 mg/dL (ref 0.40–1.20)
Calcium: 9.1 mg/dL (ref 8.4–10.5)
Chloride: 98 mEq/L (ref 96–112)
GFR: 57.8 mL/min — ABNORMAL LOW (ref >60.00)
GLUCOSE: 163 mg/dL — AB (ref 70–99)
Potassium: 3.7 mEq/L (ref 3.5–5.1)
Sodium: 135 mEq/L (ref 135–145)

## 2014-07-08 LAB — PTH, INTACT AND CALCIUM
Calcium: 8.9 mg/dL (ref 8.4–10.5)
PTH: 92 pg/mL — ABNORMAL HIGH (ref 14–64)

## 2014-07-08 LAB — TSH: TSH: 0.76 u[IU]/mL (ref 0.35–4.50)

## 2014-07-08 LAB — HEMOGLOBIN A1C: Hgb A1c MFr Bld: 10.3 % — ABNORMAL HIGH (ref 4.6–6.5)

## 2014-07-08 MED ORDER — OXYCODONE HCL 5 MG PO TABS: 5.0000 mg | ORAL_TABLET | ORAL | Status: DC | PRN

## 2014-07-08 NOTE — Telephone Encounter (Signed)
i printed 

## 2014-07-08 NOTE — Telephone Encounter (Signed)
See note below; thanks ?

## 2014-07-08 NOTE — Telephone Encounter (Signed)
Pt needs refill on oxycodone.  °

## 2014-07-08 NOTE — Telephone Encounter (Signed)
Pt's daughter advised that rx is ready for pick up. Rx placed upfront.

## 2014-07-09 ENCOUNTER — Other Ambulatory Visit: Payer: Self-pay | Admitting: Endocrinology

## 2014-07-09 DIAGNOSIS — E1051 Type 1 diabetes mellitus with diabetic peripheral angiopathy without gangrene: Secondary | ICD-10-CM

## 2014-07-09 DIAGNOSIS — I5032 Chronic diastolic (congestive) heart failure: Secondary | ICD-10-CM

## 2014-07-09 DIAGNOSIS — E1065 Type 1 diabetes mellitus with hyperglycemia: Principal | ICD-10-CM

## 2014-07-09 DIAGNOSIS — IMO0002 Reserved for concepts with insufficient information to code with codable children: Secondary | ICD-10-CM

## 2014-07-09 DIAGNOSIS — R0602 Shortness of breath: Secondary | ICD-10-CM

## 2014-07-09 DIAGNOSIS — E876 Hypokalemia: Secondary | ICD-10-CM

## 2014-07-09 MED ORDER — INSULIN LISPRO PROT & LISPRO (75-25 MIX) 100 UNIT/ML KWIKPEN: 350.0000 [IU] | PEN_INJECTOR | SUBCUTANEOUS | Status: DC

## 2014-07-16 ENCOUNTER — Other Ambulatory Visit: Payer: Self-pay | Admitting: Endocrinology

## 2014-07-20 DIAGNOSIS — E109 Type 1 diabetes mellitus without complications: Secondary | ICD-10-CM | POA: Diagnosis not present

## 2014-07-30 ENCOUNTER — Telehealth: Payer: Self-pay | Admitting: Endocrinology

## 2014-07-30 MED ORDER — OMEPRAZOLE 40 MG PO CPDR: 40.0000 mg | DELAYED_RELEASE_CAPSULE | Freq: Every day | ORAL | Status: DC

## 2014-07-30 NOTE — Telephone Encounter (Signed)
Pt needs refill on prilosec

## 2014-07-30 NOTE — Telephone Encounter (Signed)
Rx sent per pt's request.  

## 2014-08-02 DIAGNOSIS — E109 Type 1 diabetes mellitus without complications: Secondary | ICD-10-CM | POA: Diagnosis not present

## 2014-08-08 ENCOUNTER — Telehealth: Payer: Self-pay | Admitting: Endocrinology

## 2014-08-08 MED ORDER — OXYCODONE HCL 5 MG PO TABS: 5.0000 mg | ORAL_TABLET | ORAL | Status: DC | PRN

## 2014-08-08 NOTE — Telephone Encounter (Signed)
See note below. Last rx was refilled on 07/18/2014. Thanks!

## 2014-08-08 NOTE — Telephone Encounter (Signed)
Patient would like a refill on her pain medication   Please advise    Thank you

## 2014-08-08 NOTE — Telephone Encounter (Signed)
i printed 

## 2014-08-08 NOTE — Telephone Encounter (Signed)
Pt's daughter advised. Rx Placed upfront.

## 2014-08-16 ENCOUNTER — Encounter: Payer: Self-pay | Admitting: Endocrinology

## 2014-08-22 DIAGNOSIS — Z0279 Encounter for issue of other medical certificate: Secondary | ICD-10-CM

## 2014-08-22 DIAGNOSIS — L602 Onychogryphosis: Secondary | ICD-10-CM | POA: Diagnosis not present

## 2014-08-22 DIAGNOSIS — L84 Corns and callosities: Secondary | ICD-10-CM | POA: Diagnosis not present

## 2014-08-22 DIAGNOSIS — E1151 Type 2 diabetes mellitus with diabetic peripheral angiopathy without gangrene: Secondary | ICD-10-CM | POA: Diagnosis not present

## 2014-08-29 ENCOUNTER — Other Ambulatory Visit: Payer: Self-pay | Admitting: Endocrinology

## 2014-09-09 ENCOUNTER — Telehealth: Payer: Self-pay | Admitting: Endocrinology

## 2014-09-09 MED ORDER — OXYCODONE HCL 5 MG PO TABS: 5.0000 mg | ORAL_TABLET | ORAL | Status: DC | PRN

## 2014-09-09 NOTE — Telephone Encounter (Signed)
Pt needs refill oxycodone.  °

## 2014-09-09 NOTE — Telephone Encounter (Signed)
See below. Rx was last refilled on 08/08/2014.

## 2014-09-09 NOTE — Telephone Encounter (Signed)
Patient's daughter contacted and advised Rx for Oxycodone is ready for pick up. Rx placed up front.

## 2014-09-09 NOTE — Telephone Encounter (Signed)
i printed 

## 2014-09-17 ENCOUNTER — Other Ambulatory Visit: Payer: Self-pay | Admitting: Endocrinology

## 2014-09-20 ENCOUNTER — Telehealth: Payer: Self-pay | Admitting: Endocrinology

## 2014-09-20 MED ORDER — DSS 100 MG PO CAPS: 100.0000 mg | ORAL_CAPSULE | Freq: Two times a day (BID) | ORAL | Status: DC

## 2014-09-20 NOTE — Telephone Encounter (Signed)
Patient ask if Dr [name] would send a stool softner to [facility] on [address]

## 2014-09-20 NOTE — Telephone Encounter (Signed)
See note below and please advise, Thanks! 

## 2014-09-20 NOTE — Addendum Note (Signed)
Addended by: Renato Shin on: 09/20/2014 07:06 PM   Modules accepted: Orders

## 2014-09-20 NOTE — Telephone Encounter (Signed)
i have sent a prescription to your pharmacy 

## 2014-09-22 ENCOUNTER — Other Ambulatory Visit: Payer: Self-pay | Admitting: Endocrinology

## 2014-09-23 NOTE — Telephone Encounter (Signed)
Pt's daughter advised Rx has been sent.

## 2014-09-24 ENCOUNTER — Other Ambulatory Visit: Payer: Self-pay | Admitting: Endocrinology

## 2014-10-07 ENCOUNTER — Ambulatory Visit: Payer: Medicare PPO | Admitting: Neurology

## 2014-10-11 ENCOUNTER — Other Ambulatory Visit: Payer: Self-pay | Admitting: *Deleted

## 2014-10-11 ENCOUNTER — Telehealth: Payer: Self-pay | Admitting: Endocrinology

## 2014-10-11 MED ORDER — OXYCODONE HCL 5 MG PO TABS: 5.0000 mg | ORAL_TABLET | ORAL | Status: DC | PRN

## 2014-10-11 NOTE — Telephone Encounter (Signed)
Pt requesting refill on pain meds please

## 2014-10-11 NOTE — Telephone Encounter (Signed)
Please advise if okay to fill in Dr. Cordelia Pen absence.

## 2014-10-11 NOTE — Telephone Encounter (Signed)
She can have 50 tablets of the oxycodone 5 mg, to take 3 times a day as needed and further refills from PCP

## 2014-10-11 NOTE — Telephone Encounter (Signed)
Patients daughter is aware, rx on your desk for signature

## 2014-10-18 ENCOUNTER — Other Ambulatory Visit: Payer: Self-pay | Admitting: Endocrinology

## 2014-10-19 DIAGNOSIS — E109 Type 1 diabetes mellitus without complications: Secondary | ICD-10-CM | POA: Diagnosis not present

## 2014-10-29 DIAGNOSIS — E109 Type 1 diabetes mellitus without complications: Secondary | ICD-10-CM | POA: Diagnosis not present

## 2014-11-08 ENCOUNTER — Encounter: Payer: Self-pay | Admitting: Endocrinology

## 2014-11-08 ENCOUNTER — Ambulatory Visit (INDEPENDENT_AMBULATORY_CARE_PROVIDER_SITE_OTHER): Payer: Medicare PPO | Admitting: Endocrinology

## 2014-11-08 VITALS — BP 135/84 | HR 69 | Temp 98.2°F

## 2014-11-08 DIAGNOSIS — E1059 Type 1 diabetes mellitus with other circulatory complications: Secondary | ICD-10-CM | POA: Diagnosis not present

## 2014-11-08 DIAGNOSIS — Z0000 Encounter for general adult medical examination without abnormal findings: Secondary | ICD-10-CM

## 2014-11-08 DIAGNOSIS — E876 Hypokalemia: Secondary | ICD-10-CM | POA: Diagnosis not present

## 2014-11-08 DIAGNOSIS — I5032 Chronic diastolic (congestive) heart failure: Secondary | ICD-10-CM

## 2014-11-08 DIAGNOSIS — N289 Disorder of kidney and ureter, unspecified: Secondary | ICD-10-CM | POA: Diagnosis not present

## 2014-11-08 DIAGNOSIS — Z0189 Encounter for other specified special examinations: Secondary | ICD-10-CM | POA: Diagnosis not present

## 2014-11-08 DIAGNOSIS — R0602 Shortness of breath: Secondary | ICD-10-CM

## 2014-11-08 DIAGNOSIS — N3000 Acute cystitis without hematuria: Secondary | ICD-10-CM

## 2014-11-08 DIAGNOSIS — IMO0002 Reserved for concepts with insufficient information to code with codable children: Secondary | ICD-10-CM

## 2014-11-08 DIAGNOSIS — M810 Age-related osteoporosis without current pathological fracture: Secondary | ICD-10-CM | POA: Diagnosis not present

## 2014-11-08 DIAGNOSIS — E1065 Type 1 diabetes mellitus with hyperglycemia: Principal | ICD-10-CM

## 2014-11-08 DIAGNOSIS — E1051 Type 1 diabetes mellitus with diabetic peripheral angiopathy without gangrene: Secondary | ICD-10-CM

## 2014-11-08 LAB — URINALYSIS, ROUTINE W REFLEX MICROSCOPIC
Bilirubin Urine: NEGATIVE
Ketones, ur: NEGATIVE
Nitrite: NEGATIVE
PH: 6 (ref 5.0–8.0)
Specific Gravity, Urine: 1.015 (ref 1.000–1.030)
TOTAL PROTEIN, URINE-UPE24: NEGATIVE
Urine Glucose: NEGATIVE
Urobilinogen, UA: 0.2 (ref 0.0–1.0)

## 2014-11-08 LAB — MICROALBUMIN / CREATININE URINE RATIO
Creatinine,U: 82.7 mg/dL
MICROALB UR: 4.5 mg/dL — AB (ref 0.0–1.9)
Microalb Creat Ratio: 5.4 mg/g (ref 0.0–30.0)

## 2014-11-08 LAB — POCT GLYCOSYLATED HEMOGLOBIN (HGB A1C): Hemoglobin A1C: 8.8

## 2014-11-08 MED ORDER — LUBIPROSTONE 24 MCG PO CAPS: ORAL_CAPSULE | ORAL | Status: DC

## 2014-11-08 MED ORDER — OXYCODONE HCL 5 MG PO TABS: 5.0000 mg | ORAL_TABLET | ORAL | Status: DC | PRN

## 2014-11-08 MED ORDER — INSULIN LISPRO PROT & LISPRO (75-25 MIX) 100 UNIT/ML KWIKPEN: 380.0000 [IU] | PEN_INJECTOR | SUBCUTANEOUS | Status: DC
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
## 2014-11-08 NOTE — Patient Instructions (Addendum)
check your blood sugar twice a day.  vary the time of day when you check, between before the 3 meals, and at bedtime.  also check if you have symptoms of your blood sugar being too high or too low.  please keep a record of the readings and bring it to your next appointment here.  You can write it on any piece of paper.  please call us sooner if your blood sugar goes below 70, or if you have a lot of readings over 200. Please come back for a follow-up appointment in 3 months.  Please increase the insulin to 380 units each morning Try eating a little less at lunch, and a light snack at bedtime.

## 2014-11-13 ENCOUNTER — Other Ambulatory Visit: Payer: Self-pay | Admitting: Endocrinology

## 2014-11-15 ENCOUNTER — Other Ambulatory Visit: Payer: Self-pay | Admitting: Endocrinology

## 2014-12-13 ENCOUNTER — Telehealth: Payer: Self-pay | Admitting: Endocrinology

## 2014-12-13 MED ORDER — OXYCODONE HCL 5 MG PO TABS: 5.0000 mg | ORAL_TABLET | ORAL | Status: DC | PRN

## 2014-12-13 NOTE — Telephone Encounter (Signed)
Pt needs a refill on her pain med please call daughter when it is ready for pick up

## 2014-12-13 NOTE — Telephone Encounter (Signed)
See below. Oxycodone was last refilled on 11/08/2014. Thanks!

## 2014-12-13 NOTE — Telephone Encounter (Signed)
i printed 

## 2014-12-13 NOTE — Telephone Encounter (Signed)
Pt's daughter advised rx is ready for pick up. Rx placed upfront.

## 2014-12-16 ENCOUNTER — Other Ambulatory Visit: Payer: Self-pay | Admitting: Endocrinology

## 2015-01-09 ENCOUNTER — Other Ambulatory Visit: Payer: Self-pay | Admitting: Endocrinology

## 2015-01-10 ENCOUNTER — Telehealth: Payer: Self-pay | Admitting: Endocrinology

## 2015-01-10 MED ORDER — OXYCODONE HCL 5 MG PO TABS: 5.0000 mg | ORAL_TABLET | ORAL | Status: DC | PRN

## 2015-01-10 NOTE — Telephone Encounter (Signed)
Patients daughter called and would like a refill on her pain medication    Please advise    Thank you

## 2015-01-10 NOTE — Telephone Encounter (Signed)
Awaiting pt pick up RX from front desk

## 2015-01-10 NOTE — Telephone Encounter (Signed)
i printed 

## 2015-01-10 NOTE — Telephone Encounter (Signed)
See note below Oxycodone was last refilled on 12/13/2014. Thanks!

## 2015-01-17 DIAGNOSIS — E109 Type 1 diabetes mellitus without complications: Secondary | ICD-10-CM | POA: Diagnosis not present

## 2015-01-27 ENCOUNTER — Encounter (HOSPITAL_COMMUNITY): Payer: Self-pay | Admitting: Emergency Medicine

## 2015-01-27 ENCOUNTER — Emergency Department (HOSPITAL_COMMUNITY): Payer: Medicare PPO

## 2015-01-27 ENCOUNTER — Emergency Department (HOSPITAL_COMMUNITY)
Admission: EM | Admit: 2015-01-27 | Discharge: 2015-01-27 | Disposition: A | Discharge status: Home / Self Care | Payer: Medicare PPO | Attending: Emergency Medicine | Admitting: Emergency Medicine

## 2015-01-27 DIAGNOSIS — F0390 Unspecified dementia without behavioral disturbance: Secondary | ICD-10-CM | POA: Diagnosis not present

## 2015-01-27 DIAGNOSIS — Z794 Long term (current) use of insulin: Secondary | ICD-10-CM | POA: Diagnosis not present

## 2015-01-27 DIAGNOSIS — S3690XA Unspecified injury of unspecified intra-abdominal organ, initial encounter: Secondary | ICD-10-CM | POA: Diagnosis not present

## 2015-01-27 DIAGNOSIS — Z7982 Long term (current) use of aspirin: Secondary | ICD-10-CM | POA: Diagnosis not present

## 2015-01-27 DIAGNOSIS — Z86010 Personal history of colonic polyps: Secondary | ICD-10-CM | POA: Insufficient documentation

## 2015-01-27 DIAGNOSIS — F329 Major depressive disorder, single episode, unspecified: Secondary | ICD-10-CM | POA: Diagnosis not present

## 2015-01-27 DIAGNOSIS — G43909 Migraine, unspecified, not intractable, without status migrainosus: Secondary | ICD-10-CM | POA: Diagnosis not present

## 2015-01-27 DIAGNOSIS — Y92003 Bedroom of unspecified non-institutional (private) residence as the place of occurrence of the external cause: Secondary | ICD-10-CM | POA: Diagnosis not present

## 2015-01-27 DIAGNOSIS — J449 Chronic obstructive pulmonary disease, unspecified: Secondary | ICD-10-CM | POA: Insufficient documentation

## 2015-01-27 DIAGNOSIS — I10 Essential (primary) hypertension: Secondary | ICD-10-CM | POA: Insufficient documentation

## 2015-01-27 DIAGNOSIS — Z8673 Personal history of transient ischemic attack (TIA), and cerebral infarction without residual deficits: Secondary | ICD-10-CM | POA: Diagnosis not present

## 2015-01-27 DIAGNOSIS — I252 Old myocardial infarction: Secondary | ICD-10-CM | POA: Diagnosis not present

## 2015-01-27 DIAGNOSIS — G40909 Epilepsy, unspecified, not intractable, without status epilepticus: Secondary | ICD-10-CM | POA: Insufficient documentation

## 2015-01-27 DIAGNOSIS — I509 Heart failure, unspecified: Secondary | ICD-10-CM | POA: Diagnosis not present

## 2015-01-27 DIAGNOSIS — S199XXA Unspecified injury of neck, initial encounter: Secondary | ICD-10-CM | POA: Insufficient documentation

## 2015-01-27 DIAGNOSIS — Y9389 Activity, other specified: Secondary | ICD-10-CM | POA: Diagnosis not present

## 2015-01-27 DIAGNOSIS — S0990XA Unspecified injury of head, initial encounter: Secondary | ICD-10-CM | POA: Insufficient documentation

## 2015-01-27 DIAGNOSIS — W19XXXA Unspecified fall, initial encounter: Secondary | ICD-10-CM

## 2015-01-27 DIAGNOSIS — Z720 Tobacco use: Secondary | ICD-10-CM | POA: Insufficient documentation

## 2015-01-27 DIAGNOSIS — E7800 Pure hypercholesterolemia, unspecified: Secondary | ICD-10-CM | POA: Diagnosis not present

## 2015-01-27 DIAGNOSIS — G8929 Other chronic pain: Secondary | ICD-10-CM | POA: Insufficient documentation

## 2015-01-27 DIAGNOSIS — G4733 Obstructive sleep apnea (adult) (pediatric): Secondary | ICD-10-CM | POA: Diagnosis not present

## 2015-01-27 DIAGNOSIS — Y998 Other external cause status: Secondary | ICD-10-CM | POA: Insufficient documentation

## 2015-01-27 DIAGNOSIS — E669 Obesity, unspecified: Secondary | ICD-10-CM | POA: Insufficient documentation

## 2015-01-27 DIAGNOSIS — S3992XA Unspecified injury of lower back, initial encounter: Secondary | ICD-10-CM | POA: Insufficient documentation

## 2015-01-27 DIAGNOSIS — W06XXXA Fall from bed, initial encounter: Secondary | ICD-10-CM | POA: Diagnosis not present

## 2015-01-27 DIAGNOSIS — R51 Headache: Secondary | ICD-10-CM | POA: Diagnosis not present

## 2015-01-27 DIAGNOSIS — Z9981 Dependence on supplemental oxygen: Secondary | ICD-10-CM | POA: Diagnosis not present

## 2015-01-27 DIAGNOSIS — K219 Gastro-esophageal reflux disease without esophagitis: Secondary | ICD-10-CM | POA: Diagnosis not present

## 2015-01-27 DIAGNOSIS — M1990 Unspecified osteoarthritis, unspecified site: Secondary | ICD-10-CM | POA: Insufficient documentation

## 2015-01-27 DIAGNOSIS — E1165 Type 2 diabetes mellitus with hyperglycemia: Secondary | ICD-10-CM | POA: Diagnosis not present

## 2015-01-27 DIAGNOSIS — E119 Type 2 diabetes mellitus without complications: Secondary | ICD-10-CM | POA: Diagnosis not present

## 2015-01-27 DIAGNOSIS — G44309 Post-traumatic headache, unspecified, not intractable: Secondary | ICD-10-CM | POA: Diagnosis not present

## 2015-01-27 DIAGNOSIS — R259 Unspecified abnormal involuntary movements: Secondary | ICD-10-CM | POA: Diagnosis not present

## 2015-01-27 LAB — CBC WITH DIFFERENTIAL/PLATELET
BASOS PCT: 0 %
Basophils Absolute: 0 10*3/uL (ref 0.0–0.1)
Eosinophils Absolute: 0.1 10*3/uL (ref 0.0–0.7)
Eosinophils Relative: 1 %
HEMATOCRIT: 41.8 % (ref 36.0–46.0)
HEMOGLOBIN: 13.7 g/dL (ref 12.0–15.0)
LYMPHS ABS: 2.2 10*3/uL (ref 0.7–4.0)
Lymphocytes Relative: 29 %
MCH: 32.2 pg (ref 26.0–34.0)
MCHC: 32.8 g/dL (ref 30.0–36.0)
MCV: 98.1 fL (ref 78.0–100.0)
MONOS PCT: 6 %
Monocytes Absolute: 0.4 10*3/uL (ref 0.1–1.0)
NEUTROS ABS: 4.9 10*3/uL (ref 1.7–7.7)
NEUTROS PCT: 64 %
Platelets: 205 10*3/uL (ref 150–400)
RBC: 4.26 MIL/uL (ref 3.87–5.11)
RDW: 13.5 % (ref 11.5–15.5)
WBC: 7.6 10*3/uL (ref 4.0–10.5)

## 2015-01-27 LAB — BASIC METABOLIC PANEL
Anion gap: 9 (ref 5–15)
BUN: 15 mg/dL (ref 6–20)
CHLORIDE: 101 mmol/L (ref 101–111)
CO2: 29 mmol/L (ref 22–32)
CREATININE: 0.88 mg/dL (ref 0.44–1.00)
Calcium: 8.8 mg/dL — ABNORMAL LOW (ref 8.9–10.3)
GFR calc non Af Amer: >60 mL/min (ref >60)
Glucose, Bld: 279 mg/dL — ABNORMAL HIGH (ref 65–99)
POTASSIUM: 3.7 mmol/L (ref 3.5–5.1)
SODIUM: 139 mmol/L (ref 135–145)

## 2015-01-27 MED ORDER — OXYCODONE-ACETAMINOPHEN 5-325 MG PO TABS
1.0000 | ORAL_TABLET | Freq: Once | ORAL | Status: AC
  Administered 2015-01-27: 1 via ORAL
  Filled 2015-01-27: qty 1
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
## 2015-01-27 NOTE — Discharge Instructions (Signed)
[name] can take her oxycodone as directed for bed pain or Tylenol for mild pain. Her blood sugar was mildly elevated today at 279. CAT scans were normal. No brain injury or broken bones Blood work otherwise normal. Return if her condition worsens for any reason or see her primary care physician.

## 2015-01-27 NOTE — ED Notes (Signed)
Per ems pt was sitting in bed yesterday, went to get out of bed and fell and struck right side of head. C/o right sided facial pain, pain behind right eye. Alert and oriented x4. Ambulatory to stretcher

## 2015-01-27 NOTE — ED Notes (Signed)
Nurse is going to come in and attempt to get the blood.

## 2015-02-06 ENCOUNTER — Other Ambulatory Visit: Payer: Self-pay | Admitting: Endocrinology

## 2015-02-10 ENCOUNTER — Telehealth: Payer: Self-pay | Admitting: Endocrinology

## 2015-02-10 MED ORDER — OXYCODONE HCL 5 MG PO TABS: 5.0000 mg | ORAL_TABLET | ORAL | Status: DC | PRN

## 2015-02-10 NOTE — Telephone Encounter (Signed)
i printed 

## 2015-02-10 NOTE — Telephone Encounter (Signed)
Please call when the rx for the pain meds is ready

## 2015-02-10 NOTE — Telephone Encounter (Signed)
Patient called requesting a refill on her Oxycodone. Last refill was 01/10/2015. Please advise if ok to refill. Thanks!

## 2015-02-10 NOTE — Telephone Encounter (Signed)
Patient's daughter advised rx is ready for pick up. Rx placed up front.

## 2015-02-14 ENCOUNTER — Other Ambulatory Visit: Payer: Self-pay | Admitting: Endocrinology

## 2015-02-17 ENCOUNTER — Encounter: Payer: Self-pay | Admitting: Endocrinology

## 2015-02-17 ENCOUNTER — Ambulatory Visit (INDEPENDENT_AMBULATORY_CARE_PROVIDER_SITE_OTHER): Payer: Medicare PPO | Admitting: Endocrinology

## 2015-02-17 VITALS — BP 134/86 | HR 77 | Resp 20 | Wt 357.8 lb

## 2015-02-17 DIAGNOSIS — IMO0001 Reserved for inherently not codable concepts without codable children: Secondary | ICD-10-CM

## 2015-02-17 DIAGNOSIS — Z794 Long term (current) use of insulin: Secondary | ICD-10-CM

## 2015-02-17 DIAGNOSIS — G894 Chronic pain syndrome: Secondary | ICD-10-CM

## 2015-02-17 DIAGNOSIS — E119 Type 2 diabetes mellitus without complications: Secondary | ICD-10-CM

## 2015-02-17 LAB — POCT GLYCOSYLATED HEMOGLOBIN (HGB A1C): Hemoglobin A1C: 8.9

## 2015-02-17 NOTE — Patient Instructions (Addendum)
check your blood sugar twice a day.  vary the time of day when you check, between before the 3 meals, and at bedtime.  also check if you have symptoms of your blood sugar being too high or too low.  please keep a record of the readings and bring it to your next appointment here.  You can write it on any piece of paper.  please call us sooner if your blood sugar goes below 70, or if you have a lot of readings over 200.    it is critically important to prevent falling down (keep floor areas well-lit, dry, and free of loose objects.  If you have a cane, walker, or wheelchair, you should use it, even for short trips around the house.  Also, try not to rush).   The next step in treating the diabetes is to bring a blood sugar record, for [place] to go over together.   Please come back for a follow-up appointment in 3 months.
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
## 2015-03-06 ENCOUNTER — Other Ambulatory Visit: Payer: Self-pay | Admitting: Endocrinology

## 2015-03-12 ENCOUNTER — Telehealth: Payer: Self-pay | Admitting: Endocrinology

## 2015-03-12 MED ORDER — OXYCODONE HCL 5 MG PO TABS: 5.0000 mg | ORAL_TABLET | ORAL | Status: DC | PRN

## 2015-03-12 NOTE — Telephone Encounter (Signed)
OK to refill

## 2015-03-12 NOTE — Telephone Encounter (Signed)
Please read message below and advise in Dr Ellison's absence.  

## 2015-03-12 NOTE — Telephone Encounter (Signed)
Refill completed and called pt.

## 2015-03-12 NOTE — Telephone Encounter (Signed)
Pt is in need of pain meds please

## 2015-03-20 ENCOUNTER — Other Ambulatory Visit: Payer: Self-pay | Admitting: Endocrinology

## 2015-04-08 ENCOUNTER — Telehealth: Payer: Self-pay | Admitting: Endocrinology

## 2015-04-08 MED ORDER — OXYCODONE HCL 5 MG PO TABS: 5.0000 mg | ORAL_TABLET | ORAL | Status: DC | PRN

## 2015-04-08 NOTE — Telephone Encounter (Signed)
i printed 

## 2015-04-08 NOTE — Telephone Encounter (Signed)
See note below   Thanks

## 2015-04-08 NOTE — Telephone Encounter (Signed)
Patients daughter called stating that she would like a refill on her pain medication  Will pick up on Friday    Please advise    Thank you

## 2015-04-08 NOTE — Telephone Encounter (Signed)
Pt's daughter advised rx is ready for pick up. Rx placed upfront.  

## 2015-04-10 ENCOUNTER — Telehealth: Payer: Self-pay | Admitting: Endocrinology

## 2015-04-10 MED ORDER — LUBIPROSTONE 24 MCG PO CAPS: ORAL_CAPSULE | ORAL | Status: DC

## 2015-04-10 MED ORDER — FUROSEMIDE 40 MG PO TABS: 40.0000 mg | ORAL_TABLET | Freq: Two times a day (BID) | ORAL | Status: DC

## 2015-04-10 NOTE — Telephone Encounter (Signed)
Please refill amitiza and lasix prn

## 2015-04-10 NOTE — Telephone Encounter (Signed)
Rx submitted per pt's request.  

## 2015-04-10 NOTE — Telephone Encounter (Signed)
Patient daughter stated her mom (Patient) need refill of her fluid pill, and stool softner, (she didn't know the name of the medication) send to  [id number] - [place], [address] AT [place] [phone] (Phone) [phone] (Fax)

## 2015-04-18 DIAGNOSIS — E109 Type 1 diabetes mellitus without complications: Secondary | ICD-10-CM | POA: Diagnosis not present

## 2015-05-05 ENCOUNTER — Encounter (HOSPITAL_COMMUNITY): Payer: Self-pay | Admitting: Family Medicine

## 2015-05-05 ENCOUNTER — Encounter: Payer: Self-pay | Admitting: Endocrinology

## 2015-05-05 ENCOUNTER — Emergency Department (HOSPITAL_COMMUNITY)
Admission: EM | Admit: 2015-05-05 | Discharge: 2015-05-05 | Discharge status: Against Medical Advice | Payer: Medicare Other | Source: Home / Self Care

## 2015-05-05 ENCOUNTER — Emergency Department (HOSPITAL_COMMUNITY): Payer: Medicare Other

## 2015-05-05 ENCOUNTER — Ambulatory Visit (INDEPENDENT_AMBULATORY_CARE_PROVIDER_SITE_OTHER): Payer: Medicare Other | Admitting: Endocrinology

## 2015-05-05 VITALS — BP 136/78 | HR 72 | Temp 98.4°F

## 2015-05-05 DIAGNOSIS — E1065 Type 1 diabetes mellitus with hyperglycemia: Secondary | ICD-10-CM

## 2015-05-05 DIAGNOSIS — E1051 Type 1 diabetes mellitus with diabetic peripheral angiopathy without gangrene: Secondary | ICD-10-CM | POA: Diagnosis not present

## 2015-05-05 DIAGNOSIS — R51 Headache: Secondary | ICD-10-CM

## 2015-05-05 DIAGNOSIS — R079 Chest pain, unspecified: Secondary | ICD-10-CM

## 2015-05-05 DIAGNOSIS — R1110 Vomiting, unspecified: Secondary | ICD-10-CM

## 2015-05-05 DIAGNOSIS — IMO0002 Reserved for concepts with insufficient information to code with codable children: Secondary | ICD-10-CM

## 2015-05-05 DIAGNOSIS — J189 Pneumonia, unspecified organism: Secondary | ICD-10-CM | POA: Diagnosis not present

## 2015-05-05 LAB — COMPREHENSIVE METABOLIC PANEL
ALT: 13 U/L — ABNORMAL LOW (ref 14–54)
ANION GAP: 15 (ref 5–15)
AST: 20 U/L (ref 15–41)
Albumin: 3.2 g/dL — ABNORMAL LOW (ref 3.5–5.0)
Alkaline Phosphatase: 79 U/L (ref 38–126)
BUN: 16 mg/dL (ref 6–20)
CHLORIDE: 93 mmol/L — AB (ref 101–111)
CO2: 32 mmol/L (ref 22–32)
Calcium: 8.8 mg/dL — ABNORMAL LOW (ref 8.9–10.3)
Creatinine, Ser: 2.2 mg/dL — ABNORMAL HIGH (ref 0.44–1.00)
GFR, EST AFRICAN AMERICAN: 24 mL/min — AB (ref >60)
GFR, EST NON AFRICAN AMERICAN: 21 mL/min — AB (ref >60)
Glucose, Bld: 345 mg/dL — ABNORMAL HIGH (ref 65–99)
Potassium: 3.7 mmol/L (ref 3.5–5.1)
SODIUM: 140 mmol/L (ref 135–145)
Total Bilirubin: 1.2 mg/dL (ref 0.3–1.2)
Total Protein: 7.3 g/dL (ref 6.5–8.1)

## 2015-05-05 LAB — LIPASE, BLOOD: LIPASE: 30 U/L (ref 11–51)

## 2015-05-05 LAB — POCT GLYCOSYLATED HEMOGLOBIN (HGB A1C): HEMOGLOBIN A1C: 107.7

## 2015-05-05 LAB — CBC
HCT: 46.5 % — ABNORMAL HIGH (ref 36.0–46.0)
HEMOGLOBIN: 15.1 g/dL — AB (ref 12.0–15.0)
MCH: 31.7 pg (ref 26.0–34.0)
MCHC: 32.5 g/dL (ref 30.0–36.0)
MCV: 97.5 fL (ref 78.0–100.0)
Platelets: 265 10*3/uL (ref 150–400)
RBC: 4.77 MIL/uL (ref 3.87–5.11)
RDW: 13.1 % (ref 11.5–15.5)
WBC: 6.4 10*3/uL (ref 4.0–10.5)

## 2015-05-05 LAB — CBG MONITORING, ED: GLUCOSE-CAPILLARY: 305 mg/dL — AB (ref 65–99)

## 2015-05-05 NOTE — ED Notes (Signed)
Pt here for cough, N,V unable to hold down any fluids. sts also blood sugar is high. sts headache

## 2015-05-05 NOTE — ED Notes (Signed)
Pt did not answer when name was called

## 2015-05-05 NOTE — Patient Instructions (Addendum)
Please go the [facility] ER now.  I have called there, so they will expect you.
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
## 2015-05-05 NOTE — ED Notes (Signed)
Sent from endo, patient with vomiting x 2 weeks, needs CT and labs

## 2015-05-06 ENCOUNTER — Encounter (HOSPITAL_COMMUNITY): Payer: Self-pay | Admitting: *Deleted

## 2015-05-06 ENCOUNTER — Emergency Department (HOSPITAL_COMMUNITY): Payer: Medicare Other

## 2015-05-06 ENCOUNTER — Inpatient Hospital Stay (HOSPITAL_COMMUNITY)
Admission: EM | Admit: 2015-05-06 | Discharge: 2015-05-11 | DRG: 194 | Disposition: A | Discharge status: Rehabilitation Facility | Payer: Medicare Other | Attending: Internal Medicine | Admitting: Internal Medicine

## 2015-05-06 ENCOUNTER — Telehealth: Payer: Self-pay | Admitting: Endocrinology

## 2015-05-06 DIAGNOSIS — I5032 Chronic diastolic (congestive) heart failure: Secondary | ICD-10-CM

## 2015-05-06 DIAGNOSIS — F329 Major depressive disorder, single episode, unspecified: Secondary | ICD-10-CM | POA: Diagnosis present

## 2015-05-06 DIAGNOSIS — E1143 Type 2 diabetes mellitus with diabetic autonomic (poly)neuropathy: Secondary | ICD-10-CM | POA: Diagnosis present

## 2015-05-06 DIAGNOSIS — R197 Diarrhea, unspecified: Secondary | ICD-10-CM

## 2015-05-06 DIAGNOSIS — Z8041 Family history of malignant neoplasm of ovary: Secondary | ICD-10-CM

## 2015-05-06 DIAGNOSIS — I509 Heart failure, unspecified: Secondary | ICD-10-CM | POA: Diagnosis present

## 2015-05-06 DIAGNOSIS — J189 Pneumonia, unspecified organism: Principal | ICD-10-CM

## 2015-05-06 DIAGNOSIS — Z800 Family history of malignant neoplasm of digestive organs: Secondary | ICD-10-CM

## 2015-05-06 DIAGNOSIS — F17210 Nicotine dependence, cigarettes, uncomplicated: Secondary | ICD-10-CM | POA: Diagnosis present

## 2015-05-06 DIAGNOSIS — E785 Hyperlipidemia, unspecified: Secondary | ICD-10-CM | POA: Diagnosis present

## 2015-05-06 DIAGNOSIS — E118 Type 2 diabetes mellitus with unspecified complications: Secondary | ICD-10-CM | POA: Diagnosis not present

## 2015-05-06 DIAGNOSIS — Z8249 Family history of ischemic heart disease and other diseases of the circulatory system: Secondary | ICD-10-CM

## 2015-05-06 DIAGNOSIS — I10 Essential (primary) hypertension: Secondary | ICD-10-CM

## 2015-05-06 DIAGNOSIS — K3184 Gastroparesis: Secondary | ICD-10-CM | POA: Diagnosis present

## 2015-05-06 DIAGNOSIS — R609 Edema, unspecified: Secondary | ICD-10-CM

## 2015-05-06 DIAGNOSIS — J440 Chronic obstructive pulmonary disease with acute lower respiratory infection: Secondary | ICD-10-CM | POA: Diagnosis present

## 2015-05-06 DIAGNOSIS — E860 Dehydration: Secondary | ICD-10-CM | POA: Diagnosis present

## 2015-05-06 DIAGNOSIS — N179 Acute kidney failure, unspecified: Secondary | ICD-10-CM | POA: Diagnosis not present

## 2015-05-06 DIAGNOSIS — Z6841 Body Mass Index (BMI) 40.0 and over, adult: Secondary | ICD-10-CM

## 2015-05-06 DIAGNOSIS — R112 Nausea with vomiting, unspecified: Secondary | ICD-10-CM

## 2015-05-06 DIAGNOSIS — K219 Gastro-esophageal reflux disease without esophagitis: Secondary | ICD-10-CM | POA: Diagnosis present

## 2015-05-06 DIAGNOSIS — Z832 Family history of diseases of the blood and blood-forming organs and certain disorders involving the immune mechanism: Secondary | ICD-10-CM

## 2015-05-06 LAB — URINALYSIS, ROUTINE W REFLEX MICROSCOPIC
Glucose, UA: 100 mg/dL — AB
Hgb urine dipstick: NEGATIVE
KETONES UR: 15 mg/dL — AB
LEUKOCYTES UA: NEGATIVE
Nitrite: NEGATIVE
PROTEIN: 30 mg/dL — AB
Specific Gravity, Urine: 1.023 (ref 1.005–1.030)
pH: 5 (ref 5.0–8.0)

## 2015-05-06 LAB — CBC WITH DIFFERENTIAL/PLATELET
BASOS PCT: 0 %
Basophils Absolute: 0 10*3/uL (ref 0.0–0.1)
EOS PCT: 2 %
Eosinophils Absolute: 0.1 10*3/uL (ref 0.0–0.7)
HEMATOCRIT: 44.3 % (ref 36.0–46.0)
Hemoglobin: 14.9 g/dL (ref 12.0–15.0)
LYMPHS PCT: 41 %
Lymphs Abs: 2.2 10*3/uL (ref 0.7–4.0)
MCH: 32.7 pg (ref 26.0–34.0)
MCHC: 33.6 g/dL (ref 30.0–36.0)
MCV: 97.1 fL (ref 78.0–100.0)
MONO ABS: 0.5 10*3/uL (ref 0.1–1.0)
MONOS PCT: 10 %
NEUTROS ABS: 2.5 10*3/uL (ref 1.7–7.7)
Neutrophils Relative %: 47 %
Platelets: 250 10*3/uL (ref 150–400)
RBC: 4.56 MIL/uL (ref 3.87–5.11)
RDW: 13.4 % (ref 11.5–15.5)
WBC: 5.3 10*3/uL (ref 4.0–10.5)

## 2015-05-06 LAB — STREP PNEUMONIAE URINARY ANTIGEN: Strep Pneumo Urinary Antigen: NEGATIVE

## 2015-05-06 LAB — I-STAT TROPONIN, ED: Troponin i, poc: 0.02 ng/mL (ref 0.00–0.08)

## 2015-05-06 LAB — URINE MICROSCOPIC-ADD ON

## 2015-05-06 LAB — CBG MONITORING, ED
GLUCOSE-CAPILLARY: 400 mg/dL — AB (ref 65–99)
Glucose-Capillary: 254 mg/dL — ABNORMAL HIGH (ref 65–99)

## 2015-05-06 LAB — BASIC METABOLIC PANEL
Anion gap: 13 (ref 5–15)
BUN: 21 mg/dL — ABNORMAL HIGH (ref 6–20)
CALCIUM: 8.5 mg/dL — AB (ref 8.9–10.3)
CO2: 33 mmol/L — AB (ref 22–32)
CREATININE: 2.14 mg/dL — AB (ref 0.44–1.00)
Chloride: 91 mmol/L — ABNORMAL LOW (ref 101–111)
GFR calc Af Amer: 25 mL/min — ABNORMAL LOW (ref >60)
GFR calc non Af Amer: 22 mL/min — ABNORMAL LOW (ref >60)
GLUCOSE: 340 mg/dL — AB (ref 65–99)
Potassium: 3.7 mmol/L (ref 3.5–5.1)
Sodium: 137 mmol/L (ref 135–145)

## 2015-05-06 LAB — PROCALCITONIN

## 2015-05-06 LAB — GLUCOSE, CAPILLARY: Glucose-Capillary: 462 mg/dL — ABNORMAL HIGH (ref 65–99)

## 2015-05-06 LAB — I-STAT CG4 LACTIC ACID, ED: Lactic Acid, Venous: 1.72 mmol/L (ref 0.5–2.0)

## 2015-05-06 MED ORDER — ACETAMINOPHEN 325 MG PO TABS
650.0000 mg | ORAL_TABLET | Freq: Four times a day (QID) | ORAL | Status: DC | PRN
  Administered 2015-05-10 – 2015-05-11 (×2): 650 mg via ORAL
  Filled 2015-05-06 (×2): qty 2

## 2015-05-06 MED ORDER — FUROSEMIDE 40 MG PO TABS
40.0000 mg | ORAL_TABLET | Freq: Two times a day (BID) | ORAL | Status: DC
  Administered 2015-05-06: 40 mg via ORAL
  Filled 2015-05-06: qty 2

## 2015-05-06 MED ORDER — IPRATROPIUM-ALBUTEROL 0.5-2.5 (3) MG/3ML IN SOLN
3.0000 mL | Freq: Four times a day (QID) | RESPIRATORY_TRACT | Status: DC
  Administered 2015-05-06: 3 mL via RESPIRATORY_TRACT
  Filled 2015-05-06: qty 3

## 2015-05-06 MED ORDER — PANTOPRAZOLE SODIUM 40 MG PO TBEC
80.0000 mg | DELAYED_RELEASE_TABLET | Freq: Every day | ORAL | Status: DC
  Administered 2015-05-07 – 2015-05-11 (×5): 80 mg via ORAL
  Filled 2015-05-06 (×5): qty 2

## 2015-05-06 MED ORDER — BUPROPION HCL ER (XL) 300 MG PO TB24
300.0000 mg | ORAL_TABLET | Freq: Every day | ORAL | Status: DC
  Administered 2015-05-07 – 2015-05-11 (×5): 300 mg via ORAL
  Filled 2015-05-06 (×6): qty 1

## 2015-05-06 MED ORDER — ACETAMINOPHEN 650 MG RE SUPP: 650.0000 mg | Freq: Four times a day (QID) | RECTAL | Status: DC | PRN

## 2015-05-06 MED ORDER — ACETAMINOPHEN 500 MG PO TABS
1000.0000 mg | ORAL_TABLET | Freq: Once | ORAL | Status: AC
  Administered 2015-05-06: 1000 mg via ORAL
  Filled 2015-05-06: qty 2

## 2015-05-06 MED ORDER — INSULIN ASPART 100 UNIT/ML ~~LOC~~ SOLN
0.0000 [IU] | Freq: Every day | SUBCUTANEOUS | Status: DC
  Administered 2015-05-06: 5 [IU] via SUBCUTANEOUS

## 2015-05-06 MED ORDER — METHYLPREDNISOLONE SODIUM SUCC 125 MG IJ SOLR
125.0000 mg | Freq: Once | INTRAMUSCULAR | Status: AC
  Administered 2015-05-06: 125 mg via INTRAVENOUS
  Filled 2015-05-06: qty 2

## 2015-05-06 MED ORDER — SODIUM CHLORIDE 0.9 % IV BOLUS (SEPSIS)
1000.0000 mL | Freq: Once | INTRAVENOUS | Status: AC
  Administered 2015-05-06: 1000 mL via INTRAVENOUS

## 2015-05-06 MED ORDER — ISOSORBIDE MONONITRATE ER 60 MG PO TB24
120.0000 mg | ORAL_TABLET | Freq: Every day | ORAL | Status: DC
  Administered 2015-05-07 – 2015-05-11 (×5): 120 mg via ORAL
  Filled 2015-05-06 (×5): qty 2

## 2015-05-06 MED ORDER — OXYCODONE HCL 5 MG PO TABS
5.0000 mg | ORAL_TABLET | ORAL | Status: DC | PRN
  Administered 2015-05-06 – 2015-05-10 (×5): 5 mg via ORAL
  Filled 2015-05-06 (×6): qty 1

## 2015-05-06 MED ORDER — DEXTROSE 5 % IV SOLN
1.0000 g | INTRAVENOUS | Status: DC
  Administered 2015-05-07 – 2015-05-11 (×5): 1 g via INTRAVENOUS
  Filled 2015-05-06 (×5): qty 10

## 2015-05-06 MED ORDER — SIMVASTATIN 20 MG PO TABS
20.0000 mg | ORAL_TABLET | Freq: Every day | ORAL | Status: DC
  Administered 2015-05-06 – 2015-05-11 (×6): 20 mg via ORAL
  Filled 2015-05-06 (×2): qty 1
  Filled 2015-05-06 (×2): qty 2
  Filled 2015-05-06: qty 1

## 2015-05-06 MED ORDER — INSULIN ASPART 100 UNIT/ML ~~LOC~~ SOLN
0.0000 [IU] | Freq: Three times a day (TID) | SUBCUTANEOUS | Status: DC
Start: 2015-05-06 — End: 2015-05-07
  Administered 2015-05-06: 20 [IU] via SUBCUTANEOUS
  Filled 2015-05-06 (×2): qty 1

## 2015-05-06 MED ORDER — ASPIRIN EC 81 MG PO TBEC
81.0000 mg | DELAYED_RELEASE_TABLET | Freq: Every day | ORAL | Status: DC
  Administered 2015-05-07 – 2015-05-11 (×5): 81 mg via ORAL
  Filled 2015-05-06 (×5): qty 1

## 2015-05-06 MED ORDER — DEXTROSE 5 % IV SOLN
500.0000 mg | Freq: Once | INTRAVENOUS | Status: AC
  Administered 2015-05-06: 500 mg via INTRAVENOUS
  Filled 2015-05-06: qty 500

## 2015-05-06 MED ORDER — POTASSIUM CHLORIDE CRYS ER 20 MEQ PO TBCR
20.0000 meq | EXTENDED_RELEASE_TABLET | Freq: Every day | ORAL | Status: DC
  Administered 2015-05-07 – 2015-05-11 (×5): 20 meq via ORAL
  Filled 2015-05-06 (×5): qty 1

## 2015-05-06 MED ORDER — ONDANSETRON HCL 4 MG/2ML IJ SOLN
4.0000 mg | Freq: Four times a day (QID) | INTRAMUSCULAR | Status: DC | PRN
  Administered 2015-05-06: 4 mg via INTRAVENOUS
  Filled 2015-05-06: qty 2

## 2015-05-06 MED ORDER — RISAQUAD PO CAPS
1.0000 | ORAL_CAPSULE | Freq: Every day | ORAL | Status: DC
  Administered 2015-05-07 – 2015-05-11 (×5): 1 via ORAL
  Filled 2015-05-06 (×5): qty 1

## 2015-05-06 MED ORDER — HEPARIN SODIUM (PORCINE) 5000 UNIT/ML IJ SOLN
5000.0000 [IU] | Freq: Three times a day (TID) | INTRAMUSCULAR | Status: DC
Start: 2015-05-06 — End: 2015-05-12
  Administered 2015-05-06 – 2015-05-11 (×16): 5000 [IU] via SUBCUTANEOUS
  Filled 2015-05-06 (×16): qty 1

## 2015-05-06 MED ORDER — IPRATROPIUM-ALBUTEROL 0.5-2.5 (3) MG/3ML IN SOLN: 3.0000 mL | RESPIRATORY_TRACT | Status: DC | PRN

## 2015-05-06 MED ORDER — LEVETIRACETAM 750 MG PO TABS
750.0000 mg | ORAL_TABLET | Freq: Two times a day (BID) | ORAL | Status: DC
  Administered 2015-05-06 – 2015-05-11 (×11): 750 mg via ORAL
  Filled 2015-05-06 (×10): qty 1

## 2015-05-06 MED ORDER — AMLODIPINE BESYLATE 2.5 MG PO TABS
2.5000 mg | ORAL_TABLET | Freq: Every day | ORAL | Status: DC
  Filled 2015-05-06: qty 1

## 2015-05-06 MED ORDER — INSULIN ASPART 100 UNIT/ML ~~LOC~~ SOLN
10.0000 [IU] | Freq: Once | SUBCUTANEOUS | Status: AC
  Administered 2015-05-06: 10 [IU] via INTRAVENOUS
  Filled 2015-05-06: qty 1

## 2015-05-06 MED ORDER — HYDROCHLOROTHIAZIDE 25 MG PO TABS: 25.0000 mg | ORAL_TABLET | Freq: Every day | ORAL | Status: DC

## 2015-05-06 MED ORDER — ONDANSETRON HCL 4 MG PO TABS: 4.0000 mg | ORAL_TABLET | Freq: Four times a day (QID) | ORAL | Status: DC | PRN

## 2015-05-06 MED ORDER — AZITHROMYCIN 500 MG PO TABS
500.0000 mg | ORAL_TABLET | ORAL | Status: DC
  Administered 2015-05-07 – 2015-05-11 (×5): 500 mg via ORAL
  Filled 2015-05-06 (×5): qty 1

## 2015-05-06 MED ORDER — SODIUM CHLORIDE 0.9 % IV SOLN: INTRAVENOUS | Status: DC

## 2015-05-06 MED ORDER — BUDESONIDE-FORMOTEROL FUMARATE 160-4.5 MCG/ACT IN AERO
2.0000 | INHALATION_SPRAY | Freq: Two times a day (BID) | RESPIRATORY_TRACT | Status: DC
  Administered 2015-05-07 – 2015-05-11 (×7): 2 via RESPIRATORY_TRACT
  Filled 2015-05-06: qty 6

## 2015-05-06 MED ORDER — DEXTROSE 5 % IV SOLN
1.0000 g | Freq: Once | INTRAVENOUS | Status: AC
  Administered 2015-05-06: 1 g via INTRAVENOUS
  Filled 2015-05-06: qty 10

## 2015-05-06 NOTE — ED Notes (Signed)
Pt here with multiple complaints.  Pt was here last night and left before being seen.  Triage completed blood work, EKG, and chest ray last night before pt left.

## 2015-05-06 NOTE — ED Notes (Signed)
Patient up to use bedside commode.  Able to use it independently with RN monitoring.  Pt leans on bed and counter when up moving.  Pt steady on feet.  Pt becomes SOB when up, but saturations maintain above 95%.

## 2015-05-06 NOTE — Telephone Encounter (Signed)
please call patient's son Bryson Corona Yanko: What are you requesting the form to say: Are you with pt all the time, or just to take her to the doctor? Does this refer to just her current illness, or ongoing?
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
## 2015-05-06 NOTE — ED Notes (Signed)
Pt states she vomited after attempting to eat her dinner.  Will provide with zofran and recheck CBG

## 2015-05-06 NOTE — ED Notes (Signed)
Pt complaining of a sharp cramping feeling under right breast.  Pt is asking for something to wet her mouth

## 2015-05-06 NOTE — Progress Notes (Signed)
Patient received into 5w37 alert and oriented. Patient oriented to the room and educated on the need to use the call light when in need of help. Safety measures put in place and call bell placed within reach. Will keep monitoring.

## 2015-05-06 NOTE — ED Notes (Signed)
Attempted Report x1.   

## 2015-05-06 NOTE — ED Notes (Signed)
CBG 254 

## 2015-05-07 ENCOUNTER — Other Ambulatory Visit (HOSPITAL_COMMUNITY): Payer: Medicare PPO

## 2015-05-07 DIAGNOSIS — I509 Heart failure, unspecified: Secondary | ICD-10-CM | POA: Diagnosis present

## 2015-05-07 DIAGNOSIS — F17210 Nicotine dependence, cigarettes, uncomplicated: Secondary | ICD-10-CM | POA: Diagnosis present

## 2015-05-07 DIAGNOSIS — R0600 Dyspnea, unspecified: Secondary | ICD-10-CM | POA: Diagnosis not present

## 2015-05-07 DIAGNOSIS — Z8249 Family history of ischemic heart disease and other diseases of the circulatory system: Secondary | ICD-10-CM | POA: Diagnosis not present

## 2015-05-07 DIAGNOSIS — E785 Hyperlipidemia, unspecified: Secondary | ICD-10-CM | POA: Diagnosis present

## 2015-05-07 DIAGNOSIS — I10 Essential (primary) hypertension: Secondary | ICD-10-CM | POA: Diagnosis present

## 2015-05-07 DIAGNOSIS — E118 Type 2 diabetes mellitus with unspecified complications: Secondary | ICD-10-CM | POA: Diagnosis not present

## 2015-05-07 DIAGNOSIS — Z6841 Body Mass Index (BMI) 40.0 and over, adult: Secondary | ICD-10-CM | POA: Diagnosis not present

## 2015-05-07 DIAGNOSIS — Z8041 Family history of malignant neoplasm of ovary: Secondary | ICD-10-CM | POA: Diagnosis not present

## 2015-05-07 DIAGNOSIS — E860 Dehydration: Secondary | ICD-10-CM | POA: Diagnosis present

## 2015-05-07 DIAGNOSIS — Z800 Family history of malignant neoplasm of digestive organs: Secondary | ICD-10-CM | POA: Diagnosis not present

## 2015-05-07 DIAGNOSIS — R609 Edema, unspecified: Secondary | ICD-10-CM | POA: Diagnosis not present

## 2015-05-07 DIAGNOSIS — E1143 Type 2 diabetes mellitus with diabetic autonomic (poly)neuropathy: Secondary | ICD-10-CM | POA: Diagnosis present

## 2015-05-07 DIAGNOSIS — N179 Acute kidney failure, unspecified: Secondary | ICD-10-CM | POA: Diagnosis present

## 2015-05-07 DIAGNOSIS — K219 Gastro-esophageal reflux disease without esophagitis: Secondary | ICD-10-CM | POA: Diagnosis present

## 2015-05-07 DIAGNOSIS — Z832 Family history of diseases of the blood and blood-forming organs and certain disorders involving the immune mechanism: Secondary | ICD-10-CM | POA: Diagnosis not present

## 2015-05-07 DIAGNOSIS — K3184 Gastroparesis: Secondary | ICD-10-CM | POA: Diagnosis present

## 2015-05-07 DIAGNOSIS — J440 Chronic obstructive pulmonary disease with acute lower respiratory infection: Secondary | ICD-10-CM | POA: Diagnosis present

## 2015-05-07 DIAGNOSIS — F329 Major depressive disorder, single episode, unspecified: Secondary | ICD-10-CM | POA: Diagnosis present

## 2015-05-07 DIAGNOSIS — J189 Pneumonia, unspecified organism: Secondary | ICD-10-CM | POA: Diagnosis present

## 2015-05-07 LAB — CBC
HEMATOCRIT: 43.2 % (ref 36.0–46.0)
HEMOGLOBIN: 14.1 g/dL (ref 12.0–15.0)
MCH: 31.8 pg (ref 26.0–34.0)
MCHC: 32.6 g/dL (ref 30.0–36.0)
MCV: 97.3 fL (ref 78.0–100.0)
Platelets: 275 10*3/uL (ref 150–400)
RBC: 4.44 MIL/uL (ref 3.87–5.11)
RDW: 13 % (ref 11.5–15.5)
WBC: 4.9 10*3/uL (ref 4.0–10.5)

## 2015-05-07 LAB — LEGIONELLA ANTIGEN, URINE

## 2015-05-07 LAB — BASIC METABOLIC PANEL
ANION GAP: 9 (ref 5–15)
BUN: 23 mg/dL — ABNORMAL HIGH (ref 6–20)
CALCIUM: 7.9 mg/dL — AB (ref 8.9–10.3)
CO2: 31 mmol/L (ref 22–32)
CREATININE: 2.02 mg/dL — AB (ref 0.44–1.00)
Chloride: 94 mmol/L — ABNORMAL LOW (ref 101–111)
GFR, EST AFRICAN AMERICAN: 27 mL/min — AB (ref >60)
GFR, EST NON AFRICAN AMERICAN: 23 mL/min — AB (ref >60)
GLUCOSE: 458 mg/dL — AB (ref 65–99)
Potassium: 4.3 mmol/L (ref 3.5–5.1)
Sodium: 134 mmol/L — ABNORMAL LOW (ref 135–145)

## 2015-05-07 LAB — HEMOGLOBIN A1C
Hgb A1c MFr Bld: 10.7 % — ABNORMAL HIGH (ref 4.8–5.6)
Mean Plasma Glucose: 260 mg/dL

## 2015-05-07 LAB — GLUCOSE, CAPILLARY
GLUCOSE-CAPILLARY: 465 mg/dL — AB (ref 65–99)
GLUCOSE-CAPILLARY: 443 mg/dL — AB (ref 65–99)
GLUCOSE-CAPILLARY: 445 mg/dL — AB (ref 65–99)
GLUCOSE-CAPILLARY: 414 mg/dL — AB (ref 65–99)
GLUCOSE-CAPILLARY: 238 mg/dL — AB (ref 65–99)
GLUCOSE-CAPILLARY: 223 mg/dL — AB (ref 65–99)
Glucose-Capillary: 392 mg/dL — ABNORMAL HIGH (ref 65–99)
Glucose-Capillary: 405 mg/dL — ABNORMAL HIGH (ref 65–99)
Glucose-Capillary: 434 mg/dL — ABNORMAL HIGH (ref 65–99)
Glucose-Capillary: 368 mg/dL — ABNORMAL HIGH (ref 65–99)
Glucose-Capillary: 324 mg/dL — ABNORMAL HIGH (ref 65–99)
Glucose-Capillary: 293 mg/dL — ABNORMAL HIGH (ref 65–99)
Glucose-Capillary: 253 mg/dL — ABNORMAL HIGH (ref 65–99)
Glucose-Capillary: 204 mg/dL — ABNORMAL HIGH (ref 65–99)
Glucose-Capillary: 225 mg/dL — ABNORMAL HIGH (ref 65–99)
Glucose-Capillary: 202 mg/dL — ABNORMAL HIGH (ref 65–99)

## 2015-05-07 LAB — HIV ANTIBODY (ROUTINE TESTING W REFLEX): HIV SCREEN 4TH GENERATION: NONREACTIVE

## 2015-05-07 LAB — TROPONIN I: Troponin I: <0.03 ng/mL (ref <0.031)

## 2015-05-07 LAB — BRAIN NATRIURETIC PEPTIDE: B NATRIURETIC PEPTIDE 5: 61.2 pg/mL (ref 0.0–100.0)

## 2015-05-07 MED ORDER — DEXTROSE 50 % IV SOLN: 25.0000 mL | INTRAVENOUS | Status: DC | PRN

## 2015-05-07 MED ORDER — INSULIN GLARGINE 100 UNIT/ML ~~LOC~~ SOLN
10.0000 [IU] | Freq: Once | SUBCUTANEOUS | Status: AC
  Administered 2015-05-07: 10 [IU] via SUBCUTANEOUS
  Filled 2015-05-07: qty 0.1

## 2015-05-07 MED ORDER — DEXTROSE-NACL 5-0.45 % IV SOLN: INTRAVENOUS | Status: DC

## 2015-05-07 MED ORDER — INSULIN ASPART 100 UNIT/ML ~~LOC~~ SOLN
15.0000 [IU] | Freq: Once | SUBCUTANEOUS | Status: AC
  Administered 2015-05-07: 15 [IU] via INTRAVENOUS

## 2015-05-07 MED ORDER — SODIUM CHLORIDE 0.9 % IV SOLN
INTRAVENOUS | Status: DC
Start: 2015-05-07 — End: 2015-05-12
  Administered 2015-05-07 – 2015-05-11 (×7): via INTRAVENOUS

## 2015-05-07 MED ORDER — SODIUM CHLORIDE 0.9 % IV SOLN
INTRAVENOUS | Status: DC
  Administered 2015-05-07: 3.5 [IU]/h via INTRAVENOUS
  Filled 2015-05-07: qty 2.5

## 2015-05-07 MED ORDER — INSULIN REGULAR BOLUS VIA INFUSION
0.0000 [IU] | Freq: Three times a day (TID) | INTRAVENOUS | Status: DC
  Administered 2015-05-07: 7.3 [IU] via INTRAVENOUS
  Administered 2015-05-07: 7 [IU] via INTRAVENOUS
  Filled 2015-05-07: qty 10

## 2015-05-07 MED ORDER — LABETALOL HCL 100 MG PO TABS
100.0000 mg | ORAL_TABLET | Freq: Two times a day (BID) | ORAL | Status: DC
  Administered 2015-05-08 – 2015-05-11 (×9): 100 mg via ORAL
  Filled 2015-05-07 (×9): qty 1

## 2015-05-07 MED ORDER — SODIUM CHLORIDE 0.9 % IV SOLN
INTRAVENOUS | Status: DC
Start: 2015-05-07 — End: 2015-05-08
  Filled 2015-05-07: qty 2.5

## 2015-05-07 NOTE — Progress Notes (Signed)
Pt CBG 405. Dr. Allyson Sabal paged. MD ordered Pt to be on Insulin drip.

## 2015-05-07 NOTE — Progress Notes (Addendum)
[facility] PROGRESS NOTE  [name] [id number] DOB: 1940/05/13 DOA: 05/06/2015 PCP: Renato Shin, MD  Length of stay:    Assessment/Plan: Active Problems:   CAP (community acquired pneumonia)   AKI (acute kidney injury) ([place])   Nausea vomiting and diarrhea   Essential hypertension   Diabetes mellitus with complication ([place])   Dependent edema   HLD (hyperlipidemia)    75 y.o. female presents with 2 weeks of ongoing cough, nausea, vomiting, loss of appetite and has had decreased urine output over the last few days. He came to emergency department last night and left without being seen after triage. Chest x-ray at that time showed focal consolidation suspicious for pneumonia  . At that time she was also found to have an acute kidney injury but left prior to evaluation. She returns today for her evaluation, she is currently wheezing and has some increased effort but no evidence of hypoxemia. Has history of COPD and CHF documented but notably has preserved ejection fraction. Given IV fluids and Rocephin/azithromycin for community-acquired pneumonia coverage. Nebulizer treatments started for wheezing and Solu-Medrol administered for reactive airway component. Hospitalist was consulted for admission     Assessment and plan  CAP: CXR and symptoms suggestive of CAP. Lactic acid 1.7, trop nml, WBC 5.3, on RA.  -place on tele  - cont CTX, Azithro, day 2 - mucinex - DUonebs - DC Steroids - procalcitonin <0.10 - Sputum Cx - legionella and strep ag   AKI: Cr. 2.14, basleine 0.9. Suspect dehydration - cont IVF - BMET in am  N/V/D: likely viral etiology. Improving - zofran - IVF - Probiotic  HTN: - continuye norvasc,dc  HCTZ given AKI, Imdur - Hold ACEi due to AKI   Dysuria: chronic and intermittent - UA - UCX   DM: Started on glucose stabilizer given no improvement on SSI and lantus  - A1c 10.7  Dependent edema: h/o CHF though last 2014 Echo  nml.EF 60% , repeat ECHO to r/o PUL HTN  dc lasix given IVF   HLD: - continue statin  Sezures: _ continue keppra  GERD: - continue ppi  Depression: - continue welbutrin     DVT prophylaxsis heparin  Code Status:      Code Status Orders        Start     Ordered   05/06/15 1641  Full code   Continuous     05/06/15 1644       Family Communication: Discussed in detail with the patient, all imaging results, lab results explained to the patient   Disposition Plan:  Cont inpatient ,ok to shower      Consultants:  None   Procedures:  None   Antibiotics: Anti-infectives    Start     Dose/Rate Route Frequency Ordered Stop   05/07/15 1600  cefTRIAXone (ROCEPHIN) 1 g in dextrose 5 % 50 mL IVPB     1 g 100 mL/hr over 30 Minutes Intravenous Every 24 hours 05/06/15 1644 05/13/15 1559   05/07/15 1600  azithromycin (ZITHROMAX) tablet 500 mg     500 mg Oral Every 24 hours 05/06/15 1644 05/13/15 1559   05/06/15 1345  cefTRIAXone (ROCEPHIN) 1 g in dextrose 5 % 50 mL IVPB     1 g 100 mL/hr over 30 Minutes Intravenous  Once 05/06/15 1340 05/06/15 1607   05/06/15 1345  azithromycin (ZITHROMAX) 500 mg in dextrose 5 % 250 mL IVPB     500 mg 250 mL/hr  over 60 Minutes Intravenous  Once 05/06/15 1340 05/06/15 1741         HPI/Subjective: Very tired , nauseous , lot of stench in the room  Objective: Filed Vitals:   05/06/15 2231 05/07/15 0523 05/07/15 0929 05/07/15 0935  BP: 129/60 141/77 105/55 122/49  Pulse: 77 70 67 70  Temp: 98 F (36.7 C) 97.5 F (36.4 C)    TempSrc: Oral Oral    Resp: 20 22    Height:      Weight:      SpO2: 95% 95%      Intake/Output Summary (Last 24 hours) at 05/07/15 1256 Last data filed at 05/07/15 [id number]  Gross per 24 hour  Intake    220 ml  Output      0 ml  Net    220 ml    Exam:  General:morbidly obese ,  Lungs: Clear to auscultation bilaterally without wheezes or crackles Cardiovascular: Regular rate and rhythm without  murmur gallop or rub normal S1 and S2 Abdomen: Nontender, nondistended, soft, bowel sounds positive, no rebound, no ascites, no appreciable mass Extremities: No significant cyanosis, clubbing, or edema bilateral lower extremities     Data Review   Micro Results Recent Results (from the past 240 hour(s))  Blood culture (routine x 2)     Status: None (Preliminary result)   Collection Time: 05/06/15  2:12 PM  Result Value Ref Range Status   Specimen Description BLOOD RIGHT ANTECUBITAL  Final   Special Requests BOTTLES DRAWN AEROBIC AND ANAEROBIC 5MLS  Final   Culture NO GROWTH < 24 HOURS  Final   Report Status PENDING  Incomplete  Blood culture (routine x 2)     Status: None (Preliminary result)   Collection Time: 05/06/15  2:15 PM  Result Value Ref Range Status   Specimen Description BLOOD LEFT ANTECUBITAL  Final   Special Requests   Final    BOTTLES DRAWN AEROBIC AND ANAEROBIC 10CCS BLUR 5CC RED   Culture NO GROWTH < 24 HOURS  Final   Report Status PENDING  Incomplete  Urine culture     Status: None (Preliminary result)   Collection Time: 05/06/15  4:16 PM  Result Value Ref Range Status   Specimen Description URINE, RANDOM  Final   Special Requests NONE  Final   Culture TOO YOUNG TO READ  Final   Report Status PENDING  Incomplete    Radiology Reports Dg Chest 2 View  05/05/2015  CLINICAL DATA:  Cough for 2 weeks, chest pain EXAM: CHEST  2 VIEW COMPARISON:  06/18/2014 FINDINGS: Mild peribronchial thickening. Airspace opacity in the left upper lobe concerning for pneumonia. Right lung is clear. Heart is upper limits normal in size. No effusions or acute bony abnormality. IMPRESSION: Mild bronchitic changes. Focal airspace opacity in the left upper lobe concerning for pneumonia. Electronically Signed   By: Rolm Baptise M.D.   On: 05/05/2015 16:36   Dg Abd 1 View  05/06/2015  CLINICAL DATA:  Evaluate for foreign body. Reports a pen needle broke off under skin. EXAM: ABDOMEN - 1  VIEW COMPARISON:  06/18/2014 FINDINGS: Pendulous pannus which is incompletely visualized. No retained needle is identified. Nonobstructive bowel gas pattern with moderate stool volume. No concerning intra-abdominal mass effect or calcification. Extreme inferior lung bases are clear. IMPRESSION: No evidence of retained needle, but the pannus is incompletely visualized. If ongoing concern, a repeat image with skin marker could be obtained. Electronically Signed   By: Neva Seat.D.  On: 05/06/2015 15:41     CBC  Recent Labs Lab 05/05/15 1610 05/06/15 1412 05/07/15 0559  WBC 6.4 5.3 4.9  HGB 15.1* 14.9 14.1  HCT 46.5* 44.3 43.2  PLT 265 250 275  MCV 97.5 97.1 97.3  MCH 31.7 32.7 31.8  MCHC 32.5 33.6 32.6  RDW 13.1 13.4 13.0  LYMPHSABS  --  2.2  --   MONOABS  --  0.5  --   EOSABS  --  0.1  --   BASOSABS  --  0.0  --     Chemistries   Recent Labs Lab 05/05/15 1610 05/06/15 1412 05/07/15 0559  NA 140 137 134*  K 3.7 3.7 4.3  CL 93* 91* 94*  CO2 32 33* 31  GLUCOSE 345* 340* 458*  BUN 16 21* 23*  CREATININE 2.20* 2.14* 2.02*  CALCIUM 8.8* 8.5* 7.9*  AST 20  --   --   ALT 13*  --   --   ALKPHOS 79  --   --   BILITOT 1.2  --   --    ------------------------------------------------------------------------------------------------------------------ estimated creatinine clearance is 40.4 mL/min (by C-G formula based on Cr of 2.02). ------------------------------------------------------------------------------------------------------------------  Recent Labs  05/05/15 1331 05/06/15 1712  HGBA1C 107.7 10.7*   ------------------------------------------------------------------------------------------------------------------ No results for input(s): CHOL, HDL, LDLCALC, TRIG, CHOLHDL, LDLDIRECT in the last 72 hours. ------------------------------------------------------------------------------------------------------------------ No results for input(s): TSH, T4TOTAL,  T3FREE, THYROIDAB in the last 72 hours.  Invalid input(s): FREET3 ------------------------------------------------------------------------------------------------------------------ No results for input(s): VITAMINB12, FOLATE, FERRITIN, TIBC, IRON, RETICCTPCT in the last 72 hours.  Coagulation profile No results for input(s): INR, PROTIME in the last 168 hours.  No results for input(s): DDIMER in the last 72 hours.  Cardiac Enzymes No results for input(s): CKMB, TROPONINI, MYOGLOBIN in the last 168 hours.  Invalid input(s): CK ------------------------------------------------------------------------------------------------------------------ Invalid input(s): POCBNP   CBG:  Recent Labs Lab 05/07/15 0650 05/07/15 0751 05/07/15 1007 05/07/15 1107 05/07/15 1211  GLUCAP 392* 405* 434* 445* 414*       Studies: Dg Chest 2 View  05/05/2015  CLINICAL DATA:  Cough for 2 weeks, chest pain EXAM: CHEST  2 VIEW COMPARISON:  06/18/2014 FINDINGS: Mild peribronchial thickening. Airspace opacity in the left upper lobe concerning for pneumonia. Right lung is clear. Heart is upper limits normal in size. No effusions or acute bony abnormality. IMPRESSION: Mild bronchitic changes. Focal airspace opacity in the left upper lobe concerning for pneumonia. Electronically Signed   By: Rolm Baptise M.D.   On: 05/05/2015 16:36   Dg Abd 1 View  05/06/2015  CLINICAL DATA:  Evaluate for foreign body. Reports a pen needle broke off under skin. EXAM: ABDOMEN - 1 VIEW COMPARISON:  06/18/2014 FINDINGS: Pendulous pannus which is incompletely visualized. No retained needle is identified. Nonobstructive bowel gas pattern with moderate stool volume. No concerning intra-abdominal mass effect or calcification. Extreme inferior lung bases are clear. IMPRESSION: No evidence of retained needle, but the pannus is incompletely visualized. If ongoing concern, a repeat image with skin marker could be obtained. Electronically  Signed   By: Monte Fantasia M.D.   On: 05/06/2015 15:41      Lab Results  Component Value Date   HGBA1C 10.7* 05/06/2015   HGBA1C 107.7 05/05/2015   HGBA1C 8.9 02/17/2015   Lab Results  Component Value Date   MICROALBUR 4.5* 11/08/2014   LDLCALC 72 07/05/2014   CREATININE 2.02* 05/07/2015       Scheduled Meds: . acidophilus  1 capsule Oral Daily  .  aspirin EC  81 mg Oral Daily  . azithromycin  500 mg Oral Q24H  . budesonide-formoterol  2 puff Inhalation BID  . buPROPion  300 mg Oral Daily  . cefTRIAXone (ROCEPHIN)  IV  1 g Intravenous Q24H  . heparin  5,000 Units Subcutaneous 3 times per day  . insulin aspart  0-5 Units Subcutaneous QHS  . insulin regular  0-10 Units Intravenous TID WC  . isosorbide mononitrate  120 mg Oral Daily  . levETIRAcetam  750 mg Oral BID  . pantoprazole  80 mg Oral Daily  . potassium chloride SA  20 mEq Oral Daily  . simvastatin  20 mg Oral QHS   Continuous Infusions: . sodium chloride 100 mL/hr at 05/07/15 0850  . dextrose 5 % and 0.45% NaCl    . insulin (NOVOLIN-R) infusion 10.6 Units/hr (05/07/15 1215)    Active Problems:   CAP (community acquired pneumonia)   AKI (acute kidney injury) ([place])   Nausea vomiting and diarrhea   Essential hypertension   Diabetes mellitus with complication ([facility])   Dependent edema   HLD (hyperlipidemia)    Time spent: 69 minutes   [facility] Pager [phone]. If 7PM-7AM, please contact night-coverage at [email or web address], password [facility] 05/07/2015, 12:56 PM

## 2015-05-07 NOTE — Telephone Encounter (Signed)
Attempted to reach the pt's son. Phone number was busy. Will try again at a later time.

## 2015-05-07 NOTE — Progress Notes (Signed)
Results for SHARLETTA, BOLAN (MRN [id number]) as of 05/07/2015 10:22  Ref. Range 05/07/2015 04:11 05/07/2015 05:59 05/07/2015 06:50 05/07/2015 07:51 05/07/2015 10:07  Glucose-Capillary Latest Ref Range: 65-99 mg/dL 443 (H)  392 (H) 405 (H) 434 (H)  Noted that blood sugars elevated on admission. According to history, patient takes 75/25 insulin 380 units daily at home.  When transitioning off insulin drip, recommend starting Lantus 40 units BID (0.5U/kg), Novolog RESISTANT correction scale TID & HS, Novolog 10 units TID meal coverage if eating at least 50% of meals. May need to titrate insulin dosage if blood sugars continue to be elevated, greater than 180 mg/dl. Will continue to monitor blood sugars while in the hospital. Harvel Ricks RN BSN CDE

## 2015-05-07 NOTE — Progress Notes (Signed)
Pt CBG 465 MD on call Baltazar Najjar was paged gave an order to give novolog 15u iv AT 0202  and lentus 10u subcut at 0250,  CBG 443, MD on call Baltazar Najjar is paged again about pt CBG LEVEL, i am waiting for her response will continue to monitor

## 2015-05-07 NOTE — Progress Notes (Signed)
Pt BP 105/55. Recheck BP 122/49. Holding Amlodipine for now. MD paged.

## 2015-05-07 NOTE — Progress Notes (Signed)
Pt CBG 392 MD on call [place], wants RN to call attending provider with the BNP RESULT will pass the information on to the RN

## 2015-05-07 NOTE — Progress Notes (Signed)
MD CALLED BACK WITH AN ORDER OF STAT BNP want RN to call with the BNP result waiting for lab to do the test, will continue to monitor

## 2015-05-07 NOTE — Progress Notes (Signed)
Spoke with patient about her diabetes. Patient takes 75/25 mixed insulin 380 units per day at home and sometimes takes more if blood sugars are higher. States that she checks her blood sugars 3 times/day. Saw Dr. Loanne Drilling on 05/06/15 and was sent to the hospital with elevated blood sugars. Is now on glucostabilizer. States that she lives with her daughter, but she is gone from home a lot. Sometimes she runs out of insulin and her son has to get her insulin from [facility] when he gets off work at night.  Not sure that she fully understands how her insulin works and does not have food to eat unless brought to her or daughter makes it. Not sure that she is taking her insulin every day.  May need home care. HgbA1C is 10.7%. Will continue to follow while in hospital. Harvel Ricks RN BSN CDE

## 2015-05-07 NOTE — Care Management Note (Addendum)
Case Management Note  Patient Details  Name: [name] MRN: [id number] Date of Birth: November 08, 1940  Subjective/Objective:   Present with nausea and sob. Lives with daughter and son in law. Daughter assist pt with ADL's. DME: walker, hoover round(wc),regesting BSC.    Action/Plan: Plan to return to home when medically stable. CM to f/u with disposition needs.  Expected Discharge Date:                  Expected Discharge Plan:  Home/Self Care  In-House Referral:     Discharge planning Services  CM Consult  Post Acute Care Choice:    Choice offered to:     DME Arranged:    DME Agency:     HH Arranged:    HH Agency:     Status of Service:  In process, will continue to follow  Medicare Important Message Given:    Date Medicare IM Given:    Medicare IM give by:    Date Additional Medicare IM Given:    Additional Medicare Important Message give by:     If discussed at [place] of Stay Meetings, dates discussed:    Additional Comments: PCP: Renato Shin, MD   [name] (Daughter)[phone], [name] (son)  [phone]  [name], [place] [phone] 05/07/2015, 10:43 AM

## 2015-05-08 ENCOUNTER — Inpatient Hospital Stay (HOSPITAL_COMMUNITY): Payer: Medicare Other

## 2015-05-08 ENCOUNTER — Other Ambulatory Visit: Payer: Self-pay | Admitting: Endocrinology

## 2015-05-08 DIAGNOSIS — R0600 Dyspnea, unspecified: Secondary | ICD-10-CM

## 2015-05-08 LAB — GLUCOSE, CAPILLARY
GLUCOSE-CAPILLARY: 152 mg/dL — AB (ref 65–99)
GLUCOSE-CAPILLARY: 138 mg/dL — AB (ref 65–99)
GLUCOSE-CAPILLARY: 159 mg/dL — AB (ref 65–99)
GLUCOSE-CAPILLARY: 189 mg/dL — AB (ref 65–99)
GLUCOSE-CAPILLARY: 231 mg/dL — AB (ref 65–99)
GLUCOSE-CAPILLARY: 280 mg/dL — AB (ref 65–99)
Glucose-Capillary: 126 mg/dL — ABNORMAL HIGH (ref 65–99)
Glucose-Capillary: 138 mg/dL — ABNORMAL HIGH (ref 65–99)
Glucose-Capillary: 155 mg/dL — ABNORMAL HIGH (ref 65–99)
Glucose-Capillary: 185 mg/dL — ABNORMAL HIGH (ref 65–99)
Glucose-Capillary: 278 mg/dL — ABNORMAL HIGH (ref 65–99)

## 2015-05-08 LAB — COMPREHENSIVE METABOLIC PANEL
ALBUMIN: 2.7 g/dL — AB (ref 3.5–5.0)
ALT: 14 U/L (ref 14–54)
ANION GAP: 7 (ref 5–15)
AST: 14 U/L — ABNORMAL LOW (ref 15–41)
Alkaline Phosphatase: 56 U/L (ref 38–126)
BUN: 27 mg/dL — ABNORMAL HIGH (ref 6–20)
CO2: 30 mmol/L (ref 22–32)
Calcium: 7.8 mg/dL — ABNORMAL LOW (ref 8.9–10.3)
Chloride: 100 mmol/L — ABNORMAL LOW (ref 101–111)
Creatinine, Ser: 1.75 mg/dL — ABNORMAL HIGH (ref 0.44–1.00)
GFR calc non Af Amer: 28 mL/min — ABNORMAL LOW (ref >60)
GFR, EST AFRICAN AMERICAN: 32 mL/min — AB (ref >60)
GLUCOSE: 136 mg/dL — AB (ref 65–99)
POTASSIUM: 3.6 mmol/L (ref 3.5–5.1)
SODIUM: 137 mmol/L (ref 135–145)
Total Bilirubin: 0.3 mg/dL (ref 0.3–1.2)
Total Protein: 6 g/dL — ABNORMAL LOW (ref 6.5–8.1)

## 2015-05-08 LAB — URINE CULTURE

## 2015-05-08 LAB — CBC
HCT: 37.4 % (ref 36.0–46.0)
HEMOGLOBIN: 12 g/dL (ref 12.0–15.0)
MCH: 31.4 pg (ref 26.0–34.0)
MCHC: 32.1 g/dL (ref 30.0–36.0)
MCV: 97.9 fL (ref 78.0–100.0)
Platelets: 235 10*3/uL (ref 150–400)
RBC: 3.82 MIL/uL — AB (ref 3.87–5.11)
RDW: 13.2 % (ref 11.5–15.5)
WBC: 7.5 10*3/uL (ref 4.0–10.5)

## 2015-05-08 LAB — URINALYSIS, ROUTINE W REFLEX MICROSCOPIC
Bilirubin Urine: NEGATIVE
Glucose, UA: NEGATIVE mg/dL
HGB URINE DIPSTICK: NEGATIVE
Ketones, ur: NEGATIVE mg/dL
Leukocytes, UA: NEGATIVE
NITRITE: NEGATIVE
PROTEIN: NEGATIVE mg/dL
SPECIFIC GRAVITY, URINE: 1.006 (ref 1.005–1.030)
pH: 5.5 (ref 5.0–8.0)

## 2015-05-08 LAB — PROCALCITONIN

## 2015-05-08 MED ORDER — INSULIN ASPART 100 UNIT/ML ~~LOC~~ SOLN
0.0000 [IU] | Freq: Every day | SUBCUTANEOUS | Status: DC
  Administered 2015-05-08: 3 [IU] via SUBCUTANEOUS
  Administered 2015-05-09 – 2015-05-11 (×3): 2 [IU] via SUBCUTANEOUS

## 2015-05-08 MED ORDER — INSULIN ASPART 100 UNIT/ML ~~LOC~~ SOLN
10.0000 [IU] | Freq: Three times a day (TID) | SUBCUTANEOUS | Status: DC
  Administered 2015-05-08 – 2015-05-11 (×11): 10 [IU] via SUBCUTANEOUS

## 2015-05-08 MED ORDER — INSULIN GLARGINE 100 UNIT/ML ~~LOC~~ SOLN
40.0000 [IU] | Freq: Every day | SUBCUTANEOUS | Status: DC
  Administered 2015-05-08 – 2015-05-09 (×3): 40 [IU] via SUBCUTANEOUS
  Filled 2015-05-08 (×4): qty 0.4

## 2015-05-08 MED ORDER — INSULIN ASPART 100 UNIT/ML ~~LOC~~ SOLN
0.0000 [IU] | Freq: Three times a day (TID) | SUBCUTANEOUS | Status: DC
  Administered 2015-05-08: 11 [IU] via SUBCUTANEOUS
  Administered 2015-05-08: 7 [IU] via SUBCUTANEOUS
  Administered 2015-05-08: 4 [IU] via SUBCUTANEOUS
  Administered 2015-05-09 (×2): 7 [IU] via SUBCUTANEOUS
  Administered 2015-05-09 – 2015-05-10 (×2): 4 [IU] via SUBCUTANEOUS
  Administered 2015-05-10: 14 [IU] via SUBCUTANEOUS
  Administered 2015-05-11 (×2): 7 [IU] via SUBCUTANEOUS

## 2015-05-08 NOTE — Evaluation (Signed)
Physical Therapy Evaluation Patient Details Name: [name] MRN: [id number] DOB: 07-May-1940 Today's Date: 05/08/2015   History of Present Illness  Pt adm with PNA and acute kidney injury. PMH - CVA, DM, CHF, COPD, obesity  Clinical Impression  Pt admitted with above diagnosis and presents to PT with functional limitations due to deficits listed below (See PT problem list). Pt needs skilled PT to maximize independence and safety to allow discharge to ST-SNF since pt at home alone at times.      Follow Up Recommendations SNF    Equipment Recommendations  None recommended by PT    Recommendations for Other Services       Precautions / Restrictions Precautions Precautions: Fall Restrictions Weight Bearing Restrictions: No      Mobility  Bed Mobility Overal bed mobility: Needs Assistance Bed Mobility: Sit to Supine     Supine to sit: Min guard;HOB elevated Sit to supine: Min assist   General bed mobility comments: Assist to bring legs back up into bed  Transfers Overall transfer level: Needs assistance Equipment used: None Transfers: Sit to/from [facility] Sit to Stand: +2 physical assistance;Min assist Stand pivot transfers: +2 physical assistance;Min assist       General transfer comment: Assist to bring hips up and for balance  Ambulation/Gait                Stairs            Wheelchair Mobility    Modified Rankin (Stroke Patients Only)       Balance Overall balance assessment: Needs assistance Sitting-balance support: No upper extremity supported;Feet supported Sitting balance-Leahy Scale: Good     Standing balance support: No upper extremity supported Standing balance-Leahy Scale: Poor Standing balance comment: min A for static standing.                             Pertinent Vitals/Pain Pain Assessment: No/denies pain    Home Living Family/patient expects to be discharged to:: Private  residence Living Arrangements: Alone Available Help at Discharge: Family;Available PRN/intermittently ("daughter comes over sometimes") Type of Home: Apartment Home Access: Level entry     Home Layout: One level Home Equipment: Hand held shower head;Bedside commode;Shower seat;Walker - 2 wheels;Wheelchair - power;Cane - single point;Grab bars - tub/shower Additional Comments: Pt reports she no longer has an aide, daughter assists prn    Prior Function Level of Independence: Needs assistance   Gait / Transfers Assistance Needed: Modified independent for transfers. Doesn't amb due to frequent falls with amb  ADL's / Homemaking Assistance Needed: Per pt, daughter assists with ADLs; pt reports she requires total assist for ADLs due to 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 Uses BSC beside bed/chair, states she does not walk to BR due to lots of falls   Comments: Unsure of reliability of above. Some was from previous entries. Prior function is from what patient has stated this visit.      Hand Dominance   Dominant Hand: Right    Extremity/Trunk Assessment   Upper Extremity Assessment: Defer to OT evaluation           Lower Extremity Assessment: Generalized weakness      Cervical / Trunk Assessment: Kyphotic  Communication   Communication: No difficulties  Cognition Arousal/Alertness: Lethargic Behavior During Therapy: WFL for tasks assessed/performed Overall Cognitive Status: No family/caregiver present to determine baseline cognitive functioning       Memory:  Decreased short-term memory              General Comments General comments (skin integrity, edema, etc.): Pt reports she does not walk much secondary to multiple falls when she does    Exercises        Assessment/Plan    PT Assessment Patient needs continued PT services  PT Diagnosis Generalized weakness;Difficulty walking   PT Problem List Decreased strength;Decreased activity tolerance;Decreased  balance;Decreased mobility;Decreased knowledge of use of DME;Obesity  PT Treatment Interventions DME instruction;Functional mobility training;Therapeutic activities;Therapeutic exercise;Balance training;Patient/family education   PT Goals (Current goals can be found in the Care Plan section) Acute Rehab PT Goals Patient Stated Goal: none stated PT Goal Formulation: With patient Time For Goal Achievement: 05/22/15 Potential to Achieve Goals: Good    Frequency Min 2X/week   Barriers to discharge Decreased caregiver support Lives alone with intermittent support and history of falls    Co-evaluation               End of Session Equipment Utilized During Treatment:  (Too large for gait belt) Activity Tolerance: Patient limited by fatigue;Patient limited by lethargy Patient left: in bed;with call bell/phone within reach;with bed alarm set           Time: [id number] PT Time Calculation (min) (ACUTE ONLY): 11 min   Charges:   PT Evaluation $PT Eval Moderate Complexity: 1 Procedure     PT G Codes:        Sheralee Qazi 2015/05/13, 4:09 PM [facility] PT [phone]

## 2015-05-08 NOTE — Progress Notes (Signed)
[facility] PROGRESS NOTE  [name] [id number] DOB: [birth year] DOA: 05/06/2015 PCP: Kylie Shin, MD  Length of stay: 1   Assessment/Plan: Active Problems:   CAP (community acquired pneumonia)   AKI (acute kidney injury) ([place])   Nausea vomiting and diarrhea   Essential hypertension   Diabetes mellitus with complication ([place])   Dependent edema   HLD (hyperlipidemia)    75 y.o. female presents with 2 weeks of ongoing cough, nausea, vomiting, loss of appetite and has had decreased urine output over the last few days. He came to emergency department last night and left without being seen after triage. Chest x-ray at that time showed focal consolidation suspicious for pneumonia  . At that time she was also found to have an acute kidney injury but left prior to evaluation. She returns today for her evaluation, she is currently wheezing and has some increased effort but no evidence of hypoxemia. Has history of COPD and CHF documented but notably has preserved ejection fraction. Given IV fluids and Rocephin/azithromycin for community-acquired pneumonia coverage. Nebulizer treatments started for wheezing and Solu-Medrol administered for reactive airway component. Hospitalist was consulted for admission     Assessment and plan  CAP: CXR and symptoms suggestive of CAP. Lactic acid 1.7, trop nml, WBC 5.3, on RA.  -place on tele  - cont CTX, Azithro, day 3 - mucinex - DUonebs - DC Steroids due to ucontrolled  cbg's  - procalcitonin <0.10 Blood  Cx NGSF     AKI: Cr. 2.14, basleine 0.9. Suspect dehydration, improving  - cont IVF - BMET in am  N/V/D: likely viral etiology. Improving - zofran - IVF - Probiotic   HTN: Hold  Norvasc,   HCTZ given AKI, cont Imdur - Hold ACEi due to AKI   Dysuria: chronic and intermittent - UA negative  - UCX NGSF   DM: Was  on glucose stabilizer given no improvement on SSI and lantus , now back on SQ insulin, CBG  stable  - A1c 10.7 Patient takes 75/25 mixed insulin 380 units per day at home and sometimes takes more if blood sugars are higher. States that she checks her blood sugars 3 times/day. Saw [name] on 05/06/15  Will request DM coordinator to clarify if patient needs to transition back to her home regimen, prefer for her to stay on the current regimen as this seems to be working    Dependent edema: h/o CHF though last 2014 Echo nml.EF 60% , repeat ECHO to r/o PUL HTN  Hold  lasix given IVF   HLD: - continue statin  Sezures: _ continue keppra  GERD: - continue ppi  Depression: - continue welbutrin    DVT prophylaxsis heparin  Code Status:      Code Status Orders        Start     Ordered   05/06/15 1641  Full code   Continuous     05/06/15 1644       Family Communication: Discussed in detail with the patient, all imaging results, lab results explained to the patient   Disposition Plan:  Cont inpatient ,ok to shower , PT eval      Consultants:  None   Procedures:  None   Antibiotics: Anti-infectives    Start     Dose/Rate Route Frequency Ordered Stop   05/07/15 1600  cefTRIAXone (ROCEPHIN) 1 g in dextrose 5 % 50 mL IVPB     1 g 100 mL/hr over  30 Minutes Intravenous Every 24 hours 05/06/15 1644 05/13/15 1559   05/07/15 1600  azithromycin (ZITHROMAX) tablet 500 mg     500 mg Oral Every 24 hours 05/06/15 1644 05/13/15 1559   05/06/15 1345  cefTRIAXone (ROCEPHIN) 1 g in dextrose 5 % 50 mL IVPB     1 g 100 mL/hr over 30 Minutes Intravenous  Once 05/06/15 1340 05/06/15 1607   05/06/15 1345  azithromycin (ZITHROMAX) 500 mg in dextrose 5 % 250 mL IVPB     500 mg 250 mL/hr over 60 Minutes Intravenous  Once 05/06/15 1340 05/06/15 1741         HPI/Subjective: Breathing better , afebrile   Objective: Filed Vitals:   05/08/15 0024 05/08/15 0548 05/08/15 0732 05/08/15 0958  BP: 126/67 115/52  114/55  Pulse: 63 57  71  Temp:  97.8 F (36.6 C)     TempSrc:  Oral    Resp:  20    Height:      Weight:      SpO2:  96% 98%     Intake/Output Summary (Last 24 hours) at 05/08/15 1418 Last data filed at 05/08/15 1344  Gross per 24 hour  Intake 3321.54 ml  Output      0 ml  Net 3321.54 ml    Exam:  General:morbidly obese ,  Lungs: Clear to auscultation bilaterally without wheezes or crackles Cardiovascular: Regular rate and rhythm without murmur gallop or rub normal S1 and S2 Abdomen: Nontender, nondistended, soft, bowel sounds positive, no rebound, no ascites, no appreciable mass Extremities: No significant cyanosis, clubbing, or edema bilateral lower extremities     Data Review   Micro Results Recent Results (from the past 240 hour(s))  Blood culture (routine x 2)     Status: None (Preliminary result)   Collection Time: 05/06/15  2:12 PM  Result Value Ref Range Status   Specimen Description BLOOD RIGHT ANTECUBITAL  Final   Special Requests BOTTLES DRAWN AEROBIC AND ANAEROBIC 5MLS  Final   Culture NO GROWTH 2 DAYS  Final   Report Status PENDING  Incomplete  Blood culture (routine x 2)     Status: None (Preliminary result)   Collection Time: 05/06/15  2:15 PM  Result Value Ref Range Status   Specimen Description BLOOD LEFT ANTECUBITAL  Final   Special Requests   Final    BOTTLES DRAWN AEROBIC AND ANAEROBIC 10CCS BLUR 5CC RED   Culture NO GROWTH 2 DAYS  Final   Report Status PENDING  Incomplete  Urine culture     Status: None   Collection Time: 05/06/15  4:16 PM  Result Value Ref Range Status   Specimen Description URINE, RANDOM  Final   Special Requests NONE  Final   Culture MULTIPLE SPECIES PRESENT, SUGGEST RECOLLECTION  Final   Report Status 05/08/2015 FINAL  Final    Radiology Reports Dg Chest 2 View  05/05/2015  CLINICAL DATA:  Cough for 2 weeks, chest pain EXAM: CHEST  2 VIEW COMPARISON:  06/18/2014 FINDINGS: Mild peribronchial thickening. Airspace opacity in the left upper lobe concerning for pneumonia.  Right lung is clear. Heart is upper limits normal in size. No effusions or acute bony abnormality. IMPRESSION: Mild bronchitic changes. Focal airspace opacity in the left upper lobe concerning for pneumonia. Electronically Signed   By: [name] M.D.   On: 05/05/2015 16:36   Dg Abd 1 View  05/06/2015  CLINICAL DATA:  Evaluate for foreign body. Reports a pen needle broke off under  skin. EXAM: ABDOMEN - 1 VIEW COMPARISON:  06/18/2014 FINDINGS: Pendulous pannus which is incompletely visualized. No retained needle is identified. Nonobstructive bowel gas pattern with moderate stool volume. No concerning intra-abdominal mass effect or calcification. Extreme inferior lung bases are clear. IMPRESSION: No evidence of retained needle, but the pannus is incompletely visualized. If ongoing concern, a repeat image with skin marker could be obtained. Electronically Signed   By: Monte Fantasia M.D.   On: 05/06/2015 15:41     CBC  Recent Labs Lab 05/05/15 1610 05/06/15 1412 05/07/15 0559 05/08/15 0454  WBC 6.4 5.3 4.9 7.5  HGB 15.1* 14.9 14.1 12.0  HCT 46.5* 44.3 43.2 37.4  PLT 265 250 275 235  MCV 97.5 97.1 97.3 97.9  MCH 31.7 32.7 31.8 31.4  MCHC 32.5 33.6 32.6 32.1  RDW 13.1 13.4 13.0 13.2  LYMPHSABS  --  2.2  --   --   MONOABS  --  0.5  --   --   EOSABS  --  0.1  --   --   BASOSABS  --  0.0  --   --     Chemistries   Recent Labs Lab 05/05/15 1610 05/06/15 1412 05/07/15 0559 05/08/15 0454  NA 140 137 134* 137  K 3.7 3.7 4.3 3.6  CL 93* 91* 94* 100*  CO2 32 33* 31 30  GLUCOSE 345* 340* 458* 136*  BUN 16 21* 23* 27*  CREATININE 2.20* 2.14* 2.02* 1.75*  CALCIUM 8.8* 8.5* 7.9* 7.8*  AST 20  --   --  14*  ALT 13*  --   --  14  ALKPHOS 79  --   --  56  BILITOT 1.2  --   --  0.3   ------------------------------------------------------------------------------------------------------------------ estimated creatinine clearance is 46.6 mL/min (by C-G formula based on Cr of  1.75). ------------------------------------------------------------------------------------------------------------------  Recent Labs  05/06/15 1712  HGBA1C 10.7*   ------------------------------------------------------------------------------------------------------------------ No results for input(s): CHOL, HDL, LDLCALC, TRIG, CHOLHDL, LDLDIRECT in the last 72 hours. ------------------------------------------------------------------------------------------------------------------ No results for input(s): TSH, T4TOTAL, T3FREE, THYROIDAB in the last 72 hours.  Invalid input(s): FREET3 ------------------------------------------------------------------------------------------------------------------ No results for input(s): VITAMINB12, FOLATE, FERRITIN, TIBC, IRON, RETICCTPCT in the last 72 hours.  Coagulation profile No results for input(s): INR, PROTIME in the last 168 hours.  No results for input(s): DDIMER in the last 72 hours.  Cardiac Enzymes  Recent Labs Lab 05/07/15 1340 05/07/15 2048  TROPONINI <0.03 <0.03   ------------------------------------------------------------------------------------------------------------------ Invalid input(s): POCBNP   CBG:  Recent Labs Lab 05/08/15 0337 05/08/15 0502 05/08/15 0610 05/08/15 0758 05/08/15 1215  GLUCAP 126* 138* 159* 189* 231*       Studies: Dg Abd 1 View  05/06/2015  CLINICAL DATA:  Evaluate for foreign body. Reports a pen needle broke off under skin. EXAM: ABDOMEN - 1 VIEW COMPARISON:  06/18/2014 FINDINGS: Pendulous pannus which is incompletely visualized. No retained needle is identified. Nonobstructive bowel gas pattern with moderate stool volume. No concerning intra-abdominal mass effect or calcification. Extreme inferior lung bases are clear. IMPRESSION: No evidence of retained needle, but the pannus is incompletely visualized. If ongoing concern, a repeat image with skin marker could be obtained.  Electronically Signed   By: Monte Fantasia M.D.   On: 05/06/2015 15:41      Lab Results  Component Value Date   HGBA1C 10.7* 05/06/2015   HGBA1C 107.7 05/05/2015   HGBA1C 8.9 02/17/2015   Lab Results  Component Value Date   MICROALBUR 4.5* 11/08/2014   LDLCALC 72  07/05/2014   CREATININE 1.75* 05/08/2015       Scheduled Meds: . acidophilus  1 capsule Oral Daily  . aspirin EC  81 mg Oral Daily  . azithromycin  500 mg Oral Q24H  . budesonide-formoterol  2 puff Inhalation BID  . buPROPion  300 mg Oral Daily  . cefTRIAXone (ROCEPHIN)  IV  1 g Intravenous Q24H  . heparin  5,000 Units Subcutaneous 3 times per day  . insulin aspart  0-20 Units Subcutaneous TID WC  . insulin aspart  0-5 Units Subcutaneous QHS  . insulin aspart  10 Units Subcutaneous TID WC  . insulin glargine  40 Units Subcutaneous QHS  . isosorbide mononitrate  120 mg Oral Daily  . labetalol  100 mg Oral BID  . levETIRAcetam  750 mg Oral BID  . pantoprazole  80 mg Oral Daily  . potassium chloride SA  20 mEq Oral Daily  . simvastatin  20 mg Oral QHS   Continuous Infusions: . sodium chloride 100 mL/hr at 05/08/15 0454    Active Problems:   CAP (community acquired pneumonia)   AKI (acute kidney injury) ([place])   Nausea vomiting and diarrhea   Essential hypertension   Diabetes mellitus with complication ([facility])   Dependent edema   HLD (hyperlipidemia)    Time spent: 45 minutes   [facility] Pager [phone]. If 7PM-7AM, please contact night-coverage at [email or web address], password [facility] 05/08/2015, 2:18 PM  LOS: 1 day

## 2015-05-08 NOTE — Progress Notes (Signed)
  Echocardiogram 2D Echocardiogram has been performed.  [name] 05/08/2015, 4:33 PM

## 2015-05-08 NOTE — Evaluation (Signed)
Occupational Therapy Evaluation Patient Details Name: [name] MRN: [id number] DOB: December 18, 1940 Today's Date: 05/08/2015    History of Present Illness 75 y.o. female presents with 2 weeks of ongoing cough, nausea, vomiting, loss of appetite and has had decreased urine output over the last few days. She came to emergency department 05/06/15 and left without being seen after triage. Chest x-ray at that time showed focal consolidation suspicious for pneumonia . At that time she was also found to have an acute kidney injury but left prior to evaluation. She returns today for her evaluation, she is currently wheezing and has some increased effort but no evidence of hypoxemia. Has history of COPD and CHF documented   Clinical Impression   Patient presenting with decreased ADL and functional mobility independence secondary to above. Patient overall mod assist for ADLs and mod I for functional stand pivot transfers PTA. Patient currently functioning at an overall max to total assist level for ADLs and min assist for stand pivot transfers. Patient will benefit from acute OT to increase overall independence in the areas of ADLs, functional mobility, and overall safety in order to safely discharge to venue listed below.     Follow Up Recommendations  SNF;Supervision/Assistance - 24 hour    Equipment Recommendations  Other (comment) (TBD next venue of care)    Recommendations for Other Services  None at this time  Precautions / Restrictions Precautions Precautions: Fall Restrictions Weight Bearing Restrictions: No    Mobility Bed Mobility Overal bed mobility: Needs Assistance Bed Mobility: Supine to Sit     Supine to sit: Min guard;HOB elevated     General bed mobility comments: Cues for safety and min guard for safety  Transfers Overall transfer level: Needs assistance Equipment used: None Transfers: Stand Pivot Transfers   Stand pivot transfers: Min assist        General transfer comment: Pt reports performing stand pivot transfers for toielting needs using BSC. Cues for safety and correct technique during transfer.     Balance Overall balance assessment: Needs assistance;History of Falls Sitting-balance support: Feet supported;Bilateral upper extremity supported Sitting balance-Leahy Scale: Good     Standing balance support: No upper extremity supported;During functional activity Standing balance-Leahy Scale: Poor    ADL Overall ADL's : Needs assistance/impaired Eating/Feeding: Sitting;Minimal assistance   Grooming: Minimal assistance;Sitting Grooming Details (indicate cue type and reason): Pt reports she is unable to comb hair secondary to UE weakness, pt reports daughter or granddaughter normally does it for her Upper Body Bathing: Sitting;Moderate assistance   Lower Body Bathing: Maximal assistance;Sit to/from stand   Upper Body Dressing : Moderate assistance;Sitting   Lower Body Dressing: Sit to/from stand;Total assistance   Toilet Transfer: Minimal assistance;Stand-pivot;BSC Toilet Transfer Details (indicate cue type and reason): simulated EOB to recliner transfer. Pt reports she does not normally walk to BR, just uses BSC becuase of multiple falls Toileting- Clothing Manipulation and Hygiene: Maximal assistance;Sit to/from Nurse, children's Details (indicate cue type and reason): did not occur   General ADL Comments: Pt limited by pain, generalized weakness, and body habitus. Unsure of patient's PLOF due to patient poor historian.     Vision Additional Comments: difficult to assess, continue to futher assess           Pertinent Vitals/Pain Pain Assessment: 0-10 Pain Score: 6  Pain Location: chest pains Pain Descriptors / Indicators: Sharp (reports she's been having pains like this off and on for awhile) Pain Intervention(s): Limited activity within  patient's tolerance;Monitored during session     Hand  Dominance Right   Extremity/Trunk Assessment Upper Extremity Assessment Upper Extremity Assessment: Generalized weakness;Difficult to assess due to impaired cognition (pt with increased pain in RUE during testing. Pt reports h/o CVA that causes pain? and limitations)   Lower Extremity Assessment Lower Extremity Assessment: Defer to PT evaluation   Cervical / Trunk Assessment Cervical / Trunk Assessment: Kyphotic   Communication Communication Communication: No difficulties   Cognition Arousal/Alertness: Lethargic Behavior During Therapy: WFL for tasks assessed/performed Overall Cognitive Status: No family/caregiver present to determine baseline cognitive functioning       Memory: Decreased short-term memory             Home Living Family/patient expects to be discharged to:: Private residence Living Arrangements: Alone Available Help at Discharge: Family;Available PRN/intermittently ("daughter comes over sometimes") Type of Home: Apartment Home Access: Level entry     Home Layout: One level     Bathroom Shower/Tub: Tub/shower unit;Curtain Home Equipment: Hand held shower head;Bedside commode;Shower seat;Walker - 2 wheels;Wheelchair - power;Cane - single point;Grab bars - tub/shower   Additional Comments: Pt reports she no longer has an aide, daughter assists prn      Prior Functioning/Environment Level of Independence: Needs assistance  Gait / Transfers Assistance Needed: States she doesn't always use RW for mobility or transfers ADL's / Homemaking Assistance Needed: Per pt, daughter assists with ADLs; pt reports she requires total assist for ADLs due to 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 Uses BSC beside bed/chair, states she does not walk to BR due to lots of falls  Communication / Swallowing Assistance Needed: none,  Comments: Unsure of reliability of above. Some was from previous entries. Prior function is from what patient has stated this visit.     OT  Diagnosis: Generalized weakness;Acute pain;Cognitive deficits   OT Problem List: Decreased strength;Decreased range of motion;Decreased activity tolerance;Impaired balance (sitting and/or standing);Decreased coordination;Decreased safety awareness;Decreased knowledge of use of DME or AE;Decreased knowledge of precautions;Pain   OT Treatment/Interventions: Self-care/ADL training;Therapeutic exercise;DME and/or AE instruction;Energy conservation;Therapeutic activities;Patient/family education;Balance training    OT Goals(Current goals can be found in the care plan section) Acute Rehab OT Goals Patient Stated Goal: none stated OT Goal Formulation: With patient Time For Goal Achievement: 05/22/15 Potential to Achieve Goals: Fair ADL Goals Pt Will Perform Grooming: with modified independence;sitting Pt Will Transfer to Toilet: bedside commode;stand pivot transfer;with modified independence Pt/caregiver will Perform Home Exercise Program: Increased strength;With written HEP provided;Both right and left upper extremity;With minimal assist Additional ADL Goal #1: Pt will be mod I with bed mobility as a precursor for ADLs and functional mobility   OT Frequency: Min 2X/week   Barriers to D/C: Unsure, pt reports her daughter and granddaughter assist. Pt reports she was practically total assist for ADLs?   End of Session Nurse Communication: Other (comment) (left pt off supplemental 02)  Activity Tolerance: Patient tolerated treatment well Patient left: in chair;with call bell/phone within reach;with chair alarm set   Time: [id number] OT Time Calculation (min): 24 min Charges:  OT General Charges $OT Visit: 1 Procedure OT Evaluation $OT Eval Moderate Complexity: 1 Procedure OT Treatments $Self Care/Home Management : 8-22 mins  Chrys Racer , MS, OTR/L, CLT Pager: [phone]   05/08/2015, 1:07 PM

## 2015-05-09 ENCOUNTER — Other Ambulatory Visit: Payer: Self-pay

## 2015-05-09 LAB — CBC
HEMATOCRIT: 37.6 % (ref 36.0–46.0)
HEMOGLOBIN: 11.6 g/dL — AB (ref 12.0–15.0)
MCH: 30.4 pg (ref 26.0–34.0)
MCHC: 30.9 g/dL (ref 30.0–36.0)
MCV: 98.4 fL (ref 78.0–100.0)
Platelets: 218 10*3/uL (ref 150–400)
RBC: 3.82 MIL/uL — AB (ref 3.87–5.11)
RDW: 13.3 % (ref 11.5–15.5)
WBC: 4 10*3/uL (ref 4.0–10.5)

## 2015-05-09 LAB — EXPECTORATED SPUTUM ASSESSMENT W GRAM STAIN, RFLX TO RESP C

## 2015-05-09 LAB — COMPREHENSIVE METABOLIC PANEL
ALBUMIN: 2.6 g/dL — AB (ref 3.5–5.0)
ALK PHOS: 55 U/L (ref 38–126)
ALT: 13 U/L — ABNORMAL LOW (ref 14–54)
ANION GAP: 5 (ref 5–15)
AST: 13 U/L — ABNORMAL LOW (ref 15–41)
BUN: 19 mg/dL (ref 6–20)
CALCIUM: 7.9 mg/dL — AB (ref 8.9–10.3)
CO2: 30 mmol/L (ref 22–32)
Chloride: 106 mmol/L (ref 101–111)
Creatinine, Ser: 1.37 mg/dL — ABNORMAL HIGH (ref 0.44–1.00)
GFR calc non Af Amer: 37 mL/min — ABNORMAL LOW (ref >60)
GFR, EST AFRICAN AMERICAN: 43 mL/min — AB (ref >60)
GLUCOSE: 191 mg/dL — AB (ref 65–99)
POTASSIUM: 4 mmol/L (ref 3.5–5.1)
SODIUM: 141 mmol/L (ref 135–145)
Total Bilirubin: 0.6 mg/dL (ref 0.3–1.2)
Total Protein: 5.8 g/dL — ABNORMAL LOW (ref 6.5–8.1)

## 2015-05-09 LAB — EXPECTORATED SPUTUM ASSESSMENT W REFEX TO RESP CULTURE

## 2015-05-09 LAB — GLUCOSE, CAPILLARY
GLUCOSE-CAPILLARY: 179 mg/dL — AB (ref 65–99)
GLUCOSE-CAPILLARY: 236 mg/dL — AB (ref 65–99)
Glucose-Capillary: 247 mg/dL — ABNORMAL HIGH (ref 65–99)
Glucose-Capillary: 225 mg/dL — ABNORMAL HIGH (ref 65–99)

## 2015-05-09 NOTE — Care Management Important Message (Signed)
Important Message  Patient Details  Name: [name] MRN: [id number] Date of Birth: [birth year]   Medicare Important Message Given:  Yes    Nathen May 05/09/2015, 11:47 AM

## 2015-05-09 NOTE — Progress Notes (Signed)
[facility] PROGRESS NOTE  [name] [id number] DOB: 1940-08-18 DOA: 05/06/2015 PCP: Renato Shin, MD  Length of stay: 2   Assessment/Plan: Active Problems:   CAP (community acquired pneumonia)   AKI (acute kidney injury) ([place])   Nausea vomiting and diarrhea   Essential hypertension   Diabetes mellitus with complication ([name])   Dependent edema   HLD (hyperlipidemia)   Brief summary 75 y.o. female presents with 2 weeks of ongoing cough, nausea, vomiting, loss of appetite and has had decreased urine output over the last few days. He came to emergency department last night and left without being seen after triage. Chest x-ray at that time showed focal consolidation suspicious for pneumonia  . At that time she was also found to have an acute kidney injury but left prior to evaluation. She returns today for her evaluation, she is currently wheezing and has some increased effort but no evidence of hypoxemia. Has history of COPD and CHF documented but notably has preserved ejection fraction. Given IV fluids and Rocephin/azithromycin for community-acquired pneumonia coverage. Nebulizer treatments started for wheezing and Solu-Medrol administered for reactive airway component. Hospitalist was consulted for admission     Assessment and plan  CAP: CXR and symptoms suggestive of CAP. Lactic acid 1.7, trop nml, WBC 5.3, on RA.  -place on tele  - cont CTX, Azithro, day 4 - mucinex - DUonebs - DC Steroids due to ucontrolled  cbg's  - procalcitonin <0.10 Blood  Cx NGSF , improving currently 95% on room air    AKI: Cr. 2.14> 1.37, basleine 0.9. Suspect dehydration, improving  - cont IVF - BMET in am   N/V/D: likely viral etiology. Improving - zofran - IVF - Probiotic   HTN: Hold  Norvasc, ACE inhibitor,  HCTZ given AKI, cont Imdur     Dysuria: chronic and intermittent - UA negative  - UCX NGSF   DM: Was  on glucose stabilizer given no improvement  on SSI and lantus,   now back on SQ insulin, CBG stable  - A1c 10.7 Patient takes 75/25 mixed insulin 380 units per day at home and sometimes takes more if blood sugars are higher. States that she checks her blood sugars 3 times/day. Saw Dr. Loanne Drilling on 05/06/15  Will request DM coordinator to clarify if patient needs to transition back to her home regimen, prefer for her to stay on the current regimen as this seems to be working  Increased dose of Lantus today   Dependent edema: h/o CHF though last 2014 Echo nml.EF 60% , repeat echo shows EF of 55-60% with grade 1 diastolic dysfunction and mild tricuspid regurgitation Hold IV fluids, resume Lasix when renal function has improved and is stable   HLD: - continue statin  Sezures: _ continue keppra  GERD: - continue ppi  Depression: - continue welbutrin    DVT prophylaxsis heparin  Code Status:      Code Status Orders        Start     Ordered   05/06/15 1641  Full code   Continuous     05/06/15 1644       Family Communication: Discussed in detail with the patient, all imaging results, lab results explained to the patient   Disposition Plan:  Cont inpatient ,ok to shower , PT eval      Consultants:  None   Procedures:  None   Antibiotics: Anti-infectives    Start     Dose/Rate Route  Frequency Ordered Stop   05/07/15 1600  cefTRIAXone (ROCEPHIN) 1 g in dextrose 5 % 50 mL IVPB     1 g 100 mL/hr over 30 Minutes Intravenous Every 24 hours 05/06/15 1644 05/13/15 1559   05/07/15 1600  azithromycin (ZITHROMAX) tablet 500 mg     500 mg Oral Every 24 hours 05/06/15 1644 05/13/15 1559   05/06/15 1345  cefTRIAXone (ROCEPHIN) 1 g in dextrose 5 % 50 mL IVPB     1 g 100 mL/hr over 30 Minutes Intravenous  Once 05/06/15 1340 05/06/15 1607   05/06/15 1345  azithromycin (ZITHROMAX) 500 mg in dextrose 5 % 250 mL IVPB     500 mg 250 mL/hr over 60 Minutes Intravenous  Once 05/06/15 1340 05/06/15 1741          HPI/Subjective: Breathing better , afebrile   Objective: Filed Vitals:   05/08/15 2209 05/08/15 2250 05/09/15 0756 05/09/15 1414  BP: 106/58  130/66 111/52  Pulse: 64 67 62 63  Temp: 97.8 F (36.6 C)  98.4 F (36.9 C) 97.4 F (36.3 C)  TempSrc: Oral  Oral Oral  Resp: 18 18 24 18   Height:      Weight:      SpO2: 94% 96% 94% 95%    Intake/Output Summary (Last 24 hours) at 05/09/15 1453 Last data filed at 05/09/15 1413  Gross per 24 hour  Intake 3106.67 ml  Output      0 ml  Net 3106.67 ml    Exam:  General:morbidly obese ,  Lungs: Clear to auscultation bilaterally without wheezes or crackles Cardiovascular: Regular rate and rhythm without murmur gallop or rub normal S1 and S2 Abdomen: Nontender, nondistended, soft, bowel sounds positive, no rebound, no ascites, no appreciable mass Extremities: No significant cyanosis, clubbing, or edema bilateral lower extremities     Data Review   Micro Results Recent Results (from the past 240 hour(s))  Blood culture (routine x 2)     Status: None (Preliminary result)   Collection Time: 05/06/15  2:12 PM  Result Value Ref Range Status   Specimen Description BLOOD RIGHT ANTECUBITAL  Final   Special Requests BOTTLES DRAWN AEROBIC AND ANAEROBIC 5MLS  Final   Culture NO GROWTH 3 DAYS  Final   Report Status PENDING  Incomplete  Blood culture (routine x 2)     Status: None (Preliminary result)   Collection Time: 05/06/15  2:15 PM  Result Value Ref Range Status   Specimen Description BLOOD LEFT ANTECUBITAL  Final   Special Requests   Final    BOTTLES DRAWN AEROBIC AND ANAEROBIC 10CCS BLUR 5CC RED   Culture NO GROWTH 3 DAYS  Final   Report Status PENDING  Incomplete  Urine culture     Status: None   Collection Time: 05/06/15  4:16 PM  Result Value Ref Range Status   Specimen Description URINE, RANDOM  Final   Special Requests NONE  Final   Culture MULTIPLE SPECIES PRESENT, SUGGEST RECOLLECTION  Final   Report Status  05/08/2015 FINAL  Final  Culture, sputum-assessment     Status: None   Collection Time: 05/09/15  6:54 AM  Result Value Ref Range Status   Specimen Description SPUTUM  Final   Special Requests NONE  Final   Sputum evaluation   Final    MICROSCOPIC FINDINGS SUGGEST THAT THIS SPECIMEN IS NOT REPRESENTATIVE OF LOWER RESPIRATORY SECRETIONS. PLEASE RECOLLECT. Gram Stain Report Called to,Read Back By and Verified With: AZenon Mayo AT [id number] ON [id number]  BY Rhea Bleacher    Report Status 05/09/2015 FINAL  Final    Radiology Reports Dg Chest 2 View  05/05/2015  CLINICAL DATA:  Cough for 2 weeks, chest pain EXAM: CHEST  2 VIEW COMPARISON:  06/18/2014 FINDINGS: Mild peribronchial thickening. Airspace opacity in the left upper lobe concerning for pneumonia. Right lung is clear. Heart is upper limits normal in size. No effusions or acute bony abnormality. IMPRESSION: Mild bronchitic changes. Focal airspace opacity in the left upper lobe concerning for pneumonia. Electronically Signed   By: Rolm Baptise M.D.   On: 05/05/2015 16:36   Dg Abd 1 View  05/06/2015  CLINICAL DATA:  Evaluate for foreign body. Reports a pen needle broke off under skin. EXAM: ABDOMEN - 1 VIEW COMPARISON:  06/18/2014 FINDINGS: Pendulous pannus which is incompletely visualized. No retained needle is identified. Nonobstructive bowel gas pattern with moderate stool volume. No concerning intra-abdominal mass effect or calcification. Extreme inferior lung bases are clear. IMPRESSION: No evidence of retained needle, but the pannus is incompletely visualized. If ongoing concern, a repeat image with skin marker could be obtained. Electronically Signed   By: Monte Fantasia M.D.   On: 05/06/2015 15:41     CBC  Recent Labs Lab 05/05/15 1610 05/06/15 1412 05/07/15 0559 05/08/15 0454 05/09/15 0536  WBC 6.4 5.3 4.9 7.5 4.0  HGB 15.1* 14.9 14.1 12.0 11.6*  HCT 46.5* 44.3 43.2 37.4 37.6  PLT 265 250 275 235 218  MCV 97.5 97.1 97.3 97.9 98.4   MCH 31.7 32.7 31.8 31.4 30.4  MCHC 32.5 33.6 32.6 32.1 30.9  RDW 13.1 13.4 13.0 13.2 13.3  LYMPHSABS  --  2.2  --   --   --   MONOABS  --  0.5  --   --   --   EOSABS  --  0.1  --   --   --   BASOSABS  --  0.0  --   --   --     Chemistries   Recent Labs Lab 05/05/15 1610 05/06/15 1412 05/07/15 0559 05/08/15 0454 05/09/15 0536  NA 140 137 134* 137 141  K 3.7 3.7 4.3 3.6 4.0  CL 93* 91* 94* 100* 106  CO2 32 33* 31 30 30   GLUCOSE 345* 340* 458* 136* 191*  BUN 16 21* 23* 27* 19  CREATININE 2.20* 2.14* 2.02* 1.75* 1.37*  CALCIUM 8.8* 8.5* 7.9* 7.8* 7.9*  AST 20  --   --  14* 13*  ALT 13*  --   --  14 13*  ALKPHOS 79  --   --  56 55  BILITOT 1.2  --   --  0.3 0.6   ------------------------------------------------------------------------------------------------------------------ estimated creatinine clearance is 59.5 mL/min (by C-G formula based on Cr of 1.37). ------------------------------------------------------------------------------------------------------------------  Recent Labs  05/06/15 1712  HGBA1C 10.7*   ------------------------------------------------------------------------------------------------------------------ No results for input(s): CHOL, HDL, LDLCALC, TRIG, CHOLHDL, LDLDIRECT in the last 72 hours. ------------------------------------------------------------------------------------------------------------------ No results for input(s): TSH, T4TOTAL, T3FREE, THYROIDAB in the last 72 hours.  Invalid input(s): FREET3 ------------------------------------------------------------------------------------------------------------------ No results for input(s): VITAMINB12, FOLATE, FERRITIN, TIBC, IRON, RETICCTPCT in the last 72 hours.  Coagulation profile No results for input(s): INR, PROTIME in the last 168 hours.  No results for input(s): DDIMER in the last 72 hours.  Cardiac Enzymes  Recent Labs Lab 05/07/15 1340 05/07/15 2048  TROPONINI <0.03  <0.03   ------------------------------------------------------------------------------------------------------------------ Invalid input(s): POCBNP   CBG:  Recent Labs Lab 05/08/15 1215 05/08/15 1756 05/08/15 2206 05/09/15 0754  05/09/15 1129  GLUCAP 231* 280* 278* 179* 247*       Studies: No results found.    Lab Results  Component Value Date   HGBA1C 10.7* 05/06/2015   HGBA1C 107.7 05/05/2015   HGBA1C 8.9 02/17/2015   Lab Results  Component Value Date   MICROALBUR 4.5* 11/08/2014   LDLCALC 72 07/05/2014   CREATININE 1.37* 05/09/2015       Scheduled Meds: . acidophilus  1 capsule Oral Daily  . aspirin EC  81 mg Oral Daily  . azithromycin  500 mg Oral Q24H  . budesonide-formoterol  2 puff Inhalation BID  . buPROPion  300 mg Oral Daily  . cefTRIAXone (ROCEPHIN)  IV  1 g Intravenous Q24H  . heparin  5,000 Units Subcutaneous 3 times per day  . insulin aspart  0-20 Units Subcutaneous TID WC  . insulin aspart  0-5 Units Subcutaneous QHS  . insulin aspart  10 Units Subcutaneous TID WC  . insulin glargine  40 Units Subcutaneous QHS  . isosorbide mononitrate  120 mg Oral Daily  . labetalol  100 mg Oral BID  . levETIRAcetam  750 mg Oral BID  . pantoprazole  80 mg Oral Daily  . potassium chloride SA  20 mEq Oral Daily  . simvastatin  20 mg Oral QHS   Continuous Infusions: . sodium chloride 75 mL/hr at 05/09/15 [id number]    Active Problems:   CAP (community acquired pneumonia)   AKI (acute kidney injury) ([place])   Nausea vomiting and diarrhea   Essential hypertension   Diabetes mellitus with complication (H[facility])   Dependent edema   HLD (hyperlipidemia)    Time spent: 45 minutes   [facility] Pager [phone]. If 7PM-7AM, please contact night-coverage at [email or web address], password [facility] 05/09/2015, 2:53 PM  LOS: 2 days

## 2015-05-09 NOTE — NC FL2 (Signed)
MEDICAID FL2 LEVEL OF CARE SCREENING TOOL     IDENTIFICATION  Patient Name: [name] Birthdate: 24-Jun-1940 Sex: female Admission Date (Current Location): 05/06/2015  [facility] and [place] Number:  Herbalist and Address:  [facility] [place], [place] [address], [place], [place] [address]      Provider Number: [id number]  Attending Physician Name and Address:  Reyne Dumas, MD  Relative Name and Phone Number:  Freda Munro daughter, [phone]    Current Level of Care: Hospital Recommended Level of Care: [place] Prior Approval Number:    Date Approved/Denied:   PASRR Number: [id number]  Discharge Plan: SNF    Current Diagnoses: Patient Active Problem List   Diagnosis Date Noted  . CAP (community acquired pneumonia) 05/06/2015  . AKI (acute kidney injury) ([place]) 05/06/2015  . Nausea vomiting and diarrhea 05/06/2015  . Essential hypertension 05/06/2015  . Diabetes mellitus with complication ([place]) [id number]  . Dependent edema 05/06/2015  . HLD (hyperlipidemia) 05/06/2015  . Wellness examination 07/05/2014  . Chronic left hip pain 11/27/2013  . Chronic pain syndrome 06/27/2013  . Renal insufficiency 03/19/2013  . Routine general medical examination at a health care facility 03/19/2013  . Encounter for long-term (current) use of other medications 03/09/2013  . Nausea & vomiting 11/22/2012  . Chronic diastolic CHF (congestive heart failure) ([place]) 08/10/2012  . Foot pain 02/22/2012  . DM (diabetes mellitus) ([place]) 02/22/2012  . Type 1 diabetes, uncontrolled, with peripheral circulatory disorder ([place]) 09/30/2011  . Personal history of colonic polyps 08/17/2011  . Edema 06/28/2011  . Gastroparesis 03/15/2011  . Low back pain 01/12/2011  . Cramp of limb 10/13/2010  . INGROWN TOENAIL 06/23/2010  . KNEE PAIN, RIGHT 02/03/2010  . Convulsions ([place]) 01/06/2010  . HYPERSOMNIA, ASSOCIATED WITH SLEEP APNEA 05/07/2009   . OTHER DYSPNEA AND RESPIRATORY ABNORMALITIES 04/23/2009  . COPD 04/22/2009  . HYPERCHOLESTEROLEMIA 02/25/2009  . HYPERTENSION 12/20/2008  . 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 HYPONATREMIA 04/22/2008  . SMOKER 02/22/2008  . DEPRESSION 02/22/2008  . DYSPNEA 02/22/2008  . Chest pain 02/22/2008  . ASYMPTOMATIC POSTMENOPAUSAL STATUS 02/22/2008  . HYPOKALEMIA 01/15/2008  . COLONIC POLYPS 09/13/2007  . DEGENERATIVE JOINT DISEASE 06/15/2007  . ALLERGIC RHINITIS 10/29/2006  . GERD 10/29/2006  . CIRRHOSIS 10/29/2006  . Osteoporosis 10/29/2006    Orientation RESPIRATION BLADDER Height & Weight    Self, Time, Situation, Place  Normal Incontinent 5\' 8"  (172.7 cm) 357 lbs.  BEHAVIORAL SYMPTOMS/MOOD NEUROLOGICAL BOWEL NUTRITION STATUS   (N/A)   Continent  (Please see DC summary)  AMBULATORY STATUS COMMUNICATION OF NEEDS Skin   Extensive Assist Verbally Normal                       Personal Care Assistance Level of Assistance  Bathing, Feeding, Dressing Bathing Assistance: Maximum assistance Feeding assistance: Independent Dressing Assistance: Limited assistance     Functional Limitations Info             SPECIAL CARE FACTORS FREQUENCY  PT (By licensed PT), OT (By licensed OT)     PT Frequency: min 2x/week OT Frequency: min 2x/week            Contractures      Additional Factors Info  Code Status, Allergies, Insulin Sliding Scale Code Status Info: Full Allergies Info: Hydrocodone, Lisinopril, Pioglitazone, Varenicline Tartrate   Insulin Sliding Scale Info: insulin aspart (novoLOG) injection 0-20 Units;insulin aspart (novoLOG) injection 10 Units;insulin glargine (LANTUS)  injection 40 Units       Current Medications (05/09/2015):  This is the current hospital active medication list Current Facility-Administered Medications  Medication Dose Route Frequency Provider Last Rate Last Dose  . 0.9 %  sodium chloride infusion   Intravenous  Continuous Reyne Dumas, MD 75 mL/hr at 05/09/15 0733    . acetaminophen (TYLENOL) tablet 650 mg  650 mg Oral Q6H PRN Waldemar Dickens, MD       Or  . acetaminophen (TYLENOL) suppository 650 mg  650 mg Rectal Q6H PRN Waldemar Dickens, MD      . acidophilus (RISAQUAD) capsule 1 capsule  1 capsule Oral Daily Waldemar Dickens, MD   1 capsule at 05/09/15 0849  . aspirin EC tablet 81 mg  81 mg Oral Daily Waldemar Dickens, MD   81 mg at 05/09/15 0849  . azithromycin (ZITHROMAX) tablet 500 mg  500 mg Oral Q24H Waldemar Dickens, MD   500 mg at 05/09/15 1514  . budesonide-formoterol (SYMBICORT) 160-4.5 MCG/ACT inhaler 2 puff  2 puff Inhalation BID Waldemar Dickens, MD   2 puff at 05/08/15 2250  . buPROPion (WELLBUTRIN XL) 24 hr tablet 300 mg  300 mg Oral Daily Waldemar Dickens, MD   300 mg at 05/09/15 0849  . cefTRIAXone (ROCEPHIN) 1 g in dextrose 5 % 50 mL IVPB  1 g Intravenous Q24H Waldemar Dickens, MD   1 g at 05/09/15 1514  . dextrose 50 % solution 25 mL  25 mL Intravenous PRN Reyne Dumas, MD      . heparin injection 5,000 Units  5,000 Units Subcutaneous 3 times per day Waldemar Dickens, MD   5,000 Units at 05/09/15 1514  . insulin aspart (novoLOG) injection 0-20 Units  0-20 Units Subcutaneous TID WC Gardiner Barefoot, NP   7 Units at 05/09/15 1316  . insulin aspart (novoLOG) injection 0-5 Units  0-5 Units Subcutaneous QHS Gardiner Barefoot, NP   3 Units at 05/08/15 2318  . insulin aspart (novoLOG) injection 10 Units  10 Units Subcutaneous TID WC Gardiner Barefoot, NP   10 Units at 05/09/15 1316  . insulin glargine (LANTUS) injection 40 Units  40 Units Subcutaneous QHS Gardiner Barefoot, NP   40 Units at 05/08/15 2318  . ipratropium-albuterol (DUONEB) 0.5-2.5 (3) MG/3ML nebulizer solution 3 mL  3 mL Nebulization Q4H PRN Waldemar Dickens, MD      . isosorbide mononitrate (IMDUR) 24 hr tablet 120 mg  120 mg Oral Daily Waldemar Dickens, MD   120 mg at 05/09/15 0849  . labetalol (NORMODYNE) tablet 100 mg   100 mg Oral BID Gardiner Barefoot, NP   100 mg at 05/09/15 0849  . levETIRAcetam (KEPPRA) tablet 750 mg  750 mg Oral BID Waldemar Dickens, MD   750 mg at 05/09/15 0850  . ondansetron (ZOFRAN) tablet 4 mg  4 mg Oral Q6H PRN Waldemar Dickens, MD       Or  . ondansetron [facility]) injection 4 mg  4 mg Intravenous Q6H PRN Waldemar Dickens, MD   4 mg at 05/06/15 2116  . oxyCODONE (Oxy IR/ROXICODONE) immediate release tablet 5 mg  5 mg Oral Q4H PRN Waldemar Dickens, MD   5 mg at 05/08/15 0356  . pantoprazole (PROTONIX) EC tablet 80 mg  80 mg Oral Daily Waldemar Dickens, MD   80 mg at 05/09/15 0849  . potassium chloride SA (K-DUR,KLOR-CON) CR tablet 20  mEq  20 mEq Oral Daily Waldemar Dickens, MD   20 mEq at 05/09/15 0848  . simvastatin (ZOCOR) tablet 20 mg  20 mg Oral QHS Waldemar Dickens, MD   20 mg at 05/08/15 2201     Discharge Medications: Please see discharge summary for a list of discharge medications.  Relevant Imaging Results:  Relevant Lab Results:   Additional Information SSN: [id number]  Benard Halsted, LCSWA

## 2015-05-09 NOTE — Consult Note (Signed)
   [facility] CM Inpatient Consult   05/09/2015  [place] 06-04-1940 [id number] Rferral received and screening for post hospital follow up needs.Patient evaluated for community based chronic disease management services with [facility] as a benefit of patient's [employer]. Spoke with patient at bedside to explain [facility] services for her chronic diseases of COPD and Diabetes. . Patient endorses Dr. Renato Shin to be her primary care provider.  Patient will receive post hospital discharge call and will be evaluated for monthly home visits for assessments and disease process education.  Left contact information and [facility] literature at bedside. Made Inpatient Case Manager aware that [place] [facility] following. Of note, [facility] Care Management services does not replace or interfere with any services that are arranged by inpatient case management or social work.  For additional questions or referrals please contact:   Natividad Brood, RN BSN [facility] Liaison  [phone] business mobile phone Toll free office [phone]

## 2015-05-09 NOTE — Telephone Encounter (Signed)
Requested a call back from the son to discuss.

## 2015-05-09 NOTE — Clinical Social Work Note (Signed)
Clinical Social Work Assessment  Patient Details  Name: [name] MRN: [id number] Date of Birth: 10/21/1940  Date of referral:  05/09/15               Reason for consult:  Facility Placement                Permission sought to share information with:  Facility Contact Representative, Family Supports Permission granted to share information::  Yes, Verbal Permission Granted  Name::     Sheila, daughter,   Agency::  [place] [facility]  Relationship::  Daughter  Contact Information:  [phone]  Housing/Transportation Living arrangements for the past 2 months:  Apartment Source of Information:  Patient, Adult Children Patient Interpreter Needed:  None Criminal Activity/Legal Involvement Pertinent to Current Situation/Hospitalization:  No - Comment as needed Significant Relationships:  Adult Children Lives with:  Adult Children Do you feel safe going back to the place where you live?  Yes Need for family participation in patient care:  Yes (Comment)  Care giving concerns:  CSW received referral for possible SNF placement at time of discharge. CSW met with patient regarding PT recommendation of SNF placement at time of discharge. Patient reported living with her daughter and being currently unable to receive 24 hour supervision at their home given patient's current physical needs and fall risk. Patient asked CSW to contact her daughter, Sheila, [phone]. Patient and patient's daughter expressed understanding of PT recommendation and are agreeable to SNF placement at time of discharge. CSW to continue to follow and assist with discharge planning needs.   Social Worker assessment / plan:  CSW spoke with patient and patient's daughter concerning possibility of rehab at SNF before returning home.  Employment status:  Retired Insurance information:  Managed Medicare PT Recommendations:  Skilled Nursing Facility Information / Referral to community resources:  Skilled  Nursing Facility  Patient/Family's Response to care:  Patient and patient's daughter recognize need for rehab before returning home and are agreeable to a SNF in [place]. Patient's daughter reported preference for [facility].  Patient/Family's Understanding of and Emotional Response to Diagnosis, Current Treatment, and Prognosis:  Patient is realistic regarding therapy needs. No questions/concerns about plan or treatment.    Emotional Assessment Appearance:  Appears stated age Attitude/Demeanor/Rapport:   (Appropriate) Affect (typically observed):  Accepting, Pleasant, Appropriate Orientation:  Oriented to Self, Oriented to Place, Oriented to  Time, Oriented to Situation Alcohol / Substance use:  Not Applicable Psych involvement (Current and /or in the community):  No (Comment)  Discharge Needs  Concerns to be addressed:  Care Coordination Readmission within the last 30 days:  No Current discharge risk:  None Barriers to Discharge:  Continued Medical Work up    S , LCSWA 05/09/2015, 3:42 PM  

## 2015-05-10 LAB — CBC
HEMATOCRIT: 37.5 % (ref 36.0–46.0)
HEMOGLOBIN: 12 g/dL (ref 12.0–15.0)
MCH: 31.7 pg (ref 26.0–34.0)
MCHC: 32 g/dL (ref 30.0–36.0)
MCV: 99.2 fL (ref 78.0–100.0)
Platelets: 206 10*3/uL (ref 150–400)
RBC: 3.78 MIL/uL — AB (ref 3.87–5.11)
RDW: 13.5 % (ref 11.5–15.5)
WBC: 5.1 10*3/uL (ref 4.0–10.5)

## 2015-05-10 LAB — COMPREHENSIVE METABOLIC PANEL
ALBUMIN: 2.7 g/dL — AB (ref 3.5–5.0)
ALK PHOS: 52 U/L (ref 38–126)
ALT: 14 U/L (ref 14–54)
ANION GAP: 7 (ref 5–15)
AST: 22 U/L (ref 15–41)
BILIRUBIN TOTAL: 1.3 mg/dL — AB (ref 0.3–1.2)
BUN: 16 mg/dL (ref 6–20)
CALCIUM: 8 mg/dL — AB (ref 8.9–10.3)
CO2: 29 mmol/L (ref 22–32)
CREATININE: 1.2 mg/dL — AB (ref 0.44–1.00)
Chloride: 106 mmol/L (ref 101–111)
GFR calc Af Amer: 51 mL/min — ABNORMAL LOW (ref >60)
GFR calc non Af Amer: 44 mL/min — ABNORMAL LOW (ref >60)
GLUCOSE: 151 mg/dL — AB (ref 65–99)
Potassium: 4.5 mmol/L (ref 3.5–5.1)
Sodium: 142 mmol/L (ref 135–145)
TOTAL PROTEIN: 5.7 g/dL — AB (ref 6.5–8.1)

## 2015-05-10 LAB — PROCALCITONIN

## 2015-05-10 LAB — GLUCOSE, CAPILLARY
GLUCOSE-CAPILLARY: 162 mg/dL — AB (ref 65–99)
GLUCOSE-CAPILLARY: 269 mg/dL — AB (ref 65–99)
GLUCOSE-CAPILLARY: 150 mg/dL — AB (ref 65–99)
Glucose-Capillary: 202 mg/dL — ABNORMAL HIGH (ref 65–99)

## 2015-05-10 MED ORDER — INSULIN GLARGINE 100 UNIT/ML ~~LOC~~ SOLN
45.0000 [IU] | Freq: Every day | SUBCUTANEOUS | Status: DC
  Administered 2015-05-10 – 2015-05-11 (×2): 45 [IU] via SUBCUTANEOUS
  Filled 2015-05-10 (×2): qty 0.45

## 2015-05-10 MED ORDER — INSULIN GLARGINE 100 UNIT/ML ~~LOC~~ SOLN
5.0000 [IU] | Freq: Once | SUBCUTANEOUS | Status: AC
  Administered 2015-05-10: 5 [IU] via SUBCUTANEOUS
  Filled 2015-05-10 (×2): qty 0.05

## 2015-05-10 MED ORDER — OXYCODONE-ACETAMINOPHEN 5-325 MG PO TABS
1.0000 | ORAL_TABLET | Freq: Once | ORAL | Status: AC
  Administered 2015-05-10: 1 via ORAL
  Filled 2015-05-10: qty 1

## 2015-05-10 NOTE — Clinical Social Work Note (Signed)
Patient has a bed at [facility] once medically stable for discharge. CSW has left message with facility.   CSW will remain available as needed.   Glendon Axe, MSW, LCSWA [phone] 05/10/2015 2:47 PM

## 2015-05-10 NOTE — Progress Notes (Signed)
[facility] PROGRESS NOTE  [name] [id number] DOB: Aug 04, 1940 DOA: 05/06/2015 PCP: Renato Shin, MD  Length of stay: 3   Assessment/Plan: Active Problems:   CAP (community acquired pneumonia)   AKI (acute kidney injury) ([place])   Nausea vomiting and diarrhea   Essential hypertension   Diabetes mellitus with complication ([place])   Dependent edema   HLD (hyperlipidemia)   Brief summary 75 y.o. female presents with 2 weeks of ongoing cough, nausea, vomiting, loss of appetite and has had decreased urine output over the last few days. He came to emergency department last night and left without being seen after triage. Chest x-ray at that time showed focal consolidation suspicious for pneumonia  . At that time she was also found to have an acute kidney injury but left prior to evaluation. She returns today for her evaluation, she is currently wheezing and has some increased effort but no evidence of hypoxemia. Has history of COPD and CHF documented but notably has preserved ejection fraction. Given IV fluids and Rocephin/azithromycin for community-acquired pneumonia coverage. Nebulizer treatments started for wheezing and Solu-Medrol administered for reactive airway component. Hospitalist was consulted for admission     Assessment and plan  CAP: CXR and symptoms suggestive of CAP. Lactic acid 1.7, trop nml, WBC 5.3, on RA.  -place on tele  - cont CTX, Azithro, day 5 - mucinex - DUonebs Steroids have been discontinued - procalcitonin <0.10 Blood  Cx NGSF , improving currently 95% on room air  Awaiting discharge to SNF, as soon as bed available   AKI: Cr. 2.14> 1.20, basleine 0.9. Suspect dehydration, improving  IV fluids have been discontinued    N/V/D: likely viral etiology. Improving - zofran - IVF - Probiotic   HTN: Hold  Norvasc, ACE inhibitor,  HCTZ given AKI, cont Imdur     Dysuria: chronic and intermittent - UA negative  - UCX  NGSF   Insulin-dependent diabetes mellitus Was  on glucose stabilizer given no improvement on SSI and lantus,   now back on SQ insulin, CBG stable  - A1c 10.7 Patient takes 75/25 mixed insulin 380 units per day at home and sometimes takes more if blood sugars are higher. States that she checks her blood sugars 3 times/day. Saw Dr. Loanne Drilling on 05/06/15  Patient likely need to switch to Lantus with pre-meal NovoLog given uncontrolled CBGs with her home regimen Increase Lantus to 45 units tonight   Dependent edema: h/o CHF though last 2014 Echo nml.EF 60% , repeat echo shows EF of 55-60% with grade 1 diastolic dysfunction and mild tricuspid regurgitation Hold IV fluids, resume Lasix when renal function has improved and is stable   HLD: - continue statin  Sezures: _ continue keppra  GERD: - continue ppi  Depression: - continue welbutrin    DVT prophylaxsis heparin  Code Status:      Code Status Orders        Start     Ordered   05/06/15 1641  Full code   Continuous     05/06/15 1644       Family Communication: Discussed in detail with the patient, all imaging results, lab results explained to the patient   Disposition Plan:  Anticipate discharge when bed is available      Consultants:  None   Procedures:  None   Antibiotics: Anti-infectives    Start     Dose/Rate Route Frequency Ordered Stop   05/07/15 1600  cefTRIAXone (ROCEPHIN) 1  g in dextrose 5 % 50 mL IVPB     1 g 100 mL/hr over 30 Minutes Intravenous Every 24 hours 05/06/15 1644 05/13/15 1559   05/07/15 1600  azithromycin (ZITHROMAX) tablet 500 mg     500 mg Oral Every 24 hours 05/06/15 1644 05/13/15 1559   05/06/15 1345  cefTRIAXone (ROCEPHIN) 1 g in dextrose 5 % 50 mL IVPB     1 g 100 mL/hr over 30 Minutes Intravenous  Once 05/06/15 1340 05/06/15 1607   05/06/15 1345  azithromycin (ZITHROMAX) 500 mg in dextrose 5 % 250 mL IVPB     500 mg 250 mL/hr over 60 Minutes Intravenous  Once 05/06/15 1340  05/06/15 1741         HPI/Subjective: Currently 91% on RA , denies any chest pain or shortness of breath,  Objective: Filed Vitals:   05/09/15 2016 05/09/15 2145 05/10/15 0520 05/10/15 0835  BP:  131/50 110/80   Pulse:  62 62   Temp:  98.5 F (36.9 C) 98.3 F (36.8 C)   TempSrc:  Oral Oral   Resp:  16 17   Height:      Weight:      SpO2: 98% 98% 95% 91%    Intake/Output Summary (Last 24 hours) at 05/10/15 1354 Last data filed at 05/10/15 1119  Gross per 24 hour  Intake 1301.25 ml  Output      0 ml  Net 1301.25 ml    Exam:  General:morbidly obese ,  Lungs: Clear to auscultation bilaterally without wheezes or crackles Cardiovascular: Regular rate and rhythm without murmur gallop or rub normal S1 and S2 Abdomen: Nontender, nondistended, soft, bowel sounds positive, no rebound, no ascites, no appreciable mass Extremities: No significant cyanosis, clubbing, or edema bilateral lower extremities     Data Review   Micro Results Recent Results (from the past 240 hour(s))  Blood culture (routine x 2)     Status: None (Preliminary result)   Collection Time: 05/06/15  2:12 PM  Result Value Ref Range Status   Specimen Description BLOOD RIGHT ANTECUBITAL  Final   Special Requests BOTTLES DRAWN AEROBIC AND ANAEROBIC 5MLS  Final   Culture NO GROWTH 4 DAYS  Final   Report Status PENDING  Incomplete  Blood culture (routine x 2)     Status: None (Preliminary result)   Collection Time: 05/06/15  2:15 PM  Result Value Ref Range Status   Specimen Description BLOOD LEFT ANTECUBITAL  Final   Special Requests   Final    BOTTLES DRAWN AEROBIC AND ANAEROBIC 10CCS BLUR 5CC RED   Culture NO GROWTH 4 DAYS  Final   Report Status PENDING  Incomplete  Urine culture     Status: None   Collection Time: 05/06/15  4:16 PM  Result Value Ref Range Status   Specimen Description URINE, RANDOM  Final   Special Requests NONE  Final   Culture MULTIPLE SPECIES PRESENT, SUGGEST RECOLLECTION   Final   Report Status 05/08/2015 FINAL  Final  Culture, sputum-assessment     Status: None   Collection Time: 05/09/15  6:54 AM  Result Value Ref Range Status   Specimen Description SPUTUM  Final   Special Requests NONE  Final   Sputum evaluation   Final    MICROSCOPIC FINDINGS SUGGEST THAT THIS SPECIMEN IS NOT REPRESENTATIVE OF LOWER RESPIRATORY SECRETIONS. PLEASE RECOLLECT. Gram Stain Report Called to,Read Back By and Verified With: AZenon Mayo AT [id number] ON [id number] BY Rhea Bleacher  Report Status 05/09/2015 FINAL  Final    Radiology Reports Dg Chest 2 View  05/05/2015  CLINICAL DATA:  Cough for 2 weeks, chest pain EXAM: CHEST  2 VIEW COMPARISON:  06/18/2014 FINDINGS: Mild peribronchial thickening. Airspace opacity in the left upper lobe concerning for pneumonia. Right lung is clear. Heart is upper limits normal in size. No effusions or acute bony abnormality. IMPRESSION: Mild bronchitic changes. Focal airspace opacity in the left upper lobe concerning for pneumonia. Electronically Signed   By: Rolm Baptise M.D.   On: 05/05/2015 16:36   Dg Abd 1 View  05/06/2015  CLINICAL DATA:  Evaluate for foreign body. Reports a pen needle broke off under skin. EXAM: ABDOMEN - 1 VIEW COMPARISON:  06/18/2014 FINDINGS: Pendulous pannus which is incompletely visualized. No retained needle is identified. Nonobstructive bowel gas pattern with moderate stool volume. No concerning intra-abdominal mass effect or calcification. Extreme inferior lung bases are clear. IMPRESSION: No evidence of retained needle, but the pannus is incompletely visualized. If ongoing concern, a repeat image with skin marker could be obtained. Electronically Signed   By: Monte Fantasia M.D.   On: 05/06/2015 15:41     CBC  Recent Labs Lab 05/06/15 1412 05/07/15 0559 05/08/15 0454 05/09/15 0536 05/10/15 0457  WBC 5.3 4.9 7.5 4.0 5.1  HGB 14.9 14.1 12.0 11.6* 12.0  HCT 44.3 43.2 37.4 37.6 37.5  PLT 250 275 235 218 206  MCV 97.1  97.3 97.9 98.4 99.2  MCH 32.7 31.8 31.4 30.4 31.7  MCHC 33.6 32.6 32.1 30.9 32.0  RDW 13.4 13.0 13.2 13.3 13.5  LYMPHSABS 2.2  --   --   --   --   MONOABS 0.5  --   --   --   --   EOSABS 0.1  --   --   --   --   BASOSABS 0.0  --   --   --   --     Chemistries   Recent Labs Lab 05/05/15 1610 05/06/15 1412 05/07/15 0559 05/08/15 0454 05/09/15 0536 05/10/15 0457  NA 140 137 134* 137 141 142  K 3.7 3.7 4.3 3.6 4.0 4.5  CL 93* 91* 94* 100* 106 106  CO2 32 33* 31 30 30 29   GLUCOSE 345* 340* 458* 136* 191* 151*  BUN 16 21* 23* 27* 19 16  CREATININE 2.20* 2.14* 2.02* 1.75* 1.37* 1.20*  CALCIUM 8.8* 8.5* 7.9* 7.8* 7.9* 8.0*  AST 20  --   --  14* 13* 22  ALT 13*  --   --  14 13* 14  ALKPHOS 79  --   --  56 55 52  BILITOT 1.2  --   --  0.3 0.6 1.3*   ------------------------------------------------------------------------------------------------------------------ estimated creatinine clearance is 68 mL/min (by C-G formula based on Cr of 1.2). ------------------------------------------------------------------------------------------------------------------ No results for input(s): HGBA1C in the last 72 hours. ------------------------------------------------------------------------------------------------------------------ No results for input(s): CHOL, HDL, LDLCALC, TRIG, CHOLHDL, LDLDIRECT in the last 72 hours. ------------------------------------------------------------------------------------------------------------------ No results for input(s): TSH, T4TOTAL, T3FREE, THYROIDAB in the last 72 hours.  Invalid input(s): FREET3 ------------------------------------------------------------------------------------------------------------------ No results for input(s): VITAMINB12, FOLATE, FERRITIN, TIBC, IRON, RETICCTPCT in the last 72 hours.  Coagulation profile No results for input(s): INR, PROTIME in the last 168 hours.  No results for input(s): DDIMER in the last 72  hours.  Cardiac Enzymes  Recent Labs Lab 05/07/15 1340 05/07/15 2048  TROPONINI <0.03 <0.03   ------------------------------------------------------------------------------------------------------------------ Invalid input(s): POCBNP   CBG:  Recent Labs Lab 05/09/15  1129 05/09/15 1700 05/09/15 2141 05/10/15 0803 05/10/15 1148  GLUCAP 247* 225* 236* 162* 269*       Studies: No results found.    Lab Results  Component Value Date   HGBA1C 10.7* 05/06/2015   HGBA1C 107.7 05/05/2015   HGBA1C 8.9 02/17/2015   Lab Results  Component Value Date   MICROALBUR 4.5* 11/08/2014   LDLCALC 72 07/05/2014   CREATININE 1.20* 05/10/2015       Scheduled Meds: . acidophilus  1 capsule Oral Daily  . aspirin EC  81 mg Oral Daily  . azithromycin  500 mg Oral Q24H  . budesonide-formoterol  2 puff Inhalation BID  . buPROPion  300 mg Oral Daily  . cefTRIAXone (ROCEPHIN)  IV  1 g Intravenous Q24H  . heparin  5,000 Units Subcutaneous 3 times per day  . insulin aspart  0-20 Units Subcutaneous TID WC  . insulin aspart  0-5 Units Subcutaneous QHS  . insulin aspart  10 Units Subcutaneous TID WC  . insulin glargine  45 Units Subcutaneous QHS  . insulin glargine  5 Units Subcutaneous Once  . isosorbide mononitrate  120 mg Oral Daily  . labetalol  100 mg Oral BID  . levETIRAcetam  750 mg Oral BID  . pantoprazole  80 mg Oral Daily  . potassium chloride SA  20 mEq Oral Daily  . simvastatin  20 mg Oral QHS   Continuous Infusions: . sodium chloride 75 mL/hr at 05/09/15 2353    Active Problems:   CAP (community acquired pneumonia)   AKI (acute kidney injury) ([place])   Nausea vomiting and diarrhea   Essential hypertension   Diabetes mellitus with complication ([facility])   Dependent edema   HLD (hyperlipidemia)    Time spent: 45 minutes   [facility] Pager [phone]. If 7PM-7AM, please contact night-coverage at [email or web address], password [facility] 05/10/2015, 1:54  PM  LOS: 3 days

## 2015-05-11 LAB — GLUCOSE, CAPILLARY
GLUCOSE-CAPILLARY: 206 mg/dL — AB (ref 65–99)
GLUCOSE-CAPILLARY: 222 mg/dL — AB (ref 65–99)
GLUCOSE-CAPILLARY: 210 mg/dL — AB (ref 65–99)
Glucose-Capillary: 82 mg/dL (ref 65–99)

## 2015-05-11 LAB — BASIC METABOLIC PANEL
ANION GAP: 4 — AB (ref 5–15)
BUN: 13 mg/dL (ref 6–20)
CHLORIDE: 109 mmol/L (ref 101–111)
CO2: 28 mmol/L (ref 22–32)
Calcium: 8.3 mg/dL — ABNORMAL LOW (ref 8.9–10.3)
Creatinine, Ser: 1.08 mg/dL — ABNORMAL HIGH (ref 0.44–1.00)
GFR, EST AFRICAN AMERICAN: 58 mL/min — AB (ref >60)
GFR, EST NON AFRICAN AMERICAN: 50 mL/min — AB (ref >60)
Glucose, Bld: 82 mg/dL (ref 65–99)
POTASSIUM: 4.5 mmol/L (ref 3.5–5.1)
SODIUM: 141 mmol/L (ref 135–145)

## 2015-05-11 LAB — CBC
HEMATOCRIT: 36.8 % (ref 36.0–46.0)
HEMOGLOBIN: 11.6 g/dL — AB (ref 12.0–15.0)
MCH: 31.2 pg (ref 26.0–34.0)
MCHC: 31.5 g/dL (ref 30.0–36.0)
MCV: 98.9 fL (ref 78.0–100.0)
Platelets: 195 10*3/uL (ref 150–400)
RBC: 3.72 MIL/uL — AB (ref 3.87–5.11)
RDW: 13.3 % (ref 11.5–15.5)
WBC: 4.3 10*3/uL (ref 4.0–10.5)

## 2015-05-11 LAB — CULTURE, BLOOD (ROUTINE X 2)
CULTURE: NO GROWTH
Culture: NO GROWTH

## 2015-05-11 MED ORDER — FUROSEMIDE 40 MG PO TABS: 40.0000 mg | ORAL_TABLET | Freq: Every day | ORAL | Status: DC

## 2015-05-11 MED ORDER — AZITHROMYCIN 500 MG PO TABS: 500.0000 mg | ORAL_TABLET | ORAL | Status: DC

## 2015-05-11 MED ORDER — INSULIN ASPART 100 UNIT/ML ~~LOC~~ SOLN: 5.0000 [IU] | Freq: Three times a day (TID) | SUBCUTANEOUS | Status: DC

## 2015-05-11 MED ORDER — INSULIN GLARGINE 100 UNIT/ML ~~LOC~~ SOLN: 40.0000 [IU] | Freq: Every day | SUBCUTANEOUS | Status: DC

## 2015-05-11 MED ORDER — LABETALOL HCL 100 MG PO TABS: 100.0000 mg | ORAL_TABLET | Freq: Two times a day (BID) | ORAL | Status: DC

## 2015-05-11 MED ORDER — CEFDINIR 300 MG PO CAPS: 300.0000 mg | ORAL_CAPSULE | Freq: Two times a day (BID) | ORAL | Status: DC

## 2015-05-11 MED ORDER — OXYCODONE HCL 5 MG PO TABS: 5.0000 mg | ORAL_TABLET | ORAL | Status: DC | PRN

## 2015-05-11 MED ORDER — INSULIN ASPART 100 UNIT/ML ~~LOC~~ SOLN: SUBCUTANEOUS | Status: DC

## 2015-05-11 MED ORDER — OXYCODONE-ACETAMINOPHEN 5-325 MG PO TABS
1.0000 | ORAL_TABLET | Freq: Once | ORAL | Status: AC
  Administered 2015-05-11: 1 via ORAL
  Filled 2015-05-11: qty 1

## 2015-05-11 NOTE — Progress Notes (Signed)
Patient c/o headache 7/10, tylenol 650mg  PO ineffective, refused oxy IR patient states medication did not help earlier today. Provider on call notified  and order for percocet x1 entered into [facility]. Jacqualyn Posey, RN

## 2015-05-11 NOTE — Progress Notes (Signed)
Await return call from [facility] re: transfer of pt today.  Creta Levin, [place] [id number] Weekend Coverage

## 2015-05-11 NOTE — Discharge Summary (Addendum)
Physician Discharge Summary  [name] MRN: [id number] DOB/AGE: August 06, 1940 75 y.o.  PCP: Renato Shin, MD   Admit date: 05/06/2015 Discharge date: 05/11/2015  Discharge Diagnoses:     Active Problems:   CAP (community acquired pneumonia)   AKI (acute kidney injury) ([place])   Nausea vomiting and diarrhea   Essential hypertension   Diabetes mellitus with complication (HCC)   Dependent edema   HLD (hyperlipidemia)    Follow-up recommendations Follow-up with PCP in 3-5 days , including all  additional recommended appointments as below Follow-up CBC, CMP in 3-5 days  Patient would benefit from outpatient sleep study to be arranged for by PCP     Medication List    STOP taking these medications        ALEVE PO     amLODipine 2.5 MG tablet  Commonly known as:  NORVASC     Insulin Lispro Prot & Lispro (75-25) 100 UNIT/ML Kwikpen  Commonly known as:  HUMALOG MIX 75/25 KWIKPEN     losartan-hydrochlorothiazide 100-25 MG tablet  Commonly known as:  HYZAAR     zolpidem 5 MG tablet  Commonly known as:  AMBIEN      TAKE these medications        ALIGN 4 MG Caps  Take 1 capsule (4 mg total) by mouth daily.     aspirin EC 81 MG tablet  Take 81 mg by mouth daily.     azithromycin 500 MG tablet  Commonly known as:  ZITHROMAX  Take 1 tablet (500 mg total) by mouth daily.     buPROPion 300 MG 24 hr tablet  Commonly known as:  WELLBUTRIN XL  Take 300 mg by mouth every morning.     cefdinir 300 MG capsule  Commonly known as:  OMNICEF  Take 1 capsule (300 mg total) by mouth 2 (two) times daily.     CLEAR EYES COMPLETE OP  Place 2 drops into both eyes daily.     clotrimazole-betamethasone cream  Commonly known as:  LOTRISONE  Apply 1 application topically 3 (three) times daily as needed. For itching or rash     DSS 100 MG Caps  Take 100 mg by mouth 2 (two) times daily.     furosemide 40 MG tablet  Commonly known as:  LASIX  Take 1 tablet (40 mg total)  by mouth daily.  Start taking on:  05/13/2015     insulin aspart 100 UNIT/ML injection  Commonly known as:  novoLOG  Inject 5 Units into the skin 3 (three) times daily with meals.     insulin aspart 100 UNIT/ML injection  Commonly known as:  novoLOG  Correction coverage:Resistant (obese, steroids) CBG < 70:implement hypoglycemia protocol CBG 70 - 120:0 units CBG 121 - 150:3 units CBG 151 - 200:4 units CBG 201 - 250:7 units CBG 251 - 300:11 units CBG 301 - 350:15 units CBG 351 - 400:20 units CBG > 400call MD and obtain STAT lab verification     insulin glargine 100 UNIT/ML injection  Commonly known as:  LANTUS  Inject 0.4 mLs (40 Units total) into the skin at bedtime.     isosorbide mononitrate 120 MG 24 hr tablet  Commonly known as:  IMDUR  Take 1 tablet (120 mg total) by mouth daily.     labetalol 100 MG tablet  Commonly known as:  NORMODYNE  Take 1 tablet (100 mg total) by mouth 2 (two) times daily.     levETIRAcetam 750 MG tablet  Commonly  known as:  KEPPRA  Take 1 tablet (750 mg total) by mouth 2 (two) times daily.     lubiprostone 24 MCG capsule  Commonly known as:  AMITIZA  TAKE 1 CAPSULE BY MOUTH EVERY DAY     omeprazole 40 MG capsule  Commonly known as:  PRILOSEC  TAKE 1 CAPSULE(40 MG) BY MOUTH DAILY     ondansetron 4 MG disintegrating tablet  Commonly known as:  ZOFRAN ODT  Take 1 tablet (4 mg total) by mouth every 8 (eight) hours as needed for nausea.     oxyCODONE 5 MG immediate release tablet  Commonly known as:  Oxy IR/ROXICODONE  Take 1 tablet (5 mg total) by mouth every 4 (four) hours as needed for moderate pain.     potassium chloride SA 20 MEQ tablet  Commonly known as:  K-DUR,KLOR-CON  TAKE 1 TABLET BY MOUTH EVERY DAY     PROVENTIL HFA 108 (90 Base) MCG/ACT inhaler  Generic drug:  albuterol  INHALE 2 PUFFS BY MOUTH TWICE DAILY AS NEEDED FOR WHEEZING.     simvastatin 40 MG tablet  Commonly known as:  ZOCOR  TAKE 1/2 TABLET BY  MOUTH AT BEDTIME     SYMBICORT 160-4.5 MCG/ACT inhaler  Generic drug:  budesonide-formoterol  INHALE 2 PUFFS BY MOUTH TWICE DAILY.     triamcinolone cream 0.1 %  Commonly known as:  KENALOG  Apply 1 application topically 3 (three) times daily as needed. for itching          Discharge Condition: *Stable Discharge Instructions       Discharge Instructions    AMB Referral to [facility]    Complete by:  As directed   Reason for consult:  Post hospital follow up  Diagnoses of:   COPD/ Pneumonia Diabetes    Expected date of contact:  1-3 days (reserved for hospital discharges)  Please assign to community nurse for transition of care calls and assess for home visits. Questions please call:  Natividad Brood, RN BSN [facility] Liaison  [phone] business mobile phone Toll free office [phone]     Diet - low sodium heart healthy    Complete by:  As directed      Increase activity slowly    Complete by:  As directed                Disposition: 07-Left Against Medical Advice   Consults: None Significant Diagnostic Studies:  Dg Chest 2 View  05/05/2015  CLINICAL DATA:  Cough for 2 weeks, chest pain EXAM: CHEST  2 VIEW COMPARISON:  06/18/2014 FINDINGS: Mild peribronchial thickening. Airspace opacity in the left upper lobe concerning for pneumonia. Right lung is clear. Heart is upper limits normal in size. No effusions or acute bony abnormality. IMPRESSION: Mild bronchitic changes. Focal airspace opacity in the left upper lobe concerning for pneumonia. Electronically Signed   By: Rolm Baptise M.D.   On: 05/05/2015 16:36   Dg Abd 1 View  05/06/2015  CLINICAL DATA:  Evaluate for foreign body. Reports a pen needle broke off under skin. EXAM: ABDOMEN - 1 VIEW COMPARISON:  06/18/2014 FINDINGS: Pendulous pannus which is incompletely visualized. No retained needle is identified. Nonobstructive bowel gas pattern with moderate stool volume. No  concerning intra-abdominal mass effect or calcification. Extreme inferior lung bases are clear. IMPRESSION: No evidence of retained needle, but the pannus is incompletely visualized. If ongoing concern, a repeat image with skin marker could be obtained. Electronically Signed  By: Monte Fantasia M.D.   On: 05/06/2015 15:41      2-D echo LV EF: 55% -  60%  ------------------------------------------------------------------- Indications:   Dyspnea 786.09.  ------------------------------------------------------------------- Study Conclusions  - Left ventricle: The cavity size was normal. Wall thickness was increased in a pattern of mild LVH. Systolic function was normal. The estimated ejection fraction was in the range of 55% to 60%. Wall motion was normal; there were no regional wall motion abnormalities. Doppler parameters are consistent with abnormal left ventricular relaxation (grade 1 diastolic dysfunction). - Mitral valve: Calcified annulus.  Impressions:  - Normal LV systolic function; grade 1 diastolic dysfunction; trace TR.    Filed Weights   05/06/15 1419  Weight: 161.934 kg (357 lb)     Microbiology: Recent Results (from the past 240 hour(s))  Blood culture (routine x 2)     Status: None (Preliminary result)   Collection Time: 05/06/15  2:12 PM  Result Value Ref Range Status   Specimen Description BLOOD RIGHT ANTECUBITAL  Final   Special Requests BOTTLES DRAWN AEROBIC AND ANAEROBIC 5MLS  Final   Culture NO GROWTH 4 DAYS  Final   Report Status PENDING  Incomplete  Blood culture (routine x 2)     Status: None (Preliminary result)   Collection Time: 05/06/15  2:15 PM  Result Value Ref Range Status   Specimen Description BLOOD LEFT ANTECUBITAL  Final   Special Requests   Final    BOTTLES DRAWN AEROBIC AND ANAEROBIC 10CCS BLUR 5CC RED   Culture NO GROWTH 4 DAYS  Final   Report Status PENDING  Incomplete  Urine culture     Status: None    Collection Time: 05/06/15  4:16 PM  Result Value Ref Range Status   Specimen Description URINE, RANDOM  Final   Special Requests NONE  Final   Culture MULTIPLE SPECIES PRESENT, SUGGEST RECOLLECTION  Final   Report Status 05/08/2015 FINAL  Final  Culture, sputum-assessment     Status: None   Collection Time: 05/09/15  6:54 AM  Result Value Ref Range Status   Specimen Description SPUTUM  Final   Special Requests NONE  Final   Sputum evaluation   Final    MICROSCOPIC FINDINGS SUGGEST THAT THIS SPECIMEN IS NOT REPRESENTATIVE OF LOWER RESPIRATORY SECRETIONS. PLEASE RECOLLECT. Gram Stain Report Called to,Read Back By and Verified With: AZenon Mayo AT [id number] ON [id number] BY Rhea Bleacher    Report Status 05/09/2015 FINAL  Final       Blood Culture    Component Value Date/Time   SDES SPUTUM 05/09/2015 0654   SPECREQUEST NONE 05/09/2015 0654   CULT MULTIPLE SPECIES PRESENT, SUGGEST RECOLLECTION 05/06/2015 1616   REPTSTATUS 05/09/2015 FINAL 05/09/2015 0654      Labs: Results for orders placed or performed during the hospital encounter of 05/06/15 (from the past 48 hour(s))  Glucose, capillary     Status: Abnormal   Collection Time: 05/09/15  5:00 PM  Result Value Ref Range   Glucose-Capillary 225 (H) 65 - 99 mg/dL  Glucose, capillary     Status: Abnormal   Collection Time: 05/09/15  9:41 PM  Result Value Ref Range   Glucose-Capillary 236 (H) 65 - 99 mg/dL  Procalcitonin     Status: None   Collection Time: 05/10/15  4:57 AM  Result Value Ref Range   Procalcitonin <0.10 ng/mL    Comment:        Interpretation: PCT (Procalcitonin) <= 0.5 ng/mL: Systemic infection (sepsis) is not likely.  Local bacterial infection is possible. (NOTE)         ICU PCT Algorithm               Non ICU PCT Algorithm    ----------------------------     ------------------------------         PCT < 0.25 ng/mL                 PCT < 0.1 ng/mL     Stopping of antibiotics            Stopping of antibiotics        strongly encouraged.               strongly encouraged.    ----------------------------     ------------------------------       PCT level decrease by               PCT < 0.25 ng/mL       >= 80% from peak PCT       OR PCT 0.25 - 0.5 ng/mL          Stopping of antibiotics                                             encouraged.     Stopping of antibiotics           encouraged.    ----------------------------     ------------------------------       PCT level decrease by              PCT >= 0.25 ng/mL       < 80% from peak PCT        AND PCT >= 0.5 ng/mL            Continuin g antibiotics                                              encouraged.       Continuing antibiotics            encouraged.    ----------------------------     ------------------------------     PCT level increase compared          PCT > 0.5 ng/mL         with peak PCT AND          PCT >= 0.5 ng/mL             Escalation of antibiotics                                          strongly encouraged.      Escalation of antibiotics        strongly encouraged.   CBC     Status: Abnormal   Collection Time: 05/10/15  4:57 AM  Result Value Ref Range   WBC 5.1 4.0 - 10.5 K/uL   RBC 3.78 (L) 3.87 - 5.11 MIL/uL   Hemoglobin 12.0 12.0 - 15.0 g/dL   HCT 37.5 36.0 - 46.0 %   MCV 99.2 78.0 - 100.0 fL   MCH 31.7 26.0 - 34.0 pg   MCHC  32.0 30.0 - 36.0 g/dL   RDW 13.5 11.5 - 15.5 %   Platelets 206 150 - 400 K/uL  Comprehensive metabolic panel     Status: Abnormal   Collection Time: 05/10/15  4:57 AM  Result Value Ref Range   Sodium 142 135 - 145 mmol/L   Potassium 4.5 3.5 - 5.1 mmol/L   Chloride 106 101 - 111 mmol/L   CO2 29 22 - 32 mmol/L   Glucose, Bld 151 (H) 65 - 99 mg/dL   BUN 16 6 - 20 mg/dL   Creatinine, Ser 1.20 (H) 0.44 - 1.00 mg/dL   Calcium 8.0 (L) 8.9 - 10.3 mg/dL   Total Protein 5.7 (L) 6.5 - 8.1 g/dL   Albumin 2.7 (L) 3.5 - 5.0 g/dL   AST 22 15 - 41 U/L   ALT 14 14 - 54 U/L   Alkaline Phosphatase 52  38 - 126 U/L   Total Bilirubin 1.3 (H) 0.3 - 1.2 mg/dL   GFR calc non Af Amer 44 (L) >60 mL/min   GFR calc Af Amer 51 (L) >60 mL/min    Comment: (NOTE) The eGFR has been calculated using the CKD EPI equation. This calculation has not been validated in all clinical situations. eGFR's persistently <60 mL/min signify possible Chronic Kidney Disease.    Anion gap 7 5 - 15  Glucose, capillary     Status: Abnormal   Collection Time: 05/10/15  8:03 AM  Result Value Ref Range   Glucose-Capillary 162 (H) 65 - 99 mg/dL  Glucose, capillary     Status: Abnormal   Collection Time: 05/10/15 11:48 AM  Result Value Ref Range   Glucose-Capillary 269 (H) 65 - 99 mg/dL  Glucose, capillary     Status: Abnormal   Collection Time: 05/10/15  4:35 PM  Result Value Ref Range   Glucose-Capillary 150 (H) 65 - 99 mg/dL  Glucose, capillary     Status: Abnormal   Collection Time: 05/10/15  9:33 PM  Result Value Ref Range   Glucose-Capillary 202 (H) 65 - 99 mg/dL  Glucose, capillary     Status: None   Collection Time: 05/11/15  7:45 AM  Result Value Ref Range   Glucose-Capillary 82 65 - 99 mg/dL  Basic metabolic panel     Status: Abnormal   Collection Time: 05/11/15  8:06 AM  Result Value Ref Range   Sodium 141 135 - 145 mmol/L   Potassium 4.5 3.5 - 5.1 mmol/L   Chloride 109 101 - 111 mmol/L   CO2 28 22 - 32 mmol/L   Glucose, Bld 82 65 - 99 mg/dL   BUN 13 6 - 20 mg/dL   Creatinine, Ser 1.08 (H) 0.44 - 1.00 mg/dL   Calcium 8.3 (L) 8.9 - 10.3 mg/dL   GFR calc non Af Amer 50 (L) >60 mL/min   GFR calc Af Amer 58 (L) >60 mL/min    Comment: (NOTE) The eGFR has been calculated using the CKD EPI equation. This calculation has not been validated in all clinical situations. eGFR's persistently <60 mL/min signify possible Chronic Kidney Disease.    Anion gap 4 (L) 5 - 15  CBC     Status: Abnormal   Collection Time: 05/11/15  8:06 AM  Result Value Ref Range   WBC 4.3 4.0 - 10.5 K/uL   RBC 3.72 (L) 3.87 -  5.11 MIL/uL   Hemoglobin 11.6 (L) 12.0 - 15.0 g/dL   HCT 36.8 36.0 - 46.0 %   MCV  98.9 78.0 - 100.0 fL   MCH 31.2 26.0 - 34.0 pg   MCHC 31.5 30.0 - 36.0 g/dL   RDW 13.3 11.5 - 15.5 %   Platelets 195 150 - 400 K/uL  Glucose, capillary     Status: Abnormal   Collection Time: 05/11/15 12:08 PM  Result Value Ref Range   Glucose-Capillary 206 (H) 65 - 99 mg/dL     Lipid Panel     Component Value Date/Time   CHOL 131 07/05/2014 1651   TRIG 160.0* 07/05/2014 1651   HDL 27.30* 07/05/2014 1651   CHOLHDL 5 07/05/2014 1651   VLDL 32.0 07/05/2014 1651   LDLCALC 72 07/05/2014 1651   LDLDIRECT 75.0 05/22/2014 1659     Lab Results  Component Value Date   HGBA1C 10.7* 05/06/2015   HGBA1C 107.7 05/05/2015   HGBA1C 8.9 02/17/2015     Lab Results  Component Value Date   MICROALBUR 4.5* 11/08/2014   LDLCALC 72 07/05/2014   CREATININE 1.08* 05/11/2015     Brief summary 75 y.o. female presents with 2 weeks of ongoing cough, nausea, vomiting, loss of appetite and has had decreased urine output over the last few days. He came to emergency department last night and left without being seen after triage. Chest x-ray at that time showed focal consolidation suspicious for pneumonia . At that time she was also found to have an acute kidney injury but left prior to evaluation. She returns today for her evaluation, she is currently wheezing and has some increased effort but no evidence of hypoxemia. Has history of COPD and CHF documented but notably has preserved ejection fraction. Given IV fluids and Rocephin/azithromycin for community-acquired pneumonia coverage. Nebulizer treatments started for wheezing and Solu-Medrol administered for reactive airway component. Hospitalist was consulted for admission    Assessment and plan  CAP: CXR 1/16 showed left upper lobe pneumonia  Lactic acid 1.7, trop nml, WBC 5.3, on RA.  Admitted to telemetry Data with Rocephin azithromycin, now transition to  azithromycin and Omnicef past 4 days - mucinex - DUonebs Steroids have been discontinued given uncontrolled CBGs - procalcitonin <0.10 Blood Cx NGSF , improving currently 95% on room air Patient may be discharged to SNF today bed available , Patient may have underlying OSA secondary to morbid obesity and would benefit from outpatient sleep study   AKI: Cr. 2.14> 1.08, basleine 0.9. Suspect dehydration, recheck BMP in 2-5 days     N/V/D: likely viral etiology. Improving Improved with Zofran and probiotic   HTN: Patient did not receive Hyzaar, Norvasc, Lasix Lasix may be resumed next week when renal function appears to be stable Patient currently on labetalol and Imdur  which will be continued    Dysuria: chronic and intermittent - UA negative  - UCX NGSF   Insulin-dependent diabetes mellitus Was on glucose stabilizer given no improvement on SSI and lantus, now back on SQ insulin, CBG stable  - A1c 10.7 Patient takes 75/25 mixed insulin 380 units per day at home and sometimes takes more if blood sugars are higher. States that she checks her blood sugars 3 times/day. Saw Dr. Loanne Drilling on 05/06/15  Patient now switched to sliding scale insulin, NovoLog as well as Lantus   Dependent edema: h/o CHF though last 2014 Echo nml.EF 60% , repeat echo shows EF of 55-60% with grade 1 diastolic dysfunction and mild tricuspid regurgitation Hold IV fluids, resume Lasix when renal function has improved and is stable   HLD: - continue statin  Sezures:  _ continue keppra  GERD: - continue ppi  Depression: - continue welbutrin     Discharge Exam:    Blood pressure 128/62, pulse 63, temperature 97.8 F (36.6 C), temperature source Oral, resp. rate 20, height _0  (1.727 m), weight 161.934 kg (357 lb), SpO2 98 %.  General:morbidly obese ,  Lungs: Clear to auscultation bilaterally without wheezes or crackles Cardiovascular: Regular rate and rhythm without murmur gallop  or rub normal S1 and S2 Abdomen: Nontender, nondistended, soft, bowel sounds positive, no rebound, no ascites, no appreciable mass Extremities: No significant cyanosis, clubbing, or edema bilateral lower extremities    Follow-up Information    Follow up with [facility].   Specialty:  Skilled Nursing Facility   Contact information:   [place] [phone]      Follow up with Renato Shin, MD. Schedule an appointment as soon as possible for a visit in 3 days.   Specialty:  Endocrinology   Contact information:   [address] [place] [address] [phone]       Signed: Reyne Dumas 05/11/2015, 12:50 PM        Time spent >45 mins

## 2015-05-11 NOTE — Progress Notes (Signed)
05/11/15 Patient to be discharged from the hospital to [facility], IV site to be removed and discharge instructions reviewed with patient.

## 2015-05-11 NOTE — Clinical Social Work Placement (Signed)
   CLINICAL SOCIAL WORK PLACEMENT  NOTE  Date:  05/11/2015  Patient Details  Name: [name] MRN: [id number] Date of Birth: [birth year]  Clinical Social Work is seeking post-discharge placement for this patient at the [place] level of care (*CSW will initial, date and re-position this form in  chart as items are completed):      Patient/family provided with [facility] Work Department's list of facilities offering this level of care within the geographic area requested by the patient (or if unable, by the patient's family).      Patient/family informed of their freedom to choose among providers that offer the needed level of care, that participate in Medicare, Medicaid or managed care program needed by the patient, have an available bed and are willing to accept the patient.      Patient/family informed of [facility]'s ownership interest in [facility] and [facility], as well as of the fact that they are under no obligation to receive care at these facilities.  PASRR submitted to EDS on 05/09/15     PASRR number received on 05/09/15     Existing PASRR number confirmed on       FL2 transmitted to all facilities in geographic area requested by pt/family on 05/09/15     FL2 transmitted to all facilities within larger geographic area on       Patient informed that his/her managed care company has contracts with or will negotiate with certain facilities, including the following:            Patient/family informed of bed offers received.  Patient chooses bed at     [facility] recommends and patient chooses bed at     [facility] Patient to be transferred to   on  .05/11/15  Patient to be transferred to facility by       Patient family notified on   of transfer. yes  Name of family member notified:      sheila  PHYSICIAN       Additional Comment:    _______________________________________________ Roanna Raider, LCSW 05/11/2015, 4:44 PM

## 2015-05-11 NOTE — Progress Notes (Signed)
Patient  c/o headache 10/10, cool compress applied, percocet x1 did help earlier reassessment was 5/10. Provider on call notified, another order for percocet x1 entered in [facility] by provider. Jacqualyn Posey, RN

## 2015-05-11 NOTE — Care Management Note (Deleted)
Case Management Note  Patient Details  Name: [name] MRN: [id number] Date of Birth: 1941-03-28  Subjective/Objective:            Admitted with ? Pna/ aki. From home with family.        Action/Plan: Awaiting PT's evaluation....... CM to f/u with d/c disposition.  Expected Discharge Date:                  Expected Discharge Plan:  Home/Self Care  In-House Referral:     Discharge planning Services  CM Consult  Post Acute Care Choice:    Choice offered to:     DME Arranged:    DME Agency:     HH Arranged:    HH Agency:     Status of Service:  In process, will continue to follow  Medicare Important Message Given:  Yes Date Medicare IM Given:    Medicare IM give by:    Date Additional Medicare IM Given:    Additional Medicare Important Message give by:     If discussed at [place] of Stay Meetings, dates discussed:    Additional Comments: Luane School (Daughter) [phone], Judeth Cornfield" Middle*  [phone]    Whitman Hero Friend, [place] [phone] 05/11/2015, 8:06 AM

## 2015-05-11 NOTE — Progress Notes (Signed)
[facility] here to take pt. To [facility]. Report already called to [facility] R.N. I.v d/ced on previous shift. vss pt already had/all night time meds. [facility] is okay with excepting patient tonight and that theyt have an admission nurse there tonight.

## 2015-05-11 NOTE — Clinical Social Work Placement (Signed)
   CLINICAL SOCIAL WORK PLACEMENT  NOTE  Date:  05/11/2015  Patient Details  Name: [name] MRN: [id number] Date of Birth: 1940/11/10  Clinical Social Work is seeking post-discharge placement for this patient at the [place] level of care (*CSW will initial, date and re-position this form in  chart as items are completed):      Patient/family provided with [place] Work Department's list of facilities offering this level of care within the geographic area requested by the patient (or if unable, by the patient's family).      Patient/family informed of their freedom to choose among providers that offer the needed level of care, that participate in Medicare, Medicaid or managed care program needed by the patient, have an available bed and are willing to accept the patient.      Patient/family informed of [facility]'s ownership interest in [facility] and [facility], as well as of the fact that they are under no obligation to receive care at these facilities.  PASRR submitted to EDS on 05/09/15     PASRR number received on 05/09/15     Existing PASRR number confirmed on       FL2 transmitted to all facilities in geographic area requested by pt/family on 05/09/15     FL2 transmitted to all facilities within larger geographic area on       Patient informed that his/her managed care company has contracts with or will negotiate with certain facilities, including the following:            Patient/family informed of bed offers received.  Patient chooses bed at     [facility]  Physician recommends and patient chooses bed at     [facility] Patient to be transferred to   on  .05/11/15  Patient to be transferred to facility by       Patient family notified on   of transfer.  Name of family member notified:        PHYSICIAN       Additional Comment:    _______________________________________________ Roanna Raider,  LCSW 05/11/2015, 4:44 PM

## 2015-05-12 ENCOUNTER — Other Ambulatory Visit: Payer: Self-pay

## 2015-05-12 NOTE — Patient Outreach (Signed)
Unsuccessful attempt made to contact patient at [phone] (received recording stating number was invalid), [phone], was able to leave a HIPPA compliant message at [phone].  Plan: Make another attempt to contact patient again tomorrow, attempt #2.

## 2015-05-13 ENCOUNTER — Other Ambulatory Visit: Payer: Self-pay

## 2015-05-13 ENCOUNTER — Telehealth: Payer: Self-pay | Admitting: Endocrinology

## 2015-05-13 NOTE — Telephone Encounter (Signed)
Patient need a hospital f/u in three days per Dr Reyne Dumas. I scheduled her feb 2nd, Can you get her in sooner?

## 2015-05-13 NOTE — Telephone Encounter (Signed)
I attempted to reach the pt to schedule. Mardene Celeste could you try to contact them. We could see her tomorrow at 2 pm for a office visit. Could you make it a 30 minute appointment? Thank you!

## 2015-05-13 NOTE — Patient Outreach (Signed)
This RNCM was unsuccessful in attempts to contact patient via telephone. Attempts made at [phone], [phone], was able to leave HIPPA compliant message at [phone] This is attempt #2.  Make another attempt on tomorrow, January 25

## 2015-05-13 NOTE — Patient Outreach (Signed)
Per hospital liaison, Natividad Brood, RN. Patient was discharged to a skilled nursing facility post acute care. Will follow upon discharge from nursing center.  Plan: Call nursing facility in 2 week for information on disposiiton.

## 2015-05-16 NOTE — Telephone Encounter (Signed)
Sorry Jinny Blossom I'm just seeing this message. Any other time??

## 2015-05-19 ENCOUNTER — Telehealth: Payer: Self-pay | Admitting: Endocrinology

## 2015-05-19 NOTE — Telephone Encounter (Signed)
I contacted the pt. In regards to the [facility] paper work. Pt is requesting the paper work to reflect he is the #1 form of transformation to office visit and hospitals. He is the care provider at home helping with meals, cleaning and anything the pt needs. Pt currently had to miss work due to taking the pt to the ED.

## 2015-05-19 NOTE — Telephone Encounter (Signed)
AJ calling to see if the FMLA paperwork for his job is completed

## 2015-05-20 NOTE — Telephone Encounter (Signed)
Pt's son advised of note below and voiced understanding.

## 2015-05-20 NOTE — Telephone Encounter (Signed)
There are numerous questions on the form. It is best for [place] to do this at the 05/22/15 ov.  Ok?

## 2015-05-22 ENCOUNTER — Encounter: Payer: Self-pay | Admitting: Endocrinology

## 2015-05-22 ENCOUNTER — Ambulatory Visit (INDEPENDENT_AMBULATORY_CARE_PROVIDER_SITE_OTHER): Payer: Medicare Other | Admitting: Endocrinology

## 2015-05-22 VITALS — BP 136/84 | HR 67 | Temp 98.0°F

## 2015-05-22 DIAGNOSIS — D649 Anemia, unspecified: Secondary | ICD-10-CM | POA: Diagnosis not present

## 2015-05-22 DIAGNOSIS — E871 Hypo-osmolality and hyponatremia: Secondary | ICD-10-CM

## 2015-05-22 LAB — BASIC METABOLIC PANEL
BUN: 15 mg/dL (ref 6–23)
CO2: 32 meq/L (ref 19–32)
Calcium: 9.5 mg/dL (ref 8.4–10.5)
Chloride: 97 mEq/L (ref 96–112)
Creatinine, Ser: 1.2 mg/dL (ref 0.40–1.20)
GFR: 56.55 mL/min — ABNORMAL LOW (ref >60.00)
GLUCOSE: 404 mg/dL — AB (ref 70–99)
POTASSIUM: 4.7 meq/L (ref 3.5–5.1)
SODIUM: 134 meq/L — AB (ref 135–145)

## 2015-05-22 LAB — CBC WITH DIFFERENTIAL/PLATELET
BASOS ABS: 0 10*3/uL (ref 0.0–0.1)
Basophils Relative: 0.3 % (ref 0.0–3.0)
EOS ABS: 0.1 10*3/uL (ref 0.0–0.7)
EOS PCT: 1.3 % (ref 0.0–5.0)
HCT: 38 % (ref 36.0–46.0)
HEMOGLOBIN: 12.1 g/dL (ref 12.0–15.0)
Lymphocytes Relative: 21.9 % (ref 12.0–46.0)
Lymphs Abs: 1.5 10*3/uL (ref 0.7–4.0)
MCHC: 31.8 g/dL (ref 30.0–36.0)
MCV: 97 fl (ref 78.0–100.0)
MONO ABS: 0.5 10*3/uL (ref 0.1–1.0)
Monocytes Relative: 8.2 % (ref 3.0–12.0)
NEUTROS PCT: 68.3 % (ref 43.0–77.0)
Neutro Abs: 4.6 10*3/uL (ref 1.4–7.7)
Platelets: 186 10*3/uL (ref 150.0–400.0)
RBC: 3.91 Mil/uL (ref 3.87–5.11)
RDW: 13.9 % (ref 11.5–15.5)
WBC: 6.7 10*3/uL (ref 4.0–10.5)

## 2015-05-22 NOTE — Patient Instructions (Signed)
blood tests are requested for you today.  We'll let you know about the results. Please come back for a follow-up appointment in 1 month. Please fax [place] cbg record.
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
## 2015-05-26 NOTE — Patient Outreach (Signed)
Chart review for community care coordination needs.  Plan: Telephonic contact on tomorrow, February 7

## 2015-05-27 ENCOUNTER — Other Ambulatory Visit: Payer: Self-pay

## 2015-05-27 NOTE — Patient Outreach (Signed)
Call made to [facility] to gather information on disposition. Left message for Chales Abrahams, Facility Discharge Planner, with contact information for this RNCM.  Plan: Await return call from [facility]

## 2015-05-30 ENCOUNTER — Telehealth: Payer: Self-pay | Admitting: Endocrinology

## 2015-05-30 MED ORDER — OXYCODONE HCL 5 MG PO TABS: 5.0000 mg | ORAL_TABLET | ORAL | Status: DC | PRN

## 2015-05-30 NOTE — Telephone Encounter (Signed)
See note below and please advise, Thanks! 

## 2015-05-30 NOTE — Telephone Encounter (Signed)
i printed 

## 2015-05-30 NOTE — Telephone Encounter (Signed)
Pt needs pain med rx please

## 2015-05-30 NOTE — Telephone Encounter (Signed)
Left a voicemail advising of note below. Requested a call back if the pt would like to discuss. Rx placed upfront.

## 2015-06-11 ENCOUNTER — Other Ambulatory Visit: Payer: Self-pay

## 2015-06-11 NOTE — Patient Outreach (Signed)
  Call made to [facility], spoke to Raphael Gibney, Facility Discharge Planner to follow up with patient's disposition. This RNCM was told by Louretta Shorten she forgot to call to advise me of patient's discharge from facility on February 10.  This RNCM was successful in making contact with patient for community case coordination.   Patient requested to be called on another day, she was very sleepy.  Plan: Will call patient on Friday, February 24

## 2015-06-19 DIAGNOSIS — I110 Hypertensive heart disease with heart failure: Secondary | ICD-10-CM | POA: Diagnosis not present

## 2015-06-19 DIAGNOSIS — I5032 Chronic diastolic (congestive) heart failure: Secondary | ICD-10-CM | POA: Diagnosis not present

## 2015-06-19 DIAGNOSIS — E119 Type 2 diabetes mellitus without complications: Secondary | ICD-10-CM | POA: Diagnosis not present

## 2015-06-19 DIAGNOSIS — M6281 Muscle weakness (generalized): Secondary | ICD-10-CM | POA: Diagnosis not present

## 2015-06-20 ENCOUNTER — Ambulatory Visit (INDEPENDENT_AMBULATORY_CARE_PROVIDER_SITE_OTHER): Payer: Medicare Other | Admitting: Endocrinology

## 2015-06-20 ENCOUNTER — Telehealth: Payer: Self-pay | Admitting: Endocrinology

## 2015-06-20 VITALS — BP 130/87 | HR 79 | Temp 98.3°F

## 2015-06-20 DIAGNOSIS — R079 Chest pain, unspecified: Secondary | ICD-10-CM | POA: Diagnosis not present

## 2015-06-20 DIAGNOSIS — Z129 Encounter for screening for malignant neoplasm, site unspecified: Secondary | ICD-10-CM

## 2015-06-20 DIAGNOSIS — Z720 Tobacco use: Secondary | ICD-10-CM | POA: Diagnosis not present

## 2015-06-20 DIAGNOSIS — E1122 Type 2 diabetes mellitus with diabetic chronic kidney disease: Secondary | ICD-10-CM

## 2015-06-20 DIAGNOSIS — G479 Sleep disorder, unspecified: Secondary | ICD-10-CM | POA: Diagnosis not present

## 2015-06-20 DIAGNOSIS — F17200 Nicotine dependence, unspecified, uncomplicated: Secondary | ICD-10-CM

## 2015-06-20 DIAGNOSIS — Z794 Long term (current) use of insulin: Secondary | ICD-10-CM

## 2015-06-20 DIAGNOSIS — E119 Type 2 diabetes mellitus without complications: Secondary | ICD-10-CM | POA: Insufficient documentation

## 2015-06-20 DIAGNOSIS — N183 Chronic kidney disease, stage 3 unspecified: Secondary | ICD-10-CM

## 2015-06-20 MED ORDER — POTASSIUM CHLORIDE ER 10 MEQ PO TBCR: 10.0000 meq | EXTENDED_RELEASE_TABLET | Freq: Every day | ORAL | Status: DC

## 2015-06-20 NOTE — Patient Instructions (Addendum)
Let's check a special heart x-ray.  you will receive a phone call, about a day and time for an appointment. Please reduce the potassium.  i have sent a prescription to your pharmacy.   Please continue the same insulins.   Please see a sleep specialist.  you will receive a phone call, about a day and time for an appointment. Please come back for a regular physical appointment in 1 month.

## 2015-06-20 NOTE — Progress Notes (Signed)
   Subjective:    Patient ID: [name], female    DOB: March 10, 1941, 75 y.o.   MRN: [id number]  HPI The state of at least three ongoing medical problems is addressed today, with interval history of each noted here: Pt returns for f/u of diabetes mellitus:  DM type: Insulin-requiring type 2. Dx'ed: A999333 Complications: PAD and renal insufficiency. Therapy: insulin since soon after dx.  GDM: never.  DKA: never.  Severe hypoglycemia: last episode was approx 2011.  Pancreatitis: never.  Other: she has severe insulin resistance; due to noncompliance, she takes multiple daily injections.  prognosis is poor, due to multiple med problems.  Interval history: no cbg record, but states cbg's are improved Pt states 6 weeks of moderate non-exertional pain at the LUQ of the abdomen, and assoc heartburn.  No assoc sob.     Review of Systems Pt reports insomnia (ref to sleep specialist was recommended from the hospital).  No change in chronic sob    Objective:   Physical Exam VITAL SIGNS:  See vs page GENERAL: no distress.  Morbid obesity.  In wheelchair.   LUNGS:  Clear to auscultation.  Chest wall: nontender.  HEART:  Regular rate and rhythm without murmurs noted. Normal S1,S2.      i personally reviewed electrocardiogram tracing (today): Indication: chest pain Impression: pulm dz pattern   i personally reviewed spirometry tracing (today): Pt is unable to stand for height/wt, so we used last weight (357), and pt's reported height (67 inches) Indication: smoker Impression: mild restriction.   Lab Results  Component Value Date   CREATININE 1.20 05/22/2015   BUN 15 05/22/2015   NA 134* 05/22/2015   K 4.7 05/22/2015   CL 97 05/22/2015   CO2 32 05/22/2015      Assessment & Plan:  Chest sxs, new, atypical for angina, but she has risk factors. 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 Hypokalemia: slightly overreplaced.   Patient is  advised the following: Patient Instructions  Let's check a special heart x-ray.  you will receive a phone call, about a day and time for an appointment. Please reduce the potassium.  i have sent a prescription to your pharmacy.   Please continue the same insulins.   Please see a sleep specialist.  you will receive a phone call, about a day and time for an appointment. Please come back for a regular physical appointment in 1 month.

## 2015-06-20 NOTE — Telephone Encounter (Signed)
Pt requests we call [facility] home health, and request continuation of PT.  [place] with me

## 2015-06-23 ENCOUNTER — Telehealth: Payer: Self-pay | Admitting: Endocrinology

## 2015-06-23 MED ORDER — INSULIN ASPART 100 UNIT/ML ~~LOC~~ SOLN: 5.0000 [IU] | Freq: Three times a day (TID) | SUBCUTANEOUS | Status: DC

## 2015-06-23 MED ORDER — INSULIN GLARGINE 100 UNIT/ML ~~LOC~~ SOLN: 40.0000 [IU] | Freq: Every day | SUBCUTANEOUS | Status: DC

## 2015-06-23 NOTE — Telephone Encounter (Signed)
Pt daughter said she needs insulin called into [facility] on [address] and that the pharmacy said they have faxed over a refill request already

## 2015-06-23 NOTE — Telephone Encounter (Signed)
Rx submitted per pt's request.  

## 2015-06-23 NOTE — Telephone Encounter (Signed)
[facility] note dated 06/20/15 6:03 PM and 06/21/15 11:57 Caller states her mother is out of insulin please call into [facility]. Dr. Loanne Drilling was supposed to call in the script but the pharmacy has nothing for the pt.

## 2015-06-23 NOTE — Telephone Encounter (Signed)
Rx for pt's insulin has been sent.

## 2015-06-24 ENCOUNTER — Other Ambulatory Visit: Payer: Self-pay | Admitting: Endocrinology

## 2015-06-24 DIAGNOSIS — Z1231 Encounter for screening mammogram for malignant neoplasm of breast: Secondary | ICD-10-CM

## 2015-06-24 NOTE — Telephone Encounter (Signed)
I contacted the pt's daughter and advised we have contacted [facility] home health. Shelton Silvas the care coordinator I spoke with stated she would get the process started for the PT.

## 2015-06-25 ENCOUNTER — Emergency Department (HOSPITAL_COMMUNITY)
Admission: EM | Admit: 2015-06-25 | Discharge: 2015-06-25 | Disposition: A | Discharge status: Home / Self Care | Payer: Medicare Other | Attending: Emergency Medicine | Admitting: Emergency Medicine

## 2015-06-25 ENCOUNTER — Encounter (HOSPITAL_COMMUNITY): Payer: Self-pay | Admitting: *Deleted

## 2015-06-25 ENCOUNTER — Emergency Department (HOSPITAL_COMMUNITY): Payer: Medicare Other

## 2015-06-25 DIAGNOSIS — Z79899 Other long term (current) drug therapy: Secondary | ICD-10-CM | POA: Diagnosis not present

## 2015-06-25 DIAGNOSIS — I252 Old myocardial infarction: Secondary | ICD-10-CM | POA: Insufficient documentation

## 2015-06-25 DIAGNOSIS — F17210 Nicotine dependence, cigarettes, uncomplicated: Secondary | ICD-10-CM | POA: Insufficient documentation

## 2015-06-25 DIAGNOSIS — E7800 Pure hypercholesterolemia, unspecified: Secondary | ICD-10-CM | POA: Insufficient documentation

## 2015-06-25 DIAGNOSIS — K219 Gastro-esophageal reflux disease without esophagitis: Secondary | ICD-10-CM | POA: Diagnosis not present

## 2015-06-25 DIAGNOSIS — R739 Hyperglycemia, unspecified: Secondary | ICD-10-CM

## 2015-06-25 DIAGNOSIS — M1990 Unspecified osteoarthritis, unspecified site: Secondary | ICD-10-CM | POA: Diagnosis not present

## 2015-06-25 DIAGNOSIS — Z7982 Long term (current) use of aspirin: Secondary | ICD-10-CM | POA: Insufficient documentation

## 2015-06-25 DIAGNOSIS — E669 Obesity, unspecified: Secondary | ICD-10-CM | POA: Diagnosis not present

## 2015-06-25 DIAGNOSIS — G4733 Obstructive sleep apnea (adult) (pediatric): Secondary | ICD-10-CM | POA: Diagnosis not present

## 2015-06-25 DIAGNOSIS — I10 Essential (primary) hypertension: Secondary | ICD-10-CM | POA: Diagnosis not present

## 2015-06-25 DIAGNOSIS — J449 Chronic obstructive pulmonary disease, unspecified: Secondary | ICD-10-CM | POA: Diagnosis not present

## 2015-06-25 DIAGNOSIS — G43909 Migraine, unspecified, not intractable, without status migrainosus: Secondary | ICD-10-CM | POA: Insufficient documentation

## 2015-06-25 DIAGNOSIS — Z780 Asymptomatic menopausal state: Secondary | ICD-10-CM | POA: Insufficient documentation

## 2015-06-25 DIAGNOSIS — E1165 Type 2 diabetes mellitus with hyperglycemia: Secondary | ICD-10-CM | POA: Insufficient documentation

## 2015-06-25 DIAGNOSIS — F329 Major depressive disorder, single episode, unspecified: Secondary | ICD-10-CM | POA: Insufficient documentation

## 2015-06-25 DIAGNOSIS — Z86010 Personal history of colonic polyps: Secondary | ICD-10-CM | POA: Insufficient documentation

## 2015-06-25 DIAGNOSIS — Z8673 Personal history of transient ischemic attack (TIA), and cerebral infarction without residual deficits: Secondary | ICD-10-CM | POA: Diagnosis not present

## 2015-06-25 DIAGNOSIS — Z794 Long term (current) use of insulin: Secondary | ICD-10-CM | POA: Insufficient documentation

## 2015-06-25 DIAGNOSIS — G8929 Other chronic pain: Secondary | ICD-10-CM | POA: Diagnosis not present

## 2015-06-25 LAB — URINALYSIS, ROUTINE W REFLEX MICROSCOPIC
Glucose, UA: >1000 mg/dL — AB
Hgb urine dipstick: NEGATIVE
KETONES UR: NEGATIVE mg/dL
LEUKOCYTES UA: NEGATIVE
NITRITE: NEGATIVE
PH: 6 (ref 5.0–8.0)
Protein, ur: 30 mg/dL — AB
SPECIFIC GRAVITY, URINE: 1.031 — AB (ref 1.005–1.030)

## 2015-06-25 LAB — BASIC METABOLIC PANEL
ANION GAP: 7 (ref 5–15)
BUN: 10 mg/dL (ref 6–20)
CHLORIDE: 101 mmol/L (ref 101–111)
CO2: 29 mmol/L (ref 22–32)
Calcium: 8.8 mg/dL — ABNORMAL LOW (ref 8.9–10.3)
Creatinine, Ser: 0.83 mg/dL (ref 0.44–1.00)
Glucose, Bld: 265 mg/dL — ABNORMAL HIGH (ref 65–99)
POTASSIUM: 3.4 mmol/L — AB (ref 3.5–5.1)
SODIUM: 137 mmol/L (ref 135–145)

## 2015-06-25 LAB — URINE MICROSCOPIC-ADD ON: Bacteria, UA: NONE SEEN

## 2015-06-25 LAB — CBC
HEMATOCRIT: 40.5 % (ref 36.0–46.0)
HEMOGLOBIN: 13.1 g/dL (ref 12.0–15.0)
MCH: 31.6 pg (ref 26.0–34.0)
MCHC: 32.3 g/dL (ref 30.0–36.0)
MCV: 97.6 fL (ref 78.0–100.0)
Platelets: 205 10*3/uL (ref 150–400)
RBC: 4.15 MIL/uL (ref 3.87–5.11)
RDW: 12.9 % (ref 11.5–15.5)
WBC: 7 10*3/uL (ref 4.0–10.5)

## 2015-06-25 LAB — CBG MONITORING, ED: Glucose-Capillary: 219 mg/dL — ABNORMAL HIGH (ref 65–99)

## 2015-06-25 MED ORDER — OXYCODONE-ACETAMINOPHEN 5-325 MG PO TABS
2.0000 | ORAL_TABLET | Freq: Once | ORAL | Status: AC
  Administered 2015-06-25: 2 via ORAL
  Filled 2015-06-25: qty 2

## 2015-06-25 MED ORDER — PROMETHAZINE HCL 25 MG PO TABS: 25.0000 mg | ORAL_TABLET | Freq: Four times a day (QID) | ORAL | Status: DC | PRN

## 2015-06-25 NOTE — ED Notes (Signed)
Bed: WA23 Expected date:  Expected time:  Means of arrival:  Comments: Hall B 
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
## 2015-06-25 NOTE — ED Notes (Signed)
Per EMS, pt from home, [facility] nurse reports pt's BG was elevated this am around 400s and was lethargic.  Has been having a hard time getting it controlled for the past 2 days.  Pt is IDDM.  Pt is awake and alert at this time.  EMS checked CBG - 383

## 2015-06-25 NOTE — ED Notes (Signed)
Bed: [facility] Expected date:  Expected time:  Means of arrival:  Comments: 

## 2015-06-25 NOTE — Discharge Instructions (Signed)
Hyperglycemia °Hyperglycemia occurs when the glucose (sugar) in your blood is too high. Hyperglycemia can happen for many reasons, but it most often happens to people who do not know they have diabetes or are not managing their diabetes properly.  °CAUSES  °Whether you have diabetes or not, there are other causes of hyperglycemia. Hyperglycemia can occur when you have diabetes, but it can also occur in other situations that you might not be as aware of, such as: °Diabetes °· If you have diabetes and are having problems controlling your blood glucose, hyperglycemia could occur because of some of the following reasons: °¨ Not following your meal plan. °¨ Not taking your diabetes medications or not taking it properly. °¨ Exercising less or doing less activity than you normally do. °¨ Being sick. °Pre-diabetes °· This cannot be ignored. Before people develop Type 2 diabetes, they almost always have "pre-diabetes." This is when your blood glucose levels are higher than normal, but not yet high enough to be diagnosed as diabetes. Research has shown that some long-term damage to the body, especially the heart and circulatory system, may already be occurring during pre-diabetes. If you take action to manage your blood glucose when you have pre-diabetes, you may delay or prevent Type 2 diabetes from developing. °Stress °· If you have diabetes, you may be "diet" controlled or on oral medications or insulin to control your diabetes. However, you may find that your blood glucose is higher than usual in the hospital whether you have diabetes or not. This is often referred to as "stress hyperglycemia." Stress can elevate your blood glucose. This happens because of hormones put out by the body during times of stress. If stress has been the cause of your high blood glucose, it can be followed regularly by your caregiver. That way he/she can make sure your hyperglycemia does not continue to get worse or progress to  diabetes. °Steroids °· Steroids are medications that act on the infection fighting system (immune system) to block inflammation or infection. One side effect can be a rise in blood glucose. Most people can produce enough extra insulin to allow for this rise, but for those who cannot, steroids make blood glucose levels go even higher. It is not unusual for steroid treatments to "uncover" diabetes that is developing. It is not always possible to determine if the hyperglycemia will go away after the steroids are stopped. A special blood test called an A1c is sometimes done to determine if your blood glucose was elevated before the steroids were started. °SYMPTOMS °· Thirsty. °· Frequent urination. °· Dry mouth. °· Blurred vision. °· Tired or fatigue. °· Weakness. °· Sleepy. °· Tingling in feet or leg. °DIAGNOSIS  °Diagnosis is made by monitoring blood glucose in one or all of the following ways: °· A1c test. This is a chemical found in your blood. °· Fingerstick blood glucose monitoring. °· Laboratory results. °TREATMENT  °First, knowing the cause of the hyperglycemia is important before the hyperglycemia can be treated. Treatment may include, but is not be limited to: °· Education. °· Change or adjustment in medications. °· Change or adjustment in meal plan. °· Treatment for an illness, infection, etc. °· More frequent blood glucose monitoring. °· Change in exercise plan. °· Decreasing or stopping steroids. °· Lifestyle changes. °HOME CARE INSTRUCTIONS  °· Test your blood glucose as directed. °· Exercise regularly. Your caregiver will give you instructions about exercise. Pre-diabetes or diabetes which comes on with stress is helped by exercising. °· Eat wholesome,   balanced meals. Eat often and at regular, fixed times. Your caregiver or nutritionist will give you a meal plan to guide your sugar intake. °· Being at an ideal weight is important. If needed, losing as little as 10 to 15 pounds may help improve blood  glucose levels. °SEEK MEDICAL CARE IF:  °· You have questions about medicine, activity, or diet. °· You continue to have symptoms (problems such as increased thirst, urination, or weight gain). °SEEK IMMEDIATE MEDICAL CARE IF:  °· You are vomiting or have diarrhea. °· Your breath smells fruity. °· You are breathing faster or slower. °· You are very sleepy or incoherent. °· You have numbness, tingling, or pain in your feet or hands. °· You have chest pain. °· Your symptoms get worse even though you have been following your caregiver's orders. °· If you have any other questions or concerns. °  °This information is not intended to replace advice given to you by your health care provider. Make sure you discuss any questions you have with your health care provider. °  °Document Released: 09/29/2000 Document Revised: 06/28/2011 Document Reviewed: 12/10/2014 °Elsevier Interactive Patient Education ©2016 [employer] Inc. ° °

## 2015-06-27 ENCOUNTER — Other Ambulatory Visit: Payer: Self-pay | Admitting: Endocrinology

## 2015-06-27 ENCOUNTER — Telehealth: Payer: Self-pay

## 2015-06-27 ENCOUNTER — Telehealth: Payer: Self-pay | Admitting: Endocrinology

## 2015-06-27 LAB — URINE CULTURE

## 2015-06-27 MED ORDER — OXYCODONE HCL 5 MG PO TABS: 5.0000 mg | ORAL_TABLET | ORAL | Status: DC | PRN

## 2015-06-27 NOTE — Telephone Encounter (Signed)
See note below and please advise.

## 2015-06-27 NOTE — Telephone Encounter (Signed)
Orders given.  

## 2015-06-27 NOTE — Telephone Encounter (Addendum)
Physical therapist called back and wanted to let you know about the interaction between lotrimazole-betamethasone (LOTRISONE) cream and triamcinolone cream (KENALOG) 0.1 %. PT stated he had to call and leave a message concerning this interaction.

## 2015-06-27 NOTE — Telephone Encounter (Signed)
ok 

## 2015-06-27 NOTE — Telephone Encounter (Signed)
Patients daughter called stating she would like a refill on pain medication    Thank you

## 2015-06-27 NOTE — Telephone Encounter (Signed)
I contacted the pt's daughter and advised rx is ready for pick up. Rx placed up front for the pt to come by and pick up,

## 2015-06-27 NOTE — Telephone Encounter (Signed)
[name] with [name] called requesting verbal orders for Pt 3 times a week for 6 weeks. Is this order ok? Thanks!

## 2015-06-27 NOTE — Telephone Encounter (Signed)
i printed 

## 2015-06-29 ENCOUNTER — Telehealth: Payer: Self-pay | Admitting: *Deleted

## 2015-06-29 NOTE — ED Notes (Signed)
Post ED Visit - Positive Culture Follow-up  Culture report reviewed by antimicrobial stewardship pharmacist:  []  Elenor Quinones, Pharm.D. []  Heide Guile, Pharm.D., BCPS [x]  Parks Neptune, Pharm.D. []  Alycia Rossetti, Pharm.D., BCPS []  [place], Pharm.D., BCPS, AAHIVP []  Legrand Como, Pharm.D., BCPS, AAHIVP []  Milus Glazier, Pharm.D. []  Stephens November, [place].D.  Positive urine culture Treated with none, organism sensitive to the same and no further patient follow-up is required at this time.  Harlon Flor [facility] 06/29/2015, 9:29 AM

## 2015-07-02 ENCOUNTER — Telehealth (HOSPITAL_COMMUNITY): Payer: Self-pay

## 2015-07-02 NOTE — Telephone Encounter (Signed)
Encounter complete. 

## 2015-07-03 ENCOUNTER — Telehealth (HOSPITAL_COMMUNITY): Payer: Self-pay

## 2015-07-03 NOTE — Telephone Encounter (Signed)
Encounter complete. 

## 2015-07-04 ENCOUNTER — Inpatient Hospital Stay (HOSPITAL_COMMUNITY): Admission: RE | Admit: 2015-07-04 | Payer: Medicare Other | Source: Ambulatory Visit

## 2015-07-07 ENCOUNTER — Ambulatory Visit
Admission: RE | Admit: 2015-07-07 | Discharge: 2015-07-07 | Disposition: A | Discharge status: Home / Self Care | Payer: Medicare Other | Source: Ambulatory Visit | Attending: Endocrinology | Admitting: Endocrinology

## 2015-07-07 DIAGNOSIS — Z1231 Encounter for screening mammogram for malignant neoplasm of breast: Secondary | ICD-10-CM

## 2015-07-08 ENCOUNTER — Inpatient Hospital Stay (HOSPITAL_COMMUNITY): Admission: RE | Admit: 2015-07-08 | Payer: Medicare Other | Source: Ambulatory Visit

## 2015-07-09 ENCOUNTER — Other Ambulatory Visit: Payer: Self-pay

## 2015-07-09 NOTE — Patient Outreach (Signed)
Late Entry: Patient declined [facility] Case Management [facility] Case Management at this time per our conversation our telephone conversation on February 24.  Plan: Send case closure letter to patient and primary care physician.

## 2015-07-15 ENCOUNTER — Telehealth (HOSPITAL_COMMUNITY): Payer: Self-pay

## 2015-07-15 NOTE — Telephone Encounter (Signed)
Encounter complete. 

## 2015-07-16 ENCOUNTER — Telehealth (HOSPITAL_COMMUNITY): Payer: Self-pay

## 2015-07-16 ENCOUNTER — Telehealth: Payer: Self-pay

## 2015-07-16 NOTE — Telephone Encounter (Signed)
LMOM regarding flu shot/survey call

## 2015-07-16 NOTE — Telephone Encounter (Signed)
Encounter complete. 

## 2015-07-17 ENCOUNTER — Ambulatory Visit (HOSPITAL_COMMUNITY)
Admission: RE | Admit: 2015-07-17 | Discharge: 2015-07-17 | Disposition: A | Discharge status: Home / Self Care | Payer: Medicare Other | Source: Ambulatory Visit | Attending: Endocrinology | Admitting: Endocrinology

## 2015-07-17 DIAGNOSIS — I10 Essential (primary) hypertension: Secondary | ICD-10-CM | POA: Insufficient documentation

## 2015-07-17 DIAGNOSIS — R0609 Other forms of dyspnea: Secondary | ICD-10-CM | POA: Insufficient documentation

## 2015-07-17 DIAGNOSIS — R079 Chest pain, unspecified: Secondary | ICD-10-CM | POA: Diagnosis not present

## 2015-07-17 DIAGNOSIS — E669 Obesity, unspecified: Secondary | ICD-10-CM | POA: Insufficient documentation

## 2015-07-17 DIAGNOSIS — R42 Dizziness and giddiness: Secondary | ICD-10-CM | POA: Insufficient documentation

## 2015-07-17 DIAGNOSIS — R5383 Other fatigue: Secondary | ICD-10-CM | POA: Insufficient documentation

## 2015-07-17 DIAGNOSIS — R0602 Shortness of breath: Secondary | ICD-10-CM | POA: Diagnosis not present

## 2015-07-17 DIAGNOSIS — G4733 Obstructive sleep apnea (adult) (pediatric): Secondary | ICD-10-CM | POA: Diagnosis not present

## 2015-07-17 DIAGNOSIS — E119 Type 2 diabetes mellitus without complications: Secondary | ICD-10-CM | POA: Diagnosis not present

## 2015-07-17 DIAGNOSIS — Z8249 Family history of ischemic heart disease and other diseases of the circulatory system: Secondary | ICD-10-CM | POA: Diagnosis not present

## 2015-07-17 DIAGNOSIS — Z720 Tobacco use: Secondary | ICD-10-CM | POA: Diagnosis not present

## 2015-07-17 DIAGNOSIS — Z6841 Body Mass Index (BMI) 40.0 and over, adult: Secondary | ICD-10-CM | POA: Insufficient documentation

## 2015-07-17 MED ORDER — TECHNETIUM TC 99M SESTAMIBI GENERIC - CARDIOLITE
28.6000 | Freq: Once | INTRAVENOUS | Status: AC | PRN
  Administered 2015-07-17: 28.6 via INTRAVENOUS

## 2015-07-18 ENCOUNTER — Ambulatory Visit (HOSPITAL_COMMUNITY)
Admission: RE | Admit: 2015-07-18 | Discharge: 2015-07-18 | Disposition: A | Discharge status: Home / Self Care | Payer: Medicare Other | Source: Ambulatory Visit | Attending: Cardiology | Admitting: Cardiology

## 2015-07-18 DIAGNOSIS — R079 Chest pain, unspecified: Secondary | ICD-10-CM | POA: Insufficient documentation

## 2015-07-18 LAB — MYOCARDIAL PERFUSION IMAGING
CHL CUP NUCLEAR SDS: 1
CHL CUP RESTING HR STRESS: 77 {beats}/min
CSEPPHR: 88 {beats}/min
LVDIAVOL: 100 mL (ref 46–106)
LVSYSVOL: 39 mL
NUC STRESS TID: 0.98
SRS: 0
SSS: 1

## 2015-07-18 MED ORDER — REGADENOSON 0.4 MG/5ML IV SOLN
0.4000 mg | Freq: Once | INTRAVENOUS | Status: AC
  Administered 2015-07-18: 0.4 mg via INTRAVENOUS

## 2015-07-18 MED ORDER — AMINOPHYLLINE 25 MG/ML IV SOLN
75.0000 mg | Freq: Once | INTRAVENOUS | Status: AC
  Administered 2015-07-18: 75 mg via INTRAVENOUS

## 2015-07-18 MED ORDER — TECHNETIUM TC 99M SESTAMIBI GENERIC - CARDIOLITE
30.1000 | Freq: Once | INTRAVENOUS | Status: AC | PRN
  Administered 2015-07-18: 30.1 via INTRAVENOUS

## 2015-07-21 ENCOUNTER — Other Ambulatory Visit: Payer: Self-pay | Admitting: Endocrinology

## 2015-07-21 MED FILL — Regadenoson IV Inj 0.4 MG/5ML (0.08 MG/ML): INTRAVENOUS | Qty: 5 | Status: AC

## 2015-07-22 NOTE — Telephone Encounter (Signed)
Please see message from yesterday.   i need to know which insulin pt is taking

## 2015-07-22 NOTE — Telephone Encounter (Signed)
Please refill prn 

## 2015-07-23 ENCOUNTER — Telehealth: Payer: Self-pay | Admitting: Endocrinology

## 2015-07-23 NOTE — Telephone Encounter (Signed)
[facility] note dated 07/21/15 5:10 pm Caller states that her mother does not have any symptoms that she needs triaged. Needs refill of her insulin she is completely out. She needs humalog 75/25 Claiborne Rigg

## 2015-07-23 NOTE — Telephone Encounter (Signed)
Rx submitted for Humalog 75/25.

## 2015-07-24 NOTE — Telephone Encounter (Signed)
PT is ok, but we don't need disease management.

## 2015-07-24 NOTE — Telephone Encounter (Signed)
See note below and please advise, Thanks! 

## 2015-07-24 NOTE — Telephone Encounter (Signed)
[facility] calling [name] # [phone] He is requesting PT order continue 2 times a week for the next month He is also requesting a skilled nursing consult medication and disease management

## 2015-07-25 NOTE — Telephone Encounter (Signed)
Left a vm advising of note below. Requested a call back if the pt would like to discuss.  

## 2015-07-28 ENCOUNTER — Telehealth: Payer: Self-pay | Admitting: *Deleted

## 2015-07-28 MED ORDER — OXYCODONE HCL 5 MG PO TABS: 5.0000 mg | ORAL_TABLET | ORAL | Status: DC | PRN

## 2015-07-28 NOTE — Telephone Encounter (Signed)
Pt's daughter called on behalf of pt requesting a refill of pt's Oxycodone. Please advise.

## 2015-07-28 NOTE — Telephone Encounter (Signed)
May have 50 tablets, no refills with directions to take twice a day as needed for pain

## 2015-07-28 NOTE — Telephone Encounter (Signed)
Refill with dose change printed for pt. Called daughter and advised her that she can pick up rx. She voiced understanding.

## 2015-07-29 ENCOUNTER — Telehealth: Payer: Self-pay | Admitting: Endocrinology

## 2015-07-29 NOTE — Telephone Encounter (Signed)
Pharmacist at [facility] [phone] calling for clarification on pts oxycodone rx, there are 2 sets of directions please advise

## 2015-07-29 NOTE — Telephone Encounter (Signed)
I called and clarified the Oxycodone rx should be 1 tablet twice daily as needed for pain.

## 2015-08-11 ENCOUNTER — Telehealth: Payer: Self-pay | Admitting: Endocrinology

## 2015-08-11 NOTE — Telephone Encounter (Signed)
See note below. Physical therapy appointment last week was missed by the pt.

## 2015-08-11 NOTE — Telephone Encounter (Signed)
FYI: PT appt last week was missed by the pt.

## 2015-08-12 ENCOUNTER — Telehealth: Payer: Self-pay | Admitting: Endocrinology

## 2015-08-12 NOTE — Telephone Encounter (Signed)
See note below and please advise, Thanks! 

## 2015-08-12 NOTE — Telephone Encounter (Signed)
cpx is due No need for nurse to go.  She needs to f/u here.

## 2015-08-12 NOTE — Telephone Encounter (Signed)
[facility] called and said that they cancelled the therapy appt for today because the Blood Sugar reading was 302.  She said they need a verbal order to be able to send a nurse out to the PT today to check on her.  CB# [phone]

## 2015-08-13 NOTE — Telephone Encounter (Signed)
I contacted [facility] on 08/12/2015 at 4pm and advised the nurse did not need to go out to see the pt at this time per Dr. Cordelia Pen instructions. I attempted to reach the daughter on 08/13/2015 to advise the pt's yearly CPE is due, but could not get in touch with her. Will attempt to reach her at a later time .

## 2015-08-13 NOTE — Telephone Encounter (Signed)
I contacted the pt's daughter and scheduled the pt's yearly physical on 09/02/2015.

## 2015-08-20 ENCOUNTER — Telehealth: Payer: Self-pay | Admitting: Endocrinology

## 2015-08-20 NOTE — Telephone Encounter (Signed)
Left a vm advising Physical Therapist Elta Guadeloupe of the verbal ok for physical therapy.

## 2015-08-20 NOTE — Telephone Encounter (Signed)
Mark PT from [place] called he need a verbal order for patient  2 weeks for four weeks. [phone]

## 2015-08-20 NOTE — Telephone Encounter (Signed)
ok 

## 2015-08-20 NOTE — Telephone Encounter (Signed)
See note below and please advise, Thanks! 

## 2015-08-25 ENCOUNTER — Encounter: Payer: Self-pay | Admitting: Endocrinology

## 2015-08-25 ENCOUNTER — Ambulatory Visit (INDEPENDENT_AMBULATORY_CARE_PROVIDER_SITE_OTHER): Payer: Medicare Other | Admitting: Endocrinology

## 2015-08-25 VITALS — BP 120/90 | HR 83 | Wt 348.6 lb

## 2015-08-25 DIAGNOSIS — Z794 Long term (current) use of insulin: Secondary | ICD-10-CM

## 2015-08-25 DIAGNOSIS — N183 Chronic kidney disease, stage 3 (moderate): Secondary | ICD-10-CM

## 2015-08-25 DIAGNOSIS — E1122 Type 2 diabetes mellitus with diabetic chronic kidney disease: Secondary | ICD-10-CM

## 2015-08-25 MED ORDER — INSULIN GLARGINE 100 UNIT/ML SOLOSTAR PEN: 70.0000 [IU] | PEN_INJECTOR | SUBCUTANEOUS | Status: DC

## 2015-08-25 MED ORDER — OXYCODONE HCL 5 MG PO TABS: 5.0000 mg | ORAL_TABLET | ORAL | Status: DC | PRN

## 2015-08-25 NOTE — Patient Instructions (Addendum)
check your blood sugar twice a day.  vary the time of day when you check, between before the 3 meals, and at bedtime.  also check if you have symptoms of your blood sugar being too high or too low.  please keep a record of the readings and bring it to your next appointment here (or you can bring the meter itself).  You can write it on any piece of paper.  please call us sooner if your blood sugar goes below 70, or if you have a lot of readings over 200.   Please take lantus 70 units each morning.  Please take this as your only insulin.  Take this no matter what your blood sugar is.   here is a prescription for your pain medication. Please come back for a follow-up appointment in 2 weeks.
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
## 2015-08-27 ENCOUNTER — Telehealth: Payer: Self-pay | Admitting: Endocrinology

## 2015-08-27 NOTE — Telephone Encounter (Signed)
[name] has started to develop blister on her feet wearing her diabetic, please advise on what to do.

## 2015-08-27 NOTE — Telephone Encounter (Signed)
Pt's daughter advised ov is needed to address the blisters. She voiced understanding and scheduled the pt for 5/12 at 3pm.

## 2015-08-27 NOTE — Telephone Encounter (Signed)
See note below and please advise, Thanks! 

## 2015-08-27 NOTE — Telephone Encounter (Signed)
Ov this week

## 2015-08-29 ENCOUNTER — Encounter: Payer: Self-pay | Admitting: Endocrinology

## 2015-08-29 ENCOUNTER — Ambulatory Visit (INDEPENDENT_AMBULATORY_CARE_PROVIDER_SITE_OTHER): Payer: Medicare Other | Admitting: Endocrinology

## 2015-08-29 VITALS — BP 132/64 | HR 75 | Temp 98.1°F | Wt 348.0 lb

## 2015-08-29 DIAGNOSIS — Z794 Long term (current) use of insulin: Secondary | ICD-10-CM

## 2015-08-29 DIAGNOSIS — E1122 Type 2 diabetes mellitus with diabetic chronic kidney disease: Secondary | ICD-10-CM | POA: Diagnosis not present

## 2015-08-29 DIAGNOSIS — N183 Chronic kidney disease, stage 3 (moderate): Secondary | ICD-10-CM | POA: Diagnosis not present
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
## 2015-08-29 NOTE — Patient Instructions (Addendum)
check your blood sugar twice a day.  vary the time of day when you check, between before the 3 meals, and at bedtime.  also check if you have symptoms of your blood sugar being too high or too low.  please keep a record of the readings and bring it to your next appointment here (or you can bring the meter itself).  You can write it on any piece of paper.  please call us sooner if your blood sugar goes below 70, or if you have a lot of readings over 200.   Please continue the same insulin.  Please keep the blister covered with antibiotic ointment and a large bandaid.  The blister will pop--then it will be important to carefully watch the area for any blister, and to continue to keep it covered.  Please come back soon, as scheduled.

## 2015-09-02 ENCOUNTER — Ambulatory Visit: Payer: Medicare Other | Admitting: Endocrinology

## 2015-09-17 ENCOUNTER — Ambulatory Visit (INDEPENDENT_AMBULATORY_CARE_PROVIDER_SITE_OTHER): Payer: Medicare Other | Admitting: Endocrinology

## 2015-09-17 ENCOUNTER — Encounter: Payer: Self-pay | Admitting: Endocrinology

## 2015-09-17 VITALS — BP 132/76 | HR 81 | Temp 98.4°F | Resp 12 | Wt 343.0 lb

## 2015-09-17 DIAGNOSIS — N183 Chronic kidney disease, stage 3 (moderate): Secondary | ICD-10-CM | POA: Diagnosis not present

## 2015-09-17 DIAGNOSIS — E876 Hypokalemia: Secondary | ICD-10-CM

## 2015-09-17 DIAGNOSIS — E1122 Type 2 diabetes mellitus with diabetic chronic kidney disease: Secondary | ICD-10-CM

## 2015-09-17 DIAGNOSIS — E7800 Pure hypercholesterolemia, unspecified: Secondary | ICD-10-CM | POA: Diagnosis not present

## 2015-09-17 DIAGNOSIS — Z794 Long term (current) use of insulin: Secondary | ICD-10-CM | POA: Diagnosis not present

## 2015-09-17 DIAGNOSIS — N289 Disorder of kidney and ureter, unspecified: Secondary | ICD-10-CM

## 2015-09-17 DIAGNOSIS — Z0000 Encounter for general adult medical examination without abnormal findings: Secondary | ICD-10-CM

## 2015-09-17 DIAGNOSIS — K7460 Unspecified cirrhosis of liver: Secondary | ICD-10-CM | POA: Diagnosis not present

## 2015-09-17 DIAGNOSIS — M810 Age-related osteoporosis without current pathological fracture: Secondary | ICD-10-CM | POA: Diagnosis not present

## 2015-09-17 LAB — POCT GLYCOSYLATED HEMOGLOBIN (HGB A1C): Hemoglobin A1C: 9.5

## 2015-09-17 MED ORDER — INSULIN LISPRO 100 UNIT/ML (KWIKPEN): 20.0000 [IU] | PEN_INJECTOR | Freq: Every day | SUBCUTANEOUS | Status: DC

## 2015-09-17 MED ORDER — INSULIN GLARGINE 100 UNIT/ML SOLOSTAR PEN: 60.0000 [IU] | PEN_INJECTOR | SUBCUTANEOUS | Status: DC

## 2015-09-17 NOTE — Patient Instructions (Addendum)
check your blood sugar twice a day.  vary the time of day when you check, between before the 3 meals, and at bedtime.  also check if you have symptoms of your blood sugar being too high or too low.  please keep a record of the readings and bring it to your next appointment here (or you can bring the meter itself).  You can write it on any piece of paper.  please call us sooner if your blood sugar goes below 70, or if you have a lot of readings over 200.   Please continue the same insulin.   please consider these measures for your health:  minimize alcohol.  do not use tobacco products.  have a colonoscopy at least every 10 years from age 68.  Women should have an annual mammogram from age 59.  keep firearms safely stored.  always use seat belts.  have working smoke alarms in your home.  see an eye doctor and dentist regularly.  never drive under the influence of alcohol or drugs (including prescription drugs).   it is critically important to prevent falling down (keep floor areas well-lit, dry, and free of loose objects.  If you have a cane, walker, or wheelchair, you should use it, even for short trips around the house.  Wear flat-soled shoes.  Also, try not to rush) Please keep the blister covered with antibiotic ointment and a large bandaid.  The blister will pop--then it will be important to carefully watch the area for any blister, and to continue to keep it covered.   Please reduce the lantus to 60 units each morning, and: Add humalog,20 units with the evening meal.    Please come back for a follow-up appointment in 3 months.

## 2015-09-17 NOTE — Progress Notes (Signed)
we discussed code status.  pt requests full code, but would not want to be started or maintained on artificial life-support measures if there was not a reasonable chance of recovery 
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
## 2015-09-18 LAB — BASIC METABOLIC PANEL
BUN: 15 mg/dL (ref 6–23)
CALCIUM: 9.7 mg/dL (ref 8.4–10.5)
CO2: 35 mEq/L — ABNORMAL HIGH (ref 19–32)
CREATININE: 1.2 mg/dL (ref 0.40–1.20)
Chloride: 93 mEq/L — ABNORMAL LOW (ref 96–112)
GFR: 56.5 mL/min — AB (ref >60.00)
GLUCOSE: 244 mg/dL — AB (ref 70–99)
Potassium: 3.7 mEq/L (ref 3.5–5.1)
Sodium: 135 mEq/L (ref 135–145)

## 2015-09-18 LAB — CBC WITH DIFFERENTIAL/PLATELET
BASOS ABS: 0 10*3/uL (ref 0.0–0.1)
Basophils Relative: 0.4 % (ref 0.0–3.0)
EOS ABS: 0.1 10*3/uL (ref 0.0–0.7)
Eosinophils Relative: 0.7 % (ref 0.0–5.0)
HCT: 43.1 % (ref 36.0–46.0)
Hemoglobin: 14.3 g/dL (ref 12.0–15.0)
LYMPHS ABS: 2.8 10*3/uL (ref 0.7–4.0)
Lymphocytes Relative: 30.4 % (ref 12.0–46.0)
MCHC: 33.1 g/dL (ref 30.0–36.0)
MCV: 94.7 fl (ref 78.0–100.0)
Monocytes Absolute: 0.6 10*3/uL (ref 0.1–1.0)
Monocytes Relative: 6.5 % (ref 3.0–12.0)
NEUTROS ABS: 5.7 10*3/uL (ref 1.4–7.7)
Neutrophils Relative %: 62 % (ref 43.0–77.0)
Platelets: 248 10*3/uL (ref 150.0–400.0)
RBC: 4.55 Mil/uL (ref 3.87–5.11)
RDW: 13.4 % (ref 11.5–15.5)
WBC: 9.2 10*3/uL (ref 4.0–10.5)

## 2015-09-18 LAB — URIC ACID: URIC ACID, SERUM: 6.9 mg/dL (ref 2.4–7.0)

## 2015-09-18 LAB — TSH: TSH: 1.51 u[IU]/mL (ref 0.35–4.50)

## 2015-09-18 LAB — LIPID PANEL
CHOL/HDL RATIO: 5
CHOLESTEROL: 156 mg/dL (ref 0–200)
HDL: 30.6 mg/dL — AB (ref >39.00)
LDL CALC: 86 mg/dL (ref 0–99)
NonHDL: 125.11
TRIGLYCERIDES: 198 mg/dL — AB (ref 0.0–149.0)
VLDL: 39.6 mg/dL (ref 0.0–40.0)

## 2015-09-18 LAB — VITAMIN D 25 HYDROXY (VIT D DEFICIENCY, FRACTURES): VITD: 7.06 ng/mL — ABNORMAL LOW (ref 30.00–100.00)

## 2015-09-18 MED ORDER — VITAMIN D (ERGOCALCIFEROL) 1.25 MG (50000 UNIT) PO CAPS: 50000.0000 [IU] | ORAL_CAPSULE | ORAL | Status: DC

## 2015-09-19 ENCOUNTER — Telehealth: Payer: Self-pay | Admitting: Endocrinology

## 2015-09-19 LAB — URINALYSIS, ROUTINE W REFLEX MICROSCOPIC
Bilirubin Urine: NEGATIVE
HGB URINE DIPSTICK: NEGATIVE
Ketones, ur: NEGATIVE
NITRITE: POSITIVE — AB
RBC / HPF: NONE SEEN (ref 0–?)
Specific Gravity, Urine: 1.015 (ref 1.000–1.030)
Total Protein, Urine: NEGATIVE
Urine Glucose: 100 — AB
Urobilinogen, UA: 0.2 (ref 0.0–1.0)
pH: 5.5 (ref 5.0–8.0)

## 2015-09-19 LAB — PTH, INTACT AND CALCIUM
CALCIUM: 9.2 mg/dL (ref 8.4–10.5)
PTH: 95 pg/mL — ABNORMAL HIGH (ref 14–64)

## 2015-09-19 NOTE — Telephone Encounter (Signed)
Please return call to priscilla

## 2015-09-19 NOTE — Telephone Encounter (Signed)
I contacted the pt's daughter and advised of recent blood tests. (please reference blood test results from 09/18/2015).

## 2015-09-19 NOTE — Addendum Note (Signed)
Addended by: Kaylyn Lim I on: 09/19/2015 03:58 PM   Modules accepted: Orders

## 2015-09-23 ENCOUNTER — Institutional Professional Consult (permissible substitution): Payer: Medicare Other | Admitting: Pulmonary Disease

## 2015-09-30 ENCOUNTER — Telehealth: Payer: Self-pay | Admitting: Endocrinology

## 2015-09-30 MED ORDER — OXYCODONE HCL 5 MG PO TABS: 5.0000 mg | ORAL_TABLET | ORAL | Status: DC | PRN

## 2015-09-30 NOTE — Telephone Encounter (Signed)
Patient would like her oxyCODONE (OXY IR/ROXICODONE) 5 MG immediate release tablet [id number] medication refilled.

## 2015-09-30 NOTE — Telephone Encounter (Signed)
I contacted the pt' daughter and advised rx is ready for pick up. Rx placed up front.

## 2015-09-30 NOTE — Telephone Encounter (Signed)
i printed 

## 2015-10-11 ENCOUNTER — Ambulatory Visit (HOSPITAL_COMMUNITY): Payer: Medicare Other

## 2015-10-11 ENCOUNTER — Encounter (HOSPITAL_COMMUNITY): Payer: Self-pay

## 2015-10-11 ENCOUNTER — Ambulatory Visit (HOSPITAL_COMMUNITY)
Admission: EM | Admit: 2015-10-11 | Discharge: 2015-10-11 | Disposition: A | Discharge status: Home / Self Care | Payer: Medicare Other | Attending: Family Medicine | Admitting: Family Medicine

## 2015-10-11 DIAGNOSIS — Z9889 Other specified postprocedural states: Secondary | ICD-10-CM | POA: Diagnosis not present

## 2015-10-11 DIAGNOSIS — F329 Major depressive disorder, single episode, unspecified: Secondary | ICD-10-CM | POA: Diagnosis not present

## 2015-10-11 DIAGNOSIS — J449 Chronic obstructive pulmonary disease, unspecified: Secondary | ICD-10-CM | POA: Diagnosis not present

## 2015-10-11 DIAGNOSIS — M1990 Unspecified osteoarthritis, unspecified site: Secondary | ICD-10-CM | POA: Diagnosis not present

## 2015-10-11 DIAGNOSIS — G4733 Obstructive sleep apnea (adult) (pediatric): Secondary | ICD-10-CM | POA: Insufficient documentation

## 2015-10-11 DIAGNOSIS — G8929 Other chronic pain: Secondary | ICD-10-CM | POA: Diagnosis not present

## 2015-10-11 DIAGNOSIS — K3184 Gastroparesis: Secondary | ICD-10-CM | POA: Diagnosis not present

## 2015-10-11 DIAGNOSIS — K219 Gastro-esophageal reflux disease without esophagitis: Secondary | ICD-10-CM | POA: Diagnosis not present

## 2015-10-11 DIAGNOSIS — I110 Hypertensive heart disease with heart failure: Secondary | ICD-10-CM | POA: Diagnosis not present

## 2015-10-11 DIAGNOSIS — Z888 Allergy status to other drugs, medicaments and biological substances status: Secondary | ICD-10-CM | POA: Diagnosis not present

## 2015-10-11 DIAGNOSIS — I252 Old myocardial infarction: Secondary | ICD-10-CM | POA: Insufficient documentation

## 2015-10-11 DIAGNOSIS — E7800 Pure hypercholesterolemia, unspecified: Secondary | ICD-10-CM | POA: Diagnosis not present

## 2015-10-11 DIAGNOSIS — L97529 Non-pressure chronic ulcer of other part of left foot with unspecified severity: Secondary | ICD-10-CM | POA: Diagnosis present

## 2015-10-11 DIAGNOSIS — K7460 Unspecified cirrhosis of liver: Secondary | ICD-10-CM | POA: Diagnosis not present

## 2015-10-11 DIAGNOSIS — F17210 Nicotine dependence, cigarettes, uncomplicated: Secondary | ICD-10-CM | POA: Insufficient documentation

## 2015-10-11 DIAGNOSIS — Z794 Long term (current) use of insulin: Secondary | ICD-10-CM | POA: Diagnosis not present

## 2015-10-11 DIAGNOSIS — E669 Obesity, unspecified: Secondary | ICD-10-CM | POA: Insufficient documentation

## 2015-10-11 DIAGNOSIS — E118 Type 2 diabetes mellitus with unspecified complications: Secondary | ICD-10-CM | POA: Diagnosis not present

## 2015-10-11 DIAGNOSIS — Z8673 Personal history of transient ischemic attack (TIA), and cerebral infarction without residual deficits: Secondary | ICD-10-CM | POA: Diagnosis not present

## 2015-10-11 DIAGNOSIS — Z7982 Long term (current) use of aspirin: Secondary | ICD-10-CM | POA: Insufficient documentation

## 2015-10-11 DIAGNOSIS — E1143 Type 2 diabetes mellitus with diabetic autonomic (poly)neuropathy: Secondary | ICD-10-CM | POA: Insufficient documentation

## 2015-10-11 DIAGNOSIS — G40909 Epilepsy, unspecified, not intractable, without status epilepticus: Secondary | ICD-10-CM | POA: Diagnosis not present

## 2015-10-11 DIAGNOSIS — E11621 Type 2 diabetes mellitus with foot ulcer: Secondary | ICD-10-CM | POA: Insufficient documentation

## 2015-10-11 DIAGNOSIS — Z79899 Other long term (current) drug therapy: Secondary | ICD-10-CM | POA: Insufficient documentation

## 2015-10-11 DIAGNOSIS — I509 Heart failure, unspecified: Secondary | ICD-10-CM | POA: Insufficient documentation

## 2015-10-11 MED ORDER — DOXYCYCLINE HYCLATE 100 MG PO CAPS: 100.0000 mg | ORAL_CAPSULE | Freq: Two times a day (BID) | ORAL | Status: DC

## 2015-10-11 NOTE — Discharge Instructions (Signed)
It was nice seeing you today. Your xray looks fine but I will still recommend MRI of your foot for better imaging. Also you need to be seen by a foot surgeon for debridement of your ulcer. Please see your PCP soon for referral. I have given you some antibiotic to protect you pending follow up with your PCP for surgical referral   Diabetes and Foot Care Diabetes may cause you to have problems because of poor blood supply (circulation) to your feet and legs. This may cause the skin on your feet to become thinner, break easier, and heal more slowly. Your skin may become dry, and the skin may peel and crack. You may also have nerve damage in your legs and feet causing decreased feeling in them. You may not notice minor injuries to your feet that could lead to infections or more serious problems. Taking care of your feet is one of the most important things you can do for yourself.  HOME CARE INSTRUCTIONS  Wear shoes at all times, even in the house. Do not go barefoot. Bare feet are easily injured.  Check your feet daily for blisters, cuts, and redness. If you cannot see the bottom of your feet, use a mirror or ask someone for help.  Wash your feet with warm water (do not use hot water) and mild soap. Then pat your feet and the areas between your toes until they are completely dry. Do not soak your feet as this can dry your skin.  Apply a moisturizing lotion or petroleum jelly (that does not contain alcohol and is unscented) to the skin on your feet and to dry, brittle toenails. Do not apply lotion between your toes.  Trim your toenails straight across. Do not dig under them or around the cuticle. File the edges of your nails with an emery board or nail file.  Do not cut corns or calluses or try to remove them with medicine.  Wear clean socks or stockings every day. Make sure they are not too tight. Do not wear knee-high stockings since they may decrease blood flow to your legs.  Wear shoes that fit  properly and have enough cushioning. To break in new shoes, wear them for just a few hours a day. This prevents you from injuring your feet. Always look in your shoes before you put them on to be sure there are no objects inside.  Do not cross your legs. This may decrease the blood flow to your feet.  If you find a minor scrape, cut, or break in the skin on your feet, keep it and the skin around it clean and dry. These areas may be cleansed with mild soap and water. Do not cleanse the area with peroxide, alcohol, or iodine.  When you remove an adhesive bandage, be sure not to damage the skin around it.  If you have a wound, look at it several times a day to make sure it is healing.  Do not use heating pads or hot water bottles. They may burn your skin. If you have lost feeling in your feet or legs, you may not know it is happening until it is too late.  Make sure your health care provider performs a complete foot exam at least annually or more often if you have foot problems. Report any cuts, sores, or bruises to your health care provider immediately. SEEK MEDICAL CARE IF:   You have an injury that is not healing.  You have cuts or breaks  in the skin.  You have an ingrown nail.  You notice redness on your legs or feet.  You feel burning or tingling in your legs or feet.  You have pain or cramps in your legs and feet.  Your legs or feet are numb.  Your feet always feel cold. SEEK IMMEDIATE MEDICAL CARE IF:   There is increasing redness, swelling, or pain in or around a wound.  There is a red line that goes up your leg.  Pus is coming from a wound.  You develop a fever or as directed by your health care provider.  You notice a bad smell coming from an ulcer or wound.   This information is not intended to replace advice given to you by your health care provider. Make sure you discuss any questions you have with your health care provider.   Document Released: 04/02/2000  Document Revised: 12/06/2012 Document Reviewed: 09/12/2012 Elsevier Interactive Patient Education [employer].
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
## 2015-10-11 NOTE — ED Notes (Signed)
Patient presents with blister on left foot x3 weeks and she states it is very hard and is not getting any better and would like to be check out No acute distress

## 2015-10-13 ENCOUNTER — Other Ambulatory Visit: Payer: Self-pay | Admitting: Endocrinology

## 2015-10-13 ENCOUNTER — Telehealth: Payer: Self-pay | Admitting: Endocrinology

## 2015-10-13 DIAGNOSIS — S90423D Blister (nonthermal), unspecified great toe, subsequent encounter: Secondary | ICD-10-CM

## 2015-10-13 NOTE — Telephone Encounter (Signed)
Would recommend that she be seen at the emergency room because she may need to be admitted for treatment

## 2015-10-13 NOTE — Telephone Encounter (Signed)
See note below. I contacted the pt's daughter, She stated the urgent care MD advised for the PCP to place a referral for a podiatrist to evaluate. Can you place the referral during Dr. Cordelia Pen absence? Thanks!

## 2015-10-13 NOTE — Telephone Encounter (Signed)
Order placed

## 2015-10-13 NOTE — Telephone Encounter (Signed)
I contacted the pt's daughter. Pt was evaluated in the ER on 10/11/2015 and the ER advised the pt should need to f/u with the podiatrist to be evaluated. She stated the ER advised the "blackness" on her big toe could be scrapped by a podiatrist.

## 2015-10-13 NOTE — Telephone Encounter (Signed)
I contacted the pt's daughter and advised referral to a podiatrist has been placed.

## 2015-10-13 NOTE — Telephone Encounter (Signed)
PT daughter called and said that the blister on her toe busted, they went to urgent care and urgent care said she needs to see someone ASAP because it will turn into a fungus.  PT big toe is now already black.  PT daughter requests call back to discuss what to do since Dr. Loanne Drilling is not here.

## 2015-10-16 ENCOUNTER — Ambulatory Visit (INDEPENDENT_AMBULATORY_CARE_PROVIDER_SITE_OTHER): Payer: Medicare Other | Admitting: Podiatry

## 2015-10-16 ENCOUNTER — Encounter: Payer: Self-pay | Admitting: Podiatry

## 2015-10-16 VITALS — BP 117/69 | HR 88 | Resp 12

## 2015-10-16 DIAGNOSIS — E11621 Type 2 diabetes mellitus with foot ulcer: Secondary | ICD-10-CM

## 2015-10-16 DIAGNOSIS — L89891 Pressure ulcer of other site, stage 1: Secondary | ICD-10-CM

## 2015-10-16 DIAGNOSIS — I739 Peripheral vascular disease, unspecified: Secondary | ICD-10-CM

## 2015-10-16 DIAGNOSIS — E1142 Type 2 diabetes mellitus with diabetic polyneuropathy: Secondary | ICD-10-CM | POA: Diagnosis not present

## 2015-10-16 DIAGNOSIS — L97529 Non-pressure chronic ulcer of other part of left foot with unspecified severity: Secondary | ICD-10-CM

## 2015-10-16 MED ORDER — AMOXICILLIN-POT CLAVULANATE 875-125 MG PO TABS: 1.0000 | ORAL_TABLET | Freq: Two times a day (BID) | ORAL | Status: DC

## 2015-10-16 MED ORDER — MUPIROCIN 2 % EX OINT: TOPICAL_OINTMENT | CUTANEOUS | Status: DC

## 2015-10-16 NOTE — Progress Notes (Signed)
She presents today for a chief complaint of a dark spot to the medial aspect of her left hallux that has been present for about a month. She went to her primary care provider who took an x-ray and stated that there was no bone problem. He placed her on antibiotics hearing cellulitis. She relates nocturnal pains and cramping to the calf which improves with dependency.  Objective: Vital signs are stable alert and oriented 3 pulses are barely palpable bilateral capillary fill time is sluggish. Feet are cool to the touch. Medial aspect of the hallux left demonstrates a large eschar which debrided today demonstrates superficial ulceration to the medial aspect of the toe with mild surrounding cellulitis. There is no purulence and no malodor.  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 Diabetic ulceration is present.  Plan: Debrided the wound today instructed her on how to dress the wound with a small amount of Bactroban ointment which will be prescribed. I also encouraged her to continue the antibiotics that she is on an once completed then start Augmentin 875 mg 1 by mouth twice a day I will follow up with her in 2-3 weeks. We also dispensed a Darco shoe. We are requesting vascular evaluation and treatment.

## 2015-10-17 ENCOUNTER — Telehealth: Payer: Self-pay | Admitting: *Deleted

## 2015-10-17 DIAGNOSIS — R0989 Other specified symptoms and signs involving the circulatory and respiratory systems: Secondary | ICD-10-CM

## 2015-10-17 NOTE — Telephone Encounter (Signed)
Left message for patient to call and schedule Lower arterial doppler ordered by Dr. Jacqualyn Posey

## 2015-10-17 NOTE — Telephone Encounter (Addendum)
-----   Message from Rip Harbour, [facility] sent at 10/16/2015  3:40 PM EDT ----- Regarding: Vascular studies Can you order and sched arterial doppler with vascular consult. Thanks! Orders and referral sent to [facility]

## 2015-10-20 ENCOUNTER — Other Ambulatory Visit: Payer: Self-pay | Admitting: Podiatry

## 2015-10-20 DIAGNOSIS — R0989 Other specified symptoms and signs involving the circulatory and respiratory systems: Secondary | ICD-10-CM

## 2015-10-22 ENCOUNTER — Encounter (HOSPITAL_COMMUNITY): Payer: Medicare Other

## 2015-10-24 ENCOUNTER — Other Ambulatory Visit: Payer: Self-pay | Admitting: Endocrinology

## 2015-10-28 ENCOUNTER — Ambulatory Visit (HOSPITAL_COMMUNITY)
Admission: RE | Admit: 2015-10-28 | Discharge: 2015-10-28 | Disposition: A | Discharge status: Home / Self Care | Payer: Medicare Other | Source: Ambulatory Visit | Attending: Podiatry | Admitting: Podiatry

## 2015-10-28 DIAGNOSIS — I110 Hypertensive heart disease with heart failure: Secondary | ICD-10-CM | POA: Diagnosis not present

## 2015-10-28 DIAGNOSIS — R938 Abnormal findings on diagnostic imaging of other specified body structures: Secondary | ICD-10-CM | POA: Diagnosis not present

## 2015-10-28 DIAGNOSIS — R0989 Other specified symptoms and signs involving the circulatory and respiratory systems: Secondary | ICD-10-CM | POA: Diagnosis not present

## 2015-10-28 DIAGNOSIS — E7800 Pure hypercholesterolemia, unspecified: Secondary | ICD-10-CM | POA: Insufficient documentation

## 2015-10-28 DIAGNOSIS — G4733 Obstructive sleep apnea (adult) (pediatric): Secondary | ICD-10-CM | POA: Diagnosis not present

## 2015-10-28 DIAGNOSIS — I509 Heart failure, unspecified: Secondary | ICD-10-CM | POA: Diagnosis not present

## 2015-10-28 DIAGNOSIS — F329 Major depressive disorder, single episode, unspecified: Secondary | ICD-10-CM | POA: Insufficient documentation

## 2015-10-28 DIAGNOSIS — E119 Type 2 diabetes mellitus without complications: Secondary | ICD-10-CM | POA: Insufficient documentation

## 2015-10-28 DIAGNOSIS — J449 Chronic obstructive pulmonary disease, unspecified: Secondary | ICD-10-CM | POA: Insufficient documentation

## 2015-10-28 DIAGNOSIS — K219 Gastro-esophageal reflux disease without esophagitis: Secondary | ICD-10-CM | POA: Diagnosis not present

## 2015-10-30 ENCOUNTER — Telehealth: Payer: Self-pay | Admitting: Endocrinology

## 2015-10-30 NOTE — Telephone Encounter (Signed)
Pt needs the refill for her pain meds

## 2015-10-31 MED ORDER — OXYCODONE HCL 5 MG PO TABS: 5.0000 mg | ORAL_TABLET | ORAL | Status: DC | PRN

## 2015-10-31 NOTE — Telephone Encounter (Signed)
Attempted to reach the pt and advise rx is ready for pick up. Phone number on file would not connect.

## 2015-10-31 NOTE — Telephone Encounter (Signed)
i printed 

## 2015-10-31 NOTE — Telephone Encounter (Signed)
I contacted the pt's daughter and advised rx is ready for pick up. Rx placed up front.

## 2015-11-03 NOTE — Telephone Encounter (Addendum)
-----   Message from Garrel Ridgel, [place] sent at 11/02/2015  8:53 PM EDT ----- Vessels look ok. 11/03/2015-Informed pt 's dtr of Dr.Hyatt's review of arterial dopplers.

## 2015-11-11 ENCOUNTER — Ambulatory Visit (INDEPENDENT_AMBULATORY_CARE_PROVIDER_SITE_OTHER): Payer: Medicare Other | Admitting: Podiatry

## 2015-11-11 ENCOUNTER — Encounter: Payer: Self-pay | Admitting: Podiatry

## 2015-11-11 DIAGNOSIS — L89891 Pressure ulcer of other site, stage 1: Secondary | ICD-10-CM | POA: Diagnosis not present

## 2015-11-11 DIAGNOSIS — E11621 Type 2 diabetes mellitus with foot ulcer: Secondary | ICD-10-CM

## 2015-11-11 DIAGNOSIS — L97529 Non-pressure chronic ulcer of other part of left foot with unspecified severity: Secondary | ICD-10-CM

## 2015-11-11 DIAGNOSIS — E1142 Type 2 diabetes mellitus with diabetic polyneuropathy: Secondary | ICD-10-CM

## 2015-11-11 NOTE — Progress Notes (Signed)
She presents today for follow-up of the ulceration to the medial aspect of the hallux left. She states this seems to be doing better. She also had her vascular study done which did demonstrate a decreased flow to the bilateral lower extremities.  Objective: Vital signs are stable she is alert and oriented 3. Vascular evaluation does demonstrate a 0.78 and a 0.68 right and left feet respectively. The ulceration appears to be healing left hallux. No signs of infection. Motion decreased by approximately 80%. No purulence and no malodor.  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 Also diabetic ulceration.  Plan: Debrided the wound today and recommended that she follow-up with [place] in a few weeks she will continue conservative therapies.

## 2015-11-12 ENCOUNTER — Inpatient Hospital Stay (HOSPITAL_COMMUNITY): Admission: RE | Admit: 2015-11-12 | Payer: Medicare Other | Source: Ambulatory Visit

## 2015-11-28 ENCOUNTER — Telehealth: Payer: Self-pay | Admitting: Endocrinology

## 2015-11-28 MED ORDER — OXYCODONE HCL 5 MG PO TABS: ORAL_TABLET | ORAL | 0 refills | Status: DC

## 2015-11-28 NOTE — Telephone Encounter (Signed)
See below. Could you sign for the pt's Oxycodone during Dr. Cordelia Pen absence? Thanks!

## 2015-11-28 NOTE — Addendum Note (Signed)
Addended by: Verlin Grills T on: 11/28/2015 01:51 PM   Modules accepted: Orders

## 2015-11-28 NOTE — Telephone Encounter (Signed)
I contacted the pt's daughter and advised rx is ready for pick up. Rx printed for MD to sign. Once md signs rx will be placed up front.

## 2015-11-28 NOTE — Telephone Encounter (Signed)
OK, with starting date 11/30/2075.

## 2015-11-28 NOTE — Telephone Encounter (Signed)
Pt needs pain meds asap she will be out Saturday night

## 2015-12-05 ENCOUNTER — Ambulatory Visit: Payer: Medicare Other | Admitting: Cardiovascular Disease

## 2015-12-09 ENCOUNTER — Ambulatory Visit (INDEPENDENT_AMBULATORY_CARE_PROVIDER_SITE_OTHER): Payer: Medicare Other | Admitting: Podiatry

## 2015-12-09 ENCOUNTER — Encounter: Payer: Self-pay | Admitting: Podiatry

## 2015-12-09 VITALS — BP 128/62 | HR 90 | Resp 16

## 2015-12-09 DIAGNOSIS — E11621 Type 2 diabetes mellitus with foot ulcer: Secondary | ICD-10-CM

## 2015-12-09 DIAGNOSIS — L89891 Pressure ulcer of other site, stage 1: Secondary | ICD-10-CM

## 2015-12-09 DIAGNOSIS — L97529 Non-pressure chronic ulcer of other part of left foot with unspecified severity: Principal | ICD-10-CM

## 2015-12-09 NOTE — Progress Notes (Signed)
She presents today for follow-up of a diabetic ulceration medial aspect of the first metatarsophalangeal joint. She states this seems to be doing better.  Objective: Reactive hyperkeratosis was debrided demonstrates only 2 small superficial ulcerations that are healing very well. No signs of infection.  Assessment: Healing diabetic ulceration left.  Plan: Redressed today and continue to do so at home. Follow-up with me in 1 month

## 2015-12-11 ENCOUNTER — Ambulatory Visit: Payer: Medicare Other | Admitting: Endocrinology

## 2015-12-16 ENCOUNTER — Institutional Professional Consult (permissible substitution): Payer: Medicare Other | Admitting: Pulmonary Disease

## 2015-12-17 ENCOUNTER — Ambulatory Visit: Payer: Medicare Other | Admitting: Endocrinology

## 2015-12-19 ENCOUNTER — Ambulatory Visit: Payer: Medicare Other | Admitting: Endocrinology

## 2015-12-29 ENCOUNTER — Ambulatory Visit
Admission: RE | Admit: 2015-12-29 | Discharge: 2015-12-29 | Disposition: A | Discharge status: Home / Self Care | Payer: Medicare Other | Source: Ambulatory Visit | Attending: Endocrinology | Admitting: Endocrinology

## 2015-12-29 ENCOUNTER — Encounter: Payer: Self-pay | Admitting: Endocrinology

## 2015-12-29 ENCOUNTER — Ambulatory Visit (INDEPENDENT_AMBULATORY_CARE_PROVIDER_SITE_OTHER): Payer: Medicare Other | Admitting: Endocrinology

## 2015-12-29 VITALS — Ht 70.0 in | Wt 350.0 lb

## 2015-12-29 DIAGNOSIS — S20212A Contusion of left front wall of thorax, initial encounter: Secondary | ICD-10-CM

## 2015-12-29 DIAGNOSIS — R569 Unspecified convulsions: Secondary | ICD-10-CM | POA: Diagnosis not present

## 2015-12-29 DIAGNOSIS — E118 Type 2 diabetes mellitus with unspecified complications: Secondary | ICD-10-CM | POA: Diagnosis not present

## 2015-12-29 DIAGNOSIS — Z23 Encounter for immunization: Secondary | ICD-10-CM

## 2015-12-29 LAB — POCT GLYCOSYLATED HEMOGLOBIN (HGB A1C): Hemoglobin A1C: 10.9

## 2015-12-29 MED ORDER — INSULIN GLARGINE 100 UNIT/ML SOLOSTAR PEN: 70.0000 [IU] | PEN_INJECTOR | SUBCUTANEOUS | 99 refills | Status: DC

## 2015-12-29 MED ORDER — GABAPENTIN 300 MG PO CAPS: 300.0000 mg | ORAL_CAPSULE | Freq: Two times a day (BID) | ORAL | 11 refills | Status: DC

## 2015-12-29 MED ORDER — INSULIN LISPRO 100 UNIT/ML (KWIKPEN): 70.0000 [IU] | PEN_INJECTOR | Freq: Every day | SUBCUTANEOUS | 11 refills | Status: DC

## 2015-12-29 MED ORDER — OXYCODONE HCL 5 MG PO TABS: ORAL_TABLET | ORAL | 0 refills | Status: DC
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
## 2015-12-29 NOTE — Patient Instructions (Addendum)
check your blood sugar twice a day.  vary the time of day when you check, between before the 3 meals, and at bedtime.  also check if you have symptoms of your blood sugar being too high or too low.  please keep a record of the readings and bring it to your next appointment here (or you can bring the meter itself).  You can write it on any piece of paper.  please call us sooner if your blood sugar goes below 70, or if you have a lot of readings over 200.   Please keep the blister covered with antibiotic ointment and a large bandaid.  The blister will pop--then it will be important to carefully watch the area for any blister, and to continue to keep it covered.   Please increase the lantus to 70 units each morning, and:  Add humalog to 70 units with the evening meal.    A chest x-ray is requested for you today.  We'll let you know about the results.  I have sent a prescription to your pharmacy, for gabapentin. Our goal is to reduce your need for oxycodone.  Here is a refill.  Try taking 1/2 pill at a time.   Please go back to see the neurology specialist.  you will receive a phone call, about a day and time for an appointment Please come back for a follow-up appointment in 2 months.

## 2016-01-06 ENCOUNTER — Encounter: Payer: Self-pay | Admitting: Cardiovascular Disease

## 2016-01-06 ENCOUNTER — Ambulatory Visit (INDEPENDENT_AMBULATORY_CARE_PROVIDER_SITE_OTHER): Payer: Medicare Other | Admitting: Cardiovascular Disease

## 2016-01-06 VITALS — BP 174/82 | HR 76 | Ht 72.0 in | Wt 353.0 lb

## 2016-01-06 DIAGNOSIS — R079 Chest pain, unspecified: Secondary | ICD-10-CM | POA: Diagnosis not present

## 2016-01-06 DIAGNOSIS — I209 Angina pectoris, unspecified: Secondary | ICD-10-CM | POA: Diagnosis not present

## 2016-01-06 DIAGNOSIS — I10 Essential (primary) hypertension: Secondary | ICD-10-CM | POA: Diagnosis not present

## 2016-01-06 NOTE — Assessment & Plan Note (Signed)
History of hyperlipidemia on statin therapy with recent lipid profile performed 09/17/15 revealed total cholesterol 156, LDL 86 and HDL of 30

## 2016-01-06 NOTE — Progress Notes (Signed)
01/06/2016 [name]   [birth year]  [id number]  Primary Physician Kylie Shin, MD Primary Cardiologist: Kylie Harp MD [name]  HPI:  [name] is a 75 year old morbidly overweight widowed African-American female mother of 5 children is accompanied by her daughter [name] today. She is referred by her PCP, [name], for cardiovascular evaluation because of chest pain. Risk factors include 100 pack years of tobacco abuse currently smoking 1-1/2 packs per day, 2 hypertension, diabetes and hyperlipidemia. She's never had a heart attack or stroke. There apparently is documentation of a heart catheterization performed in 2011 that was normal. She's had chest pain last 3-4 months occurring several times a week lasting minutes at a time with radiation to her left neck.   Current Outpatient Prescriptions  Medication Sig Dispense Refill  . amLODipine (NORVASC) 2.5 MG tablet TAKE 1 TABLET BY MOUTH DAILY 90 tablet 0  . aspirin EC 81 MG tablet Take 81 mg by mouth daily.    [name] buPROPion (WELLBUTRIN XL) 300 MG 24 hr tablet Take 300 mg by mouth every morning.     . clotrimazole-betamethasone (LOTRISONE) cream Apply 1 application topically 3 (three) times daily as needed. For itching or rash 45 g 3  . Docusate Sodium (DSS) 100 MG CAPS Take 100 mg by mouth 2 (two) times daily. (Patient taking differently: Take 100 mg by mouth daily. ) 100 each 10  . furosemide (LASIX) 40 MG tablet Take 1 tablet (40 mg total) by mouth daily. 60 tablet 11  . gabapentin (NEURONTIN) 300 MG capsule Take 1 capsule (300 mg total) by mouth 2 (two) times daily. 60 capsule 11  . Hyprom-Naphaz-Polysorb-Zn Sulf (CLEAR EYES COMPLETE OP) Place 2 drops into both eyes daily.     . Insulin Glargine (LANTUS SOLOSTAR) 100 UNIT/ML Solostar Pen Inject 70 Units into the skin every morning. And pen needles 1/day 10 pen PRN  . insulin lispro (HUMALOG KWIKPEN) 100 UNIT/ML KiwkPen Inject 0.7 mLs (70  Units total) into the skin daily with supper. And pen needles 3/day 30 mL 11  . isosorbide mononitrate (IMDUR) 120 MG 24 hr tablet Take 1 tablet (120 mg total) by mouth daily. 30 tablet 11  . labetalol (NORMODYNE) 100 MG tablet Take 1 tablet (100 mg total) by mouth 2 (two) times daily. 120 tablet 1  . levETIRAcetam (KEPPRA) 750 MG tablet Take 1 tablet (750 mg total) by mouth 2 (two) times daily. 60 tablet 0  . losartan-hydrochlorothiazide (HYZAAR) 100-25 MG tablet Take 1 tablet by mouth daily.  2  . lubiprostone (AMITIZA) 24 MCG capsule TAKE 1 CAPSULE BY MOUTH EVERY DAY 30 capsule 11  . mupirocin ointment (BACTROBAN) 2 % Apply to wound twice a day. 30 g 2  . omeprazole (PRILOSEC) 40 MG capsule TAKE 1 CAPSULE(40 MG) BY MOUTH DAILY 30 capsule 0  . ondansetron (ZOFRAN ODT) 4 MG disintegrating tablet Take 1 tablet (4 mg total) by mouth every 8 (eight) hours as needed for nausea. 15 tablet 0  . oxyCODONE (OXY IR/ROXICODONE) 5 MG immediate release tablet Take 1/2 tablet by mouth 3 times daily as needed for pain. 50 tablet 0  . potassium chloride (KLOR-CON 10) 10 MEQ tablet Take 1 tablet (10 mEq total) by mouth daily. 30 tablet 11  . Probiotic Product (ALIGN) 4 MG CAPS Take 1 capsule (4 mg total) by mouth daily. 30 capsule 6  . PROVENTIL HFA 108 (90 BASE) MCG/ACT inhaler INHALE 2 PUFFS BY MOUTH TWICE DAILY AS  NEEDED FOR WHEEZING. 6.7 g 0  . simvastatin (ZOCOR) 40 MG tablet TAKE 1/2 TABLET BY MOUTH AT BEDTIME 45 tablet 0  . SYMBICORT 160-4.5 MCG/ACT inhaler INHALE 2 PUFFS BY MOUTH TWICE DAILY. 10.2 g 0  . triamcinolone cream (KENALOG) 0.1 % Apply 1 application topically 3 (three) times daily as needed. for itching     No current facility-administered medications for this visit.     Allergies  Allergen Reactions  . Hydrocodone Hives  . Lisinopril     REACTION: Cough  . Pioglitazone     REACTION: Edema  . Varenicline Tartrate     REACTION: bad dreams    Social History   Social History  .  Marital status: Single    Spouse name: N/A  . Number of children: 5  . Years of education: N/A   Occupational History  . Retired    Social History Main Topics  . Smoking status: Current Every Day Smoker    Packs/day: 0.50    Years: 60.00    Types: Cigarettes  . Smokeless tobacco: Never Used  . Alcohol use No  . Drug use: No  . Sexual activity: Not Currently   Other Topics Concern  . Not on file   Social History Narrative   ** Merged History Encounter **         Review of Systems: General: negative for chills, fever, night sweats or weight changes.  Cardiovascular: negative for chest pain, dyspnea on exertion, edema, orthopnea, palpitations, paroxysmal nocturnal dyspnea or shortness of breath Dermatological: negative for rash Respiratory: negative for cough or wheezing Urologic: negative for hematuria Abdominal: negative for nausea, vomiting, diarrhea, bright red blood per rectum, melena, or hematemesis Neurologic: negative for visual changes, syncope, or dizziness All other systems reviewed and are otherwise negative except as noted above.    Blood pressure (!) 174/82, pulse 76, height 6' (1.829 m), weight (!) 353 lb (160.1 kg).  General appearance: alert and no distress Neck: no adenopathy, no carotid bruit, no JVD, supple, symmetrical, trachea midline and thyroid not enlarged, symmetric, no tenderness/mass/nodules Lungs: clear to auscultation bilaterally Heart: regular rate and rhythm, S1, S2 normal, no murmur, click, rub or gallop Extremities: extremities normal, atraumatic, no cyanosis or edema  EKG normal sinus rhythm at 76 with poor R-wave progression. I personally reviewed this EKG  ASSESSMENT AND PLAN:   Chest pain [name] has complaining of off-and-on chest pain for last 3 or 4 months. She does have risk factors included treated diabetes, hypertension and hyperlipidemia as well as a 100-pack-year history of tobacco abuse. She is essentially  wheelchair-bound. There is a question of a normal heart cath 2011. I'm going to get a 2-D echocardiogram and a daily Lexiscan  Myoview stress test because of her size and body habitus to rule out an ischemic etiology.  Essential hypertension History of hypertension blood pressure measures 174/82. She is on labetalol, losartan and hydrochlorothiazide. Continue current meds at current dosing  HLD (hyperlipidemia) History of hyperlipidemia on statin therapy with recent lipid profile performed 09/17/15 revealed total cholesterol 156, LDL 86 and HDL of [place] MD [facility] 01/06/2016 3:31 PM

## 2016-01-06 NOTE — Patient Instructions (Signed)
Medication Instructions:  NO CHANGES.   Testing/Procedures: Your physician has requested that you have an echocardiogram. Echocardiography is a painless test that uses sound waves to create images of your heart. It provides your doctor with information about the size and shape of your heart and how well your heart's chambers and valves are working. This procedure takes approximately one hour. There are no restrictions for this procedure.  Your physician has requested that you have a lexiscan myoview. For further information please visit [email or web address]. Please follow instruction sheet, as given.   2 DAY     Follow-Up: Your physician recommends that you schedule a follow-up appointment in: 3-4 WEEKS WITH DR BERRY FOLLOWING TESTING.   Any Other Special Instructions Will Be Listed Below (If Applicable). Pharmacologic Stress Electrocardiogram A pharmacologic stress electrocardiogram is a heart (cardiac) test that uses nuclear imaging to evaluate the blood supply to your heart. This test may also be called a pharmacologic stress electrocardiography. Pharmacologic means that a medicine is used to increase your heart rate and blood pressure.  This stress test is done to find areas of poor blood flow to the heart by determining the extent of coronary artery disease (CAD). Some people exercise on a treadmill, which naturally increases the blood flow to the heart. For those people unable to exercise on a treadmill, a medicine is used. This medicine stimulates your heart and will cause your heart to beat harder and more quickly, as if you were exercising.  Pharmacologic stress tests can help determine:  The adequacy of blood flow to your heart during increased levels of activity in order to clear you for discharge home.  The extent of coronary artery blockage caused by CAD.  Your prognosis if you have suffered a heart attack.  The effectiveness of cardiac procedures done, such as an angioplasty,  which can increase the circulation in your coronary arteries.  Causes of chest pain or pressure. LET [facility] CARE PROVIDER KNOW ABOUT:  Any allergies you have.  All medicines you are taking, including vitamins, herbs, eye drops, creams, and over-the-counter medicines.  Previous problems you or members of your family have had with the use of anesthetics.  Any blood disorders you have.  Previous surgeries you have had.  Medical conditions you have.  Possibility of pregnancy, if this applies.  If you are currently breastfeeding. RISKS AND COMPLICATIONS Generally, this is a safe procedure. However, as with any procedure, complications can occur. Possible complications include:  You develop pain or pressure in the following areas:  Chest.  Jaw or neck.  Between your shoulder blades.  Radiating down your left arm.  Headache.  Dizziness or light-headedness.  Shortness of breath.  Increased or irregular heartbeat.  Low blood pressure.  Nausea or vomiting.  Flushing.  Redness going up the arm and slight pain during injection of medicine.  Heart attack (rare). BEFORE THE PROCEDURE   Avoid all forms of caffeine for 24 hours before your test or as directed by your health care provider. This includes coffee, tea (even decaffeinated tea), caffeinated sodas, chocolate, cocoa, and certain pain medicines.  Follow your health care provider's instructions regarding eating and drinking before the test.  Take your medicines as directed at regular times with water unless instructed otherwise. Exceptions may include:  If you have diabetes, ask how you are to take your insulin or pills. It is common to adjust insulin dosing the morning of the test.  If you are taking beta-blocker medicines, it is important to  talk to your health care provider about these medicines well before the date of your test. Taking beta-blocker medicines may interfere with the test. In some cases, these  medicines need to be changed or stopped 24 hours or more before the test.  If you wear a nitroglycerin patch, it may need to be removed prior to the test. Ask your health care provider if the patch should be removed before the test.  If you use an inhaler for any breathing condition, bring it with you to the test.  If you are an outpatient, bring a snack so you can eat right after the stress phase of the test.  Do not smoke for 4 hours prior to the test or as directed by your health care provider.  Do not apply lotions, powders, creams, or oils on your chest prior to the test.  Wear comfortable shoes and clothing. Let your health care provider know if you were unable to complete or follow the preparations for your test. PROCEDURE   Multiple patches (electrodes) will be put on your chest. If needed, small areas of your chest may be shaved to get better contact with the electrodes. Once the electrodes are attached to your body, multiple wires will be attached to the electrodes, and your heart rate will be monitored.  An IV access will be started. A nuclear trace (isotope) is given. The isotope may be given intravenously, or it may be swallowed. Nuclear refers to several types of radioactive isotopes, and the nuclear isotope lights up the arteries so that the nuclear images are clear. The isotope is absorbed by your body. This results in low radiation exposure.  A resting nuclear image is taken to show how your heart functions at rest.  A medicine is given through the IV access.  A second scan is done about 1 hour after the medicine injection and determines how your heart functions under stress.  During this stress phase, you will be connected to an electrocardiogram machine. Your blood pressure and oxygen levels will be monitored. AFTER THE PROCEDURE   Your heart rate and blood pressure will be monitored after the test.  You may return to your normal schedule, including diet,activities,  and medicines, unless your health care provider tells you otherwise.   This information is not intended to replace advice given to you by your health care provider. Make sure you discuss any questions you have with your health care provider.   Document Released: 08/22/2008 Document Revised: 04/10/2013 Document Reviewed: 12/11/2012 Elsevier Interactive Patient Education [employer].     If you need a refill on your cardiac medications before your next appointment, please call your pharmacy.

## 2016-01-06 NOTE — Assessment & Plan Note (Signed)
History of hypertension blood pressure measures 174/82. She is on labetalol, losartan and hydrochlorothiazide. Continue current meds at current dosing

## 2016-01-06 NOTE — Assessment & Plan Note (Signed)
[name] has complaining of off-and-on chest pain for last 3 or 4 months. She does have risk factors included treated diabetes, hypertension and hyperlipidemia as well as a 100-pack-year history of tobacco abuse. She is essentially wheelchair-bound. There is a question of a normal heart cath 2011. I'm going to get a 2-D echocardiogram and a daily Lexiscan  Myoview stress test because of her size and body habitus to rule out an ischemic etiology.

## 2016-01-06 NOTE — Addendum Note (Signed)
Addended by: Zebedee Iba on: 01/06/2016 03:52 PM   Modules accepted: Orders

## 2016-01-07 NOTE — Addendum Note (Signed)
Addended by: Vanessa Ralphs on: 01/07/2016 04:35 PM   Modules accepted: Orders

## 2016-01-08 ENCOUNTER — Encounter: Payer: Self-pay | Admitting: Podiatry

## 2016-01-08 ENCOUNTER — Ambulatory Visit (INDEPENDENT_AMBULATORY_CARE_PROVIDER_SITE_OTHER): Payer: Medicare Other | Admitting: Podiatry

## 2016-01-08 VITALS — BP 163/89 | HR 90 | Resp 16

## 2016-01-08 DIAGNOSIS — L89891 Pressure ulcer of other site, stage 1: Secondary | ICD-10-CM

## 2016-01-08 DIAGNOSIS — L97529 Non-pressure chronic ulcer of other part of left foot with unspecified severity: Principal | ICD-10-CM

## 2016-01-08 DIAGNOSIS — E11621 Type 2 diabetes mellitus with foot ulcer: Secondary | ICD-10-CM

## 2016-01-08 NOTE — Progress Notes (Signed)
She presents today for follow-up of an ulceration to the medial aspect of the first metatarsophalangeal joint of the left foot. She states it seems to be doing much better.  Objective: Vital signs are stable she is alert and oriented 3. Pulses are palpable. Ulceration appears to be healing nicely. Reactive hyperkeratosis was debrided today to bleeding does demonstrate epithelial laceration but no signs of infection.  Assessment: Well-healing ulceration medial aspect first metatarsophalangeal joint left.  Plan: Redress today dressing compressive dressing continue to keep this dry and utilize the Bactroban ointment daily. I will follow up with her in 1 month.

## 2016-01-14 ENCOUNTER — Other Ambulatory Visit: Payer: Self-pay | Admitting: Endocrinology

## 2016-01-20 ENCOUNTER — Telehealth (HOSPITAL_COMMUNITY): Payer: Self-pay

## 2016-01-20 ENCOUNTER — Other Ambulatory Visit (HOSPITAL_COMMUNITY): Payer: Medicare Other

## 2016-01-20 NOTE — Telephone Encounter (Signed)
Encounter complete. 

## 2016-01-21 ENCOUNTER — Other Ambulatory Visit: Payer: Self-pay | Admitting: Endocrinology

## 2016-01-22 ENCOUNTER — Inpatient Hospital Stay (HOSPITAL_COMMUNITY): Admission: RE | Admit: 2016-01-22 | Payer: Medicare Other | Source: Ambulatory Visit

## 2016-01-23 ENCOUNTER — Inpatient Hospital Stay (HOSPITAL_COMMUNITY): Admission: RE | Admit: 2016-01-23 | Payer: Medicare Other | Source: Ambulatory Visit

## 2016-01-28 ENCOUNTER — Ambulatory Visit (HOSPITAL_COMMUNITY): Payer: Medicare Other | Attending: Cardiovascular Disease

## 2016-01-28 ENCOUNTER — Other Ambulatory Visit: Payer: Self-pay

## 2016-01-28 DIAGNOSIS — I10 Essential (primary) hypertension: Secondary | ICD-10-CM | POA: Diagnosis not present

## 2016-01-28 DIAGNOSIS — I209 Angina pectoris, unspecified: Secondary | ICD-10-CM | POA: Insufficient documentation

## 2016-01-28 DIAGNOSIS — I5030 Unspecified diastolic (congestive) heart failure: Secondary | ICD-10-CM | POA: Diagnosis not present

## 2016-01-28 DIAGNOSIS — R079 Chest pain, unspecified: Secondary | ICD-10-CM | POA: Diagnosis not present

## 2016-01-29 ENCOUNTER — Encounter (HOSPITAL_COMMUNITY): Payer: Self-pay | Admitting: Emergency Medicine

## 2016-01-29 ENCOUNTER — Emergency Department (HOSPITAL_COMMUNITY): Payer: Medicare Other

## 2016-01-29 ENCOUNTER — Other Ambulatory Visit: Payer: Self-pay

## 2016-01-29 ENCOUNTER — Emergency Department (HOSPITAL_COMMUNITY)
Admission: EM | Admit: 2016-01-29 | Discharge: 2016-01-29 | Disposition: A | Discharge status: Home / Self Care | Payer: Medicare Other | Attending: Emergency Medicine | Admitting: Emergency Medicine

## 2016-01-29 DIAGNOSIS — I5032 Chronic diastolic (congestive) heart failure: Secondary | ICD-10-CM | POA: Insufficient documentation

## 2016-01-29 DIAGNOSIS — R1011 Right upper quadrant pain: Secondary | ICD-10-CM | POA: Diagnosis present

## 2016-01-29 DIAGNOSIS — F17210 Nicotine dependence, cigarettes, uncomplicated: Secondary | ICD-10-CM | POA: Diagnosis not present

## 2016-01-29 DIAGNOSIS — I110 Hypertensive heart disease with heart failure: Secondary | ICD-10-CM | POA: Diagnosis not present

## 2016-01-29 DIAGNOSIS — I252 Old myocardial infarction: Secondary | ICD-10-CM | POA: Diagnosis not present

## 2016-01-29 DIAGNOSIS — K8020 Calculus of gallbladder without cholecystitis without obstruction: Secondary | ICD-10-CM | POA: Diagnosis not present

## 2016-01-29 DIAGNOSIS — Z8673 Personal history of transient ischemic attack (TIA), and cerebral infarction without residual deficits: Secondary | ICD-10-CM | POA: Diagnosis not present

## 2016-01-29 DIAGNOSIS — E1143 Type 2 diabetes mellitus with diabetic autonomic (poly)neuropathy: Secondary | ICD-10-CM | POA: Diagnosis not present

## 2016-01-29 DIAGNOSIS — J189 Pneumonia, unspecified organism: Secondary | ICD-10-CM | POA: Diagnosis not present

## 2016-01-29 DIAGNOSIS — R1032 Left lower quadrant pain: Secondary | ICD-10-CM

## 2016-01-29 DIAGNOSIS — J449 Chronic obstructive pulmonary disease, unspecified: Secondary | ICD-10-CM | POA: Insufficient documentation

## 2016-01-29 DIAGNOSIS — Z79899 Other long term (current) drug therapy: Secondary | ICD-10-CM | POA: Insufficient documentation

## 2016-01-29 DIAGNOSIS — Z794 Long term (current) use of insulin: Secondary | ICD-10-CM | POA: Insufficient documentation

## 2016-01-29 DIAGNOSIS — Z7982 Long term (current) use of aspirin: Secondary | ICD-10-CM | POA: Diagnosis not present

## 2016-01-29 LAB — CBC WITH DIFFERENTIAL/PLATELET
BASOS ABS: 0 10*3/uL (ref 0.0–0.1)
Basophils Relative: 0 %
Eosinophils Absolute: 0 10*3/uL (ref 0.0–0.7)
Eosinophils Relative: 0 %
HEMATOCRIT: 44 % (ref 36.0–46.0)
HEMOGLOBIN: 14.5 g/dL (ref 12.0–15.0)
LYMPHS PCT: 23 %
Lymphs Abs: 2.2 10*3/uL (ref 0.7–4.0)
MCH: 31.9 pg (ref 26.0–34.0)
MCHC: 33 g/dL (ref 30.0–36.0)
MCV: 96.9 fL (ref 78.0–100.0)
MONO ABS: 0.6 10*3/uL (ref 0.1–1.0)
MONOS PCT: 6 %
NEUTROS ABS: 6.5 10*3/uL (ref 1.7–7.7)
Neutrophils Relative %: 71 %
Platelets: 212 10*3/uL (ref 150–400)
RBC: 4.54 MIL/uL (ref 3.87–5.11)
RDW: 12.7 % (ref 11.5–15.5)
WBC: 9.3 10*3/uL (ref 4.0–10.5)

## 2016-01-29 LAB — COMPREHENSIVE METABOLIC PANEL
ALBUMIN: 3.3 g/dL — AB (ref 3.5–5.0)
ALT: 11 U/L — ABNORMAL LOW (ref 14–54)
ANION GAP: 11 (ref 5–15)
AST: 19 U/L (ref 15–41)
Alkaline Phosphatase: 97 U/L (ref 38–126)
BUN: 23 mg/dL — ABNORMAL HIGH (ref 6–20)
CO2: 30 mmol/L (ref 22–32)
Calcium: 9.3 mg/dL (ref 8.9–10.3)
Chloride: 90 mmol/L — ABNORMAL LOW (ref 101–111)
Creatinine, Ser: 1.45 mg/dL — ABNORMAL HIGH (ref 0.44–1.00)
GFR calc Af Amer: 40 mL/min — ABNORMAL LOW (ref >60)
GFR calc non Af Amer: 35 mL/min — ABNORMAL LOW (ref >60)
GLUCOSE: 479 mg/dL — AB (ref 65–99)
POTASSIUM: 3.9 mmol/L (ref 3.5–5.1)
SODIUM: 131 mmol/L — AB (ref 135–145)
TOTAL PROTEIN: 6.8 g/dL (ref 6.5–8.1)
Total Bilirubin: 0.5 mg/dL (ref 0.3–1.2)

## 2016-01-29 LAB — URINALYSIS, ROUTINE W REFLEX MICROSCOPIC
Bilirubin Urine: NEGATIVE
Glucose, UA: 500 mg/dL — AB
Hgb urine dipstick: NEGATIVE
KETONES UR: NEGATIVE mg/dL
LEUKOCYTES UA: NEGATIVE
NITRITE: NEGATIVE
PH: 5.5 (ref 5.0–8.0)
PROTEIN: NEGATIVE mg/dL
Specific Gravity, Urine: 1.016 (ref 1.005–1.030)

## 2016-01-29 LAB — BRAIN NATRIURETIC PEPTIDE: B NATRIURETIC PEPTIDE 5: 20.9 pg/mL (ref 0.0–100.0)

## 2016-01-29 LAB — I-STAT TROPONIN, ED: Troponin i, poc: 0.01 ng/mL (ref 0.00–0.08)

## 2016-01-29 LAB — I-STAT CG4 LACTIC ACID, ED
LACTIC ACID, VENOUS: 4.07 mmol/L — AB (ref 0.5–1.9)
LACTIC ACID, VENOUS: 2.68 mmol/L — AB (ref 0.5–1.9)

## 2016-01-29 LAB — LIPASE, BLOOD: LIPASE: 20 U/L (ref 11–51)

## 2016-01-29 MED ORDER — SODIUM CHLORIDE 0.9 % IV BOLUS (SEPSIS)
1000.0000 mL | Freq: Once | INTRAVENOUS | Status: AC
  Administered 2016-01-29: 1000 mL via INTRAVENOUS

## 2016-01-29 MED ORDER — FENTANYL CITRATE (PF) 100 MCG/2ML IJ SOLN
50.0000 ug | Freq: Once | INTRAMUSCULAR | Status: AC
  Administered 2016-01-29: 50 ug via INTRAVENOUS
  Filled 2016-01-29: qty 2

## 2016-01-29 MED ORDER — IOPAMIDOL (ISOVUE-300) INJECTION 61%
INTRAVENOUS | Status: AC
  Administered 2016-01-29: 75 mL
  Filled 2016-01-29: qty 75

## 2016-01-29 MED ORDER — SODIUM CHLORIDE 0.9 % IV BOLUS (SEPSIS)
250.0000 mL | Freq: Once | INTRAVENOUS | Status: AC
  Administered 2016-01-29: 250 mL via INTRAVENOUS

## 2016-01-29 MED ORDER — TRAMADOL HCL 50 MG PO TABS: 50.0000 mg | ORAL_TABLET | Freq: Four times a day (QID) | ORAL | 0 refills | Status: DC | PRN

## 2016-01-29 MED ORDER — ONDANSETRON HCL 4 MG/2ML IJ SOLN
4.0000 mg | Freq: Once | INTRAMUSCULAR | Status: AC
  Administered 2016-01-29: 4 mg via INTRAVENOUS
  Filled 2016-01-29: qty 2

## 2016-01-29 MED ORDER — MORPHINE SULFATE (PF) 4 MG/ML IV SOLN: 4.0000 mg | Freq: Once | INTRAVENOUS | Status: DC

## 2016-01-29 MED ORDER — FENTANYL CITRATE (PF) 100 MCG/2ML IJ SOLN
25.0000 ug | Freq: Once | INTRAMUSCULAR | Status: AC
  Administered 2016-01-29: 25 ug via INTRAVENOUS
  Filled 2016-01-29: qty 2

## 2016-01-29 MED ORDER — AMOXICILLIN-POT CLAVULANATE 875-125 MG PO TABS: 1.0000 | ORAL_TABLET | Freq: Two times a day (BID) | ORAL | 0 refills | Status: DC

## 2016-01-29 NOTE — ED Notes (Signed)
Pt transported to CT ?

## 2016-01-29 NOTE — ED Notes (Signed)
MD at bedside. 

## 2016-01-29 NOTE — Discharge Instructions (Signed)
Return for severe or worsening symptoms including increased chest pain, cough, fever or abdominal pain
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
## 2016-01-29 NOTE — ED Notes (Signed)
Pt acknowledges discharge teaching and has no questions. Pt is being pushed out in a wheelchair.

## 2016-01-29 NOTE — ED Triage Notes (Signed)
Pt to ER BIB [facility] from home with complaint of RUQ abd pain onset 2 days ago with associated nausea and vomiting. Pt reports inability to keep po medications down, per EMS pt hypertensive in 180's, however on manual bp 98/52, CBG 495. Pt is alert and oriented but reports more short of breath.

## 2016-01-29 NOTE — ED Notes (Signed)
Updated pt and family that we are waiting for MD to come in and interpret CT results.

## 2016-01-29 NOTE — ED Notes (Signed)
Pt transported to [place]. To go to x-ray afterward.

## 2016-01-29 NOTE — ED Notes (Signed)
Pt requesting something to eat/drink. MD informed, sts to wait until after re-assessment.

## 2016-01-30 ENCOUNTER — Ambulatory Visit: Payer: Medicare Other | Admitting: Neurology

## 2016-02-03 LAB — CULTURE, BLOOD (ROUTINE X 2)
CULTURE: NO GROWTH
CULTURE: NO GROWTH

## 2016-02-05 ENCOUNTER — Telehealth (HOSPITAL_COMMUNITY): Payer: Self-pay

## 2016-02-05 NOTE — Telephone Encounter (Signed)
Encounter complete. 

## 2016-02-06 ENCOUNTER — Telehealth (HOSPITAL_COMMUNITY): Payer: Self-pay

## 2016-02-06 NOTE — Telephone Encounter (Signed)
Encounter complete. 

## 2016-02-10 ENCOUNTER — Ambulatory Visit (HOSPITAL_COMMUNITY)
Admission: RE | Admit: 2016-02-10 | Discharge: 2016-02-10 | Disposition: A | Discharge status: Home / Self Care | Payer: Medicare Other | Source: Ambulatory Visit | Attending: Cardiovascular Disease | Admitting: Cardiovascular Disease

## 2016-02-10 ENCOUNTER — Ambulatory Visit: Payer: Medicare Other | Admitting: Cardiovascular Disease

## 2016-02-10 DIAGNOSIS — E669 Obesity, unspecified: Secondary | ICD-10-CM | POA: Insufficient documentation

## 2016-02-10 DIAGNOSIS — Z6841 Body Mass Index (BMI) 40.0 and over, adult: Secondary | ICD-10-CM | POA: Diagnosis not present

## 2016-02-10 DIAGNOSIS — E1122 Type 2 diabetes mellitus with diabetic chronic kidney disease: Secondary | ICD-10-CM | POA: Diagnosis not present

## 2016-02-10 DIAGNOSIS — I209 Angina pectoris, unspecified: Secondary | ICD-10-CM | POA: Diagnosis not present

## 2016-02-10 DIAGNOSIS — I10 Essential (primary) hypertension: Secondary | ICD-10-CM

## 2016-02-10 DIAGNOSIS — Z720 Tobacco use: Secondary | ICD-10-CM | POA: Diagnosis not present

## 2016-02-10 DIAGNOSIS — R0609 Other forms of dyspnea: Secondary | ICD-10-CM | POA: Insufficient documentation

## 2016-02-10 DIAGNOSIS — G4733 Obstructive sleep apnea (adult) (pediatric): Secondary | ICD-10-CM | POA: Insufficient documentation

## 2016-02-10 DIAGNOSIS — Z8249 Family history of ischemic heart disease and other diseases of the circulatory system: Secondary | ICD-10-CM | POA: Insufficient documentation

## 2016-02-10 DIAGNOSIS — R0602 Shortness of breath: Secondary | ICD-10-CM | POA: Insufficient documentation

## 2016-02-10 DIAGNOSIS — N183 Chronic kidney disease, stage 3 (moderate): Secondary | ICD-10-CM | POA: Insufficient documentation

## 2016-02-10 DIAGNOSIS — Z8673 Personal history of transient ischemic attack (TIA), and cerebral infarction without residual deficits: Secondary | ICD-10-CM | POA: Insufficient documentation

## 2016-02-10 DIAGNOSIS — R079 Chest pain, unspecified: Secondary | ICD-10-CM

## 2016-02-10 DIAGNOSIS — I129 Hypertensive chronic kidney disease with stage 1 through stage 4 chronic kidney disease, or unspecified chronic kidney disease: Secondary | ICD-10-CM | POA: Diagnosis not present

## 2016-02-10 MED ORDER — REGADENOSON 0.4 MG/5ML IV SOLN
0.4000 mg | Freq: Once | INTRAVENOUS | Status: AC
  Administered 2016-02-10: 0.4 mg via INTRAVENOUS

## 2016-02-10 MED ORDER — TECHNETIUM TC 99M TETROFOSMIN IV KIT
31.3000 | PACK | Freq: Once | INTRAVENOUS | Status: AC | PRN
  Administered 2016-02-10: 31.3 via INTRAVENOUS
  Filled 2016-02-10: qty 32

## 2016-02-11 ENCOUNTER — Ambulatory Visit (HOSPITAL_COMMUNITY)
Admission: RE | Admit: 2016-02-11 | Discharge: 2016-02-11 | Disposition: A | Discharge status: Home / Self Care | Payer: Medicare Other | Source: Ambulatory Visit | Attending: Cardiovascular Disease | Admitting: Cardiovascular Disease

## 2016-02-11 ENCOUNTER — Institutional Professional Consult (permissible substitution): Payer: Medicare Other | Admitting: Pulmonary Disease

## 2016-02-11 LAB — MYOCARDIAL PERFUSION IMAGING
LV dias vol: 75 mL (ref 46–106)
LV sys vol: 29 mL
Peak HR: 75 {beats}/min
Rest HR: 63 {beats}/min
SDS: 10
SRS: 0
SSS: 10
TID: 0.58

## 2016-02-11 MED ORDER — TECHNETIUM TC 99M TETROFOSMIN IV KIT
29.8000 | PACK | Freq: Once | INTRAVENOUS | Status: AC | PRN
  Administered 2016-02-11: 29.8 via INTRAVENOUS

## 2016-02-13 ENCOUNTER — Ambulatory Visit (HOSPITAL_COMMUNITY): Payer: Medicare Other

## 2016-02-13 ENCOUNTER — Encounter: Payer: Self-pay | Admitting: Cardiovascular Disease

## 2016-02-13 ENCOUNTER — Ambulatory Visit (INDEPENDENT_AMBULATORY_CARE_PROVIDER_SITE_OTHER): Payer: Medicare Other | Admitting: Cardiovascular Disease

## 2016-02-13 ENCOUNTER — Other Ambulatory Visit: Payer: Self-pay | Admitting: Endocrinology

## 2016-02-13 VITALS — BP 140/76 | HR 73 | Ht 72.0 in | Wt 358.0 lb

## 2016-02-13 DIAGNOSIS — I208 Other forms of angina pectoris: Secondary | ICD-10-CM

## 2016-02-13 NOTE — Assessment & Plan Note (Signed)
[name] returns today to review her outpatient tests done in the evaluation of atypical chest pain. Her 2-D echo was normal as was a Myoview stress test. Her pain does not sound anginal. She had a normal cath 6 years ago and several normal stress tests since that time. We have talked about risk factor modification including smoking cessation. I reassured her that I do not think her chest pain is anginal/cardiac in nature. I will see her back when necessary.

## 2016-02-13 NOTE — Patient Instructions (Signed)
Medication Instructions:  Continue current medications.   Follow-Up: Your physician recommends that you schedule a follow-up appointment in: AS NEEDED.

## 2016-02-13 NOTE — Progress Notes (Signed)
[name] returns today to review her outpatient tests done in the evaluation of atypical chest pain. Her 2-D echo was normal as was a Myoview stress test. Her pain does not sound anginal. She had a normal cath 6 years ago and several normal stress tests since that time. We have talked about risk factor modification including smoking cessation. I reassured her that I do not think her chest pain is anginal/cardiac in nature. I will see her back when necessary.

## 2016-02-17 ENCOUNTER — Ambulatory Visit (INDEPENDENT_AMBULATORY_CARE_PROVIDER_SITE_OTHER): Payer: Medicare Other | Admitting: Podiatry

## 2016-02-17 ENCOUNTER — Encounter: Payer: Self-pay | Admitting: Podiatry

## 2016-02-17 DIAGNOSIS — L97521 Non-pressure chronic ulcer of other part of left foot limited to breakdown of skin: Secondary | ICD-10-CM

## 2016-02-17 DIAGNOSIS — E1142 Type 2 diabetes mellitus with diabetic polyneuropathy: Secondary | ICD-10-CM | POA: Diagnosis not present

## 2016-02-17 DIAGNOSIS — E11621 Type 2 diabetes mellitus with foot ulcer: Secondary | ICD-10-CM | POA: Diagnosis not present

## 2016-02-17 DIAGNOSIS — I739 Peripheral vascular disease, unspecified: Secondary | ICD-10-CM | POA: Diagnosis not present

## 2016-02-18 ENCOUNTER — Telehealth: Payer: Self-pay | Admitting: Endocrinology

## 2016-02-18 MED ORDER — OXYCODONE HCL 5 MG PO TABS: ORAL_TABLET | ORAL | 0 refills | Status: DC

## 2016-02-18 NOTE — Telephone Encounter (Signed)
I printed  

## 2016-02-18 NOTE — Progress Notes (Signed)
She presents today vital signs stable alert and oriented 3. Pulses are palpable. She states that she has a spot on her third toe left foot and she was to have a check.  Objective: Vital signs are stable alert and oriented 3. Pulses are palpable. Ulceration to the hallux left is normal medial uneventfully she does have a small blood blister to the lateral and distal lateral aspect of the third digit of the left foot. No complications with this at this time. No signs of infection.  Assessment: Diabetes mellitus with a history of diabetic peripheral neuropathy and angiopathy. History of diabetic ulceration. Current superficial ulceration to the third toe left foot.  Plan: Debrided this area today redressed with rostral compressive dressing this should go on to heal in the next few days quite easily. I will follow-up with her in a couple weeks to make sure that it has healed. She was given specific instructions for soaking and dressing of the toe.

## 2016-02-18 NOTE — Telephone Encounter (Signed)
I contacted the patient's daughter and advised refill is ready for pick up. Rx placed up front for the patient to receive.

## 2016-02-18 NOTE — Telephone Encounter (Signed)
Patient need a refill of oxyCODONE (OXY IR/ROXICODONE) 5 MG immediate release tablet

## 2016-02-18 NOTE — Telephone Encounter (Signed)
See message and please advise, Thanks!  

## 2016-02-27 ENCOUNTER — Ambulatory Visit: Payer: Medicare Other | Admitting: Endocrinology

## 2016-03-02 ENCOUNTER — Ambulatory Visit: Payer: Medicare Other | Admitting: Endocrinology

## 2016-03-04 ENCOUNTER — Ambulatory Visit: Payer: Medicare Other | Admitting: Endocrinology
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
## 2016-03-08 ENCOUNTER — Ambulatory Visit (INDEPENDENT_AMBULATORY_CARE_PROVIDER_SITE_OTHER): Payer: Medicare Other | Admitting: Endocrinology

## 2016-03-08 ENCOUNTER — Ambulatory Visit
Admission: RE | Admit: 2016-03-08 | Discharge: 2016-03-08 | Disposition: A | Discharge status: Home / Self Care | Payer: Medicare Other | Source: Ambulatory Visit | Attending: Endocrinology | Admitting: Endocrinology

## 2016-03-08 ENCOUNTER — Encounter: Payer: Self-pay | Admitting: Endocrinology

## 2016-03-08 VITALS — BP 144/86 | HR 82 | Ht 72.0 in | Wt 361.0 lb

## 2016-03-08 DIAGNOSIS — E118 Type 2 diabetes mellitus with unspecified complications: Secondary | ICD-10-CM

## 2016-03-08 DIAGNOSIS — R9389 Abnormal findings on diagnostic imaging of other specified body structures: Secondary | ICD-10-CM | POA: Insufficient documentation

## 2016-03-08 DIAGNOSIS — R938 Abnormal findings on diagnostic imaging of other specified body structures: Secondary | ICD-10-CM | POA: Diagnosis not present

## 2016-03-08 LAB — POCT GLYCOSYLATED HEMOGLOBIN (HGB A1C): Hemoglobin A1C: 11.7

## 2016-03-08 MED ORDER — VARENICLINE TARTRATE 0.5 MG PO TABS: 0.5000 mg | ORAL_TABLET | Freq: Two times a day (BID) | ORAL | 5 refills | Status: DC

## 2016-03-08 NOTE — Patient Instructions (Addendum)
check your blood sugar twice a day.  vary the time of day when you check, between before the 3 meals, and at bedtime.  also check if you have symptoms of your blood sugar being too high or too low.  please keep a record of the readings and bring it to your next appointment here (or you can bring the meter itself).  You can write it on any piece of paper.  please call us sooner if your blood sugar goes below 70, or if you have a lot of readings over 200.     Please continue the same insulins for now.  Please recheck the chest x-ray today, first floor.  I have sent a prescription to your pharmacy, for chantix, to help you quit smoking.    Please come back for a follow-up appointment in 2 months.

## 2016-03-09 ENCOUNTER — Ambulatory Visit: Payer: Medicare Other | Admitting: Podiatry

## 2016-03-16 ENCOUNTER — Ambulatory Visit: Payer: Medicare Other | Admitting: Podiatry

## 2016-03-19 ENCOUNTER — Telehealth: Payer: Self-pay | Admitting: Endocrinology

## 2016-03-19 MED ORDER — OXYCODONE HCL 5 MG PO TABS: ORAL_TABLET | ORAL | 0 refills | Status: DC

## 2016-03-19 NOTE — Telephone Encounter (Signed)
See message and please advise, Thanks!  

## 2016-03-19 NOTE — Telephone Encounter (Signed)
I printed  

## 2016-03-19 NOTE — Telephone Encounter (Signed)
I contacted the patient's daughter and advised rx is ready for pick up. Rx place up front for the patient to pick up.

## 2016-03-19 NOTE — Telephone Encounter (Signed)
Pt daughter called in and said she needs her refill of her pain medication.

## 2016-03-21 ENCOUNTER — Other Ambulatory Visit: Payer: Self-pay | Admitting: Endocrinology

## 2016-04-15 ENCOUNTER — Telehealth: Payer: Self-pay | Admitting: Endocrinology

## 2016-04-15 MED ORDER — OXYCODONE HCL 5 MG PO TABS: ORAL_TABLET | ORAL | 0 refills | Status: DC

## 2016-04-15 NOTE — Telephone Encounter (Signed)
See message and please advise during Dr. Ellison's absence. Thanks!  

## 2016-04-15 NOTE — Telephone Encounter (Signed)
I contacted the patient's daughter and advised rx is ready for pick up, but rx has been posted dated until 04/19/2016. She voiced understanding and had no further questions at this time.

## 2016-04-15 NOTE — Telephone Encounter (Signed)
Okay 

## 2016-04-15 NOTE — Telephone Encounter (Signed)
Pt is requesting the pain med refill, it will be due on 04/19/16 and we will be closed.  Are you willing to rx for her?

## 2016-04-15 NOTE — Addendum Note (Signed)
Addended by: Verlin Grills T on: 04/15/2016 11:24 AM   Modules accepted: Orders

## 2016-04-16 ENCOUNTER — Other Ambulatory Visit: Payer: Self-pay | Admitting: Endocrinology

## 2016-04-19 ENCOUNTER — Other Ambulatory Visit: Payer: Self-pay | Admitting: Endocrinology

## 2016-04-20 ENCOUNTER — Other Ambulatory Visit: Payer: Self-pay | Admitting: Endocrinology

## 2016-04-25 ENCOUNTER — Other Ambulatory Visit: Payer: Self-pay | Admitting: Endocrinology

## 2016-05-04 ENCOUNTER — Ambulatory Visit (INDEPENDENT_AMBULATORY_CARE_PROVIDER_SITE_OTHER): Payer: Medicare Other | Admitting: Podiatry

## 2016-05-04 ENCOUNTER — Encounter: Payer: Self-pay | Admitting: Podiatry

## 2016-05-04 VITALS — HR 68 | Resp 16

## 2016-05-04 DIAGNOSIS — I739 Peripheral vascular disease, unspecified: Secondary | ICD-10-CM | POA: Diagnosis not present

## 2016-05-04 DIAGNOSIS — L97521 Non-pressure chronic ulcer of other part of left foot limited to breakdown of skin: Secondary | ICD-10-CM | POA: Diagnosis not present

## 2016-05-04 DIAGNOSIS — E11621 Type 2 diabetes mellitus with foot ulcer: Secondary | ICD-10-CM

## 2016-05-04 MED ORDER — MUPIROCIN 2 % EX OINT: TOPICAL_OINTMENT | CUTANEOUS | 2 refills | Status: DC

## 2016-05-05 NOTE — Progress Notes (Signed)
She presents today for follow-up of her ulceration to the second digit of the left foot. She states that her last hemoglobin was A1c was over and 11. She states that nobody really wants to take care of her toe for her and she can't reach it but she does get it wrapped at least once to twice a week.  Objective: Vital signs are stable she is alert and oriented 3 ulcerative lesion plantar aspect of the second digit left foot appears to be healing very nicely was debrided does demonstrate some bleeding. No signs of infection. Redressed today dry sterile compressive dressing.  Assessment: Well-healing ulcerative lesion with a history of peripheral vascular disease and uncontrolled diabetes.  Plan: Redress today debridement today follow up with her in another month or 2. She will call with worsening symptoms.

## 2016-05-07 ENCOUNTER — Ambulatory Visit: Payer: Medicare Other | Admitting: Endocrinology

## 2016-05-17 ENCOUNTER — Other Ambulatory Visit: Payer: Self-pay | Admitting: Endocrinology

## 2016-05-20 ENCOUNTER — Telehealth: Payer: Self-pay | Admitting: Endocrinology

## 2016-05-20 NOTE — Telephone Encounter (Signed)
Refill  oxyCODONE (OXY IR/ROXICODONE) 5 MG immediate release

## 2016-05-21 MED ORDER — OXYCODONE HCL 5 MG PO TABS: 2.5000 mg | ORAL_TABLET | Freq: Three times a day (TID) | ORAL | 0 refills | Status: DC | PRN

## 2016-05-21 NOTE — Telephone Encounter (Signed)
I contacted the patient and advised rx is ready for pick up. Rx placed up front.  

## 2016-05-21 NOTE — Telephone Encounter (Signed)
I printed  

## 2016-05-24 ENCOUNTER — Other Ambulatory Visit: Payer: Self-pay | Admitting: Endocrinology

## 2016-05-26 ENCOUNTER — Ambulatory Visit
Admission: RE | Admit: 2016-05-26 | Discharge: 2016-05-26 | Disposition: A | Discharge status: Home / Self Care | Payer: Medicare Other | Source: Ambulatory Visit | Attending: Endocrinology | Admitting: Endocrinology

## 2016-05-26 ENCOUNTER — Ambulatory Visit (INDEPENDENT_AMBULATORY_CARE_PROVIDER_SITE_OTHER): Payer: Medicare Other | Admitting: Endocrinology

## 2016-05-26 VITALS — BP 128/84 | HR 97 | Temp 98.7°F

## 2016-05-26 DIAGNOSIS — E1122 Type 2 diabetes mellitus with diabetic chronic kidney disease: Secondary | ICD-10-CM | POA: Diagnosis not present

## 2016-05-26 DIAGNOSIS — R112 Nausea with vomiting, unspecified: Secondary | ICD-10-CM

## 2016-05-26 DIAGNOSIS — R05 Cough: Secondary | ICD-10-CM

## 2016-05-26 DIAGNOSIS — R059 Cough, unspecified: Secondary | ICD-10-CM

## 2016-05-26 DIAGNOSIS — N183 Chronic kidney disease, stage 3 unspecified: Secondary | ICD-10-CM

## 2016-05-26 DIAGNOSIS — Z794 Long term (current) use of insulin: Secondary | ICD-10-CM | POA: Diagnosis not present

## 2016-05-26 LAB — CBC WITH DIFFERENTIAL/PLATELET
BASOS ABS: 0.1 10*3/uL (ref 0.0–0.1)
BASOS PCT: 0.7 % (ref 0.0–3.0)
EOS ABS: 0.1 10*3/uL (ref 0.0–0.7)
Eosinophils Relative: 1.6 % (ref 0.0–5.0)
HEMATOCRIT: 42.9 % (ref 36.0–46.0)
Hemoglobin: 14 g/dL (ref 12.0–15.0)
LYMPHS PCT: 34 % (ref 12.0–46.0)
Lymphs Abs: 2.6 10*3/uL (ref 0.7–4.0)
MCHC: 32.6 g/dL (ref 30.0–36.0)
MCV: 97.3 fl (ref 78.0–100.0)
MONO ABS: 0.5 10*3/uL (ref 0.1–1.0)
Monocytes Relative: 6.6 % (ref 3.0–12.0)
Neutro Abs: 4.4 10*3/uL (ref 1.4–7.7)
Neutrophils Relative %: 57.1 % (ref 43.0–77.0)
Platelets: 234 10*3/uL (ref 150.0–400.0)
RBC: 4.4 Mil/uL (ref 3.87–5.11)
RDW: 13.2 % (ref 11.5–15.5)
WBC: 7.7 10*3/uL (ref 4.0–10.5)

## 2016-05-26 LAB — HEPATIC FUNCTION PANEL
ALT: 13 U/L (ref 0–35)
AST: 18 U/L (ref 0–37)
Albumin: 3.5 g/dL (ref 3.5–5.2)
Alkaline Phosphatase: 90 U/L (ref 39–117)
BILIRUBIN DIRECT: 0.1 mg/dL (ref 0.0–0.3)
BILIRUBIN TOTAL: 0.4 mg/dL (ref 0.2–1.2)
TOTAL PROTEIN: 6.9 g/dL (ref 6.0–8.3)

## 2016-05-26 LAB — BASIC METABOLIC PANEL
BUN: 11 mg/dL (ref 6–23)
CALCIUM: 9 mg/dL (ref 8.4–10.5)
CHLORIDE: 99 meq/L (ref 96–112)
CO2: 32 meq/L (ref 19–32)
Creatinine, Ser: 1.09 mg/dL (ref 0.40–1.20)
GFR: 63.01 mL/min (ref >60.00)
Glucose, Bld: 265 mg/dL — ABNORMAL HIGH (ref 70–99)
Potassium: 4.3 mEq/L (ref 3.5–5.1)
SODIUM: 135 meq/L (ref 135–145)

## 2016-05-26 LAB — AMYLASE: Amylase: 31 U/L (ref 27–131)

## 2016-05-26 LAB — POCT GLYCOSYLATED HEMOGLOBIN (HGB A1C): Hemoglobin A1C: 10.9

## 2016-05-26 MED ORDER — OSELTAMIVIR PHOSPHATE 75 MG PO CAPS: 75.0000 mg | ORAL_CAPSULE | Freq: Two times a day (BID) | ORAL | 0 refills | Status: DC

## 2016-05-26 MED ORDER — ONDANSETRON HCL 4 MG PO TABS: 4.0000 mg | ORAL_TABLET | Freq: Three times a day (TID) | ORAL | 0 refills | Status: DC | PRN

## 2016-05-26 MED ORDER — INSULIN GLARGINE 100 UNIT/ML SOLOSTAR PEN: 100.0000 [IU] | PEN_INJECTOR | SUBCUTANEOUS | 99 refills | Status: DC
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
## 2016-05-26 NOTE — Patient Instructions (Addendum)
check your blood sugar twice a day.  vary the time of day when you check, between before the 3 meals, and at bedtime.  also check if you have symptoms of your blood sugar being too high or too low.  please keep a record of the readings and bring it to your next appointment here (or you can bring the meter itself).  You can write it on any piece of paper.  please call us sooner if your blood sugar goes below 70, or if you have a lot of readings over 200.     Please increase the lantus to 100 units each morning, and continue the same humalog.  Please stop taking the oxycodone, due to you drowsiness and nausea.  blood tests and a chest x-ray, are requested for you today.  We'll let you know about the results.   Please come back for a follow-up appointment in 3 months.   I have sent a prescription to your pharmacy, for nausea and flu.  I hope you feel better soon.  If you don't feel better by next week, please call back.  Please call sooner if you get worse.

## 2016-06-17 MED ORDER — DICLOFENAC SODIUM 1 % TD GEL: 4.0000 g | Freq: Four times a day (QID) | TRANSDERMAL | 11 refills | Status: DC

## 2016-06-17 NOTE — Telephone Encounter (Signed)
For your safety, you should go off this medication.  Please tell us where the pain is worse, and I'll be happy to try another medication for this.

## 2016-06-17 NOTE — Telephone Encounter (Signed)
I contacted the patient's daughter and advised we have submitted the prescription for Voltaren gel via voicemail.

## 2016-06-17 NOTE — Telephone Encounter (Signed)
Pt needs pain meds called in please

## 2016-06-17 NOTE — Telephone Encounter (Signed)
I contacted the patient's daughter and advised of message. She voiced understanding and stated the areas she has the most pain is her legs and back. Please advise what alternative medication would be sent. Thanks!

## 2016-06-17 NOTE — Telephone Encounter (Signed)
I have sent a prescription to your pharmacy  

## 2016-07-06 ENCOUNTER — Ambulatory Visit (INDEPENDENT_AMBULATORY_CARE_PROVIDER_SITE_OTHER): Payer: Medicare Other | Admitting: Podiatry

## 2016-07-06 ENCOUNTER — Encounter: Payer: Self-pay | Admitting: Podiatry

## 2016-07-06 DIAGNOSIS — L97421 Non-pressure chronic ulcer of left heel and midfoot limited to breakdown of skin: Secondary | ICD-10-CM

## 2016-07-06 DIAGNOSIS — E11621 Type 2 diabetes mellitus with foot ulcer: Secondary | ICD-10-CM

## 2016-07-06 NOTE — Progress Notes (Signed)
She presents today for follow-up of ulceration to the second toe of the left foot. She states that she also had a new blister pop-up the medial longitudinal arch which is now turned into an ulcer. She states that her blood sugar is about the same which is not good.  Objective: Pulses remain palpable she has a superficial ulceration measuring approximately 5 mm in total diameter with nice clean margins after debridement of all reactive hyperkeratotic tissue. I saw no area of cellulitis no signs of infection. She does have a well-healed ulceration to the second digit of the left foot.  Assessment: Well-healed ulceration left foot. Ulceration medial longitudinal arch left foot.  Plan: I encouraged her to continue to treat this ulcer as she has treated only other ulcers clings cleansing and daily and applying Bactroban ointment and I will follow-up with her in 2-3 weeks. She will watch for signs and symptoms of infection notify us if there are any.

## 2016-07-15 ENCOUNTER — Encounter: Payer: Self-pay | Admitting: Gastroenterology

## 2016-07-27 ENCOUNTER — Ambulatory Visit: Payer: Medicare Other | Admitting: Podiatry

## 2016-07-28 ENCOUNTER — Encounter: Payer: Self-pay | Admitting: Endocrinology

## 2016-07-28 ENCOUNTER — Ambulatory Visit (INDEPENDENT_AMBULATORY_CARE_PROVIDER_SITE_OTHER): Payer: Medicare Other | Admitting: Endocrinology

## 2016-07-28 VITALS — BP 136/84 | HR 89 | Wt 346.0 lb

## 2016-07-28 DIAGNOSIS — Z794 Long term (current) use of insulin: Secondary | ICD-10-CM

## 2016-07-28 DIAGNOSIS — N183 Chronic kidney disease, stage 3 (moderate): Secondary | ICD-10-CM | POA: Diagnosis not present

## 2016-07-28 DIAGNOSIS — E1122 Type 2 diabetes mellitus with diabetic chronic kidney disease: Secondary | ICD-10-CM | POA: Diagnosis not present

## 2016-07-28 DIAGNOSIS — Z0000 Encounter for general adult medical examination without abnormal findings: Secondary | ICD-10-CM | POA: Diagnosis not present

## 2016-07-28 DIAGNOSIS — M810 Age-related osteoporosis without current pathological fracture: Secondary | ICD-10-CM | POA: Diagnosis not present

## 2016-07-28 LAB — POCT GLYCOSYLATED HEMOGLOBIN (HGB A1C): Hemoglobin A1C: 11.2

## 2016-07-28 MED ORDER — DICLOFENAC SODIUM 1 % TD GEL: 4.0000 g | Freq: Four times a day (QID) | TRANSDERMAL | 11 refills | Status: DC

## 2016-07-28 NOTE — Progress Notes (Signed)
we discussed code status.  pt requests full code, but would not want to be started or maintained on artificial life-support measures if there was not a reasonable chance of recovery 
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
## 2016-07-28 NOTE — Patient Instructions (Addendum)
check your blood sugar twice a day.  vary the time of day when you check, between before the 3 meals, and at bedtime.  also check if you have symptoms of your blood sugar being too high or too low.  please keep a record of the readings and bring it to your next appointment here (or you can bring the meter itself).  You can write it on any piece of paper.  please call us sooner if your blood sugar goes below 70, or if you have a lot of readings over 200.     Please increase the lantus to 100 units each morning, and continue the same humalog.   I have sent a prescription to your pharmacy, for the gel, to apply to the painful areas. Please continue the same gabapentin.  Please come back for a follow-up appointment in 3 months.  Please see our dietician the same day.   good diet and exercise significantly improve the control of your diabetes.  please let me know if you wish to be referred to a dietician.  high blood sugar is very risky to your health.  you should see an eye doctor and dentist every year.  It is very important to get all recommended vaccinations.  Please consider these measures for your health:  minimize alcohol.  Do not use tobacco products.  Have a colonoscopy at least every 10 years from age 50.  Women should have an annual mammogram from age 57.  Keep firearms safely stored.  Always use seat belts.  have working smoke alarms in your home.  See an eye doctor and dentist regularly.  Never drive under the influence of alcohol or drugs (including prescription drugs).   It is critically important to prevent falling down (keep floor areas well-lit, dry, and free of loose objects.  If you have a cane, walker, or wheelchair, you should use it, even for short trips around the house.  Wear flat-soled shoes.  Also, try not to rush).

## 2016-08-12 ENCOUNTER — Encounter: Payer: Self-pay | Admitting: Podiatry

## 2016-08-12 ENCOUNTER — Ambulatory Visit (INDEPENDENT_AMBULATORY_CARE_PROVIDER_SITE_OTHER): Payer: Medicare Other | Admitting: Podiatry

## 2016-08-12 DIAGNOSIS — L97421 Non-pressure chronic ulcer of left heel and midfoot limited to breakdown of skin: Secondary | ICD-10-CM

## 2016-08-12 DIAGNOSIS — E11621 Type 2 diabetes mellitus with foot ulcer: Secondary | ICD-10-CM | POA: Diagnosis not present

## 2016-08-12 NOTE — Progress Notes (Signed)
She presents today for follow-up of her ulcerations left foot. She states that seems to be doing better. Her daughter is with her to dress it states this seems to be getting better and it stays clean. Continues to use the Bactroban ointment.  Objective: Vital signs are stable alert and oriented 3. Pulses are palpable. There is no erythema edema cellulitis drainage or odor. Superficial ulceration was debrided demonstrates a 3-4 mm lesion that does not probe deep it is very superficial dermal epidermal junction. No purulence no malodor there is some undermining.  Assessment: Slowly healing ulceration diabetic left foot.  Plan: Continue current therapies and continue this throughout this foot as much as possible. I impressed upon her to keep her sugars under good control and to keep this dressed daily. Follow-up with her in 4-6 weeks.

## 2016-08-14 ENCOUNTER — Other Ambulatory Visit: Payer: Self-pay | Admitting: Endocrinology

## 2016-08-14 NOTE — Telephone Encounter (Signed)
Please d/c potassium, but continue the others.

## 2016-08-23 ENCOUNTER — Ambulatory Visit: Payer: Medicare Other | Admitting: Endocrinology

## 2016-08-31 ENCOUNTER — Encounter: Payer: Medicare Other | Attending: Endocrinology | Admitting: Dietician

## 2016-08-31 ENCOUNTER — Encounter: Payer: Self-pay | Admitting: Dietician

## 2016-08-31 DIAGNOSIS — I130 Hypertensive heart and chronic kidney disease with heart failure and stage 1 through stage 4 chronic kidney disease, or unspecified chronic kidney disease: Secondary | ICD-10-CM | POA: Insufficient documentation

## 2016-08-31 DIAGNOSIS — Z713 Dietary counseling and surveillance: Secondary | ICD-10-CM | POA: Insufficient documentation

## 2016-08-31 DIAGNOSIS — E118 Type 2 diabetes mellitus with unspecified complications: Secondary | ICD-10-CM

## 2016-08-31 DIAGNOSIS — I509 Heart failure, unspecified: Secondary | ICD-10-CM | POA: Diagnosis not present

## 2016-08-31 DIAGNOSIS — E785 Hyperlipidemia, unspecified: Secondary | ICD-10-CM | POA: Diagnosis not present

## 2016-08-31 DIAGNOSIS — Z6841 Body Mass Index (BMI) 40.0 and over, adult: Secondary | ICD-10-CM | POA: Insufficient documentation

## 2016-08-31 DIAGNOSIS — F329 Major depressive disorder, single episode, unspecified: Secondary | ICD-10-CM | POA: Diagnosis not present

## 2016-08-31 DIAGNOSIS — E1122 Type 2 diabetes mellitus with diabetic chronic kidney disease: Secondary | ICD-10-CM | POA: Diagnosis present

## 2016-08-31 DIAGNOSIS — E1151 Type 2 diabetes mellitus with diabetic peripheral angiopathy without gangrene: Secondary | ICD-10-CM | POA: Insufficient documentation

## 2016-08-31 DIAGNOSIS — G4733 Obstructive sleep apnea (adult) (pediatric): Secondary | ICD-10-CM | POA: Insufficient documentation

## 2016-08-31 DIAGNOSIS — K219 Gastro-esophageal reflux disease without esophagitis: Secondary | ICD-10-CM | POA: Diagnosis not present

## 2016-08-31 DIAGNOSIS — N183 Chronic kidney disease, stage 3 (moderate): Secondary | ICD-10-CM | POA: Insufficient documentation

## 2016-08-31 DIAGNOSIS — Z794 Long term (current) use of insulin: Secondary | ICD-10-CM | POA: Insufficient documentation

## 2016-08-31 NOTE — Progress Notes (Signed)
Diabetes Self-Management Education  Visit Type: First/Initial  Appt. Start Time: 1545 Appt. End Time: 1645  08/31/2016  [name], identified by name and date of birth, is a 76 y.o. female with a diagnosis of Diabetes: Type 2.  Other medical hx is extensive and includes CHF, PAD, GERD, HTN, hyperlipidemia, OSA but does not have C-pap "they took it away because I did not use it enough", depression and CKD stage 3.  Patient's daughter lives with her and cares for her but will be moving out soon.  She will continue to take her to her appointments.  Her daughter also has diabetes and paranoid schizophrenia.  Her son and others also live with her.  She states that she has depression often with thoughts of hurting herself at times.  ([facility] crisis card provided.)  She states that family is reason for depression. She does not take care of herself well at times due to depression and may not take medication at those times.  She is only taking 70 units of Lantus daily and 70 units of Humalog daily.  ASSESSMENT  Height 6' (1.829 m), weight (!) 342 lb 8 oz (155.4 kg). Body mass index is 46.45 kg/m.      Diabetes Self-Management Education - 08/31/16 1611      Visit Information   Visit Type First/Initial     Initial Visit   Diabetes Type Type 2   Are you currently following a meal plan? No   Are you taking your medications as prescribed? No   Date Diagnosed 1987     Health Coping   How would you rate your overall health? Good     Psychosocial Assessment   Patient Belief/Attitude about Diabetes Other (comment)  Not good   Self-care barriers Debilitated state due to current medical condition;Lack of material resources;Other (comment);Impaired vision;Unsteady gait/risk for falls  depression, stress   Self-management support Doctor's office;Family   Other persons present Patient;Family Member  daughter and niece   Patient Concerns Nutrition/Meal  planning;Glycemic Control   Special Needs Simplified materials   Preferred Learning Style No preference indicated   Learning Readiness Ready   How often do you need to have someone help you when you read instructions, pamphlets, or other written materials from your doctor or pharmacy? 1 - Never   What is the last grade level you completed in school? 12th grade     Pre-Education Assessment   Patient understands the diabetes disease and treatment process. Needs Review   Patient understands incorporating nutritional management into lifestyle. Needs Review   Patient undertands incorporating physical activity into lifestyle. Needs Review   Patient understands using medications safely. Needs Review   Patient understands monitoring blood glucose, interpreting and using results Needs Review   Patient understands prevention, detection, and treatment of acute complications. Demonstrates understanding / competency   Patient understands prevention, detection, and treatment of chronic complications. Demonstrates understanding / competency   Patient understands how to develop strategies to address psychosocial issues. Needs Review   Patient understands how to develop strategies to promote health/change behavior. Needs Review     Complications   Last HgB A1C per patient/outside source 11.2 %  07/28/16   How often do you check your blood sugar? 3-4 times/day   Fasting Blood glucose range (mg/dL) 70-129   Postprandial Blood glucose range (mg/dL) >200   Number of hypoglycemic episodes per month 0   Number of hyperglycemic episodes per week 21   Can you tell when your  blood sugar is high? Yes   What do you do if your blood sugar is high? take Novolog   Have you had a dilated eye exam in the past 12 months? No   Have you had a dental exam in the past 12 months? No   Are you checking your feet? Yes   How many days per week are you checking your feet? 2     Dietary Intake   Breakfast bacon, eggs,  hashbrowns, toast, coffee with cream and sugar substitute  8   Snack (morning) fruit or raw carrots or celery   Lunch sandwich (ham or [place] or pork chop)  12-1   Snack (afternoon) crackers (cheeze its or chicken flavored cracker)- "sometimes the whole box"   Dinner hot dogs or baked chicken or baked pork or meat loaf, mashed potatoes occasionally, vegetables, pintos, cornbread  6   Snack (evening) PB&J sandwich   Beverage(s) coffee with cream and sugar substitute, 1 cup OJ or apple juice, 2% milk,      Exercise   Exercise Type ADL's   How many days per week to you exercise? 0   How many minutes per day do you exercise? 0   Total minutes per week of exercise 0     Patient Education   Previous Diabetes Education No   Nutrition management  Food label reading, portion sizes and measuring food.;Role of diet in the treatment of diabetes and the relationship between the three main macronutrients and blood glucose level;Meal options for control of blood glucose level and chronic complications.   Physical activity and exercise  Role of exercise on diabetes management, blood pressure control and cardiac health.;Helped patient identify appropriate exercises in relation to his/her diabetes, diabetes complications and other health issue.   Medications Reviewed patients medication for diabetes, action, purpose, timing of dose and side effects.   Monitoring Other (comment)  interpreting A1C   Psychosocial adjustment Worked with patient to identify barriers to care and solutions;Role of stress on diabetes;Brainstormed with patient on coping mechanisms for social situations, getting support from significant others, dealing with feelings about diabetes;Identified and addressed patients feelings and concerns about diabetes   Personal strategies to promote health Lifestyle issues that need to be addressed for better diabetes care     Individualized Goals (developed by patient)   Nutrition General  guidelines for healthy choices and portions discussed   Physical Activity Exercise 3-5 times per week;15 minutes per day   Medications take my medication as prescribed   Monitoring  test my blood glucose as discussed   Problem Solving healthier food choices   Reducing Risk stop smoking;examine blood glucose patterns;do foot checks daily   Health Coping discuss diabetes with (comment)  MD/RD     Post-Education Assessment   Patient understands the diabetes disease and treatment process. Demonstrates understanding / competency   Patient understands incorporating nutritional management into lifestyle. Needs Review   Patient undertands incorporating physical activity into lifestyle. Needs Review   Patient understands using medications safely. Demonstrates understanding / competency   Patient understands monitoring blood glucose, interpreting and using results Demonstrates understanding / competency   Patient understands prevention, detection, and treatment of acute complications. Demonstrates understanding / competency   Patient understands prevention, detection, and treatment of chronic complications. Demonstrates understanding / competency   Patient understands how to develop strategies to address psychosocial issues. Needs Review   Patient understands how to develop strategies to promote health/change behavior. Needs Review     Outcomes  Expected Outcomes Other (comment)  demonstrated interest but multiple barriers   Future DMSE PRN   Program Status Completed      Individualized Plan for Diabetes Self-Management Training:   Learning Objective:  Patient will have a greater understanding of diabetes self-management. Patient education plan is to attend individual and/or group sessions per assessed needs and concerns. Also, encouraged patient to follow a low sodium diet (avoid processed meats).  Plan:   Patient Instructions  Avoid grapefruit. Continue to choose baked most often.    Aim for 2 Carb Choices per meal (30 grams) +/- 1 either way  Aim for 0-1 Carbs per snack if hungry  Include protein in moderation with your meals and snacks Consider reading food labels for Total Carbohydrate and Fat Grams of foods Consider  increasing your activity level by armchair exercises or Silver Sneaker's Class for 30 minutes daily as tolerated Consider checking BG at alternate times per day as directed by MD  Remember to take your medication daily as directed by MD    Per Dr. Cordelia Pen note your lantus dose is 100 units every morning.  Take Novolog with dinner 70 units with dinner. Consider enquiring about C-Pap again.      Expected Outcomes:  Other (comment) (demonstrated interest but multiple barriers)  Education material provided: A1C conversion sheet, Meal plan card, My Plate and Snack sheet  If problems or questions, patient to contact team via:  Phone  Future DSME appointment: PRN

## 2016-08-31 NOTE — Patient Instructions (Addendum)
Avoid grapefruit. Continue to choose baked most often.   Aim for 2 Carb Choices per meal (30 grams) +/- 1 either way  Aim for 0-1 Carbs per snack if hungry  Include protein in moderation with your meals and snacks Consider reading food labels for Total Carbohydrate and Fat Grams of foods Consider  increasing your activity level by armchair exercises or Silver Sneaker's Class for 30 minutes daily as tolerated Consider checking BG at alternate times per day as directed by MD  Remember to take your medication daily as directed by MD    Per Dr. Cordelia Pen note your lantus dose is 100 units every morning.  Take Novolog with dinner 70 units with dinner. Consider enquiring about C-Pap again.

## 2016-09-02 ENCOUNTER — Encounter: Payer: Self-pay | Admitting: Podiatry

## 2016-09-02 ENCOUNTER — Ambulatory Visit (INDEPENDENT_AMBULATORY_CARE_PROVIDER_SITE_OTHER): Payer: Medicare Other | Admitting: Podiatry

## 2016-09-02 DIAGNOSIS — E11621 Type 2 diabetes mellitus with foot ulcer: Secondary | ICD-10-CM | POA: Diagnosis not present

## 2016-09-02 DIAGNOSIS — L97421 Non-pressure chronic ulcer of left heel and midfoot limited to breakdown of skin: Secondary | ICD-10-CM | POA: Diagnosis not present

## 2016-09-02 DIAGNOSIS — I739 Peripheral vascular disease, unspecified: Secondary | ICD-10-CM | POA: Diagnosis not present

## 2016-09-02 DIAGNOSIS — M79676 Pain in unspecified toe(s): Secondary | ICD-10-CM

## 2016-09-02 DIAGNOSIS — B351 Tinea unguium: Secondary | ICD-10-CM | POA: Diagnosis not present

## 2016-09-04 NOTE — Progress Notes (Signed)
She presents today for follow-up of her ulceration plantar aspect of her forefoot left.  Objective: Vital signs are stable she learned oriented 3 ulceration is decreasing in size. No signs of infection rec hyperkeratosis was debrided today demonstrates well-healing ulcer. 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 She has long thick yellow dystrophic, mycotic nails.  Assessment: Pain in limb secondary to diabetic peripheral neuropathy and angiopathy with painful elongated toenails and diabetic ulcer left foot.  Plan: Encouraged her to continue current therapies because she is keeping it clean and it is healing well. Also debrided her nails as well as the wound today. Follow up with her in 3 weeks

## 2016-09-17 ENCOUNTER — Other Ambulatory Visit: Payer: Self-pay | Admitting: Endocrinology

## 2016-10-14 ENCOUNTER — Other Ambulatory Visit: Payer: Self-pay | Admitting: Endocrinology

## 2016-10-27 ENCOUNTER — Ambulatory Visit (INDEPENDENT_AMBULATORY_CARE_PROVIDER_SITE_OTHER): Payer: Medicare Other | Admitting: Endocrinology

## 2016-10-27 VITALS — BP 132/84 | HR 96 | Ht 72.0 in | Wt 345.0 lb

## 2016-10-27 DIAGNOSIS — M545 Low back pain: Secondary | ICD-10-CM

## 2016-10-27 DIAGNOSIS — E118 Type 2 diabetes mellitus with unspecified complications: Secondary | ICD-10-CM | POA: Diagnosis not present

## 2016-10-27 DIAGNOSIS — M810 Age-related osteoporosis without current pathological fracture: Secondary | ICD-10-CM

## 2016-10-27 DIAGNOSIS — G8929 Other chronic pain: Secondary | ICD-10-CM

## 2016-10-27 LAB — HEPATIC FUNCTION PANEL
ALBUMIN: 3.1 g/dL — AB (ref 3.5–5.2)
ALK PHOS: 89 U/L (ref 39–117)
ALT: 8 U/L (ref 0–35)
AST: 10 U/L (ref 0–37)
BILIRUBIN DIRECT: 0.1 mg/dL (ref 0.0–0.3)
TOTAL PROTEIN: 6.2 g/dL (ref 6.0–8.3)
Total Bilirubin: 0.4 mg/dL (ref 0.2–1.2)

## 2016-10-27 LAB — CBC WITH DIFFERENTIAL/PLATELET
BASOS ABS: 0.1 10*3/uL (ref 0.0–0.1)
Basophils Relative: 0.9 % (ref 0.0–3.0)
EOS ABS: 0.1 10*3/uL (ref 0.0–0.7)
Eosinophils Relative: 0.9 % (ref 0.0–5.0)
HCT: 39 % (ref 36.0–46.0)
Hemoglobin: 13.2 g/dL (ref 12.0–15.0)
LYMPHS ABS: 2.1 10*3/uL (ref 0.7–4.0)
Lymphocytes Relative: 28.8 % (ref 12.0–46.0)
MCHC: 33.9 g/dL (ref 30.0–36.0)
MCV: 95.8 fl (ref 78.0–100.0)
MONO ABS: 0.5 10*3/uL (ref 0.1–1.0)
Monocytes Relative: 6.5 % (ref 3.0–12.0)
NEUTROS ABS: 4.5 10*3/uL (ref 1.4–7.7)
NEUTROS PCT: 62.9 % (ref 43.0–77.0)
PLATELETS: 212 10*3/uL (ref 150.0–400.0)
RBC: 4.07 Mil/uL (ref 3.87–5.11)
RDW: 13.2 % (ref 11.5–15.5)
WBC: 7.2 10*3/uL (ref 4.0–10.5)

## 2016-10-27 LAB — BASIC METABOLIC PANEL
BUN: 12 mg/dL (ref 6–23)
CALCIUM: 8.5 mg/dL (ref 8.4–10.5)
CO2: 31 meq/L (ref 19–32)
Chloride: 99 mEq/L (ref 96–112)
Creatinine, Ser: 1.05 mg/dL (ref 0.40–1.20)
GFR: 65.72 mL/min (ref >60.00)
GLUCOSE: 211 mg/dL — AB (ref 70–99)
Potassium: 3.4 mEq/L — ABNORMAL LOW (ref 3.5–5.1)
SODIUM: 136 meq/L (ref 135–145)

## 2016-10-27 LAB — LIPID PANEL
CHOLESTEROL: 149 mg/dL (ref 0–200)
HDL: 25 mg/dL — AB (ref >39.00)
NonHDL: 124.2
TRIGLYCERIDES: 302 mg/dL — AB (ref 0.0–149.0)
Total CHOL/HDL Ratio: 6
VLDL: 60.4 mg/dL — ABNORMAL HIGH (ref 0.0–40.0)

## 2016-10-27 LAB — POCT GLYCOSYLATED HEMOGLOBIN (HGB A1C): Hemoglobin A1C: 11.4

## 2016-10-27 LAB — TSH: TSH: 0.37 u[IU]/mL (ref 0.35–4.50)

## 2016-10-27 LAB — LDL CHOLESTEROL, DIRECT: Direct LDL: 77 mg/dL

## 2016-10-27 LAB — VITAMIN D 25 HYDROXY (VIT D DEFICIENCY, FRACTURES): VITD: 8.25 ng/mL — ABNORMAL LOW (ref 30.00–100.00)

## 2016-10-27 MED ORDER — LIDOCAINE 5 % EX PTCH: 1.0000 | MEDICATED_PATCH | CUTANEOUS | 3 refills | Status: DC

## 2016-10-27 MED ORDER — INSULIN GLARGINE 100 UNIT/ML SOLOSTAR PEN: 120.0000 [IU] | PEN_INJECTOR | SUBCUTANEOUS | 99 refills | Status: DC

## 2016-10-27 MED ORDER — VITAMIN D (ERGOCALCIFEROL) 1.25 MG (50000 UNIT) PO CAPS: 50000.0000 [IU] | ORAL_CAPSULE | ORAL | 0 refills | Status: AC

## 2016-10-27 NOTE — Patient Instructions (Addendum)
Please see a pain specialist.  you will receive a phone call, about a day and time for an appointment. I have sent a prescription to your pharmacy, for a pain patch.   Please continue the same humalog with supper, and:  Increase the lantus to 120 units each morning.  check your blood sugar twice a day.  vary the time of day when you check, between before the 3 meals, and at bedtime.  also check if you have symptoms of your blood sugar being too high or too low.  please keep a record of the readings and bring it to your next appointment here (or you can bring the meter itself).  You can write it on any piece of paper.  please call us sooner if your blood sugar goes below 70, or if you have a lot of readings over 200.  Please come back for a regular physical appointment in 3 months.
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
## 2016-10-28 LAB — PTH, INTACT AND CALCIUM
Calcium: 8.3 mg/dL — ABNORMAL LOW (ref 8.6–10.4)
PTH: 150 pg/mL — AB (ref 14–64)

## 2016-11-03 ENCOUNTER — Telehealth: Payer: Self-pay | Admitting: Endocrinology

## 2016-11-03 NOTE — Telephone Encounter (Signed)
please call patient: [employer] advised for you to see a dietician.  OK with you?

## 2016-11-03 NOTE — Telephone Encounter (Signed)
Patient informed and already sees dietician. Said that they would make an appointment with them so no referral needed.

## 2016-11-11 ENCOUNTER — Telehealth: Payer: Self-pay | Admitting: Endocrinology

## 2016-11-11 NOTE — Telephone Encounter (Signed)
Jake calling w/ [facility], Omega 3 and diabetic testing supply req was faxed in.  Please call back.  Thank you,  -LL

## 2016-11-25 NOTE — Telephone Encounter (Signed)
Routing to you °

## 2016-11-25 NOTE — Telephone Encounter (Signed)
Roosevelt never received an update on the message below and it has been over 2 weeks. Please call [phone] as soon as possible to put through the Omega 3 1000mg  and the diabetic testing supplies. Please advise.

## 2016-11-29 NOTE — Telephone Encounter (Signed)
Called [facility] and they faxing back over the requisition forms to fill out.

## 2016-12-01 NOTE — Telephone Encounter (Signed)
Called [facility] back and they received prescription today.

## 2016-12-05 ENCOUNTER — Other Ambulatory Visit: Payer: Self-pay | Admitting: Endocrinology

## 2016-12-07 ENCOUNTER — Ambulatory Visit: Payer: Medicare Other | Admitting: Podiatry

## 2016-12-10 ENCOUNTER — Other Ambulatory Visit: Payer: Self-pay | Admitting: Endocrinology

## 2016-12-13 ENCOUNTER — Other Ambulatory Visit: Payer: Self-pay | Admitting: Endocrinology

## 2016-12-28 ENCOUNTER — Emergency Department (HOSPITAL_COMMUNITY)
Admission: EM | Admit: 2016-12-28 | Discharge: 2016-12-28 | Disposition: A | Discharge status: Home / Self Care | Payer: Medicare Other | Attending: Emergency Medicine | Admitting: Emergency Medicine

## 2016-12-28 ENCOUNTER — Emergency Department (HOSPITAL_COMMUNITY): Payer: Medicare Other

## 2016-12-28 DIAGNOSIS — Z7982 Long term (current) use of aspirin: Secondary | ICD-10-CM | POA: Insufficient documentation

## 2016-12-28 DIAGNOSIS — K2900 Acute gastritis without bleeding: Secondary | ICD-10-CM

## 2016-12-28 DIAGNOSIS — I110 Hypertensive heart disease with heart failure: Secondary | ICD-10-CM | POA: Diagnosis not present

## 2016-12-28 DIAGNOSIS — F17210 Nicotine dependence, cigarettes, uncomplicated: Secondary | ICD-10-CM | POA: Insufficient documentation

## 2016-12-28 DIAGNOSIS — Z794 Long term (current) use of insulin: Secondary | ICD-10-CM | POA: Insufficient documentation

## 2016-12-28 DIAGNOSIS — I5032 Chronic diastolic (congestive) heart failure: Secondary | ICD-10-CM | POA: Diagnosis not present

## 2016-12-28 DIAGNOSIS — R1012 Left upper quadrant pain: Secondary | ICD-10-CM | POA: Diagnosis present

## 2016-12-28 DIAGNOSIS — E1143 Type 2 diabetes mellitus with diabetic autonomic (poly)neuropathy: Secondary | ICD-10-CM | POA: Diagnosis not present

## 2016-12-28 DIAGNOSIS — K2970 Gastritis, unspecified, without bleeding: Secondary | ICD-10-CM | POA: Diagnosis not present

## 2016-12-28 DIAGNOSIS — J449 Chronic obstructive pulmonary disease, unspecified: Secondary | ICD-10-CM | POA: Insufficient documentation

## 2016-12-28 LAB — COMPREHENSIVE METABOLIC PANEL
ALBUMIN: 3.2 g/dL — AB (ref 3.5–5.0)
ALT: 11 U/L — AB (ref 14–54)
AST: 15 U/L (ref 15–41)
Alkaline Phosphatase: 86 U/L (ref 38–126)
Anion gap: 8 (ref 5–15)
BILIRUBIN TOTAL: 0.6 mg/dL (ref 0.3–1.2)
BUN: 15 mg/dL (ref 6–20)
CHLORIDE: 102 mmol/L (ref 101–111)
CO2: 26 mmol/L (ref 22–32)
CREATININE: 0.95 mg/dL (ref 0.44–1.00)
Calcium: 9.1 mg/dL (ref 8.9–10.3)
GFR calc Af Amer: >60 mL/min (ref >60)
GFR, EST NON AFRICAN AMERICAN: 57 mL/min — AB (ref >60)
GLUCOSE: 316 mg/dL — AB (ref 65–99)
Potassium: 4.1 mmol/L (ref 3.5–5.1)
Sodium: 136 mmol/L (ref 135–145)
Total Protein: 6.5 g/dL (ref 6.5–8.1)

## 2016-12-28 LAB — CBC WITH DIFFERENTIAL/PLATELET
BASOS ABS: 0 10*3/uL (ref 0.0–0.1)
Basophils Relative: 0 %
Eosinophils Absolute: 0.1 10*3/uL (ref 0.0–0.7)
Eosinophils Relative: 1 %
HEMATOCRIT: 40.2 % (ref 36.0–46.0)
Hemoglobin: 13.7 g/dL (ref 12.0–15.0)
LYMPHS PCT: 22 %
Lymphs Abs: 1.9 10*3/uL (ref 0.7–4.0)
MCH: 32.2 pg (ref 26.0–34.0)
MCHC: 34.1 g/dL (ref 30.0–36.0)
MCV: 94.6 fL (ref 78.0–100.0)
Monocytes Absolute: 0.6 10*3/uL (ref 0.1–1.0)
Monocytes Relative: 7 %
NEUTROS ABS: 5.8 10*3/uL (ref 1.7–7.7)
Neutrophils Relative %: 70 %
PLATELETS: 233 10*3/uL (ref 150–400)
RBC: 4.25 MIL/uL (ref 3.87–5.11)
RDW: 12.8 % (ref 11.5–15.5)
WBC: 8.4 10*3/uL (ref 4.0–10.5)

## 2016-12-28 LAB — SALICYLATE LEVEL: Salicylate Lvl: <7 mg/dL (ref 2.8–30.0)

## 2016-12-28 LAB — LIPASE, BLOOD: LIPASE: 27 U/L (ref 11–51)

## 2016-12-28 MED ORDER — IOPAMIDOL (ISOVUE-300) INJECTION 61%
100.0000 mL | Freq: Once | INTRAVENOUS | Status: AC | PRN
  Administered 2016-12-28: 100 mL via INTRAVENOUS

## 2016-12-28 MED ORDER — SODIUM CHLORIDE 0.9 % IV SOLN: INTRAVENOUS | Status: DC

## 2016-12-28 MED ORDER — SODIUM CHLORIDE 0.9 % IV BOLUS (SEPSIS)
1000.0000 mL | Freq: Once | INTRAVENOUS | Status: AC
  Administered 2016-12-28: 1000 mL via INTRAVENOUS

## 2016-12-28 MED ORDER — IOPAMIDOL (ISOVUE-300) INJECTION 61%
INTRAVENOUS | Status: AC
  Filled 2016-12-28: qty 100

## 2016-12-28 MED ORDER — FAMOTIDINE 20 MG PO TABS
40.0000 mg | ORAL_TABLET | Freq: Once | ORAL | Status: AC
Start: 2016-12-28 — End: 2016-12-28
  Administered 2016-12-28: 40 mg via ORAL
  Filled 2016-12-28: qty 2

## 2016-12-28 MED ORDER — DICYCLOMINE HCL 20 MG PO TABS: 20.0000 mg | ORAL_TABLET | Freq: Two times a day (BID) | ORAL | 0 refills | Status: DC

## 2016-12-28 MED ORDER — MORPHINE SULFATE (PF) 4 MG/ML IV SOLN
4.0000 mg | Freq: Once | INTRAVENOUS | Status: AC
  Administered 2016-12-28: 4 mg via INTRAVENOUS
  Filled 2016-12-28 (×2): qty 1

## 2016-12-28 MED ORDER — ONDANSETRON HCL 4 MG/2ML IJ SOLN
4.0000 mg | Freq: Once | INTRAMUSCULAR | Status: AC
  Administered 2016-12-28: 4 mg via INTRAVENOUS
  Filled 2016-12-28: qty 2

## 2016-12-28 MED ORDER — GI COCKTAIL ~~LOC~~
30.0000 mL | Freq: Once | ORAL | Status: AC
  Administered 2016-12-28: 30 mL via ORAL
  Filled 2016-12-28: qty 30

## 2016-12-28 MED ORDER — FAMOTIDINE 20 MG PO TABS: 20.0000 mg | ORAL_TABLET | Freq: Two times a day (BID) | ORAL | 0 refills | Status: DC

## 2016-12-28 MED ORDER — SUCRALFATE 1 G PO TABS: 1.0000 g | ORAL_TABLET | Freq: Four times a day (QID) | ORAL | 0 refills | Status: DC

## 2016-12-28 NOTE — ED Notes (Signed)
Bed: WA17 Expected date:  Expected time:  Means of arrival:  Comments: EMS 

## 2016-12-28 NOTE — ED Triage Notes (Addendum)
Per EMS, pt is coming from home with complaints of LUQ abdominal pain with nausea, vomiting, diarrhea, fever, and chills x1 week. Pt also complaining of generalized weakness. EMS reports cbg 495. Pt received 4 mg ODT zofran from EMS. Pt has a hx of HTN, CHF, and diabetes. Pt AOx4.
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
## 2016-12-28 NOTE — ED Notes (Signed)
Called family for ride

## 2017-01-06 ENCOUNTER — Ambulatory Visit (INDEPENDENT_AMBULATORY_CARE_PROVIDER_SITE_OTHER): Payer: Medicare Other | Admitting: Podiatry

## 2017-01-06 ENCOUNTER — Telehealth: Payer: Self-pay | Admitting: Endocrinology

## 2017-01-06 DIAGNOSIS — B351 Tinea unguium: Secondary | ICD-10-CM | POA: Diagnosis not present

## 2017-01-06 DIAGNOSIS — M79676 Pain in unspecified toe(s): Secondary | ICD-10-CM

## 2017-01-06 DIAGNOSIS — E1142 Type 2 diabetes mellitus with diabetic polyneuropathy: Secondary | ICD-10-CM

## 2017-01-06 NOTE — Telephone Encounter (Signed)
Patient called in reference to needing to know the name of the medication for diabetic nerve pain in her feet. Please call patient and advise.

## 2017-01-06 NOTE — Progress Notes (Signed)
She presents today as a diabetic with a history of diabetic ulcerations peripheral vascular disease and diabetic peripheral neuropathy. She's complaining of painful elongated toenails and neuropathic pain.  Objective: Pulses are palpable. Neurologic sensorium is diminished severely. Hammertoe deformities demonstrate no open lesions or wounds. Toenails are long thick yellow dystrophic mycotic and painful on palpation.  Assessment: Pain in limb secondary to onychomycosis and diabetic peripheral neuropathy.  Plan: She was asking for pain medication for her neuropathy. I explained to her that she is already being treated by Dr. Lyla Glassing with gabapentin. I recommended that she follow-up with him. Follow up with me in 3 months

## 2017-01-07 ENCOUNTER — Telehealth: Payer: Self-pay | Admitting: *Deleted

## 2017-01-07 ENCOUNTER — Other Ambulatory Visit: Payer: Self-pay

## 2017-01-07 NOTE — Telephone Encounter (Signed)
Called and spoke with patient's daughter & stated that gabapentin was medication for diabetic nerve pain.

## 2017-01-07 NOTE — Telephone Encounter (Signed)
Pt states she was seen by Dr. Milinda Pointer today and wanted to know what medication he had wanted her to continue. 01/07/2017-I reviewed clinicals 01/06/2017 and Dr. Milinda Pointer had wanted her to continue the gabapentin prescribed by Dr. Lyla Glassing. Left message with Dr. Stephenie Acres orders.

## 2017-01-11 ENCOUNTER — Other Ambulatory Visit: Payer: Self-pay | Admitting: Endocrinology

## 2017-01-27 ENCOUNTER — Ambulatory Visit: Payer: Medicare Other | Admitting: Endocrinology

## 2017-02-04 ENCOUNTER — Other Ambulatory Visit: Payer: Self-pay | Admitting: Endocrinology

## 2017-02-11 ENCOUNTER — Other Ambulatory Visit: Payer: Self-pay | Admitting: Endocrinology

## 2017-02-15 ENCOUNTER — Encounter: Payer: Self-pay | Admitting: Endocrinology

## 2017-02-15 ENCOUNTER — Ambulatory Visit (INDEPENDENT_AMBULATORY_CARE_PROVIDER_SITE_OTHER): Payer: Medicare Other | Admitting: Endocrinology

## 2017-02-15 VITALS — BP 132/92 | HR 87 | Wt 342.0 lb

## 2017-02-15 DIAGNOSIS — Z0000 Encounter for general adult medical examination without abnormal findings: Secondary | ICD-10-CM

## 2017-02-15 DIAGNOSIS — G894 Chronic pain syndrome: Secondary | ICD-10-CM

## 2017-02-15 DIAGNOSIS — E1122 Type 2 diabetes mellitus with diabetic chronic kidney disease: Secondary | ICD-10-CM

## 2017-02-15 DIAGNOSIS — N189 Chronic kidney disease, unspecified: Secondary | ICD-10-CM | POA: Diagnosis not present

## 2017-02-15 DIAGNOSIS — N289 Disorder of kidney and ureter, unspecified: Secondary | ICD-10-CM

## 2017-02-15 DIAGNOSIS — N183 Chronic kidney disease, stage 3 (moderate): Principal | ICD-10-CM

## 2017-02-15 DIAGNOSIS — Z794 Long term (current) use of insulin: Secondary | ICD-10-CM

## 2017-02-15 DIAGNOSIS — Z23 Encounter for immunization: Secondary | ICD-10-CM | POA: Diagnosis not present

## 2017-02-15 DIAGNOSIS — E559 Vitamin D deficiency, unspecified: Secondary | ICD-10-CM | POA: Diagnosis not present

## 2017-02-15 LAB — BASIC METABOLIC PANEL
BUN: 9 mg/dL (ref 6–23)
CALCIUM: 9.4 mg/dL (ref 8.4–10.5)
CO2: 35 meq/L — AB (ref 19–32)
Chloride: 99 mEq/L (ref 96–112)
Creatinine, Ser: 0.82 mg/dL (ref 0.40–1.20)
GFR: 87.34 mL/min (ref >60.00)
GLUCOSE: 301 mg/dL — AB (ref 70–99)
Potassium: 4.4 mEq/L (ref 3.5–5.1)
Sodium: 138 mEq/L (ref 135–145)

## 2017-02-15 LAB — POCT GLYCOSYLATED HEMOGLOBIN (HGB A1C): Hemoglobin A1C: 8.7

## 2017-02-15 LAB — VITAMIN D 25 HYDROXY (VIT D DEFICIENCY, FRACTURES): VITD: 20.11 ng/mL — ABNORMAL LOW (ref 30.00–100.00)

## 2017-02-15 MED ORDER — ERGOCALCIFEROL 1.25 MG (50000 UT) PO CAPS: 50000.0000 [IU] | ORAL_CAPSULE | ORAL | 0 refills | Status: DC

## 2017-02-15 MED ORDER — NICOTINE 21 MG/24HR TD PT24: 21.0000 mg | MEDICATED_PATCH | Freq: Every day | TRANSDERMAL | 5 refills | Status: DC

## 2017-02-15 MED ORDER — INSULIN LISPRO 100 UNIT/ML (KWIKPEN)
60.0000 [IU] | PEN_INJECTOR | Freq: Every day | SUBCUTANEOUS | 11 refills | Status: DC
Start: 2017-02-15 — End: 2017-06-29
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
## 2017-02-15 NOTE — Patient Instructions (Addendum)
Please continue the same humalog (not 75/25), with supper, and:  Increase the lantus to 120 units each morning.  Please see a pain specialist.  you will receive a phone call, about a day and time for an appointment check your blood sugar twice a day.  vary the time of day when you check, between before the 3 meals, and at bedtime.  also check if you have symptoms of your blood sugar being too high or too low.  please keep a record of the readings and bring it to your next appointment here (or you can bring the meter itself).  You can write it on any piece of paper.  please call us sooner if your blood sugar goes below 70, or if you have a lot of readings over 200.  Please consider these measures for your health:  minimize alcohol.  Do not use tobacco products.  Have a colonoscopy at least every 10 years from age 77.  Women should have an annual mammogram from age 52.  Keep firearms safely stored.  Always use seat belts.  have working smoke alarms in your home.  See an eye doctor and dentist regularly.  Never drive under the influence of alcohol or drugs (including prescription drugs).   please let me know what your wishes would be, if artificial life support measures should become necessary.  It is critically important to prevent falling down (keep floor areas well-lit, dry, and free of loose objects.  If you have a cane, walker, or wheelchair, you should use it, even for short trips around the house.  Wear flat-soled shoes.  Also, try not to rush).   blood tests are requested for you today.  We'll let you know about the results. I have sent a prescription to your pharmacy, for the nicotine patches.   Please come back for a follow-up appointment in 3 months.

## 2017-02-17 LAB — MICROALBUMIN / CREATININE URINE RATIO
CREATININE, U: 270.6 mg/dL
Microalb Creat Ratio: 6.9 mg/g (ref 0.0–30.0)
Microalb, Ur: 18.8 mg/dL — ABNORMAL HIGH (ref 0.0–1.9)

## 2017-03-01 ENCOUNTER — Telehealth: Payer: Self-pay

## 2017-03-01 NOTE — Telephone Encounter (Signed)
Called and spoke with patient's daughter & informed her that paperwork was ready to be picked up in office.

## 2017-03-14 ENCOUNTER — Other Ambulatory Visit: Payer: Self-pay | Admitting: Endocrinology

## 2017-04-07 ENCOUNTER — Ambulatory Visit: Payer: Medicare Other | Admitting: Podiatry

## 2017-04-25 ENCOUNTER — Telehealth: Payer: Self-pay | Admitting: Endocrinology

## 2017-04-25 ENCOUNTER — Encounter: Payer: Self-pay | Admitting: Endocrinology

## 2017-04-25 ENCOUNTER — Ambulatory Visit
Admission: RE | Admit: 2017-04-25 | Discharge: 2017-04-25 | Disposition: A | Discharge status: Home / Self Care | Payer: Medicare Other | Source: Ambulatory Visit | Attending: Endocrinology | Admitting: Endocrinology

## 2017-04-25 ENCOUNTER — Ambulatory Visit (INDEPENDENT_AMBULATORY_CARE_PROVIDER_SITE_OTHER): Payer: Medicare Other | Admitting: Endocrinology

## 2017-04-25 ENCOUNTER — Other Ambulatory Visit: Payer: Self-pay | Admitting: Endocrinology

## 2017-04-25 VITALS — BP 122/80 | HR 72 | Wt 350.4 lb

## 2017-04-25 DIAGNOSIS — R059 Cough, unspecified: Secondary | ICD-10-CM

## 2017-04-25 DIAGNOSIS — E118 Type 2 diabetes mellitus with unspecified complications: Secondary | ICD-10-CM | POA: Diagnosis not present

## 2017-04-25 DIAGNOSIS — R05 Cough: Secondary | ICD-10-CM

## 2017-04-25 DIAGNOSIS — R609 Edema, unspecified: Secondary | ICD-10-CM | POA: Diagnosis not present

## 2017-04-25 DIAGNOSIS — R112 Nausea with vomiting, unspecified: Secondary | ICD-10-CM

## 2017-04-25 LAB — CBC WITH DIFFERENTIAL/PLATELET
BASOS PCT: 0.7 % (ref 0.0–3.0)
Basophils Absolute: 0 10*3/uL (ref 0.0–0.1)
EOS ABS: 0.1 10*3/uL (ref 0.0–0.7)
Eosinophils Relative: 1 % (ref 0.0–5.0)
HEMATOCRIT: 41.8 % (ref 36.0–46.0)
Hemoglobin: 13.7 g/dL (ref 12.0–15.0)
LYMPHS PCT: 33.3 % (ref 12.0–46.0)
Lymphs Abs: 2.5 10*3/uL (ref 0.7–4.0)
MCHC: 32.7 g/dL (ref 30.0–36.0)
MCV: 98.3 fl (ref 78.0–100.0)
MONOS PCT: 7 % (ref 3.0–12.0)
Monocytes Absolute: 0.5 10*3/uL (ref 0.1–1.0)
Neutro Abs: 4.3 10*3/uL (ref 1.4–7.7)
Neutrophils Relative %: 58 % (ref 43.0–77.0)
Platelets: 214 10*3/uL (ref 150.0–400.0)
RBC: 4.26 Mil/uL (ref 3.87–5.11)
RDW: 13.8 % (ref 11.5–15.5)
WBC: 7.4 10*3/uL (ref 4.0–10.5)

## 2017-04-25 LAB — HEPATIC FUNCTION PANEL
ALT: 11 U/L (ref 0–35)
AST: 13 U/L (ref 0–37)
Albumin: 3.4 g/dL — ABNORMAL LOW (ref 3.5–5.2)
Alkaline Phosphatase: 85 U/L (ref 39–117)
BILIRUBIN TOTAL: 0.3 mg/dL (ref 0.2–1.2)
Bilirubin, Direct: 0.1 mg/dL (ref 0.0–0.3)
Total Protein: 6.4 g/dL (ref 6.0–8.3)

## 2017-04-25 LAB — BASIC METABOLIC PANEL
BUN: 12 mg/dL (ref 6–23)
CO2: 31 mEq/L (ref 19–32)
Calcium: 8.7 mg/dL (ref 8.4–10.5)
Chloride: 103 mEq/L (ref 96–112)
Creatinine, Ser: 0.95 mg/dL (ref 0.40–1.20)
GFR: 73.66 mL/min (ref >60.00)
GLUCOSE: 176 mg/dL — AB (ref 70–99)
POTASSIUM: 3.7 meq/L (ref 3.5–5.1)
Sodium: 139 mEq/L (ref 135–145)

## 2017-04-25 LAB — AMYLASE: Amylase: 37 U/L (ref 27–131)

## 2017-04-25 LAB — BRAIN NATRIURETIC PEPTIDE: PRO B NATRI PEPTIDE: 78 pg/mL (ref 0.0–100.0)

## 2017-04-25 LAB — POCT GLYCOSYLATED HEMOGLOBIN (HGB A1C): HEMOGLOBIN A1C: 7.9

## 2017-04-25 MED ORDER — ONDANSETRON HCL 4 MG PO TABS: ORAL_TABLET | ORAL | 5 refills | Status: DC

## 2017-04-25 MED ORDER — OMEPRAZOLE 40 MG PO CPDR: DELAYED_RELEASE_CAPSULE | ORAL | 11 refills | Status: DC

## 2017-04-25 MED ORDER — INSULIN GLARGINE 100 UNIT/ML SOLOSTAR PEN: 100.0000 [IU] | PEN_INJECTOR | SUBCUTANEOUS | 11 refills | Status: DC

## 2017-04-25 MED ORDER — GABAPENTIN 300 MG PO CAPS: 300.0000 mg | ORAL_CAPSULE | Freq: Two times a day (BID) | ORAL | 11 refills | Status: DC

## 2017-04-25 NOTE — Telephone Encounter (Signed)
Pt is scheduled °

## 2017-04-25 NOTE — Telephone Encounter (Signed)
I called patient daughter & she did want her mom to come in today at 1:45. I will get her added to schedule.

## 2017-04-25 NOTE — Telephone Encounter (Signed)
Pt has diarrhea with nausea vomiting and no fever now. She did have one in the past she has been dealing with this for about a month. Her body is aching, coughing with yellow sputum.  Pt has been asked to go to the ER by her family but she will not go. Please advise
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
## 2017-04-25 NOTE — Patient Instructions (Addendum)
Please continue the same insulin. blood and urine tests, and a chest xay, are requested for you today.  We'll let you know about the results.  I hope you feel better soon.  If you don't feel better by next week, please call back.  Please go the the hospital if you get worse.   Please hold off the blood pressure medication for now I'll see you next time.

## 2017-04-25 NOTE — Telephone Encounter (Signed)
Ov today 1:45 PM

## 2017-04-29 LAB — URINALYSIS, ROUTINE W REFLEX MICROSCOPIC
BILIRUBIN URINE: NEGATIVE
HGB URINE DIPSTICK: NEGATIVE
KETONES UR: NEGATIVE
LEUKOCYTES UA: NEGATIVE
NITRITE: NEGATIVE
PH: 5 (ref 5.0–8.0)
Specific Gravity, Urine: >=1.03 — AB (ref 1.000–1.030)
Total Protein, Urine: 30 — AB
URINE GLUCOSE: NEGATIVE
UROBILINOGEN UA: 1 (ref 0.0–1.0)

## 2017-04-29 NOTE — Addendum Note (Signed)
Addended by: Kaylyn Lim I on: 04/29/2017 11:34 AM   Modules accepted: Orders

## 2017-05-10 ENCOUNTER — Other Ambulatory Visit: Payer: Self-pay | Admitting: Endocrinology

## 2017-05-17 ENCOUNTER — Telehealth: Payer: Self-pay

## 2017-05-17 NOTE — Telephone Encounter (Signed)
I spoke with patient's daughter & sh gave me the phone number for her brother. He is the one who is submitting the matrix absence request to be filled out by Dr. Loanne Drilling. I called & left him a VM to call back so he could give me he info needed to fill out paperwork.

## 2017-05-18 ENCOUNTER — Ambulatory Visit: Payer: Medicare Other | Admitting: Endocrinology

## 2017-05-27 ENCOUNTER — Encounter: Payer: Self-pay | Admitting: Endocrinology

## 2017-05-27 ENCOUNTER — Ambulatory Visit (INDEPENDENT_AMBULATORY_CARE_PROVIDER_SITE_OTHER): Payer: Medicare Other | Admitting: Endocrinology

## 2017-05-27 VITALS — BP 136/84 | HR 68

## 2017-05-27 DIAGNOSIS — E1122 Type 2 diabetes mellitus with diabetic chronic kidney disease: Secondary | ICD-10-CM

## 2017-05-27 DIAGNOSIS — R0789 Other chest pain: Secondary | ICD-10-CM

## 2017-05-27 DIAGNOSIS — Z794 Long term (current) use of insulin: Secondary | ICD-10-CM

## 2017-05-27 DIAGNOSIS — R1013 Epigastric pain: Secondary | ICD-10-CM | POA: Diagnosis not present

## 2017-05-27 DIAGNOSIS — N183 Chronic kidney disease, stage 3 unspecified: Secondary | ICD-10-CM

## 2017-05-27 LAB — BASIC METABOLIC PANEL
BUN: 14 mg/dL (ref 6–23)
CHLORIDE: 98 meq/L (ref 96–112)
CO2: 33 meq/L — AB (ref 19–32)
CREATININE: 0.86 mg/dL (ref 0.40–1.20)
Calcium: 9 mg/dL (ref 8.4–10.5)
GFR: 82.61 mL/min (ref >60.00)
GLUCOSE: 344 mg/dL — AB (ref 70–99)
Potassium: 3.9 mEq/L (ref 3.5–5.1)
Sodium: 134 mEq/L — ABNORMAL LOW (ref 135–145)

## 2017-05-27 LAB — HEPATIC FUNCTION PANEL
ALK PHOS: 86 U/L (ref 39–117)
ALT: 9 U/L (ref 0–35)
AST: 12 U/L (ref 0–37)
Albumin: 3.3 g/dL — ABNORMAL LOW (ref 3.5–5.2)
BILIRUBIN TOTAL: 0.3 mg/dL (ref 0.2–1.2)
Bilirubin, Direct: 0 mg/dL (ref 0.0–0.3)
Total Protein: 6.7 g/dL (ref 6.0–8.3)

## 2017-05-27 LAB — AMYLASE: AMYLASE: 38 U/L (ref 27–131)

## 2017-05-27 LAB — POCT GLYCOSYLATED HEMOGLOBIN (HGB A1C): HEMOGLOBIN A1C: 9.1

## 2017-05-27 NOTE — Patient Instructions (Addendum)
blood tests are requested for you today.  We'll let you know about the results. Please let us know when you can do the cardiogram at the old office.  Let's check a CT scan.  you will receive a phone call, about a day and time for an appointment.
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
## 2017-05-28 LAB — CBC WITH DIFFERENTIAL/PLATELET
Basophils Absolute: 40 cells/uL (ref 0–200)
Basophils Relative: 0.6 %
EOS ABS: 40 {cells}/uL (ref 15–500)
Eosinophils Relative: 0.6 %
HEMATOCRIT: 43.9 % (ref 35.0–45.0)
Hemoglobin: 14.4 g/dL (ref 11.7–15.5)
LYMPHS ABS: 2138 {cells}/uL (ref 850–3900)
MCH: 31.2 pg (ref 27.0–33.0)
MCHC: 32.8 g/dL (ref 32.0–36.0)
MCV: 95 fL (ref 80.0–100.0)
MPV: 12.4 fL (ref 7.5–12.5)
Monocytes Relative: 7.3 %
NEUTROS PCT: 59.1 %
Neutro Abs: 3901 cells/uL (ref 1500–7800)
Platelets: 212 10*3/uL (ref 140–400)
RBC: 4.62 10*6/uL (ref 3.80–5.10)
RDW: 12.4 % (ref 11.0–15.0)
Total Lymphocyte: 32.4 %
WBC: 6.6 10*3/uL (ref 3.8–10.8)
WBCMIX: 482 {cells}/uL (ref 200–950)

## 2017-05-28 LAB — D-DIMER, QUANTITATIVE: D-Dimer, Quant: 0.59 mcg/mL FEU — ABNORMAL HIGH (ref <0.50)

## 2017-05-28 LAB — TIQ-NTM

## 2017-05-31 ENCOUNTER — Other Ambulatory Visit: Payer: Self-pay | Admitting: Endocrinology

## 2017-05-31 ENCOUNTER — Telehealth: Payer: Self-pay | Admitting: Endocrinology

## 2017-05-31 DIAGNOSIS — R1013 Epigastric pain: Secondary | ICD-10-CM

## 2017-05-31 LAB — URINALYSIS, ROUTINE W REFLEX MICROSCOPIC
Bilirubin Urine: NEGATIVE
Leukocytes, UA: NEGATIVE
Nitrite: NEGATIVE
RBC / HPF: NONE SEEN (ref 0–?)
Specific Gravity, Urine: >=1.03 — AB (ref 1.000–1.030)
Total Protein, Urine: 30 — AB
Urine Glucose: >=1000 — AB
Urobilinogen, UA: 1 (ref 0.0–1.0)
pH: 5.5 (ref 5.0–8.0)

## 2017-05-31 NOTE — Telephone Encounter (Signed)
done

## 2017-05-31 NOTE — Addendum Note (Signed)
Addended by: Kaylyn Lim I on: 05/31/2017 10:22 AM   Modules accepted: Orders

## 2017-05-31 NOTE — Telephone Encounter (Signed)
[facility] called about orders that are in for the pt. They need a change to those orders   It is Ct abdomen with or without  That needs to be change to with,   If theres any questions please call [phone] [facility]

## 2017-06-08 ENCOUNTER — Inpatient Hospital Stay (HOSPITAL_COMMUNITY)
Admission: EM | Admit: 2017-06-08 | Discharge: 2017-06-12 | DRG: 871 | Disposition: A | Discharge status: Home Health | Payer: Medicare Other | Attending: Family Medicine | Admitting: Family Medicine

## 2017-06-08 ENCOUNTER — Inpatient Hospital Stay (HOSPITAL_COMMUNITY): Payer: Medicare Other

## 2017-06-08 ENCOUNTER — Emergency Department (HOSPITAL_COMMUNITY): Payer: Medicare Other

## 2017-06-08 ENCOUNTER — Telehealth: Payer: Self-pay | Admitting: Endocrinology

## 2017-06-08 ENCOUNTER — Other Ambulatory Visit: Payer: Self-pay

## 2017-06-08 ENCOUNTER — Encounter (HOSPITAL_COMMUNITY): Payer: Self-pay | Admitting: Emergency Medicine

## 2017-06-08 DIAGNOSIS — K219 Gastro-esophageal reflux disease without esophagitis: Secondary | ICD-10-CM | POA: Diagnosis present

## 2017-06-08 DIAGNOSIS — F39 Unspecified mood [affective] disorder: Secondary | ICD-10-CM | POA: Diagnosis present

## 2017-06-08 DIAGNOSIS — R319 Hematuria, unspecified: Secondary | ICD-10-CM | POA: Diagnosis present

## 2017-06-08 DIAGNOSIS — G894 Chronic pain syndrome: Secondary | ICD-10-CM | POA: Diagnosis not present

## 2017-06-08 DIAGNOSIS — F17210 Nicotine dependence, cigarettes, uncomplicated: Secondary | ICD-10-CM | POA: Diagnosis present

## 2017-06-08 DIAGNOSIS — Z9981 Dependence on supplemental oxygen: Secondary | ICD-10-CM | POA: Diagnosis not present

## 2017-06-08 DIAGNOSIS — G4733 Obstructive sleep apnea (adult) (pediatric): Secondary | ICD-10-CM | POA: Diagnosis present

## 2017-06-08 DIAGNOSIS — K7460 Unspecified cirrhosis of liver: Secondary | ICD-10-CM | POA: Diagnosis present

## 2017-06-08 DIAGNOSIS — Z885 Allergy status to narcotic agent status: Secondary | ICD-10-CM

## 2017-06-08 DIAGNOSIS — J440 Chronic obstructive pulmonary disease with acute lower respiratory infection: Secondary | ICD-10-CM | POA: Diagnosis present

## 2017-06-08 DIAGNOSIS — J9621 Acute and chronic respiratory failure with hypoxia: Secondary | ICD-10-CM | POA: Diagnosis present

## 2017-06-08 DIAGNOSIS — Z6841 Body Mass Index (BMI) 40.0 and over, adult: Secondary | ICD-10-CM

## 2017-06-08 DIAGNOSIS — K5909 Other constipation: Secondary | ICD-10-CM | POA: Diagnosis present

## 2017-06-08 DIAGNOSIS — I252 Old myocardial infarction: Secondary | ICD-10-CM

## 2017-06-08 DIAGNOSIS — E785 Hyperlipidemia, unspecified: Secondary | ICD-10-CM | POA: Diagnosis present

## 2017-06-08 DIAGNOSIS — I69351 Hemiplegia and hemiparesis following cerebral infarction affecting right dominant side: Secondary | ICD-10-CM

## 2017-06-08 DIAGNOSIS — I110 Hypertensive heart disease with heart failure: Secondary | ICD-10-CM | POA: Diagnosis present

## 2017-06-08 DIAGNOSIS — E118 Type 2 diabetes mellitus with unspecified complications: Secondary | ICD-10-CM | POA: Diagnosis present

## 2017-06-08 DIAGNOSIS — I5032 Chronic diastolic (congestive) heart failure: Secondary | ICD-10-CM | POA: Diagnosis present

## 2017-06-08 DIAGNOSIS — Z794 Long term (current) use of insulin: Secondary | ICD-10-CM

## 2017-06-08 DIAGNOSIS — I10 Essential (primary) hypertension: Secondary | ICD-10-CM | POA: Diagnosis present

## 2017-06-08 DIAGNOSIS — J189 Pneumonia, unspecified organism: Secondary | ICD-10-CM | POA: Diagnosis present

## 2017-06-08 DIAGNOSIS — I2510 Atherosclerotic heart disease of native coronary artery without angina pectoris: Secondary | ICD-10-CM | POA: Diagnosis present

## 2017-06-08 DIAGNOSIS — Z79899 Other long term (current) drug therapy: Secondary | ICD-10-CM

## 2017-06-08 DIAGNOSIS — M810 Age-related osteoporosis without current pathological fracture: Secondary | ICD-10-CM | POA: Diagnosis present

## 2017-06-08 DIAGNOSIS — A419 Sepsis, unspecified organism: Principal | ICD-10-CM | POA: Diagnosis present

## 2017-06-08 DIAGNOSIS — J181 Lobar pneumonia, unspecified organism: Secondary | ICD-10-CM

## 2017-06-08 DIAGNOSIS — Z7951 Long term (current) use of inhaled steroids: Secondary | ICD-10-CM

## 2017-06-08 DIAGNOSIS — Z7982 Long term (current) use of aspirin: Secondary | ICD-10-CM

## 2017-06-08 DIAGNOSIS — Z888 Allergy status to other drugs, medicaments and biological substances status: Secondary | ICD-10-CM

## 2017-06-08 LAB — CBC WITH DIFFERENTIAL/PLATELET
BASOS PCT: 0 %
Basophils Absolute: 0 10*3/uL (ref 0.0–0.1)
EOS ABS: 0 10*3/uL (ref 0.0–0.7)
EOS PCT: 0 %
HCT: 42 % (ref 36.0–46.0)
HEMOGLOBIN: 14.1 g/dL (ref 12.0–15.0)
LYMPHS ABS: 1.2 10*3/uL (ref 0.7–4.0)
Lymphocytes Relative: 7 %
MCH: 31.8 pg (ref 26.0–34.0)
MCHC: 33.6 g/dL (ref 30.0–36.0)
MCV: 94.6 fL (ref 78.0–100.0)
MONOS PCT: 8 %
Monocytes Absolute: 1.3 10*3/uL — ABNORMAL HIGH (ref 0.1–1.0)
NEUTROS PCT: 85 %
Neutro Abs: 13.5 10*3/uL — ABNORMAL HIGH (ref 1.7–7.7)
PLATELETS: 141 10*3/uL — AB (ref 150–400)
RBC: 4.44 MIL/uL (ref 3.87–5.11)
RDW: 12.5 % (ref 11.5–15.5)
WBC: 15.9 10*3/uL — ABNORMAL HIGH (ref 4.0–10.5)

## 2017-06-08 LAB — COMPREHENSIVE METABOLIC PANEL
ALT: 10 U/L — AB (ref 14–54)
AST: 16 U/L (ref 15–41)
Albumin: 2.9 g/dL — ABNORMAL LOW (ref 3.5–5.0)
Alkaline Phosphatase: 94 U/L (ref 38–126)
Anion gap: 10 (ref 5–15)
BUN: 7 mg/dL (ref 6–20)
CHLORIDE: 94 mmol/L — AB (ref 101–111)
CO2: 28 mmol/L (ref 22–32)
CREATININE: 0.89 mg/dL (ref 0.44–1.00)
Calcium: 8.6 mg/dL — ABNORMAL LOW (ref 8.9–10.3)
Glucose, Bld: 309 mg/dL — ABNORMAL HIGH (ref 65–99)
Potassium: 3.7 mmol/L (ref 3.5–5.1)
Sodium: 132 mmol/L — ABNORMAL LOW (ref 135–145)
Total Bilirubin: 1 mg/dL (ref 0.3–1.2)
Total Protein: 6.8 g/dL (ref 6.5–8.1)

## 2017-06-08 LAB — URINALYSIS, ROUTINE W REFLEX MICROSCOPIC
Bilirubin Urine: NEGATIVE
GLUCOSE, UA: 150 mg/dL — AB
Ketones, ur: NEGATIVE mg/dL
Leukocytes, UA: NEGATIVE
NITRITE: NEGATIVE
PH: 6 (ref 5.0–8.0)
SPECIFIC GRAVITY, URINE: 1.022 (ref 1.005–1.030)

## 2017-06-08 LAB — I-STAT CG4 LACTIC ACID, ED: Lactic Acid, Venous: 1.54 mmol/L (ref 0.5–1.9)

## 2017-06-08 LAB — INFLUENZA PANEL BY PCR (TYPE A & B)
Influenza A By PCR: NEGATIVE
Influenza B By PCR: NEGATIVE

## 2017-06-08 LAB — CBG MONITORING, ED: GLUCOSE-CAPILLARY: 240 mg/dL — AB (ref 65–99)

## 2017-06-08 LAB — GLUCOSE, CAPILLARY: GLUCOSE-CAPILLARY: 282 mg/dL — AB (ref 65–99)

## 2017-06-08 MED ORDER — ACETAMINOPHEN 325 MG PO TABS
650.0000 mg | ORAL_TABLET | Freq: Once | ORAL | Status: AC | PRN
  Administered 2017-06-08: 650 mg via ORAL
  Filled 2017-06-08: qty 2

## 2017-06-08 MED ORDER — ALBUTEROL SULFATE (2.5 MG/3ML) 0.083% IN NEBU: 3.0000 mL | INHALATION_SOLUTION | RESPIRATORY_TRACT | Status: DC | PRN

## 2017-06-08 MED ORDER — SODIUM CHLORIDE 0.9% FLUSH
3.0000 mL | Freq: Two times a day (BID) | INTRAVENOUS | Status: DC
  Administered 2017-06-08 – 2017-06-12 (×8): 3 mL via INTRAVENOUS

## 2017-06-08 MED ORDER — TRAMADOL HCL 50 MG PO TABS: 50.0000 mg | ORAL_TABLET | Freq: Once | ORAL | Status: DC | PRN

## 2017-06-08 MED ORDER — INSULIN ASPART 100 UNIT/ML ~~LOC~~ SOLN
60.0000 [IU] | Freq: Every day | SUBCUTANEOUS | Status: DC
  Administered 2017-06-09 – 2017-06-10 (×2): 60 [IU] via SUBCUTANEOUS

## 2017-06-08 MED ORDER — FAMOTIDINE 20 MG PO TABS
20.0000 mg | ORAL_TABLET | Freq: Two times a day (BID) | ORAL | Status: DC
  Administered 2017-06-08 – 2017-06-12 (×8): 20 mg via ORAL
  Filled 2017-06-08 (×8): qty 1

## 2017-06-08 MED ORDER — AMLODIPINE BESYLATE 5 MG PO TABS
2.5000 mg | ORAL_TABLET | Freq: Every day | ORAL | Status: DC
  Administered 2017-06-09 – 2017-06-12 (×4): 2.5 mg via ORAL
  Filled 2017-06-08 (×4): qty 1

## 2017-06-08 MED ORDER — VANCOMYCIN HCL 10 G IV SOLR
2000.0000 mg | INTRAVENOUS | Status: AC
  Administered 2017-06-08: 2000 mg via INTRAVENOUS
  Filled 2017-06-08: qty 2000

## 2017-06-08 MED ORDER — SODIUM CHLORIDE 0.9 % IV SOLN
1.0000 g | INTRAVENOUS | Status: DC
  Administered 2017-06-08 – 2017-06-11 (×4): 1 g via INTRAVENOUS
  Filled 2017-06-08 (×4): qty 1
  Filled 2017-06-08: qty 10

## 2017-06-08 MED ORDER — BUPROPION HCL ER (XL) 150 MG PO TB24
300.0000 mg | ORAL_TABLET | Freq: Every day | ORAL | Status: DC
  Administered 2017-06-09 – 2017-06-12 (×4): 300 mg via ORAL
  Filled 2017-06-08 (×4): qty 2

## 2017-06-08 MED ORDER — INSULIN ASPART 100 UNIT/ML ~~LOC~~ SOLN
0.0000 [IU] | Freq: Three times a day (TID) | SUBCUTANEOUS | Status: DC
  Administered 2017-06-09: 2 [IU] via SUBCUTANEOUS
  Administered 2017-06-09 – 2017-06-10 (×4): 3 [IU] via SUBCUTANEOUS
  Administered 2017-06-11: 2 [IU] via SUBCUTANEOUS

## 2017-06-08 MED ORDER — SODIUM CHLORIDE 0.9 % IV BOLUS (SEPSIS)
1000.0000 mL | Freq: Once | INTRAVENOUS | Status: AC
  Administered 2017-06-08: 1000 mL via INTRAVENOUS

## 2017-06-08 MED ORDER — SODIUM CHLORIDE 0.9% FLUSH: 3.0000 mL | INTRAVENOUS | Status: DC | PRN

## 2017-06-08 MED ORDER — IOPAMIDOL (ISOVUE-300) INJECTION 61%
INTRAVENOUS | Status: AC
  Administered 2017-06-08: 100 mL via INTRAVENOUS
  Filled 2017-06-08: qty 100

## 2017-06-08 MED ORDER — GABAPENTIN 300 MG PO CAPS
300.0000 mg | ORAL_CAPSULE | Freq: Two times a day (BID) | ORAL | Status: DC
  Administered 2017-06-08 – 2017-06-12 (×8): 300 mg via ORAL
  Filled 2017-06-08 (×8): qty 1

## 2017-06-08 MED ORDER — INSULIN GLARGINE 100 UNIT/ML ~~LOC~~ SOLN
100.0000 [IU] | Freq: Every day | SUBCUTANEOUS | Status: DC
Start: 2017-06-08 — End: 2017-06-12
  Administered 2017-06-08 – 2017-06-12 (×5): 100 [IU] via SUBCUTANEOUS
  Filled 2017-06-08 (×5): qty 1

## 2017-06-08 MED ORDER — ASPIRIN EC 81 MG PO TBEC
81.0000 mg | DELAYED_RELEASE_TABLET | Freq: Every day | ORAL | Status: DC
  Administered 2017-06-09 – 2017-06-12 (×4): 81 mg via ORAL
  Filled 2017-06-08 (×4): qty 1

## 2017-06-08 MED ORDER — ONDANSETRON HCL 4 MG/2ML IJ SOLN
4.0000 mg | Freq: Four times a day (QID) | INTRAMUSCULAR | Status: DC | PRN
  Administered 2017-06-08: 4 mg via INTRAVENOUS
  Filled 2017-06-08: qty 2

## 2017-06-08 MED ORDER — PIPERACILLIN-TAZOBACTAM 3.375 G IVPB 30 MIN
3.3750 g | INTRAVENOUS | Status: AC
  Administered 2017-06-08: 3.375 g via INTRAVENOUS
  Filled 2017-06-08: qty 50

## 2017-06-08 MED ORDER — SODIUM CHLORIDE 0.9 % IV SOLN
500.0000 mg | INTRAVENOUS | Status: DC
  Administered 2017-06-08 – 2017-06-10 (×3): 500 mg via INTRAVENOUS
  Filled 2017-06-08 (×4): qty 500

## 2017-06-08 MED ORDER — ACETAMINOPHEN 325 MG PO TABS
650.0000 mg | ORAL_TABLET | Freq: Four times a day (QID) | ORAL | Status: AC | PRN
  Administered 2017-06-09: 650 mg via ORAL
  Filled 2017-06-08: qty 2

## 2017-06-08 MED ORDER — TRAMADOL HCL 50 MG PO TABS
50.0000 mg | ORAL_TABLET | Freq: Once | ORAL | Status: AC | PRN
  Administered 2017-06-08: 50 mg via ORAL
  Filled 2017-06-08: qty 1

## 2017-06-08 MED ORDER — SODIUM CHLORIDE 0.9 % IV SOLN: 250.0000 mL | INTRAVENOUS | Status: DC | PRN

## 2017-06-08 MED ORDER — NICOTINE 21 MG/24HR TD PT24
21.0000 mg | MEDICATED_PATCH | Freq: Every day | TRANSDERMAL | Status: DC
  Administered 2017-06-08 – 2017-06-12 (×5): 21 mg via TRANSDERMAL
  Filled 2017-06-08 (×5): qty 1

## 2017-06-08 MED ORDER — SODIUM CHLORIDE 0.9 % IV BOLUS (SEPSIS): 1000.0000 mL | Freq: Once | INTRAVENOUS | Status: DC

## 2017-06-08 NOTE — Progress Notes (Signed)
Consults were received from an ED provider for vancomycin and zosyn per pharmacy dosing.  The patient's profile has been reviewed for ht/wt/allergies/indication/available labs.   One time orders have been placed for vancomycin 2gm IV x1, zosyn 3.375gm IV over 93min x 1.  Further antibiotics/pharmacy consults should be ordered by admitting physician if indicated.                       Thank you, Doreene Eland, PharmD, BCPS.   06/08/2017 2:47 PM

## 2017-06-08 NOTE — ED Notes (Signed)
Patient transferred to floor. Blood sugar obtained before left. Patient is NPO stated she wouldn't take all that if she didn't get to eat. RN aware.

## 2017-06-08 NOTE — ED Notes (Signed)
Bed: VD47 Expected date:  Expected time:  Means of arrival:  Comments: EMS/flu sx/hyperglycemia

## 2017-06-08 NOTE — ED Triage Notes (Signed)
Per EMS pt with productive cough x 3 weeks with some blood tinged mucus today. Also reports N/V/D, fever x 3 weeks with generalized abd pain.

## 2017-06-08 NOTE — Progress Notes (Signed)
Patient admitted to room 1517 from ED. Checked in and oriented to room by dayshift nurse. Family at bedside. Patient is alert and oriented, family states she does get confused. Patient has SL, redness to groin, otherwise skin intact. Bilateral redness to eyes with clear drainage. Will ct monitor.

## 2017-06-08 NOTE — Progress Notes (Signed)
Patient c/o pain all over, takes Oxy at home. Has nothing ordered. Paged provider on call for orders. Will c/t monitor.
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
## 2017-06-08 NOTE — Progress Notes (Signed)
Patient vomited small amount of tea that she just consumed with po meds. Contacted provider on call for medications. Will c/t monitor.

## 2017-06-08 NOTE — Telephone Encounter (Signed)
Patient son called stated that a FLMA form was faxed over to our office to be fill out. Want to know if we received it.  Please advise

## 2017-06-09 LAB — BASIC METABOLIC PANEL
Anion gap: 8 (ref 5–15)
BUN: 9 mg/dL (ref 6–20)
CALCIUM: 8.5 mg/dL — AB (ref 8.9–10.3)
CO2: 30 mmol/L (ref 22–32)
CREATININE: 0.96 mg/dL (ref 0.44–1.00)
Chloride: 99 mmol/L — ABNORMAL LOW (ref 101–111)
GFR calc Af Amer: >60 mL/min (ref >60)
GFR, EST NON AFRICAN AMERICAN: 56 mL/min — AB (ref >60)
GLUCOSE: 229 mg/dL — AB (ref 65–99)
Potassium: 3.8 mmol/L (ref 3.5–5.1)
Sodium: 137 mmol/L (ref 135–145)

## 2017-06-09 LAB — GLUCOSE, CAPILLARY
GLUCOSE-CAPILLARY: 245 mg/dL — AB (ref 65–99)
GLUCOSE-CAPILLARY: 198 mg/dL — AB (ref 65–99)
Glucose-Capillary: 224 mg/dL — ABNORMAL HIGH (ref 65–99)
Glucose-Capillary: 149 mg/dL — ABNORMAL HIGH (ref 65–99)

## 2017-06-09 LAB — CBC WITH DIFFERENTIAL/PLATELET
BASOS PCT: 0 %
Basophils Absolute: 0 10*3/uL (ref 0.0–0.1)
EOS ABS: 0 10*3/uL (ref 0.0–0.7)
EOS PCT: 0 %
HEMATOCRIT: 40.9 % (ref 36.0–46.0)
Hemoglobin: 13.1 g/dL (ref 12.0–15.0)
Lymphocytes Relative: 11 %
Lymphs Abs: 1.7 10*3/uL (ref 0.7–4.0)
MCH: 31.3 pg (ref 26.0–34.0)
MCHC: 32 g/dL (ref 30.0–36.0)
MCV: 97.8 fL (ref 78.0–100.0)
MONO ABS: 1.4 10*3/uL — AB (ref 0.1–1.0)
MONOS PCT: 9 %
Neutro Abs: 12.7 10*3/uL — ABNORMAL HIGH (ref 1.7–7.7)
Neutrophils Relative %: 80 %
PLATELETS: 210 10*3/uL (ref 150–400)
RBC: 4.18 MIL/uL (ref 3.87–5.11)
RDW: 12.8 % (ref 11.5–15.5)
WBC: 15.9 10*3/uL — ABNORMAL HIGH (ref 4.0–10.5)

## 2017-06-09 MED ORDER — SENNOSIDES-DOCUSATE SODIUM 8.6-50 MG PO TABS
2.0000 | ORAL_TABLET | Freq: Two times a day (BID) | ORAL | Status: DC
  Administered 2017-06-09 – 2017-06-11 (×5): 2 via ORAL
  Filled 2017-06-09 (×6): qty 2

## 2017-06-09 MED ORDER — PREMIER PROTEIN SHAKE
11.0000 [oz_av] | Freq: Two times a day (BID) | ORAL | Status: DC
  Administered 2017-06-09 – 2017-06-12 (×5): 11 [oz_av] via ORAL
  Filled 2017-06-09 (×7): qty 325.31

## 2017-06-09 MED ORDER — POLYETHYLENE GLYCOL 3350 17 G PO PACK
17.0000 g | PACK | Freq: Every day | ORAL | Status: DC
  Administered 2017-06-09 – 2017-06-12 (×4): 17 g via ORAL
  Filled 2017-06-09 (×4): qty 1

## 2017-06-09 MED ORDER — GUAIFENESIN-DM 100-10 MG/5ML PO SYRP
5.0000 mL | ORAL_SOLUTION | ORAL | Status: DC | PRN
  Administered 2017-06-09 – 2017-06-11 (×2): 5 mL via ORAL
  Filled 2017-06-09 (×2): qty 10

## 2017-06-09 MED ORDER — HEPARIN SODIUM (PORCINE) 5000 UNIT/ML IJ SOLN
5000.0000 [IU] | Freq: Three times a day (TID) | INTRAMUSCULAR | Status: DC
Start: 2017-06-09 — End: 2017-06-12
  Administered 2017-06-09 – 2017-06-12 (×7): 5000 [IU] via SUBCUTANEOUS
  Filled 2017-06-09 (×8): qty 1

## 2017-06-09 NOTE — Progress Notes (Signed)
Patient Demographics:    [name], is a 77 y.o. female, DOB - [birth year], NLZ:[id number]  Admit date - 06/08/2017   Admitting Physician Phillips Grout, MD  Outpatient Primary MD for the patient is Renato Shin, MD  LOS - 1   Chief Complaint  Patient presents with  . Cough  . Abdominal Pain        Subjective:    Somers is feeling better, fever is resolving, no emesis,  No chest pain, cough is better,  Assessment  & Plan :    Principal Problem:   PNA (pneumonia) Active Problems:   Chronic diastolic CHF (congestive heart failure) (HCC)   Chronic pain syndrome   Essential hypertension   Diabetes mellitus with complication (HCC)   HLD (hyperlipidemia)   1)PNA-cough or shortness of breath improving, leukocytosis persist, continue Rocephin and azithromycin along with bronchodilators and mucolytics  2)HTN-stable, continue amlodipine 2.5 mg daily  3) acute on chronic hypoxic respiratory failure-this is secondary to #1 above, in the setting of underlying COPD , at baseline patient uses 2 L of oxygen at home, continue to wean down O2, treat pneumonia as above #1  4) tobacco abuse-patient not very interested in smoking cessation, continue nicotine patch  5)DM-continue Lantus insulin 100 units daily along with sliding scale insulin coverage  6) chronic constipation-MiraLAX and Senokot as as ordered  Code Status : Full   Disposition Plan  : home with Home health  Consults  :  PT   DVT Prophylaxis  : Heparin   Lab Results  Component Value Date   PLT 210 06/09/2017    Inpatient Medications  Scheduled Meds: . amLODipine  2.5 mg Oral Daily  . aspirin EC  81 mg Oral Daily  . buPROPion  300 mg Oral Daily  . famotidine  20 mg Oral BID  . gabapentin  300 mg Oral BID  . insulin aspart  0-9 Units Subcutaneous TID WC  . insulin aspart  60 Units Subcutaneous Q  supper  . insulin glargine  100 Units Subcutaneous Daily  . nicotine  21 mg Transdermal Daily  . polyethylene glycol  17 g Oral Daily  . protein supplement shake  11 oz Oral BID BM  . senna-docusate  2 tablet Oral BID  . sodium chloride flush  3 mL Intravenous Q12H   Continuous Infusions: . sodium chloride    . azithromycin Stopped (06/08/17 2253)  . cefTRIAXone (ROCEPHIN)  IV Stopped (06/08/17 2350)   PRN Meds:.sodium chloride, albuterol, guaiFENesin-dextromethorphan, ondansetron, sodium chloride flush    Anti-infectives (From admission, onward)   Start     Dose/Rate Route Frequency Ordered Stop   06/08/17 2200  cefTRIAXone (ROCEPHIN) 1 g in sodium chloride 0.9 % 100 mL IVPB     1 g 200 mL/hr over 30 Minutes Intravenous Every 24 hours 06/08/17 1644 06/15/17 2159   06/08/17 2100  azithromycin (ZITHROMAX) 500 mg in sodium chloride 0.9 % 250 mL IVPB     500 mg 250 mL/hr over 60 Minutes Intravenous Every 24 hours 06/08/17 1644 06/15/17 2059   06/08/17 1515  piperacillin-tazobactam (ZOSYN) IVPB 3.375 g     3.375 g 100 mL/hr over 30 Minutes Intravenous NOW 06/08/17 1444 06/08/17 1525   06/08/17 1445  vancomycin (VANCOCIN) 2,000 mg in sodium chloride 0.9 % 500 mL IVPB     2,000 mg 250 mL/hr over 120 Minutes Intravenous NOW 06/08/17 1444 06/08/17 1728        Objective:   Vitals:   06/08/17 1500 06/08/17 1505 06/08/17 2148 06/09/17 0543  BP:  125/65 (!) 140/46 122/69  Pulse:  91 81 83  Resp:  (!) 23 (!) 22 20  Temp: (!) 101.3 F (38.5 C)  99.3 F (37.4 C) 99.1 F (37.3 C)  TempSrc: Oral  Oral Axillary  SpO2:  93% 90% 94%  Weight:      Height:        Wt Readings from Last 3 Encounters:  06/08/17 (!) 158.8 kg (350 lb)  04/25/17 (!) 158.9 kg (350 lb 6.4 oz)  02/15/17 (!) 155.1 kg (342 lb)     Intake/Output Summary (Last 24 hours) at 06/09/2017 1855 Last data filed at 06/09/2017 0542 Gross per 24 hour  Intake 380 ml  Output 400 ml  Net -20 ml     Physical  Exam  Gen:- Awake Alert, morbidly obese, able to speak in sentences HEENT:- [place].AT, conjunctiva irritation noted Nose- [place] 3L/min Neck-Supple Neck,No JVD,.  Lungs-diminished in bases, scattered rhonchi  CV- S1, S2 normal Abd-  +ve B.Sounds, Abd Soft, No tenderness, increased truncal adiposity Extremity/Skin:- No  edema,    Psych-affect is appropriate Neuro-no new focal deficits   Data Review:   Micro Results Recent Results (from the past 240 hour(s))  Blood culture (routine x 2)     Status: None (Preliminary result)   Collection Time: 06/08/17  2:30 PM  Result Value Ref Range Status   Specimen Description   Final    BLOOD RIGHT ANTECUBITAL Performed at [facility] [address] [place], [place] [address]    Special Requests   Final    BOTTLES DRAWN AEROBIC ONLY Blood Culture adequate volume Performed at [facility] [address], [place], [place] [address]    Culture   Final    NO GROWTH < 24 HOURS Performed at [facility] [address], [place], [place] [address]    Report Status PENDING  Incomplete    Radiology Reports Dg Chest 2 View  Result Date: 06/08/2017 CLINICAL DATA:  Cough and fever EXAM: CHEST  2 VIEW COMPARISON:  April 25, 2017 FINDINGS: There is extensive airspace consolidation in the left upper lobe, primarily anteriorly. The lungs elsewhere are clear. Heart is mildly enlarged with pulmonary vascularity within normal limits. No adenopathy. No bone lesions. IMPRESSION: Extensive airspace consolidation consistent with pneumonia in the left upper lobe, primarily in the anterior segment. Lungs elsewhere clear. Heart mildly enlarged. Followup PA and lateral chest radiographs recommended in 3-4 weeks following trial of antibiotic therapy to ensure resolution and exclude underlying malignancy. Electronically Signed   By: Lowella Grip III M.D.   On: 06/08/2017 14:31   Ct Abdomen Pelvis W Contrast  Result Date:  06/08/2017 CLINICAL DATA:  Coughing up blood short of breath abdominal pain vomiting EXAM: CT ABDOMEN AND PELVIS WITH CONTRAST TECHNIQUE: Multidetector CT imaging of the abdomen and pelvis was performed using the standard protocol following bolus administration of intravenous contrast. CONTRAST:  142mL ISOVUE-300 IOPAMIDOL (ISOVUE-300) INJECTION 61% COMPARISON:  CT 12/28/2016, 01/29/2016, 06/18/2014 FINDINGS: Lower chest: Trace left pleural effusion. Ground-glass density and oval region of consolidation within the posterior left lung base. Borderline cardiomegaly. Hepatobiliary: No focal hepatic abnormality. Calcified gallstone. No biliary dilatation Pancreas:  Unremarkable. No pancreatic ductal dilatation or surrounding inflammatory changes. Spleen: Normal in size without focal abnormality. Adrenals/Urinary Tract: Adrenal glands are unremarkable. Kidneys are normal, without renal calculi, focal lesion, or hydronephrosis. Bladder is unremarkable. Nonspecific perinephric fat stranding Stomach/Bowel: Stomach is within normal limits. Appendix not well seen consistent with history of appendectomy. No evidence of bowel wall thickening, distention, or inflammatory changes. Vascular/Lymphatic: No significant vascular findings are present. No enlarged abdominal or pelvic lymph nodes. Reproductive: Status post hysterectomy. No adnexal masses. Other: Negative for free air or free fluid. Prominent skin thickening of the anterior abdominal wall with mild infiltration of the subcutaneous fat Musculoskeletal: Degenerative changes. No acute or suspicious lesion. IMPRESSION: 1. Trace left pleural effusion with ground-glass density and consolidation in the left lung base. Findings likely secondary to pneumonia, however given slightly masslike configuration, recommend short interval CT follow-up in 6-12 weeks to ensure clearing. 2. No CT evidence for acute intra-abdominal or pelvic abnormality 3. Gallstone Electronically Signed   By:  Donavan Foil M.D.   On: 06/08/2017 19:09     CBC Recent Labs  Lab 06/08/17 1345 06/09/17 0543  WBC 15.9* 15.9*  HGB 14.1 13.1  HCT 42.0 40.9  PLT 141* 210  MCV 94.6 97.8  MCH 31.8 31.3  MCHC 33.6 32.0  RDW 12.5 12.8  LYMPHSABS 1.2 1.7  MONOABS 1.3* 1.4*  EOSABS 0.0 0.0  BASOSABS 0.0 0.0    Chemistries  Recent Labs  Lab 06/08/17 1345 06/09/17 0543  NA 132* 137  K 3.7 3.8  CL 94* 99*  CO2 28 30  GLUCOSE 309* 229*  BUN 7 9  CREATININE 0.89 0.96  CALCIUM 8.6* 8.5*  AST 16  --   ALT 10*  --   ALKPHOS 94  --   BILITOT 1.0  --    ------------------------------------------------------------------------------------------------------------------ No results for input(s): CHOL, HDL, LDLCALC, TRIG, CHOLHDL, LDLDIRECT in the last 72 hours.  Lab Results  Component Value Date   HGBA1C 9.1 05/27/2017   ------------------------------------------------------------------------------------------------------------------ No results for input(s): TSH, T4TOTAL, T3FREE, THYROIDAB in the last 72 hours.  Invalid input(s): FREET3 ------------------------------------------------------------------------------------------------------------------ No results for input(s): VITAMINB12, FOLATE, FERRITIN, TIBC, IRON, RETICCTPCT in the last 72 hours.  Coagulation profile No results for input(s): INR, PROTIME in the last 168 hours.  No results for input(s): DDIMER in the last 72 hours.  Cardiac Enzymes No results for input(s): CKMB, TROPONINI, MYOGLOBIN in the last 168 hours.  Invalid input(s): CK ------------------------------------------------------------------------------------------------------------------    Component Value Date/Time   BNP 20.9 01/29/2016 1453     Channie Bostick M.D on 06/09/2017 at 6:55 PM  Between 7am to 7pm - Pager - [phone]  After 7pm go to [email or web address] - password [email or web address]  [facility] -  Office  [phone]   Voice Recognition Viviann Spare  dictation system was used to create this note, attempts have been made to correct errors. Please contact the author with questions and/or clarifications.

## 2017-06-09 NOTE — Telephone Encounter (Signed)
I called back patient's number but patient is in hospital with pneumonia. I spoke with her daughter & she stated that she would call her brother to have him send paperwork back to [place] for FMLA.

## 2017-06-09 NOTE — Progress Notes (Signed)
Inpatient Diabetes Program Recommendations  AACE/ADA: New Consensus Statement on Inpatient Glycemic Control (2015)  Target Ranges:  Prepandial:   less than 140 mg/dL      Peak postprandial:   less than 180 mg/dL (1-2 hours)      Critically ill patients:  140 - 180 mg/dL   Lab Results  Component Value Date   GLUCAP 245 (H) 06/09/2017   HGBA1C 9.1 05/27/2017    Review of Glycemic Control  Diabetes history: DM2 Outpatient Diabetes medications: Lantus 100 units QAM, Humalog 60 units QD with supper  Current orders for Inpatient glycemic control: Lantus 100 units QD, Novolog 0-9 units tidwc + 60 units QD with supper  HgbA1C - 9.1% - uncontrolled diabetes  Inpatient Diabetes Program Recommendations:     May consider decreasing Novolog at supper to 30 units (1/2 home dose) to prevent hypoglycemia. Add CHO mod med to 2 gram sodium diet.  Will follow.  Thank you. Lorenda Peck, RD, LDN, CDE Inpatient Diabetes Coordinator [phone]

## 2017-06-09 NOTE — Progress Notes (Signed)
Initial Nutrition Assessment  DOCUMENTATION CODES:   Morbid obesity  INTERVENTION:   Provide Premier Protein BID, each supplement provides 160kcal and 30g protein.   NUTRITION DIAGNOSIS:   Inadequate oral intake related to poor appetite as evidenced by per patient/family report.  GOAL:   Patient will meet greater than or equal to 90% of their needs  MONITOR:   PO intake, Supplement acceptance, Weight trends, Labs  REASON FOR ASSESSMENT:   Malnutrition Screening Tool    ASSESSMENT:   Pt with PMH significant for CHF, DM, CAD, and COPD. Presents this admission with complaints of shortness of breath for two week prior to admission. Admitted for PNA.    Spoke with pt at bedside. Reports consuming very little PO for three week PTA due to increased coughing with blood. Pt attempted to eat ham sandwich last night but could not tolerate the taste. Pt ordered grits with eggs this morning but only drank her orange juice and coffee. Pt request that she has liquid source of nutrition because she does not want normal consistency foods. Will provide premier protein.   Pt denies any recent wt loss. Records show fluctuating wts throughout the year, likely due to history of CHF. Nutrition-Focused physical exam completed.   Medications reviewed and include: SSI, lantus, IV abx Labs reviewed: CBG 229 (H)   NUTRITION - FOCUSED PHYSICAL EXAM:    Most Recent Value  Orbital Region  No depletion  Upper Arm Region  No depletion  Thoracic and Lumbar Region  Unable to assess  Buccal Region  No depletion  Temple Region  No depletion  Clavicle Bone Region  No depletion  Clavicle and Acromion Bone Region  No depletion  Scapular Bone Region  Unable to assess  Dorsal Hand  No depletion  Patellar Region  No depletion  Anterior Thigh Region  No depletion  Posterior Calf Region  No depletion  Edema (RD Assessment)  Unable to assess     Diet Order:  Diet 2 gram sodium Room service appropriate?  Yes; Fluid consistency: Thin  EDUCATION NEEDS:   Not appropriate for education at this time  Skin:  Skin Assessment: Reviewed RN Assessment  Last BM:  06/08/17  Height:   Ht Readings from Last 1 Encounters:  06/08/17 5\' 3"  (1.6 m)    Weight:   Wt Readings from Last 1 Encounters:  06/08/17 (!) 350 lb (158.8 kg)    Ideal Body Weight:  52.3 kg  BMI:  Body mass index is 62 kg/m.  Estimated Nutritional Needs:   Kcal:  1600-1800 kcal/day  Protein:  80-90 g/day  Fluid:  >1.6 L/day    Mariana Single RD, LDN Clinical Nutrition Pager # - [phone]

## 2017-06-09 NOTE — Progress Notes (Signed)
Patient coughing significantly and is requesting something for the cough. Cough is productive with small amount of yellow/tan sputum. Will page on call provider.

## 2017-06-09 NOTE — Evaluation (Signed)
Physical Therapy Evaluation Patient Details Name: [name] MRN: [id number] DOB: 1941/04/08 Today's Date: 06/09/2017   History of Present Illness  [name] is a 77 y.o. female with medical history significant of morbid obesity, congestive heart failure, diabetes, coronary artery disease, COPD on 2 L of oxygen at home; adm with shortness of breath coughing, high fevers.    Clinical Impression  Pt admitted with above diagnosis. Pt currently with functional limitations due to the deficits listed below (see PT Problem List). Pt with limited mobility at her baseline, modified independent with bed mobility and transfers, does not ambulate; will continue to follow in acute setting; may need HHPT if pt not back to  her baseline at time of D/C;* Pt will benefit from skilled PT to increase their independence and safety with mobility to allow discharge to the venue listed below.      Follow Up Recommendations Home health PT    Equipment Recommendations  None recommended by PT    Recommendations for Other Services       Precautions / Restrictions Precautions Precautions: Fall Restrictions Weight Bearing Restrictions: No      Mobility  Bed Mobility Overal bed mobility: Needs Assistance Bed Mobility: Supine to Sit     Supine to sit: Mod assist;Min assist     General bed mobility comments: assist for LEs off bed, max encouragement, incr time  Transfers Overall transfer level: Needs assistance Equipment used: Rolling walker (2 wheeled) Transfers: Sit to/from [facility] Sit to Stand: Min assist;+2 safety/equipment;+2 physical assistance Stand pivot transfers: Min assist;+2 physical assistance;+2 safety/equipment       General transfer comment: cues for hand placement, safety, RW position;  2-3/4 DOE with transfers  Ambulation/Gait                Stairs            Wheelchair Mobility    Modified Rankin (Stroke Patients  Only)       Balance Overall balance assessment: Needs assistance;History of Falls Sitting-balance support: Feet supported Sitting balance-Leahy Scale: Fair       Standing balance-Leahy Scale: Poor Standing balance comment: reliant on UEs                             Pertinent Vitals/Pain Pain Assessment: Faces Faces Pain Scale: Hurts a little bit Pain Location: non-specific Pain Descriptors / Indicators: Grimacing Pain Intervention(s): Monitored during session    Home Living Family/patient expects to be discharged to:: Private residence Living Arrangements: Children Available Help at Discharge: Available PRN/intermittently;Family Type of Home: Apartment Home Access: Stairs to enter   Entrance Stairs-Number of Steps: 1 small step Home Layout: Two level Home Equipment: Walker - 2 wheels;Walker - 4 wheels;Bedside commode;Cane - quad;Wheelchair - manual;Cane - single point Additional Comments: pt reports she has an aide that comes in daily to help with  bathing and meds    Prior Function Level of Independence: Needs assistance   Gait / Transfers Assistance Needed: modified independent with bed mobility and transfers  ADL's / Homemaking Assistance Needed: per pt daughter in law she has assist with ADLs from her [facility] aide        Hand Dominance        Extremity/Trunk Assessment   Upper Extremity Assessment Upper Extremity Assessment: Generalized weakness    Lower Extremity Assessment Lower Extremity Assessment: Generalized weakness       Communication   Communication: No  difficulties  Cognition Arousal/Alertness: Awake/alert Behavior During Therapy: WFL for tasks assessed/performed Overall Cognitive Status: Within Functional Limits for tasks assessed                                        General Comments      Exercises     Assessment/Plan    PT Assessment Patient needs continued PT services  PT Problem List Decreased  strength;Decreased activity tolerance;Decreased balance;Decreased mobility;Decreased safety awareness;Obesity       PT Treatment Interventions DME instruction;Functional mobility training;Therapeutic activities;Therapeutic exercise;Patient/family education    PT Goals (Current goals can be found in the Care Plan section)  Acute Rehab PT Goals Patient Stated Goal: none stated PT Goal Formulation: With patient Time For Goal Achievement: 06/23/17 Potential to Achieve Goals: Fair    Frequency Min 3X/week   Barriers to discharge        Co-evaluation               AM-PAC PT "6 Clicks" Daily Activity  Outcome Measure Difficulty turning over in bed (including adjusting bedclothes, sheets and blankets)?: Unable Difficulty moving from lying on back to sitting on the side of the bed? : Unable Difficulty sitting down on and standing up from a chair with arms (e.g., wheelchair, bedside commode, etc,.)?: Unable Help needed moving to and from a bed to chair (including a wheelchair)?: Total Help needed walking in hospital room?: Total Help needed climbing 3-5 steps with a railing? : Total 6 Click Score: 6    End of Session Equipment Utilized During Treatment: Gait belt Activity Tolerance: Patient limited by fatigue Patient left: in chair;with call bell/phone within reach   PT Visit Diagnosis: Muscle weakness (generalized) (M62.81);History of falling (Z91.81)    Time: 8119-1478 PT Time Calculation (min) (ACUTE ONLY): 24 min   Charges:   PT Evaluation $PT Eval Low Complexity: 1 Low PT Treatments $Therapeutic Activity: 8-22 mins   PT G CodesKenyon Ana, PT Pager: [phone] 06/09/2017   [facility] 06/09/2017, 4:24 PM

## 2017-06-09 NOTE — Telephone Encounter (Signed)
Patient stated he go a call from our office, don't know if you called him or not, didn't see a note.

## 2017-06-10 LAB — GLUCOSE, CAPILLARY
GLUCOSE-CAPILLARY: 116 mg/dL — AB (ref 65–99)
GLUCOSE-CAPILLARY: 55 mg/dL — AB (ref 65–99)
GLUCOSE-CAPILLARY: 77 mg/dL (ref 65–99)
Glucose-Capillary: 205 mg/dL — ABNORMAL HIGH (ref 65–99)
Glucose-Capillary: 215 mg/dL — ABNORMAL HIGH (ref 65–99)
Glucose-Capillary: 32 mg/dL — CL (ref 65–99)
Glucose-Capillary: 103 mg/dL — ABNORMAL HIGH (ref 65–99)

## 2017-06-10 LAB — STREP PNEUMONIAE URINARY ANTIGEN: Strep Pneumo Urinary Antigen: NEGATIVE

## 2017-06-10 MED ORDER — LACTULOSE 10 GM/15ML PO SOLN
30.0000 g | Freq: Once | ORAL | Status: AC
  Administered 2017-06-10: 30 g via ORAL
  Filled 2017-06-10: qty 45

## 2017-06-10 NOTE — Progress Notes (Signed)
Patient Demographics:    [name], is a 77 y.o. female, DOB - 1941-01-29, IRC:[id number]  Admit date - 06/08/2017   Admitting Physician Phillips Grout, MD  Outpatient Primary MD for the patient is Renato Shin, MD  LOS - 2  Chief Complaint  Patient presents with  . Cough  . Abdominal Pain        Subjective:    [place]  C/o constipation, no emesis,  No chest pain, cough is better, T max 100.9   Assessment  & Plan :    Principal Problem:   PNA (pneumonia) Active Problems:   Chronic diastolic CHF (congestive heart failure) (HCC)   Chronic pain syndrome   Essential hypertension   Diabetes mellitus with complication (HCC)   HLD (hyperlipidemia)   1)PNA- T max 100.9, continue IV Rocephin and azithromycin cultures,   continue bronchodilators and mucolytics, patient states that her cough and shortness of breath is improving  2)HTN-stable, continue amlodipine 2.5 mg daily  3)Acute on Chronic Hypoxic Respiratory Failure-this is secondary to #1 above, in the setting of underlying COPD , at baseline patient uses 2 L of oxygen at home, continue to wean down O2, treat pneumonia as above #1  4)Tobacco Abuse-patient not very interested in smoking cessation, continue nicotine patch  5)DM-continue Lantus insulin 100 units daily along with sliding scale insulin coverage  6)Chronic Constipation-MiraLAX and Senokot as as ordered, still no BM, give lactulose x1 dose  7)Mood Disorder-stable, continue Wellbutrin 300 mg daily  Code Status : Full  Disposition Plan  : home with Home health  Consults  :  PT  DVT Prophylaxis  : Heparin   Lab Results  Component Value Date   PLT 210 06/09/2017   Inpatient Medications  Scheduled Meds: . amLODipine  2.5 mg Oral Daily  . aspirin EC  81 mg Oral Daily  . buPROPion  300 mg Oral Daily  . famotidine  20 mg Oral BID  . gabapentin  300  mg Oral BID  . heparin injection (subcutaneous)  5,000 Units Subcutaneous Q8H  . insulin aspart  0-9 Units Subcutaneous TID WC  . insulin aspart  60 Units Subcutaneous Q supper  . insulin glargine  100 Units Subcutaneous Daily  . nicotine  21 mg Transdermal Daily  . polyethylene glycol  17 g Oral Daily  . protein supplement shake  11 oz Oral BID BM  . senna-docusate  2 tablet Oral BID  . sodium chloride flush  3 mL Intravenous Q12H   Continuous Infusions: . sodium chloride    . azithromycin Stopped (06/09/17 2105)  . cefTRIAXone (ROCEPHIN)  IV Stopped (06/09/17 2300)   PRN Meds:.sodium chloride, albuterol, guaiFENesin-dextromethorphan, ondansetron, sodium chloride flush    Anti-infectives (From admission, onward)   Start     Dose/Rate Route Frequency Ordered Stop   06/08/17 2200  cefTRIAXone (ROCEPHIN) 1 g in sodium chloride 0.9 % 100 mL IVPB     1 g 200 mL/hr over 30 Minutes Intravenous Every 24 hours 06/08/17 1644 06/15/17 2159   06/08/17 2100  azithromycin (ZITHROMAX) 500 mg in sodium chloride 0.9 % 250 mL IVPB     500 mg 250 mL/hr over 60 Minutes Intravenous Every 24 hours 06/08/17 1644 06/15/17 2059   06/08/17  1515  piperacillin-tazobactam (ZOSYN) IVPB 3.375 g     3.375 g 100 mL/hr over 30 Minutes Intravenous NOW 06/08/17 1444 06/08/17 1525   06/08/17 1445  vancomycin (VANCOCIN) 2,000 mg in sodium chloride 0.9 % 500 mL IVPB     2,000 mg 250 mL/hr over 120 Minutes Intravenous NOW 06/08/17 1444 06/08/17 1728        Objective:   Vitals:   06/10/17 0408 06/10/17 1004 06/10/17 1409 06/10/17 1419  BP: (!) 144/61 (!) 148/55 (!) 117/57   Pulse: 86 93 75   Resp: (!) 21     Temp: 99 F (37.2 C) 99.6 F (37.6 C) 98.9 F (37.2 C) (!) 100.9 F (38.3 C)  TempSrc: Oral Oral Oral   SpO2: 98% 96% (!) 86% 92%  Weight:      Height:        Wt Readings from Last 3 Encounters:  06/08/17 (!) 158.8 kg (350 lb)  04/25/17 (!) 158.9 kg (350 lb 6.4 oz)  02/15/17 (!) 155.1 kg (342  lb)    Intake/Output Summary (Last 24 hours) at 06/10/2017 1439 Last data filed at 06/10/2017 1400 Gross per 24 hour  Intake 930 ml  Output 1625 ml  Net -695 ml     Physical Exam  Gen:- Awake Alert, morbidly obese, no acute distress HEENT:- [place].AT, conjunctiva irritation noted Nose- [place] 3L/min Neck-Supple Neck,No JVD,.  Lungs-no wheezing, and movement is improving CV- S1, S2 normal Abd-  +ve B.Sounds, Abd Soft, No tenderness, increased truncal adiposity Extremity/Skin:- No  edema,    Psych-affect is appropriate Neuro-no new focal deficits   Data Review:   Micro Results Recent Results (from the past 240 hour(s))  Blood culture (routine x 2)     Status: None (Preliminary result)   Collection Time: 06/08/17  2:30 PM  Result Value Ref Range Status   Specimen Description   Final    BLOOD RIGHT ANTECUBITAL Performed at [facility] [address] [place], [place] [address]    Special Requests   Final    BOTTLES DRAWN AEROBIC ONLY Blood Culture adequate volume Performed at [facility] [address] [place], [place] [address]    Culture   Final    NO GROWTH < 24 HOURS Performed at [facility] [address] [place], [place] [address]    Report Status PENDING  Incomplete    Radiology Reports Dg Chest 2 View  Result Date: 06/08/2017 CLINICAL DATA:  Cough and fever EXAM: CHEST  2 VIEW COMPARISON:  April 25, 2017 FINDINGS: There is extensive airspace consolidation in the left upper lobe, primarily anteriorly. The lungs elsewhere are clear. Heart is mildly enlarged with pulmonary vascularity within normal limits. No adenopathy. No bone lesions. IMPRESSION: Extensive airspace consolidation consistent with pneumonia in the left upper lobe, primarily in the anterior segment. Lungs elsewhere clear. Heart mildly enlarged. Followup PA and lateral chest radiographs recommended in 3-4 weeks following trial of antibiotic therapy to ensure  resolution and exclude underlying malignancy. Electronically Signed   By: Lowella Grip III M.D.   On: 06/08/2017 14:31   Ct Abdomen Pelvis W Contrast  Result Date: 06/08/2017 CLINICAL DATA:  Coughing up blood short of breath abdominal pain vomiting EXAM: CT ABDOMEN AND PELVIS WITH CONTRAST TECHNIQUE: Multidetector CT imaging of the abdomen and pelvis was performed using the standard protocol following bolus administration of intravenous contrast. CONTRAST:  156mL ISOVUE-300 IOPAMIDOL (ISOVUE-300) INJECTION 61% COMPARISON:  CT 12/28/2016, 01/29/2016, 06/18/2014 FINDINGS: Lower  chest: Trace left pleural effusion. Ground-glass density and oval region of consolidation within the posterior left lung base. Borderline cardiomegaly. Hepatobiliary: No focal hepatic abnormality. Calcified gallstone. No biliary dilatation Pancreas: Unremarkable. No pancreatic ductal dilatation or surrounding inflammatory changes. Spleen: Normal in size without focal abnormality. Adrenals/Urinary Tract: Adrenal glands are unremarkable. Kidneys are normal, without renal calculi, focal lesion, or hydronephrosis. Bladder is unremarkable. Nonspecific perinephric fat stranding Stomach/Bowel: Stomach is within normal limits. Appendix not well seen consistent with history of appendectomy. No evidence of bowel wall thickening, distention, or inflammatory changes. Vascular/Lymphatic: No significant vascular findings are present. No enlarged abdominal or pelvic lymph nodes. Reproductive: Status post hysterectomy. No adnexal masses. Other: Negative for free air or free fluid. Prominent skin thickening of the anterior abdominal wall with mild infiltration of the subcutaneous fat Musculoskeletal: Degenerative changes. No acute or suspicious lesion. IMPRESSION: 1. Trace left pleural effusion with ground-glass density and consolidation in the left lung base. Findings likely secondary to pneumonia, however given slightly masslike configuration,  recommend short interval CT follow-up in 6-12 weeks to ensure clearing. 2. No CT evidence for acute intra-abdominal or pelvic abnormality 3. Gallstone Electronically Signed   By: Donavan Foil M.D.   On: 06/08/2017 19:09     CBC Recent Labs  Lab 06/08/17 1345 06/09/17 0543  WBC 15.9* 15.9*  HGB 14.1 13.1  HCT 42.0 40.9  PLT 141* 210  MCV 94.6 97.8  MCH 31.8 31.3  MCHC 33.6 32.0  RDW 12.5 12.8  LYMPHSABS 1.2 1.7  MONOABS 1.3* 1.4*  EOSABS 0.0 0.0  BASOSABS 0.0 0.0    Chemistries  Recent Labs  Lab 06/08/17 1345 06/09/17 0543  NA 132* 137  K 3.7 3.8  CL 94* 99*  CO2 28 30  GLUCOSE 309* 229*  BUN 7 9  CREATININE 0.89 0.96  CALCIUM 8.6* 8.5*  AST 16  --   ALT 10*  --   ALKPHOS 94  --   BILITOT 1.0  --    ------------------------------------------------------------------------------------------------------------------ No results for input(s): CHOL, HDL, LDLCALC, TRIG, CHOLHDL, LDLDIRECT in the last 72 hours.  Lab Results  Component Value Date   HGBA1C 9.1 05/27/2017   ------------------------------------------------------------------------------------------------------------------ No results for input(s): TSH, T4TOTAL, T3FREE, THYROIDAB in the last 72 hours.  Invalid input(s): FREET3 ------------------------------------------------------------------------------------------------------------------ No results for input(s): VITAMINB12, FOLATE, FERRITIN, TIBC, IRON, RETICCTPCT in the last 72 hours.  Coagulation profile No results for input(s): INR, PROTIME in the last 168 hours.  No results for input(s): DDIMER in the last 72 hours.  Cardiac Enzymes No results for input(s): CKMB, TROPONINI, MYOGLOBIN in the last 168 hours.  Invalid input(s): CK ------------------------------------------------------------------------------------------------------------------    Component Value Date/Time   BNP 20.9 01/29/2016 1453     Jeffrey Graefe M.D on 06/10/2017 at 2:38  PM  Between 7am to 7pm - Pager - [phone]  After 7pm go to [email or web address] - password [email or web address]  [facility] -  Office  [phone]   Voice Recognition Viviann Spare dictation system was used to create this note, attempts have been made to correct errors. Please contact the author with questions and/or clarifications.

## 2017-06-10 NOTE — Progress Notes (Signed)
Patient 's temperature = 100.9 F Notified MD. Denton Brick at 1415 Action: awaiting for order.

## 2017-06-10 NOTE — Progress Notes (Signed)
CBG 77 Another 4oz OJ x2 given Will recheck in 15 mins

## 2017-06-10 NOTE — Progress Notes (Signed)
CBG 32 4oz OJ given x2 Will recheck in 52mins

## 2017-06-10 NOTE — Care Management Important Message (Deleted)
Important Message  Patient Details  Name: LASHONE STAUBER MRN: [id number] Date of Birth: 1941/01/09   Medicare Important Message Given:  Yes    Kerin Salen 06/10/2017, 12:41 [place] Message  Patient Details  Name: MARELLA VANDERPOL MRN: [id number] Date of Birth: 11-22-1940   Medicare Important Message Given:  Yes    Kerin Salen 06/10/2017, 12:41 PM

## 2017-06-10 NOTE — Care Management Note (Signed)
Case Management Note  Patient Details  Name: [name] MRN: [id number] Date of Birth: Aug 21, 1940  Subjective/Objective:                    Action/Plan:Plan to discharge home with granddaughter and HH.  Expected Discharge Date:  (unknown)               Expected Discharge Plan:  [place]  In-House Referral:     Discharge planning Services  CM Consult  Post Acute Care Choice:    Choice offered to:     DME Arranged:  Bedside commode(Bariatric) DME Agency:  [place]:  PT [place]:  [place]  Status of Service:  In process, will continue to follow  If discussed at Long Length of Stay Meetings, dates discussed:    Additional CommentsPurcell Mouton, RN 06/10/2017, 2:18 PM

## 2017-06-10 NOTE — Progress Notes (Signed)
CBG 55 4oz OJ given x2 with graham crackers and peanut butter Will recheck in 15 mins

## 2017-06-11 LAB — CBC
HCT: 39.2 % (ref 36.0–46.0)
Hemoglobin: 12.4 g/dL (ref 12.0–15.0)
MCH: 31.3 pg (ref 26.0–34.0)
MCHC: 31.6 g/dL (ref 30.0–36.0)
MCV: 99 fL (ref 78.0–100.0)
PLATELETS: 273 10*3/uL (ref 150–400)
RBC: 3.96 MIL/uL (ref 3.87–5.11)
RDW: 12.6 % (ref 11.5–15.5)
WBC: 9.8 10*3/uL (ref 4.0–10.5)

## 2017-06-11 LAB — BASIC METABOLIC PANEL
Anion gap: 9 (ref 5–15)
BUN: 13 mg/dL (ref 6–20)
CALCIUM: 8.8 mg/dL — AB (ref 8.9–10.3)
CO2: 34 mmol/L — ABNORMAL HIGH (ref 22–32)
Chloride: 96 mmol/L — ABNORMAL LOW (ref 101–111)
Creatinine, Ser: 0.82 mg/dL (ref 0.44–1.00)
GFR calc Af Amer: >60 mL/min (ref >60)
GLUCOSE: 100 mg/dL — AB (ref 65–99)
Potassium: 3.8 mmol/L (ref 3.5–5.1)
Sodium: 139 mmol/L (ref 135–145)

## 2017-06-11 LAB — GLUCOSE, CAPILLARY
GLUCOSE-CAPILLARY: 92 mg/dL (ref 65–99)
GLUCOSE-CAPILLARY: 163 mg/dL — AB (ref 65–99)
Glucose-Capillary: 140 mg/dL — ABNORMAL HIGH (ref 65–99)
Glucose-Capillary: 167 mg/dL — ABNORMAL HIGH (ref 65–99)

## 2017-06-11 MED ORDER — BISACODYL 5 MG PO TBEC
10.0000 mg | DELAYED_RELEASE_TABLET | Freq: Once | ORAL | Status: AC
  Administered 2017-06-11: 10 mg via ORAL
  Filled 2017-06-11: qty 2

## 2017-06-11 MED ORDER — AZITHROMYCIN 250 MG PO TABS
500.0000 mg | ORAL_TABLET | Freq: Every day | ORAL | Status: DC
  Administered 2017-06-11: 500 mg via ORAL
  Filled 2017-06-11: qty 2

## 2017-06-11 MED ORDER — LACTULOSE 10 GM/15ML PO SOLN
30.0000 g | Freq: Once | ORAL | Status: AC
  Administered 2017-06-11: 30 g via ORAL
  Filled 2017-06-11: qty 45

## 2017-06-11 NOTE — Progress Notes (Signed)
Patient Demographics:    [name], is a 77 y.o. female, DOB - 08-03-1940, TTS:[id number]  Admit date - 06/08/2017   Admitting Physician Phillips Grout, MD  Outpatient Primary MD for the patient is Renato Shin, MD  LOS - 3  Chief Complaint  Patient presents with  . Cough  . Abdominal Pain        Subjective:    [name]  C/o constipation, no emesis,  No chest pain, cough is better, c/o constipation,    Assessment  & Plan :    Principal Problem:   PNA (pneumonia) Active Problems:   Chronic diastolic CHF (congestive heart failure) (HCC)   Chronic pain syndrome   Essential hypertension   Diabetes mellitus with complication (HCC)   HLD (hyperlipidemia)   1)PNA-cough and shortness of breath continues to improve, no fevers, continue IV Rocephin and azithromycin   continue bronchodilators and mucolytics,   2)HTN-stable, continue amlodipine 2.5 mg daily  3)Acute on Chronic Hypoxic Respiratory Failure-this is secondary to #1 above, in the setting of underlying COPD , at baseline patient uses 2 L of oxygen at home, continue to wean down O2, treat pneumonia as above #1  4)Tobacco Abuse-patient not very interested in smoking cessation, continue nicotine patch  5)DM-stable, continue Lantus insulin 100 units daily along with sliding scale insulin coverage  6)Chronic Constipation-MiraLAX and Senokot as as ordered,  patient remains constipated, lactulose  7)Mood Disorder-stable, continue Wellbutrin 300 mg daily  Code Status : Full  Disposition Plan  : home with Home health  Consults  :  PT  DVT Prophylaxis  : Heparin   Lab Results  Component Value Date   PLT 273 06/11/2017   Inpatient Medications  Scheduled Meds: . amLODipine  2.5 mg Oral Daily  . aspirin EC  81 mg Oral Daily  . azithromycin  500 mg Oral QHS  . bisacodyl  10 mg Oral Once  . buPROPion  300 mg Oral  Daily  . famotidine  20 mg Oral BID  . gabapentin  300 mg Oral BID  . heparin injection (subcutaneous)  5,000 Units Subcutaneous Q8H  . insulin aspart  0-9 Units Subcutaneous TID WC  . insulin aspart  60 Units Subcutaneous Q supper  . insulin glargine  100 Units Subcutaneous Daily  . lactulose  30 g Oral Once  . nicotine  21 mg Transdermal Daily  . polyethylene glycol  17 g Oral Daily  . protein supplement shake  11 oz Oral BID BM  . senna-docusate  2 tablet Oral BID  . sodium chloride flush  3 mL Intravenous Q12H   Continuous Infusions: . sodium chloride    . cefTRIAXone (ROCEPHIN)  IV 1 g (06/10/17 2254)   PRN Meds:.sodium chloride, albuterol, guaiFENesin-dextromethorphan, ondansetron, sodium chloride flush    Anti-infectives (From admission, onward)   Start     Dose/Rate Route Frequency Ordered Stop   06/11/17 2200  azithromycin (ZITHROMAX) tablet 500 mg     500 mg Oral Daily at bedtime 06/11/17 1300 06/15/17 2159   06/08/17 2200  cefTRIAXone (ROCEPHIN) 1 g in sodium chloride 0.9 % 100 mL IVPB     1 g 200 mL/hr over 30 Minutes Intravenous Every 24 hours 06/08/17 1644 06/15/17 2159  06/08/17 2100  azithromycin (ZITHROMAX) 500 mg in sodium chloride 0.9 % 250 mL IVPB  Status:  Discontinued     500 mg 250 mL/hr over 60 Minutes Intravenous Every 24 hours 06/08/17 1644 06/11/17 1300   06/08/17 1515  piperacillin-tazobactam (ZOSYN) IVPB 3.375 g     3.375 g 100 mL/hr over 30 Minutes Intravenous NOW 06/08/17 1444 06/08/17 1525   06/08/17 1445  vancomycin (VANCOCIN) 2,000 mg in sodium chloride 0.9 % 500 mL IVPB     2,000 mg 250 mL/hr over 120 Minutes Intravenous NOW 06/08/17 1444 06/08/17 1728        Objective:   Vitals:   06/10/17 1800 06/10/17 2041 06/11/17 0612 06/11/17 1500  BP:  (!) 159/77 (!) 159/82 (!) 133/56  Pulse:  83 80 83  Resp:  18 16 20   Temp:  99.1 F (37.3 C) 98.2 F (36.8 C) 98.8 F (37.1 C)  TempSrc: Oral Oral Oral Oral  SpO2:  94% 100% 98%  Weight:       Height:        Wt Readings from Last 3 Encounters:  06/08/17 (!) 158.8 kg (350 lb)  04/25/17 (!) 158.9 kg (350 lb 6.4 oz)  02/15/17 (!) 155.1 kg (342 lb)    Intake/Output Summary (Last 24 hours) at 06/11/2017 1803 Last data filed at 06/11/2017 1130 Gross per 24 hour  Intake 360 ml  Output 650 ml  Net -290 ml     Physical Exam  Gen:- Awake Alert, morbidly obese, no acute distress HEENT:- [place].AT, conjunctiva irritation noted Nose- [place] 3L/min Neck-Supple Neck,No JVD,.  Lungs-no wheezing, and movement is improving CV- S1, S2 normal Abd-  +ve B.Sounds, Abd Soft, No tenderness, increased truncal adiposity Extremity/Skin:- No  edema,    Psych-affect is appropriate Neuro-no new focal deficits   Data Review:   Micro Results Recent Results (from the past 240 hour(s))  Blood culture (routine x 2)     Status: None (Preliminary result)   Collection Time: 06/08/17  2:30 PM  Result Value Ref Range Status   Specimen Description   Final    BLOOD RIGHT ANTECUBITAL Performed at [facility] [address], [place], [place] [address]    Special Requests   Final    BOTTLES DRAWN AEROBIC ONLY Blood Culture adequate volume Performed at [facility] [address] [place], [place] [address]    Culture   Final    NO GROWTH 3 DAYS Performed at [facility], [place] [address], [place], [place] [address]    Report Status PENDING  Incomplete  Blood culture (routine x 2)     Status: None (Preliminary result)   Collection Time: 06/08/17  7:38 PM  Result Value Ref Range Status   Specimen Description   Final    BLOOD RIGHT ARM Performed at [facility] [address] [place], [place] [address]    Special Requests   Final    BAA Blood Culture adequate volume Performed at [facility] [address] [place], [place] [address]    Culture   Final    NO GROWTH 2 DAYS Performed at [facility] [address], [place], [place] [address]    Report Status PENDING  Incomplete    Radiology Reports Dg Chest 2 View  Result Date: 06/08/2017 CLINICAL DATA:  Cough and fever EXAM: CHEST  2 VIEW COMPARISON:  April 25, 2017 FINDINGS: There is extensive airspace consolidation in the  left upper lobe, primarily anteriorly. The lungs elsewhere are clear. Heart is mildly enlarged with pulmonary vascularity within normal limits. No adenopathy. No bone lesions. IMPRESSION: Extensive airspace consolidation consistent with pneumonia in the left upper lobe, primarily in the anterior segment. Lungs elsewhere clear. Heart mildly enlarged. Followup PA and lateral chest radiographs recommended in 3-4 weeks following trial of antibiotic therapy to ensure resolution and exclude underlying malignancy. Electronically Signed   By: Lowella Grip III M.D.   On: 06/08/2017 14:31   Ct Abdomen Pelvis W Contrast  Result Date: 06/08/2017 CLINICAL DATA:  Coughing up blood short of breath abdominal pain vomiting EXAM: CT ABDOMEN AND PELVIS WITH CONTRAST TECHNIQUE: Multidetector CT imaging of the abdomen and pelvis was performed using the standard protocol following bolus administration of intravenous contrast. CONTRAST:  112mL ISOVUE-300 IOPAMIDOL (ISOVUE-300) INJECTION 61% COMPARISON:  CT 12/28/2016, 01/29/2016, 06/18/2014 FINDINGS: Lower chest: Trace left pleural effusion. Ground-glass density and oval region of consolidation within the posterior left lung base. Borderline cardiomegaly. Hepatobiliary: No focal hepatic abnormality. Calcified gallstone. No biliary dilatation Pancreas: Unremarkable. No pancreatic ductal dilatation or surrounding inflammatory changes. Spleen: Normal in size without focal abnormality. Adrenals/Urinary Tract: Adrenal glands are unremarkable. Kidneys are normal, without renal calculi, focal lesion, or hydronephrosis. Bladder is unremarkable. Nonspecific perinephric fat stranding Stomach/Bowel: Stomach is  within normal limits. Appendix not well seen consistent with history of appendectomy. No evidence of bowel wall thickening, distention, or inflammatory changes. Vascular/Lymphatic: No significant vascular findings are present. No enlarged abdominal or pelvic lymph nodes. Reproductive: Status post hysterectomy. No adnexal masses. Other: Negative for free air or free fluid. Prominent skin thickening of the anterior abdominal wall with mild infiltration of the subcutaneous fat Musculoskeletal: Degenerative changes. No acute or suspicious lesion. IMPRESSION: 1. Trace left pleural effusion with ground-glass density and consolidation in the left lung base. Findings likely secondary to pneumonia, however given slightly masslike configuration, recommend short interval CT follow-up in 6-12 weeks to ensure clearing. 2. No CT evidence for acute intra-abdominal or pelvic abnormality 3. Gallstone Electronically Signed   By: Donavan Foil M.D.   On: 06/08/2017 19:09     CBC Recent Labs  Lab 06/08/17 1345 06/09/17 0543 06/11/17 0629  WBC 15.9* 15.9* 9.8  HGB 14.1 13.1 12.4  HCT 42.0 40.9 39.2  PLT 141* 210 273  MCV 94.6 97.8 99.0  MCH 31.8 31.3 31.3  MCHC 33.6 32.0 31.6  RDW 12.5 12.8 12.6  LYMPHSABS 1.2 1.7  --   MONOABS 1.3* 1.4*  --   EOSABS 0.0 0.0  --   BASOSABS 0.0 0.0  --     Chemistries  Recent Labs  Lab 06/08/17 1345 06/09/17 0543 06/11/17 0629  NA 132* 137 139  K 3.7 3.8 3.8  CL 94* 99* 96*  CO2 28 30 34*  GLUCOSE 309* 229* 100*  BUN 7 9 13   CREATININE 0.89 0.96 0.82  CALCIUM 8.6* 8.5* 8.8*  AST 16  --   --   ALT 10*  --   --   ALKPHOS 94  --   --   BILITOT 1.0  --   --    ------------------------------------------------------------------------------------------------------------------ No results for input(s): CHOL, HDL, LDLCALC, TRIG, CHOLHDL, LDLDIRECT in the last 72 hours.  Lab Results  Component Value Date   HGBA1C 9.1 05/27/2017    ------------------------------------------------------------------------------------------------------------------ No results for input(s): TSH, T4TOTAL, T3FREE, THYROIDAB in the last 72 hours.  Invalid input(s): FREET3 ------------------------------------------------------------------------------------------------------------------ No results for input(s): VITAMINB12, FOLATE, FERRITIN, TIBC, IRON, RETICCTPCT in  the last 72 hours.  Coagulation profile No results for input(s): INR, PROTIME in the last 168 hours.  No results for input(s): DDIMER in the last 72 hours.  Cardiac Enzymes No results for input(s): CKMB, TROPONINI, MYOGLOBIN in the last 168 hours.  Invalid input(s): CK ------------------------------------------------------------------------------------------------------------------    Component Value Date/Time   BNP 20.9 01/29/2016 1453     Keylor Rands M.D on 06/11/2017 at 6:03 PM  Between 7am to 7pm - Pager - [phone]  After 7pm go to [email or web address] - password [email or web address]  [facility] -  Office  [phone]   Voice Recognition Viviann Spare dictation system was used to create this note, attempts have been made to correct errors. Please contact the author with questions and/or clarifications.

## 2017-06-11 NOTE — Progress Notes (Signed)
  CBG 103  

## 2017-06-11 NOTE — Progress Notes (Signed)
PHARMACIST - PHYSICIAN COMMUNICATION  CONCERNING: Antibiotic IV to Oral Route Change Policy  RECOMMENDATION: This patient is receiving azithromycin by the intravenous route.  Based on criteria approved by the Pharmacy and Therapeutics Committee, the antibiotic(s) is/are being converted to the equivalent oral dose form(s).   DESCRIPTION: These criteria include:  Patient being treated for a respiratory tract infection, urinary tract infection, cellulitis or clostridium difficile associated diarrhea if on metronidazole  The patient is not neutropenic and does not exhibit a GI malabsorption state  The patient is eating (either orally or via tube) and/or has been taking other orally administered medications for a least 24 hours  The patient is improving clinically and has a Tmax < 100.5  If you have questions about this conversion, please contact the Pharmacy Department  []   [phone] )  [facility] []   [phone] )  [facility] []   [phone] )  [facility] []   [phone] )  [facility] [x]   [phone] )  [place], [place].D. [phone] 06/11/2017 1:00 PM

## 2017-06-12 LAB — EXPECTORATED SPUTUM ASSESSMENT W REFEX TO RESP CULTURE

## 2017-06-12 LAB — EXPECTORATED SPUTUM ASSESSMENT W GRAM STAIN, RFLX TO RESP C

## 2017-06-12 LAB — GLUCOSE, CAPILLARY
GLUCOSE-CAPILLARY: 191 mg/dL — AB (ref 65–99)
Glucose-Capillary: 169 mg/dL — ABNORMAL HIGH (ref 65–99)
Glucose-Capillary: 199 mg/dL — ABNORMAL HIGH (ref 65–99)
Glucose-Capillary: 177 mg/dL — ABNORMAL HIGH (ref 65–99)

## 2017-06-12 MED ORDER — CEFDINIR 300 MG PO CAPS: 300.0000 mg | ORAL_CAPSULE | Freq: Two times a day (BID) | ORAL | 0 refills | Status: AC

## 2017-06-12 MED ORDER — GUAIFENESIN ER 600 MG PO TB12: 600.0000 mg | ORAL_TABLET | Freq: Two times a day (BID) | ORAL | 0 refills | Status: AC

## 2017-06-12 MED ORDER — OMEPRAZOLE 40 MG PO CPDR: DELAYED_RELEASE_CAPSULE | ORAL | 2 refills | Status: DC

## 2017-06-12 MED ORDER — BUPROPION HCL ER (XL) 300 MG PO TB24: 300.0000 mg | ORAL_TABLET | ORAL | 3 refills | Status: DC

## 2017-06-12 MED ORDER — ALBUTEROL SULFATE HFA 108 (90 BASE) MCG/ACT IN AERS: 2.0000 | INHALATION_SPRAY | RESPIRATORY_TRACT | 3 refills | Status: AC | PRN

## 2017-06-12 MED ORDER — ALBUTEROL SULFATE (2.5 MG/3ML) 0.083% IN NEBU: 2.5000 mg | INHALATION_SOLUTION | Freq: Three times a day (TID) | RESPIRATORY_TRACT | 3 refills | Status: DC

## 2017-06-12 MED ORDER — LACTULOSE 10 GM/15ML PO SOLN
60.0000 g | Freq: Once | ORAL | Status: AC
  Administered 2017-06-12: 60 g via ORAL
  Filled 2017-06-12: qty 90

## 2017-06-12 MED ORDER — BUDESONIDE-FORMOTEROL FUMARATE 160-4.5 MCG/ACT IN AERO: 2.0000 | INHALATION_SPRAY | Freq: Two times a day (BID) | RESPIRATORY_TRACT | 0 refills | Status: DC

## 2017-06-12 MED ORDER — AZITHROMYCIN 250 MG PO TABS: ORAL_TABLET | ORAL | 0 refills | Status: DC

## 2017-06-12 MED ORDER — IPRATROPIUM-ALBUTEROL 0.5-2.5 (3) MG/3ML IN SOLN: 3.0000 mL | Freq: Four times a day (QID) | RESPIRATORY_TRACT | 2 refills | Status: AC | PRN

## 2017-06-12 MED ORDER — AMLODIPINE BESYLATE 5 MG PO TABS: 5.0000 mg | ORAL_TABLET | Freq: Every day | ORAL | 1 refills | Status: DC

## 2017-06-12 MED ORDER — SENNOSIDES-DOCUSATE SODIUM 8.6-50 MG PO TABS: 2.0000 | ORAL_TABLET | Freq: Two times a day (BID) | ORAL | 3 refills | Status: DC

## 2017-06-12 MED ORDER — POLYETHYLENE GLYCOL 3350 17 G PO PACK: 17.0000 g | PACK | Freq: Two times a day (BID) | ORAL | 1 refills | Status: DC

## 2017-06-12 NOTE — Discharge Instructions (Signed)
1) take medications as prescribed 2) follow-up with your primary care doctor in 1-2 weeks for recheck 3)Quit Smoking !!!
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
## 2017-06-12 NOTE — Progress Notes (Signed)
[name] to be D/C'd Home per MD order.  Discussed prescriptions and follow up appointments with the patient. Prescriptions given to patient, medication list explained in detail. Pt verbalized understanding.  Allergies as of 06/12/2017      Reactions   Hydrocodone Hives   Lisinopril    REACTION: Cough   Pioglitazone    REACTION: Edema   Varenicline Tartrate    REACTION: bad dreams      Medication List    TAKE these medications   albuterol 108 (90 Base) MCG/ACT inhaler Commonly known as:  PROVENTIL HFA Inhale 2 puffs into the lungs every 4 (four) hours as needed for wheezing or shortness of breath. What changed:  See the new instructions.   albuterol (2.5 MG/3ML) 0.083% nebulizer solution Commonly known as:  PROVENTIL Take 3 mLs (2.5 mg total) by nebulization 3 (three) times daily. What changed:  You were already taking a medication with the same name, and this prescription was added. Make sure you understand how and when to take each.   amLODipine 5 MG tablet Commonly known as:  NORVASC Take 1 tablet (5 mg total) by mouth daily. What changed:    medication strength  how much to take   aspirin EC 81 MG tablet Take 81 mg by mouth daily.   azithromycin 250 MG tablet Commonly known as:  ZITHROMAX 1 daily x 3 days   budesonide-formoterol 160-4.5 MCG/ACT inhaler Commonly known as:  SYMBICORT Inhale 2 puffs into the lungs 2 (two) times daily.   buPROPion 300 MG 24 hr tablet Commonly known as:  WELLBUTRIN XL Take 1 tablet (300 mg total) by mouth every morning.   cefdinir 300 MG capsule Commonly known as:  OMNICEF Take 1 capsule (300 mg total) by mouth 2 (two) times daily for 7 days.   CLEAR EYES COMPLETE OP Place 2 drops into both eyes daily.   clotrimazole-betamethasone cream Commonly known as:  LOTRISONE Apply 1 application topically 3 (three) times daily as needed. For itching or rash   diclofenac sodium 1 % Gel Commonly known as:   VOLTAREN Apply 4 g topically 4 (four) times daily.   DSS 100 MG Caps Take 100 mg by mouth 2 (two) times daily.   famotidine 20 MG tablet Commonly known as:  PEPCID Take 1 tablet (20 mg total) by mouth 2 (two) times daily.   gabapentin 300 MG capsule Commonly known as:  NEURONTIN Take 1 capsule (300 mg total) by mouth 2 (two) times daily.   guaiFENesin 600 MG 12 hr tablet Commonly known as:  MUCINEX Take 1 tablet (600 mg total) by mouth 2 (two) times daily for 10 days.   insulin lispro 100 UNIT/ML KiwkPen Commonly known as:  HUMALOG KWIKPEN Inject 0.6 mLs (60 Units total) into the skin daily with supper. And pen needles 3/day   ipratropium-albuterol 0.5-2.5 (3) MG/3ML Soln Commonly known as:  DUONEB Take 3 mLs by nebulization every 6 (six) hours as needed.   LANTUS SOLOSTAR 100 UNIT/ML Solostar Pen Generic drug:  Insulin Glargine INJECT 100 UNITS INTO THE SKIN EVERY MORNING What changed:  Another medication with the same name was removed. Continue taking this medication, and follow the directions you see here.   lidocaine 5 % Commonly known as:  LIDODERM Place 1 patch onto the skin daily. Remove & Discard patch within 12 hours or as directed by MD   nicotine 21 mg/24hr patch Commonly known as:  NICODERM CQ Place 1 patch (21 mg total) onto the  skin daily.   omeprazole 40 MG capsule Commonly known as:  PRILOSEC TAKE 1 CAPSULE(40 MG) BY MOUTH DAILY   ondansetron 4 MG tablet Commonly known as:  ZOFRAN TAKE 1 TABLET(4 MG) BY MOUTH EVERY 8 HOURS AS NEEDED FOR NAUSEA OR VOMITING   polyethylene glycol packet Commonly known as:  MIRALAX / GLYCOLAX Take 17 g by mouth 2 (two) times daily.   senna-docusate 8.6-50 MG tablet Commonly known as:  Senokot-S Take 2 tablets by mouth 2 (two) times daily.            Durable Medical Equipment  (From admission, onward)        Start     Ordered   06/12/17 0000  DME Nebulizer machine    Question:  Patient needs a nebulizer to  treat with the following condition  Answer:  Obstructive chronic bronchitis with exacerbation ([place])   06/12/17 1620   06/12/17 0000  DME Nebulizer/meds    Question:  Patient needs a nebulizer to treat with the following condition  Answer:  Obstructive chronic bronchitis with exacerbation ([place])   06/12/17 1620   06/10/17 1439  For home use only DME Bedside commode  Once    Comments:  Bariatric [facility]  Question:  Patient needs a bedside commode to treat with the following condition  Answer:  Fear for personal safety   06/10/17 1440      Vitals:   06/12/17 0503 06/12/17 1315  BP: (!) 135/58 (!) 159/85  Pulse: 71 71  Resp: 20 (!) 21  Temp: 98.6 F (37 C) 98.8 F (37.1 C)  SpO2: 97% 100%    Skin clean, dry and intact without evidence of skin break down, no evidence of skin tears noted. IV catheter discontinued intact. Site without signs and symptoms of complications. Dressing and pressure applied. Pt denies pain at this time. No complaints noted.  An After Visit Summary was printed and given to the patient. Patient escorted via [place], and D/C home via private auto.  Haywood Lasso BSN, RN [employer] Phone [phone]

## 2017-06-13 ENCOUNTER — Telehealth: Payer: Self-pay | Admitting: Endocrinology

## 2017-06-13 LAB — CULTURE, BLOOD (ROUTINE X 2)
Culture: NO GROWTH
Special Requests: ADEQUATE

## 2017-06-13 NOTE — Telephone Encounter (Signed)
[name] Physical therapist calling stated patient just got out of the hospital, she will be needing  P/T 2 x a week for 5, evaluation follow O/T and ADL  S/T for choking Nursing and disease management Medication for social workers  These are the medications that are missing.  Albuterol Aspirin Azithromycin Cefdinir buPROPion clotrimazole-betamethasone MUCINEX nicotine Lidocaine SENOKOT DUONEB Docusate Sodium (DSS  They need an updated medication list  Phone # [phone]  Fax# [phone]

## 2017-06-13 NOTE — Telephone Encounter (Signed)
The forms have not came, LVM for pt stating that information.

## 2017-06-13 NOTE — Telephone Encounter (Signed)
Please advise on below  

## 2017-06-13 NOTE — Telephone Encounter (Signed)
Ov needed < 1 week

## 2017-06-13 NOTE — Telephone Encounter (Signed)
Pt's son Melenda Bielak is calling to verify that we have received FMLA form that was fxd to [place] last week from [employer]-Please advise that this has been done ph# [phone]

## 2017-06-14 ENCOUNTER — Other Ambulatory Visit: Payer: Medicare Other

## 2017-06-14 LAB — CULTURE, BLOOD (ROUTINE X 2)
Culture: NO GROWTH
SPECIAL REQUESTS: ADEQUATE

## 2017-06-14 NOTE — Telephone Encounter (Signed)
Patient son stated some one called left message for his mom returning your call. Please advise

## 2017-06-14 NOTE — Telephone Encounter (Signed)
LMTCB to make appt  

## 2017-06-14 NOTE — Telephone Encounter (Signed)
Appt made per other TC and son is bringing FMLA form to office

## 2017-06-16 ENCOUNTER — Telehealth: Payer: Self-pay | Admitting: Endocrinology

## 2017-06-16 NOTE — Telephone Encounter (Signed)
Patient and son are checking status of FMLA form that was dropped off here on 06/14/17

## 2017-06-17 ENCOUNTER — Telehealth: Payer: Self-pay | Admitting: Endocrinology

## 2017-06-17 NOTE — Telephone Encounter (Signed)
Please advise on below  

## 2017-06-17 NOTE — Telephone Encounter (Signed)
I do not have this form?

## 2017-06-17 NOTE — Telephone Encounter (Signed)
error 

## 2017-06-20 NOTE — Telephone Encounter (Signed)
Provider does not have form. Insurance account manager. Please help locate. Thanks

## 2017-06-20 NOTE — Telephone Encounter (Signed)
[name] calling from [facility] is calling on the status P/T work up after hospital visit, please advise [phone]

## 2017-06-21 ENCOUNTER — Encounter: Payer: Self-pay | Admitting: Physical Medicine & Rehabilitation

## 2017-06-21 NOTE — Telephone Encounter (Signed)
I have called & left VM with [name] trying to find out more info. About paperwork.

## 2017-06-21 NOTE — Telephone Encounter (Signed)
I spoke with Clair Gulling Hoffman(PT) he is recommending that the patient have in home nursing care, which needs referral.. He needs verbal for speech therapy, OT 2x weekly for 5 weeks. Ok to call back to give verbal? [name] [phone].

## 2017-06-21 NOTE — Telephone Encounter (Signed)
OK 

## 2017-06-21 NOTE — Telephone Encounter (Signed)
Called and gave verbal orders to Cairo Hoffman(PT).

## 2017-06-22 ENCOUNTER — Telehealth: Payer: Self-pay | Admitting: Emergency Medicine

## 2017-06-22 ENCOUNTER — Other Ambulatory Visit: Payer: Medicare Other

## 2017-06-22 NOTE — Telephone Encounter (Signed)
Patient has a blood pressure of 204/100, has a headache across her forehead and temples, blurry vision. [facility] nurse is recommending ER to patient. Please give nurse a call back thanks. Please advise

## 2017-06-22 NOTE — Telephone Encounter (Signed)
OK 

## 2017-06-22 NOTE — Telephone Encounter (Signed)
Also patient's BP came down to 160/70 & patient is advised if headache comes back or BP goes back to go to ER.

## 2017-06-22 NOTE — Telephone Encounter (Signed)
I called and spoke with nurse from [place]. She was wanting verbal to see patient again just once within the next month. She also was wondering if patient could possibly be referred to GI or maybe there was a reason she hadn't been before? She feels that patient's trouble swallowing is esophogeal?

## 2017-06-23 ENCOUNTER — Telehealth: Payer: Self-pay

## 2017-06-23 ENCOUNTER — Ambulatory Visit: Payer: Medicare Other | Admitting: Physical Medicine & Rehabilitation

## 2017-06-23 DIAGNOSIS — Z0279 Encounter for issue of other medical certificate: Secondary | ICD-10-CM

## 2017-06-23 NOTE — Telephone Encounter (Signed)
I spoke with patient's son to let him know FMLA paperwork was ready. I have faxed to [employer] for him & he stated he would pick up copy at front desk in accordion folder.

## 2017-06-24 ENCOUNTER — Telehealth: Payer: Self-pay | Admitting: Endocrinology

## 2017-06-24 NOTE — Telephone Encounter (Signed)
Patient son states that the FMLA form was sent back to [place] , that it was not completed. Would like to know status of this paper. ter  Please advise

## 2017-06-24 NOTE — Telephone Encounter (Signed)
I called and informed patient's son that FMLA papers were corrected & I faxed.

## 2017-06-29 ENCOUNTER — Encounter: Payer: Self-pay | Admitting: Endocrinology

## 2017-06-29 ENCOUNTER — Telehealth: Payer: Self-pay | Admitting: Endocrinology

## 2017-06-29 ENCOUNTER — Ambulatory Visit: Payer: Medicare Other | Admitting: Endocrinology

## 2017-06-29 ENCOUNTER — Ambulatory Visit: Payer: Self-pay | Admitting: Endocrinology

## 2017-06-29 VITALS — BP 126/74 | HR 70

## 2017-06-29 DIAGNOSIS — E1122 Type 2 diabetes mellitus with diabetic chronic kidney disease: Secondary | ICD-10-CM | POA: Diagnosis not present

## 2017-06-29 DIAGNOSIS — N183 Chronic kidney disease, stage 3 (moderate): Secondary | ICD-10-CM

## 2017-06-29 DIAGNOSIS — Z794 Long term (current) use of insulin: Secondary | ICD-10-CM

## 2017-06-29 MED ORDER — INSULIN LISPRO 100 UNIT/ML (KWIKPEN): 70.0000 [IU] | PEN_INJECTOR | Freq: Every day | SUBCUTANEOUS | 11 refills | Status: DC

## 2017-06-29 MED ORDER — TRIAMCINOLONE ACETONIDE 0.5 % EX OINT: 1.0000 "application " | TOPICAL_OINTMENT | Freq: Four times a day (QID) | CUTANEOUS | 2 refills | Status: DC
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
## 2017-06-29 NOTE — Patient Instructions (Addendum)
I have sent a prescription to your pharmacy, for the skin cream Please increase the humalog to 70 units with supper, and: Please continue the same lantus.  check your blood sugar twice a day.  vary the time of day when you check, between before the 3 meals, and at bedtime.  also check if you have symptoms of your blood sugar being too high or too low.  please keep a record of the readings and bring it to your next appointment here (or you can bring the meter itself).  You can write it on any piece of paper.  please call us sooner if your blood sugar goes below 70, or if you have a lot of readings over 200. Please take nicotine gum to help you quit smoking.  If necessary, take 3-4 pieces at a time, to stop the craving.   Please come back for a follow-up appointment in 1 month.

## 2017-06-29 NOTE — Telephone Encounter (Signed)
Patient's son,AJ,said he's returning a call about FMLA paperwork. Please call AJ back at [phone].

## 2017-06-29 NOTE — Telephone Encounter (Signed)
I left detailed VM that I have resent form to [employer] with attention Dimas Aguas. I did write in dates as requested on the form so hopefully FMLA will be approved.

## 2017-07-01 ENCOUNTER — Telehealth: Payer: Self-pay | Admitting: Endocrinology

## 2017-07-01 NOTE — Telephone Encounter (Signed)
patient need verbal order for the nurse to go out see patient  For three weeks. Please Loleta Dicker from [facility] P# [phone]

## 2017-07-01 NOTE — Telephone Encounter (Signed)
Left detailed mess giving [name] verbal order for below.

## 2017-07-06 ENCOUNTER — Ambulatory Visit
Admission: RE | Admit: 2017-07-06 | Discharge: 2017-07-06 | Disposition: A | Discharge status: Home / Self Care | Payer: Medicare Other | Source: Ambulatory Visit | Attending: Endocrinology | Admitting: Endocrinology

## 2017-07-06 DIAGNOSIS — R1013 Epigastric pain: Secondary | ICD-10-CM

## 2017-07-06 MED ORDER — IOPAMIDOL (ISOVUE-300) INJECTION 61%
100.0000 mL | Freq: Once | INTRAVENOUS | Status: AC | PRN
  Administered 2017-07-06: 125 mL via INTRAVENOUS

## 2017-07-08 ENCOUNTER — Telehealth: Payer: Self-pay | Admitting: Endocrinology

## 2017-07-08 NOTE — Telephone Encounter (Signed)
I have called and left detailed VM with instructions to go to ER if BP goes higher or she has SOB or nuerolgical sxs. I asked that he try to call me back before 5 pm.

## 2017-07-08 NOTE — Telephone Encounter (Signed)
Ov 07/11/17.  ER sooner if sob or neurol sxs

## 2017-07-08 NOTE — Telephone Encounter (Signed)
Danny from [facility] calling on the status of patient asking for a phone call. Please advise [phone]

## 2017-07-08 NOTE — Telephone Encounter (Signed)
[name] with [facility] is calling in regards to patients blood pressure. He has checked it 3 times and it is reading 189/90 States patient has migraine, nut no slur speech or dizziness.    Please advise [name] [phone]

## 2017-07-08 NOTE — Telephone Encounter (Signed)
[facility] calling about medication for patient. They state patient is retaining fluid but they were not aware of any medication for this.  And patient stated to them that our dr has put her on something for this before  They would like to speak to someone to clarify this.    [phone] [place].

## 2017-07-11 ENCOUNTER — Telehealth: Payer: Self-pay | Admitting: Endocrinology

## 2017-07-11 NOTE — Telephone Encounter (Signed)
[name] from [facility], need order for stage 1 pressure ulcers on the back of her legs form sitting, and on front of her legs.  Call [phone]

## 2017-07-11 NOTE — Telephone Encounter (Signed)
I tried to call [name] at number provided but had to leave VM.

## 2017-07-11 NOTE — Telephone Encounter (Signed)
OK 

## 2017-07-12 ENCOUNTER — Emergency Department (HOSPITAL_COMMUNITY): Payer: Medicare Other

## 2017-07-12 ENCOUNTER — Other Ambulatory Visit: Payer: Self-pay

## 2017-07-12 ENCOUNTER — Emergency Department (HOSPITAL_COMMUNITY)
Admission: EM | Admit: 2017-07-12 | Discharge: 2017-07-12 | Disposition: A | Discharge status: Home / Self Care | Payer: Medicare Other | Attending: Emergency Medicine | Admitting: Emergency Medicine

## 2017-07-12 DIAGNOSIS — J449 Chronic obstructive pulmonary disease, unspecified: Secondary | ICD-10-CM | POA: Diagnosis not present

## 2017-07-12 DIAGNOSIS — I5032 Chronic diastolic (congestive) heart failure: Secondary | ICD-10-CM | POA: Diagnosis not present

## 2017-07-12 DIAGNOSIS — Z79899 Other long term (current) drug therapy: Secondary | ICD-10-CM | POA: Diagnosis not present

## 2017-07-12 DIAGNOSIS — E119 Type 2 diabetes mellitus without complications: Secondary | ICD-10-CM | POA: Insufficient documentation

## 2017-07-12 DIAGNOSIS — R911 Solitary pulmonary nodule: Secondary | ICD-10-CM | POA: Diagnosis not present

## 2017-07-12 DIAGNOSIS — R1012 Left upper quadrant pain: Secondary | ICD-10-CM | POA: Insufficient documentation

## 2017-07-12 DIAGNOSIS — R531 Weakness: Secondary | ICD-10-CM | POA: Diagnosis present

## 2017-07-12 DIAGNOSIS — R05 Cough: Secondary | ICD-10-CM

## 2017-07-12 DIAGNOSIS — B372 Candidiasis of skin and nail: Secondary | ICD-10-CM | POA: Diagnosis not present

## 2017-07-12 DIAGNOSIS — Z794 Long term (current) use of insulin: Secondary | ICD-10-CM | POA: Diagnosis not present

## 2017-07-12 DIAGNOSIS — I110 Hypertensive heart disease with heart failure: Secondary | ICD-10-CM | POA: Diagnosis not present

## 2017-07-12 DIAGNOSIS — F17210 Nicotine dependence, cigarettes, uncomplicated: Secondary | ICD-10-CM | POA: Diagnosis not present

## 2017-07-12 DIAGNOSIS — G8929 Other chronic pain: Secondary | ICD-10-CM | POA: Insufficient documentation

## 2017-07-12 DIAGNOSIS — Z7982 Long term (current) use of aspirin: Secondary | ICD-10-CM | POA: Insufficient documentation

## 2017-07-12 DIAGNOSIS — R059 Cough, unspecified: Secondary | ICD-10-CM

## 2017-07-12 DIAGNOSIS — I10 Essential (primary) hypertension: Secondary | ICD-10-CM | POA: Insufficient documentation

## 2017-07-12 LAB — BASIC METABOLIC PANEL
ANION GAP: 11 (ref 5–15)
BUN: 9 mg/dL (ref 6–20)
CALCIUM: 8.7 mg/dL — AB (ref 8.9–10.3)
CO2: 25 mmol/L (ref 22–32)
Chloride: 99 mmol/L — ABNORMAL LOW (ref 101–111)
Creatinine, Ser: 0.87 mg/dL (ref 0.44–1.00)
GFR calc Af Amer: >60 mL/min (ref >60)
GFR calc non Af Amer: >60 mL/min (ref >60)
GLUCOSE: 263 mg/dL — AB (ref 65–99)
Potassium: 5.8 mmol/L — ABNORMAL HIGH (ref 3.5–5.1)
Sodium: 135 mmol/L (ref 135–145)

## 2017-07-12 LAB — CBC
HCT: 41 % (ref 36.0–46.0)
HEMOGLOBIN: 13.4 g/dL (ref 12.0–15.0)
MCH: 31.4 pg (ref 26.0–34.0)
MCHC: 32.7 g/dL (ref 30.0–36.0)
MCV: 96 fL (ref 78.0–100.0)
Platelets: 246 10*3/uL (ref 150–400)
RBC: 4.27 MIL/uL (ref 3.87–5.11)
RDW: 13.9 % (ref 11.5–15.5)
WBC: 6.7 10*3/uL (ref 4.0–10.5)

## 2017-07-12 LAB — BRAIN NATRIURETIC PEPTIDE: B Natriuretic Peptide: 105.5 pg/mL — ABNORMAL HIGH (ref 0.0–100.0)

## 2017-07-12 LAB — URINALYSIS, ROUTINE W REFLEX MICROSCOPIC
Bilirubin Urine: NEGATIVE
Glucose, UA: NEGATIVE mg/dL
HGB URINE DIPSTICK: NEGATIVE
Ketones, ur: NEGATIVE mg/dL
Nitrite: NEGATIVE
Protein, ur: 30 mg/dL — AB
SPECIFIC GRAVITY, URINE: 1.012 (ref 1.005–1.030)
pH: 7 (ref 5.0–8.0)

## 2017-07-12 LAB — CBG MONITORING, ED: GLUCOSE-CAPILLARY: 247 mg/dL — AB (ref 65–99)

## 2017-07-12 LAB — I-STAT CHEM 8, ED
BUN: 9 mg/dL (ref 6–20)
CALCIUM ION: 1.16 mmol/L (ref 1.15–1.40)
CHLORIDE: 97 mmol/L — AB (ref 101–111)
CREATININE: 0.8 mg/dL (ref 0.44–1.00)
GLUCOSE: 193 mg/dL — AB (ref 65–99)
HCT: 46 % (ref 36.0–46.0)
Hemoglobin: 15.6 g/dL — ABNORMAL HIGH (ref 12.0–15.0)
Potassium: 3.5 mmol/L (ref 3.5–5.1)
Sodium: 140 mmol/L (ref 135–145)
TCO2: 32 mmol/L (ref 22–32)

## 2017-07-12 LAB — I-STAT TROPONIN, ED: TROPONIN I, POC: 0.01 ng/mL (ref 0.00–0.08)

## 2017-07-12 LAB — LIPASE, BLOOD: LIPASE: 33 U/L (ref 11–51)

## 2017-07-12 LAB — I-STAT CG4 LACTIC ACID, ED: Lactic Acid, Venous: 1.71 mmol/L (ref 0.5–1.9)

## 2017-07-12 MED ORDER — FENTANYL CITRATE (PF) 100 MCG/2ML IJ SOLN
50.0000 ug | Freq: Once | INTRAMUSCULAR | Status: AC
  Administered 2017-07-12: 50 ug via INTRAVENOUS
  Filled 2017-07-12: qty 2

## 2017-07-12 MED ORDER — TRAMADOL HCL 50 MG PO TABS: 50.0000 mg | ORAL_TABLET | Freq: Four times a day (QID) | ORAL | 0 refills | Status: DC | PRN

## 2017-07-12 MED ORDER — IOPAMIDOL (ISOVUE-370) INJECTION 76%
INTRAVENOUS | Status: AC
  Administered 2017-07-12: 100 mL
  Filled 2017-07-12: qty 100

## 2017-07-12 MED ORDER — KETOCONAZOLE 2 % EX CREA: 1.0000 "application " | TOPICAL_CREAM | Freq: Every day | CUTANEOUS | 3 refills | Status: DC

## 2017-07-12 MED ORDER — ONDANSETRON HCL 4 MG/2ML IJ SOLN
4.0000 mg | Freq: Once | INTRAMUSCULAR | Status: AC
  Administered 2017-07-12: 4 mg via INTRAVENOUS
  Filled 2017-07-12: qty 2

## 2017-07-12 NOTE — Discharge Instructions (Addendum)
Contact a health care provider if:  You have new symptoms.  You cough up pus.  Your cough does not get better after 2-3 weeks, or your cough gets worse.  You cannot control your cough with suppressant medicines and you are losing sleep.  You develop pain that is getting worse or pain that is not controlled with pain medicines.  You have a fever.  You have unexplained weight loss.  You have night sweats.  Get help right away if:  You cough up blood.  You have difficulty breathing.  Your heartbeat is very fast.

## 2017-07-12 NOTE — Telephone Encounter (Signed)
I called and gave verbal for nystatin cream. I also let Sharee Pimple from [facility] know that patient went to ED & that had ordered UA plus series of labs.

## 2017-07-12 NOTE — Telephone Encounter (Signed)
I spoke with nurse, Sharee Pimple from [facility] & she stated that for her pressure ulcers on the back of her thighs she was needing an antifingal sent in or a nystatin cream. She is also very concerned when patient's labia is touched she screams in pain. She was wanting a UA done & she said if we need sample they can get? Please advise?

## 2017-07-12 NOTE — ED Notes (Signed)
IV team at bedside 

## 2017-07-12 NOTE — ED Notes (Signed)
PT sitting up in bed eating a sandwich with no complaints.

## 2017-07-12 NOTE — ED Provider Notes (Signed)
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 closely with pulmonology.  She otherwise appears appropriate for discharge.  I reviewed her labs and imaging.  Final Clinical Impressions(s) / ED Diagnoses   Final diagnoses:  Other chronic pain  Cough  Hypertension, unspecified type  Candidal intertrigo  Incidental lung nodule, > 9m and < 835m   ED Discharge Orders        Ordered    ketoconazole (NIZORAL) 2 % cream  Daily     07/12/17 1808    traMADol (ULTRAM) 50 MG tablet  Every 6 hours PRN     07/12/17 1840       HaMargarita MailPA-C 07/12/17 2235    PiDavonna BellingMD 07/13/17 1558

## 2017-07-12 NOTE — ED Notes (Signed)
Provider at bedside

## 2017-07-12 NOTE — ED Notes (Signed)
Nurse tech attempted to draw blood unsuccessful. Nurse notified phlebotomy to draw blood.

## 2017-07-12 NOTE — ED Triage Notes (Addendum)
Arrived via EMS from home. Home health nurse called EMS patient having HTN and headache for couple of weeks and today dizziness with nausea and emesis x 1 last night. Patient alert answering and following commands appropriate. States lower back pain and under left breast.

## 2017-07-12 NOTE — ED Notes (Signed)
Pt's daughter just arrived to drive her home. She comes to desk and says that PT is crying, complains of abdominal pain, and bilateral leg pain. Alfredia Client, PA speaking to daughter in hallway

## 2017-07-12 NOTE — Telephone Encounter (Signed)
Both are OK

## 2017-07-13 ENCOUNTER — Encounter: Payer: Medicare Other | Admitting: Physical Medicine & Rehabilitation

## 2017-07-14 ENCOUNTER — Telehealth: Payer: Self-pay | Admitting: Endocrinology

## 2017-07-17 LAB — CULTURE, BLOOD (ROUTINE X 2)
Culture: NO GROWTH
Culture: NO GROWTH
SPECIAL REQUESTS: ADEQUATE
SPECIAL REQUESTS: ADEQUATE

## 2017-07-20 ENCOUNTER — Encounter: Payer: Self-pay | Admitting: Endocrinology

## 2017-07-20 ENCOUNTER — Ambulatory Visit (INDEPENDENT_AMBULATORY_CARE_PROVIDER_SITE_OTHER): Payer: Medicare Other | Admitting: Endocrinology

## 2017-07-20 VITALS — BP 154/94 | HR 67 | Temp 98.3°F | Ht 72.0 in | Wt 343.3 lb

## 2017-07-20 DIAGNOSIS — N183 Chronic kidney disease, stage 3 (moderate): Secondary | ICD-10-CM | POA: Diagnosis not present

## 2017-07-20 DIAGNOSIS — E1122 Type 2 diabetes mellitus with diabetic chronic kidney disease: Secondary | ICD-10-CM | POA: Diagnosis not present

## 2017-07-20 DIAGNOSIS — R112 Nausea with vomiting, unspecified: Secondary | ICD-10-CM

## 2017-07-20 DIAGNOSIS — Z794 Long term (current) use of insulin: Secondary | ICD-10-CM | POA: Diagnosis not present

## 2017-07-20 DIAGNOSIS — R197 Diarrhea, unspecified: Secondary | ICD-10-CM

## 2017-07-20 DIAGNOSIS — J189 Pneumonia, unspecified organism: Secondary | ICD-10-CM | POA: Diagnosis not present

## 2017-07-20 LAB — POCT GLYCOSYLATED HEMOGLOBIN (HGB A1C): Hemoglobin A1C: 9.7

## 2017-07-20 MED ORDER — TRAMADOL HCL 50 MG PO TABS: 50.0000 mg | ORAL_TABLET | Freq: Four times a day (QID) | ORAL | 0 refills | Status: DC | PRN

## 2017-07-20 MED ORDER — TRAMADOL HCL 50 MG PO TABS: 50.0000 mg | ORAL_TABLET | Freq: Four times a day (QID) | ORAL | 2 refills | Status: DC | PRN

## 2017-07-20 MED ORDER — LOSARTAN POTASSIUM-HCTZ 50-12.5 MG PO TABS: 1.0000 | ORAL_TABLET | Freq: Every day | ORAL | 11 refills | Status: DC

## 2017-07-20 MED ORDER — GLUCOSE BLOOD VI STRP: 1.0000 | ORAL_STRIP | Freq: Two times a day (BID) | 12 refills | Status: DC
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
## 2017-07-20 NOTE — Patient Instructions (Addendum)
Please continue the same insulins for now.  Try eating a light snack at bedtime, to help avoid low blood sugar in the middle of the night. I have again requested the palliative care visit.  Please see a specialist.  you will receive a phone call, about this.  Please see a specialist, for the nausea.  you will receive a phone call, about a day and time for an appointment. I have sent a prescription to your pharmacy, for an additional blood pressure pill, and to refill the pain medication.  Here is a new blood sugar meter.  I have sent a prescription to your pharmacy, for strips.   Please come back for a follow-up appointment in 1 month.

## 2017-07-26 ENCOUNTER — Telehealth: Payer: Self-pay | Admitting: Endocrinology

## 2017-07-26 NOTE — Telephone Encounter (Signed)
OK 

## 2017-07-26 NOTE — Telephone Encounter (Signed)
[name] from [facility] is asking for a verbal order and more time with this patient, patient was not being properly taken care of by last nurse, patient taking wrong medication,etc... [name] would like an extension for 1 x a week for 3 weeks.   She said you can leave a verbal on her voice mail

## 2017-07-28 ENCOUNTER — Ambulatory Visit: Payer: Medicare Other | Admitting: Endocrinology

## 2017-07-28 DIAGNOSIS — Z0289 Encounter for other administrative examinations: Secondary | ICD-10-CM

## 2017-07-28 NOTE — Telephone Encounter (Signed)
I called and left a VM for [name] that I was caliing to give her verbal orders. I asked that she call back if she needed to further speak to anyone.

## 2017-08-08 ENCOUNTER — Ambulatory Visit (INDEPENDENT_AMBULATORY_CARE_PROVIDER_SITE_OTHER): Payer: Medicare Other | Admitting: Podiatry

## 2017-08-08 DIAGNOSIS — I70235 Atherosclerosis of native arteries of right leg with ulceration of other part of foot: Secondary | ICD-10-CM

## 2017-08-08 DIAGNOSIS — E0843 Diabetes mellitus due to underlying condition with diabetic autonomic (poly)neuropathy: Secondary | ICD-10-CM | POA: Diagnosis not present

## 2017-08-08 DIAGNOSIS — L97512 Non-pressure chronic ulcer of other part of right foot with fat layer exposed: Secondary | ICD-10-CM

## 2017-08-08 MED ORDER — GENTAMICIN SULFATE 0.1 % EX CREA: 1.0000 "application " | TOPICAL_CREAM | Freq: Three times a day (TID) | CUTANEOUS | 1 refills | Status: DC

## 2017-08-10 ENCOUNTER — Telehealth: Payer: Self-pay | Admitting: Endocrinology

## 2017-08-10 NOTE — Telephone Encounter (Signed)
Please forward message about GI ref to [facility].  We don't need medication management, thanks.

## 2017-08-10 NOTE — Telephone Encounter (Signed)
[facility] calling in regards to patient and would like to know if the Dr would like to Extend nursing orders. And continue with COPD, DM, CHS medication management   Patient also mentioned that she was suppose to see a GI specialist but have not received a call about this and they would like to follow up   Please advise May leave detailed message on VM.   [name].  [phone]

## 2017-08-10 NOTE — Telephone Encounter (Signed)
Fine wit help put meds in boxes, but we don't need management.

## 2017-08-10 NOTE — Telephone Encounter (Signed)
I called and spoke with [name] & she felt that patient needed medication management because when she goes in to check on patient her meds aren't distributed in pill box properly. Also, she doesn't take meds on time. I have forwarded a message to [facility] & I will Colletta [place] as well regarding her GI referral. Patient complains of some nausea & has not had a bowl movement in a week. She has a distended left side & is uncomfortable. [name], her nurse feels she may have motility issues with bowls & may even benefit from Reglan. Please see notes & further advise?

## 2017-08-10 NOTE — Telephone Encounter (Signed)
Ok to give orders, please advise?

## 2017-08-10 NOTE — Progress Notes (Signed)
Subjective:  77 year old female with PMHx of T2DM presenting today with a chief complaint of an ulceration of the plantar aspect of the right foot. There are no modifying factors noted. She denies any other complaints at this time. Patient is here for further evaluation and treatment.    Past Medical History:  Diagnosis Date  . ALLERGIC RHINITIS 10/29/2006  . Arthritis    "hands, feet, legs, arms" (11/22/2012)  . ASYMPTOMATIC POSTMENOPAUSAL STATUS 02/22/2008  . 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 CHEST PAIN 02/22/2008   LHC in 7/05 and 1/11: normal; Myoview in 11/10 that demonstrated an EF of 51% and slight reversible anterior and septal defect of borderline significance (false + test);  Echo 04/2009:  Mild LVH, EF 55-60%, Gr 1 DD, MAC.    [name] CHF (congestive heart failure) ([place])   . Cholelithiasis   . Chronic back pain   . CIRRHOSIS 10/29/2006  . COLONIC POLYPS 09/13/2007  . COPD 04/22/2009  . DEGENERATIVE JOINT DISEASE 06/15/2007  . DEPRESSION 02/22/2008  . Diverticulosis   . Fatty liver   . Gastritis   . Gastroparesis   . GERD 10/29/2006  . Headache(784.0)    "not everyday now" (11/22/2012)  . HYPERCHOLESTEROLEMIA 02/25/2009  . HYPERTENSION 12/20/2008  . Migraine   . Myocardial infarction ([place])   . Obesity   . OSA (obstructive sleep apnea)    "quit wearing my CPAP" (11/22/2012)  . OSTEOPOROSIS 10/29/2006  . SEIZURE DISORDER    "silent seizures" (11/22/2012)  . Shortness of breath    "off and on" (11/22/2012)  . Stroke [facility]) 04/2004   Jerrol Banana 04/22/2004; "still weaker on the right" (11/22/2012)  . Type II diabetes mellitus ([place])     Objective/Physical Exam General: The patient is alert and oriented x3 in no acute distress.  Dermatology:  Wound #1 noted to the right foot measuring 0.6 x 0.6 x 0.2 cm (LxWxD).   To the noted ulceration(s), there is no eschar. There is a moderate amount of slough, fibrin, and necrotic tissue noted. Granulation tissue and wound base is  red. There is a minimal amount of serosanguineous drainage noted. There is no exposed bone muscle-tendon ligament or joint. There is no malodor. Periwound integrity is intact. Skin is warm, dry and supple bilateral lower extremities.  Vascular: Palpable pedal pulses bilaterally. No edema or erythema noted. Capillary refill within normal limits.  Neurological: Epicritic and protective threshold diminished bilaterally.   Musculoskeletal Exam: Range of motion within normal limits to all pedal and ankle joints bilateral. Muscle strength 5/5 in all groups bilateral.   Assessment: #1 ulceration of the right foot secondary to diabetes mellitus #2 diabetes mellitus w/ peripheral neuropathy   Plan of Care:  #1 Patient was evaluated. #2 medically necessary excisional debridement including subcutaneous tissue was performed using a tissue nipper and a chisel blade. Excisional debridement of all the necrotic nonviable tissue down to healthy bleeding viable tissue was performed with post-debridement measurements same as pre-. #3 the wound was cleansed and dry sterile dressing applied. #4 Prescription for Gentamicin cream provided to patient to be used daily with a Band-Aid.  #5 patient is to return to clinic in 3 weeks.   Edrick Kins, DPM [facility]  Dr. Edrick Kins, DPM    [address]  [place], [place] [address]                Office [phone]  Fax [phone]

## 2017-08-12 ENCOUNTER — Other Ambulatory Visit: Payer: Self-pay | Admitting: Endocrinology

## 2017-08-12 DIAGNOSIS — Z1231 Encounter for screening mammogram for malignant neoplasm of breast: Secondary | ICD-10-CM

## 2017-08-12 NOTE — Telephone Encounter (Signed)
Ok thank you 

## 2017-08-12 NOTE — Telephone Encounter (Signed)
Looked into the GI referral GI tried to call her once on 4/5 and had to LM so I sent an inbasket message to GI asking them to call her again and let me know if she doesn't anwer.

## 2017-08-18 ENCOUNTER — Encounter: Payer: Self-pay | Admitting: Gastroenterology

## 2017-08-19 ENCOUNTER — Ambulatory Visit: Payer: Medicare Other | Admitting: Endocrinology

## 2017-08-19 DIAGNOSIS — Z0289 Encounter for other administrative examinations: Secondary | ICD-10-CM

## 2017-08-24 ENCOUNTER — Other Ambulatory Visit: Payer: Self-pay

## 2017-08-24 ENCOUNTER — Telehealth: Payer: Self-pay | Admitting: Endocrinology

## 2017-08-24 NOTE — Telephone Encounter (Signed)
Needs ov next available.  

## 2017-08-24 NOTE — Telephone Encounter (Signed)
I have tried to call patient & patient's family to schedule patient appointment. I left a VM with daughter [name] asking her to please call us back or have mother call to make appt.

## 2017-08-24 NOTE — Telephone Encounter (Signed)
[name] from [facility] stated the patient has been having some weight gain,  April 5th 337, May 2 nd 340,  may 8th 344, ankles are also swelling 26.5 cm,  today 29.5.  She also need  refill of medication Amipiza 24 mcg, Samotidine 20 mg,  Dicyclomine 20 mg.   Phone number [phone]  Pharmacy:  [place] DEA #:  [id number]

## 2017-08-24 NOTE — Telephone Encounter (Signed)
Please advise I also don't see these mediations on patient's current med list & I am unsure on what should be filled?

## 2017-08-25 ENCOUNTER — Institutional Professional Consult (permissible substitution): Payer: Medicare Other | Admitting: Internal Medicine

## 2017-08-25 NOTE — Telephone Encounter (Signed)
Patient has appointment tomorrow at 2pm.

## 2017-08-26 ENCOUNTER — Ambulatory Visit: Payer: Medicare Other | Admitting: Endocrinology

## 2017-08-29 ENCOUNTER — Ambulatory Visit: Payer: Medicare Other | Admitting: Podiatry

## 2017-09-07 ENCOUNTER — Ambulatory Visit
Admission: RE | Admit: 2017-09-07 | Discharge: 2017-09-07 | Disposition: A | Discharge status: Home / Self Care | Payer: Medicare Other | Source: Ambulatory Visit | Attending: Endocrinology | Admitting: Endocrinology

## 2017-09-07 DIAGNOSIS — Z1231 Encounter for screening mammogram for malignant neoplasm of breast: Secondary | ICD-10-CM

## 2017-09-08 ENCOUNTER — Telehealth: Payer: Self-pay | Admitting: Endocrinology

## 2017-09-08 NOTE — Telephone Encounter (Signed)
Juliann Pulse from [facility]  asking if patient have a diagnosis of congestive failer, please advise [phone]

## 2017-09-08 NOTE — Telephone Encounter (Signed)
I called back Juliann Pulse at ext. [phone] & asked that she call back. Patient does have diagnosis of CHF.

## 2017-09-08 NOTE — Telephone Encounter (Signed)
No chf

## 2017-09-08 NOTE — Telephone Encounter (Signed)
I could not find what nurse from [facility] was asking for. It was the EF% on patient's EKG. Can you see something I cant? Patient was referred to her for CHF.

## 2017-09-09 NOTE — Telephone Encounter (Signed)
LVM for Juliann Pulse to call back if she had any questions, but that patient does not have CHF.

## 2017-09-14 ENCOUNTER — Ambulatory Visit: Payer: Medicare Other | Admitting: Podiatry

## 2017-09-14 ENCOUNTER — Encounter: Payer: Self-pay | Admitting: Podiatry

## 2017-09-14 VITALS — Temp 98.6°F

## 2017-09-14 DIAGNOSIS — L97522 Non-pressure chronic ulcer of other part of left foot with fat layer exposed: Secondary | ICD-10-CM

## 2017-09-14 DIAGNOSIS — E0843 Diabetes mellitus due to underlying condition with diabetic autonomic (poly)neuropathy: Secondary | ICD-10-CM

## 2017-09-14 DIAGNOSIS — L97512 Non-pressure chronic ulcer of other part of right foot with fat layer exposed: Secondary | ICD-10-CM

## 2017-09-14 DIAGNOSIS — I70235 Atherosclerosis of native arteries of right leg with ulceration of other part of foot: Secondary | ICD-10-CM | POA: Diagnosis not present

## 2017-09-15 ENCOUNTER — Telehealth: Payer: Self-pay | Admitting: *Deleted

## 2017-09-15 DIAGNOSIS — I70235 Atherosclerosis of native arteries of right leg with ulceration of other part of foot: Secondary | ICD-10-CM

## 2017-09-15 DIAGNOSIS — E0843 Diabetes mellitus due to underlying condition with diabetic autonomic (poly)neuropathy: Secondary | ICD-10-CM

## 2017-09-15 DIAGNOSIS — L97522 Non-pressure chronic ulcer of other part of left foot with fat layer exposed: Secondary | ICD-10-CM

## 2017-09-15 DIAGNOSIS — L97512 Non-pressure chronic ulcer of other part of right foot with fat layer exposed: Secondary | ICD-10-CM

## 2017-09-15 NOTE — Telephone Encounter (Signed)
Doppler orders faxed to lab and referral faxed to main scheduling fax [place].

## 2017-09-15 NOTE — Telephone Encounter (Signed)
-----   Message from Edrick Kins, DPM sent at 09/14/2017  3:14 PM EDT ----- Regarding: ABI bilateral and vascular referral Please order abi's b/l LE and vascular referral.   Dx: DFU b/l. PVD b/l LE.   Thanks, Dr. Amalia Hailey

## 2017-09-18 NOTE — Progress Notes (Signed)
Subjective:  77 year old female with PMHx of T2DM presenting today for follow up evaluation of an ulceration of the right foot. She now reports a wound to the left hallux that has been present for the past week. She reports associated drainage from this wound. She also noted numbness to the left third toe. She has been applying gentamicin cream as directed. Patient is here for further evaluation and treatment.    Past Medical History:  Diagnosis Date  . ALLERGIC RHINITIS 10/29/2006  . Arthritis    "hands, feet, legs, arms" (11/22/2012)  . ASYMPTOMATIC POSTMENOPAUSAL STATUS 02/22/2008  . 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 CHEST PAIN 02/22/2008   LHC in 7/05 and 1/11: normal; Myoview in 11/10 that demonstrated an EF of 51% and slight reversible anterior and septal defect of borderline significance (false + test);  Echo 04/2009:  Mild LVH, EF 55-60%, Gr 1 DD, MAC.    [name] CHF (congestive heart failure) ([place])   . Cholelithiasis   . Chronic back pain   . CIRRHOSIS 10/29/2006  . COLONIC POLYPS 09/13/2007  . COPD 04/22/2009  . DEGENERATIVE JOINT DISEASE 06/15/2007  . DEPRESSION 02/22/2008  . Diverticulosis   . Fatty liver   . Gastritis   . Gastroparesis   . GERD 10/29/2006  . Headache(784.0)    "not everyday now" (11/22/2012)  . HYPERCHOLESTEROLEMIA 02/25/2009  . HYPERTENSION 12/20/2008  . Migraine   . Myocardial infarction ([place])   . Obesity   . OSA (obstructive sleep apnea)    "quit wearing my CPAP" (11/22/2012)  . OSTEOPOROSIS 10/29/2006  . SEIZURE DISORDER    "silent seizures" (11/22/2012)  . Shortness of breath    "off and on" (11/22/2012)  . Stroke [facility]) 04/2004   Jerrol Banana 04/22/2004; "still weaker on the right" (11/22/2012)  . Type II diabetes mellitus ([place])     Objective/Physical Exam General: The patient is alert and oriented x3 in no acute distress.  Dermatology:  Wound #1 noted to the right lateral foot measuring 0.6 x 0.6 x 0.1 cm (LxWxD).   Wound #2 noted to the  left hallux measuring 0.6 x 0.6 x 0.1 cm.  To the noted ulceration(s), there is no eschar. There is a moderate amount of slough, fibrin, and necrotic tissue noted. Granulation tissue and wound base is red. There is a minimal amount of serosanguineous drainage noted. There is no exposed bone muscle-tendon ligament or joint. There is no malodor. Periwound integrity is intact. Skin is warm, dry and supple bilateral lower extremities.  Vascular: Palpable pedal pulses bilaterally. No edema or erythema noted. Capillary refill within normal limits.  Neurological: Epicritic and protective threshold diminished bilaterally.   Musculoskeletal Exam: Range of motion within normal limits to all pedal and ankle joints bilateral. Muscle strength 5/5 in all groups bilateral.   Assessment: #1 ulceration of the right foot secondary to diabetes mellitus #2 diabetes mellitus w/ peripheral neuropathy   Plan of Care:  #1 Patient was evaluated. #2 medically necessary excisional debridement including subcutaneous tissue was performed using a tissue nipper and a chisel blade. Excisional debridement of all the necrotic nonviable tissue down to healthy bleeding viable tissue was performed with post-debridement measurements same as pre-. #3 the wound was cleansed and dry sterile dressing applied. #4 Referral to vascular provided to patient.  #5 Continue using gentamicin cream daily with a dry sterile dressing.  #6 Return to clinic in 3 weeks.    Edrick Kins, DPM [facility]  Dr.  Edrick Kins, DPM    [address]                                        [place], [place] [address]                Office [phone]  Fax [phone]

## 2017-09-20 ENCOUNTER — Telehealth: Payer: Self-pay | Admitting: Podiatry

## 2017-09-20 ENCOUNTER — Other Ambulatory Visit: Payer: Medicare Other

## 2017-09-20 NOTE — Telephone Encounter (Signed)
This is [name], RN with [facility]. I sent her in I think last week about a wound she has on her left great toe. I was just calling to see which physician saw her and just to get an official order for wound care so I can order supplies and put everything in the computer. When you get this, please give me a call back at [phone]. Okay, thank you. Bye bye.

## 2017-09-25 NOTE — Telephone Encounter (Signed)
[name],   Just recommend gentamicin cream which the patient should already have, and dry sterile dressing.  Dry sterile dressing should consist of nonadherent gauze, 4 x 4 gauze, 4 inch Kling, and 4 inch Ace wrap.   Thanks, Dr. Amalia Hailey

## 2017-09-26 NOTE — Telephone Encounter (Signed)
Left message for [name], [place] with Dr. Amalia Hailey 09/25/2017 4:11pm and instructed to perform the dressing changes daily.

## 2017-09-27 ENCOUNTER — Other Ambulatory Visit (INDEPENDENT_AMBULATORY_CARE_PROVIDER_SITE_OTHER): Payer: Medicare Other

## 2017-09-27 ENCOUNTER — Telehealth: Payer: Self-pay | Admitting: Endocrinology

## 2017-09-27 ENCOUNTER — Other Ambulatory Visit: Payer: Self-pay

## 2017-09-27 MED ORDER — AMLODIPINE BESYLATE 5 MG PO TABS: 5.0000 mg | ORAL_TABLET | Freq: Every day | ORAL | 1 refills | Status: DC

## 2017-09-27 NOTE — Telephone Encounter (Signed)
[facility] new prescription for  amLODipine (NORVASC) 5 MG tablet Sent into the pharmacy for the patient.     [facility] - [place], [place]

## 2017-10-05 ENCOUNTER — Ambulatory Visit (INDEPENDENT_AMBULATORY_CARE_PROVIDER_SITE_OTHER): Payer: Medicare Other | Admitting: Podiatry

## 2017-10-05 DIAGNOSIS — E0843 Diabetes mellitus due to underlying condition with diabetic autonomic (poly)neuropathy: Secondary | ICD-10-CM | POA: Diagnosis not present

## 2017-10-05 DIAGNOSIS — L97512 Non-pressure chronic ulcer of other part of right foot with fat layer exposed: Secondary | ICD-10-CM

## 2017-10-05 DIAGNOSIS — I70235 Atherosclerosis of native arteries of right leg with ulceration of other part of foot: Secondary | ICD-10-CM | POA: Diagnosis not present

## 2017-10-05 DIAGNOSIS — L97522 Non-pressure chronic ulcer of other part of left foot with fat layer exposed: Secondary | ICD-10-CM

## 2017-10-05 NOTE — Telephone Encounter (Signed)
Left message on mobile phone to call [facility] [phone] to schedule follow up for the circulation testing.

## 2017-10-05 NOTE — Telephone Encounter (Signed)
-----   Message from Edrick Kins, DPM sent at 10/05/2017 11:24 AM EDT ----- Regarding: vascular consult Please f/u with vascular consult. I have in my last note prior to today that she was to have a vascular consult. Non-urgent.   Thanks, Dr. Amalia Hailey

## 2017-10-05 NOTE — Telephone Encounter (Signed)
Left message informing pt the [facility] was trying to contact her to set up an appt and to call them at [phone].

## 2017-10-11 NOTE — Progress Notes (Signed)
Subjective:  77 year old female with PMHx of T2DM presenting today for follow up evaluation of ulcerations of the right foot and left hallux. She states the wounds are improving. There are no modifying factors and she has been using gentamicin cream as directed. She reports some numbness of the feet bilaterally. Patient is here for further evaluation and treatment.    Past Medical History:  Diagnosis Date  . ALLERGIC RHINITIS 10/29/2006  . Arthritis    "hands, feet, legs, arms" (11/22/2012)  . ASYMPTOMATIC POSTMENOPAUSAL STATUS 02/22/2008  . 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 CHEST PAIN 02/22/2008   LHC in 7/05 and 1/11: normal; Myoview in 11/10 that demonstrated an EF of 51% and slight reversible anterior and septal defect of borderline significance (false + test);  Echo 04/2009:  Mild LVH, EF 55-60%, Gr 1 DD, MAC.    [name] CHF (congestive heart failure) ([place])   . Cholelithiasis   . Chronic back pain   . CIRRHOSIS 10/29/2006  . COLONIC POLYPS 09/13/2007  . COPD 04/22/2009  . DEGENERATIVE JOINT DISEASE 06/15/2007  . DEPRESSION 02/22/2008  . Diverticulosis   . Fatty liver   . Gastritis   . Gastroparesis   . GERD 10/29/2006  . Headache(784.0)    "not everyday now" (11/22/2012)  . HYPERCHOLESTEROLEMIA 02/25/2009  . HYPERTENSION 12/20/2008  . Migraine   . Myocardial infarction ([place])   . Obesity   . OSA (obstructive sleep apnea)    "quit wearing my CPAP" (11/22/2012)  . OSTEOPOROSIS 10/29/2006  . SEIZURE DISORDER    "silent seizures" (11/22/2012)  . Shortness of breath    "off and on" (11/22/2012)  . Stroke [facility]) 04/2004   [place] Banana 04/22/2004; "still weaker on the right" (11/22/2012)  . Type II diabetes mellitus ([place])     Objective/Physical Exam General: The patient is alert and oriented x3 in no acute distress.  Dermatology:  Wound #1 noted to the right hallux measuring 1.0 x 1.0 x 0.1 cm (LxWxD).   Wound #2 noted to the left hallux measuring 1.0 x 1.0 x 0.1 cm.  To the  noted ulceration(s), there is no eschar. There is a moderate amount of slough, fibrin, and necrotic tissue noted. Granulation tissue and wound base is red. There is a minimal amount of serosanguineous drainage noted. There is no exposed bone muscle-tendon ligament or joint. There is no malodor. Periwound integrity is intact. Skin is warm, dry and supple bilateral lower extremities.  Vascular: Palpable pedal pulses bilaterally. No edema or erythema noted. Capillary refill within normal limits.  Neurological: Epicritic and protective threshold diminished bilaterally.   Musculoskeletal Exam: Range of motion within normal limits to all pedal and ankle joints bilateral. Muscle strength 5/5 in all groups bilateral.   Assessment: #1 ulceration of the bilateral great toes secondary to diabetes mellitus #2 diabetes mellitus w/ peripheral neuropathy   Plan of Care:  #1 Patient was evaluated. #2 medically necessary excisional debridement including subcutaneous tissue was performed using a tissue nipper and a chisel blade. Excisional debridement of all the necrotic nonviable tissue down to healthy bleeding viable tissue was performed with post-debridement measurements same as pre-. #3 the wound was cleansed and dry sterile dressing applied. #4 New referral placed for vascular consult.  #5 Continue using gentamicin cream daily with a dry sterile dressing.  #6 Continue weightbearing in post op shoes bilaterally.  #7 Return to clinic in 3 weeks.    Edrick Kins, DPM [facility]  Dr. Dorathy Daft.  Amalia Hailey, [place]                                        [place], [place] [address]                Office [phone]  Fax [phone]

## 2017-10-18 ENCOUNTER — Ambulatory Visit: Payer: Medicare Other | Admitting: Gastroenterology

## 2017-10-26 ENCOUNTER — Telehealth: Payer: Self-pay | Admitting: Podiatry

## 2017-10-26 ENCOUNTER — Ambulatory Visit: Payer: Medicare Other | Admitting: Podiatry

## 2017-10-26 ENCOUNTER — Telehealth: Payer: Self-pay | Admitting: Endocrinology

## 2017-10-26 NOTE — Telephone Encounter (Signed)
Please advise 

## 2017-10-26 NOTE — Telephone Encounter (Signed)
Pt does qualify for the shoes.  What I did not sign was the foot dr's note.  Signing that would be inappropriate.  Foot dr needs to work from noted on epic.

## 2017-10-26 NOTE — Telephone Encounter (Signed)
Called pt and daughter answered phone and I explained that Dr Loanne Drilling is not in agreement with our doctors and therefore we cannot get the diabetic shoes.  Pts daughter stated she was going to there office tomorrow to discuss.

## 2017-10-26 NOTE — Telephone Encounter (Signed)
Daughter Pricilia called re: Dr. Loanne Drilling refused to sign off on patient's diabetic shoes. Patient desperately needs the diabetic shoes. Patient and daughter want to know why Dr. Loanne Drilling would not sign for patient to get her shoes since she needs them. Please call [phone] to advise.

## 2017-10-26 NOTE — Telephone Encounter (Signed)
I called & spoke with [name]'s daughter. I stated that [facility] should be able to review & see Dr. Cordelia Pen notes in [place]. This would give them all needed information that [name] does qualify for shoes. [name]'s daughter stated that she would contact them. If there were any other questions or issues she said she would have them contact our office.

## 2017-10-28 ENCOUNTER — Ambulatory Visit: Payer: Medicare Other | Admitting: Cardiovascular Disease

## 2017-10-28 ENCOUNTER — Encounter: Payer: Self-pay | Admitting: Cardiovascular Disease

## 2017-10-28 VITALS — BP 124/88 | HR 74 | Ht 72.0 in | Wt 347.0 lb

## 2017-10-28 DIAGNOSIS — I998 Other disorder of circulatory system: Secondary | ICD-10-CM | POA: Diagnosis not present

## 2017-10-28 DIAGNOSIS — I70229 Atherosclerosis of native arteries of extremities with rest pain, unspecified extremity: Secondary | ICD-10-CM | POA: Insufficient documentation

## 2017-10-28 DIAGNOSIS — I739 Peripheral vascular disease, unspecified: Secondary | ICD-10-CM | POA: Diagnosis not present

## 2017-10-28 NOTE — Assessment & Plan Note (Signed)
History of essential hypertension her blood pressure measured today at 124/88.  She is on amlodipine losartan and hydrochlorothiazide.  Continue current meds at current dosing.

## 2017-10-28 NOTE — Patient Instructions (Signed)
Medication Instructions:   NO CHANGE  Testing/Procedures:  Your physician has requested that you have an ankle brachial index (ABI). During this test an ultrasound and blood pressure cuff are used to evaluate the arteries that supply the arms and legs with blood. Allow thirty minutes for this exam. There are no restrictions or special instructions.    Follow-Up:  Your physician recommends that you schedule a follow-up appointment in: AS NEEDED PENDING TEST RESULTS

## 2017-10-28 NOTE — Assessment & Plan Note (Signed)
History of continued tobacco use of 1-1/2 packs/day recalcitrant risk factor modification.

## 2017-10-28 NOTE — Assessment & Plan Note (Signed)
Ms. Goswick was referred back to me because of blisters on her feet.  I cannot palpate pedal pulses.  She is a diabetic and has a long smoking history.  I am going to get segmental pressures and ABIs to further evaluate.

## 2017-10-28 NOTE — Assessment & Plan Note (Signed)
History of hyperlipidemia not on statin therapy. 

## 2017-10-28 NOTE — Progress Notes (Signed)
10/28/2017 [name]   08-16-1940  [id number]  Primary Physician Renato Shin, MD Primary Cardiologist: Lorretta Harp MD Garret Reddish, [place], [place]  HPI:  [name] is a 77 y.o.  morbidly overweight widowed African-American female mother of 5 children is accompanied by her daughter [name] today. She was initially referred by her PCP, Dr. Loanne Drilling, for cardiovascular evaluation because of chest pain. Risk factors include 100 pack years of tobacco abuse currently smoking 1-1/2 packs per day, 2 hypertension, diabetes and hyperlipidemia. She's never had a heart attack or stroke. There apparently is documentation of a heart catheterization performed in 2011 that was normal.  Because of her chest pain she underwent stress testing which was normal as was a 2D echocardiogram.  She is referred back because of "sores on her feet" by her podiatrist, Dr. Daylene Katayama.  She is minimally ambulatory and lives a sedentary lifestyle principally from bed to chair.    Current Meds  Medication Sig  . albuterol (PROVENTIL HFA) 108 (90 Base) MCG/ACT inhaler Inhale 2 puffs into the lungs every 4 (four) hours as needed for wheezing or shortness of breath.  [name] albuterol (PROVENTIL) (2.5 MG/3ML) 0.083% nebulizer solution Take 3 mLs (2.5 mg total) by nebulization 3 (three) times daily.  [name] amLODipine (NORVASC) 5 MG tablet Take 1 tablet (5 mg total) by mouth daily.  [name] aspirin EC 81 MG tablet Take 81 mg by mouth daily.  . budesonide-formoterol (SYMBICORT) 160-4.5 MCG/ACT inhaler Inhale 2 puffs into the lungs 2 (two) times daily.  [name] buPROPion (WELLBUTRIN XL) 300 MG 24 hr tablet Take 1 tablet (300 mg total) by mouth every morning.  . diclofenac sodium (VOLTAREN) 1 % GEL Apply 4 g topically 4 (four) times daily.  Mariane Baumgarten Sodium (DSS) 100 MG CAPS Take 100 mg by mouth 2 (two) times daily.  . famotidine (PEPCID) 20 MG tablet Take 1 tablet (20 mg total) by mouth 2 (two) times daily.  [name]  gabapentin (NEURONTIN) 300 MG capsule Take 1 capsule (300 mg total) by mouth 2 (two) times daily.  [name] gentamicin cream (GARAMYCIN) 0.1 % Apply 1 application topically 3 (three) times daily.  [name] glucose blood (ONETOUCH VERIO) test strip 1 each by Other route 2 (two) times daily. And lancets 2/day  . Hyprom-Naphaz-Polysorb-Zn Sulf (CLEAR EYES COMPLETE OP) Place 2 drops into both eyes daily.   . insulin lispro (HUMALOG KWIKPEN) 100 UNIT/ML KiwkPen Inject 0.7 mLs (70 Units total) into the skin daily with supper. And pen needles 3/day  . ipratropium-albuterol (DUONEB) 0.5-2.5 (3) MG/3ML SOLN Take 3 mLs by nebulization every 6 (six) hours as needed.  [name] ketoconazole (NIZORAL) 2 % cream Apply 1 application topically daily.  [name] LANTUS SOLOSTAR 100 UNIT/ML Solostar Pen INJECT 100 UNITS INTO THE SKIN EVERY MORNING  . lidocaine (LIDODERM) 5 % Place 1 patch onto the skin daily. Remove & Discard patch within 12 hours or as directed by MD  . losartan-hydrochlorothiazide (HYZAAR) 50-12.5 MG tablet Take 1 tablet by mouth daily.  . nicotine (NICODERM CQ) 21 mg/24hr patch Place 1 patch (21 mg total) onto the skin daily.  [name] omeprazole (PRILOSEC) 40 MG capsule TAKE 1 CAPSULE(40 MG) BY MOUTH DAILY  . ondansetron (ZOFRAN) 4 MG tablet TAKE 1 TABLET(4 MG) BY MOUTH EVERY 8 HOURS AS NEEDED FOR NAUSEA OR VOMITING  . polyethylene glycol (MIRALAX / GLYCOLAX) packet Take 17 g by mouth 2 (two) times daily.  [name] senna-docusate (SENOKOT-S) 8.6-50 MG tablet Take 2 tablets  by mouth 2 (two) times daily.  . traMADol (ULTRAM) 50 MG tablet Take 1 tablet (50 mg total) by mouth every 6 (six) hours as needed.  . traMADol (ULTRAM) 50 MG tablet Take 1 tablet (50 mg total) by mouth every 6 (six) hours as needed.  . triamcinolone ointment (KENALOG) 0.5 % Apply 1 application topically 4 (four) times daily. As needed for itching     Allergies  Allergen Reactions  . Hydrocodone Hives  . Lisinopril     REACTION: Cough  . Pioglitazone      REACTION: Edema  . Varenicline Tartrate     REACTION: bad dreams    Social History   Socioeconomic History  . Marital status: Single    Spouse name: Not on file  . Number of children: 5  . Years of education: Not on file  . Highest education level: Not on file  Occupational History  . Occupation: Retired  Scientific laboratory technician  . Financial resource strain: Not on file  . Food insecurity:    Worry: Not on file    Inability: Not on file  . Transportation needs:    Medical: Not on file    Non-medical: Not on file  Tobacco Use  . Smoking status: Current Every Day Smoker    Packs/day: 0.50    Years: 60.00    Pack years: 30.00    Types: Cigarettes  . Smokeless tobacco: Never Used  Substance and Sexual Activity  . Alcohol use: No    Alcohol/week: 0.0 oz  . Drug use: No  . Sexual activity: Not Currently  Lifestyle  . Physical activity:    Days per week: Not on file    Minutes per session: Not on file  . Stress: Not on file  Relationships  . Social connections:    Talks on phone: Not on file    Gets together: Not on file    Attends religious service: Not on file    Active member of club or organization: Not on file    Attends meetings of clubs or organizations: Not on file    Relationship status: Not on file  . Intimate partner violence:    Fear of current or ex partner: Not on file    Emotionally abused: Not on file    Physically abused: Not on file    Forced sexual activity: Not on file  Other Topics Concern  . Not on file  Social History Narrative   ** Merged History Encounter **         Review of Systems: General: negative for chills, fever, night sweats or weight changes.  Cardiovascular: negative for chest pain, dyspnea on exertion, edema, orthopnea, palpitations, paroxysmal nocturnal dyspnea or shortness of breath Dermatological: negative for rash Respiratory: negative for cough or wheezing Urologic: negative for hematuria Abdominal: negative for nausea,  vomiting, diarrhea, bright red blood per rectum, melena, or hematemesis Neurologic: negative for visual changes, syncope, or dizziness All other systems reviewed and are otherwise negative except as noted above.    Blood pressure 124/88, pulse 74, height 6' (1.829 m), weight (!) 347 lb (157.4 kg).  General appearance: alert and no distress Neck: no adenopathy, no carotid bruit, no JVD, supple, symmetrical, trachea midline and thyroid not enlarged, symmetric, no tenderness/mass/nodules Lungs: clear to auscultation bilaterally Heart: regular rate and rhythm, S1, S2 normal, no murmur, click, rub or gallop Extremities: 1+ pedal pulses bilaterally Pulses: 1+ pedal pulses bilaterally Skin: Skin color, texture, turgor normal. No rashes or lesions  Neurologic: Alert and oriented X 3, normal strength and tone. Normal symmetric reflexes. Normal coordination and gait  EKG not performed today  ASSESSMENT AND PLAN:   HYPERCHOLESTEROLEMIA History of hyperlipidemia not on statin therapy.  SMOKER History of continued tobacco use of 1-1/2 packs/day recalcitrant risk factor modification.  HYPERTENSION History of essential hypertension her blood pressure measured today at 124/88.  She is on amlodipine losartan and hydrochlorothiazide.  Continue current meds at current dosing.  Critical lower limb ischemia Ms. Amberg was referred back to me because of blisters on her feet.  I cannot palpate pedal pulses.  She is a diabetic and has a long smoking history.  I am going to get segmental pressures and ABIs to further evaluate.      Lorretta Harp MD FACP,FACC,FAHA, [facility] 10/28/2017 4:36 PM

## 2017-11-02 ENCOUNTER — Other Ambulatory Visit: Payer: Self-pay | Admitting: Podiatry

## 2017-11-02 DIAGNOSIS — L97522 Non-pressure chronic ulcer of other part of left foot with fat layer exposed: Secondary | ICD-10-CM

## 2017-11-02 DIAGNOSIS — I70235 Atherosclerosis of native arteries of right leg with ulceration of other part of foot: Secondary | ICD-10-CM

## 2017-11-02 DIAGNOSIS — L97512 Non-pressure chronic ulcer of other part of right foot with fat layer exposed: Secondary | ICD-10-CM

## 2017-11-08 ENCOUNTER — Ambulatory Visit: Payer: Medicare Other | Admitting: Gastroenterology

## 2017-11-09 ENCOUNTER — Other Ambulatory Visit: Payer: Self-pay | Admitting: Cardiovascular Disease

## 2017-11-09 ENCOUNTER — Ambulatory Visit (HOSPITAL_COMMUNITY)
Admission: RE | Admit: 2017-11-09 | Payer: Medicare Other | Source: Ambulatory Visit | Attending: Cardiovascular Disease | Admitting: Cardiovascular Disease

## 2017-11-09 DIAGNOSIS — L97512 Non-pressure chronic ulcer of other part of right foot with fat layer exposed: Secondary | ICD-10-CM

## 2017-11-09 DIAGNOSIS — E0843 Diabetes mellitus due to underlying condition with diabetic autonomic (poly)neuropathy: Secondary | ICD-10-CM

## 2017-11-09 DIAGNOSIS — I70235 Atherosclerosis of native arteries of right leg with ulceration of other part of foot: Secondary | ICD-10-CM

## 2017-11-09 DIAGNOSIS — L97522 Non-pressure chronic ulcer of other part of left foot with fat layer exposed: Secondary | ICD-10-CM

## 2017-11-23 ENCOUNTER — Telehealth: Payer: Self-pay | Admitting: Endocrinology

## 2017-11-23 ENCOUNTER — Other Ambulatory Visit: Payer: Self-pay

## 2017-11-23 MED ORDER — INSULIN GLARGINE 100 UNIT/ML SOLOSTAR PEN: PEN_INJECTOR | SUBCUTANEOUS | 0 refills | Status: DC

## 2017-11-23 NOTE — Telephone Encounter (Signed)
I have sent to requested pharmacy.  

## 2017-11-23 NOTE — Telephone Encounter (Signed)
Patient needs new RX for Lantus sent to [facility] on [facility]

## 2017-11-25 ENCOUNTER — Other Ambulatory Visit: Payer: Self-pay

## 2017-11-25 MED ORDER — INSULIN GLARGINE 100 UNIT/ML SOLOSTAR PEN: PEN_INJECTOR | SUBCUTANEOUS | 0 refills | Status: DC

## 2017-11-29 ENCOUNTER — Emergency Department (HOSPITAL_COMMUNITY): Payer: Medicare Other

## 2017-11-29 ENCOUNTER — Encounter (HOSPITAL_COMMUNITY): Payer: Self-pay

## 2017-11-29 ENCOUNTER — Emergency Department (HOSPITAL_COMMUNITY)
Admission: EM | Admit: 2017-11-29 | Discharge: 2017-11-29 | Disposition: A | Discharge status: Home / Self Care | Payer: Medicare Other | Attending: Emergency Medicine | Admitting: Emergency Medicine

## 2017-11-29 DIAGNOSIS — Z79899 Other long term (current) drug therapy: Secondary | ICD-10-CM | POA: Insufficient documentation

## 2017-11-29 DIAGNOSIS — I951 Orthostatic hypotension: Secondary | ICD-10-CM | POA: Diagnosis not present

## 2017-11-29 DIAGNOSIS — I110 Hypertensive heart disease with heart failure: Secondary | ICD-10-CM | POA: Insufficient documentation

## 2017-11-29 DIAGNOSIS — Y9389 Activity, other specified: Secondary | ICD-10-CM | POA: Diagnosis not present

## 2017-11-29 DIAGNOSIS — S3992XA Unspecified injury of lower back, initial encounter: Secondary | ICD-10-CM | POA: Diagnosis present

## 2017-11-29 DIAGNOSIS — M25561 Pain in right knee: Secondary | ICD-10-CM | POA: Diagnosis not present

## 2017-11-29 DIAGNOSIS — I5032 Chronic diastolic (congestive) heart failure: Secondary | ICD-10-CM | POA: Diagnosis not present

## 2017-11-29 DIAGNOSIS — Y929 Unspecified place or not applicable: Secondary | ICD-10-CM | POA: Diagnosis not present

## 2017-11-29 DIAGNOSIS — Y999 Unspecified external cause status: Secondary | ICD-10-CM | POA: Insufficient documentation

## 2017-11-29 DIAGNOSIS — W010XXA Fall on same level from slipping, tripping and stumbling without subsequent striking against object, initial encounter: Secondary | ICD-10-CM | POA: Insufficient documentation

## 2017-11-29 DIAGNOSIS — S39012A Strain of muscle, fascia and tendon of lower back, initial encounter: Secondary | ICD-10-CM | POA: Diagnosis not present

## 2017-11-29 DIAGNOSIS — Z794 Long term (current) use of insulin: Secondary | ICD-10-CM | POA: Diagnosis not present

## 2017-11-29 DIAGNOSIS — E119 Type 2 diabetes mellitus without complications: Secondary | ICD-10-CM | POA: Insufficient documentation

## 2017-11-29 DIAGNOSIS — Z7982 Long term (current) use of aspirin: Secondary | ICD-10-CM | POA: Diagnosis not present

## 2017-11-29 DIAGNOSIS — M25551 Pain in right hip: Secondary | ICD-10-CM | POA: Diagnosis not present

## 2017-11-29 DIAGNOSIS — W19XXXA Unspecified fall, initial encounter: Secondary | ICD-10-CM

## 2017-11-29 LAB — CBC
HCT: 41.2 % (ref 36.0–46.0)
Hemoglobin: 13.4 g/dL (ref 12.0–15.0)
MCH: 32.4 pg (ref 26.0–34.0)
MCHC: 32.5 g/dL (ref 30.0–36.0)
MCV: 99.5 fL (ref 78.0–100.0)
PLATELETS: 212 10*3/uL (ref 150–400)
RBC: 4.14 MIL/uL (ref 3.87–5.11)
RDW: 12.9 % (ref 11.5–15.5)
WBC: 6.7 10*3/uL (ref 4.0–10.5)

## 2017-11-29 LAB — BASIC METABOLIC PANEL
ANION GAP: 7 (ref 5–15)
BUN: 14 mg/dL (ref 8–23)
CALCIUM: 8.4 mg/dL — AB (ref 8.9–10.3)
CO2: 30 mmol/L (ref 22–32)
Chloride: 102 mmol/L (ref 98–111)
Creatinine, Ser: 1.19 mg/dL — ABNORMAL HIGH (ref 0.44–1.00)
GFR, EST AFRICAN AMERICAN: 50 mL/min — AB (ref >60)
GFR, EST NON AFRICAN AMERICAN: 43 mL/min — AB (ref >60)
GLUCOSE: 334 mg/dL — AB (ref 70–99)
Potassium: 4.3 mmol/L (ref 3.5–5.1)
SODIUM: 139 mmol/L (ref 135–145)

## 2017-11-29 MED ORDER — SODIUM CHLORIDE 0.9 % IV BOLUS
500.0000 mL | Freq: Once | INTRAVENOUS | Status: AC
  Administered 2017-11-29: 500 mL via INTRAVENOUS

## 2017-11-29 MED ORDER — ACETAMINOPHEN 500 MG PO TABS
1000.0000 mg | ORAL_TABLET | Freq: Once | ORAL | Status: AC
  Administered 2017-11-29: 1000 mg via ORAL
  Filled 2017-11-29: qty 2

## 2017-11-29 NOTE — ED Notes (Signed)
Bed: WA20 Expected date:  Expected time:  Means of arrival:  Comments: EMS-fall 
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
## 2017-11-29 NOTE — ED Triage Notes (Signed)
Pt arrived from home due a fall tonight, states she was on the floor for roughly an hour before she could get to the phone for help. Denies loc or hitting head but complaining of new right sided leg pain.

## 2017-11-29 NOTE — ED Notes (Signed)
Priscilla Johnson-[phone].  Please call when pt is ready for Dx

## 2018-01-23 ENCOUNTER — Other Ambulatory Visit: Payer: Self-pay | Admitting: Endocrinology

## 2018-01-24 ENCOUNTER — Other Ambulatory Visit: Payer: Self-pay | Admitting: Endocrinology

## 2018-03-23 LAB — GRAM STAIN: Gram Stain: NONE SEEN

## 2018-03-31 ENCOUNTER — Ambulatory Visit: Payer: Medicare Other | Admitting: Endocrinology

## 2018-04-25 ENCOUNTER — Ambulatory Visit (INDEPENDENT_AMBULATORY_CARE_PROVIDER_SITE_OTHER): Payer: Medicare Other | Admitting: Endocrinology

## 2018-04-25 ENCOUNTER — Ambulatory Visit: Payer: Medicare Other | Admitting: Endocrinology

## 2018-04-25 ENCOUNTER — Encounter: Payer: Self-pay | Admitting: Endocrinology

## 2018-04-25 VITALS — BP 118/80 | HR 73 | Ht 72.0 in | Wt 348.0 lb

## 2018-04-25 DIAGNOSIS — N183 Chronic kidney disease, stage 3 unspecified: Secondary | ICD-10-CM

## 2018-04-25 DIAGNOSIS — E1122 Type 2 diabetes mellitus with diabetic chronic kidney disease: Secondary | ICD-10-CM | POA: Diagnosis not present

## 2018-04-25 DIAGNOSIS — Z794 Long term (current) use of insulin: Secondary | ICD-10-CM | POA: Diagnosis not present

## 2018-04-25 LAB — POCT GLYCOSYLATED HEMOGLOBIN (HGB A1C): HEMOGLOBIN A1C: 10.1 % — AB (ref 4.0–5.6)

## 2018-04-25 MED ORDER — INSULIN GLARGINE (1 UNIT DIAL) 300 UNIT/ML ~~LOC~~ SOPN: 120.0000 [IU] | PEN_INJECTOR | SUBCUTANEOUS | 11 refills | Status: DC

## 2018-04-25 MED ORDER — INSULIN LISPRO 200 UNIT/ML ~~LOC~~ SOPN: 70.0000 [IU] | PEN_INJECTOR | Freq: Every day | SUBCUTANEOUS | 11 refills | Status: DC
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
## 2018-04-25 NOTE — Patient Instructions (Addendum)
I have sent 2 prescriptions to your pharmacy: to increase the Lantus to 120 units each morning (changing to the concentrated, which is called "Toujeo"), and to increase the Humalog to 70 units with supper (also changing to the more concentrated). check your blood sugar twice a day.  vary the time of day when you check, between before the 3 meals, and at bedtime.  also check if you have symptoms of your blood sugar being too high or too low.  please keep a record of the readings and bring it to your next appointment here (or you can bring the meter itself).  You can write it on any piece of paper.  please call us sooner if your blood sugar goes below 70, or if you have a lot of readings over 200.   Please come back for a follow-up appointment in 2 months.

## 2018-05-09 ENCOUNTER — Other Ambulatory Visit: Payer: Self-pay | Admitting: Endocrinology

## 2018-05-09 NOTE — Telephone Encounter (Signed)
Please advise if refill is appropriate 

## 2018-05-09 NOTE — Telephone Encounter (Signed)
Please refill x 3 months Further refills would have to be considered by new PCP   

## 2018-05-12 ENCOUNTER — Ambulatory Visit: Payer: Medicare Other | Admitting: Internal Medicine

## 2018-05-23 ENCOUNTER — Telehealth: Payer: Self-pay

## 2018-05-23 NOTE — Telephone Encounter (Signed)
Author phoned pt. To assess interest in having subsequent AWV prior to new patient appointment with Dr. Jerilee Hoh. Pt. reportedly asleep, spoke with daughter Bary Leriche, on [place]. Author explained there is a 3PM opening with nurse 2/5 for AWV and to let pt. schedule if interested. Reminder given for appointment with Dr. Jerilee Hoh. Daughter stated she would relay.

## 2018-05-24 ENCOUNTER — Ambulatory Visit: Payer: Medicare Other | Admitting: Internal Medicine

## 2018-05-24 DIAGNOSIS — Z0289 Encounter for other administrative examinations: Secondary | ICD-10-CM

## 2018-05-30 ENCOUNTER — Ambulatory Visit: Payer: Medicare Other | Admitting: Internal Medicine

## 2018-05-30 DIAGNOSIS — Z0289 Encounter for other administrative examinations: Secondary | ICD-10-CM

## 2018-06-10 ENCOUNTER — Emergency Department (HOSPITAL_COMMUNITY): Payer: Medicare Other

## 2018-06-10 ENCOUNTER — Other Ambulatory Visit: Payer: Self-pay

## 2018-06-10 ENCOUNTER — Emergency Department (HOSPITAL_COMMUNITY)
Admission: EM | Admit: 2018-06-10 | Discharge: 2018-06-10 | Disposition: A | Discharge status: Home / Self Care | Payer: Medicare Other | Attending: Emergency Medicine | Admitting: Emergency Medicine

## 2018-06-10 ENCOUNTER — Encounter (HOSPITAL_COMMUNITY): Payer: Self-pay

## 2018-06-10 DIAGNOSIS — N12 Tubulo-interstitial nephritis, not specified as acute or chronic: Secondary | ICD-10-CM | POA: Diagnosis not present

## 2018-06-10 DIAGNOSIS — Z7982 Long term (current) use of aspirin: Secondary | ICD-10-CM | POA: Insufficient documentation

## 2018-06-10 DIAGNOSIS — F17210 Nicotine dependence, cigarettes, uncomplicated: Secondary | ICD-10-CM | POA: Insufficient documentation

## 2018-06-10 DIAGNOSIS — E119 Type 2 diabetes mellitus without complications: Secondary | ICD-10-CM | POA: Insufficient documentation

## 2018-06-10 DIAGNOSIS — Z79899 Other long term (current) drug therapy: Secondary | ICD-10-CM | POA: Diagnosis not present

## 2018-06-10 DIAGNOSIS — I5032 Chronic diastolic (congestive) heart failure: Secondary | ICD-10-CM | POA: Insufficient documentation

## 2018-06-10 DIAGNOSIS — Z794 Long term (current) use of insulin: Secondary | ICD-10-CM | POA: Insufficient documentation

## 2018-06-10 DIAGNOSIS — J449 Chronic obstructive pulmonary disease, unspecified: Secondary | ICD-10-CM | POA: Diagnosis not present

## 2018-06-10 DIAGNOSIS — I110 Hypertensive heart disease with heart failure: Secondary | ICD-10-CM | POA: Diagnosis not present

## 2018-06-10 DIAGNOSIS — R1032 Left lower quadrant pain: Secondary | ICD-10-CM | POA: Diagnosis present

## 2018-06-10 LAB — URINALYSIS, ROUTINE W REFLEX MICROSCOPIC
Bilirubin Urine: NEGATIVE
Glucose, UA: >=500 mg/dL — AB
Ketones, ur: NEGATIVE mg/dL
Nitrite: NEGATIVE
PROTEIN: 100 mg/dL — AB
Specific Gravity, Urine: 1.018 (ref 1.005–1.030)
WBC, UA: >50 WBC/hpf — ABNORMAL HIGH (ref 0–5)
pH: 5 (ref 5.0–8.0)

## 2018-06-10 LAB — POCT I-STAT EG7
ACID-BASE EXCESS: 6 mmol/L — AB (ref 0.0–2.0)
BICARBONATE: 34.4 mmol/L — AB (ref 20.0–28.0)
CALCIUM ION: 1.16 mmol/L (ref 1.15–1.40)
HEMATOCRIT: 44 % (ref 36.0–46.0)
Hemoglobin: 15 g/dL (ref 12.0–15.0)
O2 Saturation: 32 %
PO2 VEN: 22 mmHg — AB (ref 32.0–45.0)
Patient temperature: 98
Potassium: 4.3 mmol/L (ref 3.5–5.1)
SODIUM: 133 mmol/L — AB (ref 135–145)
TCO2: 36 mmol/L — AB (ref 22–32)
pCO2, Ven: 63.7 mmHg — ABNORMAL HIGH (ref 44.0–60.0)
pH, Ven: 7.339 (ref 7.250–7.430)

## 2018-06-10 LAB — CBC WITH DIFFERENTIAL/PLATELET
Abs Immature Granulocytes: 0.02 10*3/uL (ref 0.00–0.07)
Basophils Absolute: 0 10*3/uL (ref 0.0–0.1)
Basophils Relative: 0 %
EOS ABS: 0 10*3/uL (ref 0.0–0.5)
EOS PCT: 0 %
HEMATOCRIT: 45.9 % (ref 36.0–46.0)
Hemoglobin: 14.3 g/dL (ref 12.0–15.0)
IMMATURE GRANULOCYTES: 0 %
LYMPHS ABS: 1.5 10*3/uL (ref 0.7–4.0)
Lymphocytes Relative: 17 %
MCH: 30.6 pg (ref 26.0–34.0)
MCHC: 31.2 g/dL (ref 30.0–36.0)
MCV: 98.1 fL (ref 80.0–100.0)
MONO ABS: 0.6 10*3/uL (ref 0.1–1.0)
MONOS PCT: 7 %
NEUTROS PCT: 76 %
Neutro Abs: 6.7 10*3/uL (ref 1.7–7.7)
Platelets: 186 10*3/uL (ref 150–400)
RBC: 4.68 MIL/uL (ref 3.87–5.11)
RDW: 12.6 % (ref 11.5–15.5)
WBC: 8.8 10*3/uL (ref 4.0–10.5)
nRBC: 0 % (ref 0.0–0.2)

## 2018-06-10 LAB — COMPREHENSIVE METABOLIC PANEL
ALK PHOS: 87 U/L (ref 38–126)
ALT: 11 U/L (ref 0–44)
ANION GAP: 11 (ref 5–15)
AST: 18 U/L (ref 15–41)
Albumin: 3 g/dL — ABNORMAL LOW (ref 3.5–5.0)
BILIRUBIN TOTAL: 0.9 mg/dL (ref 0.3–1.2)
BUN: 18 mg/dL (ref 8–23)
CALCIUM: 8.4 mg/dL — AB (ref 8.9–10.3)
CO2: 28 mmol/L (ref 22–32)
Chloride: 94 mmol/L — ABNORMAL LOW (ref 98–111)
Creatinine, Ser: 1.3 mg/dL — ABNORMAL HIGH (ref 0.44–1.00)
GFR calc non Af Amer: 40 mL/min — ABNORMAL LOW (ref >60)
GFR, EST AFRICAN AMERICAN: 46 mL/min — AB (ref >60)
Glucose, Bld: 331 mg/dL — ABNORMAL HIGH (ref 70–99)
Potassium: 4.6 mmol/L (ref 3.5–5.1)
Sodium: 133 mmol/L — ABNORMAL LOW (ref 135–145)
TOTAL PROTEIN: 6.8 g/dL (ref 6.5–8.1)

## 2018-06-10 LAB — CBG MONITORING, ED: GLUCOSE-CAPILLARY: 334 mg/dL — AB (ref 70–99)

## 2018-06-10 LAB — LIPASE, BLOOD: LIPASE: 21 U/L (ref 11–51)

## 2018-06-10 LAB — I-STAT TROPONIN, ED: TROPONIN I, POC: 0 ng/mL (ref 0.00–0.08)

## 2018-06-10 MED ORDER — CYCLOBENZAPRINE HCL 10 MG PO TABS: 5.0000 mg | ORAL_TABLET | Freq: Once | ORAL | Status: DC

## 2018-06-10 MED ORDER — ONDANSETRON HCL 4 MG/2ML IJ SOLN
4.0000 mg | Freq: Once | INTRAMUSCULAR | Status: AC
  Administered 2018-06-10: 4 mg via INTRAVENOUS
  Filled 2018-06-10: qty 2

## 2018-06-10 MED ORDER — METOCLOPRAMIDE HCL 5 MG/ML IJ SOLN
10.0000 mg | Freq: Once | INTRAMUSCULAR | Status: AC
  Administered 2018-06-10: 10 mg via INTRAVENOUS
  Filled 2018-06-10: qty 2

## 2018-06-10 MED ORDER — FENTANYL CITRATE (PF) 100 MCG/2ML IJ SOLN
25.0000 ug | Freq: Once | INTRAMUSCULAR | Status: AC
  Administered 2018-06-10: 25 ug via INTRAVENOUS
  Filled 2018-06-10: qty 2

## 2018-06-10 MED ORDER — SODIUM CHLORIDE 0.9 % IV SOLN
1.0000 g | Freq: Once | INTRAVENOUS | Status: AC
  Administered 2018-06-10: 1 g via INTRAVENOUS
  Filled 2018-06-10: qty 10

## 2018-06-10 MED ORDER — CEPHALEXIN 500 MG PO CAPS: 500.0000 mg | ORAL_CAPSULE | Freq: Four times a day (QID) | ORAL | 0 refills | Status: AC

## 2018-06-10 MED ORDER — TRAMADOL HCL 50 MG PO TABS: 50.0000 mg | ORAL_TABLET | Freq: Once | ORAL | Status: DC

## 2018-06-10 MED ORDER — SODIUM CHLORIDE 0.9 % IV BOLUS
1000.0000 mL | Freq: Once | INTRAVENOUS | Status: AC
  Administered 2018-06-10: 1000 mL via INTRAVENOUS

## 2018-06-10 MED ORDER — DIPHENHYDRAMINE HCL 50 MG/ML IJ SOLN
12.5000 mg | Freq: Once | INTRAMUSCULAR | Status: AC
  Administered 2018-06-10: 12.5 mg via INTRAVENOUS
  Filled 2018-06-10: qty 1

## 2018-06-10 NOTE — ED Notes (Signed)
Pt attempting to use [facility]
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
## 2018-06-10 NOTE — Discharge Instructions (Signed)
Take antibiotic keflex 4 times a day for a total of 7 days even if you feel better sooner.  Stay well hydrated with water.

## 2018-06-10 NOTE — ED Notes (Signed)
Dr Darl Householder informed of I Stat 7 results

## 2018-06-10 NOTE — ED Triage Notes (Signed)
Pt from home with ems for gen weakness, n.v for the past week with LLQ and lower back pain. Pt unable to take home oral meds today due to the vomiting. Pt reports vomiting multiple times today. He of CHF, COPD, Diabetes. CBG 311 today but has been reading over 600 this week.   BP 160/97 P88  Pt a.o, tearful during triage.

## 2018-06-11 IMAGING — CT CT ABD-PELV W/ CM
2 of 5 series · 14 of 46 positions shown, 16 images · IV contrast (APPLIED)
Comparison: Chest radiograph from earlier today. 06/29/2012 chest
CT angiogram. 07/06/2017 CT abdomen/pelvis.

CLINICAL DATA: Chest wall pain.  Hypertension.  Nausea.  Vomiting.

EXAM:
CT ANGIOGRAPHY CHEST
CT ABDOMEN AND PELVIS WITH CONTRAST
TECHNIQUE: Multidetector CT imaging of the chest was performed using the
standard protocol during bolus administration of intravenous
contrast. Multiplanar CT image reconstructions and MIPs were
obtained to evaluate the vascular anatomy. Multidetector CT imaging
of the abdomen and pelvis was performed using the standard protocol
during bolus administration of intravenous contrast.
CONTRAST:  100mL RIHFYA-ZD2 IOPAMIDOL (RIHFYA-ZD2) INJECTION 76%

[Series 6: abdomen 5.0 · axial · 0.93mm/px · z∈[+882,+1332]mm · 11 of 106 slices shown, 13 images]
[im 8/106  soft-tissue]
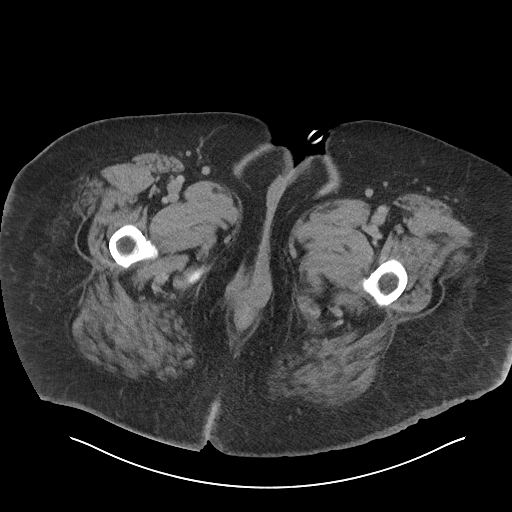
[im 8/106  bone]
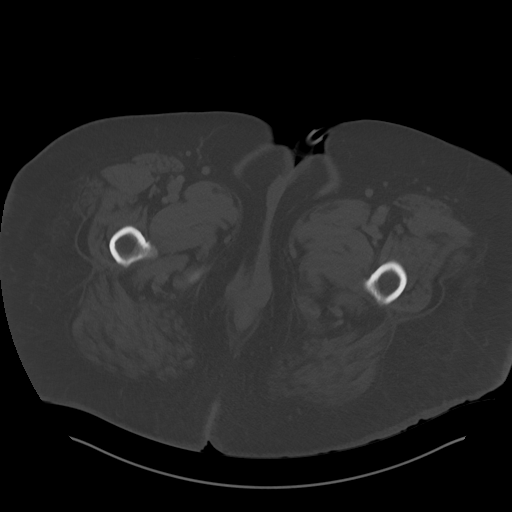
[im 16/106  soft-tissue]
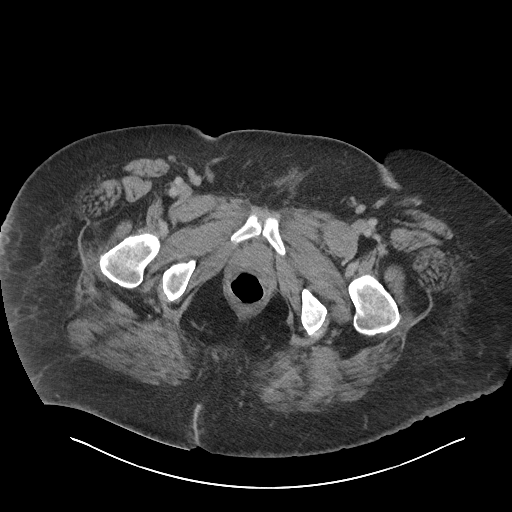
[im 23/106  soft-tissue]
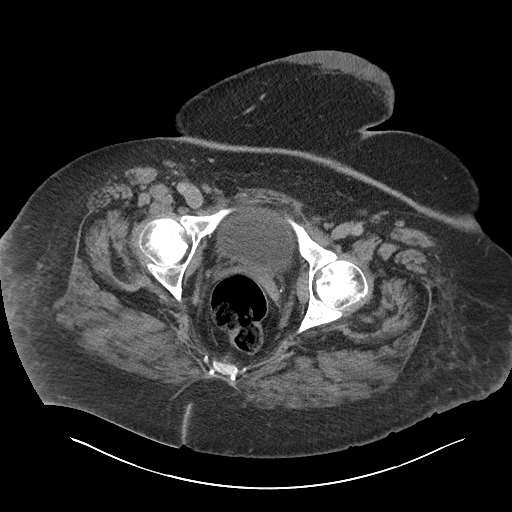
[im 38/106  soft-tissue]
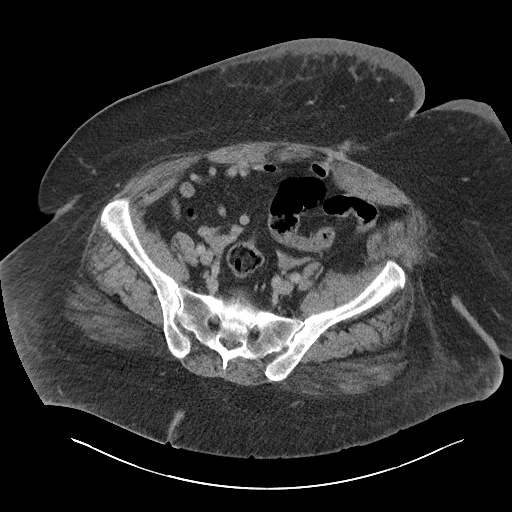
[im 46/106  soft-tissue]
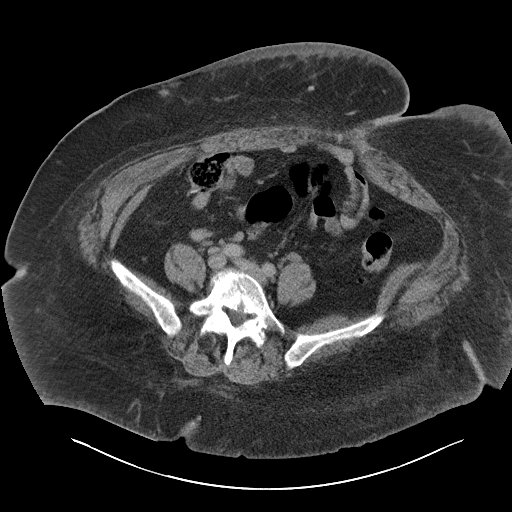
[im 53/106  soft-tissue]
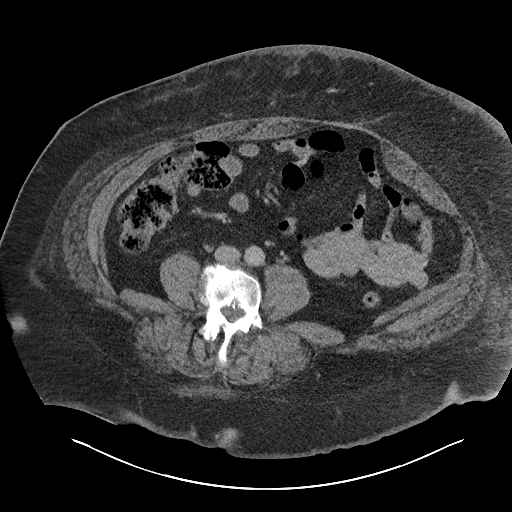
[im 61/106  soft-tissue]
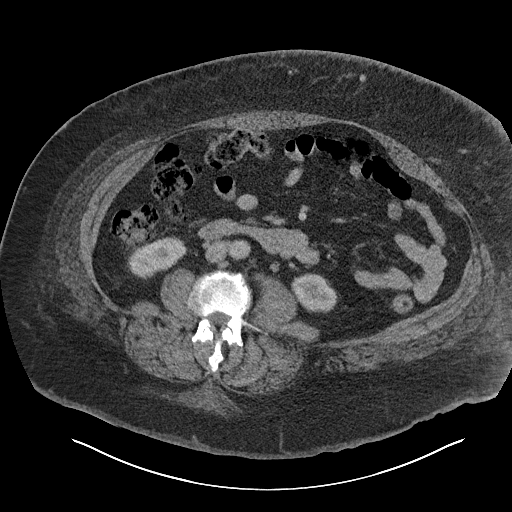
[im 68/106  soft-tissue]
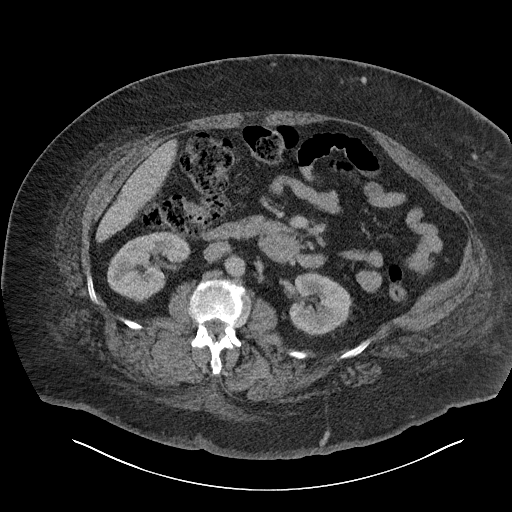
[im 83/106  soft-tissue]
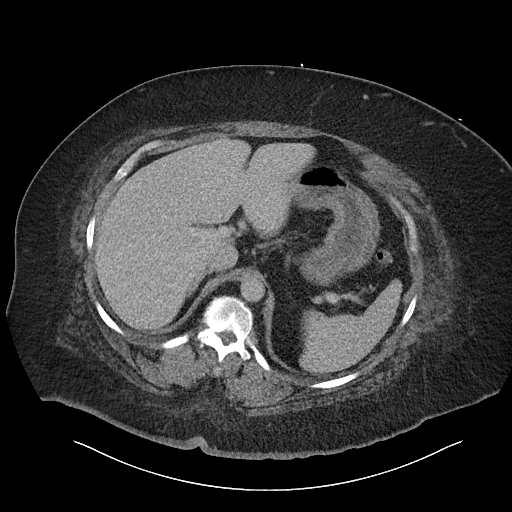
[im 83/106  bone]
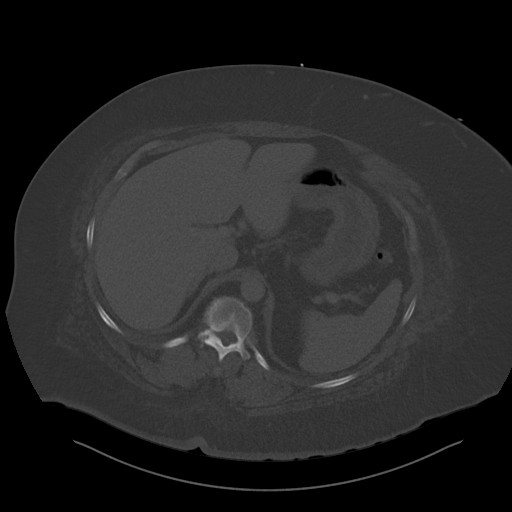
[im 91/106  soft-tissue]
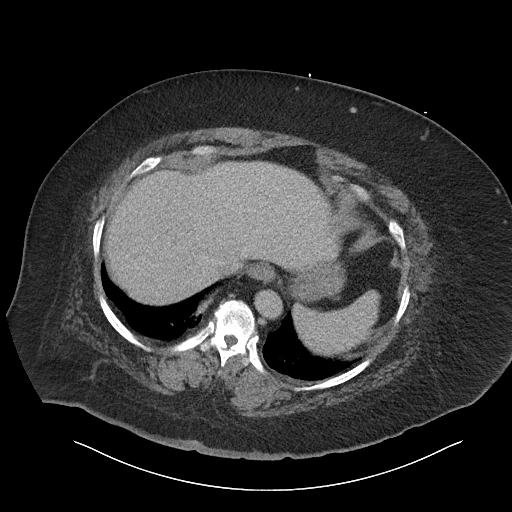
[im 98/106  soft-tissue]
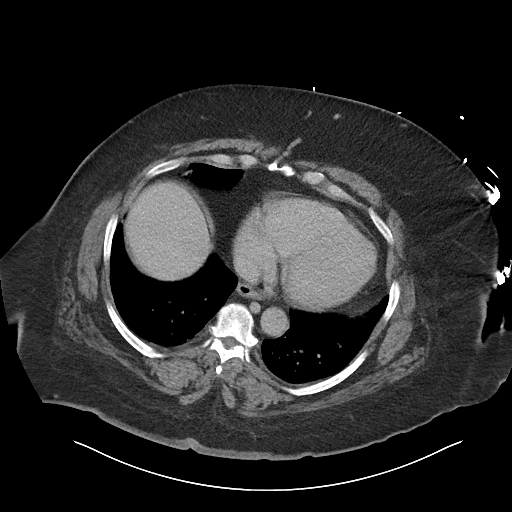

[Series 9: abdomen 3.0 mpr cor · coronal · 1.03mm/px · 3 of 135 slices shown]
[im 45/135  soft-tissue]
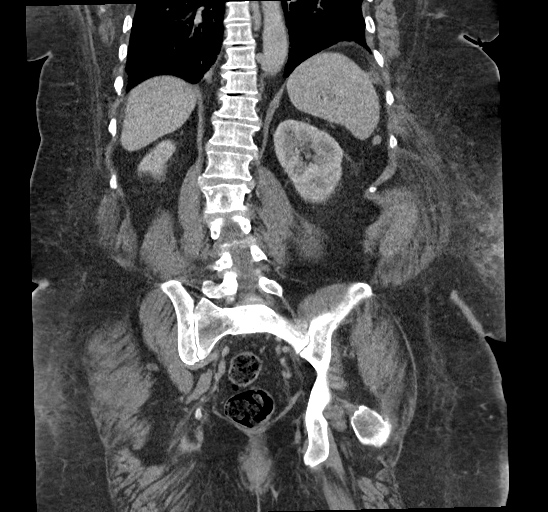
[im 60/135  soft-tissue]
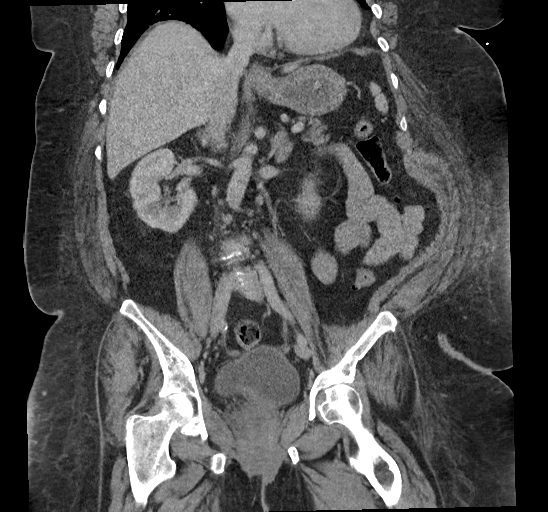
[im 75/135  soft-tissue]
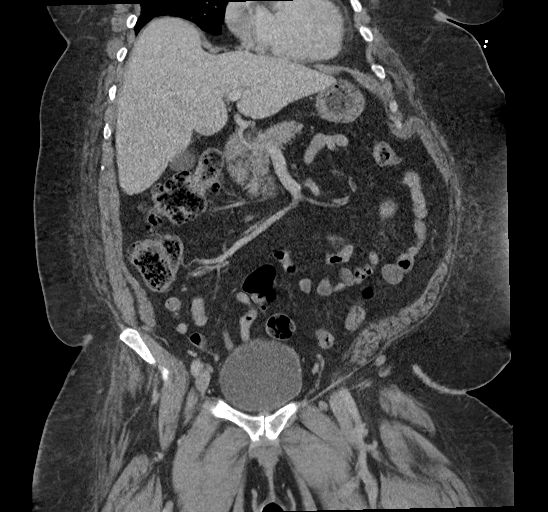

[14 of 46 positions shown; findings below may reference images not displayed]

FINDINGS: CTA CHEST FINDINGS

Cardiovascular: The study is high quality for the evaluation of
pulmonary embolism. There are no filling defects in the central,
lobar, segmental or subsegmental pulmonary artery branches to
suggest acute pulmonary embolism. Atherosclerotic nonaneurysmal
thoracic aorta. Normal caliber pulmonary arteries. Mild
cardiomegaly. No significant pericardial fluid/thickening.

Mediastinum/Nodes: No discrete thyroid nodules. Unremarkable
esophagus. No axillary adenopathy. Mild right paratracheal
adenopathy up to 1.1 cm (series 3/image 49), stable since 06/29/2012
chest CT. Stable mildly enlarged 1.5 cm subcarinal node (series
3/image 65). No new mediastinal lymphadenopathy. Stable mildly
enlarged 1.3 cm right hilar node (series 3/image 63). No new hilar
adenopathy.

Lungs/Pleura: No pneumothorax. No pleural effusion. Sub solid 1.4 cm
posterior right lower lobe pulmonary nodule (series 4/image 77) with
solid 7 mm component, mildly increased from 1.0 cm on 06/29/2012
chest CT. Patchy bandlike consolidation and ground-glass attenuation
throughout the left upper lobe. Hypoventilatory changes in the
dependent lungs. No additional significant pulmonary nodules in the
aerated portions of the lungs.

Musculoskeletal: No aggressive appearing focal osseous lesions.
Moderate thoracic spondylosis.

Review of the MIP images confirms the above findings.

CT ABDOMEN and PELVIS FINDINGS

Hepatobiliary: Normal liver with no liver mass. Cholelithiasis. No
gallbladder distention. No gallbladder wall thickening. No
pericholecystic fluid. No biliary ductal dilatation.

Pancreas: Heterogeneous fatty infiltration of the pancreas without
discrete pancreatic mass. No pancreatic duct dilation.

Spleen: Normal size. No mass.

Adrenals/Urinary Tract: Normal right adrenal. Left adrenal 1.6 cm
nodule with density 42 HU (series 6/image 29), stable since
01/29/2016 CT, most compatible with a benign adenoma. Normal kidneys
with no hydronephrosis and no renal mass. Normal bladder.

Stomach/Bowel: Normal non-distended stomach. Normal caliber small
bowel with no small bowel wall thickening. Appendectomy. Scattered
mild colonic diverticulosis, with no large bowel wall thickening or
pericolonic fat stranding.

Vascular/Lymphatic: Normal caliber abdominal aorta. Patent portal,
splenic, hepatic and renal veins. No pathologically enlarged lymph
nodes in the abdomen or pelvis.

Reproductive: Status post hysterectomy, with no abnormal findings at
the vaginal cuff. No adnexal mass.

Other: No pneumoperitoneum, ascites or focal fluid collection.

Musculoskeletal: No aggressive appearing focal osseous lesions.
Stable nonspecific non expansile sclerotic lesion in left posterior
acetabulum, probably a benign bone island. Marked lumbar
spondylosis.

Review of the MIP images confirms the above findings.
IMPRESSION: 1. No evidence of pulmonary embolism.
2. Subsolid 1.4 cm posterior right lower lobe pulmonary nodule with
solid 7 mm component, mildly increased in size since a 7065 chest CT
study. Slowly growing primary bronchogenic adenocarcinoma cannot be
excluded. Thoracic surgical consultation advised, which can be
sought on a short term outpatient basis. PET-CT may be considered.
Continued chest CT surveillance advised if this nodule is not
resected.
3. Patchy bandlike consolidation in the left upper lobe, probably
representing evolving postinfectious scarring given the improvement
on recent chest radiographs.
4. Mild cardiomegaly.
5. Stable chronic mild mediastinal and right hilar lymphadenopathy,
nonspecific, probably reactive.
6. No acute abnormality in the abdomen or pelvis. No evidence of
bowel obstruction or acute bowel inflammation. Mild scattered
colonic diverticulosis, with no evidence of acute diverticulitis.
7. Cholelithiasis.
8. Stable left adrenal adenoma.

## 2018-06-13 LAB — URINE CULTURE

## 2018-06-14 ENCOUNTER — Telehealth: Payer: Self-pay | Admitting: *Deleted

## 2018-06-14 NOTE — Telephone Encounter (Signed)
Post ED Visit - Positive Culture Follow-up  Culture report reviewed by antimicrobial stewardship pharmacist: [place] Team []  Elenor Quinones, Pharm.D. []  Heide Guile, Pharm.D., BCPS AQ-ID []  Parks Neptune, Pharm.D., BCPS []  Alycia Rossetti, Pharm.D., BCPS []  [place], Pharm.D., BCPS, AAHIVP []  Legrand Como, Pharm.D., BCPS, AAHIVP []  Salome Arnt, PharmD, BCPS []  Johnnette Gourd, PharmD, BCPS []  Hughes Better, PharmD, BCPS []  Leeroy Cha, PharmD []  Laqueta Linden, PharmD, BCPS []  Albertina Parr, PharmD  [place] Team []  Leodis Sias, PharmD []  Lindell Spar, PharmD []  Royetta Asal, PharmD []  Graylin Shiver, Rph []  Rema Fendt) Glennon Mac, PharmD []  Arlyn Dunning, PharmD []  Netta Cedars, PharmD []  Dia Sitter, PharmD []  Leone Haven, PharmD []  Gretta Arab, PharmD []  Theodis Shove, PharmD []  Peggyann Juba, PharmD []  Reuel Boom, PharmD   Positive urine culture, reviewed by Elicia Lamp, PharmD Treated with Cephalexin, organism sensitive to the same and no further patient follow-up is required at this time.  Harlon Flor [facility] 06/14/2018, 10:25 AM

## 2018-06-26 ENCOUNTER — Emergency Department (HOSPITAL_COMMUNITY): Payer: Medicare Other

## 2018-06-26 ENCOUNTER — Other Ambulatory Visit: Payer: Self-pay

## 2018-06-26 ENCOUNTER — Emergency Department (HOSPITAL_COMMUNITY)
Admission: EM | Admit: 2018-06-26 | Discharge: 2018-06-26 | Disposition: A | Discharge status: Home / Self Care | Payer: Medicare Other | Attending: Emergency Medicine | Admitting: Emergency Medicine

## 2018-06-26 DIAGNOSIS — I509 Heart failure, unspecified: Secondary | ICD-10-CM | POA: Insufficient documentation

## 2018-06-26 DIAGNOSIS — F17210 Nicotine dependence, cigarettes, uncomplicated: Secondary | ICD-10-CM | POA: Diagnosis not present

## 2018-06-26 DIAGNOSIS — Z7982 Long term (current) use of aspirin: Secondary | ICD-10-CM | POA: Diagnosis not present

## 2018-06-26 DIAGNOSIS — W010XXA Fall on same level from slipping, tripping and stumbling without subsequent striking against object, initial encounter: Secondary | ICD-10-CM | POA: Diagnosis not present

## 2018-06-26 DIAGNOSIS — Y92009 Unspecified place in unspecified non-institutional (private) residence as the place of occurrence of the external cause: Secondary | ICD-10-CM | POA: Diagnosis not present

## 2018-06-26 DIAGNOSIS — Z794 Long term (current) use of insulin: Secondary | ICD-10-CM | POA: Diagnosis not present

## 2018-06-26 DIAGNOSIS — M25522 Pain in left elbow: Secondary | ICD-10-CM | POA: Diagnosis not present

## 2018-06-26 DIAGNOSIS — S52042A Displaced fracture of coronoid process of left ulna, initial encounter for closed fracture: Secondary | ICD-10-CM

## 2018-06-26 DIAGNOSIS — I110 Hypertensive heart disease with heart failure: Secondary | ICD-10-CM | POA: Diagnosis not present

## 2018-06-26 DIAGNOSIS — Y999 Unspecified external cause status: Secondary | ICD-10-CM | POA: Diagnosis not present

## 2018-06-26 DIAGNOSIS — Z79899 Other long term (current) drug therapy: Secondary | ICD-10-CM | POA: Insufficient documentation

## 2018-06-26 DIAGNOSIS — M1712 Unilateral primary osteoarthritis, left knee: Secondary | ICD-10-CM

## 2018-06-26 DIAGNOSIS — Y939 Activity, unspecified: Secondary | ICD-10-CM | POA: Diagnosis not present

## 2018-06-26 DIAGNOSIS — E119 Type 2 diabetes mellitus without complications: Secondary | ICD-10-CM | POA: Insufficient documentation

## 2018-06-26 DIAGNOSIS — Z8673 Personal history of transient ischemic attack (TIA), and cerebral infarction without residual deficits: Secondary | ICD-10-CM | POA: Insufficient documentation

## 2018-06-26 DIAGNOSIS — R51 Headache: Secondary | ICD-10-CM | POA: Insufficient documentation

## 2018-06-26 DIAGNOSIS — M13862 Other specified arthritis, left knee: Secondary | ICD-10-CM | POA: Diagnosis not present

## 2018-06-26 DIAGNOSIS — S8992XA Unspecified injury of left lower leg, initial encounter: Secondary | ICD-10-CM | POA: Diagnosis present

## 2018-06-26 MED ORDER — TRAMADOL HCL 50 MG PO TABS
50.0000 mg | ORAL_TABLET | Freq: Once | ORAL | Status: AC
  Administered 2018-06-26: 50 mg via ORAL
  Filled 2018-06-26: qty 1

## 2018-06-26 NOTE — ED Notes (Signed)
Pt moved to hall bed D while waiting for [facility] transport home. No acute distress.

## 2018-06-26 NOTE — ED Notes (Signed)
Pt and family given and verbalized understanding of d/c instructions and how to watch for compartment syndrome. Told to return if s/s worsen. No further distress or questions at this time. Pt being transported home by [facility] d/t inability to ambulate and get home safely.

## 2018-06-26 NOTE — ED Provider Notes (Signed)
  Face-to-face evaluation   History: Patient here for evaluation of injuries from fall, she states that "my knees gave way and he fell forward.  Primary complaint of left elbow pain.  There is no loss of consciousness.  She denies neck pain.  Physical exam: Obese, alert, somewhat uncomfortable.  There is against movement of the left elbow secondary to pain.  There is mild tenderness palpated about the left elbow.  She has mild anterior shoulder tenderness left, without deformity.  Neurovascular intact distally in the left hand.  Mild left knee tenderness, primarily medial.  Medical screening examination/treatment/procedure(s) were conducted as a shared visit with non-physician practitioner(s) and myself.  I personally evaluated the patient during the encounter    Daleen Bo, MD 06/28/18 1018

## 2018-06-26 NOTE — ED Notes (Signed)
Bed: HN96 Expected date:  Expected time:  Means of arrival:  Comments: EMS arm pain/fall to 18

## 2018-06-26 NOTE — ED Triage Notes (Signed)
PtBIBA from c/o mechanical fall while in bathroom, 1030 yesterday. Pt reports right knee gave out, falling and hitting chin on coffee table, denies LOC.  Pt takes ASA.

## 2018-06-26 NOTE — Discharge Instructions (Addendum)
You have been diagnosed today with left elbow fracture and left knee arthritis.  At this time there does not appear to be the presence of an emergent medical condition, however there is always the potential for conditions to change. Please read and follow the below instructions.  Please return to the Emergency Department immediately for any new or worsening symptoms. Please be sure to follow up with your Primary Care Provider within one week regarding your visit today; please call their office to schedule an appointment even if you are feeling better for a follow-up visit. These follow-up with the orthopedic specialist at [facility], Dr. Alvan Dame, regarding your elbow fracture and knee arthritis.  Please call their office tomorrow to schedule an appointment for sometime next week for further evaluation.  This needs to be evaluated by an orthopedic specialist for cast placement and reevaluation. Please use rest, ice and elevation to help with your pain.  You may use over-the-counter medication such as Tylenol to help with your symptoms.  Please only use Tylenol as directed on the packaging. Your blood pressure was elevated today.  Please follow-up with your primary care provider within 1 week for blood pressure recheck and medication management.  Get help right away if: You cannot move your fingers. You have severe pain. Your skin turns red. You develop a rash. You have pain that gets worse. You have a fever along with joint or muscle aches. Your fingers or your hand: Become numb, cold, or pale. Turn a bluish color. Get help right away if: Your pain gets worse. The injured area tingles, gets numb, or turns blue and cold. The part of your body above or below the cast is swollen and it turns a different color (is discolored). You cannot feel or move your fingers or toes. There is fluid leaking through the cast. You have very bad pain or pressure under the cast. You have trouble breathing. You  have shortness of breath. You have chest pain. Any new or concerning symptoms Get help right away if: You get a very bad headache. You start to feel confused. You feel weak or numb. You feel faint. You get very bad pain in your: Chest. Belly (abdomen). You throw up (vomit) more than once. You have trouble breathing.  Please read the additional information packets attached to your discharge summary.  Do not take your medicine if  develop an itchy rash, swelling in your mouth or lips, or difficulty breathing.

## 2018-06-26 NOTE — ED Notes (Signed)
Patient transported to X-ray 
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
## 2018-07-03 ENCOUNTER — Other Ambulatory Visit: Payer: Self-pay | Admitting: Endocrinology

## 2018-07-03 NOTE — Telephone Encounter (Signed)
Insulin: Please refill prn  Non-dm rx .pr3

## 2018-08-08 ENCOUNTER — Other Ambulatory Visit: Payer: Self-pay | Admitting: Endocrinology

## 2018-08-08 NOTE — Telephone Encounter (Signed)
Please refill x 3 months Further refills would have to be considered by new PCP   

## 2018-08-25 ENCOUNTER — Other Ambulatory Visit: Payer: Self-pay | Admitting: Endocrinology

## 2018-09-02 ENCOUNTER — Other Ambulatory Visit: Payer: Self-pay | Admitting: Endocrinology

## 2018-09-03 NOTE — Telephone Encounter (Signed)
Please refill x 1 F/u is due  

## 2018-09-15 ENCOUNTER — Other Ambulatory Visit: Payer: Self-pay

## 2018-09-19 ENCOUNTER — Ambulatory Visit: Payer: Medicare Other | Admitting: Endocrinology

## 2018-09-29 ENCOUNTER — Other Ambulatory Visit: Payer: Self-pay

## 2018-09-29 ENCOUNTER — Emergency Department (HOSPITAL_COMMUNITY): Payer: Medicare Other

## 2018-09-29 ENCOUNTER — Emergency Department (HOSPITAL_COMMUNITY)
Admission: EM | Admit: 2018-09-29 | Discharge: 2018-09-30 | Disposition: A | Discharge status: Home / Self Care | Payer: Medicare Other | Attending: Emergency Medicine | Admitting: Emergency Medicine

## 2018-09-29 ENCOUNTER — Encounter (HOSPITAL_COMMUNITY): Payer: Self-pay | Admitting: Emergency Medicine

## 2018-09-29 DIAGNOSIS — R609 Edema, unspecified: Secondary | ICD-10-CM

## 2018-09-29 DIAGNOSIS — Y999 Unspecified external cause status: Secondary | ICD-10-CM | POA: Diagnosis not present

## 2018-09-29 DIAGNOSIS — R0602 Shortness of breath: Secondary | ICD-10-CM | POA: Insufficient documentation

## 2018-09-29 DIAGNOSIS — S91302A Unspecified open wound, left foot, initial encounter: Secondary | ICD-10-CM

## 2018-09-29 DIAGNOSIS — Y939 Activity, unspecified: Secondary | ICD-10-CM | POA: Insufficient documentation

## 2018-09-29 DIAGNOSIS — Y929 Unspecified place or not applicable: Secondary | ICD-10-CM | POA: Diagnosis not present

## 2018-09-29 DIAGNOSIS — Z79899 Other long term (current) drug therapy: Secondary | ICD-10-CM | POA: Insufficient documentation

## 2018-09-29 DIAGNOSIS — I110 Hypertensive heart disease with heart failure: Secondary | ICD-10-CM | POA: Insufficient documentation

## 2018-09-29 DIAGNOSIS — J449 Chronic obstructive pulmonary disease, unspecified: Secondary | ICD-10-CM | POA: Insufficient documentation

## 2018-09-29 DIAGNOSIS — Z7982 Long term (current) use of aspirin: Secondary | ICD-10-CM | POA: Insufficient documentation

## 2018-09-29 DIAGNOSIS — R2243 Localized swelling, mass and lump, lower limb, bilateral: Secondary | ICD-10-CM | POA: Diagnosis not present

## 2018-09-29 DIAGNOSIS — W19XXXA Unspecified fall, initial encounter: Secondary | ICD-10-CM | POA: Insufficient documentation

## 2018-09-29 DIAGNOSIS — F17210 Nicotine dependence, cigarettes, uncomplicated: Secondary | ICD-10-CM | POA: Insufficient documentation

## 2018-09-29 DIAGNOSIS — E119 Type 2 diabetes mellitus without complications: Secondary | ICD-10-CM | POA: Diagnosis not present

## 2018-09-29 DIAGNOSIS — Z8673 Personal history of transient ischemic attack (TIA), and cerebral infarction without residual deficits: Secondary | ICD-10-CM | POA: Diagnosis not present

## 2018-09-29 DIAGNOSIS — I509 Heart failure, unspecified: Secondary | ICD-10-CM | POA: Insufficient documentation

## 2018-09-29 DIAGNOSIS — Z794 Long term (current) use of insulin: Secondary | ICD-10-CM | POA: Diagnosis not present

## 2018-09-29 DIAGNOSIS — R600 Localized edema: Secondary | ICD-10-CM

## 2018-09-29 LAB — CBC WITH DIFFERENTIAL/PLATELET
Abs Immature Granulocytes: 0.04 10*3/uL (ref 0.00–0.07)
Basophils Absolute: 0 10*3/uL (ref 0.0–0.1)
Basophils Relative: 0 %
Eosinophils Absolute: 0.1 10*3/uL (ref 0.0–0.5)
Eosinophils Relative: 2 %
HCT: 42.7 % (ref 36.0–46.0)
Hemoglobin: 13.5 g/dL (ref 12.0–15.0)
Immature Granulocytes: 1 %
Lymphocytes Relative: 33 %
Lymphs Abs: 2.4 10*3/uL (ref 0.7–4.0)
MCH: 32 pg (ref 26.0–34.0)
MCHC: 31.6 g/dL (ref 30.0–36.0)
MCV: 101.2 fL — ABNORMAL HIGH (ref 80.0–100.0)
Monocytes Absolute: 0.6 10*3/uL (ref 0.1–1.0)
Monocytes Relative: 9 %
Neutro Abs: 4.2 10*3/uL (ref 1.7–7.7)
Neutrophils Relative %: 55 %
Platelets: 216 10*3/uL (ref 150–400)
RBC: 4.22 MIL/uL (ref 3.87–5.11)
RDW: 13.1 % (ref 11.5–15.5)
WBC: 7.4 10*3/uL (ref 4.0–10.5)
nRBC: 0 % (ref 0.0–0.2)

## 2018-09-29 LAB — COMPREHENSIVE METABOLIC PANEL
ALT: 11 U/L (ref 0–44)
AST: 15 U/L (ref 15–41)
Albumin: 3 g/dL — ABNORMAL LOW (ref 3.5–5.0)
Alkaline Phosphatase: 82 U/L (ref 38–126)
Anion gap: 9 (ref 5–15)
BUN: 9 mg/dL (ref 8–23)
CO2: 29 mmol/L (ref 22–32)
Calcium: 8.9 mg/dL (ref 8.9–10.3)
Chloride: 103 mmol/L (ref 98–111)
Creatinine, Ser: 1.07 mg/dL — ABNORMAL HIGH (ref 0.44–1.00)
GFR calc Af Amer: 58 mL/min — ABNORMAL LOW (ref >60)
GFR calc non Af Amer: 50 mL/min — ABNORMAL LOW (ref >60)
Glucose, Bld: 94 mg/dL (ref 70–99)
Potassium: 3.5 mmol/L (ref 3.5–5.1)
Sodium: 141 mmol/L (ref 135–145)
Total Bilirubin: 0.6 mg/dL (ref 0.3–1.2)
Total Protein: 6.6 g/dL (ref 6.5–8.1)

## 2018-09-29 LAB — BRAIN NATRIURETIC PEPTIDE: B Natriuretic Peptide: 86.8 pg/mL (ref 0.0–100.0)

## 2018-09-29 LAB — TROPONIN I
Troponin I: 0.04 ng/mL (ref <0.03)
Troponin I: 0.04 ng/mL (ref <0.03)

## 2018-09-29 LAB — CBG MONITORING, ED: Glucose-Capillary: 83 mg/dL (ref 70–99)

## 2018-09-29 MED ORDER — DOXYCYCLINE HYCLATE 100 MG PO TABS
100.0000 mg | ORAL_TABLET | Freq: Once | ORAL | Status: AC
  Administered 2018-09-30: 100 mg via ORAL
  Filled 2018-09-29: qty 1

## 2018-09-29 MED ORDER — BACITRACIN ZINC 500 UNIT/GM EX OINT: 1.0000 "application " | TOPICAL_OINTMENT | Freq: Two times a day (BID) | CUTANEOUS | Status: DC

## 2018-09-29 MED ORDER — METOPROLOL TARTRATE 5 MG/5ML IV SOLN: 5.0000 mg | Freq: Once | INTRAVENOUS | Status: DC
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
## 2018-09-29 NOTE — ED Triage Notes (Signed)
Per EMS- pt here for eval of a mechanical fall that happened several days ago. Pt has wound to left toes, pt describes them as blisters. Pt requesting eval of both feet.

## 2018-09-29 NOTE — ED Notes (Signed)
Patient transported to X-ray 

## 2018-09-30 MED ORDER — DOXYCYCLINE HYCLATE 100 MG PO CAPS: 100.0000 mg | ORAL_CAPSULE | Freq: Two times a day (BID) | ORAL | 0 refills | Status: DC

## 2018-09-30 MED ORDER — ACETAMINOPHEN 500 MG PO TABS
1000.0000 mg | ORAL_TABLET | Freq: Once | ORAL | Status: AC
  Administered 2018-09-30: 1000 mg via ORAL
  Filled 2018-09-30: qty 2

## 2018-09-30 NOTE — ED Provider Notes (Signed)
12:00 AM  Assumed care from Dr. Rex Kras.  Patient is a 78 year old female with history of diabetes, obesity, CHF, COPD who presents to the emergency department with a wound to the left foot for the past 2 weeks.  She has bilateral lower extremity edema and some edema in her abdomen.  She denies any chest pain or shortness of breath.  No fever.  Wound appears mildly infected and she is getting doxycycline.  She appeared short of breath per previous physician and has had 2 troponins that are slightly positive at 0.04 but stable.  Chest x-ray clear.  Plan is to ambulate with pulse oximetry.  1:35 AM  Pt reports that she does not ambulate at baseline.  I have reevaluated her and she is not tachypneic, hypoxic and has no increased work of breathing.  Her lungs are clear to auscultation.  She again denies chest pain, chest discomfort or shortness of breath.  I recommended close follow-up with her primary care doctor.  Will discharge with prescriptions of doxycycline.  She is requesting Tylenol for pain for her foot.  Will give dose prior to discharge home.   At this time, I do not feel there is any life-threatening condition present. I have reviewed and discussed all results (EKG, imaging, lab, urine as appropriate) and exam findings with patient/family. I have reviewed nursing notes and appropriate previous records.  I feel the patient is safe to be discharged home without further emergent workup and can continue workup as an outpatient as needed. Discussed usual and customary return precautions. Patient/family verbalize understanding and are comfortable with this plan.  Outpatient follow-up has been provided as needed. All questions have been answered.    Tiyon Sanor, Delice Bison, DO 09/30/18 [phone]

## 2018-09-30 NOTE — ED Notes (Signed)
Asked pt if she was able to walk, pt stated that she does not walk she uses a power chair at home.

## 2018-09-30 NOTE — ED Notes (Signed)
Patient denies pain and is resting comfortably.  

## 2018-09-30 NOTE — Discharge Instructions (Addendum)
You may take over-the-counter Tylenol 1000 mg every 6 hours as needed for pain. 

## 2018-10-23 ENCOUNTER — Ambulatory Visit: Payer: Medicare Other | Admitting: Podiatry

## 2018-10-30 ENCOUNTER — Ambulatory Visit: Payer: Medicare Other | Admitting: Podiatry

## 2018-11-01 ENCOUNTER — Ambulatory Visit: Payer: Medicare Other | Admitting: Podiatry

## 2018-11-01 ENCOUNTER — Other Ambulatory Visit: Payer: Self-pay

## 2018-11-01 ENCOUNTER — Encounter: Payer: Self-pay | Admitting: Podiatry

## 2018-11-01 VITALS — Temp 98.1°F

## 2018-11-01 DIAGNOSIS — L97522 Non-pressure chronic ulcer of other part of left foot with fat layer exposed: Secondary | ICD-10-CM | POA: Diagnosis not present

## 2018-11-01 DIAGNOSIS — E0843 Diabetes mellitus due to underlying condition with diabetic autonomic (poly)neuropathy: Secondary | ICD-10-CM

## 2018-11-01 DIAGNOSIS — I739 Peripheral vascular disease, unspecified: Secondary | ICD-10-CM | POA: Diagnosis not present

## 2018-11-01 MED ORDER — DOXYCYCLINE HYCLATE 100 MG PO TABS: 100.0000 mg | ORAL_TABLET | Freq: Two times a day (BID) | ORAL | 0 refills | Status: DC

## 2018-11-01 MED ORDER — HYDROCODONE-ACETAMINOPHEN 5-325 MG PO TABS: 1.0000 | ORAL_TABLET | Freq: Four times a day (QID) | ORAL | 0 refills | Status: DC | PRN

## 2018-11-02 ENCOUNTER — Telehealth: Payer: Self-pay | Admitting: *Deleted

## 2018-11-02 DIAGNOSIS — I739 Peripheral vascular disease, unspecified: Secondary | ICD-10-CM

## 2018-11-02 DIAGNOSIS — L97522 Non-pressure chronic ulcer of other part of left foot with fat layer exposed: Secondary | ICD-10-CM

## 2018-11-02 DIAGNOSIS — E0843 Diabetes mellitus due to underlying condition with diabetic autonomic (poly)neuropathy: Secondary | ICD-10-CM

## 2018-11-02 NOTE — Telephone Encounter (Signed)
Faxed orders to [facility]. 

## 2018-11-02 NOTE — Telephone Encounter (Signed)
-----   Message from Edrick Kins, DPM sent at 11/01/2018  6:26 PM EDT ----- Regarding: arterial doppler b/l Please order arterial Dopplers bilateral lower extremities Diagnosis: Ischemic ulcer left foot.  Diminished pedal pulses bilateral Thanks, Dr. Amalia Hailey

## 2018-11-05 NOTE — Progress Notes (Signed)
Subjective:  78 y.o. female with PMHx of diabetes mellitus presenting today with a chief complaint of ulcerations noted to bilateral feet that have been present for the past few weeks. She reports associated pain that has been worsening. She has not had any treatment for the symptoms. Bearing weight increases the pain. Patient is here for further evaluation and treatment.   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     CHF (congestive heart failure) (HCC)    Cholelithiasis    Chronic back pain    CIRRHOSIS 10/29/2006   COLONIC POLYPS 09/13/2007   COPD 04/22/2009   DEGENERATIVE JOINT DISEASE 06/15/2007   DEPRESSION 02/22/2008   Diverticulosis    Fatty liver    Gastritis    Gastroparesis    GERD 10/29/2006   Headache(784.0)    "not everyday now" (11/22/2012)   HYPERCHOLESTEROLEMIA 02/25/2009   HYPERTENSION 12/20/2008   Migraine    Myocardial infarction ([place])    Obesity    OSA (obstructive sleep apnea)    "quit wearing my CPAP" (11/22/2012)   OSTEOPOROSIS 10/29/2006   SEIZURE DISORDER    "silent seizures" (11/22/2012)   Shortness of breath    "off and on" (11/22/2012)   Stroke ([place]) 04/2004   Jerrol Banana 04/22/2004; "still weaker on the right" (11/22/2012)   Type II diabetes mellitus ([place])       Objective/Physical Exam General: The patient is alert and oriented x3 in no acute distress.  Dermatology:  Wound #1 noted to the right great toe measuring 0.3 x 0.3 x 0.1 cm (LxWxD).   Wound #2 noted to the left plantar arch measuring 1.0 x 1.0 x 0.1  cm.   Wound #3 noted to the left fifth toe measuring 3.5. x 1.5 x 0.3 cm.   To the noted ulceration(s), there is no eschar. There is a moderate amount of slough, fibrin, and necrotic tissue noted. Granulation tissue and wound base is red. There is a minimal amount of serosanguineous drainage noted. There is no exposed bone muscle-tendon ligament or joint. There is no malodor. Periwound integrity is intact. Skin is warm, dry and supple bilateral lower extremities.  Vascular: Diminished pedal pulses bilaterally. No edema or erythema noted. Capillary refill within normal limits.  Neurological: Epicritic and protective threshold diminished bilaterally.   Musculoskeletal Exam: Range of motion within normal limits to all pedal and ankle joints bilateral. Muscle strength 5/5 in all groups bilateral.   Assessment: 1. Ulcerations noted to the bilateral feet secondary to diabetes mellitus 2. PVD BLE 3. diabetes mellitus w/ peripheral neuropathy   Plan of Care:  1. Patient was evaluated. 2. medically necessary excisional debridement including subcutaneous tissue was performed using a tissue nipper and a chisel blade. Excisional debridement of all the necrotic nonviable tissue down to healthy bleeding viable tissue was performed with post-debridement measurements same as pre-. 3. the wound was cleansed and dry sterile dressing applied. 4. Prescription for Doxycycline provided to patient.  5. Recommended using Betadine daily with a dry sterile dressing.  6. Post op shoe dispensed for the  left foot.  7. Order for arterial dopplers placed.  8. Patient is to return to clinic in 3 weeks.  Lona Kettle (daughter) [phone]   Edrick Kins, DPM [facility]  Dr. Edrick Kins, [address]                                        [place], [place] [address]                Office [phone]  Fax [phone]

## 2018-11-14 ENCOUNTER — Ambulatory Visit (HOSPITAL_COMMUNITY)
Admission: RE | Admit: 2018-11-14 | Discharge: 2018-11-14 | Disposition: A | Discharge status: Home / Self Care | Payer: Medicare Other | Source: Ambulatory Visit | Attending: Cardiology | Admitting: Cardiology

## 2018-11-14 ENCOUNTER — Other Ambulatory Visit: Payer: Self-pay

## 2018-11-14 DIAGNOSIS — I739 Peripheral vascular disease, unspecified: Secondary | ICD-10-CM | POA: Diagnosis present

## 2018-11-14 DIAGNOSIS — E0843 Diabetes mellitus due to underlying condition with diabetic autonomic (poly)neuropathy: Secondary | ICD-10-CM | POA: Diagnosis present

## 2018-11-14 DIAGNOSIS — L97522 Non-pressure chronic ulcer of other part of left foot with fat layer exposed: Secondary | ICD-10-CM

## 2018-11-27 ENCOUNTER — Ambulatory Visit (INDEPENDENT_AMBULATORY_CARE_PROVIDER_SITE_OTHER): Payer: Medicare Other | Admitting: Podiatry

## 2018-11-27 ENCOUNTER — Other Ambulatory Visit: Payer: Self-pay

## 2018-11-27 ENCOUNTER — Encounter: Payer: Self-pay | Admitting: Podiatry

## 2018-11-27 VITALS — Temp 98.9°F

## 2018-11-27 DIAGNOSIS — E0843 Diabetes mellitus due to underlying condition with diabetic autonomic (poly)neuropathy: Secondary | ICD-10-CM

## 2018-11-27 DIAGNOSIS — L97522 Non-pressure chronic ulcer of other part of left foot with fat layer exposed: Secondary | ICD-10-CM

## 2018-11-27 DIAGNOSIS — I739 Peripheral vascular disease, unspecified: Secondary | ICD-10-CM

## 2018-11-28 ENCOUNTER — Telehealth: Payer: Self-pay | Admitting: *Deleted

## 2018-11-28 NOTE — Telephone Encounter (Signed)
Faxed required form, orders and clinicals with demographics to [facility].

## 2018-11-28 NOTE — Telephone Encounter (Signed)
-----   Message from Edrick Kins, DPM sent at 11/27/2018  2:22 PM EDT ----- Regarding: Home health dressing changes Please order home health dressing changes every other day x 6 weeks.   Dx: ulcer left foot secondary to DM. 3.5x2.5x0.3.   Debrided today.   Sig: cleanse with NS. Apply Prisma with overlying Aquacel Ag and dry sterile dressings. Wrap with light ace wrap.   Thanks, Dr. Amalia Hailey

## 2018-11-29 ENCOUNTER — Telehealth: Payer: Self-pay | Admitting: Podiatry

## 2018-11-29 NOTE — Telephone Encounter (Signed)
Caregiver called and stated that she has sores on legs with puss also. Stated that she will call patient's PCP also to inform.

## 2018-11-29 NOTE — Telephone Encounter (Addendum)
I spoke with Dawn, pt's caregiver and pt's dtr called this morning and pt has an appt Friday with PCP for the posterior ankle wounds and cloudy drainage, no fever, redness or streaks, but is tight. I told Dawn that a [facility] referral had been made to [facility], and I would also inform Dr. Amalia Hailey.

## 2018-11-29 NOTE — Progress Notes (Signed)
Subjective:  78 y.o. female with PMHx of diabetes mellitus presenting today for follow up evaluation of an ulceration of the left foot. She reports redness and swelling of the wound of the left 5th digit. She has taken the course of Doxycycline that was prescribed at her last appointment and has been using Betadine as directed. There are no worsening factors noted. She had an arterial doppler study done on 11/14/2018. Patient is here for further evaluation and treatment.    Past Medical History:  Diagnosis Date  . ALLERGIC RHINITIS 10/29/2006  . Arthritis    "hands, feet, legs, arms" (11/22/2012)  . ASYMPTOMATIC POSTMENOPAUSAL STATUS 02/22/2008  . 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 CHEST PAIN 02/22/2008   LHC in 7/05 and 1/11: normal; Myoview in 11/10 that demonstrated an EF of 51% and slight reversible anterior and septal defect of borderline significance (false + test);  Echo 04/2009:  Mild LVH, EF 55-60%, Gr 1 DD, MAC.    [name] CHF (congestive heart failure) ([place])   . Cholelithiasis   . Chronic back pain   . CIRRHOSIS 10/29/2006  . COLONIC POLYPS 09/13/2007  . COPD 04/22/2009  . DEGENERATIVE JOINT DISEASE 06/15/2007  . DEPRESSION 02/22/2008  . Diverticulosis   . Fatty liver   . Gastritis   . Gastroparesis   . GERD 10/29/2006  . Headache(784.0)    "not everyday now" (11/22/2012)  . HYPERCHOLESTEROLEMIA 02/25/2009  . HYPERTENSION 12/20/2008  . Migraine   . Myocardial infarction ([place])   . Obesity   . OSA (obstructive sleep apnea)    "quit wearing my CPAP" (11/22/2012)  . OSTEOPOROSIS 10/29/2006  . SEIZURE DISORDER    "silent seizures" (11/22/2012)  . Shortness of breath    "off and on" (11/22/2012)  . Stroke [facility]) 04/2004   Jerrol Banana 04/22/2004; "still weaker on the right" (11/22/2012)  . Type II diabetes mellitus ([place])       Objective/Physical Exam General: The patient is alert and oriented x3 in no acute distress.  Dermatology:  Wound #1 noted to the left 5th MTPJ  measuring 3.0 x 2.5 x 0.3 cm (LxWxD).    To the noted ulceration(s), there is no eschar. There is a moderate amount of slough, fibrin, and necrotic tissue noted. Granulation tissue and wound base is red. There is a minimal amount of serosanguineous drainage noted. There is no exposed bone muscle-tendon ligament or joint. There is no malodor. Periwound integrity is intact. Skin is warm, dry and supple bilateral lower extremities.  Vascular: Diminished pedal pulses bilaterally. No edema or erythema noted. Capillary refill within normal limits.  Neurological: Epicritic and protective threshold diminished bilaterally.   Musculoskeletal Exam: Range of motion within normal limits to all pedal and ankle joints bilateral. Muscle strength 5/5 in all groups bilateral.   Assessment: 1. Ulceration noted to the left 5th MTPJ secondary to diabetes mellitus 2. PVD BLE 3. diabetes mellitus w/ peripheral neuropathy   Plan of Care:  1. Patient was evaluated. 2. medically necessary excisional debridement including subcutaneous tissue was performed using a tissue nipper and a chisel blade. Excisional debridement of all the necrotic nonviable tissue down to healthy bleeding viable tissue was performed with post-debridement measurements same as pre-. 3. the wound was cleansed and dry sterile dressing applied. 4. Arterial dopplers reviewed.  5. Orders for home health dressing changes placed.  6. Return to clinic in 3 weeks.    Lona Kettle (daughter) [phone]   Edrick Kins, DPM [facility]  Dr. Edrick Kins, [address]                                        [place], [place] [address]                Office [phone]  Fax [phone]

## 2018-12-14 ENCOUNTER — Other Ambulatory Visit: Payer: Self-pay | Admitting: Endocrinology

## 2018-12-18 ENCOUNTER — Ambulatory Visit (INDEPENDENT_AMBULATORY_CARE_PROVIDER_SITE_OTHER): Payer: Medicare Other | Admitting: Podiatry

## 2018-12-18 ENCOUNTER — Other Ambulatory Visit: Payer: Self-pay

## 2018-12-18 ENCOUNTER — Encounter: Payer: Self-pay | Admitting: Podiatry

## 2018-12-18 DIAGNOSIS — L97522 Non-pressure chronic ulcer of other part of left foot with fat layer exposed: Secondary | ICD-10-CM

## 2018-12-18 DIAGNOSIS — E0843 Diabetes mellitus due to underlying condition with diabetic autonomic (poly)neuropathy: Secondary | ICD-10-CM

## 2018-12-20 NOTE — Progress Notes (Signed)
Subjective:  78 y.o. female with PMHx of diabetes mellitus presenting today for follow up evaluation of an ulceration of the left foot. She states she is doing well and the wound is improving. She has been having the wound redressed by home health. She denies any new concerns or complaints at this time. Patient is here for further evaluation and treatment.    Past Medical History:  Diagnosis Date  . ALLERGIC RHINITIS 10/29/2006  . Arthritis    "hands, feet, legs, arms" (11/22/2012)  . ASYMPTOMATIC POSTMENOPAUSAL STATUS 02/22/2008  . 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 CHEST PAIN 02/22/2008   LHC in 7/05 and 1/11: normal; Myoview in 11/10 that demonstrated an EF of 51% and slight reversible anterior and septal defect of borderline significance (false + test);  Echo 04/2009:  Mild LVH, EF 55-60%, Gr 1 DD, MAC.    [name] CHF (congestive heart failure) ([place])   . Cholelithiasis   . Chronic back pain   . CIRRHOSIS 10/29/2006  . COLONIC POLYPS 09/13/2007  . COPD 04/22/2009  . DEGENERATIVE JOINT DISEASE 06/15/2007  . DEPRESSION 02/22/2008  . Diverticulosis   . Fatty liver   . Gastritis   . Gastroparesis   . GERD 10/29/2006  . Headache(784.0)    "not everyday now" (11/22/2012)  . HYPERCHOLESTEROLEMIA 02/25/2009  . HYPERTENSION 12/20/2008  . Migraine   . Myocardial infarction ([place])   . Obesity   . OSA (obstructive sleep apnea)    "quit wearing my CPAP" (11/22/2012)  . OSTEOPOROSIS 10/29/2006  . SEIZURE DISORDER    "silent seizures" (11/22/2012)  . Shortness of breath    "off and on" (11/22/2012)  . Stroke [facility]) 04/2004   Jerrol Banana 04/22/2004; "still weaker on the right" (11/22/2012)  . Type II diabetes mellitus ([place])       Objective/Physical Exam General: The patient is alert and oriented x3 in no acute distress.  Dermatology:  Wound #1 noted to the left 5th MTPJ measuring 1.5 x 1.1 x 0.2 cm (LxWxD).    To the noted ulceration(s), there is no eschar. There is a moderate amount of  slough, fibrin, and necrotic tissue noted. Granulation tissue and wound base is red. There is a minimal amount of serosanguineous drainage noted. There is no exposed bone muscle-tendon ligament or joint. There is no malodor. Periwound integrity is intact. Skin is warm, dry and supple bilateral lower extremities.  Vascular: Diminished pedal pulses bilaterally. No edema or erythema noted. Capillary refill within normal limits.  Neurological: Epicritic and protective threshold diminished bilaterally.   Musculoskeletal Exam: Range of motion within normal limits to all pedal and ankle joints bilateral. Muscle strength 5/5 in all groups bilateral.   Assessment: 1. Ulceration noted to the left 5th MTPJ secondary to diabetes mellitus - improved  2. PVD BLE 3. diabetes mellitus w/ peripheral neuropathy   Plan of Care:  1. Patient was evaluated. 2. medically necessary excisional debridement including subcutaneous tissue was performed using a tissue nipper and a chisel blade. Excisional debridement of all the necrotic nonviable tissue down to healthy bleeding viable tissue was performed with post-debridement measurements same as pre-. 3. the wound was cleansed and dry sterile dressing applied. 4. Continue home health dressing changes.  5. Return to clinic in 4 weeks.     Lona Kettle (daughter) [phone]   Edrick Kins, DPM [facility]  Dr. Edrick Kins, DPM    [address]  [place], [place] [address]                Office [phone]  Fax [phone]     

## 2019-01-09 ENCOUNTER — Telehealth: Payer: Self-pay | Admitting: Endocrinology

## 2019-01-09 NOTE — Telephone Encounter (Signed)
LMTCB to schedule appointment °

## 2019-01-09 NOTE — Telephone Encounter (Signed)
Re: Lantus: 1.  Please schedule f/u appt 2.  Then please refill x 1, pending that appt.

## 2019-01-09 NOTE — Telephone Encounter (Signed)
Per Dr. Loanne Drilling, unable to refill Humalog and Lantus without an appt. Routing this message to the front desk for scheduling purposes.

## 2019-01-12 NOTE — Telephone Encounter (Signed)
LMTCB x 2 to schedule appointment °

## 2019-01-15 ENCOUNTER — Ambulatory Visit (INDEPENDENT_AMBULATORY_CARE_PROVIDER_SITE_OTHER): Payer: Medicare Other | Admitting: Podiatry

## 2019-01-15 ENCOUNTER — Other Ambulatory Visit: Payer: Self-pay

## 2019-01-15 VITALS — Temp 97.7°F

## 2019-01-15 DIAGNOSIS — E1122 Type 2 diabetes mellitus with diabetic chronic kidney disease: Secondary | ICD-10-CM

## 2019-01-15 DIAGNOSIS — E0843 Diabetes mellitus due to underlying condition with diabetic autonomic (poly)neuropathy: Secondary | ICD-10-CM

## 2019-01-15 DIAGNOSIS — L97522 Non-pressure chronic ulcer of other part of left foot with fat layer exposed: Secondary | ICD-10-CM | POA: Diagnosis not present

## 2019-01-15 DIAGNOSIS — I70235 Atherosclerosis of native arteries of right leg with ulceration of other part of foot: Secondary | ICD-10-CM

## 2019-01-15 DIAGNOSIS — I739 Peripheral vascular disease, unspecified: Secondary | ICD-10-CM

## 2019-01-15 MED ORDER — INSULIN LISPRO (1 UNIT DIAL) 100 UNIT/ML (KWIKPEN): 70.0000 [IU] | PEN_INJECTOR | Freq: Every day | SUBCUTANEOUS | 0 refills | Status: DC

## 2019-01-15 MED ORDER — LANTUS SOLOSTAR 100 UNIT/ML ~~LOC~~ SOPN: 100.0000 [IU] | PEN_INJECTOR | Freq: Every day | SUBCUTANEOUS | 0 refills | Status: DC

## 2019-01-15 NOTE — Progress Notes (Signed)
Subjective:  78 y.o. female with PMHx of diabetes mellitus presenting today for follow up evaluation of an ulceration of the left foot. She states she is doing well. She reports improvement of the wound stating it is not as big as it was at the previous appointment. She continues to have home health change her dressings. She denies any worsening factors. Patient is here for further evaluation and treatment.    Past Medical History:  Diagnosis Date  . ALLERGIC RHINITIS 10/29/2006  . Arthritis    "hands, feet, legs, arms" (11/22/2012)  . ASYMPTOMATIC POSTMENOPAUSAL STATUS 02/22/2008  . 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 CHEST PAIN 02/22/2008   LHC in 7/05 and 1/11: normal; Myoview in 11/10 that demonstrated an EF of 51% and slight reversible anterior and septal defect of borderline significance (false + test);  Echo 04/2009:  Mild LVH, EF 55-60%, Gr 1 DD, MAC.    [name] CHF (congestive heart failure) ([place])   . Cholelithiasis   . Chronic back pain   . CIRRHOSIS 10/29/2006  . COLONIC POLYPS 09/13/2007  . COPD 04/22/2009  . DEGENERATIVE JOINT DISEASE 06/15/2007  . DEPRESSION 02/22/2008  . Diverticulosis   . Fatty liver   . Gastritis   . Gastroparesis   . GERD 10/29/2006  . Headache(784.0)    "not everyday now" (11/22/2012)  . HYPERCHOLESTEROLEMIA 02/25/2009  . HYPERTENSION 12/20/2008  . Migraine   . Myocardial infarction ([place])   . Obesity   . OSA (obstructive sleep apnea)    "quit wearing my CPAP" (11/22/2012)  . OSTEOPOROSIS 10/29/2006  . SEIZURE DISORDER    "silent seizures" (11/22/2012)  . Shortness of breath    "off and on" (11/22/2012)  . Stroke [facility]) 04/2004   Jerrol Banana 04/22/2004; "still weaker on the right" (11/22/2012)  . Type II diabetes mellitus ([place])       Objective/Physical Exam General: The patient is alert and oriented x3 in no acute distress.  Dermatology:  Wound #1 noted to the left 5th MTPJ measuring 0.5 x 0.5 x 0.1 cm (LxWxD).    To the noted ulceration(s),  there is no eschar. There is a moderate amount of slough, fibrin, and necrotic tissue noted. Granulation tissue and wound base is red. There is a minimal amount of serosanguineous drainage noted. There is no exposed bone muscle-tendon ligament or joint. There is no malodor. Periwound integrity is intact. Skin is warm, dry and supple bilateral lower extremities.  Vascular: Diminished pedal pulses bilaterally. No edema or erythema noted. Capillary refill within normal limits.  Neurological: Epicritic and protective threshold diminished bilaterally.   Musculoskeletal Exam: Range of motion within normal limits to all pedal and ankle joints bilateral. Muscle strength 5/5 in all groups bilateral.   Assessment: 1. Ulceration noted to the left 5th MTPJ secondary to diabetes mellitus - improved  2. PVD BLE 3. diabetes mellitus w/ peripheral neuropathy   Plan of Care:  1. Patient was evaluated. 2. medically necessary excisional debridement including subcutaneous tissue was performed using a tissue nipper and a chisel blade. Excisional debridement of all the necrotic nonviable tissue down to healthy bleeding viable tissue was performed with post-debridement measurements same as pre-. 3. the wound was cleansed and dry sterile dressing applied. 4. Continue home health dressing changes.  5. Return to clinic in 4 weeks.     Lona Kettle (daughter) [phone]   Edrick Kins, DPM [facility]  Dr. Edrick Kins, DPM    [address]  [place], [place] [address]                Office [phone]  Fax [phone]

## 2019-01-15 NOTE — Telephone Encounter (Signed)
E-Prescribing Status: Receipt confirmed by pharmacy (01/15/2019 7:29 AM EDT)

## 2019-01-17 ENCOUNTER — Other Ambulatory Visit: Payer: Self-pay

## 2019-01-19 ENCOUNTER — Ambulatory Visit (INDEPENDENT_AMBULATORY_CARE_PROVIDER_SITE_OTHER): Payer: Medicare Other | Admitting: Endocrinology

## 2019-01-19 ENCOUNTER — Encounter: Payer: Self-pay | Admitting: Endocrinology

## 2019-01-19 ENCOUNTER — Other Ambulatory Visit: Payer: Self-pay

## 2019-01-19 VITALS — BP 126/74 | HR 79 | Ht 72.0 in | Wt 350.8 lb

## 2019-01-19 DIAGNOSIS — N183 Chronic kidney disease, stage 3 unspecified: Secondary | ICD-10-CM

## 2019-01-19 DIAGNOSIS — Z794 Long term (current) use of insulin: Secondary | ICD-10-CM | POA: Diagnosis not present

## 2019-01-19 DIAGNOSIS — E1122 Type 2 diabetes mellitus with diabetic chronic kidney disease: Secondary | ICD-10-CM | POA: Diagnosis not present

## 2019-01-19 LAB — POCT GLYCOSYLATED HEMOGLOBIN (HGB A1C): Hemoglobin A1C: 9.4 % — AB (ref 4.0–5.6)

## 2019-01-19 MED ORDER — LANTUS SOLOSTAR 100 UNIT/ML ~~LOC~~ SOPN: 120.0000 [IU] | PEN_INJECTOR | SUBCUTANEOUS | 11 refills | Status: DC

## 2019-01-19 MED ORDER — INSULIN LISPRO (1 UNIT DIAL) 100 UNIT/ML (KWIKPEN): 50.0000 [IU] | PEN_INJECTOR | Freq: Every day | SUBCUTANEOUS | 11 refills | Status: DC

## 2019-01-19 MED ORDER — OZEMPIC (0.25 OR 0.5 MG/DOSE) 2 MG/1.5ML ~~LOC~~ SOPN: 0.2500 mg | PEN_INJECTOR | SUBCUTANEOUS | 11 refills | Status: DC

## 2019-01-19 NOTE — Patient Instructions (Addendum)
I have sent 2 prescriptions to your pharmacy: to increase the Lantus to 120 units each morning, and to decrease the Humalog to 50 units with supper, and: I have sent a prescription to your pharmacy, to start "Ozempic."  This will help, also   check your blood sugar twice a day.  vary the time of day when you check, between before the 3 meals, and at bedtime.  also check if you have symptoms of your blood sugar being too high or too low.  please keep a record of the readings and bring it to your next appointment here (or you can bring the meter itself).  You can write it on any piece of paper.  please call us sooner if your blood sugar goes below 70, or if you have a lot of readings over 200.   Please come back for a follow-up appointment in 2 months.
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
## 2019-02-09 ENCOUNTER — Emergency Department (HOSPITAL_COMMUNITY): Payer: Medicare Other

## 2019-02-09 ENCOUNTER — Emergency Department (HOSPITAL_COMMUNITY)
Admission: EM | Admit: 2019-02-09 | Discharge: 2019-02-10 | Disposition: A | Discharge status: Home / Self Care | Payer: Medicare Other | Attending: Emergency Medicine | Admitting: Emergency Medicine

## 2019-02-09 ENCOUNTER — Other Ambulatory Visit: Payer: Self-pay

## 2019-02-09 DIAGNOSIS — J449 Chronic obstructive pulmonary disease, unspecified: Secondary | ICD-10-CM | POA: Diagnosis not present

## 2019-02-09 DIAGNOSIS — Z7982 Long term (current) use of aspirin: Secondary | ICD-10-CM | POA: Insufficient documentation

## 2019-02-09 DIAGNOSIS — R0789 Other chest pain: Secondary | ICD-10-CM | POA: Diagnosis not present

## 2019-02-09 DIAGNOSIS — Z79899 Other long term (current) drug therapy: Secondary | ICD-10-CM | POA: Insufficient documentation

## 2019-02-09 DIAGNOSIS — Z794 Long term (current) use of insulin: Secondary | ICD-10-CM | POA: Diagnosis not present

## 2019-02-09 DIAGNOSIS — R0602 Shortness of breath: Secondary | ICD-10-CM | POA: Diagnosis not present

## 2019-02-09 DIAGNOSIS — E119 Type 2 diabetes mellitus without complications: Secondary | ICD-10-CM | POA: Insufficient documentation

## 2019-02-09 DIAGNOSIS — I5032 Chronic diastolic (congestive) heart failure: Secondary | ICD-10-CM | POA: Diagnosis not present

## 2019-02-09 DIAGNOSIS — I252 Old myocardial infarction: Secondary | ICD-10-CM | POA: Insufficient documentation

## 2019-02-09 DIAGNOSIS — F17210 Nicotine dependence, cigarettes, uncomplicated: Secondary | ICD-10-CM | POA: Diagnosis not present

## 2019-02-09 DIAGNOSIS — I110 Hypertensive heart disease with heart failure: Secondary | ICD-10-CM | POA: Diagnosis not present

## 2019-02-09 DIAGNOSIS — R079 Chest pain, unspecified: Secondary | ICD-10-CM

## 2019-02-09 LAB — BASIC METABOLIC PANEL
Anion gap: 8 (ref 5–15)
BUN: 7 mg/dL — ABNORMAL LOW (ref 8–23)
CO2: 28 mmol/L (ref 22–32)
Calcium: 8.6 mg/dL — ABNORMAL LOW (ref 8.9–10.3)
Chloride: 99 mmol/L (ref 98–111)
Creatinine, Ser: 0.98 mg/dL (ref 0.44–1.00)
GFR calc Af Amer: >60 mL/min (ref >60)
GFR calc non Af Amer: 56 mL/min — ABNORMAL LOW (ref >60)
Glucose, Bld: 242 mg/dL — ABNORMAL HIGH (ref 70–99)
Potassium: 3.7 mmol/L (ref 3.5–5.1)
Sodium: 135 mmol/L (ref 135–145)

## 2019-02-09 LAB — TROPONIN I (HIGH SENSITIVITY)
Troponin I (High Sensitivity): 5 ng/L (ref <18)
Troponin I (High Sensitivity): 5 ng/L (ref <18)

## 2019-02-09 LAB — CBC
HCT: 38.8 % (ref 36.0–46.0)
Hemoglobin: 12.3 g/dL (ref 12.0–15.0)
MCH: 30.9 pg (ref 26.0–34.0)
MCHC: 31.7 g/dL (ref 30.0–36.0)
MCV: 97.5 fL (ref 80.0–100.0)
Platelets: 232 10*3/uL (ref 150–400)
RBC: 3.98 MIL/uL (ref 3.87–5.11)
RDW: 13.6 % (ref 11.5–15.5)
WBC: 8.1 10*3/uL (ref 4.0–10.5)
nRBC: 0 % (ref 0.0–0.2)

## 2019-02-09 LAB — BRAIN NATRIURETIC PEPTIDE: B Natriuretic Peptide: 144.6 pg/mL — ABNORMAL HIGH (ref 0.0–100.0)

## 2019-02-09 NOTE — Discharge Instructions (Addendum)
Return here for any worsening in your condition.  I would advise taking 2 Lasix a day over the next 3 days.  Follow-up with your primary care doctor.
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
## 2019-02-09 NOTE — ED Triage Notes (Signed)
Pt Bib EMS for chest pain that radiates to R arm, began around 1000 today. EMS gave 324 mg aspirin, and 0.4 mg nitro. Pt has hx of diabetes, CHF, MI, current smoker. VSS

## 2019-02-10 NOTE — ED Notes (Signed)
Patient verbalizes understanding of discharge instructions. Opportunity for questioning and answers were provided. Armband removed by staff, pt discharged from ED ambulatory.   

## 2019-02-12 ENCOUNTER — Ambulatory Visit (INDEPENDENT_AMBULATORY_CARE_PROVIDER_SITE_OTHER): Payer: Medicare Other | Admitting: Podiatry

## 2019-02-12 ENCOUNTER — Other Ambulatory Visit: Payer: Self-pay

## 2019-02-12 DIAGNOSIS — I739 Peripheral vascular disease, unspecified: Secondary | ICD-10-CM

## 2019-02-12 DIAGNOSIS — L97522 Non-pressure chronic ulcer of other part of left foot with fat layer exposed: Secondary | ICD-10-CM | POA: Diagnosis not present

## 2019-02-12 DIAGNOSIS — E0843 Diabetes mellitus due to underlying condition with diabetic autonomic (poly)neuropathy: Secondary | ICD-10-CM | POA: Diagnosis not present

## 2019-02-14 NOTE — Progress Notes (Signed)
   Subjective:  78 y.o. female with PMHx of diabetes mellitus presenting today for follow up evaluation of an ulceration of the left foot. She states she is doing well. She reports flaking skin around the wound but states it has improved since her previous visit. Home health has been changing her dressings. She denies any modifying factors. Patient is here for further evaluation and treatment.   Past Medical History:  Diagnosis Date  . ALLERGIC RHINITIS 10/29/2006  . Arthritis    "hands, feet, legs, arms" (11/22/2012)  . ASYMPTOMATIC POSTMENOPAUSAL STATUS 02/22/2008  . 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 CHEST PAIN 02/22/2008   LHC in 7/05 and 1/11: normal; Myoview in 11/10 that demonstrated an EF of 51% and slight reversible anterior and septal defect of borderline significance (false + test);  Echo 04/2009:  Mild LVH, EF 55-60%, Gr 1 DD, MAC.    [name] CHF (congestive heart failure) ([place])   . Cholelithiasis   . Chronic back pain   . CIRRHOSIS 10/29/2006  . COLONIC POLYPS 09/13/2007  . COPD 04/22/2009  . DEGENERATIVE JOINT DISEASE 06/15/2007  . DEPRESSION 02/22/2008  . Diverticulosis   . Fatty liver   . Gastritis   . Gastroparesis   . GERD 10/29/2006  . Headache(784.0)    "not everyday now" (11/22/2012)  . HYPERCHOLESTEROLEMIA 02/25/2009  . HYPERTENSION 12/20/2008  . Migraine   . Myocardial infarction ([place])   . Obesity   . OSA (obstructive sleep apnea)    "quit wearing my CPAP" (11/22/2012)  . OSTEOPOROSIS 10/29/2006  . SEIZURE DISORDER    "silent seizures" (11/22/2012)  . Shortness of breath    "off and on" (11/22/2012)  . Stroke [facility]) 04/2004   Jerrol Banana 04/22/2004; "still weaker on the right" (11/22/2012)  . Type II diabetes mellitus ([place])       Objective/Physical Exam General: The patient is alert and oriented x3 in no acute distress.  Dermatology:  Wound noted to the left 5th MTPJ has healed. Complete re-epithelialization has occurred. No drainage noted. Skin is warm, dry  and supple bilateral lower extremities.  Vascular: Diminished pedal pulses bilaterally. No edema or erythema noted. Capillary refill within normal limits.  Neurological: Epicritic and protective threshold diminished bilaterally.   Musculoskeletal Exam: Range of motion within normal limits to all pedal and ankle joints bilateral. Muscle strength 5/5 in all groups bilateral.   Assessment: 1. Ulceration noted to the left 5th MTPJ secondary to diabetes mellitus - healed  2. PVD BLE 3. diabetes mellitus w/ peripheral neuropathy   Plan of Care:  1. Patient was evaluated. 2. Light debridement of the skin of the left 5th MPJ performed with a tissue nipper.  3. Appointment with Liliane Channel, Pedorthist, for DM shoes. 4. Return to clinic as needed.     Lona Kettle (daughter) [phone]   Edrick Kins, DPM [facility]  Dr. Edrick Kins, [address]                                        [place], [place] [address]                Office [phone]  Fax [phone]

## 2019-04-20 ENCOUNTER — Encounter (HOSPITAL_COMMUNITY): Payer: Self-pay | Admitting: Family Medicine

## 2019-04-20 ENCOUNTER — Emergency Department (HOSPITAL_COMMUNITY): Payer: Medicare PPO

## 2019-04-20 ENCOUNTER — Inpatient Hospital Stay (HOSPITAL_COMMUNITY)
Admission: EM | Admit: 2019-04-20 | Discharge: 2019-04-24 | DRG: 918 | Disposition: A | Discharge status: Home Health | Payer: Medicare PPO | Attending: Family Medicine | Admitting: Family Medicine

## 2019-04-20 ENCOUNTER — Other Ambulatory Visit: Payer: Self-pay

## 2019-04-20 ENCOUNTER — Inpatient Hospital Stay (HOSPITAL_COMMUNITY): Payer: Medicare PPO

## 2019-04-20 DIAGNOSIS — Z794 Long term (current) use of insulin: Secondary | ICD-10-CM | POA: Diagnosis not present

## 2019-04-20 DIAGNOSIS — E1140 Type 2 diabetes mellitus with diabetic neuropathy, unspecified: Secondary | ICD-10-CM | POA: Diagnosis present

## 2019-04-20 DIAGNOSIS — R569 Unspecified convulsions: Secondary | ICD-10-CM | POA: Diagnosis not present

## 2019-04-20 DIAGNOSIS — R32 Unspecified urinary incontinence: Secondary | ICD-10-CM | POA: Diagnosis present

## 2019-04-20 DIAGNOSIS — K7460 Unspecified cirrhosis of liver: Secondary | ICD-10-CM | POA: Diagnosis present

## 2019-04-20 DIAGNOSIS — R404 Transient alteration of awareness: Secondary | ICD-10-CM | POA: Diagnosis not present

## 2019-04-20 DIAGNOSIS — Z90710 Acquired absence of both cervix and uterus: Secondary | ICD-10-CM | POA: Diagnosis not present

## 2019-04-20 DIAGNOSIS — I5030 Unspecified diastolic (congestive) heart failure: Secondary | ICD-10-CM | POA: Diagnosis present

## 2019-04-20 DIAGNOSIS — Z79899 Other long term (current) drug therapy: Secondary | ICD-10-CM

## 2019-04-20 DIAGNOSIS — K5900 Constipation, unspecified: Secondary | ICD-10-CM | POA: Diagnosis not present

## 2019-04-20 DIAGNOSIS — Z03818 Encounter for observation for suspected exposure to other biological agents ruled out: Secondary | ICD-10-CM | POA: Diagnosis not present

## 2019-04-20 DIAGNOSIS — I69351 Hemiplegia and hemiparesis following cerebral infarction affecting right dominant side: Secondary | ICD-10-CM | POA: Diagnosis not present

## 2019-04-20 DIAGNOSIS — F329 Major depressive disorder, single episode, unspecified: Secondary | ICD-10-CM | POA: Diagnosis present

## 2019-04-20 DIAGNOSIS — G40909 Epilepsy, unspecified, not intractable, without status epilepticus: Secondary | ICD-10-CM | POA: Diagnosis present

## 2019-04-20 DIAGNOSIS — E162 Hypoglycemia, unspecified: Secondary | ICD-10-CM | POA: Diagnosis not present

## 2019-04-20 DIAGNOSIS — E785 Hyperlipidemia, unspecified: Secondary | ICD-10-CM | POA: Diagnosis not present

## 2019-04-20 DIAGNOSIS — T383X1A Poisoning by insulin and oral hypoglycemic [antidiabetic] drugs, accidental (unintentional), initial encounter: Principal | ICD-10-CM | POA: Diagnosis present

## 2019-04-20 DIAGNOSIS — Z6841 Body Mass Index (BMI) 40.0 and over, adult: Secondary | ICD-10-CM

## 2019-04-20 DIAGNOSIS — Z833 Family history of diabetes mellitus: Secondary | ICD-10-CM

## 2019-04-20 DIAGNOSIS — R531 Weakness: Secondary | ICD-10-CM | POA: Diagnosis not present

## 2019-04-20 DIAGNOSIS — E11649 Type 2 diabetes mellitus with hypoglycemia without coma: Secondary | ICD-10-CM | POA: Diagnosis not present

## 2019-04-20 DIAGNOSIS — K219 Gastro-esophageal reflux disease without esophagitis: Secondary | ICD-10-CM | POA: Diagnosis present

## 2019-04-20 DIAGNOSIS — Z20822 Contact with and (suspected) exposure to covid-19: Secondary | ICD-10-CM | POA: Diagnosis not present

## 2019-04-20 DIAGNOSIS — I110 Hypertensive heart disease with heart failure: Secondary | ICD-10-CM | POA: Diagnosis present

## 2019-04-20 DIAGNOSIS — R4182 Altered mental status, unspecified: Secondary | ICD-10-CM | POA: Diagnosis present

## 2019-04-20 HISTORY — DX: Unspecified cirrhosis of liver: K74.60

## 2019-04-20 HISTORY — DX: Polyneuropathy, unspecified: G62.9

## 2019-04-20 HISTORY — DX: Unspecified diastolic (congestive) heart failure: I50.30

## 2019-04-20 HISTORY — DX: Type 2 diabetes mellitus without complications: E11.9

## 2019-04-20 HISTORY — DX: Depression, unspecified: F32.A

## 2019-04-20 LAB — HEMOGLOBIN A1C
Hgb A1c MFr Bld: 8.6 % — ABNORMAL HIGH (ref 4.8–5.6)
Mean Plasma Glucose: 200.12 mg/dL

## 2019-04-20 LAB — CBC
HCT: 47 % — ABNORMAL HIGH (ref 36.0–46.0)
Hemoglobin: 15.2 g/dL — ABNORMAL HIGH (ref 12.0–15.0)
MCH: 31.9 pg (ref 26.0–34.0)
MCHC: 32.3 g/dL (ref 30.0–36.0)
MCV: 98.5 fL (ref 80.0–100.0)
Platelets: 176 10*3/uL (ref 150–400)
RBC: 4.77 MIL/uL (ref 3.87–5.11)
RDW: 12.7 % (ref 11.5–15.5)
WBC: 6.6 10*3/uL (ref 4.0–10.5)
nRBC: 0 % (ref 0.0–0.2)

## 2019-04-20 LAB — CBC WITH DIFFERENTIAL/PLATELET
Abs Immature Granulocytes: 0.02 10*3/uL (ref 0.00–0.07)
Basophils Absolute: 0 10*3/uL (ref 0.0–0.1)
Basophils Relative: 1 %
Eosinophils Absolute: 0 10*3/uL (ref 0.0–0.5)
Eosinophils Relative: 1 %
HCT: 48.3 % — ABNORMAL HIGH (ref 36.0–46.0)
Hemoglobin: 15.5 g/dL — ABNORMAL HIGH (ref 12.0–15.0)
Immature Granulocytes: 0 %
Lymphocytes Relative: 25 %
Lymphs Abs: 1.7 10*3/uL (ref 0.7–4.0)
MCH: 32 pg (ref 26.0–34.0)
MCHC: 32.1 g/dL (ref 30.0–36.0)
MCV: 99.8 fL (ref 80.0–100.0)
Monocytes Absolute: 0.6 10*3/uL (ref 0.1–1.0)
Monocytes Relative: 9 %
Neutro Abs: 4.3 10*3/uL (ref 1.7–7.7)
Neutrophils Relative %: 64 %
Platelets: 228 10*3/uL (ref 150–400)
RBC: 4.84 MIL/uL (ref 3.87–5.11)
RDW: 13 % (ref 11.5–15.5)
WBC: 6.7 10*3/uL (ref 4.0–10.5)
nRBC: 0 % (ref 0.0–0.2)

## 2019-04-20 LAB — CBG MONITORING, ED
Glucose-Capillary: 58 mg/dL — ABNORMAL LOW (ref 70–99)
Glucose-Capillary: 115 mg/dL — ABNORMAL HIGH (ref 70–99)
Glucose-Capillary: 69 mg/dL — ABNORMAL LOW (ref 70–99)
Glucose-Capillary: 61 mg/dL — ABNORMAL LOW (ref 70–99)
Glucose-Capillary: 63 mg/dL — ABNORMAL LOW (ref 70–99)
Glucose-Capillary: 55 mg/dL — ABNORMAL LOW (ref 70–99)
Glucose-Capillary: 84 mg/dL (ref 70–99)
Glucose-Capillary: 92 mg/dL (ref 70–99)
Glucose-Capillary: 79 mg/dL (ref 70–99)
Glucose-Capillary: 99 mg/dL (ref 70–99)
Glucose-Capillary: 102 mg/dL — ABNORMAL HIGH (ref 70–99)

## 2019-04-20 LAB — COMPREHENSIVE METABOLIC PANEL
ALT: 11 U/L (ref 0–44)
AST: 14 U/L — ABNORMAL LOW (ref 15–41)
Albumin: 3.6 g/dL (ref 3.5–5.0)
Alkaline Phosphatase: 107 U/L (ref 38–126)
Anion gap: 8 (ref 5–15)
BUN: 11 mg/dL (ref 8–23)
CO2: 29 mmol/L (ref 22–32)
Calcium: 9.1 mg/dL (ref 8.9–10.3)
Chloride: 100 mmol/L (ref 98–111)
Creatinine, Ser: 0.91 mg/dL (ref 0.44–1.00)
GFR calc Af Amer: >60 mL/min (ref >60)
GFR calc non Af Amer: >60 mL/min (ref >60)
Glucose, Bld: 52 mg/dL — ABNORMAL LOW (ref 70–99)
Potassium: 3.8 mmol/L (ref 3.5–5.1)
Sodium: 137 mmol/L (ref 135–145)
Total Bilirubin: 0.8 mg/dL (ref 0.3–1.2)
Total Protein: 7.6 g/dL (ref 6.5–8.1)

## 2019-04-20 LAB — URINALYSIS, ROUTINE W REFLEX MICROSCOPIC
Bilirubin Urine: NEGATIVE
Glucose, UA: NEGATIVE mg/dL
Hgb urine dipstick: NEGATIVE
Ketones, ur: NEGATIVE mg/dL
Leukocytes,Ua: NEGATIVE
Nitrite: NEGATIVE
Protein, ur: NEGATIVE mg/dL
Specific Gravity, Urine: 1.003 — ABNORMAL LOW (ref 1.005–1.030)
pH: 8 (ref 5.0–8.0)

## 2019-04-20 LAB — POC SARS CORONAVIRUS 2 AG -  ED: SARS Coronavirus 2 Ag: NEGATIVE

## 2019-04-20 LAB — RAPID URINE DRUG SCREEN, HOSP PERFORMED
Amphetamines: NOT DETECTED
Barbiturates: NOT DETECTED
Benzodiazepines: NOT DETECTED
Cocaine: NOT DETECTED
Opiates: NOT DETECTED
Tetrahydrocannabinol: NOT DETECTED

## 2019-04-20 LAB — AMMONIA: Ammonia: 37 umol/L — ABNORMAL HIGH (ref 9–35)

## 2019-04-20 LAB — CREATININE, SERUM
Creatinine, Ser: 0.76 mg/dL (ref 0.44–1.00)
GFR calc Af Amer: >60 mL/min (ref >60)
GFR calc non Af Amer: >60 mL/min (ref >60)

## 2019-04-20 LAB — PROTIME-INR
INR: 0.9 (ref 0.8–1.2)
Prothrombin Time: 12.4 seconds (ref 11.4–15.2)

## 2019-04-20 LAB — TROPONIN I (HIGH SENSITIVITY)
Troponin I (High Sensitivity): 4 ng/L (ref <18)
Troponin I (High Sensitivity): 6 ng/L (ref <18)

## 2019-04-20 LAB — SARS CORONAVIRUS 2 (TAT 6-24 HRS): SARS Coronavirus 2: NEGATIVE

## 2019-04-20 LAB — GLUCOSE, CAPILLARY: Glucose-Capillary: 104 mg/dL — ABNORMAL HIGH (ref 70–99)

## 2019-04-20 LAB — LACTIC ACID, PLASMA: Lactic Acid, Venous: 1.7 mmol/L (ref 0.5–1.9)

## 2019-04-20 MED ORDER — HYDROCHLOROTHIAZIDE 12.5 MG PO CAPS
12.5000 mg | ORAL_CAPSULE | Freq: Every day | ORAL | Status: DC
  Administered 2019-04-21 – 2019-04-24 (×4): 12.5 mg via ORAL
  Filled 2019-04-20 (×4): qty 1

## 2019-04-20 MED ORDER — POLYETHYLENE GLYCOL 3350 17 G PO PACK: 17.0000 g | PACK | Freq: Every day | ORAL | Status: DC | PRN

## 2019-04-20 MED ORDER — LEVETIRACETAM IN NACL 1000 MG/100ML IV SOLN
1000.0000 mg | Freq: Once | INTRAVENOUS | Status: AC
  Administered 2019-04-20: 1000 mg via INTRAVENOUS
  Filled 2019-04-20: qty 100

## 2019-04-20 MED ORDER — LORAZEPAM 2 MG/ML IJ SOLN: 1.0000 mg | Freq: Once | INTRAMUSCULAR | Status: DC

## 2019-04-20 MED ORDER — LEVETIRACETAM 500 MG PO TABS
500.0000 mg | ORAL_TABLET | Freq: Two times a day (BID) | ORAL | Status: DC
  Administered 2019-04-21 – 2019-04-24 (×7): 500 mg via ORAL
  Filled 2019-04-20 (×7): qty 1

## 2019-04-20 MED ORDER — PANTOPRAZOLE SODIUM 40 MG PO TBEC
40.0000 mg | DELAYED_RELEASE_TABLET | Freq: Every day | ORAL | Status: DC
  Administered 2019-04-21 – 2019-04-24 (×5): 40 mg via ORAL
  Filled 2019-04-20 (×5): qty 1

## 2019-04-20 MED ORDER — LORAZEPAM 2 MG/ML IJ SOLN
INTRAMUSCULAR | Status: AC
  Filled 2019-04-20: qty 1

## 2019-04-20 MED ORDER — DEXTROSE 50 % IV SOLN
INTRAVENOUS | Status: AC
  Filled 2019-04-20: qty 50

## 2019-04-20 MED ORDER — LOSARTAN POTASSIUM 50 MG PO TABS
100.0000 mg | ORAL_TABLET | Freq: Every day | ORAL | Status: DC
  Administered 2019-04-21 – 2019-04-24 (×4): 100 mg via ORAL
  Filled 2019-04-20 (×4): qty 2

## 2019-04-20 MED ORDER — ACETAMINOPHEN 650 MG RE SUPP: 650.0000 mg | Freq: Four times a day (QID) | RECTAL | Status: DC | PRN

## 2019-04-20 MED ORDER — DEXTROSE 10 % IV SOLN: INTRAVENOUS | Status: DC

## 2019-04-20 MED ORDER — FUROSEMIDE 20 MG PO TABS
20.0000 mg | ORAL_TABLET | Freq: Every day | ORAL | Status: DC
  Administered 2019-04-21 – 2019-04-23 (×3): 20 mg via ORAL
  Filled 2019-04-20 (×3): qty 1

## 2019-04-20 MED ORDER — LACTATED RINGERS IV BOLUS
1000.0000 mL | Freq: Once | INTRAVENOUS | Status: AC
  Administered 2019-04-20: 1000 mL via INTRAVENOUS

## 2019-04-20 MED ORDER — ENOXAPARIN SODIUM 40 MG/0.4ML ~~LOC~~ SOLN
40.0000 mg | SUBCUTANEOUS | Status: DC
  Administered 2019-04-20 – 2019-04-22 (×3): 40 mg via SUBCUTANEOUS
  Filled 2019-04-20 (×3): qty 0.4

## 2019-04-20 MED ORDER — BUPROPION HCL ER (XL) 150 MG PO TB24: 150.0000 mg | ORAL_TABLET | Freq: Every day | ORAL | Status: DC

## 2019-04-20 MED ORDER — FUROSEMIDE 10 MG/ML IJ SOLN
40.0000 mg | Freq: Once | INTRAMUSCULAR | Status: AC
  Administered 2019-04-20: 40 mg via INTRAVENOUS
  Filled 2019-04-20: qty 4

## 2019-04-20 MED ORDER — ACETAMINOPHEN 325 MG PO TABS
650.0000 mg | ORAL_TABLET | Freq: Four times a day (QID) | ORAL | Status: DC | PRN
  Administered 2019-04-21 – 2019-04-24 (×6): 650 mg via ORAL
  Filled 2019-04-20 (×6): qty 2

## 2019-04-20 NOTE — ED Notes (Signed)
ED TO INPATIENT HANDOFF REPORT  ED Nurse Name and Phone #: Catharina Pica [phone]  S Name/Age/Gender [name] 79 y.o. female Room/Bed: RESUSC/RESUSC  Code Status   Code Status: Full Code  Home/SNF/Other Home Patient oriented to: self, place and situation Is this baseline? Yes   Triage Complete: Triage complete  Chief Complaint AMS (altered mental status) [R41.82]  Triage Note Pt here from home via EMS, family called for decreased LOC, on EMS arrival totally unresponsive. CBG 38, D10 given with no improvement in mental status. Pt then had 20 sec tonic clonic seizure, resolved without intervention. No injury or fall. Pt somnolent on arrival to ED with NPA in place. Pt has been sick with vomiting and diarrhea for several days.     Allergies Allergies  Allergen Reactions  . Hydrocodone Hives  . Lisinopril Cough  . Pioglitazone Other (See Comments)    edema  . Varenicline Other (See Comments)    Bad dreams    Level of Care/Admitting Diagnosis ED Disposition    ED Disposition Condition [facility]: [place] [[id number]]  Level of Care: Progressive [102]  Admit to Progressive based on following criteria: ACUTE MENTAL DISORDER-RELATED Drug/Alcohol Ingestion/Overdose/Withdrawal, Suicidal Ideation/attempt requiring safety sitter and < Q2h monitoring/assessments, moderate to severe agitation that is managed with medication/sitter, CIWA-Ar score < 20.  Covid Evaluation: Confirmed COVID Negative  Date Laboratory Confirmed COVID Negative: 04/20/2019  Diagnosis: AMS (altered mental status) [[id number]]  Admitting Physician: Rory Percy [[id number]]  Attending Physician: Lind Covert [[id number]]  Estimated length of stay: past midnight tomorrow  Certification:: I certify this patient will need inpatient services for at least 2 midnights       B Medical/Surgery History No past medical history on file. Past Surgical History:  Procedure  Laterality Date  . ABDOMINAL HYSTERECTOMY  1980s  . APPENDECTOMY  2009     A IV Location/Drains/Wounds Patient Lines/Drains/Airways Status   Active Line/Drains/Airways    Name:   Placement date:   Placement time:   Site:   Days:   Peripheral IV 04/20/19 Right Hand   04/20/19    0935    Hand   less than 1   Peripheral IV 04/20/19 Right Antecubital   04/20/19    0935    Antecubital   less than 1   External Urinary Catheter   04/20/19    1500    --   less than 1          Intake/Output Last 24 hours  Intake/Output Summary (Last 24 hours) at 04/20/2019 2025 Last data filed at 04/20/2019 1404 Gross per 24 hour  Intake 2347.75 ml  Output 2000 ml  Net 347.75 ml    Labs/Imaging Results for orders placed or performed during the hospital encounter of 04/20/19 (from the past 48 hour(s))  CBG monitoring, ED     Status: Abnormal   Collection Time: 04/20/19  9:17 AM  Result Value Ref Range   Glucose-Capillary 58 (L) 70 - 99 mg/dL   Comment 1 Notify RN    Comment 2 Document in Chart   CBC with Differential     Status: Abnormal   Collection Time: 04/20/19  9:29 AM  Result Value Ref Range   WBC 6.7 4.0 - 10.5 K/uL   RBC 4.84 3.87 - 5.11 MIL/uL   Hemoglobin 15.5 (H) 12.0 - 15.0 g/dL   HCT 48.3 (H) 36.0 - 46.0 %   MCV 99.8 80.0 - 100.0 fL  MCH 32.0 26.0 - 34.0 pg   MCHC 32.1 30.0 - 36.0 g/dL   RDW 13.0 11.5 - 15.5 %   Platelets 228 150 - 400 K/uL   nRBC 0.0 0.0 - 0.2 %   Neutrophils Relative % 64 %   Neutro Abs 4.3 1.7 - 7.7 K/uL   Lymphocytes Relative 25 %   Lymphs Abs 1.7 0.7 - 4.0 K/uL   Monocytes Relative 9 %   Monocytes Absolute 0.6 0.1 - 1.0 K/uL   Eosinophils Relative 1 %   Eosinophils Absolute 0.0 0.0 - 0.5 K/uL   Basophils Relative 1 %   Basophils Absolute 0.0 0.0 - 0.1 K/uL   Immature Granulocytes 0 %   Abs Immature Granulocytes 0.02 0.00 - 0.07 K/uL    Comment: Performed at [facility] [address] [place], [place] [address]  Comprehensive metabolic panel      Status: Abnormal   Collection Time: 04/20/19  9:29 AM  Result Value Ref Range   Sodium 137 135 - 145 mmol/L   Potassium 3.8 3.5 - 5.1 mmol/L   Chloride 100 98 - 111 mmol/L   CO2 29 22 - 32 mmol/L   Glucose, Bld 52 (L) 70 - 99 mg/dL   BUN 11 8 - 23 mg/dL   Creatinine, Ser 0.91 0.44 - 1.00 mg/dL   Calcium 9.1 8.9 - 10.3 mg/dL   Total Protein 7.6 6.5 - 8.1 g/dL   Albumin 3.6 3.5 - 5.0 g/dL   AST 14 (L) 15 - 41 U/L   ALT 11 0 - 44 U/L   Alkaline Phosphatase 107 38 - 126 U/L   Total Bilirubin 0.8 0.3 - 1.2 mg/dL   GFR calc non Af Amer >60 >60 mL/min   GFR calc Af Amer >60 >60 mL/min   Anion gap 8 5 - 15    Comment: Performed at [facility] [address], [place], [place] [address]  Lactate     Status: None   Collection Time: 04/20/19  9:29 AM  Result Value Ref Range   Lactic Acid, Venous 1.7 0.5 - 1.9 mmol/L    Comment: Performed at [facility] [address] [place], [place] [address]  Troponin I (High Sensitivity)     Status: None   Collection Time: 04/20/19  9:29 AM  Result Value Ref Range   Troponin I (High Sensitivity) 4 <18 ng/L    Comment: (NOTE) Elevated high sensitivity troponin I (hsTnI) values and significant  changes across serial measurements may suggest ACS but many other  chronic and acute conditions are known to elevate hsTnI results.  Refer to the "Links" section for chest pain algorithms and additional  guidance. Performed at [facility], [place] [address] [place],  [address]   CBG monitoring, ED     Status: Abnormal   Collection Time: 04/20/19  9:32 AM  Result Value Ref Range   Glucose-Capillary 115 (H) 70 - 99 mg/dL  POC CBG, ED     Status: Abnormal   Collection Time: 04/20/19 11:23 AM  Result Value Ref Range   Glucose-Capillary 69 (L) 70 - 99 mg/dL   Comment 1 Notify RN    Comment 2 Document in Chart   CBG monitoring, ED     Status: Abnormal   Collection Time: 04/20/19 11:48 AM  Result Value Ref Range    Glucose-Capillary 61 (L) 70 - 99 mg/dL   Comment 1 Notify RN    Comment 2  Document in Chart   Urinalysis, Routine w reflex microscopic     Status: Abnormal   Collection Time: 04/20/19 12:01 PM  Result Value Ref Range   Color, Urine STRAW (A) YELLOW   APPearance CLEAR CLEAR   Specific Gravity, Urine 1.003 (L) 1.005 - 1.030   pH 8.0 5.0 - 8.0   Glucose, UA NEGATIVE NEGATIVE mg/dL   Hgb urine dipstick NEGATIVE NEGATIVE   Bilirubin Urine NEGATIVE NEGATIVE   Ketones, ur NEGATIVE NEGATIVE mg/dL   Protein, ur NEGATIVE NEGATIVE mg/dL   Nitrite NEGATIVE NEGATIVE   Leukocytes,Ua NEGATIVE NEGATIVE    Comment: Performed at [facility] [address], [place], [place] [address]  CBG monitoring, ED     Status: Abnormal   Collection Time: 04/20/19 12:26 PM  Result Value Ref Range   Glucose-Capillary 63 (L) 70 - 99 mg/dL   Comment 1 Notify RN    Comment 2 Document in Chart   POC SARS Coronavirus 2 Ag-ED - Nasal Swab (BD Veritor Kit)     Status: None   Collection Time: 04/20/19 12:33 PM  Result Value Ref Range   SARS Coronavirus 2 Ag NEGATIVE NEGATIVE    Comment: (NOTE) SARS-CoV-2 antigen NOT DETECTED.  Negative results are presumptive.  Negative results do not preclude SARS-CoV-2 infection and should not be used as the sole basis for treatment or other patient management decisions, including infection  control decisions, particularly in the presence of clinical signs and  symptoms consistent with COVID-19, or in those who have been in contact with the virus.  Negative results must be combined with clinical observations, patient history, and epidemiological information. The expected result is Negative. Fact Sheet for Patients: PodPark.tn Fact Sheet for Healthcare Providers: GiftContent.is This test is not yet approved or cleared by the [place] FDA and  has been authorized for detection and/or diagnosis of SARS-CoV-2  by FDA under an Emergency Use Authorization (EUA).  This EUA will remain in effect (meaning this test can be used) for the duration of  the COVID-19 de claration under Section 564(b)(1) of the Act, 21 U.S.C. section 360bbb-3(b)(1), unless the authorization is terminated or revoked sooner.   SARS CORONAVIRUS 2 (TAT 6-24 HRS) Nasopharyngeal Nasopharyngeal Swab     Status: None   Collection Time: 04/20/19  1:59 PM   Specimen: Nasopharyngeal Swab  Result Value Ref Range   SARS Coronavirus 2 NEGATIVE NEGATIVE    Comment: (NOTE) SARS-CoV-2 target nucleic acids are NOT DETECTED. The SARS-CoV-2 RNA is generally detectable in upper and lower respiratory specimens during the acute phase of infection. Negative results do not preclude SARS-CoV-2 infection, do not rule out co-infections with other pathogens, and should not be used as the sole basis for treatment or other patient management decisions. Negative results must be combined with clinical observations, patient history, and epidemiological information. The expected result is Negative. Fact Sheet for Patients: SugarRoll.be Fact Sheet for Healthcare Providers: https://www.woods-mathews.com/ This test is not yet approved or cleared by the [place] FDA and  has been authorized for detection and/or diagnosis of SARS-CoV-2 by FDA under an Emergency Use Authorization (EUA). This EUA will remain  in effect (meaning this test can be used) for the duration of the COVID-19 declaration under Section 56 4(b)(1) of the Act, 21 U.S.C. section 360bbb-3(b)(1), unless the authorization is terminated or revoked sooner. Performed at [facility], [place] [address], [place], [place] [address]   CBG monitoring, ED     Status: Abnormal  Collection Time: 04/20/19  2:02 PM  Result Value Ref Range   Glucose-Capillary 55 (L) 70 - 99 mg/dL  Urine rapid drug screen (hosp performed)     Status: None   Collection  Time: 04/20/19  2:32 PM  Result Value Ref Range   Opiates NONE DETECTED NONE DETECTED   Cocaine NONE DETECTED NONE DETECTED   Benzodiazepines NONE DETECTED NONE DETECTED   Amphetamines NONE DETECTED NONE DETECTED   Tetrahydrocannabinol NONE DETECTED NONE DETECTED   Barbiturates NONE DETECTED NONE DETECTED    Comment: (NOTE) DRUG SCREEN FOR MEDICAL PURPOSES ONLY.  IF CONFIRMATION IS NEEDED FOR ANY PURPOSE, NOTIFY LAB WITHIN 5 DAYS. LOWEST DETECTABLE LIMITS FOR URINE DRUG SCREEN Drug Class                     Cutoff (ng/mL) Amphetamine and metabolites    1000 Barbiturate and metabolites    200 Benzodiazepine                 427 Tricyclics and metabolites     300 Opiates and metabolites        300 Cocaine and metabolites        300 THC                            50 Performed at [facility], [place] [address], [place], [place] [address]   CBG monitoring, ED     Status: None   Collection Time: 04/20/19  3:31 PM  Result Value Ref Range   Glucose-Capillary 84 70 - 99 mg/dL  Ammonia     Status: Abnormal   Collection Time: 04/20/19  4:30 PM  Result Value Ref Range   Ammonia 37 (H) 9 - 35 umol/L    Comment: Performed at [facility], [place] [address], [place], [place] [address]  CBG monitoring, ED     Status: None   Collection Time: 04/20/19  4:36 PM  Result Value Ref Range   Glucose-Capillary 92 70 - 99 mg/dL  CBC     Status: Abnormal   Collection Time: 04/20/19  4:45 PM  Result Value Ref Range   WBC 6.6 4.0 - 10.5 K/uL   RBC 4.77 3.87 - 5.11 MIL/uL   Hemoglobin 15.2 (H) 12.0 - 15.0 g/dL   HCT 47.0 (H) 36.0 - 46.0 %   MCV 98.5 80.0 - 100.0 fL   MCH 31.9 26.0 - 34.0 pg   MCHC 32.3 30.0 - 36.0 g/dL   RDW 12.7 11.5 - 15.5 %   Platelets 176 150 - 400 K/uL   nRBC 0.0 0.0 - 0.2 %    Comment: Performed at [facility], [place] [address] [place], [place] [address]  Creatinine, serum     Status: None   Collection Time: 04/20/19  4:45 PM  Result Value Ref Range    Creatinine, Ser 0.76 0.44 - 1.00 mg/dL   GFR calc non Af Amer >60 >60 mL/min   GFR calc Af Amer >60 >60 mL/min    Comment: Performed at [facility] [address] [place], [place] [address]  Protime-INR     Status: None   Collection Time: 04/20/19  4:45 PM  Result Value Ref Range   Prothrombin Time 12.4 11.4 - 15.2 seconds   INR 0.9 0.8 - 1.2    Comment: (NOTE) INR goal varies based on device and disease states. Performed at [facility], [place] [address] [place], [place] [address]   Hemoglobin A1c     Status: Abnormal   Collection Time: 04/20/19  4:45 PM  Result Value Ref Range   Hgb A1c MFr Bld 8.6 (H) 4.8 - 5.6 %    Comment: (NOTE) Pre diabetes:          5.7%-6.4% Diabetes:              >6.4% Glycemic control for   <7.0% adults with diabetes    Mean Plasma Glucose 200.12 mg/dL    Comment: Performed at [facility] [address], [place], [place] [address]  Troponin I (High Sensitivity)     Status: None   Collection Time: 04/20/19  4:45 PM  Result Value Ref Range   Troponin I (High Sensitivity) 6 <18 ng/L    Comment: (NOTE) Elevated high sensitivity troponin I (hsTnI) values and significant  changes across serial measurements may suggest ACS but many other  chronic and acute conditions are known to elevate hsTnI results.  Refer to the "Links" section for chest pain algorithms and additional  guidance. Performed at [facility], [place] [address], [place], [place] [address]   CBG monitoring, ED     Status: None   Collection Time: 04/20/19  5:40 PM  Result Value Ref Range   Glucose-Capillary 79 70 - 99 mg/dL  CBG monitoring, ED     Status: None   Collection Time: 04/20/19  6:19 PM  Result Value Ref Range   Glucose-Capillary 99 70 - 99 mg/dL  CBG monitoring, ED     Status: Abnormal   Collection Time: 04/20/19  7:54 PM  Result Value Ref Range   Glucose-Capillary 102 (H) 70 - 99 mg/dL   CT HEAD WO CONTRAST  Result Date: 04/20/2019 CLINICAL  DATA:  Encephalopathy EXAM: CT HEAD WITHOUT CONTRAST TECHNIQUE: Contiguous axial images were obtained from the base of the skull through the vertex without intravenous contrast. COMPARISON:  CT head 06/26/2018 FINDINGS: Motion limited exam. Brain: No evidence of acute infarction, hemorrhage, hydrocephalus, extra-axial collection or mass lesion/mass effect. Vascular: No hyperdense vessel or unexpected calcification. Skull: Normal. Negative for fracture or focal lesion. Sinuses/Orbits: No acute finding. Other: None. IMPRESSION: Motion limited exam. No definite acute intracranial abnormality. Electronically Signed   By: Audie Pinto M.D.   On: 04/20/2019 18:44   DG Chest Port 1 View  Result Date: 04/20/2019 CLINICAL DATA:  Altered mental status EXAM: PORTABLE CHEST 1 VIEW COMPARISON:  None. FINDINGS: Heart size is mildly enlarged. Mild pulmonary vascular congestion. Increased perihilar and bibasilar interstitial opacities, right slightly greater than left. No pleural effusion or pneumothorax. IMPRESSION: 1. Mild cardiomegaly with mild pulmonary vascular congestion. 2. Increased perihilar and bibasilar interstitial opacities, right greater than left, could reflect interstitial edema or atypical infection. Electronically Signed   By: Davina Poke D.O.   On: 04/20/2019 10:02    Pending Labs Unresulted Labs (From admission, onward)    Start     Ordered   04/27/19 0500  Creatinine, serum  (enoxaparin (LOVENOX)    CrCl >/= 30 ml/min)  Weekly,   R    Comments: while on enoxaparin therapy    04/20/19 1607   04/21/19 0500  Comprehensive metabolic panel  Tomorrow morning,   R     04/20/19 1607   04/21/19 0500  CBC  Tomorrow morning,   R     04/20/19 1607  Vitals/Pain Today's Vitals   04/20/19 1500 04/20/19 1530 04/20/19 1630 04/20/19 1830  BP: (!) 166/152 (!) 157/110 124/90 122/77  Pulse: 63 (!) 57 (!) 54 73  Resp: 13 (!) '21 19 16  '$ Temp:      TempSrc:      SpO2: 97% 96% 94% 97%   PainSc:        Isolation Precautions No active isolations  Medications Medications  LORazepam (ATIVAN) 2 MG/ML injection (  Not Given 04/20/19 1410)  dextrose 10 % infusion ( Intravenous Rate/Dose Change 04/20/19 1152)  enoxaparin (LOVENOX) injection 40 mg (40 mg Subcutaneous Given 04/20/19 1820)  polyethylene glycol (MIRALAX / GLYCOLAX) packet 17 g (has no administration in time range)  acetaminophen (TYLENOL) tablet 650 mg (has no administration in time range)    Or  acetaminophen (TYLENOL) suppository 650 mg (has no administration in time range)  losartan (COZAAR) tablet 100 mg (100 mg Oral Not Given 04/20/19 1848)  hydrochlorothiazide (MICROZIDE) capsule 12.5 mg (12.5 mg Oral Not Given 04/20/19 1849)  pantoprazole (PROTONIX) EC tablet 40 mg (40 mg Oral Not Given 04/20/19 1850)  levETIRAcetam (KEPPRA) tablet 500 mg (has no administration in time range)  furosemide (LASIX) tablet 20 mg (has no administration in time range)  LORazepam (ATIVAN) injection 1 mg (has no administration in time range)  dextrose 50 % solution (  Given 04/20/19 0920)  lactated ringers bolus 1,000 mL (0 mLs Intravenous Stopped 04/20/19 1201)  levETIRAcetam (KEPPRA) IVPB 1000 mg/100 mL premix (0 mg Intravenous Stopped 04/20/19 1015)  furosemide (LASIX) injection 40 mg (40 mg Intravenous Given 04/20/19 1226)    Mobility walks High fall risk   Focused Assessments Neuro Assessment Handoff:  Swallow screen pass? n/a         Neuro Assessment: Within Defined Limits Neuro Checks:      Last Documented NIHSS Modified Score:   Has TPA been given? No  If patient is a Neuro Trauma and patient is going to OR before floor call report to [facility] nurse: [phone] or [phone]     R Recommendations: See Admitting Provider Note  Report given to:   Additional Notes: Pt in MRI at this time

## 2019-04-20 NOTE — ED Notes (Signed)
Admitting doctor has been called to speak with the son who is at the bedside  She will be right down to talk with him

## 2019-04-20 NOTE — ED Notes (Signed)
Son at  The bedside  pts bed wet  Changed by staff

## 2019-04-20 NOTE — ED Notes (Signed)
Pt given 8 oz orange juice and apple sauce. Awake and alert.

## 2019-04-20 NOTE — ED Notes (Signed)
Pt just returned from c-t 

## 2019-04-20 NOTE — ED Provider Notes (Signed)
[facility] EMERGENCY DEPARTMENT Provider Note   CSN: [id number] Arrival date & time: 04/20/19  [id number]     History Chief Complaint  Patient presents with  . Altered Mental Status  . Hypoglycemia    [name] is a 79 y.o. female.  Patient is a 80 year old female being brought in today with by paramedics for altered mental status.  Paramedics do report that patient has a history of seizure and for the last week she has had vomiting and diarrhea but has not sought medical care.  This morning family reports they were talking to her and she seemed fine but then she became unresponsive.  She was not waking up so they called 911.  When paramedics arrived patient's blood sugar was 38.  They gave her D50 with some improvement of mental status but she was not back to baseline and was having intermittent episodes of unresponsiveness.  Then EMS witnessed a 1 minute episode of shaking consistent with seizure that they did not intervene upon.  Since that time she has been unresponsive.  Vital signs have been stable.  She did have repeat blood sugar that showed decreasing glucose again despite the initial increase with D50.  No further history available at this time.  The history is provided by the EMS personnel. The history is limited by the condition of the patient and the absence of a caregiver.  Altered Mental Status Presenting symptoms: unresponsiveness   Severity:  Severe Most recent episode:  Today Episode history:  Multiple Timing:  Intermittent Progression:  Unchanged Chronicity:  New Hypoglycemia Associated symptoms: altered mental status        No past medical history on file.  There are no problems to display for this patient.      OB History   No obstetric history on file.     No family history on file.  Social History   Tobacco Use  . Smoking status: Not on file  Substance Use Topics  . Alcohol use: Not on file  . Drug use: Not on file     Home Medications Prior to Admission medications   Not on File    Allergies    Patient has no known allergies.  Review of Systems   Review of Systems  Unable to perform ROS: Acuity of condition    Physical Exam Updated Vital Signs BP (!) 149/68   Pulse 71   Resp 18   SpO2 93%   Physical Exam Constitutional:      General: She is not in acute distress.    Appearance: She is well-developed. She is obese.     Comments: Patient is somnolent but does have some purposeful movement when attempting to open her eyes and mouth she resists  HENT:     Head: Normocephalic and atraumatic.     Right Ear: External ear normal.     Left Ear: External ear normal.     Mouth/Throat:     Mouth: Mucous membranes are moist.     Comments: Unable to see if there are bite marks on the tongue as patient is not completely following commands. Eyes:     General:        Right eye: No discharge.     Conjunctiva/sclera: Conjunctivae normal.     Pupils: Pupils are equal, round, and reactive to light.  Cardiovascular:     Rate and Rhythm: Normal rate and regular rhythm.     Heart sounds: Normal heart sounds. No murmur.  Pulmonary:     Effort: Tachypnea present. No respiratory distress.     Breath sounds: No decreased breath sounds, wheezing, rhonchi or rales.  Abdominal:     Palpations: Abdomen is soft.     Tenderness: There is no abdominal tenderness.  Musculoskeletal:        General: No tenderness. Normal range of motion.     Cervical back: Normal range of motion and neck supple.  Skin:    General: Skin is warm and dry.     Findings: No rash.  Neurological:     Comments: Patient initially is not responsive to voice but is responsive to pain.  After receiving D50 patient opens her eyes and is able to tell me her full name but she is not sure what happened     ED Results / Procedures / Treatments   Labs (all labs ordered are listed, but only abnormal results are displayed) Labs Reviewed   CBC WITH DIFFERENTIAL/PLATELET - Abnormal; Notable for the following components:      Result Value   Hemoglobin 15.5 (*)    HCT 48.3 (*)    All other components within normal limits  COMPREHENSIVE METABOLIC PANEL - Abnormal; Notable for the following components:   Glucose, Bld 52 (*)    AST 14 (*)    All other components within normal limits  CBG MONITORING, ED - Abnormal; Notable for the following components:   Glucose-Capillary 58 (*)    All other components within normal limits  CBG MONITORING, ED - Abnormal; Notable for the following components:   Glucose-Capillary 115 (*)    All other components within normal limits  CBG MONITORING, ED - Abnormal; Notable for the following components:   Glucose-Capillary 69 (*)    All other components within normal limits  CBG MONITORING, ED - Abnormal; Notable for the following components:   Glucose-Capillary 61 (*)    All other components within normal limits  LACTIC ACID, PLASMA  URINALYSIS, ROUTINE W REFLEX MICROSCOPIC  POC SARS CORONAVIRUS 2 AG -  ED  TROPONIN I (HIGH SENSITIVITY)    EKG EKG Interpretation  Date/Time:  Friday April 20 2019 09:20:47 EST Ventricular Rate:  70 PR Interval:    QRS Duration: 121 QT Interval:  433 QTC Calculation: 468 R Axis:   41 Text Interpretation: Sinus rhythm IVCD, consider atypical LBBB No previous tracing Confirmed by Blanchie Dessert [id number]) on 04/20/2019 9:51:58 AM   Radiology DG Chest Port 1 View  Result Date: 04/20/2019 CLINICAL DATA:  Altered mental status EXAM: PORTABLE CHEST 1 VIEW COMPARISON:  None. FINDINGS: Heart size is mildly enlarged. Mild pulmonary vascular congestion. Increased perihilar and bibasilar interstitial opacities, right slightly greater than left. No pleural effusion or pneumothorax. IMPRESSION: 1. Mild cardiomegaly with mild pulmonary vascular congestion. 2. Increased perihilar and bibasilar interstitial opacities, right greater than left, could reflect interstitial  edema or atypical infection. Electronically Signed   By: Davina Poke D.O.   On: 04/20/2019 10:02    Procedures Procedures (including critical care time)  Medications Ordered in ED Medications  LORazepam (ATIVAN) 2 MG/ML injection (has no administration in time range)  levETIRAcetam (KEPPRA) IVPB 1000 mg/100 mL premix (1,000 mg Intravenous New Bag/Given 04/20/19 0947)  dextrose 10 % infusion ( Intravenous New Bag/Given 04/20/19 0947)  dextrose 50 % solution (  Given 04/20/19 0920)  lactated ringers bolus 1,000 mL (1,000 mLs Intravenous New Bag/Given 04/20/19 0946)    ED Course  I have reviewed the triage vital signs and the  nursing notes.  Pertinent labs & imaging results that were available during my care of the patient were reviewed by me and considered in my medical decision making (see chart for details).    MDM Rules/Calculators/A&P                      Elderly female arriving with paramedics for altered mental status and hypoglycemia.  Patient sugar was corrected prior to arrival with intermittent periods of lucidness but then also visualized seizure.  Upon arrival here patient has a nasal trumpet placed and will resist when attempting to open her eyes or her mouth but is not speaking.  Repeat blood sugar here is 58 and she was given another amp of D50.  Patient then opened her eyes and started to look around and seemed confused.  Shortly after she was able to tell me her full name.  She then had a second seizure that was full body shaking and eye deviation to the right.  This lasted approximately 1 minute with a short postictal period.  Patient did not have any hypoxia during this event.  Vital signs have been normal.  She was loaded with 1 g of Keppra is currently we have no records of her or what medication she takes.  There was a report that she has had a week of vomiting and diarrhea.  Concern for AKI, electrolyte abnormality.  Patient started on LR and will continue a D10 drip at 125.   Will check blood sugar frequently.  Labs, chest x-ray EKG pending  11:52 AM Patient is sleeping but easily arousable and able to give a full history now.  She states she does have a history of seizures but is not 100% sure what she takes for it.  She denies headache but states over the last week or 2 she has had vomiting and diarrhea and intermittent chest pain.  She denies having any current chest pain, recent shortness of breath or cough.  However patient continues to be hypoglycemic and is currently on D10 at 125 but repeat sugar is 69.  It was increased to 135/h.  CBC with no acute findings, CMP shows normal renal function and electrolytes.  Lactic acid is within normal limits.  Chest x-ray shows mild cardiomegaly with some mild pulmonary vascular congestion and increased interstitial opacities which could be edema versus atypical infection.  Patient does have some coarse breath sounds on repeat evaluation will give 1 dose of Lasix.  When asking her more about her cardiac history she states she does have a prior history of stents.  Unable to get any information from the medical record on this patient.  We will also speak with her family member.  Given seizures, persistent hypoglycemia will plan on admission.  12:01 PM Spoke with patient's daughter who confirms that patient has been having nausea vomiting and diarrhea.  She lives with her son and his wife but she stays in and only goes out wearing a mask.  Nobody in the home has been sick.  She does take a diuretic regularly and daughter states she has been taking her medicines consistently.  However daughter does not have record of her medications because they gave it to a home health nurse who is never returned it.  Given patient's chest x-ray findings will give a dose of IV Lasix as she has to continue on D10.  Covid is also pending.  CRITICAL CARE Performed by: Korbyn Chopin Total critical care time: 65  minutes Critical care time was exclusive  of separately billable procedures and treating other patients. Critical care was necessary to treat or prevent imminent or life-threatening deterioration. Critical care was time spent personally by me on the following activities: development of treatment plan with patient and/or surrogate as well as nursing, discussions with consultants, evaluation of patient's response to treatment, examination of patient, obtaining history from patient or surrogate, ordering and performing treatments and interventions, ordering and review of laboratory studies, ordering and review of radiographic studies, pulse oximetry and re-evaluation of patient's condition.  Final Clinical Impression(s) / ED Diagnoses Final diagnoses:  Hypoglycemia  Seizure ([place])  Transient alteration of awareness    Rx / DC Orders ED Discharge Orders    None       Blanchie Dessert, MD 04/20/19 1202

## 2019-04-20 NOTE — H&P (Signed)
[facility] Admission History and Physical Service Pager: [phone]  Patient name: [name] Medical record number: [id number] Date of birth: 03/23/1941 Age: 79 y.o. Gender: female  Primary Care Provider: Patient, No Pcp Per Consultants: none Code Status: Full (confirm when more awake) Preferred Emergency Contact: daughter Lona Kettle, [phone]  Of note, selected for chart merge.  [name] MRN: [id number]  Chief Complaint: AMS  Assessment and Plan: [name] is a 79 y.o. female presenting with her mental status and seizure-like activity. 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  Likely 2/2 hypoglycemia given improvement with titration of D10 drip.  Does have history of stroke with minimal right-sided hemiparesis though no focal deficits on exam to suggest new stroke.  Certainly could also be continued postictal phase from seizure with EMS, is now loaded with Keppra.  Does also have history of hepatic cirrhosis per chart review LFTs are within normal limits and only mild hepatomegaly seen on previous CT A/P.  Electrolytes within normal limits.  Will check ammonia.  UDS pending.  No vital sign instability, lab abnormalities, or findings on physical exam to suggest infection. -Admit to progressive, attending Dr. Erin Hearing  - Continue D10 drip, CBGs q2h -Ammonia level -Brain MRI -UDS -PT/OT  H/o seizures On gabapentin currently but was previously on keppra after hospital admission in 2011 with outpatient neuro w/u with EEG and MRI per chart review.  Tonic-clonic seizure-like activity witnessed with EMS, s/p Keppra loading. -Continue p.o. Keppra -Hold home  gabapentin due to somnolence, restart once more awake -Outpatient neuro follow-up -Brain MRI -Consider EEG and/or neuro consult if return of seizure activity or if mental status declines  IDT2DM, h/o severe hypoglycemia On Lantus 100u daily and humalog 70u with supper confirmed with outpatient pharmacy. Was recently started on ozempic per endocrinology, pharmacy confirms patient picked up in November however daughter reports patient has not picked this up or started this medication. Per endo note, patient has h/o severe hypoglycemia with last episode in 2011.  Last A1c 9.4 on 01/19/2019. -Hold home insulin -Continue D10 drip, CBGs every 2 hours -A1c  HFpEF ECHO 2017 with LVEF 60-65% and G1DD. On lasix 20mg  daily at home.  Did receive 1 dose IV Lasix in ED for apparent volume overload and vascular congestion seen on CXR.  Minimal LE edema at time of my exam. -Continue home Lasix  H/o stroke In 2006 with resultant R sided hemiparesis, improved with PT per daughter.  No focal findings on exam today.  Hepatic cirrhosis Noted per chart revew from 2008. CT A/P 2019 with mild hepatomegaly. LFTs wnl. - ammonia level  Chest pain H/o intermittent sharp and burning sternal chest pain for the past 2 years. Reproducible on exam.  Initial troponin negative.  EKG sinus rhythm without ST changes noted. -Follow-up repeat troponin -A.m. EKG  GERD -Continue home omeprazole 40mg   Depression -Hold home mirtazapine -Continue home bupropion  HLD Previously on statin per chart review though none seen past 2017 which may be related to documented hepatic cirrhosis, will defer to PCP Dr. Madalyn Rob with [facility].  FEN/GI: N.p.o. Prophylaxis: Lovenox  Disposition: Admit to progressive, attending Dr. Erin Hearing  History of Present Illness:  [name] is a 79 y.o. female presenting with altered mental status with seizure earlier this morning.  Daughter states patient's fiance reported her blood  sugar was high this morning around 3am to around 299 and she subsequently gave herself insulin but fiance is unsure how much she administered. Around 7-7:30am, her fiance called her daughter noting confusion, slurred speech, increased thirst with subsequent seizure-like acitivity with generalized shaking. EMS was then called. On transport, a tonic-clonic seizure was noted that self-resolved per ED report.   Daughter (who lives next door) notes for the past week, patient has had increased slurred speech, urinary incontinence, non-bloody vomiting and diarrhea. Also reports fluctuating CBGs for the past few months, 60 to over 500s. Lowest was in the 60s last week. Daughter denies sick contacts, fevers. Denies med changes in the last few weeks but does not know what medications she is on. Had medication list but provided to [facility] nurse and hasn't received it back yet. Thinks she takes 100u lantus daily with 70-80u of humalog with dinner depending on nightly CBGs.    Review Of Systems: Per HPI with the following additions:   Review of Systems  Constitutional: Positive for malaise/fatigue. Negative for fever.  Respiratory: Positive for cough. Negative for hemoptysis, sputum production and shortness of breath.   Cardiovascular: Positive for chest pain.  Gastrointestinal: Positive for diarrhea, nausea and vomiting. Negative for abdominal pain and blood in stool.  Genitourinary:       Urinary incontinence  Neurological: Positive for seizures.    Patient Active Problem List   Diagnosis Date Noted  . AMS (altered mental status) 04/20/2019    Past Medical History: No past medical history on file.  Past Surgical History: Past Surgical History:  Procedure Laterality Date  . ABDOMINAL HYSTERECTOMY  1980s  . APPENDECTOMY  2009    Social History: Social History   Tobacco Use  . Smoking status: Not on file  Substance Use Topics  . Alcohol use: Not on file  . Drug use: Not on file   Additional  social history:   Please also refer to relevant sections of EMR.  Family History: No family history on file. Mother - ovarian cancer, diabetes, colon cancer, heart disease Father - heart disease Sister-heart disease Sister-clotting disorder MGM-heart disease  Allergies and Medications: No Known Allergies No current facility-administered medications on file prior to encounter.   No current outpatient medications on file prior to encounter.    Objective: BP (!) 144/97 (BP Location: Right Arm)   Pulse 76   Temp (!) 97.4 F (36.3 C) (Oral)   Resp (!) 22   SpO2 93%  Exam: General: Morbidly obese female, lying in bed, somnolent Eyes: EOMI, constricted pupils ENTM: MMM, dentures in place Neck: obese neck Cardiovascular: RRR, no murmur Respiratory: CTAB on RA, upper airway noises transmitted Gastrointestinal: obese abdomen, soft, nonTTP MSK: Good tone and bulk Derm: No appreciable rashes or lesions or wounds Neuro: Alert and oriented x4, somnolent but awakens with verbal stimulation  Labs and Imaging: CBC BMET  Recent Labs  Lab 04/20/19 0929  WBC 6.7  HGB 15.5*  HCT 48.3*  PLT 228   Recent Labs  Lab 04/20/19 0929  NA 137  K 3.8  CL 100  CO2 29  BUN 11  CREATININE 0.91  GLUCOSE 52*  CALCIUM 9.1     EKG: Sinus rhythm  DG Chest Port 1 View  Result Date: 04/20/2019 CLINICAL DATA:  Altered mental status EXAM: PORTABLE CHEST 1 VIEW COMPARISON:  None. FINDINGS: Heart size is mildly enlarged. Mild pulmonary vascular congestion. Increased perihilar and bibasilar interstitial opacities,  right slightly greater than left. No pleural effusion or pneumothorax. IMPRESSION: 1. Mild cardiomegaly with mild pulmonary vascular congestion. 2. Increased perihilar and bibasilar interstitial opacities, right greater than left, could reflect interstitial edema or atypical infection. Electronically Signed   By: Davina Poke D.O.   On: 04/20/2019 10:02   Rory Percy,  DO 04/20/2019, 4:10 PM PGY-3, [facility] pager: [phone], text pages welcome

## 2019-04-20 NOTE — ED Notes (Signed)
PAGED ADMITTING PER RN  

## 2019-04-20 NOTE — ED Triage Notes (Signed)
Pt here from home via EMS, family called for decreased LOC, on EMS arrival totally unresponsive. CBG 38, D10 given with no improvement in mental status. Pt then had 20 sec tonic clonic seizure, resolved without intervention. No injury or fall. Pt somnolent on arrival to ED with NPA in place. Pt has been sick with vomiting and diarrhea for several days.

## 2019-04-20 NOTE — ED Notes (Signed)
Daughter, Ezequiel Ganser, called for pt update- [phone]

## 2019-04-21 ENCOUNTER — Encounter (HOSPITAL_COMMUNITY): Payer: Self-pay | Admitting: Family Medicine

## 2019-04-21 DIAGNOSIS — R569 Unspecified convulsions: Secondary | ICD-10-CM

## 2019-04-21 LAB — COMPREHENSIVE METABOLIC PANEL
ALT: 8 U/L (ref 0–44)
AST: 13 U/L — ABNORMAL LOW (ref 15–41)
Albumin: 2.9 g/dL — ABNORMAL LOW (ref 3.5–5.0)
Alkaline Phosphatase: 90 U/L (ref 38–126)
Anion gap: 11 (ref 5–15)
BUN: 7 mg/dL — ABNORMAL LOW (ref 8–23)
CO2: 30 mmol/L (ref 22–32)
Calcium: 8.8 mg/dL — ABNORMAL LOW (ref 8.9–10.3)
Chloride: 97 mmol/L — ABNORMAL LOW (ref 98–111)
Creatinine, Ser: 0.77 mg/dL (ref 0.44–1.00)
GFR calc Af Amer: >60 mL/min (ref >60)
GFR calc non Af Amer: >60 mL/min (ref >60)
Glucose, Bld: 162 mg/dL — ABNORMAL HIGH (ref 70–99)
Potassium: 3.7 mmol/L (ref 3.5–5.1)
Sodium: 138 mmol/L (ref 135–145)
Total Bilirubin: 1.1 mg/dL (ref 0.3–1.2)
Total Protein: 6 g/dL — ABNORMAL LOW (ref 6.5–8.1)

## 2019-04-21 LAB — GLUCOSE, CAPILLARY
Glucose-Capillary: 140 mg/dL — ABNORMAL HIGH (ref 70–99)
Glucose-Capillary: 170 mg/dL — ABNORMAL HIGH (ref 70–99)
Glucose-Capillary: 175 mg/dL — ABNORMAL HIGH (ref 70–99)
Glucose-Capillary: 178 mg/dL — ABNORMAL HIGH (ref 70–99)
Glucose-Capillary: 194 mg/dL — ABNORMAL HIGH (ref 70–99)
Glucose-Capillary: 205 mg/dL — ABNORMAL HIGH (ref 70–99)
Glucose-Capillary: 214 mg/dL — ABNORMAL HIGH (ref 70–99)
Glucose-Capillary: 228 mg/dL — ABNORMAL HIGH (ref 70–99)
Glucose-Capillary: 280 mg/dL — ABNORMAL HIGH (ref 70–99)

## 2019-04-21 LAB — CBC
HCT: 44.7 % (ref 36.0–46.0)
Hemoglobin: 14.6 g/dL (ref 12.0–15.0)
MCH: 31.3 pg (ref 26.0–34.0)
MCHC: 32.7 g/dL (ref 30.0–36.0)
MCV: 95.9 fL (ref 80.0–100.0)
Platelets: 205 10*3/uL (ref 150–400)
RBC: 4.66 MIL/uL (ref 3.87–5.11)
RDW: 12.8 % (ref 11.5–15.5)
WBC: 7.5 10*3/uL (ref 4.0–10.5)
nRBC: 0 % (ref 0.0–0.2)

## 2019-04-21 LAB — MRSA PCR SCREENING: MRSA by PCR: NEGATIVE

## 2019-04-21 MED ORDER — SENNA 8.6 MG PO TABS
1.0000 | ORAL_TABLET | Freq: Every day | ORAL | Status: DC
  Administered 2019-04-21 – 2019-04-24 (×4): 8.6 mg via ORAL
  Filled 2019-04-21 (×4): qty 1

## 2019-04-21 MED ORDER — INSULIN ASPART 100 UNIT/ML ~~LOC~~ SOLN
0.0000 [IU] | Freq: Three times a day (TID) | SUBCUTANEOUS | Status: DC
  Administered 2019-04-21: 3 [IU] via SUBCUTANEOUS
  Administered 2019-04-21 – 2019-04-22 (×2): 5 [IU] via SUBCUTANEOUS
  Administered 2019-04-22: 2 [IU] via SUBCUTANEOUS
  Administered 2019-04-22 – 2019-04-23 (×4): 3 [IU] via SUBCUTANEOUS
  Administered 2019-04-24: 2 [IU] via SUBCUTANEOUS
  Administered 2019-04-24: 3 [IU] via SUBCUTANEOUS

## 2019-04-21 MED ORDER — POLYETHYLENE GLYCOL 3350 17 G PO PACK
17.0000 g | PACK | Freq: Every day | ORAL | Status: DC
  Administered 2019-04-21 – 2019-04-24 (×4): 17 g via ORAL
  Filled 2019-04-21 (×4): qty 1

## 2019-04-21 NOTE — Progress Notes (Signed)
Family Medicine Teaching Service Daily Progress Note Intern Pager: [phone]  Patient name: [name] Medical record number: [id number] Date of birth: 1941-03-21 Age: 79 y.o. Gender: female  Primary Care Provider: Patient, No Pcp Per Consultants: None Code Status: Full   Of note, selected for chart merge.  [name] MRN: [id number]  Pt Overview and Major Events to Date:  04/20/2019-admitted  Assessment and Plan: [name] is a 79 y.o. female presenting with altered mental status and seizure-like activity. PMH is significant for insulin-dependent type 2 diabetes with history of severe hypoglycemia and neuropathy, HFpEF, stroke, ?hepatic cirrhosis, HTN, HLD, depression  Altered mental status: Patient initially admitted with confusion and slurred speech, this improved to full orientation with a D10 drip, that patient remained somnolent after this.  Ammonia level mildly elevated at 37.  Brain MRI showed no acute intracranial finding, mild chronic small vessel ischemic changes of the pons and cerebral hemispheric white matter considering age. Urine drug screen negative. -Discontinue D10 -Continue to monitor CBGs -Moderate sliding scale -PT/OT  History of severe hypoglycemia, T2DM A1c 04/20/2019-8.6.  Patient on Lantus 100 units daily and Humalog 70 units with dinner, confirmed with outpatient pharmacy.  Was recently started on his Ozempic per endocrinology which pharmacy confirms patient did pick up.  Per Endo note patient does have a history of severe hypoglycemia with last episode in 2011.  Last A1c 9.4 on 01/19/2019. -Hold home insulin -Continue D10 drip, CBGs every 2 hours  History of seizures: Home meds include gabapentin.  Patient was previously on [place] in 2011.  EMS apparently witnessed tonic clonic seizure-like activity.  Patient status post Keppra loading -Continue oral Keppra -Hold home gabapentin, can restart once patient is more  awake -Consider EEG and/or neuro consult if return of seizure-like activity or if mental status declines  Constipation, left lower quadrant abdominal pain Patient states she does not believe she has had a bowel movement for 1-2 weeks.  She states she normally does not have an issue with constipation.  States she has left lower quadrant pain.  Patient with bowel sounds present, patient has passed gas earlier today and stated that it did make her stomach pain feel better.  Patient denies have any history of diverticulosis, diverticulitis. -Start MiraLAX and senna -Can progress to enema if needed  HFpEF Echo 2017 with left ventricle ejection fraction [id number], grade 1 diastolic dysfunction.  On Lasix 20 mg as home med. -Continue home Lasix  History of stroke 2006 with resultant right-sided hemiparesis, improved with PT per patient's daughter.    Hepatic cirrhosis Ammonia level mildly elevated at 37.  Hepatic cirrhosis noted in chart review from 2008.  CT abdomen pelvis in 2019 with mild hepatomegaly.  LFTs within normal limits  Chest pain Morning EKG shows sinus rhythm with sinus arrhythmia. Initial troponin 4, repeat 6.  History of intermittent sharp and burning substernal chest pain for the past 2 years which have been reproducible on exam.    GERD -Continue home omeprazole 40 mg  Depression -Hold home mirtazapine -Continue home bupropion  Hyperlipidemia Previously on statin per chart review though none seen past 2017 possibly related to documented hepatic cirrhosis.  Will defer to PCP Dr[facility] Madalyn Rob [facility].  FEN/GI: N.p.o. PPx: Left  Disposition: Admit to progressive  Subjective:  Patient's primary complaint this morning involves left lower quadrant abdominal pain.  States she has not had a bowel movement in approximately 1-2 weeks.  She states she is normally not have an issue with constipation.  Patient states she did pass gas earlier today and that improved her abdominal  pain temporarily.  Patient denies pains in other areas of her abdomen. Patient without other complaints at this time.  Objective: Temp:  [97.4 F (36.3 C)-98.6 F (37 C)] 98.3 F (36.8 C) (01/02 0346) Pulse Rate:  [54-76] 76 (01/02 0346) Resp:  [13-22] 20 (01/02 0346) BP: (122-185)/(68-152) 141/86 (01/02 0346) SpO2:  [92 %-97 %] 92 % (01/02 0346) Weight:  [149.7 kg] 149.7 kg (01/01 2156) Physical Exam: General: Alert and oriented, some complaints of left lower quadrant discomfort. Heart: S1, S2 present with no murmurs appreciated. Lungs: CTA bilaterally Abdomen: Bowel sounds present, left lower quadrant abdominal discomfort to palpation present, no tenderness to palpation in other quadrants.  Laboratory: Recent Labs  Lab 04/20/19 0929 04/20/19 1645 04/21/19 0217  WBC 6.7 6.6 7.5  HGB 15.5* 15.2* 14.6  HCT 48.3* 47.0* 44.7  PLT 228 176 205   Recent Labs  Lab 04/20/19 0929 04/20/19 1645 04/21/19 0217  NA 137  --  138  K 3.8  --  3.7  CL 100  --  97*  CO2 29  --  30  BUN 11  --  7*  CREATININE 0.91 0.76 0.77  CALCIUM 9.1  --  8.8*  PROT 7.6  --  6.0*  BILITOT 0.8  --  1.1  ALKPHOS 107  --  90  ALT 11  --  8  AST 14*  --  13*  GLUCOSE 52*  --  162*      Imaging/Diagnostic Tests: CT HEAD WO CONTRAST  Result Date: 04/20/2019 CLINICAL DATA:  Encephalopathy EXAM: CT HEAD WITHOUT CONTRAST TECHNIQUE: Contiguous axial images were obtained from the base of the skull through the vertex without intravenous contrast. COMPARISON:  CT head 06/26/2018 FINDINGS: Motion limited exam. Brain: No evidence of acute infarction, hemorrhage, hydrocephalus, extra-axial collection or mass lesion/mass effect. Vascular: No hyperdense vessel or unexpected calcification. Skull: Normal. Negative for fracture or focal lesion. Sinuses/Orbits: No acute finding. Other: None. IMPRESSION: Motion limited exam. No definite acute intracranial abnormality. Electronically Signed   By: Audie Pinto M.D.    On: 04/20/2019 18:44   MR BRAIN WO CONTRAST  Result Date: 04/20/2019 CLINICAL DATA:  Encephalopathy. Mental status changes with seizure like activity. EXAM: MRI HEAD WITHOUT CONTRAST TECHNIQUE: Multiplanar, multiecho pulse sequences of the brain and surrounding structures were obtained without intravenous contrast. COMPARISON:  Head CT earlier same day. FINDINGS: Brain: Diffusion imaging does not show any acute or subacute infarction or other cause of restricted diffusion. No abnormality is seen affecting the cerebellum. Mild chronic small-vessel changes present within the pons. Cerebral hemispheres show mild chronic small-vessel change of the white matter. No cortical or large vessel territory infarction. No evidence of mass lesion, hemorrhage, hydrocephalus or extra-axial collection. Vascular: Major vessels at the base of the brain show flow. Skull and upper cervical spine: Negative Sinuses/Orbits: Clear/normal Other: Incidental arachnoid herniation into the sella. At this age, this is likely an incidental finding. IMPRESSION: No acute intracranial finding. Mild chronic small-vessel ischemic change of the pons and cerebral hemispheric white matter considering age. Electronically Signed   By: Nelson Chimes M.D.   On: 04/20/2019 20:51   DG Chest Port 1 View  Result Date: 04/20/2019 CLINICAL DATA:  Altered mental status EXAM: PORTABLE CHEST 1 VIEW COMPARISON:  None. FINDINGS: Heart size is mildly enlarged. Mild pulmonary vascular congestion. Increased perihilar and bibasilar interstitial opacities, right slightly greater than left. No pleural effusion or  pneumothorax. IMPRESSION: 1. Mild cardiomegaly with mild pulmonary vascular congestion. 2. Increased perihilar and bibasilar interstitial opacities, right greater than left, could reflect interstitial edema or atypical infection. Electronically Signed   By: Davina Poke D.O.   On: 04/20/2019 10:02    Lurline Del, DO 04/21/2019, 6:03 AM PGY-1, [place] Intern pager: [phone], text pages welcome

## 2019-04-21 NOTE — Evaluation (Addendum)
Occupational Therapy Evaluation Patient Details Name: [name] MRN: [id number] DOB: 10/24/1940 Today's Date: 04/21/2019    History of Present Illness 79 y.o. female presenting with AMS and seizure-like activity. MRI showing no acute changes. PMH is significant for insulin-dependent type 2 diabetes with history of severe hypoglycemia and neuropathy, HFpEF, stroke, ?hepatic cirrhosis, HTN, HLD, and depression.    Clinical Impression   PTA, pt reports that her daughter, "friend", and PCA alternate to provide support; aide assists with BADLs and IADLs. Pt reports she uses her "roller" (despited like an Transport planner) for mobility. Pt currently presenting with decreased arousal, cognition, balance, and activity tolerance. Pt requiring Min A for UB ADLs, Max A for LB ADLs, and Min-Max A for toileting. Pt requiring Min A for stand pivot to [facility]. Pt would benefit from further acute OT to facilitate safe dc. Recommend dc to home with HHOT for further OT to optimize safety, independence with ADLs, and return to PLOF.      Follow Up Recommendations  Home health OT;Supervision/Assistance - 24 hour    Equipment Recommendations  None recommended by OT    Recommendations for Other Services PT consult     Precautions / Restrictions Precautions Precautions: Fall      Mobility Bed Mobility Overal bed mobility: Needs Assistance Bed Mobility: Supine to Sit     Supine to sit: Min guard;HOB elevated     General bed mobility comments: Min Guard A for safety. Cues for use of bed rail. No physical A needed  Transfers Overall transfer level: Needs assistance Equipment used: None Transfers: Sit to/from [facility] Sit to Stand: Min guard Stand pivot transfers: Min guard;Min assist       General transfer comment: Min Guard A for safety. Min A to guide hips towards transfer surface safely    Balance Overall balance assessment: Needs assistance Sitting-balance  support: No upper extremity supported;Feet supported Sitting balance-Leahy Scale: Fair     Standing balance support: Bilateral upper extremity supported;During functional activity Standing balance-Leahy Scale: Poor Standing balance comment: Reliant on UE support                           ADL either performed or assessed with clinical judgement   ADL Overall ADL's : Needs assistance/impaired Eating/Feeding: Set up;Sitting   Grooming: Set up;Sitting;Supervision/safety   Upper Body Bathing: Minimal assistance;Sitting   Lower Body Bathing: Maximal assistance;Sit to/from stand   Upper Body Dressing : Minimal assistance;Sitting   Lower Body Dressing: Maximal assistance;Sit to/from stand   Toilet Transfer: Stand-pivot;BSC;+2 for safety/equipment;Minimal assistance;Min Psychiatric nurse Details (indicate cue type and reason): Min A for guiding hips towards BSC Toileting- Clothing Manipulation and Hygiene: +2 for physical assistance;Maximal assistance;Sit to/from stand Toileting - Clothing Manipulation Details (indicate cue type and reason): Pt requiring right knee to be blocked for semi-stand and Max A fro peri care     Functional mobility during ADLs: Minimal assistance;Min guard(stand pivot) General ADL Comments: Pt presenting with decreased cognition and activity tolerance     Vision         Perception     Praxis      Pertinent Vitals/Pain Pain Assessment: Faces Faces Pain Scale: Hurts little more Pain Location: "My tummy" Pain Descriptors / Indicators: Cramping;Discomfort;Pressure Pain Intervention(s): Monitored during session;Repositioned     Hand Dominance     Extremity/Trunk Assessment Upper Extremity Assessment Upper Extremity Assessment: Generalized weakness   Lower Extremity Assessment Lower Extremity Assessment: Defer  to PT evaluation   Cervical / Trunk Assessment Cervical / Trunk Assessment: Other exceptions(Increased bodu habitus)    Communication Communication Communication: No difficulties   Cognition Arousal/Alertness: Awake/alert Behavior During Therapy: WFL for tasks assessed/performed Overall Cognitive Status: Within Functional Limits for tasks assessed                                     General Comments  VSS throughout on RA    Exercises     Shoulder Instructions      Home Living Family/patient expects to be discharged to:: Private residence Living Arrangements: Alone(Daughter lives next door) Available Help at Discharge: Family;Available 24 hours/day;Friend(s);Personal care attendant(Daughter, PCA, and "female friend" alterante) Type of Home: Apartment             Bathroom Shower/Tub: (Sponge bathes)   Bathroom Toilet: Standard     Home Equipment: Walker - 2 wheels;Electric scooter;Bedside commode          Prior Functioning/Environment Level of Independence: Needs assistance  Gait / Transfers Assistance Needed: Pt reports she uses an scooter for mobility around her home ADL's / Homemaking Assistance Needed: Aide to assist with ADLs and IADLs.   Comments: Unsure of reliability due to decreased arousal        OT Problem List: Decreased strength;Decreased range of motion;Decreased activity tolerance;Impaired balance (sitting and/or standing);Decreased knowledge of use of DME or AE;Decreased knowledge of precautions      OT Treatment/Interventions: Self-care/ADL training;Therapeutic exercise;Energy conservation;DME and/or AE instruction;Therapeutic activities;Patient/family education    OT Goals(Current goals can be found in the care plan section) Acute Rehab OT Goals Patient Stated Goal: "Get better and go home" OT Goal Formulation: With patient Time For Goal Achievement: 05/05/19 Potential to Achieve Goals: Good  OT Frequency: Min 2X/week   Barriers to D/C:            Co-evaluation PT/OT/SLP Co-Evaluation/Treatment: Yes Reason for Co-Treatment: For  patient/therapist safety;To address functional/ADL transfers   OT goals addressed during session: ADL's and self-care      AM-PAC OT "6 Clicks" Daily Activity     Outcome Measure Help from another person eating meals?: None Help from another person taking care of personal grooming?: A Little Help from another person toileting, which includes using toliet, bedpan, or urinal?: A Lot Help from another person bathing (including washing, rinsing, drying)?: A Lot Help from another person to put on and taking off regular upper body clothing?: A Little Help from another person to put on and taking off regular lower body clothing?: A Lot 6 Click Score: 16   End of Session Nurse Communication: Mobility status  Activity Tolerance: Patient tolerated treatment well Patient left: in chair;with call bell/phone within reach;with chair alarm set  OT Visit Diagnosis: Unsteadiness on feet (R26.81);Other abnormalities of gait and mobility (R26.89);Muscle weakness (generalized) (M62.81)                Time: [id number] OT Time Calculation (min): 22 min Charges:  OT General Charges $OT Visit: 1 Visit OT Evaluation $OT Eval Moderate Complexity: [place], OTR/L Acute Rehab Pager: [phone] Office: [place] 04/21/2019, 1:37 PM

## 2019-04-21 NOTE — Plan of Care (Signed)

## 2019-04-21 NOTE — Discharge Summary (Addendum)
[facility] Discharge Summary  Patient name: [place] Medical record number: [id number] Date of birth: Dec 26, 1940 Age: 79 y.o. Gender: female Date of Admission: 04/20/2019  Date of Discharge: 04/24/19  Admitting Physician: Rory Percy, DO  Primary Care Provider: Patient, No Pcp Per Consultants: None  Indication for Hospitalization: Altered mental status  Discharge Diagnoses/Problem List:  Type 2 diabetes HFpEF History of stroke History of seizures Hepatic cirrhosis Chest pain GERD Depression Hyperlipidemia  Disposition: Home  Discharge Condition: Improved   Discharge Exam:  GEN: resting comfortably in bed, well developed female CV: regular rate and rhythm, no murmurs appreciated  RESP: no increased work of breathing, clear to ascultation bilaterally with no crackles, wheezes, or rhonchi  ABD: Bowel sounds present. Soft, Nontender, Nondistended. Obese abdomen. MSK: no lower extremity edema, or cyanosis noted SKIN: warm, dry NEURO: alert and oriented x4, grossly normal, moves all extremities appropriately PSYCH: Normal affect and thought content   Brief Hospital Course:  Altered mental status/hypoglycemia/type 2 diabetes  Patient was admitted with altered mental status after apparently taking an unknown dose of her insulin the day prior (patient has poor medication literacy, do not believe this was an intended act of self-harm). Patient did have a witnessed seizure during transport via EMS to the emergency department.  In the emergency department, the patient received a negative head CT as well as a negative brain MRI. Glucose 52 upon arrival. Patient was started on a D10 drip with CBGs every 2 hours to monitor blood glucose levels.  This seemed to improve her altered mental status.  Patient was also started on a loading dose of Keppra followed by Keppra daily. Previous a1c 9.4 (01/2019) and during admission a1c 8.6. Patient was  discharged with 10 units of Lantus with close follow up with her endocrinologist.  Reasonable CBG average at DC.  Her short acting insulin and Ozempic were held on discharge.    History of seizures Seizure-like activity EMS reported tonic clonic-like activity and a second event was witnessed by the ED physician. Patient was previously on [place] after seizure activity in 2011.  Keppra was started via loading dose and then continued during this hospitalization due to patient seizure in route to the emergency department. Patient was discharged with Keppra.  Suspect seizure on admission was related to hypoglycemia.  Issues for Follow Up:  1. Patient has follow up with Endocrinology on 04/27/19. Patient's insulin requirement during admission was substantially less than reported home insulin regimen. Recommend reviewing diabetes regimen with patient following her endocrinology appointment.  2. Patient is on two diuretics (lasix + HCTZ), discontinued Lasix 20mg  during hospital stay. Euvolemic on exam. Please adjust as necessary. Titrate HCTZ up as needed.  3. Gabapentin was held on admission. Restart as appropriate. 4. Patient needs [facility] follow-up for observed seizure-like activity and further titration of Keppra.  A referral was placed at discharge.   5. Consider a new depression screen as patient's eating habits have changed and labs were grossly normal.   Significant Procedures: None   Significant Labs and Imaging:  Recent Labs  Lab 04/21/19 0217 04/22/19 1214 04/23/19 0546  WBC 7.5 4.9 5.7  HGB 14.6 14.1 14.3  HCT 44.7 43.2 44.6  PLT 205 205 184   Recent Labs  Lab 04/20/19 0929 04/20/19 1645 04/21/19 0217 04/22/19 1214 04/23/19 0546  NA 137  --  138 137 137  K 3.8  --  3.7 4.1 3.9  CL 100  --  97* 101 96*  CO2 29  --  30 28 31   GLUCOSE 52*  --  162* 216* 186*  BUN 11  --  7* 11 15  CREATININE 0.91 0.76 0.77 1.03* 1.09*  CALCIUM 9.1  --  8.8* 8.8* 9.1  ALKPHOS 107  --   90  --   --   AST 14*  --  13*  --   --   ALT 11  --  8  --   --   ALBUMIN 3.6  --  2.9*  --   --     CT HEAD WO CONTRAST  Result Date: 04/20/2019 CLINICAL DATA:  Encephalopathy EXAM: CT HEAD WITHOUT CONTRAST TECHNIQUE: Contiguous axial images were obtained from the base of the skull through the vertex without intravenous contrast. COMPARISON:  CT head 06/26/2018 FINDINGS: Motion limited exam. Brain: No evidence of acute infarction, hemorrhage, hydrocephalus, extra-axial collection or mass lesion/mass effect. Vascular: No hyperdense vessel or unexpected calcification. Skull: Normal. Negative for fracture or focal lesion. Sinuses/Orbits: No acute finding. Other: None. IMPRESSION: Motion limited exam. No definite acute intracranial abnormality. Electronically Signed   By: Audie Pinto M.D.   On: 04/20/2019 18:44   MR BRAIN WO CONTRAST  Result Date: 04/20/2019 CLINICAL DATA:  Encephalopathy. Mental status changes with seizure like activity. EXAM: MRI HEAD WITHOUT CONTRAST TECHNIQUE: Multiplanar, multiecho pulse sequences of the brain and surrounding structures were obtained without intravenous contrast. COMPARISON:  Head CT earlier same day. FINDINGS: Brain: Diffusion imaging does not show any acute or subacute infarction or other cause of restricted diffusion. No abnormality is seen affecting the cerebellum. Mild chronic small-vessel changes present within the pons. Cerebral hemispheres show mild chronic small-vessel change of the white matter. No cortical or large vessel territory infarction. No evidence of mass lesion, hemorrhage, hydrocephalus or extra-axial collection. Vascular: Major vessels at the base of the brain show flow. Skull and upper cervical spine: Negative Sinuses/Orbits: Clear/normal Other: Incidental arachnoid herniation into the sella. At this age, this is likely an incidental finding. IMPRESSION: No acute intracranial finding. Mild chronic small-vessel ischemic change of the pons  and cerebral hemispheric white matter considering age. Electronically Signed   By: Nelson Chimes M.D.   On: 04/20/2019 20:51     Results/Tests Pending at Time of Discharge: None   Discharge Medications:  Allergies as of 04/24/2019       Reactions   Hydrocodone Hives   Lisinopril Cough   Pioglitazone Other (See Comments)   edema   Varenicline Other (See Comments)   Bad dreams        Medication List     STOP taking these medications    buPROPion 150 MG 12 hr tablet Commonly known as: WELLBUTRIN SR   furosemide 20 MG tablet Commonly known as: LASIX   gabapentin 300 MG capsule Commonly known as: NEURONTIN   HumaLOG KwikPen 100 UNIT/ML KwikPen Generic drug: insulin lispro   mirtazapine 15 MG tablet Commonly known as: REMERON   Ozempic (0.25 or 0.5 MG/DOSE) 2 MG/1.5ML Sopn Generic drug: Semaglutide(0.25 or 0.5MG /DOS)       TAKE these medications    atorvastatin 40 MG tablet Commonly known as: Lipitor Take 1 tablet (40 mg total) by mouth daily.   insulin glargine 100 unit/mL Sopn Commonly known as: LANTUS Inject 0.1 mLs (10 Units total) into the skin daily. What changed: how much to take   levETIRAcetam 500 MG tablet Commonly known as: KEPPRA Take 1 tablet (500 mg total) by mouth 2 (two) times daily.  losartan-hydrochlorothiazide 100-12.5 MG tablet Commonly known as: HYZAAR Take 1 tablet by mouth daily.   omeprazole 40 MG capsule Commonly known as: PRILOSEC Take 40 mg by mouth daily.        Discharge Instructions: Please refer to Patient Instructions section of EMR for full details.  Patient was counseled important signs and symptoms that should prompt return to medical care, changes in medications, dietary instructions, activity restrictions, and follow up appointments.   Follow-Up Appointments: Follow-up Information     [facility] Follow up.   Specialty: Home Health Services Why: home ehatlh Contact information: [place] [address] [phone]         Renato Shin, MD. Go on 04/27/2019.   Specialty: Endocrinology Why: at 12:45 pm  Contact information: [address] [place] [address] [phone]         [place] Schedule an appointment as soon as possible for a visit.   Contact information: [place], [place], DO PGY-1, [facility] 04/24/2019 2:41 PM    FPTS Upper-Level Resident Addendum   I have discussed the above with the original author and agree with their documentation. My edits for correction/addition/clarification are in green. Please see also any attending notes.    Patriciaann Clan, DO  Family Medicine PGY-2

## 2019-04-21 NOTE — Evaluation (Signed)
Physical Therapy Evaluation Patient Details Name: [name] MRN: [id number] DOB: 09-Apr-1941 Today's Date: 04/21/2019   History of Present Illness  79 y.o. female presenting with AMS and seizure-like activity. MRI showing no acute changes. PMH is significant for insulin-dependent type 2 diabetes with history of severe hypoglycemia and neuropathy, HFpEF, stroke, ?hepatic cirrhosis, HTN, HLD, and depression.  Clinical Impression  Pt presents to PT with deficits in functional mobility, balance, strength, power, and endurance. Pt may be an unreliable historian due to conflicting reports provided to PT/OT and RN. Pt is groggy throughout session, having difficulty maintaining eyes open at times. Pt will benefit from continued acute PT services to improve transfer quality and reduce falls risk. Pt will also benefit from gait training if it is found that the pt does ambulate some at baseline. PT recommending home with HHPT and continued 24/7 assistance.    Follow Up Recommendations Home health PT;Supervision/Assistance - 24 hour    Equipment Recommendations  None recommended by PT(pt owns necessary DME)    Recommendations for Other Services       Precautions / Restrictions Precautions Precautions: Fall Restrictions Weight Bearing Restrictions: No      Mobility  Bed Mobility Overal bed mobility: Needs Assistance Bed Mobility: Supine to Sit     Supine to sit: Min guard;HOB elevated     General bed mobility comments: Min Guard A for safety. Cues for use of bed rail. No physical A needed  Transfers Overall transfer level: Needs assistance Equipment used: None Transfers: Stand Pivot Transfers Sit to Stand: Min guard Stand pivot transfers: Min guard       General transfer comment: pt with significant increase in trunk flexion, utilizing BUE support of recliner or BSC to turn ans sit  Ambulation/Gait Ambulation/Gait assistance: (pt reports utilizing scooter for all OOB  mobility)              Stairs            Wheelchair Mobility    Modified Rankin (Stroke Patients Only)       Balance Overall balance assessment: Needs assistance Sitting-balance support: No upper extremity supported;Feet supported Sitting balance-Leahy Scale: Good Sitting balance - Comments: supervision   Standing balance support: Bilateral upper extremity supported Standing balance-Leahy Scale: Poor Standing balance comment: minA-minG, reliant on BUE support                             Pertinent Vitals/Pain Pain Assessment: Faces Faces Pain Scale: Hurts even more Pain Location: stomach Pain Descriptors / Indicators: Cramping;Discomfort;Pressure Pain Intervention(s): Limited activity within patient's tolerance    Home Living Family/patient expects to be discharged to:: Private residence Living Arrangements: Alone(dtr lives close) Available Help at Discharge: Family;Personal care attendant;Friend(s);Available 24 hours/day(daughter, care attendant 5-7 days/wk for 7-8 hours) Type of Home: Apartment Home Access: Level entry     Home Layout: One level Home Equipment: Walker - 2 wheels;Electric scooter;Bedside commode      Prior Function Level of Independence: Needs assistance   Gait / Transfers Assistance Needed: Pt reports she uses an scooter for mobility around her home  ADL's / Homemaking Assistance Needed: Aide to assist with ADLs and IADLs.  Comments: history may be unreliable 2/2 AMS. Pt told RN she walks with a walker at home     Hand Dominance        Extremity/Trunk Assessment   Upper Extremity Assessment Upper Extremity Assessment: Generalized weakness    Lower Extremity  Assessment Lower Extremity Assessment: Generalized weakness    Cervical / Trunk Assessment Cervical / Trunk Assessment: (increased body habitus)  Communication   Communication: No difficulties  Cognition Arousal/Alertness: Awake/alert Behavior During  Therapy: WFL for tasks assessed/performed Overall Cognitive Status: Within Functional Limits for tasks assessed                                        General Comments General comments (skin integrity, edema, etc.): VSS, RA    Exercises     Assessment/Plan    PT Assessment Patient needs continued PT services  PT Problem List Decreased strength;Decreased activity tolerance;Decreased balance;Decreased mobility;Decreased knowledge of use of DME;Decreased safety awareness       PT Treatment Interventions DME instruction;Functional mobility training;Therapeutic activities;Therapeutic exercise;Balance training;Neuromuscular re-education;Patient/family education    PT Goals (Current goals can be found in the Care Plan section)  Acute Rehab PT Goals Patient Stated Goal: To go home PT Goal Formulation: With patient Time For Goal Achievement: 05/05/19 Potential to Achieve Goals: Good    Frequency Min 3X/week   Barriers to discharge        Co-evaluation PT/OT/SLP Co-Evaluation/Treatment: Yes Reason for Co-Treatment: For patient/therapist safety;To address functional/ADL transfers PT goals addressed during session: Mobility/safety with mobility;Balance OT goals addressed during session: ADL's and self-care       AM-PAC PT "6 Clicks" Mobility  Outcome Measure Help needed turning from your back to your side while in a flat bed without using bedrails?: A Little Help needed moving from lying on your back to sitting on the side of a flat bed without using bedrails?: A Little Help needed moving to and from a bed to a chair (including a wheelchair)?: A Little Help needed standing up from a chair using your arms (e.g., wheelchair or bedside chair)?: A Little Help needed to walk in hospital room?: A Lot Help needed climbing 3-5 steps with a railing? : A Lot 6 Click Score: 16    End of Session Equipment Utilized During Treatment: (none) Activity Tolerance: Patient  tolerated treatment well Patient left: in chair;with call bell/phone within reach;with chair alarm set Nurse Communication: Mobility status PT Visit Diagnosis: Muscle weakness (generalized) (M62.81)    Time: 1140-1201 PT Time Calculation (min) (ACUTE ONLY): 21 min   Charges:   PT Evaluation $PT Eval Moderate Complexity: 1 Mod          Zenaida Niece, PT, DPT Acute Rehabilitation Pager: [phone]   Zenaida Niece 04/21/2019, 2:07 PM

## 2019-04-22 ENCOUNTER — Encounter (HOSPITAL_COMMUNITY): Payer: Self-pay | Admitting: Family Medicine

## 2019-04-22 LAB — CBC
HCT: 43.2 % (ref 36.0–46.0)
Hemoglobin: 14.1 g/dL (ref 12.0–15.0)
MCH: 31.7 pg (ref 26.0–34.0)
MCHC: 32.6 g/dL (ref 30.0–36.0)
MCV: 97.1 fL (ref 80.0–100.0)
Platelets: 205 K/uL (ref 150–400)
RBC: 4.45 MIL/uL (ref 3.87–5.11)
RDW: 12.8 % (ref 11.5–15.5)
WBC: 4.9 K/uL (ref 4.0–10.5)
nRBC: 0 % (ref 0.0–0.2)

## 2019-04-22 LAB — GLUCOSE, CAPILLARY
Glucose-Capillary: 169 mg/dL — ABNORMAL HIGH (ref 70–99)
Glucose-Capillary: 221 mg/dL — ABNORMAL HIGH (ref 70–99)
Glucose-Capillary: 134 mg/dL — ABNORMAL HIGH (ref 70–99)
Glucose-Capillary: 264 mg/dL — ABNORMAL HIGH (ref 70–99)

## 2019-04-22 LAB — BASIC METABOLIC PANEL
Anion gap: 8 (ref 5–15)
BUN: 11 mg/dL (ref 8–23)
CO2: 28 mmol/L (ref 22–32)
Calcium: 8.8 mg/dL — ABNORMAL LOW (ref 8.9–10.3)
Chloride: 101 mmol/L (ref 98–111)
Creatinine, Ser: 1.03 mg/dL — ABNORMAL HIGH (ref 0.44–1.00)
GFR calc Af Amer: >60 mL/min (ref >60)
GFR calc non Af Amer: 52 mL/min — ABNORMAL LOW (ref >60)
Glucose, Bld: 216 mg/dL — ABNORMAL HIGH (ref 70–99)
Potassium: 4.1 mmol/L (ref 3.5–5.1)
Sodium: 137 mmol/L (ref 135–145)

## 2019-04-22 MED ORDER — INSULIN GLARGINE 100 UNIT/ML ~~LOC~~ SOLN
10.0000 [IU] | Freq: Every day | SUBCUTANEOUS | Status: DC
  Administered 2019-04-22 – 2019-04-23 (×2): 10 [IU] via SUBCUTANEOUS
  Filled 2019-04-22 (×3): qty 0.1

## 2019-04-22 NOTE — TOC Initial Note (Signed)
Transition of Care [facility]) - Initial/Assessment Note    Patient Details  Name: [name] MRN: [id number] Date of Birth: 1941/01/26  Transition of [facility]) CM/SW Contact:    Carles Collet, RN Phone Number: 04/22/2019, 9:37 AM  Clinical Narrative:        Damaris Schooner w patient's daughter Lona Kettle [phone]. She informs me that patient lives at home alone, she lives across the street, and would like patient to return home at DC. Patient has home aids through [facility].  She has a RW, and an electric WC and a standard WC. She also gets mobile meals delivered.  She has coverage through [facility] [place] [facility] [place]. She goes to Dr Tamala Julian at [facility] (on [address])  Need to verify insurance coverage.             Expected Discharge Plan: [place] Barriers to Discharge: Continued Medical Work up   Patient Goals and CMS Choice Patient states their goals for this hospitalization and ongoing recovery are:: to go home CMS Medicare.gov Compare Post Acute Care list provided to:: Other (Comment Required) Choice offered to / list presented to : Adult Children  Expected Discharge Plan and Services Expected Discharge Plan: [place]   Discharge Planning Services: CM Consult Post Acute Care Choice: [place] arrangements for the past 2 months: Single Family Home                                      Prior Living Arrangements/Services Living arrangements for the past 2 months: Single Family Home Lives with:: Self              Current home services: Homehealth aide    Activities of Daily Living Home Assistive Devices/Equipment: Environmental consultant (specify type) ADL Screening (condition at time of admission) Patient's cognitive ability adequate to safely complete daily activities?: No Is the patient deaf or have difficulty hearing?: No Does the patient have difficulty seeing, even when wearing glasses/contacts?: No Does the  patient have difficulty concentrating, remembering, or making decisions?: Yes Patient able to express need for assistance with ADLs?: Yes Does the patient have difficulty dressing or bathing?: Yes Independently performs ADLs?: No Does the patient have difficulty walking or climbing stairs?: Yes Weakness of Legs: Both Weakness of Arms/Hands: None  Permission Sought/Granted                  Emotional Assessment              Admission diagnosis:  Transient alteration of awareness [R40.4] Seizure ([place]) [R56.9] Hypoglycemia [E16.2] AMS (altered mental status) [R41.82] Patient Active Problem List   Diagnosis Date Noted  . Seizure ([place])   . AMS (altered mental status) 04/20/2019  . Hypoglycemia    PCP:  Patient, No Pcp Per Pharmacy:   [facility] [place], [address] [place] [address] Phone: [phone] Fax: [phone]     Social Determinants of Health (SDOH) Interventions    Readmission Risk Interventions No flowsheet data found.

## 2019-04-22 NOTE — Progress Notes (Addendum)
Family Medicine Teaching Service Daily Progress Note Intern Pager: [phone]  Patient name: [name] Medical record number: [id number] Date of birth: 21-Jun-1940 Age: 79 y.o. Gender: female  Primary Care Provider: Patient, No Pcp Per Consultants: None Code Status: Full   Of note, selected for chart merge.  Kaithlyn Besa MRN: [id number]  Pt Overview and Major Events to Date:  04/20/2019-admitted  Assessment and Plan: [name] is a 79 y.o. female presenting with altered mental status and seizure-like activity. PMH is significant for insulin-dependent type 2 diabetes with history of severe hypoglycemia and neuropathy, HFpEF, stroke, ?hepatic cirrhosis, HTN, HLD, depression  Altered mental status: Acute, Resolved. Patient appears to have poor understanding of how to manage her diabetes and her AMS was likely secondary from hypoglycemia brought on by too much insulin use. Brain MRI showed no acute intracranial finding, mild chronic small vessel ischemic changes of the pons and cerebral hemispheric white matter considering age. Urine drug screen negative. -Continue to monitor CBGs -Moderate sliding scale -PT/OT recommending 24hour care; patient agrees to go to SNF but per Social Work note daughter [name] wants her to return home with [facility] services as she lives across the street.  History of severe hypoglycemia, T2DM A1c 04/20/2019-8.6. CBGs have been increasing since stopping D10 drip. Highest was 280, last check his am was 169. Patient received 8U of Aspart in past 24 hours. Patient on Lantus 100 units daily and Humalog 70 units with dinner, confirmed with outpatient pharmacy.  Was recently started on Ozempic per endocrinology which pharmacy confirms patient did pick up.  Per Endo note patient does have a history of severe hypoglycemia with last episode in 2011.  Last A1c 9.4 on 01/19/2019. - cont CBGs - cont mSSI - Restarting low dose 10U Lantus tonight  History  of seizures: Home meds include gabapentin.  Patient was previously on [place] in 2011.  EMS apparently witnessed tonic clonic seizure-like activity.  Patient status post Keppra loading - Cont Keppra - Cont to hold gabapentin but can restart if patient requests - F/u outpatient Neurology   Constipation, left lower quadrant abdominal pain Patient had BM last night, pain resolved. She states she normally does not have an issue with constipation. Patient with bowel sounds present, patient has passed gas earlier today and stated that it did make her stomach pain feel better.  Patient denies have any history of diverticulosis, diverticulitis. - Cont MiraLAX and senna  HFpEF Echo 2017 with left ventricle ejection fraction 123456, grade 1 diastolic dysfunction.  On Lasix 20 mg as home med. -Continue home Lasix 20mg  -Cont home Cozaar 100mg   HTN Chronic, stable. SBP range of 87-150 and DBP of 65-112 in past 24 hours. Last BP check was 150/65. On home Cozaar, HCTZ, and Lasix - Cont HCTZ 12.5mg  - Cont Lasix 20mg  - Cont Cozaar 100mg   History of stroke 2006 with resultant right-sided hemiparesis, improved with PT per patient's daughter.    Hepatic cirrhosis Ammonia level mildly elevated at 37.  Hepatic cirrhosis noted in chart review from 2008.  CT abdomen pelvis in 2019 with mild hepatomegaly.  LFTs within normal limits - Repeat LFTs outpatient   GERD -Continue home omeprazole 40 mg  Depression -Hold home mirtazapine -Continue home bupropion  Hyperlipidemia Previously on statin per chart review though none seen past 2017 possibly related to documented hepatic cirrhosis.  Will defer to PCP Dr. Madalyn Rob with [facility].  FEN/GI: N.p.o. PPx: Left  Disposition: Admit to progressive  Subjective:  Patient this am is more  alert and can answer all questions appropriately. She has no complaints and is sitting up eating breakfast comfortably. She is agreeable to going to SNF.  Objective: Temp:   [98.3 F (36.8 C)-99.1 F (37.3 C)] 98.3 F (36.8 C) (01/03 0802) Pulse Rate:  [63-77] 63 (01/03 0802) Resp:  [14-20] 15 (01/03 0802) BP: (87-150)/(65-112) 150/65 (01/03 0802) SpO2:  [93 %-98 %] 97 % (01/03 0802) Physical Exam: Gen: Alert and Oriented x 3, NAD CV: RRR, no murmurs, normal S1, S2 split Resp: CTAB, no wheezing, rales, or rhonchi, comfortable work of breathing Abd: non-distended, non-tender, soft, +bs in all four quadrants Ext: no clubbing, cyanosis, mild bilateral edema +1 Skin: warm, dry, intact, no rashes  Laboratory: Recent Labs  Lab 04/20/19 0929 04/20/19 1645 04/21/19 0217  WBC 6.7 6.6 7.5  HGB 15.5* 15.2* 14.6  HCT 48.3* 47.0* 44.7  PLT 228 176 205   Recent Labs  Lab 04/20/19 0929 04/20/19 1645 04/21/19 0217  NA 137  --  138  K 3.8  --  3.7  CL 100  --  97*  CO2 29  --  30  BUN 11  --  7*  CREATININE 0.91 0.76 0.77  CALCIUM 9.1  --  8.8*  PROT 7.6  --  6.0*  BILITOT 0.8  --  1.1  ALKPHOS 107  --  90  ALT 11  --  8  AST 14*  --  13*  GLUCOSE 52*  --  162*    Imaging/Diagnostic Tests: Brain MRI IMPRESSION: No acute intracranial finding. Mild chronic small-vessel ischemic change of the pons and cerebral hemispheric white matter considering age.  CT Head: IMPRESSION: Motion limited exam. No definite acute intracranial abnormality.  Nuala Alpha, DO 04/22/2019, 8:23 AM PGY-3, [place] Intern pager: [phone], text pages welcome

## 2019-04-23 DIAGNOSIS — R531 Weakness: Secondary | ICD-10-CM

## 2019-04-23 LAB — CBC
HCT: 44.6 % (ref 36.0–46.0)
Hemoglobin: 14.3 g/dL (ref 12.0–15.0)
MCH: 31.3 pg (ref 26.0–34.0)
MCHC: 32.1 g/dL (ref 30.0–36.0)
MCV: 97.6 fL (ref 80.0–100.0)
Platelets: 184 10*3/uL (ref 150–400)
RBC: 4.57 MIL/uL (ref 3.87–5.11)
RDW: 12.9 % (ref 11.5–15.5)
WBC: 5.7 10*3/uL (ref 4.0–10.5)
nRBC: 0 % (ref 0.0–0.2)

## 2019-04-23 LAB — BASIC METABOLIC PANEL
Anion gap: 10 (ref 5–15)
BUN: 15 mg/dL (ref 8–23)
CO2: 31 mmol/L (ref 22–32)
Calcium: 9.1 mg/dL (ref 8.9–10.3)
Chloride: 96 mmol/L — ABNORMAL LOW (ref 98–111)
Creatinine, Ser: 1.09 mg/dL — ABNORMAL HIGH (ref 0.44–1.00)
GFR calc Af Amer: 57 mL/min — ABNORMAL LOW (ref >60)
GFR calc non Af Amer: 49 mL/min — ABNORMAL LOW (ref >60)
Glucose, Bld: 186 mg/dL — ABNORMAL HIGH (ref 70–99)
Potassium: 3.9 mmol/L (ref 3.5–5.1)
Sodium: 137 mmol/L (ref 135–145)

## 2019-04-23 LAB — GLUCOSE, CAPILLARY
Glucose-Capillary: 169 mg/dL — ABNORMAL HIGH (ref 70–99)
Glucose-Capillary: 167 mg/dL — ABNORMAL HIGH (ref 70–99)
Glucose-Capillary: 185 mg/dL — ABNORMAL HIGH (ref 70–99)
Glucose-Capillary: 175 mg/dL — ABNORMAL HIGH (ref 70–99)

## 2019-04-23 MED ORDER — ENOXAPARIN SODIUM 80 MG/0.8ML ~~LOC~~ SOLN
0.5000 mg/kg | SUBCUTANEOUS | Status: DC
  Administered 2019-04-23: 75 mg via SUBCUTANEOUS
  Filled 2019-04-23: qty 0.8

## 2019-04-23 MED ORDER — LIVING WELL WITH DIABETES BOOK
Freq: Once | Status: AC
  Filled 2019-04-23: qty 1

## 2019-04-23 NOTE — Progress Notes (Addendum)
Family Medicine Teaching Service Daily Progress Note Intern Pager: [phone]  Patient name: [name] Medical record number: [id number] Date of birth: 1940-11-22 Age: 79 y.o. Gender: female  Primary Care Provider: Patient, No Pcp Per Consultants: None Code Status: Full   Of note, selected for chart merge.  [name] MRN: [id number]  Pt Overview and Major Events to Date:  04/20/2019-admitted  Assessment and Plan: [name] is a 79 y.o. female presenting with altered mental status and seizure-like activity. PMH is significant for insulin-dependent type 2 diabetes with history of severe hypoglycemia and neuropathy, HFpEF, stroke, ?hepatic cirrhosis, HTN, HLD, depression  Altered mental status  Hypoglycemia resolved Alert and oriented x4 today.  Patient has capacity to make her own decisions. Patient likely took too much insulin due to poor understanding of diabetes management.  Glucose in the 50s on admission, A1c 8.6. Spoke with patient's endocrinologist today who recommended discharging with Lantus and follow-up with his office.  Patient has scheduled appointment on 04/27/2019. MRI with no acute intracranial abnormality.  UDS was negative. -Continue to monitor CBGs -Lantus 10 units nightly -Moderate sliding scale -PT/OT recommending 24hour care; SNF -Consult with social work regarding SNF placement  History of severe hypoglycemia, T2DM Fasting CBG 169 today. CBGs yesterday 210s-260s.  Patient required 10 units of sliding scale and started 10 units of basal insulin overnight. A1c on admission 8.6. Patient on Lantus 100 units daily and Humalog 70 units with dinner, confirmed with outpatient pharmacy.  Was recently started on Ozempic per endocrinology which pharmacy confirms patient did pick up.  Per Endo note patient does have a history of severe hypoglycemia with last episode in 2011.  Last A1c 9.4 on 01/19/2019. -Monitor CBGs -Continue mSSI -Continue 10U  Lantus, titrate up as needed   History of seizures: Home meds include gabapentin.  Patient was previously on [place] in 2011.  EMS apparently witnessed tonic clonic seizure-like activity.  Patient status post Keppra loading - Cont Keppra - Cont to hold gabapentin but can restart if patient requests - Follow-up outpatient with neurology  Constipation, left lower quadrant abdominal pain Patient reports she had a bowel movement and denies abdominal pain today.  Denies blood in stool. Patient denies have any history of diverticulosis, diverticulitis. - Continue MiraLAX and senna   HFpEF Echo 2017 with left ventricle ejection fraction 123456, grade 1 diastolic dysfunction.  On Lasix 20 mg as home med. -Discontinued home Lasix 20mg  -Cont home Cozaar 100mg   HTN Chronic, stable.  Hypertensive this morning at 160/78.  BP range yesterday 108/64-150/65.  On home Cozaar, HCTZ, and Lasix - Cont HCTZ 12.5mg  - Discontinued Lasix 20mg  - Cont Cozaar 100mg   History of stroke 2006 with resultant right-sided hemiparesis, improved with PT per patient's daughter.    Hepatic cirrhosis Ammonia level mildly elevated at 37.  Hepatic cirrhosis noted in chart review from 2008.  CT abdomen pelvis in 2019 with mild hepatomegaly.  LFTs within normal limits - Repeat LFTs outpatient   GERD -Continue home omeprazole 40 mg  Depression -Hold home mirtazapine -Continue home bupropion  Hyperlipidemia Previously on statin per chart review though none seen past 2017 possibly related to documented hepatic cirrhosis.  Will defer to PCP Dr. Madalyn Rob with [facility].  FEN/GI: Heart healthy carb modified diet, senna, MiraLAX, replete electrolytes as needed  Access: Peripheral IV  Disposition: Admit to progressive  Subjective:  [name]    Objective: Temp:  [98 F (36.7 C)-98.9 F (37.2 C)] 98 F (36.7 C) (01/04 0436) Pulse Rate:  [  62-73] 69 (01/04 0436) Resp:  [15-24] 24 (01/04 0436) BP:  (108-150)/(64-96) 125/70 (01/04 0436) SpO2:  [91 %-97 %] 96 % (01/04 0436)  GEN: Elderly female lying in bed in no acute distress CV: regular rate and rhythm, no murmurs appreciated  RESP: no increased work of breathing, clear to ascultation bilaterally with no crackles, wheezes, or rhonchi  ABD: Obese abdomen, bowel sounds present, soft, nontender  MSK: no lower extremity edema, or cyanosis noted SKIN: warm, dry NEURO: grossly normal, moves all extremities appropriately, alert and oriented x4    Laboratory: Recent Labs  Lab 04/21/19 0217 04/22/19 1214 04/23/19 0546  WBC 7.5 4.9 5.7  HGB 14.6 14.1 14.3  HCT 44.7 43.2 44.6  PLT 205 205 184   Recent Labs  Lab 04/20/19 0929 04/20/19 1645 04/21/19 0217 04/22/19 1214  NA 137  --  138 137  K 3.8  --  3.7 4.1  CL 100  --  97* 101  CO2 29  --  30 28  BUN 11  --  7* 11  CREATININE 0.91 0.76 0.77 1.03*  CALCIUM 9.1  --  8.8* 8.8*  PROT 7.6  --  6.0*  --   BILITOT 0.8  --  1.1  --   ALKPHOS 107  --  90  --   ALT 11  --  8  --   AST 14*  --  13*  --   GLUCOSE 52*  --  162* 216*    Imaging/Diagnostic Tests: Brain MRI IMPRESSION: No acute intracranial finding. Mild chronic small-vessel ischemic change of the pons and cerebral hemispheric white matter considering age.  CT Head: IMPRESSION: Motion limited exam. No definite acute intracranial abnormality.  Lyndee Hensen, DO PGY-1, [place] [facility] 04/23/2019 8:04 AM    FPTS Intern pager: [phone], text pages welcome

## 2019-04-23 NOTE — Care Management (Addendum)
Daughter requests that pt discharge to SNF as family can not provide 24 hour supervision as recommended.    Update:  Pt actually has [employer] Medicare not [place] is updated.  Pt declined SNF for discharge.   Pt informed CM that she will have 24 hour supervision at discharge;  [place] from 9-11am M-F, Patients boyfriend arrives home from work at [place] and stays with pt until 4pm the following day , pts son can be with pt for the other hours of the day.   Per pt she has 24 hour coverage on the weekends.  CM provided medicare.gov HH list - pt chose [facility] - referral accepted. CM attempted to secure a CM for pt via insurance per order however was unable to secure via insurance.  [facility] informed of request for outpt CM follow up - Alvis Lemmings will provide service within the home health modalities.   Pt informed CM that her boyfriend will take her home from hospital.    CM discussed discharge plan and discrepancy with daughter vs pt on supervision with attending group.  Attending group confirms pt has capacity to determine discharge plan and that home is acceptable with [place] arranged .  CM contacted by DSS SW for pt per daughters request - agency has secured additional hours 14 total per week and will reassess at the end of the month - home health aide agency can accept pt back into the home tomorrow .

## 2019-04-23 NOTE — Progress Notes (Addendum)
Inpatient Diabetes Program Recommendations  AACE/ADA: New Consensus Statement on Inpatient Glycemic Control (2015)  Target Ranges:  Prepandial:   less than 140 mg/dL      Peak postprandial:   less than 180 mg/dL (1-2 hours)      Critically ill patients:  140 - 180 mg/dL   Lab Results  Component Value Date   GLUCAP 169 (H) 04/23/2019   HGBA1C 8.6 (H) 04/20/2019    Review of Glycemic Control  Diabetes history: DM2 Outpatient Diabetes medications: Lantus 100 units qd + Humalog 60 units ac supper Current orders for Inpatient glycemic control: Lantus 10 units + Novolog moderate correction tid with meals  Inpatient Diabetes Program Recommendations:   Spoke w patient's daughter Lona Kettle [phone] to verify patient gives her own insulin. States patient gives her own insulin injections. Reviewed with daughter need to evaluate with doctors insulin doses based on CBGs.  -Add Novolog 3 units tid meal coverage if eats 50%  Will plan to speak with patient regarding diabetes management. Spoke with Dr. Susa Simmonds with recommendation of adding meal coverage. Spoke with patient and she said she gives her own insulin injections-takes Lantus 100 units am & Humalog 60 units around 8:30 pm when she eats. However, patient shared that she has not been eating regularly but takes her Humalog even though she doesn't eat. Patient states she has not told her endocrinologist that she isn't eating well. Gave update to Dr. Susa Simmonds after speaking with patient. Explained with patient to not take meal coverage with Humalog if she does not plan to eat.  Thank you, Nani Gasser. Lamaj Metoyer, RN, MSN, CDE  Diabetes Coordinator Inpatient Glycemic Control Team Team Pager [phone] (8am-5pm) 04/23/2019 11:12 AM

## 2019-04-24 LAB — GLUCOSE, CAPILLARY
Glucose-Capillary: 160 mg/dL — ABNORMAL HIGH (ref 70–99)
Glucose-Capillary: 130 mg/dL — ABNORMAL HIGH (ref 70–99)
Glucose-Capillary: 209 mg/dL — ABNORMAL HIGH (ref 70–99)

## 2019-04-24 MED ORDER — ATORVASTATIN CALCIUM 40 MG PO TABS: 40.0000 mg | ORAL_TABLET | Freq: Every day | ORAL | 11 refills | Status: DC

## 2019-04-24 MED ORDER — INSULIN GLARGINE 100 UNITS/ML SOLOSTAR PEN: 10.0000 [IU] | PEN_INJECTOR | Freq: Every day | SUBCUTANEOUS | 11 refills | Status: DC

## 2019-04-24 MED ORDER — LEVETIRACETAM 500 MG PO TABS: 500.0000 mg | ORAL_TABLET | Freq: Two times a day (BID) | ORAL | 0 refills | Status: DC

## 2019-04-24 MED FILL — ATORVASTATIN CALCIUM 40 MG: 40 | 30 days supply | Qty: 30 | Fill #0

## 2019-04-24 MED FILL — levETIRAcetam 500 MG TABS: 500 | 30 days supply | Qty: 60 | Fill #0

## 2019-04-24 NOTE — TOC Transition Note (Addendum)
Transition of Care [facility]) - CM/SW Discharge Note   Patient Details  Name: [name] MRN: [id number] Date of Birth: 11-30-1940  Transition of Care [facility]) CM/SW Contact:  Maryclare Labrador, RN Phone Number: 04/24/2019, 3:39 PM   Clinical Narrative:  Pt to discharge home today - pt continues to refuse SNF.  [facility] aware that pt will discharge home today.  Pt request that agency contact [name] regarding start date of [facility] at [phone] (number and contact name given to agency).   Pt again declined all needs related to [place] [facility] program.  CM informed TOC of barrier with getting a Case Manager to follow  pt via [place] requested CM to reach out to [facility].  CM spoke with Gerald Stabs with [facility] - liaison to reach out to [employer] and relay request.  Attending in is agreement for pt to discharge home without this being finalized as pt will discharge home with [facility].  CM requested pts PCP office  contact pt daughter per request of pt with follow up appt.  Pts daughter informed CM that pts boyfriend can pay for discharge med copays when he picks pt up.   No other CM needs determined - CM signing off  Update:  CM spoke with Jeannie Done with [facility] - pt will be set up with a CM and agency will reach out to pt tomorrow - CM gave liaison pts daughter phone number    Final next level of care: [place] Barriers to Discharge: Barriers Resolved   Patient Goals and CMS Choice Patient states their goals for this hospitalization and ongoing recovery are:: to go home CMS Medicare.gov Compare Post Acute Care list provided to:: Patient Choice offered to / list presented to : Patient  Discharge Placement                       Discharge Plan and Services   Discharge Planning Services: CM Consult Post Acute Care Choice: Home Health                    HH Arranged: RN, PT, OT, Social Work [facility] Agency: [place] Date [place]: 04/23/19 Time [place]: 1418 Representative spoke with at [place]: [place] ([place]) Interventions     Readmission Risk Interventions No flowsheet data found.

## 2019-04-24 NOTE — Plan of Care (Signed)
VS stable, Adequate for discharge.

## 2019-04-24 NOTE — Progress Notes (Signed)
Occupational Therapy Treatment Patient Details Name: [name] MRN: [id number] DOB: 11-11-1940 Today's Date: 04/24/2019    History of present illness 79 y.o. female presenting with AMS and seizure-like activity. MRI showing no acute changes. PMH is significant for insulin-dependent type 2 diabetes with history of severe hypoglycemia and neuropathy, HFpEF, stroke, ?hepatic cirrhosis, HTN, HLD, and depression.   OT comments  Pt making gradual progress towards OT goals, lethargic this session but is agreeable to working with therapist. Pt engaging in seated grooming ADL with setup assist, attempted sit<>stand trial however pt reports too fatigued and reports increased dizziness after pulling self forwards in preparation to stand (BP monitored and 113/79). Pt able to perform UB/LB therex seated in recliner given increased time and requiring AAROM for LUE due to fatigue/weakness. Pt continues to decline SNF level therapy services and reports plans to return home with assist from significant other and a nurses aide for ADL/iADL tasks. Recommend continued HHOT services after discharge. Will follow while she remains in acute setting.   Follow Up Recommendations  Home health OT;Supervision/Assistance - 24 hour(pt declining SNF)    Equipment Recommendations  None recommended by OT          Precautions / Restrictions Precautions Precautions: Fall Restrictions Weight Bearing Restrictions: No       Mobility Bed Mobility Overal bed mobility: Needs Assistance Bed Mobility: Supine to Sit     Supine to sit: Min guard;HOB elevated     General bed mobility comments: received OOB in recliner  Transfers Overall transfer level: Needs assistance Equipment used: Rolling walker (2 wheeled) Transfers: Sit to/from [facility] Sit to Stand: Min guard Stand pivot transfers: Min guard       General transfer comment: pt pulls self foward in preparation to stand however  reports too fatigued and increased dizziness (VSS)     Balance Overall balance assessment: Needs assistance Sitting-balance support: No upper extremity supported;Feet supported Sitting balance-Leahy Scale: Good     Standing balance support: Bilateral upper extremity supported Standing balance-Leahy Scale: Poor Standing balance comment: dependent on RW                           ADL either performed or assessed with clinical judgement   ADL Overall ADL's : Needs assistance/impaired Eating/Feeding: Set up;Sitting Eating/Feeding Details (indicate cue type and reason): pt finishing lunch upon arrival to room Grooming: Wash/dry face;Set up;Sitting                                 General ADL Comments: pt with fluctuating levels of lethargy during session, continues to present with weakness, decreased activity tolerance                       Cognition Arousal/Alertness: Lethargic Behavior During Therapy: WFL for tasks assessed/performed Overall Cognitive Status: Within Functional Limits for tasks assessed                                 General Comments: appears to be accurate historian when asking about support at home, A&Ox4        Exercises Exercises: General Upper Extremity;General Lower Extremity General Exercises - Upper Extremity Shoulder Flexion: AROM;Both;5 reps General Exercises - Lower Extremity Long Arc Quad: AROM;Both;5 reps;Seated   Shoulder Instructions  General Comments VSS on RA, pt reports dizziness with sitting upright, BP taken and 113/79 (89)    Pertinent Vitals/ Pain       Pain Assessment: No/denies pain Faces Pain Scale: Hurts even more Pain Location: L lateral thigh Pain Descriptors / Indicators: Burning Pain Intervention(s): Monitored during session  Home Living                                          Prior Functioning/Environment              Frequency  Min 2X/week         Progress Toward Goals  OT Goals(current goals can now be found in the care plan section)  Progress towards OT goals: OT to reassess next treatment  Acute Rehab OT Goals Patient Stated Goal: go home today OT Goal Formulation: With patient Time For Goal Achievement: 05/05/19 Potential to Achieve Goals: Good ADL Goals Pt Will Perform Upper Body Dressing: with set-up;with supervision;sitting Pt Will Perform Lower Body Dressing: with min assist;with adaptive equipment;sit to/from stand Pt Will Transfer to Toilet: with supervision;bedside commode;stand pivot transfer Pt Will Perform Toileting - Clothing Manipulation and hygiene: with min guard assist;sit to/from stand;sitting/lateral leans;with adaptive equipment  Plan Discharge plan remains appropriate(pt declining SNF)    Co-evaluation                 AM-PAC OT "6 Clicks" Daily Activity     Outcome Measure   Help from another person eating meals?: None Help from another person taking care of personal grooming?: A Little Help from another person toileting, which includes using toliet, bedpan, or urinal?: A Lot Help from another person bathing (including washing, rinsing, drying)?: A Lot Help from another person to put on and taking off regular upper body clothing?: A Little Help from another person to put on and taking off regular lower body clothing?: A Lot 6 Click Score: 16    End of Session    OT Visit Diagnosis: Unsteadiness on feet (R26.81);Other abnormalities of gait and mobility (R26.89);Muscle weakness (generalized) (M62.81)   Activity Tolerance Patient limited by lethargy;Patient limited by fatigue   Patient Left in chair;with call bell/phone within reach;with chair alarm set   Nurse Communication Mobility status        Time: [id number] OT Time Calculation (min): 17 min  Charges: OT General Charges $OT Visit: 1 Visit OT Treatments $Self Care/Home Management : 8-22 mins  Lou Cal,  OT Supplemental Rehabilitation Services Pager [phone] Office [phone]   Raymondo Band 04/24/2019, 3:25 PM

## 2019-04-24 NOTE — Discharge Instructions (Signed)
Hypoglycemia Hypoglycemia is when the sugar (glucose) level in your blood is too low. Signs of low blood sugar may include:  Feeling: ? Hungry. ? Worried or nervous (anxious). ? Sweaty and clammy. ? Confused. ? Dizzy. ? Sleepy. ? Sick to your stomach (nauseous).  Having: ? A fast heartbeat. ? A headache. ? A change in your vision. ? Tingling or no feeling (numbness) around your mouth, lips, or tongue. ? Jerky movements that you cannot control (seizure).  Having trouble with: ? Moving (coordination). ? Sleeping. ? Passing out (fainting). ? Getting upset easily (irritability). Low blood sugar can happen to people who have diabetes and people who do not have diabetes. Low blood sugar can happen quickly, and it can be an emergency. Treating low blood sugar Low blood sugar is often treated by eating or drinking something sugary right away, such as:  Fruit juice, 4-6 oz (120-150 mL).  Regular soda (not diet soda), 4-6 oz (120-150 mL).  Low-fat milk, 4 oz (120 mL).  Several pieces of hard candy.  Sugar or honey, 1 Tbsp (15 mL). Treating low blood sugar if you have diabetes If you can think clearly and swallow safely, follow the 15:15 rule:  Take 15 grams of a fast-acting carb (carbohydrate). Talk with your doctor about how much you should take.  Always keep a source of fast-acting carb with you, such as: ? Sugar tablets (glucose pills). Take 3-4 pills. ? 6-8 pieces of hard candy. ? 4-6 oz (120-150 mL) of fruit juice. ? 4-6 oz (120-150 mL) of regular (not diet) soda. ? 1 Tbsp (15 mL) honey or sugar.  Check your blood sugar 15 minutes after you take the carb.  If your blood sugar is still at or below 70 mg/dL (3.9 mmol/L), take 15 grams of a carb again.  If your blood sugar does not go above 70 mg/dL (3.9 mmol/L) after 3 tries, get help right away.  After your blood sugar goes back to normal, eat a meal or a snack within 1 hour.  Treating very low blood sugar If your  blood sugar is at or below 54 mg/dL (3 mmol/L), you have very low blood sugar (severe hypoglycemia). This may also cause:  Passing out.  Jerky movements you cannot control (seizure).  Losing consciousness (coma). This is an emergency. Do not wait to see if the symptoms will go away. Get medical help right away. Call your local emergency services (911 in the [place] Do not drive yourself to the hospital. If you have very low blood sugar and you cannot eat or drink, you may need a glucagon shot (injection). A family member or friend should learn how to check your blood sugar and how to give you a glucagon shot. Ask your doctor if you need to have a glucagon shot kit at home. Follow these instructions at home: General instructions  Take over-the-counter and prescription medicines only as told by your doctor.  Stay aware of your blood sugar as told by your doctor.  Limit alcohol intake to no more than 1 drink a day for nonpregnant women and 2 drinks a day for men. One drink equals 12 oz of beer (355 mL), 5 oz of wine (148 mL), or 1 oz of hard liquor (44 mL).  Keep all follow-up visits as told by your doctor. This is important. If you have diabetes:   Follow your diabetes care plan as told by your doctor. Make sure you: ? Know the signs of low blood sugar. ?  Take your medicines as told. ? Follow your exercise and meal plan. ? Eat on time. Do not skip meals. ? Check your blood sugar as often as told by your doctor. Always check it before and after exercise. ? Follow your sick day plan when you cannot eat or drink normally. Make this plan ahead of time with your doctor.  Share your diabetes care plan with: ? Your work or school. ? People you live with.  Check your pee (urine) for ketones: ? When you are sick. ? As told by your doctor.  Carry a card or wear jewelry that says you have diabetes. Contact a doctor if:  You have trouble keeping your blood sugar in your target  range.  You have low blood sugar often. Get help right away if:  You still have symptoms after you eat or drink something sugary.  Your blood sugar is at or below 54 mg/dL (3 mmol/L).  You have jerky movements that you cannot control.  You pass out. These symptoms may be an emergency. Do not wait to see if the symptoms will go away. Get medical help right away. Call your local emergency services (911 in the [place] Do not drive yourself to the hospital. Summary  Hypoglycemia happens when the level of sugar (glucose) in your blood is too low.  Low blood sugar can happen to people who have diabetes and people who do not have diabetes. Low blood sugar can happen quickly, and it can be an emergency.  Make sure you know the signs of low blood sugar and know how to treat it.  Always keep a source of sugar (fast-acting carb) with you to treat low blood sugar. This information is not intended to replace advice given to you by your health care provider. Make sure you discuss any questions you have with your health care provider. Document Revised: 07/27/2018 Document Reviewed: 05/09/2015 Elsevier Patient Education  [place].   Preventing Hypoglycemia Hypoglycemia occurs when the level of sugar (glucose) in the blood is too low. Hypoglycemia can happen in people who do or do not have diabetes (diabetes mellitus). It can develop quickly, and it can be a medical emergency. For most people with diabetes, a blood glucose level below 70 mg/dL (3.9 mmol/L) is considered hypoglycemia. Glucose is a type of sugar that provides the body's main source of energy. Certain hormones (insulin and glucagon) control the level of glucose in the blood. Insulin lowers blood glucose, and glucagon increases blood glucose. Hypoglycemia can result from having too much insulin in the bloodstream, or from not eating enough food that contains glucose. Your risk for hypoglycemia is higher:  If you take insulin or  diabetes medicines to help lower your blood glucose or help your body make more insulin.  If you skip or delay a meal or snack.  If you are ill.  During and after exercise. You can prevent hypoglycemia by working with your health care provider to adjust your meal plan as needed and by taking other precautions. How can hypoglycemia affect me? Mild symptoms Mild hypoglycemia may not cause any symptoms. If you do have symptoms, they may include:  Hunger.  Anxiety.  Sweating and feeling clammy.  Dizziness or feeling light-headed.  Sleepiness.  Nausea.  Increased heart rate.  Headache.  Blurry vision.  Irritability.  Tingling or numbness around the mouth, lips, or tongue.  A change in coordination.  Restless sleep. If mild hypoglycemia is not recognized and treated, it can quickly  become moderate or severe hypoglycemia. Moderate symptoms Moderate hypoglycemia can cause:  Mental confusion and poor judgment.  Behavior changes.  Weakness.  Irregular heartbeat. Severe symptoms Severe hypoglycemia is a medical emergency. It can cause:  Fainting.  Seizures.  Loss of consciousness (coma).  Death. What nutrition changes can be made?  Work with your health care provider or diet and nutrition specialist (dietitian) to make a healthy meal plan that is right for you. Follow your meal plan carefully.  Eat meals at regular times.  If recommended by your health care provider, have snacks between meals.  Donot skip or delay meals or snacks. You can be at risk for hypoglycemia if you are not getting enough carbohydrates. What lifestyle changes can be made?   Work closely with your health care provider to manage your blood glucose. Make sure you know: ? Your goal blood glucose levels. ? How and when to check your blood glucose. ? The symptoms of hypoglycemia. It is important to treat it right away to keep it from becoming severe.  Do not drink alcohol on an empty  stomach.  When you are ill, check your blood glucose more often than usual. Follow your sick day plan whenever you cannot eat or drink normally. Make this plan in advance with your health care provider.  Always check your blood glucose before, during, and after exercise. How is this treated? This condition can often be treated by immediately eating or drinking something that contains sugar, such as:  Fruit juice, 4-6 oz (120-150 mL).  Regular (not diet) soda, 4-6 oz (120-150 mL).  Low-fat milk, 4 oz (120 mL).  Several pieces of hard candy.  Sugar or honey, 1 Tbsp (15 mL). Treating hypoglycemia if you have diabetes If you are alert and able to swallow safely, follow the 15:15 rule:  Take 15 grams of a rapid-acting carbohydrate. Talk with your health care provider about how much you should take.  Rapid-acting options include: ? Glucose pills (take 15 grams). ? 6-8 pieces of hard candy. ? 4-6 oz (120-150 mL) of fruit juice. ? 4-6 oz (120-150 mL) of regular (not diet) soda.  Check your blood glucose 15 minutes after you take the carbohydrate.  If the repeat blood glucose level is still at or below 70 mg/dL (3.9 mmol/L), take 15 grams of a carbohydrate again.  If your blood glucose level does not increase above 70 mg/dL (3.9 mmol/L) after 3 tries, seek emergency medical care.  After your blood glucose level returns to normal, eat a meal or a snack within 1 hour. Treating severe hypoglycemia Severe hypoglycemia is when your blood glucose level is at or below 54 mg/dL (3 mmol/L). Severe hypoglycemia is a medical emergency. Get medical help right away. If you have severe hypoglycemia and you cannot eat or drink, you may need an injection of glucagon. A family member or close friend should learn how to check your blood glucose and how to give you a glucagon injection. Ask your health care provider if you need to have an emergency glucagon injection kit available. Severe hypoglycemia may  need to be treated in a hospital. The treatment may include getting glucose through an IV. You may also need treatment for the cause of your hypoglycemia. Where to find more information  American Diabetes Association: www.diabetes.[employer] of Diabetes and Digestive and Kidney Diseases: [employer] Contact a health care provider if:  You have problems keeping your blood glucose in your target range.  You have  frequent episodes of hypoglycemia. Get help right away if:  You continue to have hypoglycemia symptoms after eating or drinking something containing glucose.  Your blood glucose level is at or below 54 mg/dL (3 mmol/L).  You faint.  You have a seizure. These symptoms may represent a serious problem that is an emergency. Do not wait to see if the symptoms will go away. Get medical help right away. Call your local emergency services (911 in the [place] Summary  Know the symptoms of hypoglycemia, and when you are at risk for it (such as during exercise or when you are sick). Check your blood glucose often when you are at risk for hypoglycemia.  Hypoglycemia can develop quickly, and it can be dangerous if it is not treated right away. If you have a history of severe hypoglycemia, make sure you know how to use your glucagon injection kit.  Make sure you know how to treat hypoglycemia. Keep a carbohydrate snack available when you may be at risk for hypoglycemia. This information is not intended to replace advice given to you by your health care provider. Make sure you discuss any questions you have with your health care provider. Document Revised: 07/28/2018 Document Reviewed: 12/01/2016 Elsevier Patient Education  [place].

## 2019-04-24 NOTE — Care Management Important Message (Signed)
Important Message  Patient Details  Name: [name] MRN: [id number] Date of Birth: 1941/03/21   Medicare Important Message Given:  Yes     Daisie Haft Montine Circle 04/24/2019, 3:03 PM

## 2019-04-24 NOTE — Progress Notes (Signed)
Physical Therapy Treatment Patient Details Name: [name] MRN: [id number] DOB: 12-25-1940 Today's Date: 04/24/2019    History of Present Illness 79 y.o. female presenting with AMS and seizure-like activity. MRI showing no acute changes. PMH is significant for insulin-dependent type 2 diabetes with history of severe hypoglycemia and neuropathy, HFpEF, stroke, ?hepatic cirrhosis, HTN, HLD, and depression.    PT Comments    Pt continues to require laborous effort for supine to sit due to body habitus and being deconditioned. Pt given RW for std pvt transfer and pt reports being more steady. Pt continues to defer ambulation as pt reports "I just use my electric scooter, so I don't really walk anywhere." Acute PT to cont to follow.    Follow Up Recommendations  Home health PT;Supervision/Assistance - 24 hour     Equipment Recommendations  None recommended by PT    Recommendations for Other Services       Precautions / Restrictions Precautions Precautions: Fall Restrictions Weight Bearing Restrictions: No    Mobility  Bed Mobility Overal bed mobility: Needs Assistance Bed Mobility: Supine to Sit     Supine to sit: Min guard;HOB elevated     General bed mobility comments: increased time, labored effort for trunk elevation, dependent on bed rails  Transfers Overall transfer level: Needs assistance Equipment used: Rolling walker (2 wheeled) Transfers: Sit to/from [facility] Sit to Stand: Min guard Stand pivot transfers: Min guard       General transfer comment: increased time, used momentum and rocking forward, verbal cues to push up from bed, required walker to support self in standing to complete std pvt to chair  Ambulation/Gait             General Gait Details: pt deferred ambulation due to "I don't walk, I just use my electric scooter"   Stairs             Wheelchair Mobility    Modified Rankin (Stroke Patients Only)        Balance Overall balance assessment: Needs assistance Sitting-balance support: No upper extremity supported;Feet supported Sitting balance-Leahy Scale: Good     Standing balance support: Bilateral upper extremity supported Standing balance-Leahy Scale: Poor Standing balance comment: dependent on RW                            Cognition Arousal/Alertness: Awake/alert Behavior During Therapy: WFL for tasks assessed/performed Overall Cognitive Status: Within Functional Limits for tasks assessed                                        Exercises      General Comments General comments (skin integrity, edema, etc.): VSS, RA      Pertinent Vitals/Pain Pain Assessment: Faces Faces Pain Scale: Hurts even more Pain Location: L lateral thigh Pain Descriptors / Indicators: Burning Pain Intervention(s): Monitored during session    Home Living                      Prior Function            PT Goals (current goals can now be found in the care plan section) Acute Rehab PT Goals Patient Stated Goal: go home today Progress towards PT goals: Progressing toward goals    Frequency    Min 3X/week      PT Plan  Current plan remains appropriate    Co-evaluation              AM-PAC PT "6 Clicks" Mobility   Outcome Measure  Help needed turning from your back to your side while in a flat bed without using bedrails?: A Little Help needed moving from lying on your back to sitting on the side of a flat bed without using bedrails?: A Little Help needed moving to and from a bed to a chair (including a wheelchair)?: A Little Help needed standing up from a chair using your arms (e.g., wheelchair or bedside chair)?: A Little Help needed to walk in hospital room?: A Lot Help needed climbing 3-5 steps with a railing? : A Lot 6 Click Score: 16    End of Session Equipment Utilized During Treatment: Gait belt Activity Tolerance: Patient  tolerated treatment well Patient left: in chair;with call bell/phone within reach;with chair alarm set Nurse Communication: Mobility status PT Visit Diagnosis: Muscle weakness (generalized) (M62.81)     Time: [id number] PT Time Calculation (min) (ACUTE ONLY): 20 min  Charges:  $Therapeutic Activity: 8-22 mins                     Kittie Plater, PT, DPT Acute Rehabilitation Services Pager #: [phone] Office #: [phone]    Berline Lopes 04/24/2019, 2:25 PM

## 2019-04-25 ENCOUNTER — Encounter: Payer: Self-pay | Admitting: Endocrinology

## 2019-04-27 ENCOUNTER — Other Ambulatory Visit: Payer: Self-pay

## 2019-04-27 ENCOUNTER — Ambulatory Visit (INDEPENDENT_AMBULATORY_CARE_PROVIDER_SITE_OTHER): Payer: Medicare PPO | Admitting: Endocrinology

## 2019-04-27 DIAGNOSIS — Z794 Long term (current) use of insulin: Secondary | ICD-10-CM | POA: Diagnosis not present

## 2019-04-27 DIAGNOSIS — E1121 Type 2 diabetes mellitus with diabetic nephropathy: Secondary | ICD-10-CM | POA: Diagnosis not present

## 2019-04-27 DIAGNOSIS — N1831 Chronic kidney disease, stage 3a: Secondary | ICD-10-CM

## 2019-04-27 DIAGNOSIS — E1122 Type 2 diabetes mellitus with diabetic chronic kidney disease: Secondary | ICD-10-CM

## 2019-04-27 NOTE — Patient Instructions (Signed)
Please stop taking the Ozempic, and: Please continue the same insulins. Please come back for a follow-up appointment in 1 month.
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
## 2019-04-30 DIAGNOSIS — G9340 Encephalopathy, unspecified: Secondary | ICD-10-CM | POA: Diagnosis not present

## 2019-04-30 DIAGNOSIS — F419 Anxiety disorder, unspecified: Secondary | ICD-10-CM | POA: Diagnosis not present

## 2019-04-30 DIAGNOSIS — I110 Hypertensive heart disease with heart failure: Secondary | ICD-10-CM | POA: Diagnosis not present

## 2019-04-30 DIAGNOSIS — K7460 Unspecified cirrhosis of liver: Secondary | ICD-10-CM | POA: Diagnosis not present

## 2019-04-30 DIAGNOSIS — I69351 Hemiplegia and hemiparesis following cerebral infarction affecting right dominant side: Secondary | ICD-10-CM | POA: Diagnosis not present

## 2019-04-30 DIAGNOSIS — G4089 Other seizures: Secondary | ICD-10-CM | POA: Diagnosis not present

## 2019-04-30 DIAGNOSIS — I5030 Unspecified diastolic (congestive) heart failure: Secondary | ICD-10-CM | POA: Diagnosis not present

## 2019-04-30 DIAGNOSIS — E1140 Type 2 diabetes mellitus with diabetic neuropathy, unspecified: Secondary | ICD-10-CM | POA: Diagnosis not present

## 2019-04-30 DIAGNOSIS — M170 Bilateral primary osteoarthritis of knee: Secondary | ICD-10-CM | POA: Diagnosis not present

## 2019-05-02 DIAGNOSIS — G4089 Other seizures: Secondary | ICD-10-CM | POA: Diagnosis not present

## 2019-05-02 DIAGNOSIS — I110 Hypertensive heart disease with heart failure: Secondary | ICD-10-CM | POA: Diagnosis not present

## 2019-05-02 DIAGNOSIS — M170 Bilateral primary osteoarthritis of knee: Secondary | ICD-10-CM | POA: Diagnosis not present

## 2019-05-02 DIAGNOSIS — K7460 Unspecified cirrhosis of liver: Secondary | ICD-10-CM | POA: Diagnosis not present

## 2019-05-02 DIAGNOSIS — I5030 Unspecified diastolic (congestive) heart failure: Secondary | ICD-10-CM | POA: Diagnosis not present

## 2019-05-02 DIAGNOSIS — F419 Anxiety disorder, unspecified: Secondary | ICD-10-CM | POA: Diagnosis not present

## 2019-05-02 DIAGNOSIS — I69351 Hemiplegia and hemiparesis following cerebral infarction affecting right dominant side: Secondary | ICD-10-CM | POA: Diagnosis not present

## 2019-05-02 DIAGNOSIS — G9340 Encephalopathy, unspecified: Secondary | ICD-10-CM | POA: Diagnosis not present

## 2019-05-02 DIAGNOSIS — E1140 Type 2 diabetes mellitus with diabetic neuropathy, unspecified: Secondary | ICD-10-CM | POA: Diagnosis not present

## 2019-05-03 DIAGNOSIS — I69351 Hemiplegia and hemiparesis following cerebral infarction affecting right dominant side: Secondary | ICD-10-CM | POA: Diagnosis not present

## 2019-05-03 DIAGNOSIS — F419 Anxiety disorder, unspecified: Secondary | ICD-10-CM | POA: Diagnosis not present

## 2019-05-03 DIAGNOSIS — K7460 Unspecified cirrhosis of liver: Secondary | ICD-10-CM | POA: Diagnosis not present

## 2019-05-03 DIAGNOSIS — E1140 Type 2 diabetes mellitus with diabetic neuropathy, unspecified: Secondary | ICD-10-CM | POA: Diagnosis not present

## 2019-05-03 DIAGNOSIS — I110 Hypertensive heart disease with heart failure: Secondary | ICD-10-CM | POA: Diagnosis not present

## 2019-05-03 DIAGNOSIS — G4089 Other seizures: Secondary | ICD-10-CM | POA: Diagnosis not present

## 2019-05-03 DIAGNOSIS — G9340 Encephalopathy, unspecified: Secondary | ICD-10-CM | POA: Diagnosis not present

## 2019-05-03 DIAGNOSIS — M170 Bilateral primary osteoarthritis of knee: Secondary | ICD-10-CM | POA: Diagnosis not present

## 2019-05-03 DIAGNOSIS — I5030 Unspecified diastolic (congestive) heart failure: Secondary | ICD-10-CM | POA: Diagnosis not present

## 2019-05-04 DIAGNOSIS — Z993 Dependence on wheelchair: Secondary | ICD-10-CM | POA: Diagnosis not present

## 2019-05-04 DIAGNOSIS — Z79899 Other long term (current) drug therapy: Secondary | ICD-10-CM | POA: Diagnosis not present

## 2019-05-04 DIAGNOSIS — Z794 Long term (current) use of insulin: Secondary | ICD-10-CM | POA: Diagnosis not present

## 2019-05-04 DIAGNOSIS — G40909 Epilepsy, unspecified, not intractable, without status epilepticus: Secondary | ICD-10-CM | POA: Diagnosis not present

## 2019-05-04 DIAGNOSIS — E1165 Type 2 diabetes mellitus with hyperglycemia: Secondary | ICD-10-CM | POA: Diagnosis not present

## 2019-05-04 DIAGNOSIS — M79605 Pain in left leg: Secondary | ICD-10-CM | POA: Diagnosis not present

## 2019-05-04 DIAGNOSIS — Z008 Encounter for other general examination: Secondary | ICD-10-CM | POA: Diagnosis not present

## 2019-05-07 ENCOUNTER — Telehealth: Payer: Self-pay | Admitting: Endocrinology

## 2019-05-07 DIAGNOSIS — I69351 Hemiplegia and hemiparesis following cerebral infarction affecting right dominant side: Secondary | ICD-10-CM | POA: Diagnosis not present

## 2019-05-07 DIAGNOSIS — G4089 Other seizures: Secondary | ICD-10-CM | POA: Diagnosis not present

## 2019-05-07 DIAGNOSIS — M170 Bilateral primary osteoarthritis of knee: Secondary | ICD-10-CM | POA: Diagnosis not present

## 2019-05-07 DIAGNOSIS — I110 Hypertensive heart disease with heart failure: Secondary | ICD-10-CM | POA: Diagnosis not present

## 2019-05-07 DIAGNOSIS — E1140 Type 2 diabetes mellitus with diabetic neuropathy, unspecified: Secondary | ICD-10-CM | POA: Diagnosis not present

## 2019-05-07 DIAGNOSIS — G9340 Encephalopathy, unspecified: Secondary | ICD-10-CM | POA: Diagnosis not present

## 2019-05-07 DIAGNOSIS — I5030 Unspecified diastolic (congestive) heart failure: Secondary | ICD-10-CM | POA: Diagnosis not present

## 2019-05-07 DIAGNOSIS — K7460 Unspecified cirrhosis of liver: Secondary | ICD-10-CM | POA: Diagnosis not present

## 2019-05-07 DIAGNOSIS — F419 Anxiety disorder, unspecified: Secondary | ICD-10-CM | POA: Diagnosis not present

## 2019-05-07 NOTE — Telephone Encounter (Signed)
Called Bonaparte from [facility] and gave her verbal order to hold medications if blood sugar is less than 70. Called pt's home to verify amount of insulin she is currently taking as well as recent CGG levels. The person that answered the phone stated that they would have the pt return our call.

## 2019-05-07 NOTE — Telephone Encounter (Signed)
[name] from [facility] requests to be called at ph# [phone] to verify dosage of Lantus and Humalog

## 2019-05-07 NOTE — Telephone Encounter (Signed)
Please clarify

## 2019-05-07 NOTE — Telephone Encounter (Signed)
Please read dosages from med list.  Thank you.

## 2019-05-07 NOTE — Telephone Encounter (Signed)
Returned phone call to [facility] nurse and informed her of medication instructions. She questioned this because pt was recently discharged from the hospital after being admitted for a blood sugar of 20. At discharge, pt was instructed to stop taking Humalog completely and decrease Lantus from 120 units QAM to 10 units QAM.  Nurse stated that pt took not Humalog this am and fasting glucose was 206 this am.   Alvis Lemmings would also like parameters on when to hold insulin based of blood sugar levels.

## 2019-05-07 NOTE — Telephone Encounter (Signed)
Please hold insulin for cbg less than 70.  I need to know for sure, these: how much is pt taking now, and how cbg's are doing.

## 2019-05-09 ENCOUNTER — Ambulatory Visit (INDEPENDENT_AMBULATORY_CARE_PROVIDER_SITE_OTHER): Payer: Medicare PPO | Admitting: Podiatry

## 2019-05-09 ENCOUNTER — Other Ambulatory Visit: Payer: Self-pay

## 2019-05-09 DIAGNOSIS — I110 Hypertensive heart disease with heart failure: Secondary | ICD-10-CM | POA: Diagnosis not present

## 2019-05-09 DIAGNOSIS — E0843 Diabetes mellitus due to underlying condition with diabetic autonomic (poly)neuropathy: Secondary | ICD-10-CM | POA: Diagnosis not present

## 2019-05-09 DIAGNOSIS — M79676 Pain in unspecified toe(s): Secondary | ICD-10-CM

## 2019-05-09 DIAGNOSIS — G4089 Other seizures: Secondary | ICD-10-CM | POA: Diagnosis not present

## 2019-05-09 DIAGNOSIS — K7460 Unspecified cirrhosis of liver: Secondary | ICD-10-CM | POA: Diagnosis not present

## 2019-05-09 DIAGNOSIS — G9340 Encephalopathy, unspecified: Secondary | ICD-10-CM | POA: Diagnosis not present

## 2019-05-09 DIAGNOSIS — I5030 Unspecified diastolic (congestive) heart failure: Secondary | ICD-10-CM | POA: Diagnosis not present

## 2019-05-09 DIAGNOSIS — M170 Bilateral primary osteoarthritis of knee: Secondary | ICD-10-CM | POA: Diagnosis not present

## 2019-05-09 DIAGNOSIS — L989 Disorder of the skin and subcutaneous tissue, unspecified: Secondary | ICD-10-CM

## 2019-05-09 DIAGNOSIS — I69351 Hemiplegia and hemiparesis following cerebral infarction affecting right dominant side: Secondary | ICD-10-CM | POA: Diagnosis not present

## 2019-05-09 DIAGNOSIS — E1140 Type 2 diabetes mellitus with diabetic neuropathy, unspecified: Secondary | ICD-10-CM | POA: Diagnosis not present

## 2019-05-09 DIAGNOSIS — F419 Anxiety disorder, unspecified: Secondary | ICD-10-CM | POA: Diagnosis not present

## 2019-05-09 DIAGNOSIS — B351 Tinea unguium: Secondary | ICD-10-CM

## 2019-05-10 DIAGNOSIS — K7460 Unspecified cirrhosis of liver: Secondary | ICD-10-CM | POA: Diagnosis not present

## 2019-05-10 DIAGNOSIS — I110 Hypertensive heart disease with heart failure: Secondary | ICD-10-CM | POA: Diagnosis not present

## 2019-05-10 DIAGNOSIS — E1165 Type 2 diabetes mellitus with hyperglycemia: Secondary | ICD-10-CM | POA: Diagnosis not present

## 2019-05-10 DIAGNOSIS — E1140 Type 2 diabetes mellitus with diabetic neuropathy, unspecified: Secondary | ICD-10-CM | POA: Diagnosis not present

## 2019-05-10 DIAGNOSIS — I130 Hypertensive heart and chronic kidney disease with heart failure and stage 1 through stage 4 chronic kidney disease, or unspecified chronic kidney disease: Secondary | ICD-10-CM | POA: Diagnosis not present

## 2019-05-10 DIAGNOSIS — G40909 Epilepsy, unspecified, not intractable, without status epilepticus: Secondary | ICD-10-CM | POA: Diagnosis not present

## 2019-05-10 DIAGNOSIS — I5030 Unspecified diastolic (congestive) heart failure: Secondary | ICD-10-CM | POA: Diagnosis not present

## 2019-05-10 DIAGNOSIS — G4700 Insomnia, unspecified: Secondary | ICD-10-CM | POA: Diagnosis not present

## 2019-05-10 DIAGNOSIS — G9340 Encephalopathy, unspecified: Secondary | ICD-10-CM | POA: Diagnosis not present

## 2019-05-10 DIAGNOSIS — F330 Major depressive disorder, recurrent, mild: Secondary | ICD-10-CM | POA: Diagnosis not present

## 2019-05-10 DIAGNOSIS — E1142 Type 2 diabetes mellitus with diabetic polyneuropathy: Secondary | ICD-10-CM | POA: Diagnosis not present

## 2019-05-10 DIAGNOSIS — I5032 Chronic diastolic (congestive) heart failure: Secondary | ICD-10-CM | POA: Diagnosis not present

## 2019-05-10 DIAGNOSIS — M170 Bilateral primary osteoarthritis of knee: Secondary | ICD-10-CM | POA: Diagnosis not present

## 2019-05-10 DIAGNOSIS — G4089 Other seizures: Secondary | ICD-10-CM | POA: Diagnosis not present

## 2019-05-10 DIAGNOSIS — F419 Anxiety disorder, unspecified: Secondary | ICD-10-CM | POA: Diagnosis not present

## 2019-05-10 DIAGNOSIS — I69351 Hemiplegia and hemiparesis following cerebral infarction affecting right dominant side: Secondary | ICD-10-CM | POA: Diagnosis not present

## 2019-05-11 NOTE — Progress Notes (Signed)
Subjective: Patient is a 79 y.o. female presenting to the office today with a chief complaint of painful callus lesion(s) noted to the bilateral feet that have been present for the past several weeks. Walking and bearing weight increases the pain. She has not had any recent treatment for the symptoms.  Patient also complains of elongated, thickened nails that cause pain while ambulating in shoes. She is unable to trim her own nails. Patient presents today for further treatment and evaluation.  Past Medical History:  Diagnosis Date  . ALLERGIC RHINITIS 10/29/2006  . Arthritis    "hands, feet, legs, arms" (11/22/2012)  . ASYMPTOMATIC POSTMENOPAUSAL STATUS 02/22/2008  . 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 CHEST PAIN 02/22/2008   LHC in 7/05 and 1/11: normal; Myoview in 11/10 that demonstrated an EF of 51% and slight reversible anterior and septal defect of borderline significance (false + test);  Echo 04/2009:  Mild LVH, EF 55-60%, Gr 1 DD, MAC.    [name] CHF (congestive heart failure) ([place])   . Cholelithiasis   . Chronic back pain   . CIRRHOSIS 10/29/2006  . COLONIC POLYPS 09/13/2007  . COPD 04/22/2009  . DEGENERATIVE JOINT DISEASE 06/15/2007  . DEPRESSION 02/22/2008  . Depression   . Diabetes mellitus ([place])   . Diverticulosis   . Fatty liver   . Gastritis   . Gastroparesis   . GERD 10/29/2006  . Headache(784.0)    "not everyday now" (11/22/2012)  . Heart failure with preserved ejection fraction ([place])   . Hepatic cirrhosis ([place])   . HYPERCHOLESTEROLEMIA 02/25/2009  . Hyperlipidemia   . HYPERTENSION 12/20/2008  . Hypertension   . Migraine   . Myocardial infarction ([place])   . Neuropathy   . Obesity   . OSA (obstructive sleep apnea)    "quit wearing my CPAP" (11/22/2012)  . OSTEOPOROSIS 10/29/2006  . SEIZURE DISORDER    "silent seizures" (11/22/2012)  . Shortness of breath    "off and on" (11/22/2012)  . Stroke [facility]) 04/2004   Jerrol Banana 04/22/2004; "still weaker on the right"  (11/22/2012)  . Stroke ([place])   . Type II diabetes mellitus ([facility])     Objective:  Physical Exam General: Alert and oriented x3 in no acute distress  Dermatology: Hyperkeratotic lesion(s) present on the bilateral feet. Pain on palpation with a central nucleated core noted. Skin is warm, dry and supple bilateral lower extremities. Negative for open lesions or macerations. Nails are tender, long, thickened and dystrophic with subungual debris, consistent with onychomycosis, 1-5 bilateral. No signs of infection noted.  Vascular: Palpable pedal pulses bilaterally. No edema or erythema noted. Capillary refill within normal limits.  Neurological: Epicritic and protective threshold diminished bilaterally.   Musculoskeletal Exam: Pain on palpation at the keratotic lesion(s) noted. Range of motion within normal limits bilateral. Muscle strength 5/5 in all groups bilateral.  Assessment: 1. Onychodystrophic nails 1-5 bilateral with hyperkeratosis of nails.  2. Onychomycosis of nail due to dermatophyte bilateral 3. Pre-ulcerative callus lesions noted to the bilateral feet x 2   Plan of Care:  1. Patient evaluated. 2. Excisional debridement of keratoic lesion(s) using a chisel blade was performed without incident.  3. Dressed with light dressing. 4. Mechanical debridement of nails 1-5 bilaterally performed using a nail nipper. Filed with dremel without incident.  5. Patient is to return to the clinic in 3 months.   Edrick Kins, DPM [facility]  Dr. Edrick Kins, DPM    [phone]  [address]                                        [place], [place] [address]                Office [phone]  Fax [phone]

## 2019-05-15 DIAGNOSIS — I110 Hypertensive heart disease with heart failure: Secondary | ICD-10-CM | POA: Diagnosis not present

## 2019-05-15 DIAGNOSIS — I69351 Hemiplegia and hemiparesis following cerebral infarction affecting right dominant side: Secondary | ICD-10-CM | POA: Diagnosis not present

## 2019-05-15 DIAGNOSIS — G9340 Encephalopathy, unspecified: Secondary | ICD-10-CM | POA: Diagnosis not present

## 2019-05-15 DIAGNOSIS — M170 Bilateral primary osteoarthritis of knee: Secondary | ICD-10-CM | POA: Diagnosis not present

## 2019-05-15 DIAGNOSIS — G4089 Other seizures: Secondary | ICD-10-CM | POA: Diagnosis not present

## 2019-05-15 DIAGNOSIS — I5030 Unspecified diastolic (congestive) heart failure: Secondary | ICD-10-CM | POA: Diagnosis not present

## 2019-05-15 DIAGNOSIS — F419 Anxiety disorder, unspecified: Secondary | ICD-10-CM | POA: Diagnosis not present

## 2019-05-15 DIAGNOSIS — K7460 Unspecified cirrhosis of liver: Secondary | ICD-10-CM | POA: Diagnosis not present

## 2019-05-15 DIAGNOSIS — E1140 Type 2 diabetes mellitus with diabetic neuropathy, unspecified: Secondary | ICD-10-CM | POA: Diagnosis not present

## 2019-05-18 DIAGNOSIS — I110 Hypertensive heart disease with heart failure: Secondary | ICD-10-CM | POA: Diagnosis not present

## 2019-05-18 DIAGNOSIS — F419 Anxiety disorder, unspecified: Secondary | ICD-10-CM | POA: Diagnosis not present

## 2019-05-18 DIAGNOSIS — I5030 Unspecified diastolic (congestive) heart failure: Secondary | ICD-10-CM | POA: Diagnosis not present

## 2019-05-18 DIAGNOSIS — M170 Bilateral primary osteoarthritis of knee: Secondary | ICD-10-CM | POA: Diagnosis not present

## 2019-05-18 DIAGNOSIS — G9340 Encephalopathy, unspecified: Secondary | ICD-10-CM | POA: Diagnosis not present

## 2019-05-18 DIAGNOSIS — K7460 Unspecified cirrhosis of liver: Secondary | ICD-10-CM | POA: Diagnosis not present

## 2019-05-18 DIAGNOSIS — E1140 Type 2 diabetes mellitus with diabetic neuropathy, unspecified: Secondary | ICD-10-CM | POA: Diagnosis not present

## 2019-05-18 DIAGNOSIS — I69351 Hemiplegia and hemiparesis following cerebral infarction affecting right dominant side: Secondary | ICD-10-CM | POA: Diagnosis not present

## 2019-05-18 DIAGNOSIS — G4089 Other seizures: Secondary | ICD-10-CM | POA: Diagnosis not present

## 2019-05-22 DIAGNOSIS — F330 Major depressive disorder, recurrent, mild: Secondary | ICD-10-CM | POA: Diagnosis not present

## 2019-05-23 DIAGNOSIS — I69351 Hemiplegia and hemiparesis following cerebral infarction affecting right dominant side: Secondary | ICD-10-CM | POA: Diagnosis not present

## 2019-05-23 DIAGNOSIS — K7460 Unspecified cirrhosis of liver: Secondary | ICD-10-CM | POA: Diagnosis not present

## 2019-05-23 DIAGNOSIS — F419 Anxiety disorder, unspecified: Secondary | ICD-10-CM | POA: Diagnosis not present

## 2019-05-23 DIAGNOSIS — M170 Bilateral primary osteoarthritis of knee: Secondary | ICD-10-CM | POA: Diagnosis not present

## 2019-05-23 DIAGNOSIS — G9340 Encephalopathy, unspecified: Secondary | ICD-10-CM | POA: Diagnosis not present

## 2019-05-23 DIAGNOSIS — I5030 Unspecified diastolic (congestive) heart failure: Secondary | ICD-10-CM | POA: Diagnosis not present

## 2019-05-23 DIAGNOSIS — G4089 Other seizures: Secondary | ICD-10-CM | POA: Diagnosis not present

## 2019-05-23 DIAGNOSIS — I110 Hypertensive heart disease with heart failure: Secondary | ICD-10-CM | POA: Diagnosis not present

## 2019-05-23 DIAGNOSIS — E1140 Type 2 diabetes mellitus with diabetic neuropathy, unspecified: Secondary | ICD-10-CM | POA: Diagnosis not present

## 2019-05-24 DIAGNOSIS — E1140 Type 2 diabetes mellitus with diabetic neuropathy, unspecified: Secondary | ICD-10-CM | POA: Diagnosis not present

## 2019-05-24 DIAGNOSIS — G9340 Encephalopathy, unspecified: Secondary | ICD-10-CM | POA: Diagnosis not present

## 2019-05-24 DIAGNOSIS — I5030 Unspecified diastolic (congestive) heart failure: Secondary | ICD-10-CM | POA: Diagnosis not present

## 2019-05-24 DIAGNOSIS — M170 Bilateral primary osteoarthritis of knee: Secondary | ICD-10-CM | POA: Diagnosis not present

## 2019-05-24 DIAGNOSIS — I110 Hypertensive heart disease with heart failure: Secondary | ICD-10-CM | POA: Diagnosis not present

## 2019-05-24 DIAGNOSIS — F419 Anxiety disorder, unspecified: Secondary | ICD-10-CM | POA: Diagnosis not present

## 2019-05-24 DIAGNOSIS — K7460 Unspecified cirrhosis of liver: Secondary | ICD-10-CM | POA: Diagnosis not present

## 2019-05-24 DIAGNOSIS — G4089 Other seizures: Secondary | ICD-10-CM | POA: Diagnosis not present

## 2019-05-24 DIAGNOSIS — I69351 Hemiplegia and hemiparesis following cerebral infarction affecting right dominant side: Secondary | ICD-10-CM | POA: Diagnosis not present

## 2019-05-29 DIAGNOSIS — F419 Anxiety disorder, unspecified: Secondary | ICD-10-CM | POA: Diagnosis not present

## 2019-05-29 DIAGNOSIS — K7460 Unspecified cirrhosis of liver: Secondary | ICD-10-CM | POA: Diagnosis not present

## 2019-05-29 DIAGNOSIS — M170 Bilateral primary osteoarthritis of knee: Secondary | ICD-10-CM | POA: Diagnosis not present

## 2019-05-29 DIAGNOSIS — I69351 Hemiplegia and hemiparesis following cerebral infarction affecting right dominant side: Secondary | ICD-10-CM | POA: Diagnosis not present

## 2019-05-29 DIAGNOSIS — I110 Hypertensive heart disease with heart failure: Secondary | ICD-10-CM | POA: Diagnosis not present

## 2019-05-29 DIAGNOSIS — I5030 Unspecified diastolic (congestive) heart failure: Secondary | ICD-10-CM | POA: Diagnosis not present

## 2019-05-29 DIAGNOSIS — E1140 Type 2 diabetes mellitus with diabetic neuropathy, unspecified: Secondary | ICD-10-CM | POA: Diagnosis not present

## 2019-05-29 DIAGNOSIS — G4089 Other seizures: Secondary | ICD-10-CM | POA: Diagnosis not present

## 2019-05-29 DIAGNOSIS — G9340 Encephalopathy, unspecified: Secondary | ICD-10-CM | POA: Diagnosis not present

## 2019-05-30 DIAGNOSIS — K7460 Unspecified cirrhosis of liver: Secondary | ICD-10-CM | POA: Diagnosis not present

## 2019-05-30 DIAGNOSIS — I5030 Unspecified diastolic (congestive) heart failure: Secondary | ICD-10-CM | POA: Diagnosis not present

## 2019-05-30 DIAGNOSIS — M170 Bilateral primary osteoarthritis of knee: Secondary | ICD-10-CM | POA: Diagnosis not present

## 2019-05-30 DIAGNOSIS — I69351 Hemiplegia and hemiparesis following cerebral infarction affecting right dominant side: Secondary | ICD-10-CM | POA: Diagnosis not present

## 2019-05-30 DIAGNOSIS — E1140 Type 2 diabetes mellitus with diabetic neuropathy, unspecified: Secondary | ICD-10-CM | POA: Diagnosis not present

## 2019-05-30 DIAGNOSIS — F419 Anxiety disorder, unspecified: Secondary | ICD-10-CM | POA: Diagnosis not present

## 2019-05-30 DIAGNOSIS — I110 Hypertensive heart disease with heart failure: Secondary | ICD-10-CM | POA: Diagnosis not present

## 2019-05-30 DIAGNOSIS — G4089 Other seizures: Secondary | ICD-10-CM | POA: Diagnosis not present

## 2019-05-30 DIAGNOSIS — G9340 Encephalopathy, unspecified: Secondary | ICD-10-CM | POA: Diagnosis not present

## 2019-06-05 DIAGNOSIS — G9340 Encephalopathy, unspecified: Secondary | ICD-10-CM | POA: Diagnosis not present

## 2019-06-05 DIAGNOSIS — K7460 Unspecified cirrhosis of liver: Secondary | ICD-10-CM | POA: Diagnosis not present

## 2019-06-05 DIAGNOSIS — M170 Bilateral primary osteoarthritis of knee: Secondary | ICD-10-CM | POA: Diagnosis not present

## 2019-06-05 DIAGNOSIS — I110 Hypertensive heart disease with heart failure: Secondary | ICD-10-CM | POA: Diagnosis not present

## 2019-06-05 DIAGNOSIS — G4089 Other seizures: Secondary | ICD-10-CM | POA: Diagnosis not present

## 2019-06-05 DIAGNOSIS — I5030 Unspecified diastolic (congestive) heart failure: Secondary | ICD-10-CM | POA: Diagnosis not present

## 2019-06-05 DIAGNOSIS — F419 Anxiety disorder, unspecified: Secondary | ICD-10-CM | POA: Diagnosis not present

## 2019-06-05 DIAGNOSIS — E1140 Type 2 diabetes mellitus with diabetic neuropathy, unspecified: Secondary | ICD-10-CM | POA: Diagnosis not present

## 2019-06-05 DIAGNOSIS — I69351 Hemiplegia and hemiparesis following cerebral infarction affecting right dominant side: Secondary | ICD-10-CM | POA: Diagnosis not present

## 2019-06-12 DIAGNOSIS — I110 Hypertensive heart disease with heart failure: Secondary | ICD-10-CM | POA: Diagnosis not present

## 2019-06-12 DIAGNOSIS — I69351 Hemiplegia and hemiparesis following cerebral infarction affecting right dominant side: Secondary | ICD-10-CM | POA: Diagnosis not present

## 2019-06-12 DIAGNOSIS — M170 Bilateral primary osteoarthritis of knee: Secondary | ICD-10-CM | POA: Diagnosis not present

## 2019-06-12 DIAGNOSIS — E1140 Type 2 diabetes mellitus with diabetic neuropathy, unspecified: Secondary | ICD-10-CM | POA: Diagnosis not present

## 2019-06-12 DIAGNOSIS — F419 Anxiety disorder, unspecified: Secondary | ICD-10-CM | POA: Diagnosis not present

## 2019-06-12 DIAGNOSIS — I5030 Unspecified diastolic (congestive) heart failure: Secondary | ICD-10-CM | POA: Diagnosis not present

## 2019-06-12 DIAGNOSIS — G4089 Other seizures: Secondary | ICD-10-CM | POA: Diagnosis not present

## 2019-06-12 DIAGNOSIS — G9340 Encephalopathy, unspecified: Secondary | ICD-10-CM | POA: Diagnosis not present

## 2019-06-12 DIAGNOSIS — K7460 Unspecified cirrhosis of liver: Secondary | ICD-10-CM | POA: Diagnosis not present

## 2019-06-13 DIAGNOSIS — G9340 Encephalopathy, unspecified: Secondary | ICD-10-CM | POA: Diagnosis not present

## 2019-06-13 DIAGNOSIS — I69351 Hemiplegia and hemiparesis following cerebral infarction affecting right dominant side: Secondary | ICD-10-CM | POA: Diagnosis not present

## 2019-06-13 DIAGNOSIS — E1140 Type 2 diabetes mellitus with diabetic neuropathy, unspecified: Secondary | ICD-10-CM | POA: Diagnosis not present

## 2019-06-13 DIAGNOSIS — G4089 Other seizures: Secondary | ICD-10-CM | POA: Diagnosis not present

## 2019-06-13 DIAGNOSIS — M170 Bilateral primary osteoarthritis of knee: Secondary | ICD-10-CM | POA: Diagnosis not present

## 2019-06-13 DIAGNOSIS — I110 Hypertensive heart disease with heart failure: Secondary | ICD-10-CM | POA: Diagnosis not present

## 2019-06-13 DIAGNOSIS — I5030 Unspecified diastolic (congestive) heart failure: Secondary | ICD-10-CM | POA: Diagnosis not present

## 2019-06-13 DIAGNOSIS — K7460 Unspecified cirrhosis of liver: Secondary | ICD-10-CM | POA: Diagnosis not present

## 2019-06-13 DIAGNOSIS — F419 Anxiety disorder, unspecified: Secondary | ICD-10-CM | POA: Diagnosis not present

## 2019-06-19 DIAGNOSIS — G4089 Other seizures: Secondary | ICD-10-CM | POA: Diagnosis not present

## 2019-06-19 DIAGNOSIS — I69351 Hemiplegia and hemiparesis following cerebral infarction affecting right dominant side: Secondary | ICD-10-CM | POA: Diagnosis not present

## 2019-06-19 DIAGNOSIS — I5030 Unspecified diastolic (congestive) heart failure: Secondary | ICD-10-CM | POA: Diagnosis not present

## 2019-06-19 DIAGNOSIS — M170 Bilateral primary osteoarthritis of knee: Secondary | ICD-10-CM | POA: Diagnosis not present

## 2019-06-19 DIAGNOSIS — G9340 Encephalopathy, unspecified: Secondary | ICD-10-CM | POA: Diagnosis not present

## 2019-06-19 DIAGNOSIS — I110 Hypertensive heart disease with heart failure: Secondary | ICD-10-CM | POA: Diagnosis not present

## 2019-06-19 DIAGNOSIS — E1140 Type 2 diabetes mellitus with diabetic neuropathy, unspecified: Secondary | ICD-10-CM | POA: Diagnosis not present

## 2019-06-19 DIAGNOSIS — K7460 Unspecified cirrhosis of liver: Secondary | ICD-10-CM | POA: Diagnosis not present

## 2019-06-19 DIAGNOSIS — F419 Anxiety disorder, unspecified: Secondary | ICD-10-CM | POA: Diagnosis not present

## 2019-08-01 ENCOUNTER — Emergency Department (HOSPITAL_COMMUNITY): Payer: Medicare PPO

## 2019-08-01 ENCOUNTER — Other Ambulatory Visit: Payer: Self-pay

## 2019-08-01 ENCOUNTER — Emergency Department (HOSPITAL_COMMUNITY)
Admission: EM | Admit: 2019-08-01 | Discharge: 2019-08-01 | Disposition: A | Discharge status: Home / Self Care | Payer: Medicare PPO | Attending: Emergency Medicine | Admitting: Emergency Medicine

## 2019-08-01 ENCOUNTER — Encounter (HOSPITAL_COMMUNITY): Payer: Self-pay

## 2019-08-01 DIAGNOSIS — Y939 Activity, unspecified: Secondary | ICD-10-CM | POA: Diagnosis not present

## 2019-08-01 DIAGNOSIS — I110 Hypertensive heart disease with heart failure: Secondary | ICD-10-CM | POA: Diagnosis not present

## 2019-08-01 DIAGNOSIS — N3090 Cystitis, unspecified without hematuria: Secondary | ICD-10-CM | POA: Insufficient documentation

## 2019-08-01 DIAGNOSIS — E1165 Type 2 diabetes mellitus with hyperglycemia: Secondary | ICD-10-CM | POA: Diagnosis not present

## 2019-08-01 DIAGNOSIS — R0602 Shortness of breath: Secondary | ICD-10-CM | POA: Diagnosis not present

## 2019-08-01 DIAGNOSIS — I10 Essential (primary) hypertension: Secondary | ICD-10-CM | POA: Diagnosis not present

## 2019-08-01 DIAGNOSIS — Z794 Long term (current) use of insulin: Secondary | ICD-10-CM | POA: Insufficient documentation

## 2019-08-01 DIAGNOSIS — X58XXXA Exposure to other specified factors, initial encounter: Secondary | ICD-10-CM | POA: Insufficient documentation

## 2019-08-01 DIAGNOSIS — Y929 Unspecified place or not applicable: Secondary | ICD-10-CM | POA: Insufficient documentation

## 2019-08-01 DIAGNOSIS — S39012A Strain of muscle, fascia and tendon of lower back, initial encounter: Secondary | ICD-10-CM | POA: Insufficient documentation

## 2019-08-01 DIAGNOSIS — Z0000 Encounter for general adult medical examination without abnormal findings: Secondary | ICD-10-CM | POA: Diagnosis not present

## 2019-08-01 DIAGNOSIS — Y999 Unspecified external cause status: Secondary | ICD-10-CM | POA: Diagnosis not present

## 2019-08-01 DIAGNOSIS — I209 Angina pectoris, unspecified: Secondary | ICD-10-CM | POA: Diagnosis not present

## 2019-08-01 DIAGNOSIS — N644 Mastodynia: Secondary | ICD-10-CM | POA: Insufficient documentation

## 2019-08-01 DIAGNOSIS — F17210 Nicotine dependence, cigarettes, uncomplicated: Secondary | ICD-10-CM | POA: Insufficient documentation

## 2019-08-01 DIAGNOSIS — I25118 Atherosclerotic heart disease of native coronary artery with other forms of angina pectoris: Secondary | ICD-10-CM | POA: Diagnosis not present

## 2019-08-01 DIAGNOSIS — Z008 Encounter for other general examination: Secondary | ICD-10-CM | POA: Diagnosis not present

## 2019-08-01 DIAGNOSIS — J40 Bronchitis, not specified as acute or chronic: Secondary | ICD-10-CM | POA: Diagnosis not present

## 2019-08-01 DIAGNOSIS — I5032 Chronic diastolic (congestive) heart failure: Secondary | ICD-10-CM | POA: Insufficient documentation

## 2019-08-01 DIAGNOSIS — Z79899 Other long term (current) drug therapy: Secondary | ICD-10-CM | POA: Diagnosis not present

## 2019-08-01 DIAGNOSIS — R0789 Other chest pain: Secondary | ICD-10-CM | POA: Diagnosis not present

## 2019-08-01 DIAGNOSIS — R079 Chest pain, unspecified: Secondary | ICD-10-CM | POA: Diagnosis not present

## 2019-08-01 LAB — BASIC METABOLIC PANEL
Anion gap: 10 (ref 5–15)
BUN: 11 mg/dL (ref 8–23)
CO2: 29 mmol/L (ref 22–32)
Calcium: 8.8 mg/dL — ABNORMAL LOW (ref 8.9–10.3)
Chloride: 100 mmol/L (ref 98–111)
Creatinine, Ser: 1.04 mg/dL — ABNORMAL HIGH (ref 0.44–1.00)
GFR calc Af Amer: >60 mL/min (ref >60)
GFR calc non Af Amer: 52 mL/min — ABNORMAL LOW (ref >60)
Glucose, Bld: 242 mg/dL — ABNORMAL HIGH (ref 70–99)
Potassium: 4.2 mmol/L (ref 3.5–5.1)
Sodium: 139 mmol/L (ref 135–145)

## 2019-08-01 LAB — URINALYSIS, ROUTINE W REFLEX MICROSCOPIC
Bilirubin Urine: NEGATIVE
Glucose, UA: NEGATIVE mg/dL
Ketones, ur: NEGATIVE mg/dL
Leukocytes,Ua: NEGATIVE
Nitrite: POSITIVE — AB
Protein, ur: >=300 mg/dL — AB
Specific Gravity, Urine: 1.028 (ref 1.005–1.030)
pH: 5 (ref 5.0–8.0)

## 2019-08-01 LAB — HEPATIC FUNCTION PANEL
ALT: 14 U/L (ref 0–44)
AST: 16 U/L (ref 15–41)
Albumin: 3 g/dL — ABNORMAL LOW (ref 3.5–5.0)
Alkaline Phosphatase: 104 U/L (ref 38–126)
Bilirubin, Direct: 0.1 mg/dL (ref 0.0–0.2)
Indirect Bilirubin: 0.5 mg/dL (ref 0.3–0.9)
Total Bilirubin: 0.6 mg/dL (ref 0.3–1.2)
Total Protein: 6.7 g/dL (ref 6.5–8.1)

## 2019-08-01 LAB — CBC
HCT: 42.6 % (ref 36.0–46.0)
Hemoglobin: 13.3 g/dL (ref 12.0–15.0)
MCH: 31.9 pg (ref 26.0–34.0)
MCHC: 31.2 g/dL (ref 30.0–36.0)
MCV: 102.2 fL — ABNORMAL HIGH (ref 80.0–100.0)
Platelets: 195 10*3/uL (ref 150–400)
RBC: 4.17 MIL/uL (ref 3.87–5.11)
RDW: 13.3 % (ref 11.5–15.5)
WBC: 4.5 10*3/uL (ref 4.0–10.5)
nRBC: 0 % (ref 0.0–0.2)

## 2019-08-01 LAB — TROPONIN I (HIGH SENSITIVITY)
Troponin I (High Sensitivity): 5 ng/L (ref <18)
Troponin I (High Sensitivity): 4 ng/L (ref <18)

## 2019-08-01 LAB — CBG MONITORING, ED: Glucose-Capillary: 191 mg/dL — ABNORMAL HIGH (ref 70–99)

## 2019-08-01 MED ORDER — AEROCHAMBER PLUS FLO-VU LARGE MISC
1.0000 | Freq: Once | Status: AC
  Administered 2019-08-01: 1

## 2019-08-01 MED ORDER — LIDOCAINE 5 % EX PTCH
1.0000 | MEDICATED_PATCH | CUTANEOUS | Status: DC
  Administered 2019-08-01: 1 via TRANSDERMAL
  Filled 2019-08-01: qty 1

## 2019-08-01 MED ORDER — CEPHALEXIN 500 MG PO CAPS: 500.0000 mg | ORAL_CAPSULE | Freq: Four times a day (QID) | ORAL | 0 refills | Status: AC

## 2019-08-01 MED ORDER — ALBUTEROL SULFATE HFA 108 (90 BASE) MCG/ACT IN AERS
2.0000 | INHALATION_SPRAY | Freq: Once | RESPIRATORY_TRACT | Status: AC
  Administered 2019-08-01: 16:00:00 2 via RESPIRATORY_TRACT
  Filled 2019-08-01: qty 6.7

## 2019-08-01 NOTE — ED Notes (Signed)
The pt reports having a cold for 2 weeks  She has had chest pain and extremity swelling  For 2 weeks  She fell several days ago also  She [facility] and sent here for treatment  aand o x3

## 2019-08-01 NOTE — ED Triage Notes (Signed)
Pt from [facility] with em[facility] for c.o chest pain and lower back pain for the past few days, pt denies cough or SOB. Pt a.o, given 324 ASA at UC and had a negative rapid COVID test today

## 2019-08-01 NOTE — ED Notes (Signed)
The pt is spitting up white mucous

## 2019-08-01 NOTE — ED Notes (Signed)
The pt wants to go home  She reports that she has not eaten all day and no body has told her anything she wants food

## 2019-08-01 NOTE — ED Notes (Signed)
bp elevated she takes medicine but her bp is not controlled

## 2019-08-01 NOTE — ED Notes (Signed)
One unsuccessful attempt to  Get an iv x 1  Pt asking for iv team

## 2019-08-01 NOTE — Discharge Instructions (Addendum)
Per our conversation, you are being prescribed Keflex which is a antibiotic used to treat your urinary tract infection.  Please make sure to take this antibiotic in its entirety.  You can apply icy hot and Biofreeze as needed to your lower back for pain management.  You can also continue to take ibuprofen and Tylenol for pain.  Please do not exceed more than 4 g of Tylenol in a day.  Also, please be sure to follow-up with your primary care provider regarding this visit to reassess you for your breast pain and your UTI.  Please do not hesitate to return to the emergency department with any new or worsening symptoms.  It was a pleasure to meet you.

## 2019-08-01 NOTE — ED Notes (Signed)
Meal given

## 2019-08-02 LAB — URINE CULTURE
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
## 2019-08-07 DIAGNOSIS — J449 Chronic obstructive pulmonary disease, unspecified: Secondary | ICD-10-CM | POA: Diagnosis not present

## 2019-08-07 DIAGNOSIS — E785 Hyperlipidemia, unspecified: Secondary | ICD-10-CM | POA: Diagnosis not present

## 2019-08-07 DIAGNOSIS — I130 Hypertensive heart and chronic kidney disease with heart failure and stage 1 through stage 4 chronic kidney disease, or unspecified chronic kidney disease: Secondary | ICD-10-CM | POA: Diagnosis not present

## 2019-08-07 DIAGNOSIS — Z0001 Encounter for general adult medical examination with abnormal findings: Secondary | ICD-10-CM | POA: Diagnosis not present

## 2019-08-07 DIAGNOSIS — L97509 Non-pressure chronic ulcer of other part of unspecified foot with unspecified severity: Secondary | ICD-10-CM | POA: Diagnosis not present

## 2019-08-07 DIAGNOSIS — Z008 Encounter for other general examination: Secondary | ICD-10-CM | POA: Diagnosis not present

## 2019-08-07 DIAGNOSIS — I700 Atherosclerosis of aorta: Secondary | ICD-10-CM | POA: Diagnosis not present

## 2019-08-07 DIAGNOSIS — Z79899 Other long term (current) drug therapy: Secondary | ICD-10-CM | POA: Diagnosis not present

## 2019-08-07 DIAGNOSIS — E11621 Type 2 diabetes mellitus with foot ulcer: Secondary | ICD-10-CM | POA: Diagnosis not present

## 2019-08-08 DIAGNOSIS — Z79899 Other long term (current) drug therapy: Secondary | ICD-10-CM | POA: Diagnosis not present

## 2019-08-14 ENCOUNTER — Other Ambulatory Visit: Payer: Self-pay | Admitting: Endocrinology

## 2019-08-14 DIAGNOSIS — Z1231 Encounter for screening mammogram for malignant neoplasm of breast: Secondary | ICD-10-CM

## 2019-08-17 DIAGNOSIS — I6789 Other cerebrovascular disease: Secondary | ICD-10-CM | POA: Diagnosis not present

## 2019-08-17 DIAGNOSIS — N179 Acute kidney failure, unspecified: Secondary | ICD-10-CM | POA: Diagnosis not present

## 2019-08-22 ENCOUNTER — Ambulatory Visit (INDEPENDENT_AMBULATORY_CARE_PROVIDER_SITE_OTHER): Payer: Medicare Other | Admitting: Podiatry

## 2019-08-22 ENCOUNTER — Other Ambulatory Visit: Payer: Self-pay

## 2019-08-22 DIAGNOSIS — L97512 Non-pressure chronic ulcer of other part of right foot with fat layer exposed: Secondary | ICD-10-CM

## 2019-08-22 DIAGNOSIS — B351 Tinea unguium: Secondary | ICD-10-CM

## 2019-08-22 DIAGNOSIS — M79676 Pain in unspecified toe(s): Secondary | ICD-10-CM

## 2019-08-22 DIAGNOSIS — E0843 Diabetes mellitus due to underlying condition with diabetic autonomic (poly)neuropathy: Secondary | ICD-10-CM | POA: Diagnosis not present

## 2019-08-22 DIAGNOSIS — I739 Peripheral vascular disease, unspecified: Secondary | ICD-10-CM

## 2019-08-23 ENCOUNTER — Ambulatory Visit: Payer: Medicare PPO

## 2019-08-27 ENCOUNTER — Ambulatory Visit: Payer: Medicare PPO

## 2019-08-28 ENCOUNTER — Ambulatory Visit: Payer: Medicare PPO

## 2019-08-28 NOTE — Progress Notes (Signed)
SUBJECTIVE Patient with a history of diabetes mellitus presents to office today complaining of elongated, thickened nails that cause pain while ambulating in shoes. She is unable to trim her own nails.  She also notes a wound to the right fourth toe that appeared a few weeks ago. She has not had any treatment for the area. There are no modifying factors noted. Patient is here for further evaluation and treatment.   Past Medical History:  Diagnosis Date  . ALLERGIC RHINITIS 10/29/2006  . Arthritis    "hands, feet, legs, arms" (11/22/2012)  . ASYMPTOMATIC POSTMENOPAUSAL STATUS 02/22/2008  . 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 CHEST PAIN 02/22/2008   LHC in 7/05 and 1/11: normal; Myoview in 11/10 that demonstrated an EF of 51% and slight reversible anterior and septal defect of borderline significance (false + test);  Echo 04/2009:  Mild LVH, EF 55-60%, Gr 1 DD, MAC.    [name] CHF (congestive heart failure) ([place])   . Cholelithiasis   . Chronic back pain   . CIRRHOSIS 10/29/2006  . COLONIC POLYPS 09/13/2007  . COPD 04/22/2009  . DEGENERATIVE JOINT DISEASE 06/15/2007  . DEPRESSION 02/22/2008  . Depression   . Diabetes mellitus ([place])   . Diverticulosis   . Fatty liver   . Gastritis   . Gastroparesis   . GERD 10/29/2006  . Headache(784.0)    "not everyday now" (11/22/2012)  . Heart failure with preserved ejection fraction ([place])   . Hepatic cirrhosis ([place])   . HYPERCHOLESTEROLEMIA 02/25/2009  . Hyperlipidemia   . HYPERTENSION 12/20/2008  . Hypertension   . Migraine   . Myocardial infarction ([place])   . Neuropathy   . Obesity   . OSA (obstructive sleep apnea)    "quit wearing my CPAP" (11/22/2012)  . OSTEOPOROSIS 10/29/2006  . SEIZURE DISORDER    "silent seizures" (11/22/2012)  . Shortness of breath    "off and on" (11/22/2012)  . Stroke [facility]) 04/2004   Jerrol Banana 04/22/2004; "still weaker on the right" (11/22/2012)  . Stroke ([place])   . Type II diabetes mellitus ([place])      OBJECTIVE General Patient is awake, alert, and oriented x 3 and in no acute distress.  Derm Skin is dry and supple bilateral. Nails are tender, long, thickened and dystrophic with subungual debris, consistent with onychomycosis, 1-5 bilateral. No signs of infection noted. Wound #1 noted to the right fourth toe measuring approximately 0.6 x 0.6 x 0.3 cm.   To the above-noted ulceration, there is no eschar. There is a moderate amount of slough, fibrin and necrotic tissue. Granulation tissue and wound base is red. There is no malodor. There is a minimal amount of serosanginous drainage noted. Periwound integrity is intact.   Vasc  DP and PT pedal pulses nonpalpable bilaterally. Skin is cool to the touch.   Neuro Epicritic and protective threshold sensation diminished bilaterally.   Musculoskeletal Exam No symptomatic pedal deformities noted bilateral. Muscular strength within normal limits.  ASSESSMENT 1. Diabetes Mellitus w/ peripheral neuropathy 2. Onychomycosis of nail due to dermatophyte bilateral 3. Ulceration of the right fourth toe secondary to diabetes mellitus  4. PVD bilateral   PLAN OF CARE 1. Patient evaluated today. 2. Instructed to maintain good pedal hygiene and foot care. Stressed importance of controlling blood sugar.  3. Mechanical debridement of nails 1-5 bilaterally performed using a nail nipper. Filed with dremel without incident.  4. Medically necessary excisional debridement including subcutaneous tissue was performed using a tissue nipper and  a chisel blade. Excisional debridement of all the necrotic nonviable tissue down to healthy bleeding viable tissue was performed with post-debridement measurements same as pre-. 5. The wound was cleansed and dry sterile dressing applied. 6. Recommended antibiotic ointment daily with a bandage.  7. Patient's last arterial dopplers were in July 2020 and were within normal limits.  8. Return to clinic in 3 mos.     Edrick Kins, DPM [facility]  Dr. Edrick Kins, [address]                                        [place], [place] [address]                Office [phone]  Fax [phone]

## 2019-09-12 ENCOUNTER — Telehealth: Payer: Self-pay | Admitting: Endocrinology

## 2019-09-12 NOTE — Telephone Encounter (Signed)
[name] with [employer]  and [place] requests to be called at ph# [phone], ext [phone] re: CMN for oxygen

## 2019-09-12 NOTE — Telephone Encounter (Signed)
Returned Wanda's call and informed her that this is not something we manage. Must call PCP for these orders and CMN. No further action required from this office

## 2019-10-08 ENCOUNTER — Ambulatory Visit: Payer: Medicare PPO | Admitting: Podiatry

## 2019-11-05 ENCOUNTER — Encounter (INDEPENDENT_AMBULATORY_CARE_PROVIDER_SITE_OTHER): Payer: Medicare Other | Admitting: Ophthalmology

## 2019-11-06 ENCOUNTER — Other Ambulatory Visit: Payer: Self-pay

## 2019-11-06 MED ORDER — BASAGLAR KWIKPEN 100 UNIT/ML ~~LOC~~ SOPN: 120.0000 [IU] | PEN_INJECTOR | Freq: Every day | SUBCUTANEOUS | 2 refills | Status: DC

## 2019-11-13 ENCOUNTER — Telehealth: Payer: Self-pay | Admitting: *Deleted

## 2019-11-13 NOTE — Telephone Encounter (Signed)
A detailed message was left, re: new patient appointment.

## 2019-11-22 ENCOUNTER — Other Ambulatory Visit: Payer: Self-pay | Admitting: Family

## 2019-11-22 ENCOUNTER — Other Ambulatory Visit: Payer: Self-pay | Admitting: Endocrinology

## 2019-11-22 DIAGNOSIS — Z1231 Encounter for screening mammogram for malignant neoplasm of breast: Secondary | ICD-10-CM

## 2019-11-28 ENCOUNTER — Other Ambulatory Visit: Payer: Self-pay | Admitting: Family

## 2019-11-28 DIAGNOSIS — N644 Mastodynia: Secondary | ICD-10-CM

## 2019-12-03 ENCOUNTER — Ambulatory Visit: Payer: Medicare Other | Admitting: Cardiology

## 2019-12-05 ENCOUNTER — Other Ambulatory Visit: Payer: Self-pay

## 2019-12-05 ENCOUNTER — Encounter (INDEPENDENT_AMBULATORY_CARE_PROVIDER_SITE_OTHER): Payer: Medicare Other | Admitting: Ophthalmology

## 2019-12-14 ENCOUNTER — Encounter (INDEPENDENT_AMBULATORY_CARE_PROVIDER_SITE_OTHER): Payer: Medicare Other | Admitting: Ophthalmology

## 2019-12-17 ENCOUNTER — Other Ambulatory Visit: Payer: Self-pay

## 2019-12-17 ENCOUNTER — Ambulatory Visit
Admission: RE | Admit: 2019-12-17 | Discharge: 2019-12-17 | Disposition: A | Discharge status: Home / Self Care | Payer: Medicare Other | Source: Ambulatory Visit | Attending: Family | Admitting: Family

## 2019-12-17 DIAGNOSIS — N644 Mastodynia: Secondary | ICD-10-CM

## 2019-12-27 ENCOUNTER — Encounter (INDEPENDENT_AMBULATORY_CARE_PROVIDER_SITE_OTHER): Payer: Medicare Other | Admitting: Ophthalmology

## 2019-12-27 ENCOUNTER — Other Ambulatory Visit: Payer: Self-pay

## 2019-12-27 DIAGNOSIS — E103412 Type 1 diabetes mellitus with severe nonproliferative diabetic retinopathy with macular edema, left eye: Secondary | ICD-10-CM | POA: Diagnosis not present

## 2019-12-27 DIAGNOSIS — H35033 Hypertensive retinopathy, bilateral: Secondary | ICD-10-CM

## 2019-12-27 DIAGNOSIS — E103311 Type 1 diabetes mellitus with moderate nonproliferative diabetic retinopathy with macular edema, right eye: Secondary | ICD-10-CM

## 2019-12-27 DIAGNOSIS — E10311 Type 1 diabetes mellitus with unspecified diabetic retinopathy with macular edema: Secondary | ICD-10-CM | POA: Diagnosis not present

## 2019-12-27 DIAGNOSIS — H43813 Vitreous degeneration, bilateral: Secondary | ICD-10-CM

## 2019-12-27 DIAGNOSIS — I10 Essential (primary) hypertension: Secondary | ICD-10-CM | POA: Diagnosis not present

## 2020-01-02 ENCOUNTER — Ambulatory Visit: Payer: Medicare Other | Admitting: Cardiovascular Disease

## 2020-01-22 ENCOUNTER — Other Ambulatory Visit: Payer: Self-pay

## 2020-01-22 ENCOUNTER — Encounter (INDEPENDENT_AMBULATORY_CARE_PROVIDER_SITE_OTHER): Payer: Medicare Other | Admitting: Ophthalmology

## 2020-01-22 DIAGNOSIS — E11311 Type 2 diabetes mellitus with unspecified diabetic retinopathy with macular edema: Secondary | ICD-10-CM | POA: Diagnosis not present

## 2020-01-22 DIAGNOSIS — E113412 Type 2 diabetes mellitus with severe nonproliferative diabetic retinopathy with macular edema, left eye: Secondary | ICD-10-CM | POA: Diagnosis not present

## 2020-01-22 DIAGNOSIS — E113311 Type 2 diabetes mellitus with moderate nonproliferative diabetic retinopathy with macular edema, right eye: Secondary | ICD-10-CM | POA: Diagnosis not present

## 2020-01-22 DIAGNOSIS — H43813 Vitreous degeneration, bilateral: Secondary | ICD-10-CM

## 2020-01-22 DIAGNOSIS — I10 Essential (primary) hypertension: Secondary | ICD-10-CM

## 2020-01-22 DIAGNOSIS — H35033 Hypertensive retinopathy, bilateral: Secondary | ICD-10-CM

## 2020-01-23 ENCOUNTER — Other Ambulatory Visit: Payer: Self-pay | Admitting: Endocrinology

## 2020-02-06 ENCOUNTER — Ambulatory Visit: Payer: Medicare Other | Admitting: Cardiovascular Disease

## 2020-02-20 ENCOUNTER — Encounter (INDEPENDENT_AMBULATORY_CARE_PROVIDER_SITE_OTHER): Payer: Medicare Other | Admitting: Ophthalmology

## 2020-02-20 ENCOUNTER — Other Ambulatory Visit: Payer: Self-pay

## 2020-02-20 DIAGNOSIS — E113412 Type 2 diabetes mellitus with severe nonproliferative diabetic retinopathy with macular edema, left eye: Secondary | ICD-10-CM | POA: Diagnosis not present

## 2020-02-20 DIAGNOSIS — I10 Essential (primary) hypertension: Secondary | ICD-10-CM | POA: Diagnosis not present

## 2020-02-20 DIAGNOSIS — H43813 Vitreous degeneration, bilateral: Secondary | ICD-10-CM

## 2020-02-20 DIAGNOSIS — H35033 Hypertensive retinopathy, bilateral: Secondary | ICD-10-CM

## 2020-02-20 DIAGNOSIS — E11311 Type 2 diabetes mellitus with unspecified diabetic retinopathy with macular edema: Secondary | ICD-10-CM

## 2020-02-20 DIAGNOSIS — E113311 Type 2 diabetes mellitus with moderate nonproliferative diabetic retinopathy with macular edema, right eye: Secondary | ICD-10-CM | POA: Diagnosis not present

## 2020-03-19 ENCOUNTER — Encounter (INDEPENDENT_AMBULATORY_CARE_PROVIDER_SITE_OTHER): Payer: Medicare Other | Admitting: Ophthalmology

## 2020-03-25 ENCOUNTER — Encounter (INDEPENDENT_AMBULATORY_CARE_PROVIDER_SITE_OTHER): Payer: Medicare Other | Admitting: Ophthalmology

## 2020-03-25 ENCOUNTER — Other Ambulatory Visit: Payer: Self-pay

## 2020-03-25 DIAGNOSIS — I10 Essential (primary) hypertension: Secondary | ICD-10-CM | POA: Diagnosis not present

## 2020-03-25 DIAGNOSIS — E11311 Type 2 diabetes mellitus with unspecified diabetic retinopathy with macular edema: Secondary | ICD-10-CM

## 2020-03-25 DIAGNOSIS — E113311 Type 2 diabetes mellitus with moderate nonproliferative diabetic retinopathy with macular edema, right eye: Secondary | ICD-10-CM | POA: Diagnosis not present

## 2020-03-25 DIAGNOSIS — E113412 Type 2 diabetes mellitus with severe nonproliferative diabetic retinopathy with macular edema, left eye: Secondary | ICD-10-CM | POA: Diagnosis not present

## 2020-03-25 DIAGNOSIS — H43813 Vitreous degeneration, bilateral: Secondary | ICD-10-CM

## 2020-03-25 DIAGNOSIS — H35033 Hypertensive retinopathy, bilateral: Secondary | ICD-10-CM

## 2020-04-02 ENCOUNTER — Emergency Department (HOSPITAL_COMMUNITY)
Admission: EM | Admit: 2020-04-02 | Discharge: 2020-04-02 | Disposition: A | Discharge status: Home / Self Care | Payer: Medicare Other | Attending: Emergency Medicine | Admitting: Emergency Medicine

## 2020-04-02 DIAGNOSIS — N3000 Acute cystitis without hematuria: Secondary | ICD-10-CM

## 2020-04-02 DIAGNOSIS — Z7982 Long term (current) use of aspirin: Secondary | ICD-10-CM | POA: Diagnosis not present

## 2020-04-02 DIAGNOSIS — Z794 Long term (current) use of insulin: Secondary | ICD-10-CM | POA: Diagnosis not present

## 2020-04-02 DIAGNOSIS — Z79899 Other long term (current) drug therapy: Secondary | ICD-10-CM | POA: Insufficient documentation

## 2020-04-02 DIAGNOSIS — Z7952 Long term (current) use of systemic steroids: Secondary | ICD-10-CM | POA: Diagnosis not present

## 2020-04-02 DIAGNOSIS — J449 Chronic obstructive pulmonary disease, unspecified: Secondary | ICD-10-CM | POA: Diagnosis not present

## 2020-04-02 DIAGNOSIS — F17200 Nicotine dependence, unspecified, uncomplicated: Secondary | ICD-10-CM | POA: Insufficient documentation

## 2020-04-02 DIAGNOSIS — E1143 Type 2 diabetes mellitus with diabetic autonomic (poly)neuropathy: Secondary | ICD-10-CM | POA: Diagnosis not present

## 2020-04-02 DIAGNOSIS — K3184 Gastroparesis: Secondary | ICD-10-CM | POA: Diagnosis not present

## 2020-04-02 DIAGNOSIS — R300 Dysuria: Secondary | ICD-10-CM | POA: Insufficient documentation

## 2020-04-02 DIAGNOSIS — Z8673 Personal history of transient ischemic attack (TIA), and cerebral infarction without residual deficits: Secondary | ICD-10-CM | POA: Diagnosis not present

## 2020-04-02 DIAGNOSIS — E11649 Type 2 diabetes mellitus with hypoglycemia without coma: Secondary | ICD-10-CM | POA: Insufficient documentation

## 2020-04-02 DIAGNOSIS — Z7901 Long term (current) use of anticoagulants: Secondary | ICD-10-CM | POA: Insufficient documentation

## 2020-04-02 DIAGNOSIS — I5032 Chronic diastolic (congestive) heart failure: Secondary | ICD-10-CM | POA: Diagnosis not present

## 2020-04-02 DIAGNOSIS — M545 Low back pain, unspecified: Secondary | ICD-10-CM | POA: Insufficient documentation

## 2020-04-02 DIAGNOSIS — I110 Hypertensive heart disease with heart failure: Secondary | ICD-10-CM | POA: Diagnosis not present

## 2020-04-02 LAB — URINALYSIS, ROUTINE W REFLEX MICROSCOPIC
Bilirubin Urine: NEGATIVE
Glucose, UA: NEGATIVE mg/dL
Ketones, ur: NEGATIVE mg/dL
Leukocytes,Ua: NEGATIVE
Nitrite: POSITIVE — AB
Protein, ur: 30 mg/dL — AB
Specific Gravity, Urine: 1.012 (ref 1.005–1.030)
pH: 5 (ref 5.0–8.0)

## 2020-04-02 MED ORDER — LIDOCAINE 5 % EX PTCH
2.0000 | MEDICATED_PATCH | CUTANEOUS | Status: DC
  Administered 2020-04-02: 15:00:00 2 via TRANSDERMAL
  Filled 2020-04-02: qty 2

## 2020-04-02 MED ORDER — DICLOFENAC SODIUM 1 % EX GEL: 2.0000 g | Freq: Four times a day (QID) | CUTANEOUS | 0 refills | Status: DC

## 2020-04-02 MED ORDER — ACETAMINOPHEN 500 MG PO TABS
1000.0000 mg | ORAL_TABLET | Freq: Once | ORAL | Status: AC
  Administered 2020-04-02: 15:00:00 1000 mg via ORAL
  Filled 2020-04-02: qty 2

## 2020-04-02 MED ORDER — CEPHALEXIN 500 MG PO CAPS: 500.0000 mg | ORAL_CAPSULE | Freq: Two times a day (BID) | ORAL | 0 refills | Status: AC
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
## 2020-04-02 NOTE — ED Provider Notes (Signed)
I assumed care of patient at shift change from previous team, please see their note for full H&P.  Physical Exam  BP (!) 179/90 (BP Location: Left Wrist)   Pulse 68   Temp 98.4 F (36.9 C) (Oral)   Resp 20   SpO2 93%   Physical Exam  Patient lying in bed, no obvious distress, has eaten with out difficulty.    ED Course/Procedures     Procedures  Labs Reviewed  URINALYSIS, ROUTINE W REFLEX MICROSCOPIC - Abnormal; Notable for the following components:      Result Value   APPearance HAZY (*)    Hgb urine dipstick SMALL (*)    Protein, ur 30 (*)    Nitrite POSITIVE (*)    Bacteria, UA MANY (*)    All other components within normal limits  URINE CULTURE    MDM   Plan is if urine is infected to start on Keflex.   If negative discharge.   UA is nitrite positive with many bacteria seen.  Urine is sent for culture, will send patient with prescription for Keflex. Labs obtained from April, based on her creatinine adjusted to no more than 1 g a day   Recommended outpatient follow-up.  We discussed that her blood pressure is elevated and that she needs to follow up on this.   Patient discussed with Dr. Rogene Houston.   Return precautions were discussed with patient who states their understanding.  At the time of discharge patient denied any unaddressed complaints or concerns.  Patient is agreeable for discharge home.  Note: Portions of this report may have been transcribed using voice recognition software. Every effort was made to ensure accuracy; however, inadvertent computerized transcription errors may be present.       Lorin Glass, PA-C 04/02/20 1552    Fredia Sorrow, MD 04/03/20 [phone]

## 2020-04-02 NOTE — Discharge Instructions (Addendum)
Your urinalysis was concerning for infection. I have given you a prescription for Keflex.  Please use Tylenol [phone] mg every 6 hours as needed for pain.  Warm compresses and lidocaine patches to the area can improve your symptoms.  I have also put a prescription in for Voltaren gel to your pharmacy.  Additionally your blood pressure is elevated while in the emergency room today.  Please make sure you are taking your medications and follow-up with your primary care doctor as untreated high blood pressure can cause serious long-term damage.

## 2020-04-02 NOTE — ED Triage Notes (Signed)
Pt here from home with c/o pain all over times 3 days , no obvious injures

## 2020-04-05 LAB — URINE CULTURE: Culture: >=100000 — AB

## 2020-04-06 ENCOUNTER — Telehealth: Payer: Self-pay | Admitting: Emergency Medicine

## 2020-04-06 NOTE — Telephone Encounter (Signed)
Post ED Visit - Positive Culture Follow-up: Unsuccessful Patient Follow-up  Culture assessed and recommendations reviewed by:  []  Elenor Quinones, Pharm.D. []  Heide Guile, Pharm.D., BCPS AQ-ID []  Parks Neptune, Pharm.D., BCPS []  Alycia Rossetti, Pharm.D., BCPS []  [place], [place].D., BCPS, AAHIVP []  [name], Pharm.D., BCPS, AAHIVP []  Duanne Limerick, PharmD [x]  Vincenza Hews, PharmD, BCPS  Positive urine culture  []  Patient discharged without antimicrobial prescription and treatment is now indicated [x]  Organism is resistant to prescribed ED discharge antimicrobial []  Patient with positive blood cultures   Unable to contact patient @ phone number on file, letter will be sent to address on file  Plan: Cefdinir 300 mg PO BID x five days - Kylie Lanius PA   [name] 04/06/2020, 6:32 PM

## 2020-04-21 ENCOUNTER — Ambulatory Visit: Payer: Medicare Other | Admitting: Podiatry

## 2020-04-22 ENCOUNTER — Other Ambulatory Visit: Payer: Self-pay

## 2020-04-22 ENCOUNTER — Encounter (INDEPENDENT_AMBULATORY_CARE_PROVIDER_SITE_OTHER): Payer: Medicare HMO | Admitting: Ophthalmology

## 2020-04-22 DIAGNOSIS — H43813 Vitreous degeneration, bilateral: Secondary | ICD-10-CM | POA: Diagnosis not present

## 2020-04-22 DIAGNOSIS — I10 Essential (primary) hypertension: Secondary | ICD-10-CM

## 2020-04-22 DIAGNOSIS — E113412 Type 2 diabetes mellitus with severe nonproliferative diabetic retinopathy with macular edema, left eye: Secondary | ICD-10-CM

## 2020-04-22 DIAGNOSIS — H35033 Hypertensive retinopathy, bilateral: Secondary | ICD-10-CM

## 2020-04-22 DIAGNOSIS — E113311 Type 2 diabetes mellitus with moderate nonproliferative diabetic retinopathy with macular edema, right eye: Secondary | ICD-10-CM | POA: Diagnosis not present

## 2020-04-25 ENCOUNTER — Other Ambulatory Visit: Payer: Self-pay | Admitting: Endocrinology

## 2020-04-30 ENCOUNTER — Ambulatory Visit: Payer: Medicare HMO | Admitting: Podiatry

## 2020-04-30 ENCOUNTER — Other Ambulatory Visit: Payer: Self-pay

## 2020-04-30 ENCOUNTER — Ambulatory Visit (INDEPENDENT_AMBULATORY_CARE_PROVIDER_SITE_OTHER): Payer: Medicare HMO

## 2020-04-30 DIAGNOSIS — E0843 Diabetes mellitus due to underlying condition with diabetic autonomic (poly)neuropathy: Secondary | ICD-10-CM

## 2020-04-30 DIAGNOSIS — L97512 Non-pressure chronic ulcer of other part of right foot with fat layer exposed: Secondary | ICD-10-CM | POA: Diagnosis not present

## 2020-04-30 DIAGNOSIS — K7460 Unspecified cirrhosis of liver: Secondary | ICD-10-CM | POA: Diagnosis not present

## 2020-04-30 DIAGNOSIS — J449 Chronic obstructive pulmonary disease, unspecified: Secondary | ICD-10-CM | POA: Diagnosis not present

## 2020-04-30 DIAGNOSIS — E1142 Type 2 diabetes mellitus with diabetic polyneuropathy: Secondary | ICD-10-CM | POA: Diagnosis not present

## 2020-04-30 DIAGNOSIS — I509 Heart failure, unspecified: Secondary | ICD-10-CM | POA: Diagnosis not present

## 2020-04-30 MED ORDER — GENTAMICIN SULFATE 0.1 % EX CREA: 1.0000 "application " | TOPICAL_CREAM | Freq: Two times a day (BID) | CUTANEOUS | 1 refills | Status: DC

## 2020-04-30 NOTE — Progress Notes (Signed)
Subjective:  80 y.o. female with PMHx of diabetes mellitus presenting for new complaint regarding a sore/ulcer that is developed to the patient's right hallux.  Patient states that it has been present for approximately 3-4 weeks now.  She has been applying hydrogen peroxide with minimal improvement.  She presents for further treatment and evaluation  Patient also states that she injured her left foot and it has been painful.  She is unable to bear significant weight although she is minimally weightbearing.  She is concerned that possibly she broke her foot   Past Medical History:  Diagnosis Date  . ALLERGIC RHINITIS 10/29/2006  . Arthritis    "hands, feet, legs, arms" (11/22/2012)  . ASYMPTOMATIC POSTMENOPAUSAL STATUS 02/22/2008  . 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 CHEST PAIN 02/22/2008   LHC in 7/05 and 1/11: normal; Myoview in 11/10 that demonstrated an EF of 51% and slight reversible anterior and septal defect of borderline significance (false + test);  Echo 04/2009:  Mild LVH, EF 55-60%, Gr 1 DD, MAC.    [name] CHF (congestive heart failure) ([place])   . Cholelithiasis   . Chronic back pain   . CIRRHOSIS 10/29/2006  . COLONIC POLYPS 09/13/2007  . COPD 04/22/2009  . DEGENERATIVE JOINT DISEASE 06/15/2007  . DEPRESSION 02/22/2008  . Depression   . Diabetes mellitus ([place])   . Diverticulosis   . Fatty liver   . Gastritis   . Gastroparesis   . GERD 10/29/2006  . Headache(784.0)    "not everyday now" (11/22/2012)  . Heart failure with preserved ejection fraction ([place])   . 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 HYPERCHOLESTEROLEMIA 02/25/2009  . Hyperlipidemia   . HYPERTENSION 12/20/2008  . Hypertension   . Migraine   . Myocardial infarction ([place])   . Neuropathy   . Obesity   . OSA (obstructive sleep apnea)    "quit wearing my CPAP" (11/22/2012)  . OSTEOPOROSIS 10/29/2006  . SEIZURE DISORDER    "silent seizures" (11/22/2012)  . Shortness of breath    "off and on" (11/22/2012)  . Stroke  [facility]) 04/2004   Jerrol Banana 04/22/2004; "still weaker on the right" (11/22/2012)  . Stroke ([place])   . Type II diabetes mellitus ([place])       Objective/Physical Exam General: The patient is alert and oriented x3 in no acute distress.  Dermatology:  Wound #1 noted to the dorsal medial aspect of the right great toe measuring approximately 2.0 x 1.0 x 0.2 cm (LxWxD).   To the noted ulceration(s), there is no eschar. There is a moderate amount of slough, fibrin, and necrotic tissue noted. Granulation tissue and wound base is red. There is a minimal amount of serosanguineous drainage noted. There is no exposed bone muscle-tendon ligament or joint. There is no malodor. Periwound integrity is intact. Skin is warm, dry and supple bilateral lower extremities.  Vascular: Palpable pedal pulses bilaterally. No edema or erythema noted. Capillary refill within normal limits.  Neurological: Epicritic and protective threshold diminished bilaterally.   Musculoskeletal Exam: Range of motion within normal limits to all pedal and ankle joints bilateral. Muscle strength 5/5 in all groups bilateral.  There is some tenderness to palpation on the left foot  Radiographic exam: Normal osseous mineralization.  Diffuse degenerative changes noted consistent with patient's age and osteoarthritis.  No fracture identified.  Assessment: 1.  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 diabetes mellitus w/ peripheral neuropathy 3.  Contusion left foot   Plan of Care:  1. Patient was evaluated. 2. medically necessary excisional debridement including subcutaneous tissue was performed using a tissue nipper and a chisel blade. Excisional debridement of all the necrotic nonviable tissue down to healthy bleeding viable tissue was performed with post-debridement measurements same as pre-. 3. the wound was cleansed and dry sterile dressing applied. 4.  Prescription for gentamicin cream.  Apply daily.   5.  Patient is to  return to clinic in 2 weeks.   Edrick Kins, DPM [facility]  Dr. Edrick Kins, DPM    [address], [place] [address]                Office [phone]  Fax [phone]

## 2020-05-02 DIAGNOSIS — J449 Chronic obstructive pulmonary disease, unspecified: Secondary | ICD-10-CM | POA: Diagnosis not present

## 2020-05-02 DIAGNOSIS — I509 Heart failure, unspecified: Secondary | ICD-10-CM | POA: Diagnosis not present

## 2020-05-20 ENCOUNTER — Encounter (INDEPENDENT_AMBULATORY_CARE_PROVIDER_SITE_OTHER): Payer: Medicare HMO | Admitting: Ophthalmology

## 2020-05-27 ENCOUNTER — Other Ambulatory Visit: Payer: Self-pay

## 2020-05-27 ENCOUNTER — Encounter (INDEPENDENT_AMBULATORY_CARE_PROVIDER_SITE_OTHER): Payer: Medicare HMO | Admitting: Ophthalmology

## 2020-05-27 DIAGNOSIS — I10 Essential (primary) hypertension: Secondary | ICD-10-CM

## 2020-05-27 DIAGNOSIS — H43813 Vitreous degeneration, bilateral: Secondary | ICD-10-CM

## 2020-05-27 DIAGNOSIS — H35033 Hypertensive retinopathy, bilateral: Secondary | ICD-10-CM | POA: Diagnosis not present

## 2020-05-27 DIAGNOSIS — E103412 Type 1 diabetes mellitus with severe nonproliferative diabetic retinopathy with macular edema, left eye: Secondary | ICD-10-CM | POA: Diagnosis not present

## 2020-05-27 DIAGNOSIS — E103311 Type 1 diabetes mellitus with moderate nonproliferative diabetic retinopathy with macular edema, right eye: Secondary | ICD-10-CM

## 2020-05-28 ENCOUNTER — Ambulatory Visit: Payer: Medicare HMO | Admitting: Podiatry

## 2020-06-02 DIAGNOSIS — I509 Heart failure, unspecified: Secondary | ICD-10-CM | POA: Diagnosis not present

## 2020-06-02 DIAGNOSIS — J449 Chronic obstructive pulmonary disease, unspecified: Secondary | ICD-10-CM | POA: Diagnosis not present

## 2020-06-24 ENCOUNTER — Encounter (INDEPENDENT_AMBULATORY_CARE_PROVIDER_SITE_OTHER): Payer: Medicare HMO | Admitting: Ophthalmology

## 2020-06-24 ENCOUNTER — Other Ambulatory Visit: Payer: Self-pay

## 2020-06-24 DIAGNOSIS — H35033 Hypertensive retinopathy, bilateral: Secondary | ICD-10-CM

## 2020-06-24 DIAGNOSIS — I10 Essential (primary) hypertension: Secondary | ICD-10-CM

## 2020-06-24 DIAGNOSIS — E113313 Type 2 diabetes mellitus with moderate nonproliferative diabetic retinopathy with macular edema, bilateral: Secondary | ICD-10-CM

## 2020-06-24 DIAGNOSIS — H43813 Vitreous degeneration, bilateral: Secondary | ICD-10-CM | POA: Diagnosis not present

## 2020-06-30 DIAGNOSIS — J449 Chronic obstructive pulmonary disease, unspecified: Secondary | ICD-10-CM | POA: Diagnosis not present

## 2020-06-30 DIAGNOSIS — I509 Heart failure, unspecified: Secondary | ICD-10-CM | POA: Diagnosis not present

## 2020-07-22 ENCOUNTER — Encounter (INDEPENDENT_AMBULATORY_CARE_PROVIDER_SITE_OTHER): Payer: Medicare HMO | Admitting: Ophthalmology

## 2020-07-22 ENCOUNTER — Other Ambulatory Visit: Payer: Self-pay

## 2020-07-22 DIAGNOSIS — E113313 Type 2 diabetes mellitus with moderate nonproliferative diabetic retinopathy with macular edema, bilateral: Secondary | ICD-10-CM | POA: Diagnosis not present

## 2020-07-22 DIAGNOSIS — H35033 Hypertensive retinopathy, bilateral: Secondary | ICD-10-CM | POA: Diagnosis not present

## 2020-07-22 DIAGNOSIS — I10 Essential (primary) hypertension: Secondary | ICD-10-CM

## 2020-07-22 DIAGNOSIS — H43813 Vitreous degeneration, bilateral: Secondary | ICD-10-CM | POA: Diagnosis not present

## 2020-07-30 ENCOUNTER — Other Ambulatory Visit: Payer: Self-pay

## 2020-07-31 DIAGNOSIS — I509 Heart failure, unspecified: Secondary | ICD-10-CM | POA: Diagnosis not present

## 2020-07-31 DIAGNOSIS — J449 Chronic obstructive pulmonary disease, unspecified: Secondary | ICD-10-CM | POA: Diagnosis not present

## 2020-08-20 ENCOUNTER — Other Ambulatory Visit: Payer: Self-pay

## 2020-08-20 ENCOUNTER — Encounter (INDEPENDENT_AMBULATORY_CARE_PROVIDER_SITE_OTHER): Payer: Medicare HMO | Admitting: Ophthalmology

## 2020-08-20 DIAGNOSIS — H35033 Hypertensive retinopathy, bilateral: Secondary | ICD-10-CM

## 2020-08-20 DIAGNOSIS — H43813 Vitreous degeneration, bilateral: Secondary | ICD-10-CM | POA: Diagnosis not present

## 2020-08-20 DIAGNOSIS — E113313 Type 2 diabetes mellitus with moderate nonproliferative diabetic retinopathy with macular edema, bilateral: Secondary | ICD-10-CM

## 2020-08-20 DIAGNOSIS — I10 Essential (primary) hypertension: Secondary | ICD-10-CM

## 2020-08-30 DIAGNOSIS — J449 Chronic obstructive pulmonary disease, unspecified: Secondary | ICD-10-CM | POA: Diagnosis not present

## 2020-08-30 DIAGNOSIS — I509 Heart failure, unspecified: Secondary | ICD-10-CM | POA: Diagnosis not present

## 2020-09-17 ENCOUNTER — Encounter (INDEPENDENT_AMBULATORY_CARE_PROVIDER_SITE_OTHER): Payer: Medicare HMO | Admitting: Ophthalmology

## 2020-09-22 ENCOUNTER — Encounter (INDEPENDENT_AMBULATORY_CARE_PROVIDER_SITE_OTHER): Payer: Medicare HMO | Admitting: Ophthalmology

## 2020-09-22 ENCOUNTER — Other Ambulatory Visit: Payer: Self-pay

## 2020-09-22 DIAGNOSIS — H35033 Hypertensive retinopathy, bilateral: Secondary | ICD-10-CM | POA: Diagnosis not present

## 2020-09-22 DIAGNOSIS — H43813 Vitreous degeneration, bilateral: Secondary | ICD-10-CM

## 2020-09-22 DIAGNOSIS — I10 Essential (primary) hypertension: Secondary | ICD-10-CM | POA: Diagnosis not present

## 2020-09-22 DIAGNOSIS — E113313 Type 2 diabetes mellitus with moderate nonproliferative diabetic retinopathy with macular edema, bilateral: Secondary | ICD-10-CM | POA: Diagnosis not present

## 2020-09-30 DIAGNOSIS — I509 Heart failure, unspecified: Secondary | ICD-10-CM | POA: Diagnosis not present

## 2020-09-30 DIAGNOSIS — J449 Chronic obstructive pulmonary disease, unspecified: Secondary | ICD-10-CM | POA: Diagnosis not present

## 2020-10-29 ENCOUNTER — Encounter (INDEPENDENT_AMBULATORY_CARE_PROVIDER_SITE_OTHER): Payer: Medicare HMO | Admitting: Ophthalmology

## 2020-10-30 DIAGNOSIS — I509 Heart failure, unspecified: Secondary | ICD-10-CM | POA: Diagnosis not present

## 2020-10-30 DIAGNOSIS — J449 Chronic obstructive pulmonary disease, unspecified: Secondary | ICD-10-CM | POA: Diagnosis not present

## 2020-11-03 ENCOUNTER — Encounter (INDEPENDENT_AMBULATORY_CARE_PROVIDER_SITE_OTHER): Payer: Medicare HMO | Admitting: Ophthalmology

## 2020-11-03 ENCOUNTER — Other Ambulatory Visit: Payer: Self-pay

## 2020-11-03 DIAGNOSIS — H43813 Vitreous degeneration, bilateral: Secondary | ICD-10-CM | POA: Diagnosis not present

## 2020-11-03 DIAGNOSIS — H35033 Hypertensive retinopathy, bilateral: Secondary | ICD-10-CM | POA: Diagnosis not present

## 2020-11-03 DIAGNOSIS — E113313 Type 2 diabetes mellitus with moderate nonproliferative diabetic retinopathy with macular edema, bilateral: Secondary | ICD-10-CM

## 2020-11-03 DIAGNOSIS — I10 Essential (primary) hypertension: Secondary | ICD-10-CM | POA: Diagnosis not present

## 2020-11-30 DIAGNOSIS — J449 Chronic obstructive pulmonary disease, unspecified: Secondary | ICD-10-CM | POA: Diagnosis not present

## 2020-11-30 DIAGNOSIS — I509 Heart failure, unspecified: Secondary | ICD-10-CM | POA: Diagnosis not present

## 2020-12-03 ENCOUNTER — Encounter (INDEPENDENT_AMBULATORY_CARE_PROVIDER_SITE_OTHER): Payer: Medicare HMO | Admitting: Ophthalmology

## 2020-12-05 ENCOUNTER — Encounter (INDEPENDENT_AMBULATORY_CARE_PROVIDER_SITE_OTHER): Payer: Self-pay

## 2020-12-05 ENCOUNTER — Encounter (INDEPENDENT_AMBULATORY_CARE_PROVIDER_SITE_OTHER): Payer: Medicare HMO | Admitting: Ophthalmology

## 2020-12-11 DIAGNOSIS — I639 Cerebral infarction, unspecified: Secondary | ICD-10-CM | POA: Diagnosis not present

## 2020-12-11 DIAGNOSIS — J449 Chronic obstructive pulmonary disease, unspecified: Secondary | ICD-10-CM | POA: Diagnosis not present

## 2020-12-11 DIAGNOSIS — R600 Localized edema: Secondary | ICD-10-CM | POA: Diagnosis not present

## 2020-12-11 DIAGNOSIS — R2689 Other abnormalities of gait and mobility: Secondary | ICD-10-CM | POA: Diagnosis not present

## 2020-12-11 DIAGNOSIS — M25562 Pain in left knee: Secondary | ICD-10-CM | POA: Diagnosis not present

## 2020-12-23 ENCOUNTER — Encounter (INDEPENDENT_AMBULATORY_CARE_PROVIDER_SITE_OTHER): Payer: Medicare HMO | Admitting: Ophthalmology

## 2020-12-23 ENCOUNTER — Encounter (INDEPENDENT_AMBULATORY_CARE_PROVIDER_SITE_OTHER): Payer: Self-pay

## 2020-12-31 DIAGNOSIS — J449 Chronic obstructive pulmonary disease, unspecified: Secondary | ICD-10-CM | POA: Diagnosis not present

## 2020-12-31 DIAGNOSIS — I509 Heart failure, unspecified: Secondary | ICD-10-CM | POA: Diagnosis not present

## 2021-01-27 ENCOUNTER — Other Ambulatory Visit: Payer: Self-pay

## 2021-01-27 ENCOUNTER — Encounter: Payer: Self-pay | Admitting: Podiatry

## 2021-01-27 ENCOUNTER — Ambulatory Visit (INDEPENDENT_AMBULATORY_CARE_PROVIDER_SITE_OTHER): Payer: Medicare HMO | Admitting: Podiatry

## 2021-01-27 ENCOUNTER — Ambulatory Visit: Payer: Medicare HMO | Admitting: Podiatry

## 2021-01-27 DIAGNOSIS — E11622 Type 2 diabetes mellitus with other skin ulcer: Secondary | ICD-10-CM | POA: Diagnosis not present

## 2021-01-27 DIAGNOSIS — L97321 Non-pressure chronic ulcer of left ankle limited to breakdown of skin: Secondary | ICD-10-CM

## 2021-01-27 DIAGNOSIS — L97301 Non-pressure chronic ulcer of unspecified ankle limited to breakdown of skin: Secondary | ICD-10-CM

## 2021-01-27 MED ORDER — CEPHALEXIN 500 MG PO CAPS: 500.0000 mg | ORAL_CAPSULE | Freq: Four times a day (QID) | ORAL | 0 refills | Status: AC

## 2021-01-27 MED ORDER — SILVER SULFADIAZINE 1 % EX CREA: 1.0000 "application " | TOPICAL_CREAM | Freq: Every day | CUTANEOUS | 0 refills | Status: DC

## 2021-01-27 NOTE — Progress Notes (Signed)
Subjective:  Patient ID: [name], female    DOB: 08-04-1940,   MRN: [id number]  Chief Complaint  Patient presents with   Blister    Bilateral fluid filled blisters x 2 days that are very painful.     80 y.o. female presents for concern of left foot blister that has been present for about two days. Relates pain in this area. But also relates numbness and tinlging in the foot. Patient is diabetic and last A1c was 8.6 04/20/19. [name] Denies any other pedal complaints. Denies n/v/f/c.   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     CHF (congestive heart failure) ([place])    Cholelithiasis    Chronic back pain    CIRRHOSIS 10/29/2006   COLONIC POLYPS 09/13/2007   COPD 04/22/2009   DEGENERATIVE JOINT DISEASE 06/15/2007   DEPRESSION 02/22/2008   Depression    Diabetes mellitus ([place])    Diverticulosis    Fatty liver    Gastritis    Gastroparesis    GERD 10/29/2006   Headache(784.0)    "not everyday now" (11/22/2012)   Heart failure with preserved ejection fraction ([place])    Hepatic cirrhosis ([place])    HYPERCHOLESTEROLEMIA 02/25/2009   Hyperlipidemia    HYPERTENSION 12/20/2008   Hypertension    Migraine    Myocardial infarction ([place])    Neuropathy    Obesity    OSA (obstructive sleep apnea)    "quit wearing my CPAP" (11/22/2012)   OSTEOPOROSIS 10/29/2006   SEIZURE DISORDER    "silent seizures" (11/22/2012)   Shortness of breath    "off and on" (11/22/2012)   Stroke ([place]) 04/2004   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 CFT <3 seconds. Normal hair growth on digits. No edema.  Skin. No lacerations or abrasions bilateral feet. 6 cm x 6 cm blister to medial left ankle upon debridement superficial granular wound noted.  Musculoskeletal: MMT 5/5 bilateral lower extremities in DF, PF, Inversion and Eversion. Deceased ROM in DF of ankle joint. Tender to area around blister.  Neurological: Sensation intact to light touch. Protective sensation diminished.   Assessment:   1. Diabetic ulcer of ankle, limited to breakdown of skin [facility])      Plan:  Patient was evaluated and treated and all questions answered. Ulcer left medial ankle/foot with overlying blister. -Debridement as below. -Dressed with silvadene, DSD. -Patient to change dressing daily.  -Prescription for silvadene and keflex sent to pharmacy. Return in one week  Procedure: Excisional Debridement of Wound Rationale: Removal of non-viable soft tissue from the wound to promote healing.  Anesthesia: none Pre-Debridement Wound Measurements: overlying blister 6 cm x 6 cm   Post-Debridement Wound Measurements: 6 cm x 6 cm x 0.1 cm  Type of Debridement: Sharp Excisional Tissue Removed: Non-viable soft tissue Depth of Debridement: subcutaneous tissue. Technique: Sharp excisional debridement to bleeding, viable wound base.  Dressing: Dry, sterile,  compression dressing. Disposition: Patient tolerated procedure well. Patient to return in 1 week for follow-up.  Return in about 1 week (around 02/03/2021) for w, wound check.   Lorenda Peck, DPM

## 2021-01-30 DIAGNOSIS — I509 Heart failure, unspecified: Secondary | ICD-10-CM | POA: Diagnosis not present

## 2021-01-30 DIAGNOSIS — J449 Chronic obstructive pulmonary disease, unspecified: Secondary | ICD-10-CM | POA: Diagnosis not present

## 2021-02-03 ENCOUNTER — Encounter: Payer: Self-pay | Admitting: Podiatry

## 2021-02-03 ENCOUNTER — Ambulatory Visit: Payer: Medicare HMO | Admitting: Podiatry

## 2021-02-03 ENCOUNTER — Other Ambulatory Visit: Payer: Self-pay

## 2021-02-03 DIAGNOSIS — E11622 Type 2 diabetes mellitus with other skin ulcer: Secondary | ICD-10-CM | POA: Diagnosis not present

## 2021-02-03 DIAGNOSIS — L97301 Non-pressure chronic ulcer of unspecified ankle limited to breakdown of skin: Secondary | ICD-10-CM

## 2021-02-03 DIAGNOSIS — E0843 Diabetes mellitus due to underlying condition with diabetic autonomic (poly)neuropathy: Secondary | ICD-10-CM

## 2021-02-03 NOTE — Progress Notes (Signed)
Subjective:  Patient ID: [name], female    DOB: 06-Jun-1940,   MRN: [id number]  Chief Complaint  Patient presents with   Blister    Follow up left heel blister. Pt states improvement and she can walk more.    Nail Problem    Nail fungus. Pt wants to know what she can take for her nail fungus.     80 y.o. female presents for follow-up of left heel blister. Relates it is doing better. Still has some tenderness. Has been taking abx and been dressing as instructed. . Patient is diabetic and last A1c was 8.6 04/20/19. [name] Denies any other pedal complaints. Denies n/v/f/c.   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     CHF (congestive heart failure) ([place])    Cholelithiasis    Chronic back pain    CIRRHOSIS 10/29/2006   COLONIC POLYPS 09/13/2007   COPD 04/22/2009   DEGENERATIVE JOINT DISEASE 06/15/2007   DEPRESSION 02/22/2008   Depression    Diabetes mellitus ([place])    Diverticulosis    Fatty liver    Gastritis    Gastroparesis    GERD 10/29/2006   Headache(784.0)    "not everyday now" (11/22/2012)   Heart failure with preserved ejection fraction ([place])    Hepatic cirrhosis ([place])    HYPERCHOLESTEROLEMIA 02/25/2009   Hyperlipidemia    HYPERTENSION 12/20/2008   Hypertension    Migraine    Myocardial infarction ([place])    Neuropathy    Obesity    OSA (obstructive sleep apnea)    "quit wearing my CPAP" (11/22/2012)   OSTEOPOROSIS 10/29/2006   SEIZURE DISORDER    "silent seizures" (11/22/2012)   Shortness of breath    "off and on" (11/22/2012)   Stroke ([place])  04/2004   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 CFT <3 seconds. Normal hair growth on digits. No edema.  Skin. No lacerations or abrasions bilateral feet. Over all blister healed in small area distally with open wound measuring 1.5 cm x 1 cm x 0.3 cm with fibrogranular base.  Musculoskeletal: MMT 5/5 bilateral lower extremities in DF, PF, Inversion and Eversion. Deceased ROM in DF of ankle joint. Tender to area around blister.  Neurological: Sensation intact to light touch. Protective sensation diminished.   Assessment:   1. Diabetic ulcer of ankle, limited to breakdown of skin ([place])   2. Diabetes mellitus due to underlying condition with diabetic autonomic neuropathy, unspecified whether long term insulin use ([place])       Plan:  Patient was evaluated and treated and all questions answered. Ulcer left medial ankle/foot with overlying blister. -Debridement as below. -Dressed with silvadene, DSD. -Patient to change dressing daily.  -Prescription for silvadene and keflex sent to pharmacy. Return in one week  Procedure: Excisional Debridement of Wound Rationale: Removal of non-viable soft tissue from the wound to promote healing.  Anesthesia: none Pre-Debridement Wound Measurements:  1cm x 1.5 cm x 0.3 cm  Post-Debridement Wound Measurements: 1 cm x 1.5 cm x 0.3 cm  Type of Debridement: Sharp Excisional Tissue Removed: Non-viable soft tissue Depth of Debridement: subcutaneous tissue. Technique: Sharp excisional debridement to bleeding, viable wound base.  Dressing: Dry, sterile, compression dressing. Disposition: Patient tolerated procedure well. Patient to return in 2 week for follow-up.  Return in about 2 weeks (around 02/17/2021) for wound check.   Lorenda Peck, DPM

## 2021-02-06 DIAGNOSIS — I5032 Chronic diastolic (congestive) heart failure: Secondary | ICD-10-CM | POA: Diagnosis not present

## 2021-02-06 DIAGNOSIS — Z6841 Body Mass Index (BMI) 40.0 and over, adult: Secondary | ICD-10-CM | POA: Diagnosis not present

## 2021-02-06 DIAGNOSIS — Z0001 Encounter for general adult medical examination with abnormal findings: Secondary | ICD-10-CM | POA: Diagnosis not present

## 2021-02-06 DIAGNOSIS — I700 Atherosclerosis of aorta: Secondary | ICD-10-CM | POA: Diagnosis not present

## 2021-02-06 DIAGNOSIS — F419 Anxiety disorder, unspecified: Secondary | ICD-10-CM | POA: Diagnosis not present

## 2021-02-06 DIAGNOSIS — N2581 Secondary hyperparathyroidism of renal origin: Secondary | ICD-10-CM | POA: Diagnosis not present

## 2021-02-06 DIAGNOSIS — E1151 Type 2 diabetes mellitus with diabetic peripheral angiopathy without gangrene: Secondary | ICD-10-CM | POA: Diagnosis not present

## 2021-02-06 DIAGNOSIS — E1139 Type 2 diabetes mellitus with other diabetic ophthalmic complication: Secondary | ICD-10-CM | POA: Diagnosis not present

## 2021-02-06 DIAGNOSIS — E1122 Type 2 diabetes mellitus with diabetic chronic kidney disease: Secondary | ICD-10-CM | POA: Diagnosis not present

## 2021-02-06 DIAGNOSIS — Z23 Encounter for immunization: Secondary | ICD-10-CM | POA: Diagnosis not present

## 2021-02-06 DIAGNOSIS — I25118 Atherosclerotic heart disease of native coronary artery with other forms of angina pectoris: Secondary | ICD-10-CM | POA: Diagnosis not present

## 2021-02-06 DIAGNOSIS — J449 Chronic obstructive pulmonary disease, unspecified: Secondary | ICD-10-CM | POA: Diagnosis not present

## 2021-02-06 DIAGNOSIS — E1142 Type 2 diabetes mellitus with diabetic polyneuropathy: Secondary | ICD-10-CM | POA: Diagnosis not present

## 2021-02-06 DIAGNOSIS — G40909 Epilepsy, unspecified, not intractable, without status epilepticus: Secondary | ICD-10-CM | POA: Diagnosis not present

## 2021-02-06 DIAGNOSIS — I130 Hypertensive heart and chronic kidney disease with heart failure and stage 1 through stage 4 chronic kidney disease, or unspecified chronic kidney disease: Secondary | ICD-10-CM | POA: Diagnosis not present

## 2021-02-06 DIAGNOSIS — N1831 Chronic kidney disease, stage 3a: Secondary | ICD-10-CM | POA: Diagnosis not present

## 2021-02-06 DIAGNOSIS — Z79899 Other long term (current) drug therapy: Secondary | ICD-10-CM | POA: Diagnosis not present

## 2021-02-06 DIAGNOSIS — Z794 Long term (current) use of insulin: Secondary | ICD-10-CM | POA: Diagnosis not present

## 2021-02-06 DIAGNOSIS — F330 Major depressive disorder, recurrent, mild: Secondary | ICD-10-CM | POA: Diagnosis not present

## 2021-02-17 ENCOUNTER — Ambulatory Visit: Payer: Medicare HMO | Admitting: Podiatry

## 2021-02-24 ENCOUNTER — Ambulatory Visit: Payer: Medicare HMO | Admitting: Podiatry

## 2021-02-24 ENCOUNTER — Other Ambulatory Visit: Payer: Self-pay

## 2021-02-24 ENCOUNTER — Encounter: Payer: Self-pay | Admitting: Podiatry

## 2021-02-24 DIAGNOSIS — L97301 Non-pressure chronic ulcer of unspecified ankle limited to breakdown of skin: Secondary | ICD-10-CM | POA: Diagnosis not present

## 2021-02-24 DIAGNOSIS — L97321 Non-pressure chronic ulcer of left ankle limited to breakdown of skin: Secondary | ICD-10-CM | POA: Diagnosis not present

## 2021-02-24 DIAGNOSIS — E11622 Type 2 diabetes mellitus with other skin ulcer: Secondary | ICD-10-CM

## 2021-02-24 DIAGNOSIS — E0843 Diabetes mellitus due to underlying condition with diabetic autonomic (poly)neuropathy: Secondary | ICD-10-CM

## 2021-02-24 NOTE — Progress Notes (Signed)
Subjective:  Patient ID: [name], female    DOB: 05-12-1940,   MRN: [id number]  No chief complaint on file.   80 y.o. female presents for follow-up of left heel blister. Relates it is doing better. Still has some tenderness. Finished abx and dressing as instructed.. Patient is diabetic and last A1c was 8.6 04/20/19. [name] Denies any other pedal complaints. Denies n/v/f/c.   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     CHF (congestive heart failure) ([place])    Cholelithiasis    Chronic back pain    CIRRHOSIS 10/29/2006   COLONIC POLYPS 09/13/2007   COPD 04/22/2009   DEGENERATIVE JOINT DISEASE 06/15/2007   DEPRESSION 02/22/2008   Depression    Diabetes mellitus ([place])    Diverticulosis    Fatty liver    Gastritis    Gastroparesis    GERD 10/29/2006   Headache(784.0)    "not everyday now" (11/22/2012)   Heart failure with preserved ejection fraction ([place])    Hepatic cirrhosis ([place])    HYPERCHOLESTEROLEMIA 02/25/2009   Hyperlipidemia    HYPERTENSION 12/20/2008   Hypertension    Migraine    Myocardial infarction ([place])    Neuropathy    Obesity    OSA (obstructive sleep apnea)    "quit wearing my CPAP" (11/22/2012)   OSTEOPOROSIS 10/29/2006   SEIZURE DISORDER    "silent seizures" (11/22/2012)   Shortness of breath    "off and on" (11/22/2012)   Stroke ([place]) 04/2004   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 CFT <3 seconds.  Normal hair growth on digits. No edema.  Skin. No lacerations or abrasions bilateral feet. Over all blister healed in small area distally with open wound measuring 1.3 cm x 0.3 cm x 0.1 cm with fibrogranular base.  Musculoskeletal: MMT 5/5 bilateral lower extremities in DF, PF, Inversion and Eversion. Deceased ROM in DF of ankle joint. Tender to area around blister.  Neurological: Sensation intact to light touch. Protective sensation diminished.   Assessment:   1. Diabetic ulcer of ankle, limited to breakdown of skin ([place])   2. Diabetes mellitus due to underlying condition with diabetic autonomic neuropathy, unspecified whether long term insulin use ([place])        Plan:  Patient was evaluated and treated and all questions answered. Ulcer left medial ankle/foot with overlying blister. -Debridement as below. -Dressed with silvadene, DSD. -Surgical shoe dispensed.  -Patient to change dressing daily.  Return in three week  Procedure: Excisional Debridement of Wound Rationale: Removal of non-viable soft tissue from the wound to promote healing.  Anesthesia: none Pre-Debridement Wound Measurements: 1.cm x 0.0.2 cm x 0.1 cm  Post-Debridement Wound Measurements: 1 .3cm x 0.3cm x 0.1 cm  Type of Debridement: Sharp Excisional Tissue Removed: Non-viable soft tissue Depth of Debridement: subcutaneous tissue. Technique: Sharp excisional debridement to bleeding, viable wound base.  Dressing: Dry, sterile, compression dressing. Disposition: Patient  tolerated procedure well. Patient to return in 2 week for follow-up.  Return in about 2 weeks (around 03/10/2021) for wound check.   Lorenda Peck, DPM

## 2021-03-17 ENCOUNTER — Ambulatory Visit: Payer: Medicare HMO | Admitting: Podiatry

## 2021-04-14 ENCOUNTER — Other Ambulatory Visit: Payer: Self-pay | Admitting: Family

## 2021-04-14 DIAGNOSIS — M25562 Pain in left knee: Secondary | ICD-10-CM

## 2021-04-27 ENCOUNTER — Other Ambulatory Visit: Payer: Self-pay

## 2021-04-27 ENCOUNTER — Encounter (INDEPENDENT_AMBULATORY_CARE_PROVIDER_SITE_OTHER): Payer: Medicare PPO | Admitting: Ophthalmology

## 2021-04-27 DIAGNOSIS — I10 Essential (primary) hypertension: Secondary | ICD-10-CM

## 2021-04-27 DIAGNOSIS — H43813 Vitreous degeneration, bilateral: Secondary | ICD-10-CM | POA: Diagnosis not present

## 2021-04-27 DIAGNOSIS — H35033 Hypertensive retinopathy, bilateral: Secondary | ICD-10-CM | POA: Diagnosis not present

## 2021-04-27 DIAGNOSIS — E113413 Type 2 diabetes mellitus with severe nonproliferative diabetic retinopathy with macular edema, bilateral: Secondary | ICD-10-CM

## 2021-05-15 ENCOUNTER — Other Ambulatory Visit: Payer: Self-pay

## 2021-05-15 ENCOUNTER — Ambulatory Visit
Admission: RE | Admit: 2021-05-15 | Discharge: 2021-05-15 | Disposition: A | Discharge status: Home / Self Care | Payer: Medicare PPO | Source: Ambulatory Visit | Attending: Family | Admitting: Family

## 2021-05-15 DIAGNOSIS — M25562 Pain in left knee: Secondary | ICD-10-CM

## 2021-05-15 DIAGNOSIS — M25462 Effusion, left knee: Secondary | ICD-10-CM | POA: Diagnosis not present

## 2021-05-27 ENCOUNTER — Encounter (INDEPENDENT_AMBULATORY_CARE_PROVIDER_SITE_OTHER): Payer: Medicare PPO | Admitting: Ophthalmology

## 2021-05-27 ENCOUNTER — Other Ambulatory Visit: Payer: Self-pay

## 2021-05-27 DIAGNOSIS — H43813 Vitreous degeneration, bilateral: Secondary | ICD-10-CM | POA: Diagnosis not present

## 2021-05-27 DIAGNOSIS — I10 Essential (primary) hypertension: Secondary | ICD-10-CM | POA: Diagnosis not present

## 2021-05-27 DIAGNOSIS — H35033 Hypertensive retinopathy, bilateral: Secondary | ICD-10-CM

## 2021-05-27 DIAGNOSIS — E113413 Type 2 diabetes mellitus with severe nonproliferative diabetic retinopathy with macular edema, bilateral: Secondary | ICD-10-CM

## 2021-06-08 DIAGNOSIS — I130 Hypertensive heart and chronic kidney disease with heart failure and stage 1 through stage 4 chronic kidney disease, or unspecified chronic kidney disease: Secondary | ICD-10-CM | POA: Diagnosis not present

## 2021-06-08 DIAGNOSIS — G4700 Insomnia, unspecified: Secondary | ICD-10-CM | POA: Diagnosis not present

## 2021-06-08 DIAGNOSIS — L97509 Non-pressure chronic ulcer of other part of unspecified foot with unspecified severity: Secondary | ICD-10-CM | POA: Diagnosis not present

## 2021-06-08 DIAGNOSIS — F330 Major depressive disorder, recurrent, mild: Secondary | ICD-10-CM | POA: Diagnosis not present

## 2021-06-08 DIAGNOSIS — E11621 Type 2 diabetes mellitus with foot ulcer: Secondary | ICD-10-CM | POA: Diagnosis not present

## 2021-06-08 DIAGNOSIS — I5032 Chronic diastolic (congestive) heart failure: Secondary | ICD-10-CM | POA: Diagnosis not present

## 2021-06-08 DIAGNOSIS — N1831 Chronic kidney disease, stage 3a: Secondary | ICD-10-CM | POA: Diagnosis not present

## 2021-06-08 DIAGNOSIS — L97321 Non-pressure chronic ulcer of left ankle limited to breakdown of skin: Secondary | ICD-10-CM | POA: Diagnosis not present

## 2021-06-23 DIAGNOSIS — M1712 Unilateral primary osteoarthritis, left knee: Secondary | ICD-10-CM | POA: Diagnosis not present

## 2021-06-23 DIAGNOSIS — M1711 Unilateral primary osteoarthritis, right knee: Secondary | ICD-10-CM | POA: Diagnosis not present

## 2021-06-23 DIAGNOSIS — M25562 Pain in left knee: Secondary | ICD-10-CM | POA: Diagnosis not present

## 2021-06-24 ENCOUNTER — Other Ambulatory Visit: Payer: Self-pay

## 2021-06-24 ENCOUNTER — Encounter (INDEPENDENT_AMBULATORY_CARE_PROVIDER_SITE_OTHER): Payer: Medicare PPO | Admitting: Ophthalmology

## 2021-06-24 DIAGNOSIS — E113411 Type 2 diabetes mellitus with severe nonproliferative diabetic retinopathy with macular edema, right eye: Secondary | ICD-10-CM | POA: Diagnosis not present

## 2021-06-24 DIAGNOSIS — H35033 Hypertensive retinopathy, bilateral: Secondary | ICD-10-CM

## 2021-06-24 DIAGNOSIS — I10 Essential (primary) hypertension: Secondary | ICD-10-CM

## 2021-06-24 DIAGNOSIS — H43813 Vitreous degeneration, bilateral: Secondary | ICD-10-CM

## 2021-06-24 DIAGNOSIS — E113312 Type 2 diabetes mellitus with moderate nonproliferative diabetic retinopathy with macular edema, left eye: Secondary | ICD-10-CM | POA: Diagnosis not present

## 2021-07-11 DIAGNOSIS — G3184 Mild cognitive impairment, so stated: Secondary | ICD-10-CM | POA: Diagnosis not present

## 2021-07-11 DIAGNOSIS — I252 Old myocardial infarction: Secondary | ICD-10-CM | POA: Diagnosis not present

## 2021-07-11 DIAGNOSIS — I739 Peripheral vascular disease, unspecified: Secondary | ICD-10-CM | POA: Diagnosis not present

## 2021-07-11 DIAGNOSIS — Z720 Tobacco use: Secondary | ICD-10-CM | POA: Diagnosis not present

## 2021-07-11 DIAGNOSIS — G629 Polyneuropathy, unspecified: Secondary | ICD-10-CM | POA: Diagnosis not present

## 2021-07-11 DIAGNOSIS — Z8249 Family history of ischemic heart disease and other diseases of the circulatory system: Secondary | ICD-10-CM | POA: Diagnosis not present

## 2021-07-11 DIAGNOSIS — Z811 Family history of alcohol abuse and dependence: Secondary | ICD-10-CM | POA: Diagnosis not present

## 2021-07-11 DIAGNOSIS — R32 Unspecified urinary incontinence: Secondary | ICD-10-CM | POA: Diagnosis not present

## 2021-07-11 DIAGNOSIS — Z7409 Other reduced mobility: Secondary | ICD-10-CM | POA: Diagnosis not present

## 2021-07-22 ENCOUNTER — Encounter (INDEPENDENT_AMBULATORY_CARE_PROVIDER_SITE_OTHER): Payer: Medicare PPO | Admitting: Ophthalmology

## 2021-07-22 DIAGNOSIS — H35033 Hypertensive retinopathy, bilateral: Secondary | ICD-10-CM

## 2021-07-22 DIAGNOSIS — H43813 Vitreous degeneration, bilateral: Secondary | ICD-10-CM

## 2021-07-22 DIAGNOSIS — I10 Essential (primary) hypertension: Secondary | ICD-10-CM

## 2021-07-22 DIAGNOSIS — E113312 Type 2 diabetes mellitus with moderate nonproliferative diabetic retinopathy with macular edema, left eye: Secondary | ICD-10-CM | POA: Diagnosis not present

## 2021-07-22 DIAGNOSIS — E113411 Type 2 diabetes mellitus with severe nonproliferative diabetic retinopathy with macular edema, right eye: Secondary | ICD-10-CM | POA: Diagnosis not present

## 2021-07-27 DIAGNOSIS — E1151 Type 2 diabetes mellitus with diabetic peripheral angiopathy without gangrene: Secondary | ICD-10-CM | POA: Diagnosis not present

## 2021-07-27 DIAGNOSIS — G4733 Obstructive sleep apnea (adult) (pediatric): Secondary | ICD-10-CM | POA: Diagnosis not present

## 2021-07-27 DIAGNOSIS — I700 Atherosclerosis of aorta: Secondary | ICD-10-CM | POA: Diagnosis not present

## 2021-07-27 DIAGNOSIS — I25118 Atherosclerotic heart disease of native coronary artery with other forms of angina pectoris: Secondary | ICD-10-CM | POA: Diagnosis not present

## 2021-07-27 DIAGNOSIS — J449 Chronic obstructive pulmonary disease, unspecified: Secondary | ICD-10-CM | POA: Diagnosis not present

## 2021-07-27 DIAGNOSIS — I5032 Chronic diastolic (congestive) heart failure: Secondary | ICD-10-CM | POA: Diagnosis not present

## 2021-07-27 DIAGNOSIS — G40909 Epilepsy, unspecified, not intractable, without status epilepticus: Secondary | ICD-10-CM | POA: Diagnosis not present

## 2021-07-27 DIAGNOSIS — I130 Hypertensive heart and chronic kidney disease with heart failure and stage 1 through stage 4 chronic kidney disease, or unspecified chronic kidney disease: Secondary | ICD-10-CM | POA: Diagnosis not present

## 2021-08-19 ENCOUNTER — Encounter (INDEPENDENT_AMBULATORY_CARE_PROVIDER_SITE_OTHER): Payer: Medicare PPO | Admitting: Ophthalmology

## 2021-08-24 ENCOUNTER — Encounter (INDEPENDENT_AMBULATORY_CARE_PROVIDER_SITE_OTHER): Payer: Medicare PPO | Admitting: Ophthalmology

## 2021-09-07 ENCOUNTER — Encounter (INDEPENDENT_AMBULATORY_CARE_PROVIDER_SITE_OTHER): Payer: Medicare PPO | Admitting: Ophthalmology

## 2021-09-09 ENCOUNTER — Encounter (INDEPENDENT_AMBULATORY_CARE_PROVIDER_SITE_OTHER): Payer: Medicare PPO | Admitting: Ophthalmology

## 2021-09-22 ENCOUNTER — Encounter (INDEPENDENT_AMBULATORY_CARE_PROVIDER_SITE_OTHER): Payer: Self-pay

## 2021-09-22 ENCOUNTER — Encounter (INDEPENDENT_AMBULATORY_CARE_PROVIDER_SITE_OTHER): Payer: Medicare PPO | Admitting: Ophthalmology

## 2021-09-23 DIAGNOSIS — H1030 Unspecified acute conjunctivitis, unspecified eye: Secondary | ICD-10-CM | POA: Diagnosis not present

## 2021-11-02 ENCOUNTER — Encounter (INDEPENDENT_AMBULATORY_CARE_PROVIDER_SITE_OTHER): Payer: Medicare PPO | Admitting: Ophthalmology

## 2021-11-02 DIAGNOSIS — H35033 Hypertensive retinopathy, bilateral: Secondary | ICD-10-CM

## 2021-11-02 DIAGNOSIS — E113411 Type 2 diabetes mellitus with severe nonproliferative diabetic retinopathy with macular edema, right eye: Secondary | ICD-10-CM | POA: Diagnosis not present

## 2021-11-02 DIAGNOSIS — H43813 Vitreous degeneration, bilateral: Secondary | ICD-10-CM | POA: Diagnosis not present

## 2021-11-02 DIAGNOSIS — E113312 Type 2 diabetes mellitus with moderate nonproliferative diabetic retinopathy with macular edema, left eye: Secondary | ICD-10-CM | POA: Diagnosis not present

## 2021-11-02 DIAGNOSIS — I10 Essential (primary) hypertension: Secondary | ICD-10-CM | POA: Diagnosis not present

## 2021-11-11 DIAGNOSIS — M1712 Unilateral primary osteoarthritis, left knee: Secondary | ICD-10-CM | POA: Diagnosis not present

## 2021-11-18 DIAGNOSIS — M1712 Unilateral primary osteoarthritis, left knee: Secondary | ICD-10-CM | POA: Diagnosis not present

## 2021-11-25 DIAGNOSIS — M1712 Unilateral primary osteoarthritis, left knee: Secondary | ICD-10-CM | POA: Diagnosis not present

## 2021-11-30 ENCOUNTER — Encounter (INDEPENDENT_AMBULATORY_CARE_PROVIDER_SITE_OTHER): Payer: Medicare PPO | Admitting: Ophthalmology

## 2021-11-30 DIAGNOSIS — H35033 Hypertensive retinopathy, bilateral: Secondary | ICD-10-CM | POA: Diagnosis not present

## 2021-11-30 DIAGNOSIS — I10 Essential (primary) hypertension: Secondary | ICD-10-CM | POA: Diagnosis not present

## 2021-11-30 DIAGNOSIS — H43813 Vitreous degeneration, bilateral: Secondary | ICD-10-CM

## 2021-11-30 DIAGNOSIS — E113413 Type 2 diabetes mellitus with severe nonproliferative diabetic retinopathy with macular edema, bilateral: Secondary | ICD-10-CM | POA: Diagnosis not present

## 2021-12-28 ENCOUNTER — Encounter (INDEPENDENT_AMBULATORY_CARE_PROVIDER_SITE_OTHER): Payer: Medicare PPO | Admitting: Ophthalmology

## 2022-01-04 ENCOUNTER — Encounter (INDEPENDENT_AMBULATORY_CARE_PROVIDER_SITE_OTHER): Payer: Medicare PPO | Admitting: Ophthalmology

## 2022-01-04 DIAGNOSIS — H35033 Hypertensive retinopathy, bilateral: Secondary | ICD-10-CM

## 2022-01-04 DIAGNOSIS — E113413 Type 2 diabetes mellitus with severe nonproliferative diabetic retinopathy with macular edema, bilateral: Secondary | ICD-10-CM | POA: Diagnosis not present

## 2022-01-04 DIAGNOSIS — H43813 Vitreous degeneration, bilateral: Secondary | ICD-10-CM | POA: Diagnosis not present

## 2022-01-04 DIAGNOSIS — I10 Essential (primary) hypertension: Secondary | ICD-10-CM | POA: Diagnosis not present

## 2022-02-01 ENCOUNTER — Encounter (INDEPENDENT_AMBULATORY_CARE_PROVIDER_SITE_OTHER): Payer: Medicare PPO | Admitting: Ophthalmology

## 2022-02-12 ENCOUNTER — Encounter (INDEPENDENT_AMBULATORY_CARE_PROVIDER_SITE_OTHER): Payer: Medicare PPO | Admitting: Ophthalmology

## 2022-02-12 DIAGNOSIS — H35033 Hypertensive retinopathy, bilateral: Secondary | ICD-10-CM

## 2022-02-12 DIAGNOSIS — I10 Essential (primary) hypertension: Secondary | ICD-10-CM

## 2022-02-12 DIAGNOSIS — E113413 Type 2 diabetes mellitus with severe nonproliferative diabetic retinopathy with macular edema, bilateral: Secondary | ICD-10-CM

## 2022-02-12 DIAGNOSIS — H43813 Vitreous degeneration, bilateral: Secondary | ICD-10-CM | POA: Diagnosis not present

## 2022-02-19 DIAGNOSIS — Z79899 Other long term (current) drug therapy: Secondary | ICD-10-CM | POA: Diagnosis not present

## 2022-02-19 DIAGNOSIS — E1122 Type 2 diabetes mellitus with diabetic chronic kidney disease: Secondary | ICD-10-CM | POA: Diagnosis not present

## 2022-02-19 DIAGNOSIS — E1151 Type 2 diabetes mellitus with diabetic peripheral angiopathy without gangrene: Secondary | ICD-10-CM | POA: Diagnosis not present

## 2022-02-19 DIAGNOSIS — E1142 Type 2 diabetes mellitus with diabetic polyneuropathy: Secondary | ICD-10-CM | POA: Diagnosis not present

## 2022-02-19 DIAGNOSIS — Z0001 Encounter for general adult medical examination with abnormal findings: Secondary | ICD-10-CM | POA: Diagnosis not present

## 2022-02-19 DIAGNOSIS — Z23 Encounter for immunization: Secondary | ICD-10-CM | POA: Diagnosis not present

## 2022-02-19 DIAGNOSIS — I700 Atherosclerosis of aorta: Secondary | ICD-10-CM | POA: Diagnosis not present

## 2022-02-19 DIAGNOSIS — H18413 Arcus senilis, bilateral: Secondary | ICD-10-CM | POA: Diagnosis not present

## 2022-03-15 ENCOUNTER — Encounter (INDEPENDENT_AMBULATORY_CARE_PROVIDER_SITE_OTHER): Payer: Medicare PPO | Admitting: Ophthalmology

## 2022-03-15 DIAGNOSIS — I10 Essential (primary) hypertension: Secondary | ICD-10-CM

## 2022-03-15 DIAGNOSIS — H35033 Hypertensive retinopathy, bilateral: Secondary | ICD-10-CM

## 2022-03-15 DIAGNOSIS — E113413 Type 2 diabetes mellitus with severe nonproliferative diabetic retinopathy with macular edema, bilateral: Secondary | ICD-10-CM | POA: Diagnosis not present

## 2022-03-15 DIAGNOSIS — H43813 Vitreous degeneration, bilateral: Secondary | ICD-10-CM | POA: Diagnosis not present

## 2022-04-08 ENCOUNTER — Ambulatory Visit: Payer: Medicare PPO | Admitting: Podiatry

## 2022-04-20 ENCOUNTER — Ambulatory Visit (INDEPENDENT_AMBULATORY_CARE_PROVIDER_SITE_OTHER): Payer: Medicare PPO

## 2022-04-20 ENCOUNTER — Encounter: Payer: Self-pay | Admitting: Podiatry

## 2022-04-20 ENCOUNTER — Ambulatory Visit (INDEPENDENT_AMBULATORY_CARE_PROVIDER_SITE_OTHER): Payer: Medicare PPO | Admitting: Podiatry

## 2022-04-20 DIAGNOSIS — N289 Disorder of kidney and ureter, unspecified: Secondary | ICD-10-CM | POA: Diagnosis not present

## 2022-04-20 DIAGNOSIS — M79674 Pain in right toe(s): Secondary | ICD-10-CM

## 2022-04-20 DIAGNOSIS — E0843 Diabetes mellitus due to underlying condition with diabetic autonomic (poly)neuropathy: Secondary | ICD-10-CM | POA: Diagnosis not present

## 2022-04-20 DIAGNOSIS — B351 Tinea unguium: Secondary | ICD-10-CM

## 2022-04-20 DIAGNOSIS — E11622 Type 2 diabetes mellitus with other skin ulcer: Secondary | ICD-10-CM | POA: Diagnosis not present

## 2022-04-20 DIAGNOSIS — L97422 Non-pressure chronic ulcer of left heel and midfoot with fat layer exposed: Secondary | ICD-10-CM | POA: Diagnosis not present

## 2022-04-20 DIAGNOSIS — L97301 Non-pressure chronic ulcer of unspecified ankle limited to breakdown of skin: Secondary | ICD-10-CM

## 2022-04-20 DIAGNOSIS — E08621 Diabetes mellitus due to underlying condition with foot ulcer: Secondary | ICD-10-CM

## 2022-04-20 DIAGNOSIS — M79675 Pain in left toe(s): Secondary | ICD-10-CM

## 2022-04-20 MED ORDER — GENTAMICIN SULFATE 0.1 % EX OINT: 1.0000 | TOPICAL_OINTMENT | Freq: Every day | CUTANEOUS | 0 refills | Status: DC

## 2022-04-20 NOTE — Progress Notes (Unsigned)
  Subjective:  Patient ID: [name], female    DOB: 15-May-1940,  MRN: [id number]  Chief Complaint  Patient presents with   Nail Problem    [facility]   Diabetic Ulcer    82 y.o. female presents with the above complaint. History confirmed with patient. She is not sure how long the ulcer has been there.  She is a 2 pack/day smoker.  She does not know her A1c.  Objective:  Physical Exam: warm, good capillary refill and nonpalpable pulses in both feet, she has mild to moderate edema.  Ulceration full-thickness on the lateral dorsal fifth metatarsal head fibrous wound bed no purulent drainage no cellulitis no malodor.     Radiographs: Multiple views x-ray of the left foot: no fracture, dislocation, swelling or degenerative changes noted and no soft tissue emphysema, no signs of osteomyelitis Assessment:   1. Diabetic ulcer of left midfoot associated with diabetes mellitus due to underlying condition, with fat layer exposed ([place])      Plan:  Patient was evaluated and treated and all questions answered.  Ulcer left foot -We discussed the etiology and factors that are a part of the wound healing process.  We also discussed the risk of infection both soft tissue and osteomyelitis from open ulceration.  Discussed the risk of limb loss if this happens or worsens. -We discussed the impact of smoking on wound healing and I recommended cessation of this -Dressed with Iodosorb, DSD. -Continue home dressing changes daily with 4 x 4 gauze and gentamicin ointment, Rx sent to pharmacy, use reviewed -Vascular testing ABI ordered -HgbA1c: Last A1c she will let me know what she thinks it is -Last antibiotics: Do not see indication for oral antibiotics at this point there are no systemic or local signs of infection -Imaging: x-ray reviewed, shows no signs of erosions, osteolysis, osteomyelitis or emphysema.    Return in about 3 months (around 07/20/2022) for nail care.

## 2022-04-21 ENCOUNTER — Encounter (INDEPENDENT_AMBULATORY_CARE_PROVIDER_SITE_OTHER): Payer: Medicare PPO | Admitting: Ophthalmology

## 2022-04-21 DIAGNOSIS — I10 Essential (primary) hypertension: Secondary | ICD-10-CM

## 2022-04-21 DIAGNOSIS — H43813 Vitreous degeneration, bilateral: Secondary | ICD-10-CM | POA: Diagnosis not present

## 2022-04-21 DIAGNOSIS — H35033 Hypertensive retinopathy, bilateral: Secondary | ICD-10-CM | POA: Diagnosis not present

## 2022-04-21 DIAGNOSIS — E113413 Type 2 diabetes mellitus with severe nonproliferative diabetic retinopathy with macular edema, bilateral: Secondary | ICD-10-CM | POA: Diagnosis not present

## 2022-04-29 ENCOUNTER — Ambulatory Visit (HOSPITAL_COMMUNITY): Payer: Medicare PPO

## 2022-05-03 ENCOUNTER — Ambulatory Visit (HOSPITAL_COMMUNITY)
Admission: RE | Admit: 2022-05-03 | Discharge: 2022-05-03 | Disposition: A | Discharge status: Home / Self Care | Payer: Medicare PPO | Source: Ambulatory Visit | Attending: Podiatry | Admitting: Podiatry

## 2022-05-03 DIAGNOSIS — E08621 Diabetes mellitus due to underlying condition with foot ulcer: Secondary | ICD-10-CM | POA: Diagnosis not present

## 2022-05-03 DIAGNOSIS — L97422 Non-pressure chronic ulcer of left heel and midfoot with fat layer exposed: Secondary | ICD-10-CM

## 2022-05-03 LAB — VAS US PAD ABI
Left ABI: 0.75
Right ABI: 0.59

## 2022-05-04 ENCOUNTER — Other Ambulatory Visit: Payer: Self-pay

## 2022-05-04 DIAGNOSIS — E08621 Diabetes mellitus due to underlying condition with foot ulcer: Secondary | ICD-10-CM

## 2022-05-10 ENCOUNTER — Ambulatory Visit: Payer: Medicare PPO | Admitting: Podiatry

## 2022-05-11 ENCOUNTER — Ambulatory Visit (INDEPENDENT_AMBULATORY_CARE_PROVIDER_SITE_OTHER): Payer: Medicare PPO | Admitting: Podiatry

## 2022-05-11 DIAGNOSIS — L97422 Non-pressure chronic ulcer of left heel and midfoot with fat layer exposed: Secondary | ICD-10-CM

## 2022-05-11 DIAGNOSIS — E08621 Diabetes mellitus due to underlying condition with foot ulcer: Secondary | ICD-10-CM

## 2022-05-13 ENCOUNTER — Encounter: Payer: Self-pay | Admitting: Vascular Surgery

## 2022-05-13 ENCOUNTER — Ambulatory Visit: Payer: Medicare PPO | Admitting: Vascular Surgery

## 2022-05-13 ENCOUNTER — Other Ambulatory Visit: Payer: Self-pay

## 2022-05-13 VITALS — BP 146/62 | HR 65 | Temp 97.7°F | Resp 20 | Ht 66.0 in | Wt 330.0 lb

## 2022-05-13 DIAGNOSIS — L97909 Non-pressure chronic ulcer of unspecified part of unspecified lower leg with unspecified severity: Secondary | ICD-10-CM

## 2022-05-13 DIAGNOSIS — F17218 Nicotine dependence, cigarettes, with other nicotine-induced disorders: Secondary | ICD-10-CM | POA: Diagnosis not present

## 2022-05-13 DIAGNOSIS — I70299 Other atherosclerosis of native arteries of extremities, unspecified extremity: Secondary | ICD-10-CM | POA: Diagnosis not present

## 2022-05-13 NOTE — H&P (View-Only) (Signed)
 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  This is clearly a limb threatening problem.  We have had a long discussion about the importance of tobacco cessation (3 min) and how this causes vasospasm in the microcirculation.  I have encouraged her to try to get off tobacco completely.  I have recommended that we proceed with arteriography to further evaluate her infrainguinal arterial occlusive disease.  If she has a disease amenable to angioplasty this could be addressed at the same time.  If she were to require a bypass she would clearly need preoperative cardiac clearance given her significant history.  Given her multiple medical comorbidities and obesity she would be at high risk for surgical intervention.  I have reviewed with the patient the indications for arteriography. In addition, I have reviewed the potential complications of arteriography including but not limited to: Bleeding, arterial injury, arterial thrombosis, dye action, renal insufficiency, or other unpredictable medical problems. I have explained to the patient that if we find disease amenable to angioplasty we could potentially address this at the same time. I have discussed the potential complications of angioplasty and stenting, including but not limited to: Bleeding, arterial thrombosis, arterial injury, dissection, or the need for surgical intervention.  Pending the results of her labs we may have to use CO2 with limited contrast.  Her arteriogram has been scheduled for tomorrow morning.   REASON FOR CONSULT:    Left foot ulcer.  The consult is requested by Dr. Adam McDonald.   HPI:   [name] is a 81 y.o. female who was referred with an ulcer on her left foot.  She tells me that she has had this for about 2 weeks.  She does  not remember any specific injury to the foot.  She does not remember having any new shoes which did not fit properly.  She said she has had previous ulcers but typically these heal.  Her activity is very limited because of her obesity.  However, she does describe claudication in both calves.  She experiences pain which is brought on by ambulation and relieved with rest.  She also describes some pain in her feet at night although I am not sure this represents rest pain as it does not appear to be related to dependency.   Her risk factors for peripheral arterial disease include type 2 diabetes, hypertension, hypercholesterolemia, and tobacco use.  She smokes over a pack per day of cigarettes.  She denies any family history of premature cardiovascular disease.  She has multiple medical comorbidities including chronic diastolic congestive heart failure, history of a remote MI, COPD, cirrhosis, and chronic kidney disease.  She is on aspirin and is on a statin.  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     CHF (congestive heart failure) (HCC)    Cholelithiasis    Chronic back pain      CIRRHOSIS 10/29/2006   COLONIC POLYPS 09/13/2007   COPD 04/22/2009   DEGENERATIVE JOINT DISEASE 06/15/2007   DEPRESSION 02/22/2008   Depression    Diabetes mellitus (HCC)    Diverticulosis    Fatty liver    Gastritis    Gastroparesis    GERD 10/29/2006   Headache(784.0)    "not everyday now" (11/22/2012)   Heart failure with preserved ejection fraction (HCC)    Hepatic cirrhosis (HCC)     HYPERCHOLESTEROLEMIA 02/25/2009   Hyperlipidemia    HYPERTENSION 12/20/2008   Hypertension    Migraine    Myocardial infarction (HCC)    Neuropathy    Obesity    OSA (obstructive sleep apnea)    "quit wearing my CPAP" (11/22/2012)   OSTEOPOROSIS 10/29/2006   SEIZURE DISORDER    "silent seizures" (11/22/2012)   Shortness of breath    "off and on" (11/22/2012)   Stroke (HCC) 04/2004   /notess 04/22/2004; "still weaker on the right" (11/22/2012)   Stroke (HCC)    Type II diabetes mellitus (HCC)     Family History  Problem Relation Age of Onset   Ovarian cancer Mother    Diabetes Mother    Heart disease Mother    Colon cancer Mother    Diabetes Other    Heart disease Father    Heart disease Maternal Grandmother    Heart disease Sister    Clotting disorder Sister     SOCIAL HISTORY: Social History   Tobacco Use   Smoking status: Every Day    Packs/day: 1.00    Types: Cigarettes   Smokeless tobacco: Never  Substance Use Topics   Alcohol use: Not Currently    Allergies  Allergen Reactions   Hydrocodone Other (See Comments)    Hallucination   Lisinopril Cough   Pioglitazone Swelling    edema   Varenicline Tartrate Other (See Comments)    Bad dreams    Current Outpatient Medications  Medication Sig Dispense Refill   acetaminophen (TYLENOL) 500 MG tablet Take 500 mg by mouth daily as needed for mild pain.     albuterol (PROVENTIL HFA) 108 (90 Base) MCG/ACT inhaler Inhale 2 puffs into the lungs every 4 (four) hours as needed for wheezing or shortness of breath. 1 Inhaler 3   albuterol (PROVENTIL) (2.5 MG/3ML) 0.083% nebulizer solution Take 3 mLs (2.5 mg total) by nebulization 3 (three) times daily. 75 mL 3   amLODipine (NORVASC) 5 MG tablet TAKE 1 TABLET(5 MG) BY MOUTH DAILY (Patient taking differently: Take 5 mg by mouth daily.) 30 tablet 2   aspirin EC 81 MG tablet Take 81 mg by mouth daily.     Aspirin-Salicylamide-Caffeine (BC HEADACHE POWDER PO) Take 2 packets by mouth  every evening.     budesonide-formoterol (SYMBICORT) 160-4.5 MCG/ACT inhaler Inhale 2 puffs into the lungs 2 (two) times daily. 10.2 g 0   buPROPion (WELLBUTRIN XL) 300 MG 24 hr tablet Take 1 tablet (300 mg total) by mouth every morning. 30 tablet 3   ciprofloxacin (CILOXAN) 0.3 % ophthalmic solution Place 1 drop into both eyes in the morning and at bedtime.      diclofenac Sodium (VOLTAREN) 1 % GEL Apply 2 g topically 4 (four) times daily. 150 g 0   doxycycline (VIBRA-TABS) 100 MG tablet Take 1 tablet (100 mg total) by mouth 2 (two) times daily. 20 tablet 0   erythromycin ophthalmic ointment Place 1 application into both eyes 3 (three) times daily.      furosemide (  LASIX) 20 MG tablet Take 20 mg by mouth at bedtime.      gabapentin (NEURONTIN) 300 MG capsule TAKE 1 CAPSULE(300 MG) BY MOUTH TWICE DAILY (Patient taking differently: Take 300 mg by mouth daily.) 180 capsule 0   gentamicin ointment (GARAMYCIN) 0.1 % Apply 1 Application topically daily. 30 g 0   glucose blood (ONETOUCH VERIO) test strip 1 each by Other route 2 (two) times daily. And lancets 2/day 100 each 12   HYDROcodone-acetaminophen (NORCO/VICODIN) 5-325 MG tablet Take 1 tablet by mouth every 6 (six) hours as needed for moderate pain. 30 tablet 0   Hyprom-Naphaz-Polysorb-Zn Sulf (CLEAR EYES COMPLETE OP) Place 1 drop into both eyes in the morning, at noon, and at bedtime.      Insulin Glargine (BASAGLAR KWIKPEN) 100 UNIT/ML INJECT 120 UNITS INTO THE SKIN DAILY 30 mL 0   insulin lispro (HUMALOG) 100 UNIT/ML KwikPen Inject 0.5 mLs (50 Units total) into the skin daily with supper. And pen needles 3/day (Patient taking differently: Inject 15 Units into the skin daily with supper.) 10 pen 11   ipratropium-albuterol (DUONEB) 0.5-2.5 (3) MG/3ML SOLN Take 3 mLs by nebulization every 6 (six) hours as needed. 360 mL 2   ketoconazole (NIZORAL) 2 % cream Apply 1 application topically daily. 120 g 3   losartan-hydrochlorothiazide (HYZAAR) 50-12.5  MG tablet Take 1 tablet by mouth daily. 30 tablet 11   mirtazapine (REMERON) 15 MG tablet Take 15 mg by mouth at bedtime.      nystatin (NYSTATIN) powder 1 application daily as needed (rash/sweating under breasts).      omeprazole (PRILOSEC) 40 MG capsule TAKE 1 CAPSULE(40 MG) BY MOUTH DAILY (Patient taking differently: Take 40 mg by mouth daily.) 90 capsule 0   ondansetron (ZOFRAN) 4 MG tablet TAKE 1 TABLET(4 MG) BY MOUTH EVERY 8 HOURS AS NEEDED FOR NAUSEA OR VOMITING 20 tablet 5   PAZEO 0.7 % SOLN Place 1 drop into both eyes daily.      polyethylene glycol (MIRALAX / GLYCOLAX) packet Take 17 g by mouth 2 (two) times daily. (Patient taking differently: Take 17 g by mouth daily as needed.) 60 each 1   silver sulfADIAZINE (SILVADENE) 1 % cream Apply 1 application topically daily. 50 g 0   triamcinolone cream (KENALOG) 0.1 % Apply 1 application topically 2 (two) times a day. Apply to rash on face     atorvastatin (LIPITOR) 40 MG tablet Take 1 tablet (40 mg total) by mouth daily. 30 tablet 11   levETIRAcetam (KEPPRA) 500 MG tablet Take 1 tablet (500 mg total) by mouth 2 (two) times daily. 60 tablet 0   No current facility-administered medications for this visit.    REVIEW OF SYSTEMS:  [X] denotes positive finding, [ ] denotes negative finding Cardiac  Comments:  Chest pain or chest pressure: x   Shortness of breath upon exertion: x   Short of breath when lying flat:    Irregular heart rhythm:        Vascular    Pain in calf, thigh, or hip brought on by ambulation: x   Pain in feet at night that wakes you up from your sleep:  x   Blood clot in your veins:    Leg swelling:  x       Pulmonary    Oxygen at home:    Productive cough:     Wheezing:         Neurologic    Sudden weakness in arms or legs:    x   Sudden numbness in arms or legs:     Sudden onset of difficulty speaking or slurred speech:    Temporary loss of vision in one eye:     Problems with dizziness:          Gastrointestinal    Blood in stool:     Vomited blood:         Genitourinary    Burning when urinating:     Blood in urine:        Psychiatric    Major depression:         Hematologic    Bleeding problems:    Problems with blood clotting too easily:        Skin    Rashes or ulcers: x       Constitutional    Fever or chills:    -  PHYSICAL EXAM:   Vitals:   05/13/22 1015  BP: (!) 146/62  Pulse: 65  Resp: 20  Temp: 97.7 F (36.5 C)  SpO2: 92%  Weight: (!) 330 lb (149.7 kg)  Height: 5' 6" (1.676 m)   Body mass index is 53.26 kg/m.  GENERAL: The patient is a well-nourished female, in no acute distress. The vital signs are documented above. CARDIAC: There is a regular rate and rhythm.  VASCULAR: I do not detect carotid bruits. On the right side she has a palpable femoral pulse and popliteal pulse.  She has a monophasic dorsalis pedis signal.  She has a brisk but monophasic posterior tibial signal. On the left side, which is the side with an ulcer, she has a palpable femoral pulse.  I cannot palpate a popliteal or pedal pulse.  She does have a biphasic posterior tibial signal with the Doppler and a monophasic dorsalis pedis signal. She has some bilateral lower extremity swelling and also some hyperpigmentation to suggest underlying chronic venous insufficiency. PULMONARY: There is good air exchange bilaterally without wheezing or rales. ABDOMEN: Soft and non-tender with normal pitched bowel sounds.  MUSCULOSKELETAL: There are no major deformities. NEUROLOGIC: No focal weakness or paresthesias are detected. SKIN: She has a wound on the lateral aspect of her left foot which measures about 1-1/2 cm in width by 2 cm in length.     PSYCHIATRIC: The patient has a normal affect.  DATA:    ARTERIAL DOPPLER STUDY: I have independently interpreted the arterial Doppler study that was done on 05/03/2022.  On the right side there is a brisk but monophasic dorsalis pedis and  posterior tibial signal with the Doppler.  ABIs 59%.  Toe pressures 111 mmHg.  On the left side, which is the symptomatic side, there is a biphasic posterior tibial signal with a monophasic dorsalis pedis signal.  ABIs 75%.  Toe pressures 118 mmHg.  LABS: I am unable to locate any recent labs.   [name] [facility] 

## 2022-05-13 NOTE — Progress Notes (Addendum)
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  This is clearly a limb threatening problem.  We have had a long discussion about the importance of tobacco cessation (3 min) and how this causes vasospasm in the microcirculation.  I have encouraged her to try to get off tobacco completely.  I have recommended that we proceed with arteriography to further evaluate her infrainguinal arterial occlusive disease.  If she has a disease amenable to angioplasty this could be addressed at the same time.  If she were to require a bypass she would clearly need preoperative cardiac clearance given her significant history.  Given her multiple medical comorbidities and obesity she would be at high risk for surgical intervention.  I have reviewed with the patient the indications for arteriography. In addition, I have reviewed the potential complications of arteriography including but not limited to: Bleeding, arterial injury, arterial thrombosis, dye action, renal insufficiency, or other unpredictable medical problems. I have explained to the patient that if we find disease amenable to angioplasty we could potentially address this at the same time. I have discussed the potential complications of angioplasty and stenting, including but not limited to: Bleeding, arterial thrombosis, arterial injury, dissection, or the need for surgical intervention.  Pending the results of her labs we may have to use CO2 with limited contrast.  Her arteriogram has been scheduled for tomorrow morning.   REASON FOR CONSULT:    Left foot ulcer.  The consult is requested by Dr. Lanae Crumbly.   HPI:   [name] is a 82 y.o. female who was referred with an ulcer on her left foot.  She tells me that she has had this for about 2 weeks.  She does  not remember any specific injury to the foot.  She does not remember having any new shoes which did not fit properly.  She said she has had previous ulcers but typically these heal.  Her activity is very limited because of her obesity.  However, she does describe claudication in both calves.  She experiences pain which is brought on by ambulation and relieved with rest.  She also describes some pain in her feet at night although I am not sure this represents rest pain as it does not appear to be related to dependency.   Her risk factors for peripheral arterial disease include type 2 diabetes, hypertension, hypercholesterolemia, and tobacco use.  She smokes over a pack per day of cigarettes.  She denies any family history of premature cardiovascular disease.  She has multiple medical comorbidities including chronic diastolic congestive heart failure, history of a remote MI, COPD, cirrhosis, and chronic kidney disease.  She is on aspirin and is on a statin.  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     CHF (congestive heart failure) (HCC)    Cholelithiasis    Chronic back pain  CIRRHOSIS 10/29/2006   COLONIC POLYPS 09/13/2007   COPD 04/22/2009   DEGENERATIVE JOINT DISEASE 06/15/2007   DEPRESSION 02/22/2008   Depression    Diabetes mellitus ([place])    Diverticulosis    Fatty liver    Gastritis    Gastroparesis    GERD 10/29/2006   Headache(784.0)    "not everyday now" (11/22/2012)   Heart failure with preserved ejection fraction (HCC)    Hepatic cirrhosis ([place])     HYPERCHOLESTEROLEMIA 02/25/2009   Hyperlipidemia    HYPERTENSION 12/20/2008   Hypertension    Migraine    Myocardial infarction ([place])    Neuropathy    Obesity    OSA (obstructive sleep apnea)    "quit wearing my CPAP" (11/22/2012)   OSTEOPOROSIS 10/29/2006   SEIZURE DISORDER    "silent seizures" (11/22/2012)   Shortness of breath    "off and on" (11/22/2012)   Stroke ([place]) 04/2004   [place] 04/22/2004; "still weaker on the right" (11/22/2012)   Stroke ([place])    Type II diabetes mellitus ([place])     Family History  Problem Relation Age of Onset   Ovarian cancer Mother    Diabetes Mother    Heart disease Mother    Colon cancer Mother    Diabetes Other    Heart disease Father    Heart disease Maternal Grandmother    Heart disease Sister    Clotting disorder Sister     SOCIAL HISTORY: Social History   Tobacco Use   Smoking status: Every Day    Packs/day: 1.00    Types: Cigarettes   Smokeless tobacco: Never  Substance Use Topics   Alcohol use: Not Currently    Allergies  Allergen Reactions   Hydrocodone Other (See Comments)    Hallucination   Lisinopril Cough   Pioglitazone Swelling    edema   Varenicline Tartrate Other (See Comments)    Bad dreams    Current Outpatient Medications  Medication Sig Dispense Refill   acetaminophen (TYLENOL) 500 MG tablet Take 500 mg by mouth daily as needed for mild pain.     albuterol (PROVENTIL HFA) 108 (90 Base) MCG/ACT inhaler Inhale 2 puffs into the lungs every 4 (four) hours as needed for wheezing or shortness of breath. 1 Inhaler 3   albuterol (PROVENTIL) (2.5 MG/3ML) 0.083% nebulizer solution Take 3 mLs (2.5 mg total) by nebulization 3 (three) times daily. 75 mL 3   amLODipine (NORVASC) 5 MG tablet TAKE 1 TABLET(5 MG) BY MOUTH DAILY (Patient taking differently: Take 5 mg by mouth daily.) 30 tablet 2   aspirin EC 81 MG tablet Take 81 mg by mouth daily.     Aspirin-Salicylamide-Caffeine (BC HEADACHE POWDER PO) Take 2 packets by mouth  every evening.     budesonide-formoterol (SYMBICORT) 160-4.5 MCG/ACT inhaler Inhale 2 puffs into the lungs 2 (two) times daily. 10.2 g 0   buPROPion (WELLBUTRIN XL) 300 MG 24 hr tablet Take 1 tablet (300 mg total) by mouth every morning. 30 tablet 3   ciprofloxacin (CILOXAN) 0.3 % ophthalmic solution Place 1 drop into both eyes in the morning and at bedtime.      diclofenac Sodium (VOLTAREN) 1 % GEL Apply 2 g topically 4 (four) times daily. 150 g 0   doxycycline (VIBRA-TABS) 100 MG tablet Take 1 tablet (100 mg total) by mouth 2 (two) times daily. 20 tablet 0   erythromycin ophthalmic ointment Place 1 application into both eyes 3 (three) times daily.      furosemide (  LASIX) 20 MG tablet Take 20 mg by mouth at bedtime.      gabapentin (NEURONTIN) 300 MG capsule TAKE 1 CAPSULE(300 MG) BY MOUTH TWICE DAILY (Patient taking differently: Take 300 mg by mouth daily.) 180 capsule 0   gentamicin ointment (GARAMYCIN) 0.1 % Apply 1 Application topically daily. 30 g 0   glucose blood (ONETOUCH VERIO) test strip 1 each by Other route 2 (two) times daily. And lancets 2/day 100 each 12   HYDROcodone-acetaminophen (NORCO/VICODIN) 5-325 MG tablet Take 1 tablet by mouth every 6 (six) hours as needed for moderate pain. 30 tablet 0   Hyprom-Naphaz-Polysorb-Zn Sulf (CLEAR EYES COMPLETE OP) Place 1 drop into both eyes in the morning, at noon, and at bedtime.      Insulin Glargine (BASAGLAR KWIKPEN) 100 UNIT/ML INJECT 120 UNITS INTO THE SKIN DAILY 30 mL 0   insulin lispro (HUMALOG) 100 UNIT/ML KwikPen Inject 0.5 mLs (50 Units total) into the skin daily with supper. And pen needles 3/day (Patient taking differently: Inject 15 Units into the skin daily with supper.) 10 pen 11   ipratropium-albuterol (DUONEB) 0.5-2.5 (3) MG/3ML SOLN Take 3 mLs by nebulization every 6 (six) hours as needed. 360 mL 2   ketoconazole (NIZORAL) 2 % cream Apply 1 application topically daily. 120 g 3   losartan-hydrochlorothiazide (HYZAAR) 50-12.5  MG tablet Take 1 tablet by mouth daily. 30 tablet 11   mirtazapine (REMERON) 15 MG tablet Take 15 mg by mouth at bedtime.      nystatin (NYSTATIN) powder 1 application daily as needed (rash/sweating under breasts).      omeprazole (PRILOSEC) 40 MG capsule TAKE 1 CAPSULE(40 MG) BY MOUTH DAILY (Patient taking differently: Take 40 mg by mouth daily.) 90 capsule 0   ondansetron (ZOFRAN) 4 MG tablet TAKE 1 TABLET(4 MG) BY MOUTH EVERY 8 HOURS AS NEEDED FOR NAUSEA OR VOMITING 20 tablet 5   PAZEO 0.7 % SOLN Place 1 drop into both eyes daily.      polyethylene glycol (MIRALAX / GLYCOLAX) packet Take 17 g by mouth 2 (two) times daily. (Patient taking differently: Take 17 g by mouth daily as needed.) 60 each 1   silver sulfADIAZINE (SILVADENE) 1 % cream Apply 1 application topically daily. 50 g 0   triamcinolone cream (KENALOG) 0.1 % Apply 1 application topically 2 (two) times a day. Apply to rash on face     atorvastatin (LIPITOR) 40 MG tablet Take 1 tablet (40 mg total) by mouth daily. 30 tablet 11   levETIRAcetam (KEPPRA) 500 MG tablet Take 1 tablet (500 mg total) by mouth 2 (two) times daily. 60 tablet 0   No current facility-administered medications for this visit.    REVIEW OF SYSTEMS:  _0  denotes positive finding, _1  denotes negative finding Cardiac  Comments:  Chest pain or chest pressure: x   Shortness of breath upon exertion: x   Short of breath when lying flat:    Irregular heart rhythm:        Vascular    Pain in calf, thigh, or hip brought on by ambulation: x   Pain in feet at night that wakes you up from your sleep:  x   Blood clot in your veins:    Leg swelling:  x       Pulmonary    Oxygen at home:    Productive cough:     Wheezing:         Neurologic    Sudden weakness in arms or legs:  x   Sudden numbness in arms or legs:     Sudden onset of difficulty speaking or slurred speech:    Temporary loss of vision in one eye:     Problems with dizziness:          Gastrointestinal    Blood in stool:     Vomited blood:         Genitourinary    Burning when urinating:     Blood in urine:        Psychiatric    Major depression:         Hematologic    Bleeding problems:    Problems with blood clotting too easily:        Skin    Rashes or ulcers: x       Constitutional    Fever or chills:    -  PHYSICAL EXAM:   Vitals:   05/13/22 1015  BP: (!) 146/62  Pulse: 65  Resp: 20  Temp: 97.7 F (36.5 C)  SpO2: 92%  Weight: (!) 330 lb (149.7 kg)  Height: _0  (1.676 m)   Body mass index is 53.26 kg/m.  GENERAL: The patient is a well-nourished female, in no acute distress. The vital signs are documented above. CARDIAC: There is a regular rate and rhythm.  VASCULAR: I do not detect carotid bruits. On the right side she has a palpable femoral pulse and popliteal pulse.  She has a monophasic dorsalis pedis signal.  She has a brisk but monophasic posterior tibial signal. On the left side, which is the side with an ulcer, she has a palpable femoral pulse.  I cannot palpate a popliteal or pedal pulse.  She does have a biphasic posterior tibial signal with the Doppler and a monophasic dorsalis pedis signal. She has some bilateral lower extremity swelling and also some hyperpigmentation to suggest underlying chronic venous insufficiency. PULMONARY: There is good air exchange bilaterally without wheezing or rales. ABDOMEN: Soft and non-tender with normal pitched bowel sounds.  MUSCULOSKELETAL: There are no major deformities. NEUROLOGIC: No focal weakness or paresthesias are detected. SKIN: She has a wound on the lateral aspect of her left foot which measures about 1-1/2 cm in width by 2 cm in length.     PSYCHIATRIC: The patient has a normal affect.  DATA:    ARTERIAL DOPPLER STUDY: I have independently interpreted the arterial Doppler study that was done on 05/03/2022.  On the right side there is a brisk but monophasic dorsalis pedis and  posterior tibial signal with the Doppler.  ABIs 59%.  Toe pressures 111 mmHg.  On the left side, which is the symptomatic side, there is a biphasic posterior tibial signal with a monophasic dorsalis pedis signal.  ABIs 75%.  Toe pressures 118 mmHg.  LABS: I am unable to locate any recent labs.   [name] [facility]

## 2022-05-14 ENCOUNTER — Ambulatory Visit (HOSPITAL_COMMUNITY)
Admission: RE | Admit: 2022-05-14 | Discharge: 2022-05-14 | Disposition: A | Discharge status: Home / Self Care | Payer: Medicare PPO | Attending: Vascular Surgery | Admitting: Vascular Surgery

## 2022-05-14 ENCOUNTER — Encounter (HOSPITAL_COMMUNITY): Payer: Self-pay | Admitting: Vascular Surgery

## 2022-05-14 ENCOUNTER — Encounter (HOSPITAL_COMMUNITY)
Admission: RE | Disposition: A | Discharge status: Home / Self Care | Payer: Self-pay | Source: Home / Self Care | Attending: Vascular Surgery

## 2022-05-14 ENCOUNTER — Other Ambulatory Visit: Payer: Self-pay

## 2022-05-14 DIAGNOSIS — Z7982 Long term (current) use of aspirin: Secondary | ICD-10-CM | POA: Insufficient documentation

## 2022-05-14 DIAGNOSIS — E11621 Type 2 diabetes mellitus with foot ulcer: Secondary | ICD-10-CM | POA: Insufficient documentation

## 2022-05-14 DIAGNOSIS — I5032 Chronic diastolic (congestive) heart failure: Secondary | ICD-10-CM | POA: Diagnosis not present

## 2022-05-14 DIAGNOSIS — F17210 Nicotine dependence, cigarettes, uncomplicated: Secondary | ICD-10-CM | POA: Diagnosis not present

## 2022-05-14 DIAGNOSIS — K7460 Unspecified cirrhosis of liver: Secondary | ICD-10-CM | POA: Diagnosis not present

## 2022-05-14 DIAGNOSIS — E7800 Pure hypercholesterolemia, unspecified: Secondary | ICD-10-CM | POA: Insufficient documentation

## 2022-05-14 DIAGNOSIS — Z794 Long term (current) use of insulin: Secondary | ICD-10-CM | POA: Insufficient documentation

## 2022-05-14 DIAGNOSIS — Z79899 Other long term (current) drug therapy: Secondary | ICD-10-CM | POA: Insufficient documentation

## 2022-05-14 DIAGNOSIS — I70299 Other atherosclerosis of native arteries of extremities, unspecified extremity: Secondary | ICD-10-CM

## 2022-05-14 DIAGNOSIS — J449 Chronic obstructive pulmonary disease, unspecified: Secondary | ICD-10-CM | POA: Insufficient documentation

## 2022-05-14 DIAGNOSIS — L97529 Non-pressure chronic ulcer of other part of left foot with unspecified severity: Secondary | ICD-10-CM | POA: Diagnosis not present

## 2022-05-14 DIAGNOSIS — I252 Old myocardial infarction: Secondary | ICD-10-CM | POA: Insufficient documentation

## 2022-05-14 DIAGNOSIS — E1151 Type 2 diabetes mellitus with diabetic peripheral angiopathy without gangrene: Secondary | ICD-10-CM | POA: Insufficient documentation

## 2022-05-14 DIAGNOSIS — I70245 Atherosclerosis of native arteries of left leg with ulceration of other part of foot: Secondary | ICD-10-CM | POA: Diagnosis not present

## 2022-05-14 DIAGNOSIS — N186 End stage renal disease: Secondary | ICD-10-CM | POA: Diagnosis not present

## 2022-05-14 DIAGNOSIS — E1122 Type 2 diabetes mellitus with diabetic chronic kidney disease: Secondary | ICD-10-CM | POA: Diagnosis not present

## 2022-05-14 DIAGNOSIS — I130 Hypertensive heart and chronic kidney disease with heart failure and stage 1 through stage 4 chronic kidney disease, or unspecified chronic kidney disease: Secondary | ICD-10-CM | POA: Diagnosis not present

## 2022-05-14 HISTORY — PX: PERIPHERAL VASCULAR BALLOON ANGIOPLASTY: CATH118281

## 2022-05-14 HISTORY — PX: ABDOMINAL AORTOGRAM W/LOWER EXTREMITY: CATH118223

## 2022-05-14 LAB — POCT I-STAT, CHEM 8
BUN: 28 mg/dL — ABNORMAL HIGH (ref 8–23)
Calcium, Ion: 1.2 mmol/L (ref 1.15–1.40)
Chloride: 95 mmol/L — ABNORMAL LOW (ref 98–111)
Creatinine, Ser: 1.1 mg/dL — ABNORMAL HIGH (ref 0.44–1.00)
Glucose, Bld: 183 mg/dL — ABNORMAL HIGH (ref 70–99)
HCT: 41 % (ref 36.0–46.0)
Hemoglobin: 13.9 g/dL (ref 12.0–15.0)
Potassium: 3.8 mmol/L (ref 3.5–5.1)
Sodium: 138 mmol/L (ref 135–145)
TCO2: 31 mmol/L (ref 22–32)

## 2022-05-14 LAB — GLUCOSE, CAPILLARY
Glucose-Capillary: 218 mg/dL — ABNORMAL HIGH (ref 70–99)
Glucose-Capillary: 141 mg/dL — ABNORMAL HIGH (ref 70–99)

## 2022-05-14 LAB — POCT ACTIVATED CLOTTING TIME
Activated Clotting Time: 244 seconds
Activated Clotting Time: 260 seconds

## 2022-05-14 SURGERY — ABDOMINAL AORTOGRAM W/LOWER EXTREMITY
Anesthesia: LOCAL | Laterality: Left

## 2022-05-14 MED ORDER — ONDANSETRON HCL 4 MG/2ML IJ SOLN: 4.0000 mg | Freq: Four times a day (QID) | INTRAMUSCULAR | Status: DC | PRN

## 2022-05-14 MED ORDER — IODIXANOL 320 MG/ML IV SOLN
INTRAVENOUS | Status: DC | PRN
  Administered 2022-05-14: 120 mL

## 2022-05-14 MED ORDER — HEPARIN SODIUM (PORCINE) 1000 UNIT/ML IJ SOLN
INTRAMUSCULAR | Status: AC
  Filled 2022-05-14: qty 10

## 2022-05-14 MED ORDER — CLOPIDOGREL BISULFATE 300 MG PO TABS
ORAL_TABLET | ORAL | Status: DC | PRN
  Administered 2022-05-14: 300 mg via ORAL

## 2022-05-14 MED ORDER — NITROGLYCERIN 1 MG/10 ML FOR IR/CATH LAB
INTRA_ARTERIAL | Status: AC
  Filled 2022-05-14: qty 10

## 2022-05-14 MED ORDER — HEPARIN SODIUM (PORCINE) 1000 UNIT/ML IJ SOLN
INTRAMUSCULAR | Status: DC | PRN
  Administered 2022-05-14: 2000 [IU] via INTRAVENOUS
  Administered 2022-05-14: 10000 [IU] via INTRAVENOUS

## 2022-05-14 MED ORDER — HEPARIN (PORCINE) IN NACL 1000-0.9 UT/500ML-% IV SOLN
INTRAVENOUS | Status: AC
  Filled 2022-05-14: qty 1000

## 2022-05-14 MED ORDER — FENTANYL CITRATE (PF) 100 MCG/2ML IJ SOLN
INTRAMUSCULAR | Status: DC | PRN
  Administered 2022-05-14 (×2): 50 ug via INTRAVENOUS

## 2022-05-14 MED ORDER — SODIUM CHLORIDE 0.9% FLUSH: 3.0000 mL | Freq: Two times a day (BID) | INTRAVENOUS | Status: DC

## 2022-05-14 MED ORDER — HYDRALAZINE HCL 20 MG/ML IJ SOLN: 5.0000 mg | INTRAMUSCULAR | Status: DC | PRN

## 2022-05-14 MED ORDER — MIDAZOLAM HCL 2 MG/2ML IJ SOLN
INTRAMUSCULAR | Status: DC | PRN
  Administered 2022-05-14 (×2): 1 mg via INTRAVENOUS

## 2022-05-14 MED ORDER — SODIUM CHLORIDE 0.9 % WEIGHT BASED INFUSION: 1.0000 mL/kg/h | INTRAVENOUS | Status: DC

## 2022-05-14 MED ORDER — FENTANYL CITRATE (PF) 100 MCG/2ML IJ SOLN
INTRAMUSCULAR | Status: AC
  Filled 2022-05-14: qty 2

## 2022-05-14 MED ORDER — SODIUM CHLORIDE 0.9% FLUSH: 3.0000 mL | INTRAVENOUS | Status: DC | PRN

## 2022-05-14 MED ORDER — ACETAMINOPHEN 325 MG PO TABS: 650.0000 mg | ORAL_TABLET | ORAL | Status: DC | PRN

## 2022-05-14 MED ORDER — CLOPIDOGREL BISULFATE 75 MG PO TABS: 75.0000 mg | ORAL_TABLET | Freq: Every day | ORAL | 11 refills | Status: DC

## 2022-05-14 MED ORDER — SODIUM CHLORIDE 0.9 % IV SOLN: INTRAVENOUS | Status: DC

## 2022-05-14 MED ORDER — NITROGLYCERIN 1 MG/10 ML FOR IR/CATH LAB
INTRA_ARTERIAL | Status: DC | PRN
  Administered 2022-05-14: 200 ug

## 2022-05-14 MED ORDER — LIDOCAINE HCL (PF) 1 % IJ SOLN
INTRAMUSCULAR | Status: DC | PRN
  Administered 2022-05-14: 10 mL

## 2022-05-14 MED ORDER — CLOPIDOGREL BISULFATE 300 MG PO TABS
ORAL_TABLET | ORAL | Status: AC
  Filled 2022-05-14: qty 1

## 2022-05-14 MED ORDER — HEPARIN (PORCINE) IN NACL 1000-0.9 UT/500ML-% IV SOLN
INTRAVENOUS | Status: DC | PRN
  Administered 2022-05-14 (×2): 500 mL

## 2022-05-14 MED ORDER — SODIUM CHLORIDE 0.9 % IV SOLN: 250.0000 mL | INTRAVENOUS | Status: DC | PRN

## 2022-05-14 MED ORDER — LABETALOL HCL 5 MG/ML IV SOLN: 10.0000 mg | INTRAVENOUS | Status: DC | PRN

## 2022-05-14 MED ORDER — MIDAZOLAM HCL 2 MG/2ML IJ SOLN
INTRAMUSCULAR | Status: AC
  Filled 2022-05-14: qty 2

## 2022-05-14 MED ORDER — LIDOCAINE HCL (PF) 1 % IJ SOLN
INTRAMUSCULAR | Status: AC
  Filled 2022-05-14: qty 30

## 2022-05-14 SURGICAL SUPPLY — 26 items
BALL STERLING OTW 2.5X150X150 (BALLOONS) ×2
BALLN MUSTANG 5X120X135 (BALLOONS) ×2
BALLN STERLING OTW 2.5X150X150 (BALLOONS) ×2
BALLN STERLING OTW 2X220X150 (BALLOONS) ×2
BALLOON MUSTANG 5X120X135 (BALLOONS) IMPLANT
BALLOON STERLING OTW 2X220X150 (BALLOONS) IMPLANT
BALLOON STRLNG OTW 2.5X150X150 (BALLOONS) IMPLANT
CATH ANGIO 5F PIGTAIL 65CM (CATHETERS) IMPLANT
CATH NAVICROSS ANGLED 135CM (MICROCATHETER) IMPLANT
CATH STRAIGHT 5FR 65CM (CATHETERS) IMPLANT
CATH TEMPO 5F RIM 65CM (CATHETERS) IMPLANT
CLOSURE MYNX CONTROL 6F/7F (Vascular Products) IMPLANT
KIT ENCORE 26 ADVANTAGE (KITS) IMPLANT
KIT MICROPUNCTURE NIT STIFF (SHEATH) IMPLANT
KIT PV (KITS) ×2 IMPLANT
SHEATH DESTIN RDC 6FR 45 (SHEATH) IMPLANT
SHEATH PINNACLE 5F 10CM (SHEATH) IMPLANT
SHEATH PINNACLE 6F 10CM (SHEATH) IMPLANT
SHEATH PROBE COVER 6X72 (BAG) IMPLANT
STOPCOCK MORSE 400PSI 3WAY (MISCELLANEOUS) IMPLANT
TRANSDUCER W/STOPCOCK (MISCELLANEOUS) ×2 IMPLANT
TRAY PV CATH (CUSTOM PROCEDURE TRAY) ×2 IMPLANT
TUBING CIL FLEX 10 FLL-RA (TUBING) IMPLANT
WIRE G V18X300CM (WIRE) IMPLANT
WIRE HITORQ VERSACORE ST 145CM (WIRE) IMPLANT
WIRE ROSEN-J .035X260CM (WIRE) IMPLANT

## 2022-05-14 NOTE — Interval H&P Note (Signed)
History and Physical Interval Note:  05/14/2022 7:38 AM  [name]  has presented today for surgery, with the diagnosis of PAD with ulcer.  The various methods of treatment have been discussed with the patient and family. After consideration of risks, benefits and other options for treatment, the patient has consented to  Procedure(s): ABDOMINAL AORTOGRAM W/LOWER EXTREMITY (N/A) as a surgical intervention.  The patient's history has been reviewed, patient examined, no change in status, stable for surgery.  I have reviewed the patient's chart and labs.  Questions were answered to the patient's satisfaction.     [name]

## 2022-05-14 NOTE — Op Note (Signed)
PATIENT: [name]      MRN: [id number] DOB: 08/30/1940    DATE OF PROCEDURE: 05/14/2022  INDICATIONS:    [name] is a 82 y.o. female who presented with a nonhealing wound on the left foot and evidence of infrainguinal arterial occlusive disease.  She presents for arteriography.  PROCEDURE:    Conscious sedation Ultrasound-guided access to the right common femoral artery Aortogram with bilateral iliac arteriogram Selective catheterization of the left external iliac artery with left lower extremity runoff (second-order catheterization) Selective catheterization of the superficial femoral artery (third order catheterization) Angioplasty of the posterior tibial artery with a 2 mm x 220 mm Sterling and 2.5 x 150 Sterling. Angioplasty of the left superficial femoral artery with a 5 x 120 Mustang balloon. Mynx closure of the right common femoral artery  SURGEON: Judeth Cornfield. Scot Dock, MD, FACS  ANESTHESIA: Local with sedation  EBL: Minimal  TECHNIQUE: The patient was brought to the peripheral vascular lab and was sedated. The period of conscious sedation was 122 minutes.  During that time period, I was present face-to-face 100% of the time.  The patient was administered 1 mg of Versed and 50 mcg of fentanyl. The patient's heart rate, blood pressure, and oxygen saturation were monitored by the nurse continuously during the procedure.  Both groins were prepped and draped in the usual sterile fashion.  Under ultrasound guidance, after the skin was anesthetized, I cannulated the right common femoral artery with a micropuncture needle and a micropuncture sheath was introduced over a wire.  This was exchanged for a 5 [place] sheath over a Bentson wire.  By ultrasound the femoral artery was patent. A real-time image was obtained and sent to the server.  The pigtail catheter was positioned at the L1 vertebral body and flush aortogram obtained.  I then exchanged this for a  rim catheter which was positioned into the left common iliac artery.  The wire was advanced into the external iliac artery and this catheter exchanged for a straight catheter.  Selective left external iliac arteriogram was obtained with left lower extremity runoff.  I elected to address the tibial disease.  I therefore advanced the wire into the superficial femoral artery and exchanged the short 5 French sheath for a 45 cm 6 French destination sheath which was positioned over a Rosen wire into the superficial femoral artery.  I was able to get a V18 wire using a Ferd Hibbs cross catheter for support through the posterior tibial artery and confirmed positioning distally.  I selected a 2 x 220 Sterling balloon which was positioned across the posterior tibial artery and multiple inflations were made.  Proximally there was some mild dissection and I went back with a 2.5 x 150 Sterling balloon which was inflated to nominal pressure for 2 minutes.  Completion films showed excellent result.  Next I elected to address the tandem 60% stenoses in the superficial femoral artery.  I selected a 5 mm x 120 Mustang balloon which was inflated to nominal pressure and follow-up films showed a good result.  At the completion of the procedure the long sheath was exchanged for a short 7 French sheath in the right groin.  The right groin was then closed with a minx device.  There was good hemostasis noted.  The patient tolerated the procedure well was transferred to the holding area in stable condition.  FINDINGS:   Single renal arteries bilaterally with no significant renal artery stenosis Infrarenal aorta, bilateral common iliac arteries,  bilateral external iliac arteries, and bilateral hypogastric arteries are patent. On the right side the common femoral, and deep femoral arteries are patent.  The superficial femoral artery is patent although there were 2 tandem stenoses in the mid SFA.  Both were about 60% and narrowing.  These  were addressed with balloon angioplasty as described above.  In addition there was severe tibial disease.  The anterior tibial artery was occluded.  The posterior tibial artery had severe diffuse disease but had a more normal-appearing segment at the ankle.  I was able to get through this disease and address this with balloon angioplasty as described above.  The peroneal artery is patent.  There is some mild proximal disease noted.    CLINICAL NOTE: Her circulation has been optimized.  She will continue to follow-up with podiatry concerning her left foot wound.  I have arranged follow-up of her PAD.  Deitra Mayo, MD, FACS [facility]  DATE OF DICTATION:   05/14/2022

## 2022-05-14 NOTE — Discharge Instructions (Signed)
Femoral Site Care This sheet gives you information about how to care for yourself after your procedure. Your health care provider may also give you more specific instructions. If you have problems or questions, contact your health care provider. What can I expect after the procedure?  After the procedure, it is common to have: Bruising that usually fades within 1-2 weeks. Tenderness at the site. Follow these instructions at home: Wound care Follow instructions from your health care provider about how to take care of your insertion site. Make sure you: Wash your hands with soap and water before you change your bandage (dressing). If soap and water are not available, use hand sanitizer. Remove your dressing as told by your health care provider. 24 hours Do not take baths, swim, or use a hot tub until your health care provider approves. You may shower 24-48 hours after the procedure or as told by your health care provider. Gently wash the site with plain soap and water. Pat the area dry with a clean towel. Do not rub the site. This may cause bleeding. Do not apply powder or lotion to the site. Keep the site clean and dry. Check your femoral site every day for signs of infection. Check for: Redness, swelling, or pain. Fluid or blood. Warmth. Pus or a bad smell. Activity For the first 2-3 days after your procedure, or as long as directed: Avoid climbing stairs as much as possible. Do not squat. Do not lift anything that is heavier than 10 lb (4.5 kg), or the limit that you are told, until your health care provider says that it is safe. For 5 days Rest as directed. Avoid sitting for a long time without moving. Get up to take short walks every 1-2 hours. Do not drive for 24 hours if you were given a medicine to help you relax (sedative). General instructions Take over-the-counter and prescription medicines only as told by your health care provider. Keep all follow-up visits as told by your  health care provider. This is important. Contact a health care provider if you have: A fever or chills. You have redness, swelling, or pain around your insertion site. Get help right away if: The catheter insertion area swells very fast. You pass out. You suddenly start to sweat or your skin gets clammy. The catheter insertion area is bleeding, and the bleeding does not stop when you hold steady pressure on the area. The area near or just beyond the catheter insertion site becomes pale, cool, tingly, or numb. These symptoms may represent a serious problem that is an emergency. Do not wait to see if the symptoms will go away. Get medical help right away. Call your local emergency services (911 in the [place] Do not drive yourself to the hospital. Summary After the procedure, it is common to have bruising that usually fades within 1-2 weeks. Check your femoral site every day for signs of infection. Do not lift anything that is heavier than 10 lb (4.5 kg), or the limit that you are told, until your health care provider says that it is safe. This information is not intended to replace advice given to you by your health care provider. Make sure you discuss any questions you have with your health care provider. Document Revised: 04/18/2017 Document Reviewed: 04/18/2017 Elsevier Patient Education  2020 [employer] Inc[employer]  

## 2022-05-14 NOTE — Progress Notes (Signed)
Up and tolerated well; right groin stable, no bleeding or hematoma

## 2022-05-15 NOTE — Progress Notes (Signed)
  Subjective:  Patient ID: [name], female    DOB: 06-10-1940,  MRN: [id number]  Chief Complaint  Patient presents with   Diabetic Ulcer    3 week wound care    82 y.o. female presents with the above complaint. History confirmed with patient. She is having a vascular procedure done soon  Objective:  Physical Exam: warm, good capillary refill and nonpalpable pulses in both feet, she has mild to moderate edema.  Ulceration full-thickness on the lateral dorsal fifth metatarsal head and medial arch fibrous wound bed no purulent drainage no cellulitis no malodor.       ABI 0.75 on left, 0.59 on right  Radiographs: Multiple views x-ray of the left foot: no fracture, dislocation, swelling or degenerative changes noted and no soft tissue emphysema, no signs of osteomyelitis Assessment:   1. 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  Ulcer left foot -We discussed the etiology and factors that are a part of the wound healing process.  We also discussed the risk of infection both soft tissue and osteomyelitis from open ulceration.  Discussed the risk of limb loss if this happens or worsens. -We discussed the impact of smoking on wound healing and I recommended cessation of this -Dressed with Iodosorb, DSD. -Continue home dressing changes daily with 4 x 4 gauze and gentamicin ointment, Rx sent to pharmacy, use reviewed -Hopefully shows good signs of healing once she undergoes angiography this week -Last antibiotics: Do not see indication for oral antibiotics at this point there are no systemic or local signs of infection -Imaging: x-ray reviewed, shows no signs of erosions, osteolysis, osteomyelitis or emphysema.    No follow-ups on file.

## 2022-05-20 ENCOUNTER — Encounter (INDEPENDENT_AMBULATORY_CARE_PROVIDER_SITE_OTHER): Payer: Medicare PPO | Admitting: Ophthalmology

## 2022-05-20 DIAGNOSIS — E113413 Type 2 diabetes mellitus with severe nonproliferative diabetic retinopathy with macular edema, bilateral: Secondary | ICD-10-CM

## 2022-05-20 DIAGNOSIS — H35033 Hypertensive retinopathy, bilateral: Secondary | ICD-10-CM | POA: Diagnosis not present

## 2022-05-20 DIAGNOSIS — I10 Essential (primary) hypertension: Secondary | ICD-10-CM

## 2022-05-20 DIAGNOSIS — H43813 Vitreous degeneration, bilateral: Secondary | ICD-10-CM

## 2022-06-07 ENCOUNTER — Encounter: Payer: Self-pay | Admitting: *Deleted

## 2022-06-07 ENCOUNTER — Telehealth: Payer: Self-pay | Admitting: *Deleted

## 2022-06-07 ENCOUNTER — Other Ambulatory Visit: Payer: Self-pay | Admitting: *Deleted

## 2022-06-07 DIAGNOSIS — I5032 Chronic diastolic (congestive) heart failure: Secondary | ICD-10-CM

## 2022-06-07 DIAGNOSIS — L97909 Non-pressure chronic ulcer of unspecified part of unspecified lower leg with unspecified severity: Secondary | ICD-10-CM

## 2022-06-07 NOTE — Patient Outreach (Signed)
  Care Coordination   Initial Visit Note   06/07/2022 Name: [name] MRN: [id number] DOB: [birth year]  [name] is a 82 y.o. year old female who sees Tamala Julian, Malva Limes., FNP for primary care. I  Spoke with pt's son, Bryson Corona, by phone today.  What matters to the patients health and wellness today?  Agreeable to participating in the Care Coordination program- interested in getting more help for patient, meals on wheels and other community support.     Goals Addressed             This Visit's Progress    Assess for additional community resources and support to aide in pt care/well-being       Activities and task to complete in order to accomplish goals.    Call Veteran's Program once receive info in mail to follow up on eligibility/application for "aide and attendance" in-home aide Review CAPS info and consider applying for this as well Pursue housing options for lower monthly rate for patient ([place] to also reach out to you) I have called [employer] to determine status of her previously received Meals on Wheels [phone])- they will update me and I will then update yall          SDOH assessments and interventions completed:  Yes  SDOH Interventions Today    Flowsheet Row Most Recent Value  SDOH Interventions   Food Insecurity Interventions Other (Comment)  [was getting Meals on Wheels but not in last few months- will inquire]  Housing Interventions Intervention Not Indicated  Transportation Interventions Intervention Not Indicated  Utilities Interventions Intervention Not Indicated  Alcohol Usage Interventions Intervention Not Indicated (Score <7)  Physical Activity Interventions Intervention Not Indicated        Care Coordination Interventions:  Yes, provided    Follow up plan: Follow up call scheduled for 06/29/22    Encounter Outcome:  Pt. Visit Completed

## 2022-06-07 NOTE — Patient Instructions (Signed)
Visit Information  Thank you for taking time to visit with me today. Please don't hesitate to contact me if I can be of assistance to you.   Following are the goals we discussed today:   Goals Addressed             This Visit's Progress    Assess for additional community resources and support to aide in pt care/well-being       Activities and task to complete in order to accomplish goals.    Call Veteran's Program once receive info in mail to follow up on eligibility/application for "aide and attendance" in-home aide Review CAPS info and consider applying for this as well Pursue housing options for lower monthly rate for patient ([place] to also reach out to you) I have called [employer] to determine status of her previously received Meals on Wheels [phone])- they will update me and I will then update yall          Our next appointment is by telephone on 06/29/22   Please call the care guide team at [phone] if you need to cancel or reschedule your appointment.   If you are experiencing a Mental Health or [place] or need someone to talk to, please call the Suicide and Crisis Lifeline: 988 call 911   The patient verbalized understanding of instructions, educational materials, and care plan provided today and DECLINED offer to receive copy of patient instructions, educational materials, and care plan.   Telephone follow up appointment with care management team member scheduled for: 06/29/22  Eduard Clos, MSW, [place] Worker [employer] [phone]

## 2022-06-08 ENCOUNTER — Telehealth: Payer: Self-pay | Admitting: *Deleted

## 2022-06-08 NOTE — Telephone Encounter (Signed)
   Telephone encounter was:  Successful.  06/08/2022 Name: [name] MRN: [id number] DOB: [birth year]  [name] is a 82 y.o. year old female who is a primary care patient of Tamala Julian Malva Limes., FNP . The community resource team was consulted for assistance with Food Insecurity and housing   Care guide performed the following interventions: Patient provided with information about care guide support team and interviewed to confirm resource needs. Mailed patient food stamp application and housing lists  Follow Up Plan:  No further follow up planned at this time. The patient has been provided with needed resources.  [facility] [phone] [address] , [place] [address] Email : [email or web address]

## 2022-06-17 ENCOUNTER — Encounter (INDEPENDENT_AMBULATORY_CARE_PROVIDER_SITE_OTHER): Payer: Medicare PPO | Admitting: Ophthalmology

## 2022-06-17 DIAGNOSIS — I10 Essential (primary) hypertension: Secondary | ICD-10-CM

## 2022-06-17 DIAGNOSIS — H43813 Vitreous degeneration, bilateral: Secondary | ICD-10-CM | POA: Diagnosis not present

## 2022-06-17 DIAGNOSIS — E113413 Type 2 diabetes mellitus with severe nonproliferative diabetic retinopathy with macular edema, bilateral: Secondary | ICD-10-CM | POA: Diagnosis not present

## 2022-06-17 DIAGNOSIS — H35033 Hypertensive retinopathy, bilateral: Secondary | ICD-10-CM

## 2022-06-24 ENCOUNTER — Ambulatory Visit (HOSPITAL_COMMUNITY): Payer: Medicare PPO

## 2022-06-29 ENCOUNTER — Ambulatory Visit: Payer: Self-pay | Admitting: *Deleted

## 2022-06-29 NOTE — Patient Instructions (Signed)
Visit Information  Thank you for taking time to visit with me today. Please don't hesitate to contact me if I can be of assistance to you.   Following are the goals we discussed today:   Goals Addressed             This Visit's Progress    Assess for additional community resources and support to aide in pt care/well-being       Activities and task to complete in order to accomplish goals.    Call Veteran's Program once receive info in mail to follow up on eligibility/application for "aide and attendance" in-home aide Review CAPS info and consider applying for this as well Pursue housing options for lower monthly rate for patient ([place] to also reach out to you) call [employer] to determine status of her previously received Meals on Wheels [phone])- they will update me and I will then update yall          Our next appointment is by telephone on 07/16/22  Please call the care guide team at [phone] if you need to cancel or reschedule your appointment.   If you are experiencing a Mental Health or [place] or need someone to talk to, please call the Suicide and Crisis Lifeline: 988 call 911   The patient verbalized understanding of instructions, educational materials, and care plan provided today and DECLINED offer to receive copy of patient instructions, educational materials, and care plan.   Telephone follow up appointment with care management team member scheduled for:07/16/22  Eduard Clos, MSW, [place] Worker [employer] [phone]

## 2022-06-29 NOTE — Patient Outreach (Signed)
  Care Coordination   Follow Up Visit Note   06/29/2022 Name: [name] MRN: [id number] DOB: 03-09-1941  [name] is a 82 y.o. year old female who sees Tamala Julian, Malva Limes., FNP for primary care. I  spoke with daughter, [name].  What matters to the patients health and wellness today?  Has not gotten the resources mailed for CAPS and Veteran's Aid program.    Goals Addressed             This Visit's Progress    Assess for additional community resources and support to aide in pt care/well-being       Activities and task to complete in order to accomplish goals.    Call Veteran's Program once receive info in mail to follow up on eligibility/application for "aide and attendance" in-home aide Review CAPS info and consider applying for this as well Pursue housing options for lower monthly rate for patient ([place] to also reach out to you) call [employer] to determine status of her previously received Meals on Wheels [phone])- they will update me and I will then update yall          SDOH assessments and interventions completed:  Yes     Care Coordination Interventions:  Yes, provided  Interventions Today    Flowsheet Row Most Recent Value  General Interventions   General Interventions Discussed/Reviewed Counselling psychologist to daughter's address]       Follow up plan: Follow up call scheduled for 07/16/22    Encounter Outcome:  Pt. Visit Completed

## 2022-07-01 ENCOUNTER — Ambulatory Visit (INDEPENDENT_AMBULATORY_CARE_PROVIDER_SITE_OTHER): Payer: Medicare PPO | Admitting: Physician Assistant

## 2022-07-01 ENCOUNTER — Ambulatory Visit (HOSPITAL_COMMUNITY)
Admission: RE | Admit: 2022-07-01 | Discharge: 2022-07-01 | Disposition: A | Discharge status: Home / Self Care | Payer: Medicare PPO | Source: Ambulatory Visit | Attending: Physician Assistant | Admitting: Physician Assistant

## 2022-07-01 VITALS — BP 157/65 | HR 57 | Temp 98.2°F | Resp 20 | Ht 66.0 in | Wt 296.0 lb

## 2022-07-01 DIAGNOSIS — F17200 Nicotine dependence, unspecified, uncomplicated: Secondary | ICD-10-CM

## 2022-07-01 DIAGNOSIS — I70299 Other atherosclerosis of native arteries of extremities, unspecified extremity: Secondary | ICD-10-CM | POA: Insufficient documentation

## 2022-07-01 DIAGNOSIS — L97909 Non-pressure chronic ulcer of unspecified part of unspecified lower leg with unspecified severity: Secondary | ICD-10-CM | POA: Diagnosis not present
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
## 2022-07-02 LAB — VAS US ABI WITH/WO TBI
Left ABI: 0.86
Right ABI: 0.74

## 2022-07-05 ENCOUNTER — Other Ambulatory Visit: Payer: Self-pay

## 2022-07-05 DIAGNOSIS — I70299 Other atherosclerosis of native arteries of extremities, unspecified extremity: Secondary | ICD-10-CM

## 2022-07-16 ENCOUNTER — Encounter: Payer: Self-pay | Admitting: *Deleted

## 2022-07-16 ENCOUNTER — Encounter (INDEPENDENT_AMBULATORY_CARE_PROVIDER_SITE_OTHER): Payer: Medicare PPO | Admitting: Ophthalmology

## 2022-07-19 ENCOUNTER — Encounter (INDEPENDENT_AMBULATORY_CARE_PROVIDER_SITE_OTHER): Payer: Medicare PPO | Admitting: Ophthalmology

## 2022-07-19 ENCOUNTER — Telehealth: Payer: Self-pay | Admitting: *Deleted

## 2022-07-19 DIAGNOSIS — H43813 Vitreous degeneration, bilateral: Secondary | ICD-10-CM

## 2022-07-19 DIAGNOSIS — H35033 Hypertensive retinopathy, bilateral: Secondary | ICD-10-CM | POA: Diagnosis not present

## 2022-07-19 DIAGNOSIS — I10 Essential (primary) hypertension: Secondary | ICD-10-CM | POA: Diagnosis not present

## 2022-07-19 DIAGNOSIS — E113413 Type 2 diabetes mellitus with severe nonproliferative diabetic retinopathy with macular edema, bilateral: Secondary | ICD-10-CM | POA: Diagnosis not present

## 2022-07-19 NOTE — Progress Notes (Unsigned)
  Care Coordination Note  07/19/2022 Name: ELPHA RYON MRN: [id number] DOB: 1940-09-14  HAYSLEE GENSCH is a 82 y.o. year old female who is a primary care patient of Sonia Side., FNP and is actively engaged with the care management team. I reached out to [name] by phone today to assist with re-scheduling a follow up visit with the Licensed Clinical Social Worker  Follow up plan: Unsuccessful telephone outreach attempt made. A HIPAA compliant phone message was left for the patient providing contact information and requesting a return call.   [place]  Direct Dial: [phone]

## 2022-07-20 ENCOUNTER — Ambulatory Visit: Payer: Medicare PPO | Admitting: Podiatry

## 2022-07-22 NOTE — Progress Notes (Signed)
  Care Coordination Note  07/22/2022 Name: ANJOLAOLUWA STJULIAN MRN: [id number] DOB: [birth year]  [name] is a 82 y.o. year old female who is a primary care patient of Sonia Side., FNP and is actively engaged with the care management team. I reached out to [name] by phone today to assist with re-scheduling a follow up visit with the Licensed Clinical Social Worker  Follow up plan: Unsuccessful telephone outreach attempt made. A HIPAA compliant phone message was left for the patient providing contact information and requesting a return call. We have been unable to make contact with the patient for follow up. The care management team is available to follow up with the patient after provider conversation with the patient regarding recommendation for care management engagement and subsequent re-referral to the care management team.   [place]  Direct Dial: [phone]

## 2022-08-04 DIAGNOSIS — L97909 Non-pressure chronic ulcer of unspecified part of unspecified lower leg with unspecified severity: Secondary | ICD-10-CM | POA: Diagnosis not present

## 2022-08-04 DIAGNOSIS — Z79899 Other long term (current) drug therapy: Secondary | ICD-10-CM | POA: Diagnosis not present

## 2022-08-04 DIAGNOSIS — I70203 Unspecified atherosclerosis of native arteries of extremities, bilateral legs: Secondary | ICD-10-CM | POA: Diagnosis not present

## 2022-08-04 DIAGNOSIS — I130 Hypertensive heart and chronic kidney disease with heart failure and stage 1 through stage 4 chronic kidney disease, or unspecified chronic kidney disease: Secondary | ICD-10-CM | POA: Diagnosis not present

## 2022-08-04 DIAGNOSIS — N1831 Chronic kidney disease, stage 3a: Secondary | ICD-10-CM | POA: Diagnosis not present

## 2022-08-04 DIAGNOSIS — I70299 Other atherosclerosis of native arteries of extremities, unspecified extremity: Secondary | ICD-10-CM | POA: Diagnosis not present

## 2022-08-04 DIAGNOSIS — E1142 Type 2 diabetes mellitus with diabetic polyneuropathy: Secondary | ICD-10-CM | POA: Diagnosis not present

## 2022-08-04 DIAGNOSIS — I5032 Chronic diastolic (congestive) heart failure: Secondary | ICD-10-CM | POA: Diagnosis not present

## 2022-08-04 DIAGNOSIS — Z136 Encounter for screening for cardiovascular disorders: Secondary | ICD-10-CM | POA: Diagnosis not present

## 2022-08-23 ENCOUNTER — Encounter (INDEPENDENT_AMBULATORY_CARE_PROVIDER_SITE_OTHER): Payer: Medicare PPO | Admitting: Ophthalmology

## 2022-08-23 DIAGNOSIS — E113413 Type 2 diabetes mellitus with severe nonproliferative diabetic retinopathy with macular edema, bilateral: Secondary | ICD-10-CM | POA: Diagnosis not present

## 2022-08-23 DIAGNOSIS — I10 Essential (primary) hypertension: Secondary | ICD-10-CM | POA: Diagnosis not present

## 2022-08-23 DIAGNOSIS — H43813 Vitreous degeneration, bilateral: Secondary | ICD-10-CM | POA: Diagnosis not present

## 2022-08-23 DIAGNOSIS — H35033 Hypertensive retinopathy, bilateral: Secondary | ICD-10-CM | POA: Diagnosis not present

## 2022-09-12 ENCOUNTER — Emergency Department (HOSPITAL_COMMUNITY)
Admission: EM | Admit: 2022-09-12 | Discharge: 2022-09-12 | Disposition: A | Discharge status: Home / Self Care | Payer: Medicare PPO | Attending: Emergency Medicine | Admitting: Emergency Medicine

## 2022-09-12 ENCOUNTER — Emergency Department (HOSPITAL_COMMUNITY): Payer: Medicare PPO

## 2022-09-12 ENCOUNTER — Other Ambulatory Visit: Payer: Self-pay

## 2022-09-12 ENCOUNTER — Encounter (HOSPITAL_COMMUNITY): Payer: Self-pay

## 2022-09-12 DIAGNOSIS — M25511 Pain in right shoulder: Secondary | ICD-10-CM | POA: Diagnosis not present

## 2022-09-12 DIAGNOSIS — I509 Heart failure, unspecified: Secondary | ICD-10-CM | POA: Insufficient documentation

## 2022-09-12 DIAGNOSIS — M545 Low back pain, unspecified: Secondary | ICD-10-CM | POA: Insufficient documentation

## 2022-09-12 DIAGNOSIS — W1839XA Other fall on same level, initial encounter: Secondary | ICD-10-CM | POA: Insufficient documentation

## 2022-09-12 DIAGNOSIS — R739 Hyperglycemia, unspecified: Secondary | ICD-10-CM | POA: Diagnosis not present

## 2022-09-12 DIAGNOSIS — R7309 Other abnormal glucose: Secondary | ICD-10-CM | POA: Insufficient documentation

## 2022-09-12 DIAGNOSIS — R202 Paresthesia of skin: Secondary | ICD-10-CM | POA: Diagnosis not present

## 2022-09-12 DIAGNOSIS — D649 Anemia, unspecified: Secondary | ICD-10-CM | POA: Diagnosis not present

## 2022-09-12 DIAGNOSIS — I110 Hypertensive heart disease with heart failure: Secondary | ICD-10-CM | POA: Insufficient documentation

## 2022-09-12 DIAGNOSIS — Z7982 Long term (current) use of aspirin: Secondary | ICD-10-CM | POA: Insufficient documentation

## 2022-09-12 DIAGNOSIS — R531 Weakness: Secondary | ICD-10-CM | POA: Diagnosis not present

## 2022-09-12 DIAGNOSIS — M25512 Pain in left shoulder: Secondary | ICD-10-CM | POA: Insufficient documentation

## 2022-09-12 DIAGNOSIS — R911 Solitary pulmonary nodule: Secondary | ICD-10-CM | POA: Diagnosis not present

## 2022-09-12 DIAGNOSIS — Z7902 Long term (current) use of antithrombotics/antiplatelets: Secondary | ICD-10-CM | POA: Insufficient documentation

## 2022-09-12 DIAGNOSIS — Z79899 Other long term (current) drug therapy: Secondary | ICD-10-CM | POA: Diagnosis not present

## 2022-09-12 DIAGNOSIS — I2510 Atherosclerotic heart disease of native coronary artery without angina pectoris: Secondary | ICD-10-CM | POA: Insufficient documentation

## 2022-09-12 DIAGNOSIS — R102 Pelvic and perineal pain: Secondary | ICD-10-CM | POA: Diagnosis not present

## 2022-09-12 DIAGNOSIS — Z794 Long term (current) use of insulin: Secondary | ICD-10-CM | POA: Insufficient documentation

## 2022-09-12 DIAGNOSIS — Y92009 Unspecified place in unspecified non-institutional (private) residence as the place of occurrence of the external cause: Secondary | ICD-10-CM | POA: Diagnosis not present

## 2022-09-12 DIAGNOSIS — W19XXXA Unspecified fall, initial encounter: Secondary | ICD-10-CM | POA: Diagnosis not present

## 2022-09-12 DIAGNOSIS — I10 Essential (primary) hypertension: Secondary | ICD-10-CM | POA: Diagnosis not present

## 2022-09-12 DIAGNOSIS — Z043 Encounter for examination and observation following other accident: Secondary | ICD-10-CM | POA: Diagnosis not present

## 2022-09-12 DIAGNOSIS — R55 Syncope and collapse: Secondary | ICD-10-CM | POA: Diagnosis not present

## 2022-09-12 LAB — CBC WITH DIFFERENTIAL/PLATELET
Abs Immature Granulocytes: 0.02 10*3/uL (ref 0.00–0.07)
Basophils Absolute: 0 10*3/uL (ref 0.0–0.1)
Basophils Relative: 1 %
Eosinophils Absolute: 0.1 10*3/uL (ref 0.0–0.5)
Eosinophils Relative: 2 %
HCT: 36.5 % (ref 36.0–46.0)
Hemoglobin: 11.4 g/dL — ABNORMAL LOW (ref 12.0–15.0)
Immature Granulocytes: 0 %
Lymphocytes Relative: 28 %
Lymphs Abs: 1.7 10*3/uL (ref 0.7–4.0)
MCH: 30.1 pg (ref 26.0–34.0)
MCHC: 31.2 g/dL (ref 30.0–36.0)
MCV: 96.3 fL (ref 80.0–100.0)
Monocytes Absolute: 0.5 10*3/uL (ref 0.1–1.0)
Monocytes Relative: 9 %
Neutro Abs: 3.7 10*3/uL (ref 1.7–7.7)
Neutrophils Relative %: 60 %
Platelets: 215 10*3/uL (ref 150–400)
RBC: 3.79 MIL/uL — ABNORMAL LOW (ref 3.87–5.11)
RDW: 13.4 % (ref 11.5–15.5)
WBC: 6.1 10*3/uL (ref 4.0–10.5)
nRBC: 0 % (ref 0.0–0.2)

## 2022-09-12 LAB — COMPREHENSIVE METABOLIC PANEL
ALT: 9 U/L (ref 0–44)
AST: 19 U/L (ref 15–41)
Albumin: 2.8 g/dL — ABNORMAL LOW (ref 3.5–5.0)
Alkaline Phosphatase: 89 U/L (ref 38–126)
Anion gap: 9 (ref 5–15)
BUN: 18 mg/dL (ref 8–23)
CO2: 25 mmol/L (ref 22–32)
Calcium: 8.2 mg/dL — ABNORMAL LOW (ref 8.9–10.3)
Chloride: 102 mmol/L (ref 98–111)
Creatinine, Ser: 1.03 mg/dL — ABNORMAL HIGH (ref 0.44–1.00)
GFR, Estimated: 55 mL/min — ABNORMAL LOW (ref >60)
Glucose, Bld: 175 mg/dL — ABNORMAL HIGH (ref 70–99)
Potassium: 4.4 mmol/L (ref 3.5–5.1)
Sodium: 136 mmol/L (ref 135–145)
Total Bilirubin: 0.6 mg/dL (ref 0.3–1.2)
Total Protein: 6.1 g/dL — ABNORMAL LOW (ref 6.5–8.1)

## 2022-09-12 LAB — URINALYSIS, W/ REFLEX TO CULTURE (INFECTION SUSPECTED)
Bilirubin Urine: NEGATIVE
Glucose, UA: NEGATIVE mg/dL
Ketones, ur: NEGATIVE mg/dL
Leukocytes,Ua: NEGATIVE
Nitrite: NEGATIVE
Protein, ur: 100 mg/dL — AB
Specific Gravity, Urine: 1.01 (ref 1.005–1.030)
pH: 6 (ref 5.0–8.0)

## 2022-09-12 LAB — TROPONIN I (HIGH SENSITIVITY)
Troponin I (High Sensitivity): 5 ng/L (ref <18)
Troponin I (High Sensitivity): 4 ng/L (ref <18)

## 2022-09-12 LAB — BRAIN NATRIURETIC PEPTIDE: B Natriuretic Peptide: 75.7 pg/mL (ref 0.0–100.0)

## 2022-09-12 MED ORDER — GABAPENTIN 300 MG PO CAPS
300.0000 mg | ORAL_CAPSULE | Freq: Once | ORAL | Status: AC
  Administered 2022-09-12: 300 mg via ORAL
  Filled 2022-09-12: qty 1

## 2022-09-12 MED ORDER — AMLODIPINE BESYLATE 5 MG PO TABS
5.0000 mg | ORAL_TABLET | Freq: Once | ORAL | Status: AC
  Administered 2022-09-12: 5 mg via ORAL
  Filled 2022-09-12: qty 1

## 2022-09-12 MED ORDER — IPRATROPIUM-ALBUTEROL 0.5-2.5 (3) MG/3ML IN SOLN
3.0000 mL | Freq: Once | RESPIRATORY_TRACT | Status: AC
  Administered 2022-09-12: 3 mL via RESPIRATORY_TRACT
  Filled 2022-09-12: qty 3

## 2022-09-12 MED ORDER — ACETAMINOPHEN 500 MG PO TABS
1000.0000 mg | ORAL_TABLET | Freq: Once | ORAL | Status: AC
  Administered 2022-09-12: 1000 mg via ORAL
  Filled 2022-09-12: qty 2

## 2022-09-12 MED ORDER — TRAMADOL HCL 50 MG PO TABS
50.0000 mg | ORAL_TABLET | Freq: Once | ORAL | Status: AC
  Administered 2022-09-12: 50 mg via ORAL
  Filled 2022-09-12: qty 1

## 2022-09-12 MED ORDER — LEVETIRACETAM IN NACL 1000 MG/100ML IV SOLN
1000.0000 mg | Freq: Once | INTRAVENOUS | Status: AC
  Administered 2022-09-12: 1000 mg via INTRAVENOUS
  Filled 2022-09-12: qty 100

## 2022-09-12 NOTE — ED Triage Notes (Signed)
Pt bib ems from home c/o fall. Pt is unsure what happened and u/t express if she had LOC. Hx CHF, HTN, Seizure  Pt states she fell on carpet and uses a walker at home.   SBP 180 HR 80 RA 96% CBG 256

## 2022-09-12 NOTE — ED Provider Notes (Addendum)
[place] EMERGENCY DEPARTMENT AT [facility] Provider Note   CSN: [id number] Arrival date & time: 09/12/22  4098     History  Chief Complaint  Patient presents with   [name] is a 82 y.o. female.  With PMH of HTN, HLD, CAD, history of CVA, CHF, bedbound status who presents after possible syncopal episode.  Patient says she was in bed was complaining of pain and tingling sensation in bilateral legs and the next thing she remembered she was on the ground.  She denies getting up out of bed but reports these are the only memory she had from the event today.  She denies any loss of bladder or bowels.  She does not think she had a seizure.  She denies any active chest pain, shortness of breath or palpitations.  Denies any recent fevers or vomiting or diarrhea or urinary symptoms.  Was unable to get up as she typically gets around in a power chair.  Complaining of bilateral posterior shoulder pain and lower back pain.   Fall       Home Medications Prior to Admission medications   Medication Sig Start Date End Date Taking? Authorizing Provider  albuterol (PROVENTIL HFA) 108 (90 Base) MCG/ACT inhaler Inhale 2 puffs into the lungs every 4 (four) hours as needed for wheezing or shortness of breath. 06/12/17   Shon Hale, MD  amLODipine (NORVASC) 5 MG tablet Take 5 mg by mouth daily.    [provider]  aspirin EC 81 MG tablet Take 81 mg by mouth daily.    [provider]  Aspirin-Salicylamide-Caffeine (BC HEADACHE POWDER PO) Take 2 packets by mouth every evening.    [provider]  atorvastatin (LIPITOR) 40 MG tablet Take 1 tablet (40 mg total) by mouth daily. 04/24/19 05/14/22  Garnette Gunner, MD  cephALEXin (KEFLEX) 500 MG capsule Take 500 mg by mouth 4 (four) times daily.    [provider]  clopidogrel (PLAVIX) 75 MG tablet Take 1 tablet (75 mg total) by mouth daily. 05/14/22   Chuck Hint, MD   diclofenac Sodium (VOLTAREN) 1 % GEL Apply 2 g topically 4 (four) times daily. Patient taking differently: Apply 2 g topically daily as needed (pain). 04/02/20   Gailen Shelter, PA  furosemide (LASIX) 20 MG tablet Take 40 mg by mouth daily.    [provider]  gabapentin (NEURONTIN) 300 MG capsule TAKE 1 CAPSULE(300 MG) BY MOUTH TWICE DAILY Patient taking differently: Take 300 mg by mouth 2 (two) times daily. 05/09/18   Romero Belling, MD  gentamicin ointment (GARAMYCIN) 0.1 % Apply 1 Application topically daily. 04/20/22   McDonald, Rachelle Hora, DPM  glucose blood (ONETOUCH VERIO) test strip 1 each by Other route 2 (two) times daily. And lancets 2/day 07/20/17   Romero Belling, MD  Hyprom-Naphaz-Polysorb-Zn Sulf (CLEAR EYES COMPLETE OP) Place 2 drops into both eyes daily.    [provider]  ibuprofen (ADVIL) 200 MG tablet Take 400 mg by mouth every 6 (six) hours as needed for mild pain or moderate pain (With food).    [provider]  insulin glargine, 2 Unit Dial, (TOUJEO MAX SOLOSTAR) 300 UNIT/ML Solostar Pen Inject 100 Units into the skin daily.    [provider]  ipratropium-albuterol (DUONEB) 0.5-2.5 (3) MG/3ML SOLN Take 3 mLs by nebulization every 6 (six) hours as needed. Patient taking differently: Take 3 mLs by nebulization every 6 (six) hours as needed (Shortness of  breath). 06/12/17   Emokpae, Courage, MD  LEVEMIR FLEXPEN 100 UNIT/ML FlexPen Inject 100 Units into the skin 2 (two) times daily. 04/22/22   [provider]  levETIRAcetam (KEPPRA) 500 MG tablet Take 1 tablet (500 mg total) by mouth 2 (two) times daily. 04/24/19 05/13/22  Garnette Gunner, MD  polyethylene glycol [facility] Ethelene Hal) packet Take 17 g by mouth 2 (two) times daily. Patient taking differently: Take 17 g by mouth daily. Orange juice 06/12/17   Emokpae, Courage, MD  potassium chloride SA (KLOR-CON M) 20 MEQ tablet Take 20 mEq by mouth 2 (two) times daily.    [provider]   silver sulfADIAZINE (SILVADENE) 1 % cream Apply 1 application topically daily. 01/27/21   Louann Sjogren, DPM  traMADol (ULTRAM-ER) 100 MG 24 hr tablet Take 100 mg by mouth daily as needed for pain.    [provider]  triamcinolone cream (KENALOG) 0.1 % Apply 1 application  topically daily as needed (Apply to rash on face). 08/14/18   [provider]  promethazine (PHENERGAN) 25 MG tablet Take 1 tablet (25 mg total) by mouth every 6 (six) hours as needed for nausea. 06/25/15 06/25/15  Derwood Kaplan, MD      Allergies    Hydrocodone, Lisinopril, Pioglitazone, and Varenicline tartrate    Review of Systems   Review of Systems  Physical Exam Updated Vital Signs BP 117/60 (BP Location: Right Arm)   Pulse 77   Temp 97.6 F (36.4 C) (Oral)   Resp 14   Ht 5\' 6"  (1.676 m)   Wt 134 kg   SpO2 99%   BMI 47.68 kg/m  Physical Exam Constitutional: Alert and oriented. GCS 15. nontoxic Eyes: Conjunctivae are normal. ENT      Head: Normocephalic and atraumatic. Cardiovascular: S1, S2,  RRR, distal extremities warm and dry Respiratory: Coarse BS b/l O2 sat 100 on RA Gastrointestinal: Soft and nontender. Musculoskeletal: Normal range of motion in all extremities. Venous stasis changes on BLEs Neurologic: Normal speech and language. GCS 15. Moving extremities x4 equally. Sensation grossly intact. No gross focal neurologic deficits are appreciated. Skin: Skin is warm, dry Psychiatric: Mood and affect are normal. Speech and behavior are normal.  ED Results / Procedures / Treatments   Labs (all labs ordered are listed, but only abnormal results are displayed) Labs Reviewed  COMPREHENSIVE METABOLIC PANEL - Abnormal; Notable for the following components:      Result Value   Glucose, Bld 175 (*)    Creatinine, Ser 1.03 (*)    Calcium 8.2 (*)    Total Protein 6.1 (*)    Albumin 2.8 (*)    GFR, Estimated 55 (*)    All other components within normal limits  CBC WITH  DIFFERENTIAL/PLATELET - Abnormal; Notable for the following components:   RBC 3.79 (*)    Hemoglobin 11.4 (*)    All other components within normal limits  URINALYSIS, W/ REFLEX TO CULTURE (INFECTION SUSPECTED) - Abnormal; Notable for the following components:   Hgb urine dipstick SMALL (*)    Protein, ur 100 (*)    Bacteria, UA RARE (*)    All other components within normal limits  BRAIN NATRIURETIC PEPTIDE  TROPONIN I (HIGH SENSITIVITY)  TROPONIN I (HIGH SENSITIVITY)    EKG EKG Interpretation  Date/Time:  Sunday Sep 12 2022 08:16:32 EDT Ventricular Rate:  87 PR Interval:  161 QRS Duration: 113 QT Interval:  385 QTC Calculation: 464 R Axis:   10 Text Interpretation: Sinus  rhythm Incomplete left bundle branch block Low voltage, precordial leads No significant change since last tracing Confirmed by Vivien Rossetti ([id number]) on 09/12/2022 8:21:31 AM  Radiology CT Lumbar Spine Wo Contrast  Result Date: 09/12/2022 CLINICAL DATA:  Back pain after fall EXAM: CT LUMBAR SPINE WITHOUT CONTRAST TECHNIQUE: Multidetector CT imaging of the lumbar spine was performed without intravenous contrast administration. Multiplanar CT image reconstructions were also generated. RADIATION DOSE REDUCTION: This exam was performed according to the departmental dose-optimization program which includes automated exposure control, adjustment of the mA and/or kV according to patient size and/or use of iterative reconstruction technique. COMPARISON:  Renal stone CT 06/10/2018 FINDINGS: Segmentation: 5 lumbar type vertebrae. Alignment: Degenerative dextroscoliosis scratch the dextroscoliosis. Vertebrae: No acute fracture. No aggressive bone lesion. Degenerative sclerosis affecting multiple endplates. Paraspinal and other soft tissues: Cholelithiasis and atherosclerosis. No perispinal hematoma Disc levels: T11-12: Disc space narrowing with bulging and facet spurring - severe right foraminal impingement. T12- L1: Disc  narrowing and bulging with facet spurring greater on the right. Moderate right foraminal stenosis L1-L2: Disc narrowing and bulging with left paracentral protrusion and ridging. At least moderate left subarticular recess stenosis L2-L3: Disc narrowing and bulging with left foraminal protrusion and asymmetric left facet spurring. Left foraminal impingement. Left subarticular recess stenosis L3-L4: Disc collapse with gas containing fissure and pronounced endplate degeneration. There is endplate and facet spurring eccentric to the left. Severe left foraminal impingement. High-grade spinal stenosis L4-L5: Disc collapse with gas containing fissure and bulky facet/endplate spurring eccentric to the right. Moderate to advanced spinal stenosis. Moderate right foraminal impingement L5-S1:Advanced facet degeneration. Mild disc bulging. No neural impingement. IMPRESSION: 1. No acute finding. 2. 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 3. Cholelithiasis. Electronically Signed   By: Tiburcio Pea M.D.   On: 09/12/2022 10:27   DG Pelvis Portable  Result Date: 09/12/2022 CLINICAL DATA:  Pain after fall. EXAM: PORTABLE PELVIS 1 VIEWS COMPARISON:  None Available. FINDINGS: Suboptimal bony visualization due to underpenetration/body habitus. No hip dislocation or visible fracture. Lumbar spine degeneration. IMPRESSION: Limited study without acute finding. Electronically Signed   By: Tiburcio Pea M.D.   On: 09/12/2022 10:02   DG Chest Portable 1 View  Result Date: 09/12/2022 CLINICAL DATA:  Syncope.  Hypertension EXAM: PORTABLE CHEST 1 VIEW COMPARISON:  08/01/2019 FINDINGS: Right upper lobe nodule overlapping the anterior second rib. There is no edema, consolidation, effusion, or pneumothorax. Normal heart size and mediastinal contours. Artifact from EKG leads. IMPRESSION: 1. Right upper lobe nodule concerning for carcinoma by contemporaneous cervical spine CT. Recommend  outpatient follow-up. 2. No acute finding. Electronically Signed   By: Tiburcio Pea M.D.   On: 09/12/2022 10:01   CT Head Wo Contrast  Result Date: 09/12/2022 CLINICAL DATA:  Syncope and fall EXAM: CT HEAD WITHOUT CONTRAST CT CERVICAL SPINE WITHOUT CONTRAST TECHNIQUE: Multidetector CT imaging of the head and cervical spine was performed following the standard protocol without intravenous contrast. Multiplanar CT image reconstructions of the cervical spine were also generated. RADIATION DOSE REDUCTION: This exam was performed according to the departmental dose-optimization program which includes automated exposure control, adjustment of the mA and/or kV according to patient size and/or use of iterative reconstruction technique. COMPARISON:  None Available. FINDINGS: CT HEAD FINDINGS Brain: No evidence of acute infarction, hemorrhage, hydrocephalus, extra-axial collection or mass lesion/mass effect. Chronic small vessel ischemia in the cerebral white matter. Chronic lacunar infarct at the left thalamus. Mild ischemic gliosis in the cerebral white matter. Mild  atrophy Vascular: No hyperdense vessel or unexpected calcification. Skull: Normal. Negative for fracture or focal lesion. Sinuses/Orbits: No acute finding. CT CERVICAL SPINE FINDINGS Alignment: Reversal of cervical lordosis. Skull base and vertebrae: No acute fracture. No primary bone lesion or focal pathologic process. Soft tissues and spinal canal: No prevertebral fluid or swelling. No visible canal hematoma. Disc levels: Generalized degenerative disc space narrowing with endplate and facet spurring. Upper chest: Lobulated nodule in the right upper lobe not seen by remote chest radiographs, 17 mm. Consider one of the following in 3 months for both low-risk and high-risk individuals: (a) repeat chest CT, (b) follow-up PET-CT, or (c) tissue sampling. This recommendation follows the consensus statement: Guidelines for Management of Incidental Pulmonary  Nodules Detected on CT Images: From the Fleischner Society 2017; Radiology 2017; 284:228-243. IMPRESSION: 1. 17 mm nodule in the right upper lobe, most concerning for bronchogenic carcinoma. Recommend referral to multi disciplinary thoracic oncology service. 2. No evidence of acute intracranial or cervical spine injury. Electronically Signed   By: Tiburcio Pea M.D.   On: 09/12/2022 10:00   CT Cervical Spine Wo Contrast  Result Date: 09/12/2022 CLINICAL DATA:  Syncope and fall EXAM: CT HEAD WITHOUT CONTRAST CT CERVICAL SPINE WITHOUT CONTRAST TECHNIQUE: Multidetector CT imaging of the head and cervical spine was performed following the standard protocol without intravenous contrast. Multiplanar CT image reconstructions of the cervical spine were also generated. RADIATION DOSE REDUCTION: This exam was performed according to the departmental dose-optimization program which includes automated exposure control, adjustment of the mA and/or kV according to patient size and/or use of iterative reconstruction technique. COMPARISON:  None Available. FINDINGS: CT HEAD FINDINGS Brain: No evidence of acute infarction, hemorrhage, hydrocephalus, extra-axial collection or mass lesion/mass effect. Chronic small vessel ischemia in the cerebral white matter. Chronic lacunar infarct at the left thalamus. Mild ischemic gliosis in the cerebral white matter. Mild atrophy Vascular: No hyperdense vessel or unexpected calcification. Skull: Normal. Negative for fracture or focal lesion. Sinuses/Orbits: No acute finding. CT CERVICAL SPINE FINDINGS Alignment: Reversal of cervical lordosis. Skull base and vertebrae: No acute fracture. No primary bone lesion or focal pathologic process. Soft tissues and spinal canal: No prevertebral fluid or swelling. No visible canal hematoma. Disc levels: Generalized degenerative disc space narrowing with endplate and facet spurring. Upper chest: Lobulated nodule in the right upper lobe not seen by  remote chest radiographs, 17 mm. Consider one of the following in 3 months for both low-risk and high-risk individuals: (a) repeat chest CT, (b) follow-up PET-CT, or (c) tissue sampling. This recommendation follows the consensus statement: Guidelines for Management of Incidental Pulmonary Nodules Detected on CT Images: From the Fleischner Society 2017; Radiology 2017; 284:228-243. IMPRESSION: 1. 17 mm nodule in the right upper lobe, most concerning for bronchogenic carcinoma. Recommend referral to multi disciplinary thoracic oncology service. 2. No evidence of acute intracranial or cervical spine injury. Electronically Signed   By: Tiburcio Pea M.D.   On: 09/12/2022 10:00    Procedures Procedures    Medications Ordered in ED Medications  ipratropium-albuterol (DUONEB) 0.5-2.5 (3) MG/3ML nebulizer solution 3 mL (3 mLs Nebulization Given 09/12/22 0953)  levETIRAcetam (KEPPRA) IVPB 1000 mg/100 mL premix (0 mg Intravenous Stopped 09/12/22 1359)  amLODipine (NORVASC) tablet 5 mg (5 mg Oral Given 09/12/22 0953)  traMADol (ULTRAM) tablet 50 mg (50 mg Oral Given 09/12/22 0953)  gabapentin (NEURONTIN) capsule 300 mg (300 mg Oral Given 09/12/22 1143)  acetaminophen (TYLENOL) tablet 1,000 mg (1,000 mg Oral Given 09/12/22 1143)  ED Course/ Medical Decision Making/ A&P                             Medical Decision Making [name] is a 82 y.o. female.  With PMH of HTN, HLD, CAD, history of CVA, CHF, bedbound status who presents after possible syncopal episode.  Patient either had orthostatic episode after trying to stand out of bed and fell to the floor or fell to the floor and hit head with syncopal episode.    Although patient does have history of seizures, I doubt patient had seizure and she has no evidence of tongue bite and denies any incontinence and was not postictal on exam.  However with her history I did give her a loading dose of IV Keppra in ED.  She has no focal deficits on  exam no evidence of significant trauma on body exam.  Her CT head reviewed by me no ICH or acute findings as well as no acute traumatic injuries on CT C-spine without contrast and CT L-spine without contrast.  She had a chest x-ray and pelvis x-ray without any acute traumatic injuries.  EKG reviewed by me generally unchanged from prior with no cardiopulmonary complaints, not concern for cardiogenic syncope.  Additionally, her troponins repeat x 2 were unremarkable and reassuring at 4 on 5.  UA with no evidence of UTI.  Labs reviewed by me glucose 175 creatinine 1.03 consistent with baseline.  Mild anemia hemoglobin 11.4.  Patient workup has been unremarkable and does not require further workup or management in the hospital at this time as I am not concerned for cardiogenic syncope.  Of note her imaging did show incidental findings of new nodule in right upper lobe of the lung concerning for bronchogenic carcinoma.  For this reason I have referred patient to hematology oncology outpatient.  Return precautions have been discussed.  Patient discharged in stable condition. She is requesting to go home.    Amount and/or Complexity of Data Reviewed Labs: ordered. Radiology: ordered.  Risk OTC drugs. Prescription drug management.      Final Clinical Impression(s) / ED Diagnoses Final diagnoses:  Fall, initial encounter  Syncope, unspecified syncope type  Nodule of right lung    Rx / DC Orders ED Discharge Orders          Ordered    Ambulatory referral to Hematology / Oncology       Comments: Your emergency department provider has referred you to see a hematology/oncology specialist. These are physicians who specialize in blood disorders and cancers, or findings concerning for cancer. You will receive a phone call from the [facility] Office to set up your appointment within 2 business days: [employer] operate Mon - Fri, 8:00 a.m. to 5:00 p.m.; closed for federally recognized  holidays. Please be sure your phone is not set to block numbers during this time.   09/12/22 1309              Mardene Sayer, MD 09/13/22 1058    Mardene Sayer, MD 09/13/22 1059

## 2022-09-12 NOTE — ED Notes (Signed)
RN assisted pt with clothing and transported with wheelchair to lobby with daughter and grandbaby.

## 2022-09-12 NOTE — Discharge Instructions (Addendum)
You have been seen today in the Emergency Department (ED)  for fall and syncope (passing out).  Your workup including labs, imaging and EKG show reassuring results.  Your symptoms may be due to dehydration, so it is important that you drink plenty of non-alcoholic fluids. It is also important to avoid drugs and alcohol.  Of note, on your imaging today we did find evidence of a lung nodule in your right lung.  I have placed a referral to oncology for outpatient follow-up and further workup of this lung nodule.  If they do not call you in the next 2-3 business days, you can call the phone number listed in your discharge paperwork.  Please call your regular doctor as soon as possible to schedule the next available clinic appointment to follow up with him/her regarding your visit to the ED and your symptoms. You should ideally follow up with your primary doctor within one week.  Return to the Emergency Department (ED)  if you have any further syncopal episodes (pass out again) or develop ANY chest pain, pressure, tightness, palpitations, trouble breathing, sudden sweating, or other symptoms that concern you.

## 2022-09-12 NOTE — ED Notes (Signed)
Blood pressure @9 :51 was 187/97

## 2022-09-14 DIAGNOSIS — H5213 Myopia, bilateral: Secondary | ICD-10-CM | POA: Diagnosis not present

## 2022-09-14 DIAGNOSIS — Z135 Encounter for screening for eye and ear disorders: Secondary | ICD-10-CM | POA: Diagnosis not present

## 2022-09-14 DIAGNOSIS — Z961 Presence of intraocular lens: Secondary | ICD-10-CM | POA: Diagnosis not present

## 2022-09-16 ENCOUNTER — Telehealth: Payer: Self-pay

## 2022-09-16 NOTE — Telephone Encounter (Signed)
Transition Care Management Unsuccessful Follow-up Telephone Call  Date of discharge and from where:  [facility] 5/26  Attempts:  1st Attempt  Reason for unsuccessful TCM follow-up call:  Unable to leave message   Lenard Forth [facility] Guide, [facility] [phone] [address] [place], [place], [place] [address] Phone: [phone] Email: [email or web address]

## 2022-09-17 ENCOUNTER — Telehealth: Payer: Self-pay

## 2022-09-17 NOTE — Telephone Encounter (Signed)
Transition Care Management Unsuccessful Follow-up Telephone Call  Date of discharge and from where:  [facility] 5/26  Attempts:  2nd Attempt  Reason for unsuccessful TCM follow-up call:  Left voice message   [name] [facility] Guide, [place] [phone] [address], [place], [place] [address] Phone: [phone] Email: [email or web address]       

## 2022-09-20 ENCOUNTER — Encounter (INDEPENDENT_AMBULATORY_CARE_PROVIDER_SITE_OTHER): Payer: Medicare PPO | Admitting: Ophthalmology

## 2022-09-28 ENCOUNTER — Other Ambulatory Visit: Payer: Self-pay | Admitting: Medical Oncology

## 2022-09-28 ENCOUNTER — Encounter (INDEPENDENT_AMBULATORY_CARE_PROVIDER_SITE_OTHER): Payer: Medicare PPO | Admitting: Ophthalmology

## 2022-09-28 DIAGNOSIS — I10 Essential (primary) hypertension: Secondary | ICD-10-CM

## 2022-09-28 DIAGNOSIS — H34811 Central retinal vein occlusion, right eye, with macular edema: Secondary | ICD-10-CM

## 2022-09-28 DIAGNOSIS — H43813 Vitreous degeneration, bilateral: Secondary | ICD-10-CM

## 2022-09-28 DIAGNOSIS — Z794 Long term (current) use of insulin: Secondary | ICD-10-CM

## 2022-09-28 DIAGNOSIS — H35033 Hypertensive retinopathy, bilateral: Secondary | ICD-10-CM

## 2022-09-28 DIAGNOSIS — E113413 Type 2 diabetes mellitus with severe nonproliferative diabetic retinopathy with macular edema, bilateral: Secondary | ICD-10-CM

## 2022-09-28 DIAGNOSIS — R918 Other nonspecific abnormal finding of lung field: Secondary | ICD-10-CM

## 2022-09-29 ENCOUNTER — Inpatient Hospital Stay: Payer: Medicare PPO

## 2022-09-29 ENCOUNTER — Inpatient Hospital Stay: Payer: Medicare PPO | Attending: Internal Medicine | Admitting: Internal Medicine

## 2022-09-29 DIAGNOSIS — F17210 Nicotine dependence, cigarettes, uncomplicated: Secondary | ICD-10-CM | POA: Insufficient documentation

## 2022-09-29 DIAGNOSIS — R911 Solitary pulmonary nodule: Secondary | ICD-10-CM | POA: Insufficient documentation

## 2022-09-29 NOTE — Progress Notes (Signed)
NN reached out to pt to see if she was okay and why she hadn't yet shown up for her appt with Dr Arbutus Ped. NN spoke to pt's daughter, Belinda Block. Pricilla states that her mother had a "spell" this morning and didn't want to come to the appt. Pricilla states that the pt is okay, but she had an episode of getting angry and confused. NN verbalized understanding. Will reach back out to [facility] tomorrow about rescheduling the pt.

## 2022-10-07 ENCOUNTER — Ambulatory Visit (HOSPITAL_COMMUNITY)
Admission: RE | Admit: 2022-10-07 | Discharge: 2022-10-07 | Disposition: A | Discharge status: Home / Self Care | Payer: Medicare PPO | Source: Ambulatory Visit | Attending: Vascular Surgery | Admitting: Vascular Surgery

## 2022-10-07 ENCOUNTER — Encounter: Payer: Self-pay | Admitting: Physician Assistant

## 2022-10-07 ENCOUNTER — Ambulatory Visit (INDEPENDENT_AMBULATORY_CARE_PROVIDER_SITE_OTHER)
Admission: RE | Admit: 2022-10-07 | Discharge: 2022-10-07 | Disposition: A | Discharge status: Home / Self Care | Payer: Medicare PPO | Source: Ambulatory Visit | Attending: Vascular Surgery | Admitting: Vascular Surgery

## 2022-10-07 ENCOUNTER — Ambulatory Visit (INDEPENDENT_AMBULATORY_CARE_PROVIDER_SITE_OTHER): Payer: Medicare PPO | Admitting: Physician Assistant

## 2022-10-07 VITALS — BP 167/64 | HR 61 | Temp 98.0°F | Resp 20 | Wt 240.0 lb

## 2022-10-07 DIAGNOSIS — L97909 Non-pressure chronic ulcer of unspecified part of unspecified lower leg with unspecified severity: Secondary | ICD-10-CM

## 2022-10-07 DIAGNOSIS — I70299 Other atherosclerosis of native arteries of extremities, unspecified extremity: Secondary | ICD-10-CM | POA: Diagnosis not present

## 2022-10-07 DIAGNOSIS — I70292 Other atherosclerosis of native arteries of extremities, left leg: Secondary | ICD-10-CM

## 2022-10-07 LAB — VAS US ABI WITH/WO TBI
Left ABI: 1.14
Right ABI: 0.75
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
## 2022-10-14 ENCOUNTER — Inpatient Hospital Stay: Payer: Medicare PPO

## 2022-10-14 ENCOUNTER — Inpatient Hospital Stay (HOSPITAL_BASED_OUTPATIENT_CLINIC_OR_DEPARTMENT_OTHER): Payer: Medicare PPO | Admitting: Internal Medicine

## 2022-10-14 VITALS — BP 146/73 | HR 57 | Temp 97.5°F | Resp 16

## 2022-10-14 DIAGNOSIS — R918 Other nonspecific abnormal finding of lung field: Secondary | ICD-10-CM

## 2022-10-14 DIAGNOSIS — Z803 Family history of malignant neoplasm of breast: Secondary | ICD-10-CM

## 2022-10-14 DIAGNOSIS — R911 Solitary pulmonary nodule: Secondary | ICD-10-CM | POA: Diagnosis not present

## 2022-10-14 DIAGNOSIS — Z90710 Acquired absence of both cervix and uterus: Secondary | ICD-10-CM | POA: Diagnosis not present

## 2022-10-14 DIAGNOSIS — Z8041 Family history of malignant neoplasm of ovary: Secondary | ICD-10-CM

## 2022-10-14 DIAGNOSIS — F17210 Nicotine dependence, cigarettes, uncomplicated: Secondary | ICD-10-CM

## 2022-10-14 LAB — CBC WITH DIFFERENTIAL (CANCER CENTER ONLY)
Abs Immature Granulocytes: 0.01 10*3/uL (ref 0.00–0.07)
Basophils Absolute: 0 10*3/uL (ref 0.0–0.1)
Basophils Relative: 1 %
Eosinophils Absolute: 0.1 10*3/uL (ref 0.0–0.5)
Eosinophils Relative: 2 %
HCT: 36.1 % (ref 36.0–46.0)
Hemoglobin: 11.8 g/dL — ABNORMAL LOW (ref 12.0–15.0)
Immature Granulocytes: 0 %
Lymphocytes Relative: 34 %
Lymphs Abs: 1.6 10*3/uL (ref 0.7–4.0)
MCH: 30.7 pg (ref 26.0–34.0)
MCHC: 32.7 g/dL (ref 30.0–36.0)
MCV: 94 fL (ref 80.0–100.0)
Monocytes Absolute: 0.4 10*3/uL (ref 0.1–1.0)
Monocytes Relative: 8 %
Neutro Abs: 2.5 10*3/uL (ref 1.7–7.7)
Neutrophils Relative %: 55 %
Platelet Count: 245 10*3/uL (ref 150–400)
RBC: 3.84 MIL/uL — ABNORMAL LOW (ref 3.87–5.11)
RDW: 13.5 % (ref 11.5–15.5)
WBC Count: 4.6 10*3/uL (ref 4.0–10.5)
nRBC: 0 % (ref 0.0–0.2)

## 2022-10-14 LAB — CMP (CANCER CENTER ONLY)
ALT: 6 U/L (ref 0–44)
AST: 10 U/L — ABNORMAL LOW (ref 15–41)
Albumin: 3.3 g/dL — ABNORMAL LOW (ref 3.5–5.0)
Alkaline Phosphatase: 104 U/L (ref 38–126)
Anion gap: 4 — ABNORMAL LOW (ref 5–15)
BUN: 10 mg/dL (ref 8–23)
CO2: 31 mmol/L (ref 22–32)
Calcium: 9 mg/dL (ref 8.9–10.3)
Chloride: 104 mmol/L (ref 98–111)
Creatinine: 0.78 mg/dL (ref 0.44–1.00)
GFR, Estimated: >60 mL/min (ref >60)
Glucose, Bld: 90 mg/dL (ref 70–99)
Potassium: 3.9 mmol/L (ref 3.5–5.1)
Sodium: 139 mmol/L (ref 135–145)
Total Bilirubin: 0.4 mg/dL (ref 0.3–1.2)
Total Protein: 6.9 g/dL (ref 6.5–8.1)
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
## 2022-10-16 ENCOUNTER — Other Ambulatory Visit: Payer: Self-pay

## 2022-10-16 DIAGNOSIS — L97909 Non-pressure chronic ulcer of unspecified part of unspecified lower leg with unspecified severity: Secondary | ICD-10-CM

## 2022-10-26 ENCOUNTER — Encounter (INDEPENDENT_AMBULATORY_CARE_PROVIDER_SITE_OTHER): Payer: Medicare PPO | Admitting: Ophthalmology

## 2022-10-28 ENCOUNTER — Emergency Department (HOSPITAL_COMMUNITY): Payer: Medicare PPO

## 2022-10-28 ENCOUNTER — Inpatient Hospital Stay (HOSPITAL_COMMUNITY)
Admission: EM | Admit: 2022-10-28 | Discharge: 2022-11-05 | DRG: 689 | Disposition: A | Discharge status: Skilled Nursing Facility | Payer: Medicare PPO | Attending: Internal Medicine | Admitting: Internal Medicine

## 2022-10-28 ENCOUNTER — Observation Stay (HOSPITAL_COMMUNITY): Payer: Medicare PPO

## 2022-10-28 ENCOUNTER — Encounter (INDEPENDENT_AMBULATORY_CARE_PROVIDER_SITE_OTHER): Payer: Medicare PPO | Admitting: Ophthalmology

## 2022-10-28 ENCOUNTER — Emergency Department (HOSPITAL_BASED_OUTPATIENT_CLINIC_OR_DEPARTMENT_OTHER): Payer: Medicare PPO

## 2022-10-28 ENCOUNTER — Encounter (HOSPITAL_COMMUNITY): Payer: Self-pay | Admitting: Emergency Medicine

## 2022-10-28 ENCOUNTER — Other Ambulatory Visit: Payer: Self-pay

## 2022-10-28 DIAGNOSIS — F32A Depression, unspecified: Secondary | ICD-10-CM | POA: Diagnosis present

## 2022-10-28 DIAGNOSIS — Z7982 Long term (current) use of aspirin: Secondary | ICD-10-CM

## 2022-10-28 DIAGNOSIS — G479 Sleep disorder, unspecified: Secondary | ICD-10-CM | POA: Diagnosis present

## 2022-10-28 DIAGNOSIS — R569 Unspecified convulsions: Secondary | ICD-10-CM | POA: Diagnosis not present

## 2022-10-28 DIAGNOSIS — K7460 Unspecified cirrhosis of liver: Secondary | ICD-10-CM | POA: Diagnosis present

## 2022-10-28 DIAGNOSIS — J9621 Acute and chronic respiratory failure with hypoxia: Secondary | ICD-10-CM | POA: Diagnosis present

## 2022-10-28 DIAGNOSIS — M7989 Other specified soft tissue disorders: Secondary | ICD-10-CM | POA: Diagnosis not present

## 2022-10-28 DIAGNOSIS — N390 Urinary tract infection, site not specified: Secondary | ICD-10-CM | POA: Diagnosis not present

## 2022-10-28 DIAGNOSIS — K3184 Gastroparesis: Secondary | ICD-10-CM | POA: Diagnosis present

## 2022-10-28 DIAGNOSIS — F17210 Nicotine dependence, cigarettes, uncomplicated: Secondary | ICD-10-CM | POA: Diagnosis present

## 2022-10-28 DIAGNOSIS — I5032 Chronic diastolic (congestive) heart failure: Secondary | ICD-10-CM | POA: Diagnosis present

## 2022-10-28 DIAGNOSIS — M810 Age-related osteoporosis without current pathological fracture: Secondary | ICD-10-CM | POA: Diagnosis present

## 2022-10-28 DIAGNOSIS — G40909 Epilepsy, unspecified, not intractable, without status epilepticus: Secondary | ICD-10-CM | POA: Diagnosis present

## 2022-10-28 DIAGNOSIS — G43909 Migraine, unspecified, not intractable, without status migrainosus: Secondary | ICD-10-CM | POA: Diagnosis present

## 2022-10-28 DIAGNOSIS — I252 Old myocardial infarction: Secondary | ICD-10-CM

## 2022-10-28 DIAGNOSIS — Z66 Do not resuscitate: Secondary | ICD-10-CM | POA: Diagnosis present

## 2022-10-28 DIAGNOSIS — I959 Hypotension, unspecified: Secondary | ICD-10-CM | POA: Diagnosis not present

## 2022-10-28 DIAGNOSIS — B9689 Other specified bacterial agents as the cause of diseases classified elsewhere: Secondary | ICD-10-CM | POA: Diagnosis present

## 2022-10-28 DIAGNOSIS — M79652 Pain in left thigh: Secondary | ICD-10-CM | POA: Diagnosis not present

## 2022-10-28 DIAGNOSIS — Z888 Allergy status to other drugs, medicaments and biological substances status: Secondary | ICD-10-CM

## 2022-10-28 DIAGNOSIS — Z7401 Bed confinement status: Secondary | ICD-10-CM | POA: Diagnosis not present

## 2022-10-28 DIAGNOSIS — G9340 Encephalopathy, unspecified: Secondary | ICD-10-CM | POA: Diagnosis not present

## 2022-10-28 DIAGNOSIS — Z794 Long term (current) use of insulin: Secondary | ICD-10-CM

## 2022-10-28 DIAGNOSIS — G9341 Metabolic encephalopathy: Secondary | ICD-10-CM | POA: Diagnosis present

## 2022-10-28 DIAGNOSIS — E785 Hyperlipidemia, unspecified: Secondary | ICD-10-CM | POA: Diagnosis present

## 2022-10-28 DIAGNOSIS — I69351 Hemiplegia and hemiparesis following cerebral infarction affecting right dominant side: Secondary | ICD-10-CM | POA: Diagnosis not present

## 2022-10-28 DIAGNOSIS — J9622 Acute and chronic respiratory failure with hypercapnia: Secondary | ICD-10-CM | POA: Diagnosis present

## 2022-10-28 DIAGNOSIS — C3490 Malignant neoplasm of unspecified part of unspecified bronchus or lung: Secondary | ICD-10-CM | POA: Diagnosis not present

## 2022-10-28 DIAGNOSIS — J9601 Acute respiratory failure with hypoxia: Secondary | ICD-10-CM

## 2022-10-28 DIAGNOSIS — Z720 Tobacco use: Secondary | ICD-10-CM | POA: Diagnosis not present

## 2022-10-28 DIAGNOSIS — R519 Headache, unspecified: Secondary | ICD-10-CM | POA: Diagnosis not present

## 2022-10-28 DIAGNOSIS — R531 Weakness: Secondary | ICD-10-CM | POA: Diagnosis not present

## 2022-10-28 DIAGNOSIS — E7800 Pure hypercholesterolemia, unspecified: Secondary | ICD-10-CM | POA: Diagnosis present

## 2022-10-28 DIAGNOSIS — R4182 Altered mental status, unspecified: Principal | ICD-10-CM

## 2022-10-28 DIAGNOSIS — Z8669 Personal history of other diseases of the nervous system and sense organs: Secondary | ICD-10-CM

## 2022-10-28 DIAGNOSIS — G4730 Sleep apnea, unspecified: Secondary | ICD-10-CM | POA: Diagnosis not present

## 2022-10-28 DIAGNOSIS — N3000 Acute cystitis without hematuria: Secondary | ICD-10-CM | POA: Diagnosis present

## 2022-10-28 DIAGNOSIS — R1110 Vomiting, unspecified: Secondary | ICD-10-CM | POA: Diagnosis present

## 2022-10-28 DIAGNOSIS — Z8249 Family history of ischemic heart disease and other diseases of the circulatory system: Secondary | ICD-10-CM

## 2022-10-28 DIAGNOSIS — I110 Hypertensive heart disease with heart failure: Secondary | ICD-10-CM | POA: Diagnosis present

## 2022-10-28 DIAGNOSIS — E119 Type 2 diabetes mellitus without complications: Secondary | ICD-10-CM

## 2022-10-28 DIAGNOSIS — I10 Essential (primary) hypertension: Secondary | ICD-10-CM | POA: Diagnosis present

## 2022-10-28 DIAGNOSIS — E669 Obesity, unspecified: Secondary | ICD-10-CM | POA: Diagnosis present

## 2022-10-28 DIAGNOSIS — Z86010 Personal history of colonic polyps: Secondary | ICD-10-CM

## 2022-10-28 DIAGNOSIS — Z471 Aftercare following joint replacement surgery: Secondary | ICD-10-CM | POA: Diagnosis not present

## 2022-10-28 DIAGNOSIS — R443 Hallucinations, unspecified: Secondary | ICD-10-CM | POA: Diagnosis not present

## 2022-10-28 DIAGNOSIS — Z961 Presence of intraocular lens: Secondary | ICD-10-CM | POA: Diagnosis present

## 2022-10-28 DIAGNOSIS — M6281 Muscle weakness (generalized): Secondary | ICD-10-CM | POA: Diagnosis not present

## 2022-10-28 DIAGNOSIS — G4733 Obstructive sleep apnea (adult) (pediatric): Secondary | ICD-10-CM | POA: Diagnosis present

## 2022-10-28 DIAGNOSIS — R911 Solitary pulmonary nodule: Secondary | ICD-10-CM | POA: Diagnosis present

## 2022-10-28 DIAGNOSIS — R338 Other retention of urine: Secondary | ICD-10-CM

## 2022-10-28 DIAGNOSIS — Z1152 Encounter for screening for COVID-19: Secondary | ICD-10-CM

## 2022-10-28 DIAGNOSIS — G43009 Migraine without aura, not intractable, without status migrainosus: Secondary | ICD-10-CM | POA: Diagnosis not present

## 2022-10-28 DIAGNOSIS — Z90710 Acquired absence of both cervix and uterus: Secondary | ICD-10-CM

## 2022-10-28 DIAGNOSIS — Z885 Allergy status to narcotic agent status: Secondary | ICD-10-CM

## 2022-10-28 DIAGNOSIS — R69 Illness, unspecified: Secondary | ICD-10-CM | POA: Diagnosis not present

## 2022-10-28 DIAGNOSIS — R079 Chest pain, unspecified: Secondary | ICD-10-CM | POA: Diagnosis not present

## 2022-10-28 DIAGNOSIS — Z7902 Long term (current) use of antithrombotics/antiplatelets: Secondary | ICD-10-CM

## 2022-10-28 DIAGNOSIS — E1142 Type 2 diabetes mellitus with diabetic polyneuropathy: Secondary | ICD-10-CM | POA: Diagnosis present

## 2022-10-28 DIAGNOSIS — M549 Dorsalgia, unspecified: Secondary | ICD-10-CM | POA: Diagnosis present

## 2022-10-28 DIAGNOSIS — K760 Fatty (change of) liver, not elsewhere classified: Secondary | ICD-10-CM | POA: Diagnosis present

## 2022-10-28 DIAGNOSIS — K219 Gastro-esophageal reflux disease without esophagitis: Secondary | ICD-10-CM | POA: Diagnosis present

## 2022-10-28 DIAGNOSIS — M1990 Unspecified osteoarthritis, unspecified site: Secondary | ICD-10-CM | POA: Diagnosis present

## 2022-10-28 DIAGNOSIS — M1712 Unilateral primary osteoarthritis, left knee: Secondary | ICD-10-CM | POA: Diagnosis not present

## 2022-10-28 DIAGNOSIS — Z6838 Body mass index (BMI) 38.0-38.9, adult: Secondary | ICD-10-CM

## 2022-10-28 DIAGNOSIS — Z9841 Cataract extraction status, right eye: Secondary | ICD-10-CM

## 2022-10-28 DIAGNOSIS — Z9842 Cataract extraction status, left eye: Secondary | ICD-10-CM

## 2022-10-28 DIAGNOSIS — G8929 Other chronic pain: Secondary | ICD-10-CM | POA: Diagnosis present

## 2022-10-28 DIAGNOSIS — J440 Chronic obstructive pulmonary disease with acute lower respiratory infection: Secondary | ICD-10-CM | POA: Diagnosis not present

## 2022-10-28 DIAGNOSIS — J4489 Other specified chronic obstructive pulmonary disease: Secondary | ICD-10-CM | POA: Diagnosis present

## 2022-10-28 DIAGNOSIS — E1143 Type 2 diabetes mellitus with diabetic autonomic (poly)neuropathy: Secondary | ICD-10-CM | POA: Diagnosis present

## 2022-10-28 DIAGNOSIS — I69359 Hemiplegia and hemiparesis following cerebral infarction affecting unspecified side: Secondary | ICD-10-CM

## 2022-10-28 DIAGNOSIS — Z79899 Other long term (current) drug therapy: Secondary | ICD-10-CM

## 2022-10-28 DIAGNOSIS — Z833 Family history of diabetes mellitus: Secondary | ICD-10-CM

## 2022-10-28 DIAGNOSIS — M85852 Other specified disorders of bone density and structure, left thigh: Secondary | ICD-10-CM | POA: Diagnosis not present

## 2022-10-28 DIAGNOSIS — I2510 Atherosclerotic heart disease of native coronary artery without angina pectoris: Secondary | ICD-10-CM | POA: Diagnosis present

## 2022-10-28 DIAGNOSIS — R9431 Abnormal electrocardiogram [ECG] [EKG]: Secondary | ICD-10-CM | POA: Diagnosis not present

## 2022-10-28 DIAGNOSIS — J9602 Acute respiratory failure with hypercapnia: Secondary | ICD-10-CM | POA: Diagnosis not present

## 2022-10-28 DIAGNOSIS — R102 Pelvic and perineal pain: Secondary | ICD-10-CM | POA: Diagnosis not present

## 2022-10-28 HISTORY — DX: Other specified cough: R05.8

## 2022-10-28 HISTORY — DX: Other specified cough: T46.4X5A

## 2022-10-28 LAB — URINALYSIS, W/ REFLEX TO CULTURE (INFECTION SUSPECTED)
Bilirubin Urine: NEGATIVE
Glucose, UA: NEGATIVE mg/dL
Hgb urine dipstick: NEGATIVE
Ketones, ur: 5 mg/dL — AB
Leukocytes,Ua: NEGATIVE
Nitrite: NEGATIVE
Protein, ur: 100 mg/dL — AB
Specific Gravity, Urine: 1.024 (ref 1.005–1.030)
pH: 5 (ref 5.0–8.0)

## 2022-10-28 LAB — COMPREHENSIVE METABOLIC PANEL
ALT: 9 U/L (ref 0–44)
AST: 12 U/L — ABNORMAL LOW (ref 15–41)
Albumin: 3.2 g/dL — ABNORMAL LOW (ref 3.5–5.0)
Alkaline Phosphatase: 105 U/L (ref 38–126)
Anion gap: 7 (ref 5–15)
BUN: 23 mg/dL (ref 8–23)
CO2: 28 mmol/L (ref 22–32)
Calcium: 8.7 mg/dL — ABNORMAL LOW (ref 8.9–10.3)
Chloride: 102 mmol/L (ref 98–111)
Creatinine, Ser: 0.94 mg/dL (ref 0.44–1.00)
GFR, Estimated: >60 mL/min (ref >60)
Glucose, Bld: 155 mg/dL — ABNORMAL HIGH (ref 70–99)
Potassium: 3.8 mmol/L (ref 3.5–5.1)
Sodium: 137 mmol/L (ref 135–145)
Total Bilirubin: 0.5 mg/dL (ref 0.3–1.2)
Total Protein: 7.1 g/dL (ref 6.5–8.1)

## 2022-10-28 LAB — CBC WITH DIFFERENTIAL/PLATELET
Abs Immature Granulocytes: 0.01 10*3/uL (ref 0.00–0.07)
Basophils Absolute: 0 10*3/uL (ref 0.0–0.1)
Basophils Relative: 1 %
Eosinophils Absolute: 0.1 10*3/uL (ref 0.0–0.5)
Eosinophils Relative: 2 %
HCT: 36 % (ref 36.0–46.0)
Hemoglobin: 11.2 g/dL — ABNORMAL LOW (ref 12.0–15.0)
Immature Granulocytes: 0 %
Lymphocytes Relative: 26 %
Lymphs Abs: 1.8 10*3/uL (ref 0.7–4.0)
MCH: 30 pg (ref 26.0–34.0)
MCHC: 31.1 g/dL (ref 30.0–36.0)
MCV: 96.5 fL (ref 80.0–100.0)
Monocytes Absolute: 0.6 10*3/uL (ref 0.1–1.0)
Monocytes Relative: 9 %
Neutro Abs: 4.4 10*3/uL (ref 1.7–7.7)
Neutrophils Relative %: 62 %
Platelets: 250 10*3/uL (ref 150–400)
RBC: 3.73 MIL/uL — ABNORMAL LOW (ref 3.87–5.11)
RDW: 13.7 % (ref 11.5–15.5)
WBC: 7 10*3/uL (ref 4.0–10.5)
nRBC: 0 % (ref 0.0–0.2)

## 2022-10-28 LAB — RAPID URINE DRUG SCREEN, HOSP PERFORMED
Amphetamines: NOT DETECTED
Barbiturates: NOT DETECTED
Benzodiazepines: NOT DETECTED
Cocaine: NOT DETECTED
Opiates: NOT DETECTED
Tetrahydrocannabinol: NOT DETECTED

## 2022-10-28 LAB — GLUCOSE, CAPILLARY
Glucose-Capillary: 126 mg/dL — ABNORMAL HIGH (ref 70–99)
Glucose-Capillary: 136 mg/dL — ABNORMAL HIGH (ref 70–99)

## 2022-10-28 LAB — TROPONIN I (HIGH SENSITIVITY): Troponin I (High Sensitivity): 4 ng/L (ref <18)

## 2022-10-28 LAB — SARS CORONAVIRUS 2 BY RT PCR: SARS Coronavirus 2 by RT PCR: NEGATIVE

## 2022-10-28 LAB — BRAIN NATRIURETIC PEPTIDE: B Natriuretic Peptide: 62.8 pg/mL (ref 0.0–100.0)

## 2022-10-28 LAB — PROTIME-INR
INR: 1 (ref 0.8–1.2)
Prothrombin Time: 13.7 seconds (ref 11.4–15.2)

## 2022-10-28 LAB — LACTIC ACID, PLASMA: Lactic Acid, Venous: 0.8 mmol/L (ref 0.5–1.9)

## 2022-10-28 LAB — CK: Total CK: 161 U/L (ref 38–234)

## 2022-10-28 LAB — AMMONIA: Ammonia: 20 umol/L (ref 9–35)

## 2022-10-28 LAB — ETHANOL: Alcohol, Ethyl (B): <10 mg/dL (ref <10)

## 2022-10-28 MED ORDER — KETOROLAC TROMETHAMINE 15 MG/ML IJ SOLN
15.0000 mg | Freq: Once | INTRAMUSCULAR | Status: AC
  Administered 2022-10-29: 15 mg via INTRAVENOUS
  Filled 2022-10-28: qty 1

## 2022-10-28 MED ORDER — SODIUM CHLORIDE 0.45 % IV SOLN: INTRAVENOUS | Status: DC

## 2022-10-28 MED ORDER — INSULIN ASPART 100 UNIT/ML IJ SOLN: 0.0000 [IU] | Freq: Three times a day (TID) | INTRAMUSCULAR | Status: DC

## 2022-10-28 MED ORDER — POTASSIUM CHLORIDE CRYS ER 20 MEQ PO TBCR
20.0000 meq | EXTENDED_RELEASE_TABLET | Freq: Two times a day (BID) | ORAL | Status: DC
  Administered 2022-10-28 – 2022-10-30 (×2): 20 meq via ORAL
  Filled 2022-10-28 (×2): qty 1

## 2022-10-28 MED ORDER — LACTATED RINGERS IV BOLUS
500.0000 mL | Freq: Once | INTRAVENOUS | Status: AC
  Administered 2022-10-28: 500 mL via INTRAVENOUS

## 2022-10-28 MED ORDER — ACETAMINOPHEN 650 MG RE SUPP: 650.0000 mg | Freq: Four times a day (QID) | RECTAL | Status: DC | PRN

## 2022-10-28 MED ORDER — GABAPENTIN 300 MG PO CAPS
300.0000 mg | ORAL_CAPSULE | Freq: Two times a day (BID) | ORAL | Status: DC
  Administered 2022-10-28 – 2022-11-05 (×13): 300 mg via ORAL
  Filled 2022-10-28 (×14): qty 1

## 2022-10-28 MED ORDER — FENTANYL CITRATE PF 50 MCG/ML IJ SOSY
50.0000 ug | PREFILLED_SYRINGE | Freq: Once | INTRAMUSCULAR | Status: AC
  Administered 2022-10-28: 50 ug via INTRAVENOUS
  Filled 2022-10-28: qty 1

## 2022-10-28 MED ORDER — INSULIN GLARGINE-YFGN 100 UNIT/ML ~~LOC~~ SOLN: 20.0000 [IU] | Freq: Every day | SUBCUTANEOUS | Status: DC

## 2022-10-28 MED ORDER — ACETAMINOPHEN 500 MG PO TABS
1000.0000 mg | ORAL_TABLET | Freq: Once | ORAL | Status: AC
  Administered 2022-10-29: 1000 mg via ORAL
  Filled 2022-10-28: qty 2

## 2022-10-28 MED ORDER — ACETAMINOPHEN 325 MG PO TABS
650.0000 mg | ORAL_TABLET | Freq: Four times a day (QID) | ORAL | Status: DC | PRN
  Administered 2022-10-29 – 2022-11-04 (×5): 650 mg via ORAL
  Filled 2022-10-28 (×5): qty 2

## 2022-10-28 MED ORDER — ENSURE ENLIVE PO LIQD
237.0000 mL | Freq: Two times a day (BID) | ORAL | Status: DC
  Administered 2022-10-30 – 2022-11-04 (×10): 237 mL via ORAL

## 2022-10-28 MED ORDER — HALOPERIDOL LACTATE 5 MG/ML IJ SOLN
5.0000 mg | Freq: Four times a day (QID) | INTRAMUSCULAR | Status: AC | PRN
  Administered 2022-10-29: 5 mg via INTRAVENOUS
  Filled 2022-10-28: qty 1

## 2022-10-28 MED ORDER — KETOROLAC TROMETHAMINE 15 MG/ML IJ SOLN
15.0000 mg | Freq: Once | INTRAMUSCULAR | Status: AC
  Administered 2022-10-28: 15 mg via INTRAVENOUS
  Filled 2022-10-28: qty 1

## 2022-10-28 MED ORDER — CLOPIDOGREL BISULFATE 75 MG PO TABS
75.0000 mg | ORAL_TABLET | Freq: Every day | ORAL | Status: DC
  Administered 2022-10-28 – 2022-11-05 (×8): 75 mg via ORAL
  Filled 2022-10-28 (×8): qty 1

## 2022-10-28 MED ORDER — AMLODIPINE BESYLATE 5 MG PO TABS
5.0000 mg | ORAL_TABLET | Freq: Every day | ORAL | Status: DC
  Administered 2022-10-28: 5 mg via ORAL
  Filled 2022-10-28: qty 1

## 2022-10-28 MED ORDER — FUROSEMIDE 40 MG PO TABS
40.0000 mg | ORAL_TABLET | Freq: Every day | ORAL | Status: DC
  Administered 2022-10-28: 40 mg via ORAL
  Filled 2022-10-28: qty 1

## 2022-10-28 MED ORDER — SODIUM CHLORIDE 0.9 % IV SOLN
1.0000 g | Freq: Once | INTRAVENOUS | Status: AC
  Administered 2022-10-28: 1 g via INTRAVENOUS
  Filled 2022-10-28: qty 10

## 2022-10-28 MED ORDER — ONDANSETRON HCL 4 MG PO TABS: 4.0000 mg | ORAL_TABLET | Freq: Four times a day (QID) | ORAL | Status: DC | PRN

## 2022-10-28 MED ORDER — INSULIN DETEMIR 100 UNIT/ML FLEXPEN: 100.0000 [IU] | PEN_INJECTOR | Freq: Two times a day (BID) | SUBCUTANEOUS | Status: DC

## 2022-10-28 MED ORDER — LACTATED RINGERS IV BOLUS: 1000.0000 mL | Freq: Once | INTRAVENOUS | Status: DC

## 2022-10-28 MED ORDER — ALBUTEROL SULFATE (2.5 MG/3ML) 0.083% IN NEBU: 2.5000 mg | INHALATION_SOLUTION | RESPIRATORY_TRACT | Status: DC | PRN

## 2022-10-28 MED ORDER — LEVETIRACETAM 500 MG PO TABS
500.0000 mg | ORAL_TABLET | Freq: Two times a day (BID) | ORAL | Status: DC
  Administered 2022-10-28: 500 mg via ORAL
  Filled 2022-10-28: qty 1

## 2022-10-28 MED ORDER — ORAL CARE MOUTH RINSE
15.0000 mL | OROMUCOSAL | Status: DC | PRN
  Administered 2022-11-01: 15 mL via OROMUCOSAL

## 2022-10-28 MED ORDER — INSULIN ASPART 100 UNIT/ML IJ SOLN: 0.0000 [IU] | Freq: Every day | INTRAMUSCULAR | Status: DC

## 2022-10-28 MED ORDER — ENOXAPARIN SODIUM 40 MG/0.4ML IJ SOSY
40.0000 mg | PREFILLED_SYRINGE | INTRAMUSCULAR | Status: DC
  Administered 2022-10-28 – 2022-11-04 (×8): 40 mg via SUBCUTANEOUS
  Filled 2022-10-28 (×8): qty 0.4

## 2022-10-28 MED ORDER — DIAZEPAM 5 MG/ML IJ SOLN
5.0000 mg | Freq: Once | INTRAMUSCULAR | Status: AC | PRN
  Administered 2022-10-28: 5 mg via INTRAVENOUS
  Filled 2022-10-28: qty 2

## 2022-10-28 MED ORDER — ASPIRIN 81 MG PO TBEC
81.0000 mg | DELAYED_RELEASE_TABLET | Freq: Every day | ORAL | Status: DC
  Administered 2022-10-28 – 2022-11-04 (×7): 81 mg via ORAL
  Filled 2022-10-28 (×8): qty 1

## 2022-10-28 MED ORDER — ONDANSETRON HCL 4 MG/2ML IJ SOLN
4.0000 mg | Freq: Four times a day (QID) | INTRAMUSCULAR | Status: DC | PRN
  Administered 2022-11-05: 4 mg via INTRAVENOUS
  Filled 2022-10-28: qty 2

## 2022-10-28 NOTE — ED Notes (Signed)
Pt endorses left thigh swelling since a procedure that she believes was 2 months ago.

## 2022-10-28 NOTE — ED Notes (Signed)
ED TO INPATIENT HANDOFF REPORT  Name/Age/Gender [place] Kin[place] 82 y.o. female  Code Status Code Status History     Date Active Date Inactive Code Status Order ID Comments User Context   05/14/2022 1025 05/14/2022 1852 Full Code [id number]  Chuck Hint, MD Inpatient   04/20/2019 1607 04/24/2019 2233 Full Code [id number]  Ellwood Dense, DO ED   06/08/2017 1644 06/12/2017 2223 Full Code [id number]  Haydee Monica, MD ED   05/06/2015 1644 05/12/2015 0840 Full Code [id number]  Ozella Rocks, MD ED   11/22/2012 1646 11/24/2012 1609 Full Code [id number]  Joseph Art, DO Inpatient    Questions for Most Recent Historical Code Status (Order [id number])     Question Answer   By: Consent: discussion documented in EHR            Home/SNF/Other Home  Chief Complaint Acute metabolic encephalopathy [G93.41]  Level of Care/Admitting Diagnosis ED Disposition     ED Disposition  Admit   Condition  --   Comment  Hospital Area: [facility] [[id number]]  Level of Care: Progressive [102]  Admit to Progressive based on following criteria: MULTISYSTEM THREATS such as stable sepsis, metabolic/electrolyte imbalance with or without encephalopathy that is responding to early treatment.  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     CHF (congestive heart failure) (HCC)    Cholelithiasis    Chronic back pain    CIRRHOSIS 10/29/2006   COLONIC POLYPS 09/13/2007   COPD 04/22/2009   DEGENERATIVE JOINT DISEASE 06/15/2007   DEPRESSION 02/22/2008   Depression    Diabetes mellitus (HCC)    Diverticulosis    Fatty liver    Gastritis    Gastroparesis    GERD 10/29/2006   Headache(784.0)    "not everyday now" (11/22/2012)   Heart failure with preserved ejection fraction (HCC)    Hepatic cirrhosis (HCC)    HYPERCHOLESTEROLEMIA 02/25/2009   Hyperlipidemia    HYPERTENSION 12/20/2008   Hypertension    Migraine    Myocardial infarction (HCC)    Neuropathy    Obesity    OSA (obstructive sleep apnea)    "quit wearing my CPAP" (11/22/2012)   OSTEOPOROSIS 10/29/2006   SEIZURE DISORDER    "silent seizures" (11/22/2012)   Shortness of breath    "off and on" (11/22/2012)   Stroke (HCC) 04/2004   [name] 04/22/2004; "still weaker on the right" (11/22/2012)   Stroke (HCC)    Type II diabetes mellitus (HCC)     Allergies Allergies  Allergen Reactions   Hydrocodone Other (See Comments)    Hallucination   Lisinopril Cough   Pioglitazone Swelling    edema  Varenicline Tartrate Other (See Comments)    Bad dreams    IV Location/Drains/Wounds Patient Lines/Drains/Airways Status     Active Line/Drains/Airways     Name Placement date Placement time Site Days   Peripheral IV 10/28/22 20 G 1" Left Antecubital 10/28/22  1000  Antecubital  less than 1   Wound / Incision (Open or Dehisced) 04/20/19 Other (Comment) Thigh Left;Posterior;Proximal;Upper;Right 04/20/19  2200  Thigh  1287            Labs/Imaging Results for orders placed or performed during the hospital encounter of 10/28/22 (from the past 48 hour(s))  Ammonia     Status: None   Collection Time: 10/28/22   9:57 AM  Result Value Ref Range   Ammonia 20 9 - 35 umol/L    Comment: Performed at [facility], [address], [place], [place] [address]  Comprehensive metabolic panel     Status: Abnormal   Collection Time: 10/28/22  9:57 AM  Result Value Ref Range   Sodium 137 135 - 145 mmol/L   Potassium 3.8 3.5 - 5.1 mmol/L   Chloride 102 98 - 111 mmol/L   CO2 28 22 - 32 mmol/L   Glucose, Bld 155 (H) 70 - 99 mg/dL    Comment: Glucose reference range applies only to samples taken after fasting for at least 8 hours.   BUN 23 8 - 23 mg/dL   Creatinine, Ser 6.04 0.44 - 1.00 mg/dL   Calcium 8.7 (L) 8.9 - 10.3 mg/dL   Total Protein 7.1 6.5 - 8.1 g/dL   Albumin 3.2 (L) 3.5 - 5.0 g/dL   AST 12 (L) 15 - 41 U/L   ALT 9 0 - 44 U/L   Alkaline Phosphatase 105 38 - 126 U/L   Total Bilirubin 0.5 0.3 - 1.2 mg/dL   GFR, Estimated >54 >09 mL/min    Comment: (NOTE) Calculated using the CKD-EPI Creatinine Equation (2021)    Anion gap 7 5 - 15    Comment: Performed at [facility], [address], [place], [place] [address]  Troponin I (High Sensitivity)     Status: None   Collection Time: 10/28/22  9:57 AM  Result Value Ref Range   Troponin I (High Sensitivity) 4 <18 ng/L    Comment: (NOTE) Elevated high sensitivity troponin I (hsTnI) values and significant  changes across serial measurements may suggest ACS but many other  chronic and acute conditions are known to elevate hsTnI results.  Refer to the "Links" section for chest pain algorithms and additional  guidance. Performed at [facility], [address] [place], [place] [address]   Lactic acid, plasma     Status: None   Collection Time: 10/28/22  9:57 AM  Result Value Ref Range   Lactic Acid, Venous 0.8 0.5 - 1.9 mmol/L    Comment: Performed at [facility], [address] [place], [place] [address]  CBC with Differential     Status: Abnormal   Collection Time:  10/28/22  9:57 AM  Result Value Ref Range   WBC 7.0 4.0 - 10.5 K/uL   RBC 3.73 (L) 3.87 - 5.11 MIL/uL   Hemoglobin 11.2 (L) 12.0 - 15.0 g/dL   HCT 08.6 57.8 - 46.9 %   MCV 96.5 80.0 - 100.0 fL   MCH 30.0 26.0 - 34.0 pg   MCHC 31.1 30.0 - 36.0 g/dL   RDW 62.9 52.8 - 41.3 %   Platelets 250 150 -  400 K/uL   nRBC 0.0 0.0 - 0.2 %   Neutrophils Relative % 62 %   Neutro Abs 4.4 1.7 - 7.7 K/uL   Lymphocytes Relative 26 %   Lymphs Abs 1.8 0.7 - 4.0 K/uL   Monocytes Relative 9 %   Monocytes Absolute 0.6 0.1 - 1.0 K/uL   Eosinophils Relative 2 %   Eosinophils Absolute 0.1 0.0 - 0.5 K/uL   Basophils Relative 1 %   Basophils Absolute 0.0 0.0 - 0.1 K/uL   Immature Granulocytes 0 %   Abs Immature Granulocytes 0.01 0.00 - 0.07 K/uL    Comment: Performed at [facility], [address] [place], [place] [address]  CK     Status: None   Collection Time: 10/28/22  9:57 AM  Result Value Ref Range   Total CK 161 38 - 234 U/L    Comment: Performed at [facility], [address], [place], [place] [address]  Protime-INR     Status: None   Collection Time: 10/28/22  9:57 AM  Result Value Ref Range   Prothrombin Time 13.7 11.4 - 15.2 seconds   INR 1.0 0.8 - 1.2    Comment: (NOTE) INR goal varies based on device and disease states. Performed at [facility], [address] [place], [place] [address]   Brain natriuretic peptide     Status: None   Collection Time: 10/28/22  9:57 AM  Result Value Ref Range   B Natriuretic Peptide 62.8 0.0 - 100.0 pg/mL    Comment: Performed at [facility], [address] [place], [place] [address]  SARS Coronavirus 2 by RT PCR (hospital order, performed in [facility] hospital lab) *cepheid single result test* Anterior Nasal Swab     Status: None   Collection Time: 10/28/22 10:37 AM   Specimen: Anterior Nasal Swab  Result Value Ref Range   SARS Coronavirus 2 by RT PCR NEGATIVE NEGATIVE     Comment: (NOTE) SARS-CoV-2 target nucleic acids are NOT DETECTED.  The SARS-CoV-2 RNA is generally detectable in upper and lower respiratory specimens during the acute phase of infection. The lowest concentration of SARS-CoV-2 viral copies this assay can detect is 250 copies / mL. A negative result does not preclude SARS-CoV-2 infection and should not be used as the sole basis for treatment or other patient management decisions.  A negative result may occur with improper specimen collection / handling, submission of specimen other than nasopharyngeal swab, presence of viral mutation(s) within the areas targeted by this assay, and inadequate number of viral copies (<250 copies / mL). A negative result must be combined with clinical observations, patient history, and epidemiological information.  Fact Sheet for Patients:   RoadLapTop.co.za  Fact Sheet for Healthcare Providers: http://kim-miller.com/  This test is not yet approved or  cleared by the [place] FDA and has been authorized for detection and/or diagnosis of SARS-CoV-2 by FDA under an Emergency Use Authorization (EUA).  This EUA will remain in effect (meaning this test can be used) for the duration of the COVID-19 declaration under Section 564(b)(1) of the Act, 21 U.S.C. section 360bbb-3(b)(1), unless the authorization is terminated or revoked sooner.  Performed at [facility], [address] [place], [place] [address]   Urinalysis, w/ Reflex to Culture (Infection Suspected) -Urine, Clean Catch     Status: Abnormal   Collection Time: 10/28/22  1:40 PM  Result Value Ref Range   Specimen Source URINE, CLEAN  CATCH    Color, Urine AMBER (A) YELLOW    Comment: BIOCHEMICALS MAY BE AFFECTED BY COLOR   APPearance HAZY (A) CLEAR   Specific Gravity, Urine 1.024 1.005 - 1.030   pH 5.0 5.0 - 8.0   Glucose, UA NEGATIVE NEGATIVE mg/dL   Hgb urine dipstick  NEGATIVE NEGATIVE   Bilirubin Urine NEGATIVE NEGATIVE   Ketones, ur 5 (A) NEGATIVE mg/dL   Protein, ur 161 (A) NEGATIVE mg/dL   Nitrite NEGATIVE NEGATIVE   Leukocytes,Ua NEGATIVE NEGATIVE   RBC / HPF 0-5 0 - 5 RBC/hpf   WBC, UA 0-5 0 - 5 WBC/hpf    Comment:        Reflex urine culture not performed if WBC <=10, OR if Squamous epithelial cells >5. If Squamous epithelial cells >5 suggest recollection.    Bacteria, UA MANY (A) NONE SEEN   Squamous Epithelial / HPF 0-5 0 - 5 /HPF   Hyaline Casts, UA PRESENT     Comment: Performed at [facility], [address], [place], [place] [address]  Urine rapid drug screen (hosp performed)     Status: None   Collection Time: 10/28/22  1:40 PM  Result Value Ref Range   Opiates NONE DETECTED NONE DETECTED   Cocaine NONE DETECTED NONE DETECTED   Benzodiazepines NONE DETECTED NONE DETECTED   Amphetamines NONE DETECTED NONE DETECTED   Tetrahydrocannabinol NONE DETECTED NONE DETECTED   Barbiturates NONE DETECTED NONE DETECTED    Comment: (NOTE) DRUG SCREEN FOR MEDICAL PURPOSES ONLY.  IF CONFIRMATION IS NEEDED FOR ANY PURPOSE, NOTIFY LAB WITHIN 5 DAYS.  LOWEST DETECTABLE LIMITS FOR URINE DRUG SCREEN Drug Class                     Cutoff (ng/mL) Amphetamine and metabolites    1000 Barbiturate and metabolites    200 Benzodiazepine                 200 Opiates and metabolites        300 Cocaine and metabolites        300 THC                            50 Performed at [facility], [address], [place], [place] [address]    VAS [place] LOWER EXTREMITY VENOUS (DVT) (7a-7p)  Result Date: 10/28/2022  Lower Venous DVT Study Patient Name:  HILA BOLDING  Date of Exam:   10/28/2022 Medical Rec #: [id number]                Accession #:    [id number] Date of Birth: 10/14/1940                Patient Gender: F Patient Age:   17 years Exam Location:  [facility] Procedure:      VAS [place] LOWER EXTREMITY  VENOUS (DVT) Referring Phys: Lorin Picket [place] --------------------------------------------------------------------------------  Indications: Swelling, and Pain.  Limitations: Body habitus, poor ultrasound/tissue interface and inability to tolerate compressions. Performing Technologist: McKayla Maag RVT, [place]  Examination Guidelines: A complete evaluation includes B-mode imaging, spectral Doppler, color Doppler, and power Doppler as needed of all accessible portions of each vessel. Bilateral testing is considered an integral part of a complete examination. Limited examinations for reoccurring indications may be performed as noted. The reflux portion of the exam is performed with the patient in reverse Trendelenburg.  +-----+---------------+---------+-----------+----------+-----------------------+ RIGHTCompressibilityPhasicitySpontaneityPropertiesThrombus Aging          +-----+---------------+---------+-----------+----------+-----------------------+  CFV                 Yes      Yes                  Patent by color doppler +-----+---------------+---------+-----------+----------+-----------------------+ SFJ                 Yes      Yes                  Patent by color doppler +-----+---------------+---------+-----------+----------+-----------------------+   +--------+---------------+---------+-----------+----------+--------------------+ LEFT    CompressibilityPhasicitySpontaneityPropertiesThrombus Aging       +--------+---------------+---------+-----------+----------+--------------------+ CFV                    Yes      Yes                  Patent by color                                                           doppler              +--------+---------------+---------+-----------+----------+--------------------+ SFJ                    Yes      Yes                  Patent by color                                                           doppler               +--------+---------------+---------+-----------+----------+--------------------+ FV Prox Full                                                              +--------+---------------+---------+-----------+----------+--------------------+ FV Mid  Full           Yes      Yes                                       +--------+---------------+---------+-----------+----------+--------------------+ FV                     Yes      Yes                  Patent by color      Distal                                               doppler              +--------+---------------+---------+-----------+----------+--------------------+ PFV     Full                                                              +--------+---------------+---------+-----------+----------+--------------------+  POP                    Yes      Yes                  Patent by color                                                           doppler              +--------+---------------+---------+-----------+----------+--------------------+ PTV                    Yes      Yes                  Patent by color                                                           doppler              +--------+---------------+---------+-----------+----------+--------------------+ PERO                   Yes      Yes                  Patent by color                                                           doppler              +--------+---------------+---------+-----------+----------+--------------------+    Summary: RIGHT: - No evidence of common femoral vein obstruction.  LEFT: - There is no evidence of deep vein thrombosis in the lower extremity. However, portions of this examination were limited- see technologist comments above.  - No cystic structure found in the popliteal fossa.  *See table(s) above for measurements and observations.    Preliminary    DG Femur Min 2 Views Left  Result Date:  10/28/2022 CLINICAL DATA:  thigh pain EXAM: LEFT FEMUR 2 VIEWS COMPARISON:  June 26, 2018 FINDINGS: Osteopenia. No acute fracture or dislocation. Compartmental degenerative changes most pronounced in the medial compartment. Femoroacetabular joint is obscured by overlapping soft tissues. No area of erosion or osseous destruction. No unexpected radiopaque foreign body. Soft tissues are unremarkable. IMPRESSION: 1. No acute fracture or dislocation. If there is a persistent clinical concern for nondisplaced hip or pelvic fracture, recommend dedicated pelvic CT or MRI. Electronically Signed   By: Meda Klinefelter M.D.   On: 10/28/2022 13:24   DG Chest 2 View  Result Date: 10/28/2022 CLINICAL DATA:  82 year old female with altered mental status, hallucination, recent lung cancer diagnosis. Generalized pain. EXAM: CHEST - 2 VIEW COMPARISON:  CT cervical spine 09/12/2022. Chest radiographs 09/12/2022 and earlier. FINDINGS: Semi upright AP and lateral views of the chest at 1008 hours. Right upper lobe lung nodule is visible (arrows). Mediastinal contours remain normal. Lung volumes are stable  since 2021. No pneumothorax, pulmonary edema, pleural effusion or other confluent lung opacity. No acute osseous abnormality identified. Negative visible bowel gas. IMPRESSION: 1. Suspicious right upper lobe lung nodule as demonstrated on Cervical CT 09/12/2022. 2. No new cardiopulmonary abnormality. Electronically Signed   By: Odessa Fleming M.D.   On: 10/28/2022 10:45   CT Head Wo Contrast  Result Date: 10/28/2022 CLINICAL DATA:  82 year old female with altered mental status, hallucination, recent lung cancer diagnosis. Generalized pain. EXAM: CT HEAD WITHOUT CONTRAST TECHNIQUE: Contiguous axial images were obtained from the base of the skull through the vertex without intravenous contrast. RADIATION DOSE REDUCTION: This exam was performed according to the departmental dose-optimization program which includes automated exposure  control, adjustment of the mA and/or kV according to patient size and/or use of iterative reconstruction technique. COMPARISON:  Head CT 09/12/2022.  Brain MRI 04/20/2019. FINDINGS: Brain: Cerebral volume is within normal limits for age. No midline shift, ventriculomegaly, mass effect, evidence of mass lesion, intracranial hemorrhage or evidence of cortically based acute infarction. Largely normal for age gray-white differentiation, stable since May including mild chronic right basal ganglia heterogeneity. Vascular: No suspicious intracranial vascular hyperdensity. Calcified atherosclerosis at the skull base. Skull: Hyperostosis.  No acute or suspicious osseous lesion. Sinuses/Orbits: Visualized paranasal sinuses and mastoids are stable and well aerated. Other: Visualized orbits and scalp soft tissues are within normal limits. IMPRESSION: No acute intracranial abnormality. Stable and largely negative for age noncontrast CT appearance of the brain. Electronically Signed   By: Odessa Fleming M.D.   On: 10/28/2022 10:43    Pending Labs Unresulted Labs (From admission, onward)     Start     Ordered   10/28/22 1516  Urine Culture  Once,   URGENT       Question:  Indication  Answer:  Altered mental status (if no other cause identified)   10/28/22 1516   10/28/22 0951  Ethanol  Once,   URGENT        10/28/22 0950            Vitals/Pain Today's Vitals   10/28/22 1235 10/28/22 1255 10/28/22 1314 10/28/22 1340  BP: (!) 157/89     Pulse: 83 79    Resp: 19 20    Temp:   97.9 F (36.6 C)   TempSrc:   Oral   SpO2: 98% 95%    Weight:      Height:      PainSc:    Asleep    Isolation Precautions No active isolations  Medications Medications  cefTRIAXone (ROCEPHIN) 1 g in sodium chloride 0.9 % 100 mL IVPB (1 g Intravenous New Bag/Given 10/28/22 1523)  lactated ringers bolus 500 mL (0 mLs Intravenous Stopped 10/28/22 1147)  fentaNYL (SUBLIMAZE) injection 50 mcg (50 mcg Intravenous Given 10/28/22 1235)   fentaNYL (SUBLIMAZE) injection 50 mcg (50 mcg Intravenous Given 10/28/22 1258)    Mobility non-ambulatory

## 2022-10-28 NOTE — Progress Notes (Addendum)
Patient c/o pain 7-9/10 generalized. Only tylenol ordered for pain. Notified. Liana Crocker, NP. New order for one-time dose IV toradol and extra strength tylenol.   2121 - holding off giving pain medication d/t patient falling asleep right after we move her or communicate with her. Liana Crocker, NP aware.   6578 - patient still too sleepy to give pain medication. Will continue to monitor.
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
## 2022-10-28 NOTE — Progress Notes (Signed)
Left lower extremity venous study completed.   Preliminary results relayed to Md in ER.  Please see CV Procedures for preliminary results.  Evgenia Merriman, RVT  1:08 PM 10/28/22

## 2022-10-28 NOTE — ED Notes (Signed)
Ultrasound is in with pt at this time.

## 2022-10-28 NOTE — ED Provider Notes (Signed)
[place] EMERGENCY DEPARTMENT AT [facility] Provider Note   CSN: [id number] Arrival date & time: 10/28/22  1478     History  Chief Complaint  Patient presents with   Pain   Altered Mental Status    Maine is a 82 y.o. female.  HPI 82 year old female presents with hallucinations.  History is from the patient but also from the daughter over the phone.  Over the last 3-4 days, patient states she has had more people in and out of her house that she thinks are friends of her children.  However the nurse that checks in on her and her daughter have told her that these people are not really there.  She feels frustrated as no one is believing her.  She is also seeing animals outside that family also states is not there.  She endorses about 3 or more months of left thigh pain and swelling since having a vascular procedure.  She is also having diffuse bodyaches.  She has had a cough for about a week.  Also spoke to the daughter over the phone, for the last 3 to 4 days she has been having hallucinations.  This morning she called the daughter at 3 AM to tell her there were a lot of people in her house but when she got there no one was there.  She also been having leg pain over the last 4 days or so and both feet and legs are swollen despite taking fluid pills.  She has a history of CHF.   Home Medications Prior to Admission medications   Medication Sig Start Date End Date Taking? Authorizing Provider  albuterol (PROVENTIL HFA) 108 (90 Base) MCG/ACT inhaler Inhale 2 puffs into the lungs every 4 (four) hours as needed for wheezing or shortness of breath. 06/12/17   Shon Hale, MD  amLODipine (NORVASC) 5 MG tablet Take 5 mg by mouth daily.    [provider]  aspirin EC 81 MG tablet Take 81 mg by mouth daily.    [provider]  Aspirin-Salicylamide-Caffeine (BC HEADACHE POWDER PO) Take 2 packets by mouth every evening.    [provider]   atorvastatin (LIPITOR) 40 MG tablet Take 1 tablet (40 mg total) by mouth daily. 04/24/19 05/14/22  Garnette Gunner, MD  cephALEXin (KEFLEX) 500 MG capsule Take 500 mg by mouth 4 (four) times daily.    [provider]  clopidogrel (PLAVIX) 75 MG tablet Take 1 tablet (75 mg total) by mouth daily. 05/14/22   Chuck Hint, MD  diclofenac Sodium (VOLTAREN) 1 % GEL Apply 2 g topically 4 (four) times daily. Patient taking differently: Apply 2 g topically daily as needed (pain). 04/02/20   Gailen Shelter, PA  furosemide (LASIX) 20 MG tablet Take 40 mg by mouth daily.    [provider]  gabapentin (NEURONTIN) 300 MG capsule TAKE 1 CAPSULE(300 MG) BY MOUTH TWICE DAILY Patient taking differently: Take 300 mg by mouth 2 (two) times daily. 05/09/18   Romero Belling, MD  gentamicin ointment (GARAMYCIN) 0.1 % Apply 1 Application topically daily. 04/20/22   McDonald, Rachelle Hora, DPM  glucose blood (ONETOUCH VERIO) test strip 1 each by Other route 2 (two) times daily. And lancets 2/day 07/20/17   Romero Belling, MD  Hyprom-Naphaz-Polysorb-Zn Sulf (CLEAR EYES COMPLETE OP) Place 2 drops into both eyes daily.    [provider]  ibuprofen (ADVIL) 200 MG tablet Take 400 mg by mouth every 6 (six) hours  as needed for mild pain or moderate pain (With food).    [provider]  ipratropium-albuterol (DUONEB) 0.5-2.5 (3) MG/3ML SOLN Take 3 mLs by nebulization every 6 (six) hours as needed. Patient taking differently: Take 3 mLs by nebulization every 6 (six) hours as needed (Shortness of breath). 06/12/17   Shon Hale, MD  LANTUS SOLOSTAR 100 UNIT/ML Solostar Pen SMARTSIG:20 Unit(s) SUB-Q Every Morning 08/17/22   [provider]  LEVEMIR FLEXPEN 100 UNIT/ML FlexPen Inject 100 Units into the skin 2 (two) times daily. 04/22/22   [provider]  levETIRAcetam (KEPPRA) 500 MG tablet Take 1 tablet (500 mg total) by mouth 2 (two) times daily. 04/24/19 05/13/22  Garnette Gunner, MD  polyethylene glycol [facility] / Ethelene Hal) packet Take 17 g by mouth 2 (two) times daily. Patient taking differently: Take 17 g by mouth daily. Orange juice 06/12/17   Emokpae, Courage, MD  potassium chloride SA (KLOR-CON M) 20 MEQ tablet Take 20 mEq by mouth 2 (two) times daily.    [provider]  silver sulfADIAZINE (SILVADENE) 1 % cream Apply 1 application topically daily. 01/27/21   Louann Sjogren, DPM  traMADol (ULTRAM-ER) 100 MG 24 hr tablet Take 100 mg by mouth daily as needed for pain.    [provider]  triamcinolone cream (KENALOG) 0.1 % Apply 1 application  topically daily as needed (Apply to rash on face). 08/14/18   [provider]  promethazine (PHENERGAN) 25 MG tablet Take 1 tablet (25 mg total) by mouth every 6 (six) hours as needed for nausea. 06/25/15 06/25/15  Derwood Kaplan, MD      Allergies    Hydrocodone, Lisinopril, Pioglitazone, and Varenicline tartrate    Review of Systems   Review of Systems  Constitutional:  Negative for fever.  Respiratory:  Positive for cough.   Gastrointestinal:  Negative for abdominal pain.  Genitourinary:  Negative for dysuria.  Musculoskeletal:  Positive for myalgias.  Neurological:  Positive for headaches.    Physical Exam Updated Vital Signs BP (!) 157/89   Pulse 79   Temp 97.9 F (36.6 C) (Oral)   Resp 20   Ht 5\' 6"  (1.676 m)   Wt 109 kg   SpO2 95%   BMI 38.79 kg/m  Physical Exam Vitals and nursing note reviewed.  Constitutional:      Appearance: She is well-developed. She is obese. She is not ill-appearing or diaphoretic.  HENT:     Head: Normocephalic and atraumatic.     Mouth/Throat:     Mouth: Mucous membranes are dry.  Eyes:     Extraocular Movements: Extraocular movements intact.     Pupils: Pupils are equal, round, and reactive to light.  Cardiovascular:     Rate and Rhythm: Normal rate and regular rhythm.     Pulses:          Dorsalis pedis pulses are detected w/ Doppler on the  right side and detected w/ Doppler on the left side.     Heart sounds: Normal heart sounds.  Pulmonary:     Effort: Pulmonary effort is normal.     Comments: Coarse breath sounds bilaterally Abdominal:     Palpations: Abdomen is soft.     Tenderness: There is no abdominal tenderness.  Musculoskeletal:     Comments: No significant tenderness or bruising in her extremities except for tenderness in the left thigh. The left thigh does seems swollen compared to right. No erythema or fluctuance. It is diffusely tender.  Both  feel appear chronically swollen.  Skin:    General: Skin is warm and dry.  Neurological:     Mental Status: She is alert.     Comments: She is alert and oriented to person. She knows it's July [birth year], though at first notes her birth year and eventually gets to 2023. However, [place]. No slurred speech or facial droop. Symmetric but diffuse weakness in all 4 extremities     ED Results / Procedures / Treatments   Labs (all labs ordered are listed, but only abnormal results are displayed) Labs Reviewed  URINALYSIS, W/ REFLEX TO CULTURE (INFECTION SUSPECTED) - Abnormal; Notable for the following components:      Result Value   Color, Urine AMBER (*)    APPearance HAZY (*)    Ketones, ur 5 (*)    Protein, ur 100 (*)    Bacteria, UA MANY (*)    All other components within normal limits  COMPREHENSIVE METABOLIC PANEL - Abnormal; Notable for the following components:   Glucose, Bld 155 (*)    Calcium 8.7 (*)    Albumin 3.2 (*)    AST 12 (*)    All other components within normal limits  CBC WITH DIFFERENTIAL/PLATELET - Abnormal; Notable for the following components:   RBC 3.73 (*)    Hemoglobin 11.2 (*)    All other components within normal limits  SARS CORONAVIRUS 2 BY RT PCR  URINE CULTURE  AMMONIA  LACTIC ACID, PLASMA  CK  RAPID URINE DRUG SCREEN, HOSP PERFORMED  PROTIME-INR  BRAIN NATRIURETIC PEPTIDE   ETHANOL  CBG MONITORING, ED  TROPONIN I (HIGH SENSITIVITY)    EKG EKG Interpretation Date/Time:  Thursday October 28 2022 09:32:59 EDT Ventricular Rate:  82 PR Interval:  178 QRS Duration:  116 QT Interval:  411 QTC Calculation: 480 R Axis:   62  Text Interpretation: Sinus rhythm Nonspecific intraventricular conduction delay Low voltage, precordial leads Similar to May 2024 tracing Confirmed by Alona Bene [phone]) on 10/28/2022 10:40:14 AM  Radiology VAS [place] LOWER EXTREMITY VENOUS (DVT) (7a-7p)  Result Date: 10/28/2022  Lower Venous DVT Study Patient Name:  TEARA DUERKSEN Estis  Date of Exam:   10/28/2022 Medical Rec #: [id number]                Accession #:    [id number] Date of Birth: 29-Oct-1940                Patient Gender: F Patient Age:   28 years Exam Location:  [facility] Procedure:      VAS [place] LOWER EXTREMITY VENOUS (DVT) Referring Phys: Lorin Picket Ugochukwu Chichester --------------------------------------------------------------------------------  Indications: Swelling, and Pain.  Limitations: Body habitus, poor ultrasound/tissue interface and inability to tolerate compressions. Performing Technologist: McKayla Maag RVT, VT  Examination Guidelines: A complete evaluation includes B-mode imaging, spectral Doppler, color Doppler, and power Doppler as needed of all accessible portions of each vessel. Bilateral testing is considered an integral part of a complete examination. Limited examinations for reoccurring indications may be performed as noted. The reflux portion of the exam is performed with the patient in reverse Trendelenburg.  +-----+---------------+---------+-----------+----------+-----------------------+ RIGHTCompressibilityPhasicitySpontaneityPropertiesThrombus Aging          +-----+---------------+---------+-----------+----------+-----------------------+ CFV                 Yes      Yes                  Patent  by color doppler  +-----+---------------+---------+-----------+----------+-----------------------+ SFJ                 Yes      Yes                  Patent by color doppler +-----+---------------+---------+-----------+----------+-----------------------+   +--------+---------------+---------+-----------+----------+--------------------+ LEFT    CompressibilityPhasicitySpontaneityPropertiesThrombus Aging       +--------+---------------+---------+-----------+----------+--------------------+ CFV                    Yes      Yes                  Patent by color                                                           doppler              +--------+---------------+---------+-----------+----------+--------------------+ SFJ                    Yes      Yes                  Patent by color                                                           doppler              +--------+---------------+---------+-----------+----------+--------------------+ FV Prox Full                                                              +--------+---------------+---------+-----------+----------+--------------------+ FV Mid  Full           Yes      Yes                                       +--------+---------------+---------+-----------+----------+--------------------+ FV                     Yes      Yes                  Patent by color      Distal                                               doppler              +--------+---------------+---------+-----------+----------+--------------------+ PFV     Full                                                              +--------+---------------+---------+-----------+----------+--------------------+  POP                    Yes      Yes                  Patent by color                                                           doppler              +--------+---------------+---------+-----------+----------+--------------------+  PTV                    Yes      Yes                  Patent by color                                                           doppler              +--------+---------------+---------+-----------+----------+--------------------+ PERO                   Yes      Yes                  Patent by color                                                           doppler              +--------+---------------+---------+-----------+----------+--------------------+     Summary: RIGHT: - No evidence of common femoral vein obstruction.  LEFT: - There is no evidence of deep vein thrombosis in the lower extremity. However, portions of this examination were limited- see technologist comments above.  - No cystic structure found in the popliteal fossa.  *See table(s) above for measurements and observations. Electronically signed by Sherald Hess MD on 10/28/2022 at 3:47:32 PM.    Final    DG Femur Min 2 Views Left  Result Date: 10/28/2022 CLINICAL DATA:  thigh pain EXAM: LEFT FEMUR 2 VIEWS COMPARISON:  June 26, 2018 FINDINGS: Osteopenia. No acute fracture or dislocation. Compartmental degenerative changes most pronounced in the medial compartment. Femoroacetabular joint is obscured by overlapping soft tissues. No area of erosion or osseous destruction. No unexpected radiopaque foreign body. Soft tissues are unremarkable. IMPRESSION: 1. No acute fracture or dislocation. If there is a persistent clinical concern for nondisplaced hip or pelvic fracture, recommend dedicated pelvic CT or MRI. Electronically Signed   By: Meda Klinefelter M.D.   On: 10/28/2022 13:24   DG Chest 2 View  Result Date: 10/28/2022 CLINICAL DATA:  82 year old female with altered mental status, hallucination, recent lung cancer diagnosis. Generalized pain. EXAM: CHEST - 2 VIEW COMPARISON:  CT cervical spine 09/12/2022. Chest radiographs 09/12/2022 and earlier. FINDINGS: Semi upright AP and lateral views of the chest at 1008  hours. Right upper lobe  lung nodule is visible (arrows). Mediastinal contours remain normal. Lung volumes are stable since 2021. No pneumothorax, pulmonary edema, pleural effusion or other confluent lung opacity. No acute osseous abnormality identified. Negative visible bowel gas. IMPRESSION: 1. Suspicious right upper lobe lung nodule as demonstrated on Cervical CT 09/12/2022. 2. No new cardiopulmonary abnormality. Electronically Signed   By: Odessa Fleming M.D.   On: 10/28/2022 10:45   CT Head Wo Contrast  Result Date: 10/28/2022 CLINICAL DATA:  82 year old female with altered mental status, hallucination, recent lung cancer diagnosis. Generalized pain. EXAM: CT HEAD WITHOUT CONTRAST TECHNIQUE: Contiguous axial images were obtained from the base of the skull through the vertex without intravenous contrast. RADIATION DOSE REDUCTION: This exam was performed according to the departmental dose-optimization program which includes automated exposure control, adjustment of the mA and/or kV according to patient size and/or use of iterative reconstruction technique. COMPARISON:  Head CT 09/12/2022.  Brain MRI 04/20/2019. FINDINGS: Brain: Cerebral volume is within normal limits for age. No midline shift, ventriculomegaly, mass effect, evidence of mass lesion, intracranial hemorrhage or evidence of cortically based acute infarction. Largely normal for age gray-white differentiation, stable since May including mild chronic right basal ganglia heterogeneity. Vascular: No suspicious intracranial vascular hyperdensity. Calcified atherosclerosis at the skull base. Skull: Hyperostosis.  No acute or suspicious osseous lesion. Sinuses/Orbits: Visualized paranasal sinuses and mastoids are stable and well aerated. Other: Visualized orbits and scalp soft tissues are within normal limits. IMPRESSION: No acute intracranial abnormality. Stable and largely negative for age noncontrast CT appearance of the brain. Electronically Signed   By: Odessa Fleming M.D.   On: 10/28/2022 10:43    Procedures Procedures    Medications Ordered in ED Medications  cefTRIAXone (ROCEPHIN) 1 g in sodium chloride 0.9 % 100 mL IVPB (1 g Intravenous New Bag/Given 10/28/22 1523)  lactated ringers bolus 500 mL (0 mLs Intravenous Stopped 10/28/22 1147)  fentaNYL (SUBLIMAZE) injection 50 mcg (50 mcg Intravenous Given 10/28/22 1235)  fentaNYL (SUBLIMAZE) injection 50 mcg (50 mcg Intravenous Given 10/28/22 1258)    ED Course/ Medical Decision Making/ A&P                             Medical Decision Making Amount and/or Complexity of Data Reviewed Labs: ordered.    Details: Urine has many bacteria, but no leukocytes.  WBC and lactate are normal. Radiology: ordered and independent interpretation performed.    Details: No head bleed.  No fracture on the femur x-ray. ECG/medicine tests: ordered and independent interpretation performed.    Details: No ischemia  Risk Prescription drug management. Decision regarding hospitalization.   Patient presents with hallucinations.  Seems to also be a little generally confused.  Workup so far is pretty unremarkable including negative head CT, no significant electrolyte disturbance or obvious leukocytosis/infectious cause.  However on a catheterized urine, which was obtained because she could not urinate with 300+ cc of urine in her bladder, shows many bacteria without leukocytes.  On discussion with Dr. Robb Matar, will start on antibiotics as this could be a source.  Given that she is having hallucinations this could be a psychiatric cause but she has never had any significant psychiatric diagnoses in the past.  I think she will need further medical workup.  No focal deficits.  She was given IV fentanyl as she continued to have significant leg pain but she has a good pulse on Doppler and no obvious sign of an infection.  X-ray is unremarkable and DVT study is negative.  Unclear why this has been hurting but chart review indicates  it seems like it has been a while.  Doubt deep space infection.  Overall I think she will need admission and I discussed with Dr. Robb Matar for admission.        Final Clinical Impression(s) / ED Diagnoses Final diagnoses:  Altered mental status, unspecified altered mental status type    Rx / DC Orders ED Discharge Orders     None         Pricilla Loveless, MD 10/28/22 1555

## 2022-10-28 NOTE — ED Triage Notes (Signed)
Pt arrives via EMS from home with reports of hallucinations and generalized pain x2 weeks. Recent lung CA diagnosis. Pt unable to tell me where she is and just says she hurts all over. States her son lives with her.

## 2022-10-29 ENCOUNTER — Observation Stay (HOSPITAL_COMMUNITY): Payer: Medicare PPO

## 2022-10-29 ENCOUNTER — Inpatient Hospital Stay (HOSPITAL_COMMUNITY)
Admit: 2022-10-29 | Discharge: 2022-10-29 | Disposition: A | Discharge status: Home / Self Care | Payer: Medicare PPO | Attending: Internal Medicine | Admitting: Internal Medicine

## 2022-10-29 DIAGNOSIS — G4733 Obstructive sleep apnea (adult) (pediatric): Secondary | ICD-10-CM | POA: Diagnosis present

## 2022-10-29 DIAGNOSIS — E669 Obesity, unspecified: Secondary | ICD-10-CM | POA: Diagnosis present

## 2022-10-29 DIAGNOSIS — I69351 Hemiplegia and hemiparesis following cerebral infarction affecting right dominant side: Secondary | ICD-10-CM | POA: Diagnosis not present

## 2022-10-29 DIAGNOSIS — J440 Chronic obstructive pulmonary disease with acute lower respiratory infection: Secondary | ICD-10-CM | POA: Diagnosis not present

## 2022-10-29 DIAGNOSIS — B9689 Other specified bacterial agents as the cause of diseases classified elsewhere: Secondary | ICD-10-CM | POA: Diagnosis present

## 2022-10-29 DIAGNOSIS — R4182 Altered mental status, unspecified: Secondary | ICD-10-CM

## 2022-10-29 DIAGNOSIS — G9341 Metabolic encephalopathy: Secondary | ICD-10-CM | POA: Diagnosis present

## 2022-10-29 DIAGNOSIS — J4489 Other specified chronic obstructive pulmonary disease: Secondary | ICD-10-CM | POA: Diagnosis present

## 2022-10-29 DIAGNOSIS — Z66 Do not resuscitate: Secondary | ICD-10-CM | POA: Diagnosis present

## 2022-10-29 DIAGNOSIS — G4730 Sleep apnea, unspecified: Secondary | ICD-10-CM | POA: Diagnosis not present

## 2022-10-29 DIAGNOSIS — G40909 Epilepsy, unspecified, not intractable, without status epilepticus: Secondary | ICD-10-CM | POA: Diagnosis present

## 2022-10-29 DIAGNOSIS — R569 Unspecified convulsions: Secondary | ICD-10-CM | POA: Diagnosis not present

## 2022-10-29 DIAGNOSIS — E1142 Type 2 diabetes mellitus with diabetic polyneuropathy: Secondary | ICD-10-CM | POA: Diagnosis present

## 2022-10-29 DIAGNOSIS — E7800 Pure hypercholesterolemia, unspecified: Secondary | ICD-10-CM | POA: Diagnosis present

## 2022-10-29 DIAGNOSIS — I110 Hypertensive heart disease with heart failure: Secondary | ICD-10-CM | POA: Diagnosis present

## 2022-10-29 DIAGNOSIS — R338 Other retention of urine: Secondary | ICD-10-CM | POA: Diagnosis not present

## 2022-10-29 DIAGNOSIS — J9622 Acute and chronic respiratory failure with hypercapnia: Secondary | ICD-10-CM | POA: Diagnosis present

## 2022-10-29 DIAGNOSIS — R9431 Abnormal electrocardiogram [ECG] [EKG]: Secondary | ICD-10-CM | POA: Diagnosis not present

## 2022-10-29 DIAGNOSIS — Z1152 Encounter for screening for COVID-19: Secondary | ICD-10-CM | POA: Diagnosis not present

## 2022-10-29 DIAGNOSIS — J9602 Acute respiratory failure with hypercapnia: Secondary | ICD-10-CM | POA: Diagnosis not present

## 2022-10-29 DIAGNOSIS — J9621 Acute and chronic respiratory failure with hypoxia: Secondary | ICD-10-CM | POA: Diagnosis present

## 2022-10-29 DIAGNOSIS — F32A Depression, unspecified: Secondary | ICD-10-CM | POA: Diagnosis present

## 2022-10-29 DIAGNOSIS — I5032 Chronic diastolic (congestive) heart failure: Secondary | ICD-10-CM | POA: Diagnosis present

## 2022-10-29 DIAGNOSIS — R911 Solitary pulmonary nodule: Secondary | ICD-10-CM | POA: Diagnosis not present

## 2022-10-29 DIAGNOSIS — Z6838 Body mass index (BMI) 38.0-38.9, adult: Secondary | ICD-10-CM | POA: Diagnosis not present

## 2022-10-29 DIAGNOSIS — J9601 Acute respiratory failure with hypoxia: Secondary | ICD-10-CM | POA: Diagnosis not present

## 2022-10-29 DIAGNOSIS — E1143 Type 2 diabetes mellitus with diabetic autonomic (poly)neuropathy: Secondary | ICD-10-CM | POA: Diagnosis present

## 2022-10-29 DIAGNOSIS — N3000 Acute cystitis without hematuria: Secondary | ICD-10-CM | POA: Diagnosis present

## 2022-10-29 DIAGNOSIS — K7460 Unspecified cirrhosis of liver: Secondary | ICD-10-CM | POA: Diagnosis present

## 2022-10-29 DIAGNOSIS — I2510 Atherosclerotic heart disease of native coronary artery without angina pectoris: Secondary | ICD-10-CM | POA: Diagnosis present

## 2022-10-29 DIAGNOSIS — F17210 Nicotine dependence, cigarettes, uncomplicated: Secondary | ICD-10-CM | POA: Diagnosis present

## 2022-10-29 DIAGNOSIS — K3184 Gastroparesis: Secondary | ICD-10-CM | POA: Diagnosis present

## 2022-10-29 DIAGNOSIS — Z794 Long term (current) use of insulin: Secondary | ICD-10-CM | POA: Diagnosis not present

## 2022-10-29 LAB — GLUCOSE, CAPILLARY
Glucose-Capillary: 98 mg/dL (ref 70–99)
Glucose-Capillary: 90 mg/dL (ref 70–99)
Glucose-Capillary: 78 mg/dL (ref 70–99)
Glucose-Capillary: 62 mg/dL — ABNORMAL LOW (ref 70–99)
Glucose-Capillary: 127 mg/dL — ABNORMAL HIGH (ref 70–99)

## 2022-10-29 LAB — COMPREHENSIVE METABOLIC PANEL
ALT: 7 U/L (ref 0–44)
AST: 11 U/L — ABNORMAL LOW (ref 15–41)
Albumin: 2.5 g/dL — ABNORMAL LOW (ref 3.5–5.0)
Alkaline Phosphatase: 88 U/L (ref 38–126)
Anion gap: 6 (ref 5–15)
BUN: 17 mg/dL (ref 8–23)
CO2: 29 mmol/L (ref 22–32)
Calcium: 8 mg/dL — ABNORMAL LOW (ref 8.9–10.3)
Chloride: 102 mmol/L (ref 98–111)
Creatinine, Ser: 0.79 mg/dL (ref 0.44–1.00)
GFR, Estimated: >60 mL/min (ref >60)
Glucose, Bld: 122 mg/dL — ABNORMAL HIGH (ref 70–99)
Potassium: 3.7 mmol/L (ref 3.5–5.1)
Sodium: 137 mmol/L (ref 135–145)
Total Bilirubin: 0.5 mg/dL (ref 0.3–1.2)
Total Protein: 5.6 g/dL — ABNORMAL LOW (ref 6.5–8.1)

## 2022-10-29 LAB — BLOOD GAS, VENOUS
Acid-Base Excess: 3.2 mmol/L — ABNORMAL HIGH (ref 0.0–2.0)
Bicarbonate: 31.5 mmol/L — ABNORMAL HIGH (ref 20.0–28.0)
Drawn by: 69272
O2 Saturation: 83.5 %
Patient temperature: 37.1
pCO2, Ven: 64 mmHg — ABNORMAL HIGH (ref 44–60)
pH, Ven: 7.3 (ref 7.25–7.43)
pO2, Ven: 52 mmHg — ABNORMAL HIGH (ref 32–45)

## 2022-10-29 LAB — URINE CULTURE: Culture: >=100000 — AB

## 2022-10-29 LAB — ECHOCARDIOGRAM COMPLETE
AR max vel: 1.78 cm2
AV Area VTI: 1.85 cm2
AV Area mean vel: 1.8 cm2
AV Mean grad: 3 mmHg
AV Peak grad: 6.5 mmHg
Ao pk vel: 1.27 m/s
Area-P 1/2: 2.8 cm2
Height: 66 in
S' Lateral: 3 cm
Weight: 3844.82 oz

## 2022-10-29 LAB — CBC
HCT: 33.9 % — ABNORMAL LOW (ref 36.0–46.0)
Hemoglobin: 10.5 g/dL — ABNORMAL LOW (ref 12.0–15.0)
MCH: 30.3 pg (ref 26.0–34.0)
MCHC: 31 g/dL (ref 30.0–36.0)
MCV: 98 fL (ref 80.0–100.0)
Platelets: 224 10*3/uL (ref 150–400)
RBC: 3.46 MIL/uL — ABNORMAL LOW (ref 3.87–5.11)
RDW: 13.8 % (ref 11.5–15.5)
WBC: 5.5 10*3/uL (ref 4.0–10.5)
nRBC: 0 % (ref 0.0–0.2)

## 2022-10-29 LAB — MRSA NEXT GEN BY PCR, NASAL: MRSA by PCR Next Gen: NOT DETECTED

## 2022-10-29 MED ORDER — LEVETIRACETAM IN NACL 500 MG/100ML IV SOLN
500.0000 mg | Freq: Two times a day (BID) | INTRAVENOUS | Status: DC
  Administered 2022-10-29 – 2022-10-31 (×5): 500 mg via INTRAVENOUS
  Filled 2022-10-29 (×5): qty 100

## 2022-10-29 MED ORDER — DEXTROSE 50 % IV SOLN
12.5000 g | INTRAVENOUS | Status: AC
  Administered 2022-10-29: 12.5 g via INTRAVENOUS
  Filled 2022-10-29: qty 50

## 2022-10-29 MED ORDER — SODIUM CHLORIDE 0.9 % IV SOLN: INTRAVENOUS | Status: DC | PRN

## 2022-10-29 MED ORDER — LIDOCAINE 5 % EX PTCH
1.0000 | MEDICATED_PATCH | CUTANEOUS | Status: AC
  Administered 2022-10-29: 1 via TRANSDERMAL
  Filled 2022-10-29: qty 1

## 2022-10-29 MED ORDER — KETOROLAC TROMETHAMINE 15 MG/ML IJ SOLN
15.0000 mg | Freq: Four times a day (QID) | INTRAMUSCULAR | Status: AC | PRN
  Administered 2022-10-29: 15 mg via INTRAVENOUS
  Filled 2022-10-29: qty 1

## 2022-10-29 MED ORDER — CHLORHEXIDINE GLUCONATE CLOTH 2 % EX PADS
6.0000 | MEDICATED_PAD | Freq: Every day | CUTANEOUS | Status: DC
  Administered 2022-10-29 – 2022-11-03 (×6): 6 via TOPICAL

## 2022-10-29 MED ORDER — SODIUM CHLORIDE 0.9 % IV SOLN
2.0000 g | INTRAVENOUS | Status: AC
  Administered 2022-10-29 – 2022-11-03 (×6): 2 g via INTRAVENOUS
  Filled 2022-10-29 (×6): qty 20

## 2022-10-29 NOTE — Progress Notes (Signed)
Chaplain responded to page for spiritual support of patient's family who are making decisions about patient's care. Patient's daughter's Synetta Fail and Velna Hatchet were at her bedside. I provided reflective listening, prayer at their request and a calm presence. Further support available by paging chaplain services.  Chaplain [place], [place]   10/29/22 2030  Spiritual Encounters  Type of Visit Initial  Care provided to: Pt not available;Family  Conversation partners present during encounter Nurse  Referral source Nurse (RN/NT/LPN)  Reason for visit Urgent spiritual support  OnCall Visit Yes  Spiritual Framework  Presenting Themes Goals in life/care;Values and beliefs;Significant life change;Impactful experiences and emotions  Community/Connection Family  Patient Stress Factors Health changes  Family Stress Factors Family relationships;Health changes  Interventions  Spiritual Care Interventions Made Established relationship of care and support;Compassionate presence;Reflective listening;Normalization of emotions;Prayer  Intervention Outcomes  Outcomes Connection to spiritual care;Connection to values and goals of care

## 2022-10-29 NOTE — Procedures (Signed)
Patient Name: [name]  MRN: [id number]  Epilepsy Attending: Charlsie Quest  Referring Physician/Provider: Alwyn Ren, MD  Date: 10/29/2022 Duration: 21.23 mins  Patient history: 82yo F with h/o epilepsy now with ams. EEG to evaluate for seizure  Level of alertness: Awake, asleep  AEDs during EEG study: LEV, GBP  Technical aspects: This EEG study was done with scalp electrodes positioned according to the 10-20 International system of electrode placement. Electrical activity was reviewed with band pass filter of 1-70Hz , sensitivity of 7 uV/mm, display speed of 88mm/sec with a 60Hz  notched filter applied as appropriate. EEG data were recorded continuously and digitally stored.  Video monitoring was available and reviewed as appropriate.  Description: The posterior dominant rhythm consists of 8-9Hz  activity of moderate voltage (25-35 uV) seen predominantly in posterior head regions, symmetric and reactive to eye opening and eye closing. Sleep was characterized by vertex waves, sleep spindles (12 to 14 Hz), maximal frontocentral region. There is also 15 to 18 Hz  beta activity distributed symmetrically and diffusely. Hyperventilation and photic stimulation were not performed.     IMPRESSION: This study is within normal limits. No seizures or epileptiform discharges were seen throughout the recording.  A normal interictal EEG does not exclude the diagnosis of epilepsy.  [name] [name]

## 2022-10-29 NOTE — Progress Notes (Signed)
EEG complete - results pending 

## 2022-10-29 NOTE — Progress Notes (Signed)
Patient remains lethargic, opening eyes briefly for some conversation and requesting water, then drifts back to sleep on NIV. Spoke with patient's Daughters about DNR status which also states "advanced airway interventions, mechanical ventilation or cardioversion in appropriate circumstances." Asked for clarification of wishes and if the patient's respiratory status were to deteriorate, do they wish for her to be intubated. The patient's daughters were not agreeable on goals of care. Chaplain services contacted as well as ARNP cross coverage for further discussion.

## 2022-10-29 NOTE — Plan of Care (Signed)
?  Problem: Clinical Measurements: ?Goal: Ability to maintain clinical measurements within normal limits will improve ?Outcome: Progressing ?Goal: Will remain free from infection ?Outcome: Progressing ?Goal: Diagnostic test results will improve ?Outcome: Progressing ?  ?

## 2022-10-29 NOTE — Progress Notes (Signed)
Notified MD Jerolyn Center in regards to patient being somnolent/lethargic despite physical stimuli. Patient will briefly arouse to painful stimuli but immediately become somnolent afterwards. MD Jerolyn Center ordered a VBG to be completed. PCO2 slightly elevated on VBG. MD Jerolyn Center notified and she ordered BIPAP to be placed along with SD transfer. Notified Respiratory Therapy in regards to BIPAP orders as well. Will continue to monitor and assess.

## 2022-10-29 NOTE — Progress Notes (Signed)
*  PRELIMINARY RESULTS* Echocardiogram 2D Echocardiogram has been performed.  [name] 10/29/2022, 12:33 PM

## 2022-10-29 NOTE — Progress Notes (Signed)
PROGRESS NOTE    [place]  NWG:[id number] DOB: Aug 16, 1940 DOA: 10/28/2022 PCP: System, Provider Not In   Brief Narrative: [name] is a 82 y.o. female with medical history significant of allergic rhinitis, osteoarthritis, history of CVA with residual right-sided hemiparesis, chronic diastolic heart failure, cholelithiasis, chronic back pain, liver cirrhosis, colon polyps, COPD, depression, type 2 diabetes, diverticulosis, fatty liver, history of liver cirrhosis, gastritis, gastroparesis, GERD, history of headaches, hyperlipidemia, hypertension, CAD, history of MI, diabetic neuropathy, class II obesity, OSA not on CPAP, osteoporosis, seizure disorder who was brought to the emergency department due to generalized pain and altered mental status with hallucinations for the past 4 days, but they seem to be significantly worse since yesterday.  She called her daughter around 0300 this morning stating that there were a lot of people in her house that were friends of her kids..  She is somnolent and unable to provide further information at this time.  The history is taken from her daughters (1 at bedside and the other 1 was on the phone).  She had a similar picture like this about 4 years ago that was due to with urinary tract infection.  They also stated that she has been having frequent vomiting and has lost weight recently.  She also has been following with Dr. 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   No fever, but has been having myalgias and nonproductive cough.  She has been having hallucinations.  The patient is still somnolent and unable to provide further information.  She received 50 mcg of fentanyl x 2 in the emergency department around 1300 and is still somnolent with inability to provide further information.   Lab work: Urinalysis was hazy with ketones of 5 and protein of 100 mg/dL.  Negative nitrites and leukocyte  esterase.  There were many bacteria.  The rest of the urine analysis was normal.  UDS was negative.  Her CBC showed a white count of 7.0, hemoglobin 11.2 g/dL platelets 578.  Unremarkable PT and INR.  Negative coronavirus PCR.  Lactic acid, ammonia, troponin, total CK, alcohol level and BNP were normal.  CMP with normal electrolytes after calcium correction.  AST 12 units/L, albumin 3.2 g/dL and glucose 469 mg/dL.  The rest of the LFTs and renal function were normal.   Imaging: Two-view portable chest radiograph with suspicious right upper lobe lung nodule as demonstrated on cervical CT on 09/12/2022.  Left femur x-ray with no acute fracture or dislocation.  There is persistent clinical concern for nondisplaced hip or pelvic fracture, recommend dedicated pelvic CT or MRI.  CT head without contrast with no acute intracranial abnormality.   ED course: Initial vital signs were temperature 97.9 F, pulse 79, respiration 20, BP 156/102 mmHg and O2 sat 97% on room air.  The patient received fentanyl 50 mcg IVP x 2, LR 500 mL bolus and ceftriaxone 1 g IVPB.    Assessment & Plan:   Principal Problem:   Acute metabolic encephalopathy Active Problems:   COPD   GERD   Hepatic cirrhosis (HCC)   Gastroparesis   Chronic diastolic CHF (congestive heart failure) (HCC)   Vomiting   Essential hypertension   HLD (hyperlipidemia)   Sleep disorder   Type 2 diabetes mellitus (HCC)   Epilepsy (HCC)   CAD (coronary artery disease)   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)  #1 acute metabolic encephalopathy likely due  to to multiple causes including hypercapnia ?  Seizures ?  UTI -patient was admitted with complaints of hallucination and confusion.  UA essentially appears negative for UTI.  Culture is pending.  She got 1 dose of Rocephin in the ER and was not continued later. At baseline she lives at home does not walk much her daughter lives next door and takes care of the mother. ABG  7.3/64/52 Will transfer her to stepdown and placed BiPAP Obtain EEG to rule out seizures Patient has history of obstructive sleep apnea but does not use his CPAP at home She was retaining urine with placement of Foley over 1 L.   #2 acute hypercapnic respiratory failure in the setting of COPD obstructive sleep apnea BiPAP nightly and as needed Follow-up ABG  #3 history of epilepsy on Keppra and gabapentin Check EEG to rule out active seizures  #4 acute urinary retention Foley placed in the ER  #5 history of  Cirrhosis/gastroparesis-  followed by [facility]  #6 chronic diastolic heart failure stable follow-up echo pending  #7 essential hypertension hold Lasix and Norvasc as bp soft   #8 hyperlipidemia on atorvastatin  #9 type 2 diabetes continue SSI  #10 morbid obesity complicates overall prognosis and management  #11 history of stroke with hemiparesis continue statin and aspirin and plavix   Estimated body mass index is 38.79 kg/m as calculated from the following:   Height as of this encounter: 5\' 6"  (1.676 m).   Weight as of this encounter: 109 kg.  DVT prophylaxis: Lovenox  code Status: DNR family Communication: Daughter at the side  disposition Plan:  Status is: Inpatient Consultants:  None  Procedures: None Antimicrobials: None  Subjective: She is very lethargic does not follow commands or respond to questions daughter by the bedside Has sleep apnea with COPD but does not use a CPAP at home does not have oxygen at home currently 96% on 2 L ABG this morning shows hypercapnia   Objective: Vitals:   10/28/22 2055 10/29/22 0157 10/29/22 0800 10/29/22 1021  BP: (!) 125/52 (!) 105/42  (!) 128/55  Pulse: 75 77  (!) 58  Resp: 19 20 20 20   Temp: 97.7 F (36.5 C) 98.2 F (36.8 C)  98.1 F (36.7 C)  TempSrc: Oral Oral  Oral  SpO2: 94% 92%  96%  Weight:      Height:        Intake/Output Summary (Last 24 hours) at 10/29/2022 1108 Last data filed at 10/29/2022  0900 Gross per 24 hour  Intake 892.26 ml  Output 1750 ml  Net -857.74 ml   Filed Weights   10/28/22 0926  Weight: 109 kg    Examination:  General exam: Appears chronically ill-appearing  respiratory system: Diminished to auscultation. Respiratory effort normal. Cardiovascular system: S1 & S2 heard, RRR. No JVD, murmurs, rubs, gallops or clicks. No pedal edema. Gastrointestinal system: Abdomen is distended, soft and nontender. No organomegaly or masses felt. Normal bowel sounds heard. Central nervous system: not responding to verbal or tactile stimuli Extremities: Edema 1+  Data Reviewed: I have personally reviewed following labs and imaging studies  CBC: Recent Labs  Lab 10/28/22 0957 10/29/22 0714  WBC 7.0 5.5  NEUTROABS 4.4  --   HGB 11.2* 10.5*  HCT 36.0 33.9*  MCV 96.5 98.0  PLT 250 224   Basic Metabolic Panel: Recent Labs  Lab 10/28/22 0957 10/29/22 0714  NA 137 137  K 3.8 3.7  CL 102 102  CO2 28 29  GLUCOSE 155* 122*  BUN 23 17  CREATININE 0.94 0.79  CALCIUM 8.7* 8.0*   GFR: Estimated Creatinine Clearance: 70.1 mL/min (by C-G formula based on SCr of 0.79 mg/dL). Liver Function Tests: Recent Labs  Lab 10/28/22 0957 10/29/22 0714  AST 12* 11*  ALT 9 7  ALKPHOS 105 88  BILITOT 0.5 0.5  PROT 7.1 5.6*  ALBUMIN 3.2* 2.5*   No results for input(s): "LIPASE", "AMYLASE" in the last 168 hours. Recent Labs  Lab 10/28/22 0957  AMMONIA 20   Coagulation Profile: Recent Labs  Lab 10/28/22 0957  INR 1.0   Cardiac Enzymes: Recent Labs  Lab 10/28/22 0957  CKTOTAL 161   BNP (last 3 results) No results for input(s): "PROBNP" in the last 8760 hours. HbA1C: No results for input(s): "HGBA1C" in the last 72 hours. CBG: Recent Labs  Lab 10/28/22 1723 10/28/22 2354 10/29/22 0740  GLUCAP 126* 136* 98   Lipid Profile: No results for input(s): "CHOL", "HDL", "LDLCALC", "TRIG", "CHOLHDL", "LDLDIRECT" in the last 72 hours. Thyroid Function  Tests: No results for input(s): "TSH", "T4TOTAL", "FREET4", "T3FREE", "THYROIDAB" in the last 72 hours. Anemia Panel: No results for input(s): "VITAMINB12", "FOLATE", "FERRITIN", "TIBC", "IRON", "RETICCTPCT" in the last 72 hours. Sepsis Labs: Recent Labs  Lab 10/28/22 0957  LATICACIDVEN 0.8    Recent Results (from the past 240 hour(s))  SARS Coronavirus 2 by RT PCR (hospital order, performed in [facility] hospital lab) *cepheid single result test* Anterior Nasal Swab     Status: None   Collection Time: 10/28/22 10:37 AM   Specimen: Anterior Nasal Swab  Result Value Ref Range Status   SARS Coronavirus 2 by RT PCR NEGATIVE NEGATIVE Final    Comment: (NOTE) SARS-CoV-2 target nucleic acids are NOT DETECTED.  The SARS-CoV-2 RNA is generally detectable in upper and lower respiratory specimens during the acute phase of infection. The lowest concentration of SARS-CoV-2 viral copies this assay can detect is 250 copies / mL. A negative result does not preclude SARS-CoV-2 infection and should not be used as the sole basis for treatment or other patient management decisions.  A negative result may occur with improper specimen collection / handling, submission of specimen other than nasopharyngeal swab, presence of viral mutation(s) within the areas targeted by this assay, and inadequate number of viral copies (<250 copies / mL). A negative result must be combined with clinical observations, patient history, and epidemiological information.  Fact Sheet for Patients:   RoadLapTop.co.za  Fact Sheet for Healthcare Providers: http://kim-miller.com/  This test is not yet approved or  cleared by the [place] FDA and has been authorized for detection and/or diagnosis of SARS-CoV-2 by FDA under an Emergency Use Authorization (EUA).  This EUA will remain in effect (meaning this test can be used) for the duration of the COVID-19 declaration under  Section 564(b)(1) of the Act, 21 U.S.C. section 360bbb-3(b)(1), unless the authorization is terminated or revoked sooner.  Performed at [facility], [address], [place], [place] [address]          Radiology Studies: MR BRAIN WO CONTRAST  Result Date: 10/29/2022 CLINICAL DATA:  Mental status change EXAM: MRI HEAD WITHOUT CONTRAST TECHNIQUE: Multiplanar, multiecho pulse sequences of the brain and surrounding structures were obtained without intravenous contrast. COMPARISON:  04/20/2019 MRI head common correlation is also made with 10/28/2022 CT head FINDINGS: Evaluation is limited by motion. Brain: No restricted diffusion to suggest acute or subacute infarct. No acute hemorrhage, mass, mass effect, or  midline shift. No hydrocephalus or extra-axial collection. Partial empty sella. Normal craniocervical junction. No hemosiderin deposition to suggest remote hemorrhage. Scattered T2 hyperintense signal in the periventricular white matter, likely the sequela of mild chronic small vessel ischemic disease. Vascular: Normal arterial flow voids. Skull and upper cervical spine: Normal marrow signal. Sinuses/Orbits: Clear paranasal sinuses. No acute finding in the orbits. Status post bilateral lens replacements. Other: The mastoid air cells are well aerated. IMPRESSION: Evaluation is limited by motion. Within this limitation, no acute intracranial process. Electronically Signed   By: Wiliam Ke M.D.   On: 10/29/2022 01:28   VAS [place] LOWER EXTREMITY VENOUS (DVT) (7a-7p)  Result Date: 10/28/2022  Lower Venous DVT Study Patient Name:  [name]  Date of Exam:   10/28/2022 Medical Rec #: [id number]                Accession #:    [id number] Date of Birth: June 25, 1940                Patient Gender: F Patient Age:   2 years Exam Location:  [facility] Procedure:      VAS [place] LOWER EXTREMITY VENOUS (DVT) Referring Phys: Lorin Picket [place]  --------------------------------------------------------------------------------  Indications: Swelling, and Pain.  Limitations: Body habitus, poor ultrasound/tissue interface and inability to tolerate compressions. Performing Technologist: McKayla Maag RVT, [place]  Examination Guidelines: A complete evaluation includes B-mode imaging, spectral Doppler, color Doppler, and power Doppler as needed of all accessible portions of each vessel. Bilateral testing is considered an integral part of a complete examination. Limited examinations for reoccurring indications may be performed as noted. The reflux portion of the exam is performed with the patient in reverse Trendelenburg.  +-----+---------------+---------+-----------+----------+-----------------------+ RIGHTCompressibilityPhasicitySpontaneityPropertiesThrombus Aging          +-----+---------------+---------+-----------+----------+-----------------------+ CFV                 Yes      Yes                  Patent by color doppler +-----+---------------+---------+-----------+----------+-----------------------+ SFJ                 Yes      Yes                  Patent by color doppler +-----+---------------+---------+-----------+----------+-----------------------+   +--------+---------------+---------+-----------+----------+--------------------+ LEFT    CompressibilityPhasicitySpontaneityPropertiesThrombus Aging       +--------+---------------+---------+-----------+----------+--------------------+ CFV                    Yes      Yes                  Patent by color                                                           doppler              +--------+---------------+---------+-----------+----------+--------------------+ SFJ                    Yes      Yes                  Patent by color  doppler               +--------+---------------+---------+-----------+----------+--------------------+ FV Prox Full                                                              +--------+---------------+---------+-----------+----------+--------------------+ FV Mid  Full           Yes      Yes                                       +--------+---------------+---------+-----------+----------+--------------------+ FV                     Yes      Yes                  Patent by color      Distal                                               doppler              +--------+---------------+---------+-----------+----------+--------------------+ PFV     Full                                                              +--------+---------------+---------+-----------+----------+--------------------+ POP                    Yes      Yes                  Patent by color                                                           doppler              +--------+---------------+---------+-----------+----------+--------------------+ PTV                    Yes      Yes                  Patent by color                                                           doppler              +--------+---------------+---------+-----------+----------+--------------------+ PERO                   Yes      Yes  Patent by color                                                           doppler              +--------+---------------+---------+-----------+----------+--------------------+     Summary: RIGHT: - No evidence of common femoral vein obstruction.  LEFT: - There is no evidence of deep vein thrombosis in the lower extremity. However, portions of this examination were limited- see technologist comments above.  - No cystic structure found in the popliteal fossa.  *See table(s) above for measurements and observations. Electronically signed by Sherald Hess MD on 10/28/2022 at  3:47:32 PM.    Final    DG Femur Min 2 Views Left  Result Date: 10/28/2022 CLINICAL DATA:  thigh pain EXAM: LEFT FEMUR 2 VIEWS COMPARISON:  June 26, 2018 FINDINGS: Osteopenia. No acute fracture or dislocation. Compartmental degenerative changes most pronounced in the medial compartment. Femoroacetabular joint is obscured by overlapping soft tissues. No area of erosion or osseous destruction. No unexpected radiopaque foreign body. Soft tissues are unremarkable. IMPRESSION: 1. No acute fracture or dislocation. If there is a persistent clinical concern for nondisplaced hip or pelvic fracture, recommend dedicated pelvic CT or MRI. Electronically Signed   By: Meda Klinefelter M.D.   On: 10/28/2022 13:24   DG Chest 2 View  Result Date: 10/28/2022 CLINICAL DATA:  82 year old female with altered mental status, hallucination, recent lung cancer diagnosis. Generalized pain. EXAM: CHEST - 2 VIEW COMPARISON:  CT cervical spine 09/12/2022. Chest radiographs 09/12/2022 and earlier. FINDINGS: Semi upright AP and lateral views of the chest at 1008 hours. Right upper lobe lung nodule is visible (arrows). Mediastinal contours remain normal. Lung volumes are stable since 2021. No pneumothorax, pulmonary edema, pleural effusion or other confluent lung opacity. No acute osseous abnormality identified. Negative visible bowel gas. IMPRESSION: 1. Suspicious right upper lobe lung nodule as demonstrated on Cervical CT 09/12/2022. 2. No new cardiopulmonary abnormality. Electronically Signed   By: Odessa Fleming M.D.   On: 10/28/2022 10:45   CT Head Wo Contrast  Result Date: 10/28/2022 CLINICAL DATA:  82 year old female with altered mental status, hallucination, recent lung cancer diagnosis. Generalized pain. EXAM: CT HEAD WITHOUT CONTRAST TECHNIQUE: Contiguous axial images were obtained from the base of the skull through the vertex without intravenous contrast. RADIATION DOSE REDUCTION: This exam was performed according to the  departmental dose-optimization program which includes automated exposure control, adjustment of the mA and/or kV according to patient size and/or use of iterative reconstruction technique. COMPARISON:  Head CT 09/12/2022.  Brain MRI 04/20/2019. FINDINGS: Brain: Cerebral volume is within normal limits for age. No midline shift, ventriculomegaly, mass effect, evidence of mass lesion, intracranial hemorrhage or evidence of cortically based acute infarction. Largely normal for age gray-white differentiation, stable since May including mild chronic right basal ganglia heterogeneity. Vascular: No suspicious intracranial vascular hyperdensity. Calcified atherosclerosis at the skull base. Skull: Hyperostosis.  No acute or suspicious osseous lesion. Sinuses/Orbits: Visualized paranasal sinuses and mastoids are stable and well aerated. Other: Visualized orbits and scalp soft tissues are within normal limits. IMPRESSION: No acute intracranial abnormality. Stable and largely negative for age noncontrast CT appearance of the brain. Electronically Signed   By: Odessa Fleming M.D.   On: 10/28/2022 10:43  Scheduled Meds:  amLODipine  5 mg Oral Daily   aspirin EC  81 mg Oral Daily   Chlorhexidine Gluconate Cloth  6 each Topical Daily   clopidogrel  75 mg Oral Daily   enoxaparin (LOVENOX) injection  40 mg Subcutaneous Q24H   feeding supplement  237 mL Oral BID BM   furosemide  40 mg Oral Daily   gabapentin  300 mg Oral BID   insulin aspart  0-15 Units Subcutaneous TID WC   insulin aspart  0-5 Units Subcutaneous QHS   levETIRAcetam  500 mg Oral BID   potassium chloride SA  20 mEq Oral BID   Continuous Infusions:  sodium chloride 100 mL/hr at 10/29/22 1009     LOS: 0 days    Time spent: 39 min Alwyn Ren, MD 10/29/2022, 11:08 AM

## 2022-10-30 ENCOUNTER — Inpatient Hospital Stay (HOSPITAL_COMMUNITY): Payer: Medicare PPO

## 2022-10-30 DIAGNOSIS — G9341 Metabolic encephalopathy: Secondary | ICD-10-CM | POA: Diagnosis not present

## 2022-10-30 LAB — BASIC METABOLIC PANEL
Anion gap: 6 (ref 5–15)
BUN: 16 mg/dL (ref 8–23)
CO2: 28 mmol/L (ref 22–32)
Calcium: 8.1 mg/dL — ABNORMAL LOW (ref 8.9–10.3)
Chloride: 104 mmol/L (ref 98–111)
Creatinine, Ser: 0.8 mg/dL (ref 0.44–1.00)
GFR, Estimated: >60 mL/min (ref >60)
Glucose, Bld: 80 mg/dL (ref 70–99)
Potassium: 3.7 mmol/L (ref 3.5–5.1)
Sodium: 138 mmol/L (ref 135–145)

## 2022-10-30 LAB — CBC WITH DIFFERENTIAL/PLATELET
Abs Immature Granulocytes: 0.01 10*3/uL (ref 0.00–0.07)
Basophils Absolute: 0 10*3/uL (ref 0.0–0.1)
Basophils Relative: 1 %
Eosinophils Absolute: 0.1 10*3/uL (ref 0.0–0.5)
Eosinophils Relative: 2 %
HCT: 32.3 % — ABNORMAL LOW (ref 36.0–46.0)
Hemoglobin: 9.8 g/dL — ABNORMAL LOW (ref 12.0–15.0)
Immature Granulocytes: 0 %
Lymphocytes Relative: 32 %
Lymphs Abs: 1.5 10*3/uL (ref 0.7–4.0)
MCH: 30.3 pg (ref 26.0–34.0)
MCHC: 30.3 g/dL (ref 30.0–36.0)
MCV: 100 fL (ref 80.0–100.0)
Monocytes Absolute: 0.6 10*3/uL (ref 0.1–1.0)
Monocytes Relative: 12 %
Neutro Abs: 2.6 10*3/uL (ref 1.7–7.7)
Neutrophils Relative %: 53 %
Platelets: 193 10*3/uL (ref 150–400)
RBC: 3.23 MIL/uL — ABNORMAL LOW (ref 3.87–5.11)
RDW: 13.7 % (ref 11.5–15.5)
WBC: 4.9 10*3/uL (ref 4.0–10.5)
nRBC: 0 % (ref 0.0–0.2)

## 2022-10-30 LAB — GLUCOSE, CAPILLARY
Glucose-Capillary: 107 mg/dL — ABNORMAL HIGH (ref 70–99)
Glucose-Capillary: 117 mg/dL — ABNORMAL HIGH (ref 70–99)
Glucose-Capillary: 142 mg/dL — ABNORMAL HIGH (ref 70–99)
Glucose-Capillary: 69 mg/dL — ABNORMAL LOW (ref 70–99)
Glucose-Capillary: 87 mg/dL (ref 70–99)
Glucose-Capillary: 89 mg/dL (ref 70–99)
Glucose-Capillary: 95 mg/dL (ref 70–99)

## 2022-10-30 LAB — BLOOD GAS, ARTERIAL
Acid-Base Excess: 4.2 mmol/L — ABNORMAL HIGH (ref 0.0–2.0)
Delivery systems: POSITIVE
Expiratory PAP: 8 cmH2O
Inspiratory PAP: 24 cmH2O
Mode: POSITIVE
O2 Saturation: 100 %
pCO2 arterial: 55 mmHg — ABNORMAL HIGH (ref 32–48)
pH, Arterial: 7.36 (ref 7.35–7.45)
pO2, Arterial: 112 mmHg — ABNORMAL HIGH (ref 83–108)

## 2022-10-30 LAB — HEMOGLOBIN A1C
Hgb A1c MFr Bld: 6.7 % — ABNORMAL HIGH (ref 4.8–5.6)
Mean Plasma Glucose: 146 mg/dL

## 2022-10-30 LAB — URINE CULTURE

## 2022-10-30 MED ORDER — FUROSEMIDE 10 MG/ML IJ SOLN
20.0000 mg | Freq: Every day | INTRAMUSCULAR | Status: DC
  Administered 2022-10-30 – 2022-11-04 (×6): 20 mg via INTRAVENOUS
  Filled 2022-10-30 (×7): qty 2

## 2022-10-30 MED ORDER — ACETAMINOPHEN 10 MG/ML IV SOLN
1000.0000 mg | Freq: Four times a day (QID) | INTRAVENOUS | Status: AC
  Administered 2022-10-31 (×2): 1000 mg via INTRAVENOUS
  Filled 2022-10-30 (×2): qty 100

## 2022-10-30 MED ORDER — POTASSIUM CHLORIDE CRYS ER 20 MEQ PO TBCR
20.0000 meq | EXTENDED_RELEASE_TABLET | Freq: Every day | ORAL | Status: DC
  Administered 2022-10-31 – 2022-11-05 (×6): 20 meq via ORAL
  Filled 2022-10-30 (×6): qty 1

## 2022-10-30 MED ORDER — DEXTROSE 50 % IV SOLN
12.5000 g | INTRAVENOUS | Status: AC
  Administered 2022-10-30: 12.5 g via INTRAVENOUS
  Filled 2022-10-30: qty 50

## 2022-10-30 MED ORDER — HYDRALAZINE HCL 25 MG PO TABS
25.0000 mg | ORAL_TABLET | Freq: Three times a day (TID) | ORAL | Status: DC
  Administered 2022-10-30 – 2022-11-05 (×17): 25 mg via ORAL
  Filled 2022-10-30 (×17): qty 1

## 2022-10-30 MED ORDER — KETOROLAC TROMETHAMINE 15 MG/ML IJ SOLN
15.0000 mg | Freq: Four times a day (QID) | INTRAMUSCULAR | Status: AC | PRN
  Administered 2022-10-30: 15 mg via INTRAVENOUS
  Filled 2022-10-30 (×2): qty 1

## 2022-10-30 MED ORDER — DEXTROSE 5 % IV SOLN: INTRAVENOUS | Status: DC

## 2022-10-30 MED ORDER — POTASSIUM CHLORIDE CRYS ER 20 MEQ PO TBCR: 20.0000 meq | EXTENDED_RELEASE_TABLET | Freq: Every day | ORAL | Status: DC

## 2022-10-30 MED ORDER — INSULIN ASPART 100 UNIT/ML IJ SOLN
0.0000 [IU] | INTRAMUSCULAR | Status: DC
  Administered 2022-10-30 – 2022-11-01 (×5): 2 [IU] via SUBCUTANEOUS
  Administered 2022-11-01 – 2022-11-03 (×7): 3 [IU] via SUBCUTANEOUS
  Administered 2022-11-03: 2 [IU] via SUBCUTANEOUS
  Administered 2022-11-03: 5 [IU] via SUBCUTANEOUS
  Administered 2022-11-03: 3 [IU] via SUBCUTANEOUS
  Administered 2022-11-03: 5 [IU] via SUBCUTANEOUS

## 2022-10-30 MED ORDER — DEXTROSE 10 % IV SOLN
INTRAVENOUS | Status: DC
Start: 2022-10-30 — End: 2022-10-30

## 2022-10-30 NOTE — Plan of Care (Signed)
  Problem: Metabolic: Goal: Ability to maintain appropriate glucose levels will improve Outcome: Not Progressing Patient with 2 episodes of hypoglycemia. Started on d5 gtt and blood sugar checks every 4 hours   Problem: Clinical Measurements: Goal: Cardiovascular complication will be avoided Outcome: Progressing

## 2022-10-30 NOTE — Progress Notes (Addendum)
PROGRESS NOTE    [place]  ZOX:[id number] DOB: 07-13-1940 DOA: 10/28/2022 PCP: System, Provider Not In   Brief Narrative: [name] is a 82 y.o. female with medical history significant of allergic rhinitis, osteoarthritis, history of CVA with residual right-sided hemiparesis, chronic diastolic heart failure, cholelithiasis, chronic back pain, liver cirrhosis, colon polyps, COPD, depression, type 2 diabetes, diverticulosis, fatty liver, history of liver cirrhosis, gastritis, gastroparesis, GERD, history of headaches, hyperlipidemia, hypertension, CAD, history of MI, diabetic neuropathy, class II obesity, OSA not on CPAP, osteoporosis, seizure disorder who was brought to the emergency department due to generalized pain and altered mental status with hallucinations for the past 4 days, but they seem to be significantly worse since yesterday.  She called her daughter around 0300 this morning stating that there were a lot of people in her house that were friends of her kids..  She is somnolent and unable to provide further information at this time.  The history is taken from her daughters (1 at bedside and the other 1 was on the phone).  She had a similar picture like this about 4 years ago that was due to with urinary tract infection.  They also stated that she has been having frequent vomiting and has lost weight recently.  She also has been following with Dr. 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   No fever, but has been having myalgias and nonproductive cough.  She has been having hallucinations.  The patient is still somnolent and unable to provide further information.  She received 50 mcg of fentanyl x 2 in the emergency department around 1300 and is still somnolent with inability to provide further information.   Lab work: Urinalysis was hazy with ketones of 5 and protein of 100 mg/dL.  Negative nitrites and leukocyte  esterase.  There were many bacteria.  The rest of the urine analysis was normal.  UDS was negative.  Her CBC showed a white count of 7.0, hemoglobin 11.2 g/dL platelets 811.  Unremarkable PT and INR.  Negative coronavirus PCR.  Lactic acid, ammonia, troponin, total CK, alcohol level and BNP were normal.  CMP with normal electrolytes after calcium correction.  AST 12 units/L, albumin 3.2 g/dL and glucose 914 mg/dL.  The rest of the LFTs and renal function were normal.   Imaging: Two-view portable chest radiograph with suspicious right upper lobe lung nodule as demonstrated on cervical CT on 09/12/2022.  Left femur x-ray with no acute fracture or dislocation.  There is persistent clinical concern for nondisplaced hip or pelvic fracture, recommend dedicated pelvic CT or MRI.  CT head without contrast with no acute intracranial abnormality.   ED course: Initial vital signs were temperature 97.9 F, pulse 79, respiration 20, BP 156/102 mmHg and O2 sat 97% on room air.  The patient received fentanyl 50 mcg IVP x 2, LR 500 mL bolus and ceftriaxone 1 g IVPB.    Assessment & Plan:   Principal Problem:   Acute metabolic encephalopathy Active Problems:   COPD   GERD   Hepatic cirrhosis (HCC)   Gastroparesis   Chronic diastolic CHF (congestive heart failure) (HCC)   Vomiting   Essential hypertension   HLD (hyperlipidemia)   Sleep disorder   Type 2 diabetes mellitus (HCC)   Epilepsy (HCC)   CAD (coronary artery disease)   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)  #1 acute metabolic encephalopathy likely due  to to multiple causes including hypercapnia ?  Seizures ?  UTI,?  Acute urinary retention-patient was admitted with complaints of hallucination and confusion.  Urine culture grows Citrobacter sensitive to all but nitrofurantoin.  Repeat ABG today 7.3 6/55/112 on BiPAP She was taken off BiPAP this morning she was much more awake and speaking and asking for food and drink.   Family was at bedside. At baseline she lives at home does not walk much her daughter lives next door and takes care of the mother.  EEG no elliptic form discharges  patient has history of obstructive sleep apnea but does not use his CPAP at home She was retaining urine with placement of Foley over 1 L.   #2 acute hypercapnic respiratory failure in the setting of COPD obstructive sleep apnea-patient also continues to smoke currently and has been smoking for over 70 years. BiPAP nightly and as needed  #3 history of epilepsy on Keppra and gabapentin  #4 acute urinary retention with Citrobacter UTI -on Rocephin  Foley placed in the ER  #5 history of  Cirrhosis/gastroparesis-  followed by [facility] as outpatient.  #6 chronic diastolic heart failure stable follow-up echo ejection fraction 55 to 60%  #7 essential hypertension-her Lasix and Norvasc had been on hold due to soft blood pressure now pressure has been increasing, will restart Lasix.   #8 hyperlipidemia on atorvastatin  #9 type 2 diabetes continue SSI  #10 morbid obesity complicates overall prognosis and management  #11 history of stroke with hemiparesis continue statin and aspirin and plavix  #12 right upper lobe lung nodule saw Dr. Arbutus Ped on 10/14/2022 who recommended PET scan and follow-up with him in 3 weeks.  Estimated body mass index is 38.79 kg/m as calculated from the following:   Height as of this encounter: 5\' 6"  (1.676 m).   Weight as of this encounter: 109 kg.  DVT prophylaxis: Lovenox  code Status: DNR family Communication: Daughter at the side  disposition Plan:  Status is: Inpatient Consultants:  None  Procedures: None Antimicrobials: None  Subjective: She was on BiPAP when I saw her this morning family was at bedside   objective: Vitals:   10/30/22 0900 10/30/22 1100 10/30/22 1104 10/30/22 1200  BP:  (!) 162/56  (!) 181/59  Pulse:      Resp: 18 16  16   Temp:   98.5 F (36.9 C)   TempSrc:    Oral   SpO2:  94%    Weight:      Height:        Intake/Output Summary (Last 24 hours) at 10/30/2022 1328 Last data filed at 10/30/2022 1155 Gross per 24 hour  Intake 916.09 ml  Output 785 ml  Net 131.09 ml   Filed Weights   10/28/22 0926  Weight: 109 kg    Examination:  General exam: Appears chronically ill-appearing  respiratory system: Diminished to auscultation. Respiratory effort normal. Cardiovascular system: S1 & S2 heard, RRR. No JVD, murmurs, rubs, gallops or clicks. No pedal edema. Gastrointestinal system: Abdomen is distended, soft and nontender. No organomegaly or masses felt. Normal bowel sounds heard. Central nervous system: On bipap Extremities: Edema 1+  Data Reviewed: I have personally reviewed following labs and imaging studies  CBC: Recent Labs  Lab 10/28/22 0957 10/29/22 0714 10/30/22 0258  WBC 7.0 5.5 4.9  NEUTROABS 4.4  --  2.6  HGB 11.2* 10.5* 9.8*  HCT 36.0 33.9* 32.3*  MCV 96.5 98.0 100.0  PLT 250 224 193   Basic  Metabolic Panel: Recent Labs  Lab 10/28/22 0957 10/29/22 0714 10/30/22 0258  NA 137 137 138  K 3.8 3.7 3.7  CL 102 102 104  CO2 28 29 28   GLUCOSE 155* 122* 80  BUN 23 17 16   CREATININE 0.94 0.79 0.80  CALCIUM 8.7* 8.0* 8.1*   GFR: Estimated Creatinine Clearance: 70.1 mL/min (by C-G formula based on SCr of 0.8 mg/dL). Liver Function Tests: Recent Labs  Lab 10/28/22 0957 10/29/22 0714  AST 12* 11*  ALT 9 7  ALKPHOS 105 88  BILITOT 0.5 0.5  PROT 7.1 5.6*  ALBUMIN 3.2* 2.5*   No results for input(s): "LIPASE", "AMYLASE" in the last 168 hours. Recent Labs  Lab 10/28/22 0957  AMMONIA 20   Coagulation Profile: Recent Labs  Lab 10/28/22 0957  INR 1.0   Cardiac Enzymes: Recent Labs  Lab 10/28/22 0957  CKTOTAL 161   BNP (last 3 results) No results for input(s): "PROBNP" in the last 8760 hours. HbA1C: Recent Labs    10/29/22 0714  HGBA1C 6.7*   CBG: Recent Labs  Lab 10/29/22 2244 10/30/22 0015  10/30/22 0339 10/30/22 0735 10/30/22 1059  GLUCAP 127* 89 69* 95 87   Lipid Profile: No results for input(s): "CHOL", "HDL", "LDLCALC", "TRIG", "CHOLHDL", "LDLDIRECT" in the last 72 hours. Thyroid Function Tests: No results for input(s): "TSH", "T4TOTAL", "FREET4", "T3FREE", "THYROIDAB" in the last 72 hours. Anemia Panel: No results for input(s): "VITAMINB12", "FOLATE", "FERRITIN", "TIBC", "IRON", "RETICCTPCT" in the last 72 hours. Sepsis Labs: Recent Labs  Lab 10/28/22 0957  LATICACIDVEN 0.8    Recent Results (from the past 240 hour(s))  SARS Coronavirus 2 by RT PCR (hospital order, performed in [facility] hospital lab) *cepheid single result test* Anterior Nasal Swab     Status: None   Collection Time: 10/28/22 10:37 AM   Specimen: Anterior Nasal Swab  Result Value Ref Range Status   SARS Coronavirus 2 by RT PCR NEGATIVE NEGATIVE Final    Comment: (NOTE) SARS-CoV-2 target nucleic acids are NOT DETECTED.  The SARS-CoV-2 RNA is generally detectable in upper and lower respiratory specimens during the acute phase of infection. The lowest concentration of SARS-CoV-2 viral copies this assay can detect is 250 copies / mL. A negative result does not preclude SARS-CoV-2 infection and should not be used as the sole basis for treatment or other patient management decisions.  A negative result may occur with improper specimen collection / handling, submission of specimen other than nasopharyngeal swab, presence of viral mutation(s) within the areas targeted by this assay, and inadequate number of viral copies (<250 copies / mL). A negative result must be combined with clinical observations, patient history, and epidemiological information.  Fact Sheet for Patients:   RoadLapTop.co.za  Fact Sheet for Healthcare Providers: http://kim-miller.com/  This test is not yet approved or  cleared by the [place] FDA and has been authorized  for detection and/or diagnosis of SARS-CoV-2 by FDA under an Emergency Use Authorization (EUA).  This EUA will remain in effect (meaning this test can be used) for the duration of the COVID-19 declaration under Section 564(b)(1) of the Act, 21 U.S.C. section 360bbb-3(b)(1), unless the authorization is terminated or revoked sooner.  Performed at [facility], [address] [place], [place] [address]   Urine Culture     Status: Abnormal   Collection Time: 10/28/22  1:40 PM   Specimen: Urine, Catheterized  Result Value Ref Range Status   Specimen Description   Final  URINE, CATHETERIZED Performed at [facility] [address], [place], [place] [address]    Special Requests   Final    NONE Performed at [facility], [address] [place], [place] [address]    Culture >=100,000 COLONIES/mL CITROBACTER AMALONATICUS (A)  Final   Report Status 10/30/2022 FINAL  Final   Organism ID, Bacteria CITROBACTER AMALONATICUS (A)  Final      Susceptibility   Citrobacter amalonaticus - MIC*    CEFEPIME <=0.12 SENSITIVE Sensitive     CEFTRIAXONE <=0.25 SENSITIVE Sensitive     CIPROFLOXACIN <=0.25 SENSITIVE Sensitive     GENTAMICIN <=1 SENSITIVE Sensitive     IMIPENEM <=0.25 SENSITIVE Sensitive     NITROFURANTOIN 64 INTERMEDIATE Intermediate     TRIMETH/SULFA <=20 SENSITIVE Sensitive     PIP/TAZO <=4 SENSITIVE Sensitive     * >=100,000 COLONIES/mL CITROBACTER AMALONATICUS  MRSA Next Gen by PCR, Nasal     Status: None   Collection Time: November 03, 2022  6:55 PM   Specimen: Nasal Mucosa; Nasal Swab  Result Value Ref Range Status   MRSA by PCR Next Gen NOT DETECTED NOT DETECTED Final    Comment: (NOTE) The GeneXpert MRSA Assay (FDA approved for NASAL specimens only), is one component of a comprehensive MRSA colonization surveillance program. It is not intended to diagnose MRSA infection nor to guide or monitor treatment for MRSA  infections. Test performance is not FDA approved in patients less than 64 years old. Performed at [facility], [address] [place], [place] [address]          Radiology Studies: EEG adult  Result Date: 11-03-2022 Charlsie Quest, MD     Nov 03, 2022  4:55 PM Patient Name: [name] MRN: [id number] Epilepsy Attending: Charlsie Quest Referring Physician/Provider: Alwyn Ren, MD Date: Nov 03, 2022 Duration: 21.23 mins Patient history: 82yo F with h/o epilepsy now with ams. EEG to evaluate for seizure Level of alertness: Awake, asleep AEDs during EEG study: LEV, GBP Technical aspects: This EEG study was done with scalp electrodes positioned according to the 10-20 International system of electrode placement. Electrical activity was reviewed with band pass filter of 1-70Hz , sensitivity of 7 uV/mm, display speed of 92mm/sec with a 60Hz  notched filter applied as appropriate. EEG data were recorded continuously and digitally stored.  Video monitoring was available and reviewed as appropriate. Description: The posterior dominant rhythm consists of 8-9Hz  activity of moderate voltage (25-35 uV) seen predominantly in posterior head regions, symmetric and reactive to eye opening and eye closing. Sleep was characterized by vertex waves, sleep spindles (12 to 14 Hz), maximal frontocentral region. There is also 15 to 18 Hz  beta activity distributed symmetrically and diffusely. Hyperventilation and photic stimulation were not performed.   IMPRESSION: This study is within normal limits. No seizures or epileptiform discharges were seen throughout the recording. A normal interictal EEG does not exclude the diagnosis of epilepsy. Charlsie Quest   ECHOCARDIOGRAM COMPLETE  Result Date: 2022-11-03    ECHOCARDIOGRAM REPORT   Patient Name:   [name] Date of Exam: November 03, 2022 Medical Rec #:  [id number]               Height:       66.0 in Accession #:    [id number]               Weight:       240.3 lb Date of Birth:  Oct 03, 1940  BSA:          2.162 m Patient Age:    81 years                BP:           157/89 mmHg Patient Gender: F                       HR:           50 bpm. Exam Location:  Inpatient Procedure: 2D Echo, Cardiac Doppler and Color Doppler Indications:    Abnormal ECG R94.31  History:        Patient has prior history of Echocardiogram examinations, most                 recent 01/28/2016. CHF, CAD, COPD; Risk Factors:Hypertension,                 Diabetes, Dyslipidemia and Current Smoker.  Sonographer:    Dondra Prader RVT Referring Phys: [id number] DAVID MANUEL ORTIZ  Sonographer Comments: Technically challenging study due to limited acoustic windows, Technically difficult study due to poor echo windows, suboptimal parasternal window, suboptimal apical window, suboptimal subcostal window and patient is obese. Image acquisition challenging due to patient body habitus, Image acquisition challenging due to respiratory motion, Image acquisition challenging due to uncooperative patient and Image acquisition challenging due to COPD. Patient unable to tolerate exam. Exam ended. IMPRESSIONS  1. Overall poor image quality.  2. Left ventricular ejection fraction, by estimation, is 55 to 60%. The left ventricle has normal function. The left ventricle has no regional wall motion abnormalities. There is mild left ventricular hypertrophy. Left ventricular diastolic function could not be evaluated.  3. Right ventricular systolic function is normal. The right ventricular size is normal.  4. The mitral valve was not well visualized. No evidence of mitral valve regurgitation. Moderate mitral annular calcification.  5. The aortic valve was not well visualized. There is mild calcification of the aortic valve. There is mild thickening of the aortic valve. Aortic valve regurgitation is not visualized. Aortic valve sclerosis is present, with no evidence of aortic valve  stenosis.  FINDINGS  Left Ventricle: Left ventricular ejection fraction, by estimation, is 55 to 60%. The left ventricle has normal function. The left ventricle has no regional wall motion abnormalities. The left ventricular internal cavity size was normal in size. There is  mild left ventricular hypertrophy. Left ventricular diastolic function could not be evaluated. Right Ventricle: The right ventricular size is normal. Right vetricular wall thickness was not assessed. Right ventricular systolic function is normal. Left Atrium: Left atrial size was not well visualized. Right Atrium: Right atrial size was not well visualized. Pericardium: There is no evidence of pericardial effusion. Mitral Valve: The mitral valve was not well visualized. There is mild thickening of the mitral valve leaflet(s). There is mild calcification of the mitral valve leaflet(s). Moderate mitral annular calcification. No evidence of mitral valve regurgitation. Tricuspid Valve: The tricuspid valve is not well visualized. Tricuspid valve regurgitation is trivial. Aortic Valve: The aortic valve was not well visualized. There is mild calcification of the aortic valve. There is mild thickening of the aortic valve. Aortic valve regurgitation is not visualized. Aortic valve sclerosis is present, with no evidence of aortic valve stenosis. Aortic valve mean gradient measures 3.0 mmHg. Aortic valve peak gradient measures 6.5 mmHg. Aortic valve area, by VTI measures 1.85 cm. Pulmonic Valve: The pulmonic valve was normal in structure. Pulmonic  valve regurgitation is trivial. Aorta: The aortic root is normal in size and structure. IAS/Shunts: The interatrial septum was not well visualized. Additional Comments: Overall poor image quality.  LEFT VENTRICLE PLAX 2D LVIDd:         4.40 cm LVIDs:         3.00 cm LV PW:         1.30 cm LV IVS:        1.30 cm LVOT diam:     1.80 cm LV SV:         53 LV SV Index:   25 LVOT Area:     2.54 cm  LEFT ATRIUM           Index LA  diam:      3.40 cm 1.57 cm/m LA Vol (A4C): 30.4 ml 14.06 ml/m  AORTIC VALVE                    PULMONIC VALVE AV Area (Vmax):    1.78 cm     PV Vmax:       0.95 m/s AV Area (Vmean):   1.80 cm     PV Peak grad:  3.6 mmHg AV Area (VTI):     1.85 cm AV Vmax:           127.00 cm/s AV Vmean:          84.700 cm/s AV VTI:            0.288 m AV Peak Grad:      6.5 mmHg AV Mean Grad:      3.0 mmHg LVOT Vmax:         88.70 cm/s LVOT Vmean:        60.000 cm/s LVOT VTI:          0.209 m LVOT/AV VTI ratio: 0.73  AORTA Ao Root diam: 2.70 cm MITRAL VALVE MV Area (PHT): 2.80 cm    SHUNTS MV Decel Time: 271 msec    Systemic VTI:  0.21 m MV E velocity: 75.80 cm/s  Systemic Diam: 1.80 cm MV A velocity: 78.80 cm/s MV E/A ratio:  0.96 Charlton Haws MD Electronically signed by Charlton Haws MD Signature Date/Time: 10/29/2022/12:39:17 PM    Final    MR BRAIN WO CONTRAST  Result Date: 10/29/2022 CLINICAL DATA:  Mental status change EXAM: MRI HEAD WITHOUT CONTRAST TECHNIQUE: Multiplanar, multiecho pulse sequences of the brain and surrounding structures were obtained without intravenous contrast. COMPARISON:  04/20/2019 MRI head common correlation is also made with 10/28/2022 CT head FINDINGS: Evaluation is limited by motion. Brain: No restricted diffusion to suggest acute or subacute infarct. No acute hemorrhage, mass, mass effect, or midline shift. No hydrocephalus or extra-axial collection. Partial empty sella. Normal craniocervical junction. No hemosiderin deposition to suggest remote hemorrhage. Scattered T2 hyperintense signal in the periventricular white matter, likely the sequela of mild chronic small vessel ischemic disease. Vascular: Normal arterial flow voids. Skull and upper cervical spine: Normal marrow signal. Sinuses/Orbits: Clear paranasal sinuses. No acute finding in the orbits. Status post bilateral lens replacements. Other: The mastoid air cells are well aerated. IMPRESSION: Evaluation is limited by motion. Within  this limitation, no acute intracranial process. Electronically Signed   By: Wiliam Ke M.D.   On: 10/29/2022 01:28        Scheduled Meds:  aspirin EC  81 mg Oral Daily   Chlorhexidine Gluconate Cloth  6 each Topical Daily   clopidogrel  75 mg Oral Daily  enoxaparin (LOVENOX) injection  40 mg Subcutaneous Q24H   feeding supplement  237 mL Oral BID BM   gabapentin  300 mg Oral BID   insulin aspart  0-15 Units Subcutaneous TID WC   insulin aspart  0-5 Units Subcutaneous QHS   potassium chloride SA  20 mEq Oral BID   Continuous Infusions:  sodium chloride 20 mL/hr at 10/30/22 1155   acetaminophen Stopped (10/30/22 1125)   cefTRIAXone (ROCEPHIN)  IV Stopped (10/29/22 1426)   dextrose 25 mL/hr at 10/30/22 1155   levETIRAcetam Stopped (10/30/22 1149)     LOS: 1 day    Time spent: 39 min Alwyn Ren, MD 10/30/2022, 1:28 PM

## 2022-10-30 NOTE — Progress Notes (Signed)
This nurse discussed patient's current status with daughters at bedside. Patient's daughter stated, "Well my mom would never want to be intubated but we want her to be." This nurse educated the patient's family on the importance of following the directives of what the patient would want at this time. Family stated they would like to wait for the son of the patient to arrive to further evaluate the patient's current needs and possible future needs/intubation.

## 2022-10-30 NOTE — Progress Notes (Signed)
   10/30/22 1954  BiPAP/CPAP/SIPAP  BiPAP/CPAP/SIPAP Pt Type Adult  BiPAP/CPAP/SIPAP V60  Reason BIPAP/CPAP not in use Other(comment);Non-compliant (Pt is doing well, she has been off of bipap since am. Family reports she doesnt use anythng at home. No resp distress. Machine remained bedside.)  Mask Type Full face mask  Mask Size Large  Set Rate 18 breaths/min  Respiratory Rate 22 breaths/min  IPAP 24 cmH20  EPAP 8 cmH2O  FiO2 (%) 30 %  BiPAP/CPAP /SiPAP Vitals  Resp 20  MEWS Score/Color  MEWS Score 2  MEWS Score Color Yellow

## 2022-10-31 DIAGNOSIS — G9341 Metabolic encephalopathy: Secondary | ICD-10-CM | POA: Diagnosis not present

## 2022-10-31 LAB — GLUCOSE, CAPILLARY
Glucose-Capillary: 118 mg/dL — ABNORMAL HIGH (ref 70–99)
Glucose-Capillary: 132 mg/dL — ABNORMAL HIGH (ref 70–99)
Glucose-Capillary: 133 mg/dL — ABNORMAL HIGH (ref 70–99)
Glucose-Capillary: 149 mg/dL — ABNORMAL HIGH (ref 70–99)
Glucose-Capillary: 98 mg/dL (ref 70–99)

## 2022-10-31 LAB — BLOOD GAS, ARTERIAL
Mode: POSITIVE
O2 Saturation: 100 %
pO2, Arterial: 112 mmHg — ABNORMAL HIGH (ref 83–108)

## 2022-10-31 MED ORDER — AMLODIPINE BESYLATE 10 MG PO TABS
5.0000 mg | ORAL_TABLET | Freq: Every day | ORAL | Status: DC
  Administered 2022-10-31 – 2022-11-05 (×6): 5 mg via ORAL
  Filled 2022-10-31 (×6): qty 1

## 2022-10-31 MED ORDER — LEVETIRACETAM 500 MG PO TABS
500.0000 mg | ORAL_TABLET | Freq: Two times a day (BID) | ORAL | Status: DC
  Administered 2022-10-31 – 2022-11-05 (×10): 500 mg via ORAL
  Filled 2022-10-31 (×10): qty 1

## 2022-10-31 NOTE — Progress Notes (Signed)
   10/31/22 1941  BiPAP/CPAP/SIPAP  Reason BIPAP/CPAP not in use Non-compliant;Other(comment) (Pt states she is doing well Refused bipap. RN aware . Machine remained bedside if needed. family bedside agreed.)  BiPAP/CPAP /SiPAP Vitals  Resp (!) 27  MEWS Score/Color  MEWS Score 2  MEWS Score Color Yellow

## 2022-10-31 NOTE — TOC Initial Note (Signed)
Transition of Care [facility]) - Initial/Assessment Note    Patient Details  Name: [name] MRN: [id number] Date of Birth: 09/14/1940  Transition of Care [facility]) CM/SW Contact:    Adrian Prows, RN Phone Number: 10/31/2022, 4:09 PM  Clinical Narrative:                 [facility] consult for d/c planning; spoke w pt and dtr/POC [name] in room; pt says she is from home and plans to return at d/c; pt's PCP is [name]; she verified insurance; pt denies IPV, food/housing insecurity, and difficulty paying utilities; she has transportation; pt has cane, walker, wheel chair, BSC, and shower chair; she does not have grab bars; pt has aide w/ [name]; pt says she does not have home oxygen; TOC will follow.  Expected Discharge Plan: Home/Self Care Barriers to Discharge: Continued Medical Work up   Patient Goals and CMS Choice Patient states their goals for this hospitalization and ongoing recovery are:: home          Expected Discharge Plan and Services   Discharge Planning Services: CM Consult Post Acute Care Choice: Resumption of Svcs/PTA Provider [facility]) Living arrangements for the past 2 months: Apartment                                      Prior Living Arrangements/Services Living arrangements for the past 2 months: Apartment Lives with:: Self Patient language and need for interpreter reviewed:: Yes Do you feel safe going back to the place where you live?: Yes      Need for Family Participation in Patient Care: Yes (Comment) Care giver support system in place?: Yes (comment) Current home services: DME, Homehealth aide (cane, walker, wheel chair, BSC, shower chair; Aide w/ [facility]) Criminal Activity/Legal Involvement Pertinent to Current Situation/Hospitalization: No - Comment as needed  Activities of Daily Living Home Assistive Devices/Equipment: Cane (specify quad or straight), Eyeglasses, Walker (specify  type) ADL Screening (condition at time of admission) Patient's cognitive ability adequate to safely complete daily activities?: No Is the patient deaf or have difficulty hearing?: No Does the patient have difficulty seeing, even when wearing glasses/contacts?: No Does the patient have difficulty concentrating, remembering, or making decisions?: Yes Patient able to express need for assistance with ADLs?: Yes Does the patient have difficulty dressing or bathing?: Yes Independently performs ADLs?: No Communication: Independent Dressing (OT): Needs assistance Is this a change from baseline?: Pre-admission baseline Grooming: Needs assistance Is this a change from baseline?: Pre-admission baseline Feeding: Needs assistance Bathing: Needs assistance Toileting: Needs assistance In/Out Bed: Needs assistance Walks in Home: Needs assistance Does the patient have difficulty walking or climbing stairs?: Yes Weakness of Legs: Both Weakness of Arms/Hands: Both  Permission Sought/Granted Permission sought to share information with : Case Manager Permission granted to share information with : Yes, Verbal Permission Granted  Share Information with NAME: Case Manager     Permission granted to share info w Relationship: [name] (dtr) [phone]     Emotional Assessment Appearance:: Appears stated age Attitude/Demeanor/Rapport: Gracious Affect (typically observed): Accepting Orientation: : Oriented to Self, Oriented to Place, Oriented to  Time, Oriented to Situation Alcohol / Substance Use: Not Applicable Psych Involvement: No (comment)  Admission diagnosis:  Altered mental status, unspecified altered mental status type [R41.82] Acute metabolic encephalopathy [G93.41] Patient Active Problem List   Diagnosis Date Noted  Acute metabolic encephalopathy 10/28/2022   Type 2 diabetes mellitus (HCC) 10/28/2022   Epilepsy (HCC) 10/28/2022   CAD (coronary artery disease) 10/28/2022    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) 10/28/2022   Pain due to onychomycosis of toenails of both feet 04/20/2022   Generalized weakness    Seizure (HCC)    AMS (altered mental status) 04/20/2019   Hypoglycemia    Critical lower limb ischemia (HCC) 10/28/2017   PNA (pneumonia) 06/08/2017   Epigastric pain 05/27/2017   Vitamin D deficiency 02/15/2017   Cough 05/26/2016   Abnormal chest xray 03/08/2016   Contusion of left chest wall 12/29/2015   Diabetes (HCC) 06/20/2015   Sleep disorder 06/20/2015   Screening for cancer 06/20/2015   Anemia 05/22/2015   CAP (community acquired pneumonia) 05/06/2015   AKI (acute kidney injury) (HCC) 05/06/2015   Nausea vomiting and diarrhea 05/06/2015   Essential hypertension 05/06/2015   Diabetes mellitus with complication (HCC) 05/06/2015   Dependent edema 05/06/2015   HLD (hyperlipidemia) 05/06/2015   Wellness examination 07/05/2014   Chronic left hip pain 11/27/2013   Chronic pain syndrome 06/27/2013   Renal insufficiency 03/19/2013   Routine general medical examination at a health care facility 03/19/2013   Encounter for long-term (current) use of other medications 03/09/2013   Vomiting 11/22/2012   Chronic diastolic CHF (congestive heart failure) (HCC) 08/10/2012   Foot pain 02/22/2012   Personal history of colonic polyps 08/17/2011   Edema 06/28/2011   Gastroparesis 03/15/2011   Low back pain 01/12/2011   Cramp of limb 10/13/2010   INGROWN TOENAIL 06/23/2010   KNEE PAIN, RIGHT 02/03/2010   Convulsions (HCC) 01/06/2010   HYPERSOMNIA, ASSOCIATED WITH SLEEP APNEA 05/07/2009   OTHER DYSPNEA AND RESPIRATORY ABNORMALITIES 04/23/2009   COPD 04/22/2009   HYPERCHOLESTEROLEMIA 02/25/2009   HYPERTENSION 12/20/2008   Hemiplegia, late effect of cerebrovascular disease (HCC) 12/20/2008   SMOKER 02/22/2008   DEPRESSION 02/22/2008   DYSPNEA 02/22/2008   Chest wall pain 02/22/2008   ASYMPTOMATIC POSTMENOPAUSAL STATUS  02/22/2008   HYPOKALEMIA 01/15/2008   COLONIC POLYPS 09/13/2007   DEGENERATIVE JOINT DISEASE 06/15/2007   ALLERGIC RHINITIS 10/29/2006   GERD 10/29/2006   Hepatic cirrhosis (HCC) 10/29/2006   Osteoporosis 10/29/2006   PCP:  System, Provider Not In Pharmacy:   [id number] G[place] Otto, [place] - [address] AT [facility] OF [address] [place] [address] Phone: [phone] Fax: [phone]     Social Determinants of Health (SDOH) Social History: SDOH Screenings   Food Insecurity: No Food Insecurity (10/31/2022)  Housing: Low Risk  (10/31/2022)  Transportation Needs: No Transportation Needs (10/31/2022)  Utilities: Not At Risk (10/31/2022)  Alcohol Screen: Low Risk  (06/07/2022)  Physical Activity: Inactive (06/07/2022)  Tobacco Use: High Risk (10/28/2022)   SDOH Interventions: Food Insecurity Interventions: Intervention Not Indicated, Inpatient TOC Housing Interventions: Intervention Not Indicated, Inpatient TOC Transportation Interventions: Intervention Not Indicated, Inpatient TOC Utilities Interventions: Intervention Not Indicated, Inpatient TOC   Readmission Risk Interventions     No data to display

## 2022-10-31 NOTE — Progress Notes (Signed)
PROGRESS NOTE    [place]  GNF:[id number] DOB: [birth year] DOA: 10/28/2022 PCP: System, Provider Not In   Brief Narrative: [name] is a 82 y.o. female with medical history significant of allergic rhinitis, osteoarthritis, history of CVA with residual right-sided hemiparesis, chronic diastolic heart failure, cholelithiasis, chronic back pain, liver cirrhosis, colon polyps, COPD, depression, type 2 diabetes, diverticulosis, fatty liver, history of liver cirrhosis, gastritis, gastroparesis, GERD, history of headaches, hyperlipidemia, hypertension, CAD, history of MI, diabetic neuropathy, class II obesity, OSA not on CPAP, osteoporosis, seizure disorder who was brought to the emergency department due to generalized pain and altered mental status with hallucinations for the past 4 days, but they seem to be significantly worse since yesterday.  She called her daughter around 0300 this morning stating that there were a lot of people in her house that were friends of her kids..  She is somnolent and unable to provide further information at this time.  The history is taken from her daughters (1 at bedside and the other 1 was on the phone).  She had a similar picture like this about 4 years ago that was due to with urinary tract infection.  They also stated that she has been having frequent vomiting and has lost weight recently.  She also has been following with Dr. 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   No fever, but has been having myalgias and nonproductive cough.  She has been having hallucinations.  The patient is still somnolent and unable to provide further information.  She received 50 mcg of fentanyl x 2 in the emergency department around 1300 and is still somnolent with inability to provide further information.   Lab work: Urinalysis was hazy with ketones of 5 and protein of 100 mg/dL.  Negative nitrites and leukocyte  esterase.  There were many bacteria.  The rest of the urine analysis was normal.  UDS was negative.  Her CBC showed a white count of 7.0, hemoglobin 11.2 g/dL platelets 846.  Unremarkable PT and INR.  Negative coronavirus PCR.  Lactic acid, ammonia, troponin, total CK, alcohol level and BNP were normal.  CMP with normal electrolytes after calcium correction.  AST 12 units/L, albumin 3.2 g/dL and glucose 962 mg/dL.  The rest of the LFTs and renal function were normal.   Imaging: Two-view portable chest radiograph with suspicious right upper lobe lung nodule as demonstrated on cervical CT on 09/12/2022.  Left femur x-ray with no acute fracture or dislocation.  There is persistent clinical concern for nondisplaced hip or pelvic fracture, recommend dedicated pelvic CT or MRI.  CT head without contrast with no acute intracranial abnormality.   ED course: Initial vital signs were temperature 97.9 F, pulse 79, respiration 20, BP 156/102 mmHg and O2 sat 97% on room air.  The patient received fentanyl 50 mcg IVP x 2, LR 500 mL bolus and ceftriaxone 1 g IVPB.    Assessment & Plan:   Principal Problem:   Acute metabolic encephalopathy Active Problems:   COPD   GERD   Hepatic cirrhosis (HCC)   Gastroparesis   Chronic diastolic CHF (congestive heart failure) (HCC)   Vomiting   Essential hypertension   HLD (hyperlipidemia)   Sleep disorder   Type 2 diabetes mellitus (HCC)   Epilepsy (HCC)   CAD (coronary artery disease)   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)  #1 Acute metabolic encephalopathy likely due  to to multiple causes including hypercapnia ?  Seizures ?  UTI,?  Acute urinary retention-patient was admitted with complaints of hallucination and confusion.  She was placed on BiPAP with improvement in her mentation.  Repeat ABG on BiPAP 7.3 6/55/112  urine culture grows Citrobacter sensitive to all but nitrofurantoin.  EEG did not show any ongoing seizure  activities. At baseline she lives at home does not walk much her daughter lives next door and takes care of the mother. patient has history of obstructive sleep apnea but does not use his CPAP at home She was retaining urine with placement of Foley over 1 L.   #2 acute hypercapnic respiratory failure in the setting of COPD obstructive sleep apnea-patient also continues to smoke currently and has been smoking for over 70 years. BiPAP nightly and as needed  #3 history of epilepsy on Keppra and gabapentin  #4 acute urinary retention with Citrobacter UTI -on Rocephin  Foley placed in the ER  #5 history of  Cirrhosis/gastroparesis-  followed by [facility] as outpatient.  #6 chronic diastolic heart failure stable follow-up echo ejection fraction 55 to 60%  #7 essential hypertension-her Lasix and Norvasc had been on hold due to soft blood pressure now pressure has been increasing, will restart Lasix.  Restart Norvasc 7/14  #8 hyperlipidemia on atorvastatin  #9 type 2 diabetes continue SSI  #10 morbid obesity complicates overall prognosis and management  #11 history of stroke with hemiparesis continue statin and aspirin and plavix  #12 right upper lobe lung nodule saw Dr. Arbutus Ped on 10/14/2022 who recommended PET scan and follow-up with him in 3 weeks.  Estimated body mass index is 38.79 kg/m as calculated from the following:   Height as of this encounter: 5\' 6"  (1.676 m).   Weight as of this encounter: 109 kg.  DVT prophylaxis: Lovenox  code Status: DNR family Communication: Daughter at the side  disposition Plan:  Status is: Inpatient Consultants:  None  Procedures: None Antimicrobials: None  Subjective:  FAMILY at bed side She is resting  On [place] 3 L objective: Vitals:   10/31/22 0800 10/31/22 0900 10/31/22 1000 10/31/22 1107  BP: (!) 179/72 (!) 206/79 (!) 170/86 (!) 142/71  Pulse:      Resp: (!) 29 16 (!) 22 18  Temp:      TempSrc:      SpO2: 98%  98% 100%  Weight:       Height:        Intake/Output Summary (Last 24 hours) at 10/31/2022 1139 Last data filed at 10/31/2022 0954 Gross per 24 hour  Intake 1765.47 ml  Output 1895 ml  Net -129.53 ml   Filed Weights   10/28/22 0926  Weight: 109 kg    Examination:  General exam: Appears chronically ill-appearing  respiratory system: Diminished to auscultation. Respiratory effort normal. Cardiovascular system: S1 & S2 heard, RRR. No JVD, murmurs, rubs, gallops or clicks. No pedal edema. Gastrointestinal system: Abdomen is distended, soft and nontender. No organomegaly or masses felt. Normal bowel sounds heard. Central nervous system: On bipap Extremities: Edema 1+  Data Reviewed: I have personally reviewed following labs and imaging studies  CBC: Recent Labs  Lab 10/28/22 0957 10/29/22 0714 10/30/22 0258  WBC 7.0 5.5 4.9  NEUTROABS 4.4  --  2.6  HGB 11.2* 10.5* 9.8*  HCT 36.0 33.9* 32.3*  MCV 96.5 98.0 100.0  PLT 250 224 193   Basic Metabolic Panel: Recent Labs  Lab 10/28/22 0957 10/29/22 0714 10/30/22 0258  NA 137 137 138  K 3.8 3.7 3.7  CL 102 102 104  CO2 28 29 28   GLUCOSE 155* 122* 80  BUN 23 17 16   CREATININE 0.94 0.79 0.80  CALCIUM 8.7* 8.0* 8.1*   GFR: Estimated Creatinine Clearance: 70.1 mL/min (by C-G formula based on SCr of 0.8 mg/dL). Liver Function Tests: Recent Labs  Lab 10/28/22 0957 10/29/22 0714  AST 12* 11*  ALT 9 7  ALKPHOS 105 88  BILITOT 0.5 0.5  PROT 7.1 5.6*  ALBUMIN 3.2* 2.5*   No results for input(s): "LIPASE", "AMYLASE" in the last 168 hours. Recent Labs  Lab 10/28/22 0957  AMMONIA 20   Coagulation Profile: Recent Labs  Lab 10/28/22 0957  INR 1.0   Cardiac Enzymes: Recent Labs  Lab 10/28/22 0957  CKTOTAL 161   BNP (last 3 results) No results for input(s): "PROBNP" in the last 8760 hours. HbA1C: Recent Labs    10/29/22 0714  HGBA1C 6.7*   CBG: Recent Labs  Lab 10/30/22 1059 10/30/22 1610 10/30/22 1944 10/30/22 2318  10/31/22 0742  GLUCAP 87 142* 117* 107* 98   Lipid Profile: No results for input(s): "CHOL", "HDL", "LDLCALC", "TRIG", "CHOLHDL", "LDLDIRECT" in the last 72 hours. Thyroid Function Tests: No results for input(s): "TSH", "T4TOTAL", "FREET4", "T3FREE", "THYROIDAB" in the last 72 hours. Anemia Panel: No results for input(s): "VITAMINB12", "FOLATE", "FERRITIN", "TIBC", "IRON", "RETICCTPCT" in the last 72 hours. Sepsis Labs: Recent Labs  Lab 10/28/22 0957  LATICACIDVEN 0.8    Recent Results (from the past 240 hour(s))  SARS Coronavirus 2 by RT PCR (hospital order, performed in [facility] hospital lab) *cepheid single result test* Anterior Nasal Swab     Status: None   Collection Time: 10/28/22 10:37 AM   Specimen: Anterior Nasal Swab  Result Value Ref Range Status   SARS Coronavirus 2 by RT PCR NEGATIVE NEGATIVE Final    Comment: (NOTE) SARS-CoV-2 target nucleic acids are NOT DETECTED.  The SARS-CoV-2 RNA is generally detectable in upper and lower respiratory specimens during the acute phase of infection. The lowest concentration of SARS-CoV-2 viral copies this assay can detect is 250 copies / mL. A negative result does not preclude SARS-CoV-2 infection and should not be used as the sole basis for treatment or other patient management decisions.  A negative result may occur with improper specimen collection / handling, submission of specimen other than nasopharyngeal swab, presence of viral mutation(s) within the areas targeted by this assay, and inadequate number of viral copies (<250 copies / mL). A negative result must be combined with clinical observations, patient history, and epidemiological information.  Fact Sheet for Patients:   RoadLapTop.co.za  Fact Sheet for Healthcare Providers: http://kim-miller.com/  This test is not yet approved or  cleared by the [place] FDA and has been authorized for detection and/or  diagnosis of SARS-CoV-2 by FDA under an Emergency Use Authorization (EUA).  This EUA will remain in effect (meaning this test can be used) for the duration of the COVID-19 declaration under Section 564(b)(1) of the Act, 21 U.S.C. section 360bbb-3(b)(1), unless the authorization is terminated or revoked sooner.  Performed at [facility] [address] [place], [place] [address]   Urine Culture     Status: Abnormal   Collection Time: 10/28/22  1:40 PM   Specimen: Urine, Catheterized  Result Value Ref Range Status   Specimen Description   Final    URINE, CATHETERIZED Performed at [facility], [address]  [place], [place] [address]    Special Requests   Final    NONE Performed at [facility], [address], [name], [place] [address]    Culture >=100,000 COLONIES/mL CITROBACTER AMALONATICUS (A)  Final   Report Status 10/30/2022 FINAL  Final   Organism ID, Bacteria CITROBACTER AMALONATICUS (A)  Final      Susceptibility   Citrobacter amalonaticus - MIC*    CEFEPIME <=0.12 SENSITIVE Sensitive     CEFTRIAXONE <=0.25 SENSITIVE Sensitive     CIPROFLOXACIN <=0.25 SENSITIVE Sensitive     GENTAMICIN <=1 SENSITIVE Sensitive     IMIPENEM <=0.25 SENSITIVE Sensitive     NITROFURANTOIN 64 INTERMEDIATE Intermediate     TRIMETH/SULFA <=20 SENSITIVE Sensitive     PIP/TAZO <=4 SENSITIVE Sensitive     * >=100,000 COLONIES/mL CITROBACTER AMALONATICUS  MRSA Next Gen by PCR, Nasal     Status: None   Collection Time: 10/29/22  6:55 PM   Specimen: Nasal Mucosa; Nasal Swab  Result Value Ref Range Status   MRSA by PCR Next Gen NOT DETECTED NOT DETECTED Final    Comment: (NOTE) The GeneXpert MRSA Assay (FDA approved for NASAL specimens only), is one component of a comprehensive MRSA colonization surveillance program. It is not intended to diagnose MRSA infection nor to guide or monitor treatment for MRSA infections. Test performance  is not FDA approved in patients less than 24 years old. Performed at [facility], [address], [place], [place] [address]          Radiology Studies: DG HIPS BILAT WITH PELVIS 2V  Result Date: 10/30/2022 CLINICAL DATA:  Pelvic pain. EXAM: DG HIP (WITH OR WITHOUT PELVIS) 2V BILAT COMPARISON:  None Available. FINDINGS: Technically suboptimal images. There is no evidence of hip fracture or dislocation. There is no evidence of arthropathy or other focal bone abnormality. IMPRESSION: Negative. Electronically Signed   By: Ted Mcalpine M.D.   On: 10/30/2022 18:22   DG Pelvis 1-2 Views  Result Date: 10/30/2022 CLINICAL DATA:  Pain to sacral area. EXAM: PELVIS - 1-2 VIEW COMPARISON:  Sep 12, 2022 FINDINGS: There is no evidence of pelvic fracture or diastasis. No pelvic bone lesions are seen. IMPRESSION: Negative. Electronically Signed   By: Ted Mcalpine M.D.   On: 10/30/2022 18:20   EEG adult  Result Date: 10/29/2022 Charlsie Quest, MD     10/29/2022  4:55 PM Patient Name: MEREDYTH MALLERY MRN: [id number] Epilepsy Attending: Charlsie Quest Referring Physician/Provider: Alwyn Ren, MD Date: 10/29/2022 Duration: 21.23 mins Patient history: 82yo F with h/o epilepsy now with ams. EEG to evaluate for seizure Level of alertness: Awake, asleep AEDs during EEG study: LEV, GBP Technical aspects: This EEG study was done with scalp electrodes positioned according to the 10-20 International system of electrode placement. Electrical activity was reviewed with band pass filter of 1-70Hz , sensitivity of 7 uV/mm, display speed of 60mm/sec with a 60Hz  notched filter applied as appropriate. EEG data were recorded continuously and digitally stored.  Video monitoring was available and reviewed as appropriate. Description: The posterior dominant rhythm consists of 8-9Hz  activity of moderate voltage (25-35 uV) seen predominantly in posterior head regions, symmetric and  reactive to eye opening and eye closing. Sleep was characterized by vertex waves, sleep spindles (12 to 14 Hz), maximal frontocentral region. There is also 15 to 18 Hz  beta activity distributed symmetrically and diffusely. Hyperventilation and photic stimulation were not performed.   IMPRESSION: This study is within normal  limits. No seizures or epileptiform discharges were seen throughout the recording. A normal interictal EEG does not exclude the diagnosis of epilepsy. Charlsie Quest   ECHOCARDIOGRAM COMPLETE  Result Date: 10/29/2022    ECHOCARDIOGRAM REPORT   Patient Name:   ARIEL LAUBENSTEIN Mwangi Date of Exam: 10/29/2022 Medical Rec #:  [id number]               Height:       66.0 in Accession #:    [id number]              Weight:       240.3 lb Date of Birth:  March 01, 1941               BSA:          2.162 m Patient Age:    81 years                BP:           157/89 mmHg Patient Gender: F                       HR:           50 bpm. Exam Location:  Inpatient Procedure: 2D Echo, Cardiac Doppler and Color Doppler Indications:    Abnormal ECG R94.31  History:        Patient has prior history of Echocardiogram examinations, most                 recent 01/28/2016. CHF, CAD, COPD; Risk Factors:Hypertension,                 Diabetes, Dyslipidemia and Current Smoker.  Sonographer:    Dondra Prader RVT Referring Phys: [id number] DAVID MANUEL ORTIZ  Sonographer Comments: Technically challenging study due to limited acoustic windows, Technically difficult study due to poor echo windows, suboptimal parasternal window, suboptimal apical window, suboptimal subcostal window and patient is obese. Image acquisition challenging due to patient body habitus, Image acquisition challenging due to respiratory motion, Image acquisition challenging due to uncooperative patient and Image acquisition challenging due to COPD. Patient unable to tolerate exam. Exam ended. IMPRESSIONS  1. Overall poor image quality.  2. Left ventricular  ejection fraction, by estimation, is 55 to 60%. The left ventricle has normal function. The left ventricle has no regional wall motion abnormalities. There is mild left ventricular hypertrophy. Left ventricular diastolic function could not be evaluated.  3. Right ventricular systolic function is normal. The right ventricular size is normal.  4. The mitral valve was not well visualized. No evidence of mitral valve regurgitation. Moderate mitral annular calcification.  5. The aortic valve was not well visualized. There is mild calcification of the aortic valve. There is mild thickening of the aortic valve. Aortic valve regurgitation is not visualized. Aortic valve sclerosis is present, with no evidence of aortic valve  stenosis. FINDINGS  Left Ventricle: Left ventricular ejection fraction, by estimation, is 55 to 60%. The left ventricle has normal function. The left ventricle has no regional wall motion abnormalities. The left ventricular internal cavity size was normal in size. There is  mild left ventricular hypertrophy. Left ventricular diastolic function could not be evaluated. Right Ventricle: The right ventricular size is normal. Right vetricular wall thickness was not assessed. Right ventricular systolic function is normal. Left Atrium: Left atrial size was not well visualized. Right Atrium: Right atrial size was not well visualized. Pericardium: There is no evidence of pericardial effusion.  Mitral Valve: The mitral valve was not well visualized. There is mild thickening of the mitral valve leaflet(s). There is mild calcification of the mitral valve leaflet(s). Moderate mitral annular calcification. No evidence of mitral valve regurgitation. Tricuspid Valve: The tricuspid valve is not well visualized. Tricuspid valve regurgitation is trivial. Aortic Valve: The aortic valve was not well visualized. There is mild calcification of the aortic valve. There is mild thickening of the aortic valve. Aortic valve  regurgitation is not visualized. Aortic valve sclerosis is present, with no evidence of aortic valve stenosis. Aortic valve mean gradient measures 3.0 mmHg. Aortic valve peak gradient measures 6.5 mmHg. Aortic valve area, by VTI measures 1.85 cm. Pulmonic Valve: The pulmonic valve was normal in structure. Pulmonic valve regurgitation is trivial. Aorta: The aortic root is normal in size and structure. IAS/Shunts: The interatrial septum was not well visualized. Additional Comments: Overall poor image quality.  LEFT VENTRICLE PLAX 2D LVIDd:         4.40 cm LVIDs:         3.00 cm LV PW:         1.30 cm LV IVS:        1.30 cm LVOT diam:     1.80 cm LV SV:         53 LV SV Index:   25 LVOT Area:     2.54 cm  LEFT ATRIUM           Index LA diam:      3.40 cm 1.57 cm/m LA Vol (A4C): 30.4 ml 14.06 ml/m  AORTIC VALVE                    PULMONIC VALVE AV Area (Vmax):    1.78 cm     PV Vmax:       0.95 m/s AV Area (Vmean):   1.80 cm     PV Peak grad:  3.6 mmHg AV Area (VTI):     1.85 cm AV Vmax:           127.00 cm/s AV Vmean:          84.700 cm/s AV VTI:            0.288 m AV Peak Grad:      6.5 mmHg AV Mean Grad:      3.0 mmHg LVOT Vmax:         88.70 cm/s LVOT Vmean:        60.000 cm/s LVOT VTI:          0.209 m LVOT/AV VTI ratio: 0.73  AORTA Ao Root diam: 2.70 cm MITRAL VALVE MV Area (PHT): 2.80 cm    SHUNTS MV Decel Time: 271 msec    Systemic VTI:  0.21 m MV E velocity: 75.80 cm/s  Systemic Diam: 1.80 cm MV A velocity: 78.80 cm/s MV E/A ratio:  0.96 Charlton Haws MD Electronically signed by Charlton Haws MD Signature Date/Time: 10/29/2022/12:39:17 PM    Final         Scheduled Meds:  amLODipine  5 mg Oral Daily   aspirin EC  81 mg Oral Daily   Chlorhexidine Gluconate Cloth  6 each Topical Daily   clopidogrel  75 mg Oral Daily   enoxaparin (LOVENOX) injection  40 mg Subcutaneous Q24H   feeding supplement  237 mL Oral BID BM   furosemide  20 mg Intravenous Daily   gabapentin  300 mg Oral BID   hydrALAZINE   25 mg Oral  Q8H   insulin aspart  0-15 Units Subcutaneous Q4H   potassium chloride  20 mEq Oral Daily   Continuous Infusions:  sodium chloride 10 mL/hr at 10/31/22 0954   acetaminophen Stopped (10/31/22 0524)   cefTRIAXone (ROCEPHIN)  IV Stopped (10/30/22 1526)   dextrose 25 mL/hr at 10/31/22 0954   levETIRAcetam Stopped (10/31/22 0927)     LOS: 2 days    Time spent: 39 min Alwyn Ren, MD 10/31/2022, 11:39 AM

## 2022-11-01 DIAGNOSIS — G9341 Metabolic encephalopathy: Secondary | ICD-10-CM | POA: Diagnosis not present

## 2022-11-01 LAB — CBC
HCT: 34.4 % — ABNORMAL LOW (ref 36.0–46.0)
Hemoglobin: 10.7 g/dL — ABNORMAL LOW (ref 12.0–15.0)
MCH: 29.8 pg (ref 26.0–34.0)
MCHC: 31.1 g/dL (ref 30.0–36.0)
MCV: 95.8 fL (ref 80.0–100.0)
Platelets: 228 10*3/uL (ref 150–400)
RBC: 3.59 MIL/uL — ABNORMAL LOW (ref 3.87–5.11)
RDW: 13.2 % (ref 11.5–15.5)
WBC: 7.2 10*3/uL (ref 4.0–10.5)
nRBC: 0 % (ref 0.0–0.2)

## 2022-11-01 LAB — COMPREHENSIVE METABOLIC PANEL
ALT: 11 U/L (ref 0–44)
AST: 13 U/L — ABNORMAL LOW (ref 15–41)
Albumin: 2.7 g/dL — ABNORMAL LOW (ref 3.5–5.0)
Alkaline Phosphatase: 96 U/L (ref 38–126)
Anion gap: 8 (ref 5–15)
BUN: 13 mg/dL (ref 8–23)
CO2: 28 mmol/L (ref 22–32)
Calcium: 8.7 mg/dL — ABNORMAL LOW (ref 8.9–10.3)
Chloride: 101 mmol/L (ref 98–111)
Creatinine, Ser: 0.73 mg/dL (ref 0.44–1.00)
GFR, Estimated: >60 mL/min (ref >60)
Glucose, Bld: 122 mg/dL — ABNORMAL HIGH (ref 70–99)
Potassium: 4.3 mmol/L (ref 3.5–5.1)
Sodium: 137 mmol/L (ref 135–145)
Total Bilirubin: 0.5 mg/dL (ref 0.3–1.2)
Total Protein: 6.2 g/dL — ABNORMAL LOW (ref 6.5–8.1)

## 2022-11-01 LAB — GLUCOSE, CAPILLARY
Glucose-Capillary: 111 mg/dL — ABNORMAL HIGH (ref 70–99)
Glucose-Capillary: 113 mg/dL — ABNORMAL HIGH (ref 70–99)
Glucose-Capillary: 140 mg/dL — ABNORMAL HIGH (ref 70–99)
Glucose-Capillary: 156 mg/dL — ABNORMAL HIGH (ref 70–99)
Glucose-Capillary: 167 mg/dL — ABNORMAL HIGH (ref 70–99)
Glucose-Capillary: 185 mg/dL — ABNORMAL HIGH (ref 70–99)

## 2022-11-01 MED ORDER — METOPROLOL TARTRATE 25 MG PO TABS
12.5000 mg | ORAL_TABLET | Freq: Two times a day (BID) | ORAL | Status: DC
  Administered 2022-11-01 – 2022-11-05 (×8): 12.5 mg via ORAL
  Filled 2022-11-01 (×9): qty 1

## 2022-11-01 MED ORDER — ADULT MULTIVITAMIN W/MINERALS CH
1.0000 | ORAL_TABLET | Freq: Every day | ORAL | Status: DC
  Administered 2022-11-02 – 2022-11-05 (×4): 1 via ORAL
  Filled 2022-11-01 (×4): qty 1

## 2022-11-01 NOTE — Progress Notes (Signed)
Pt was placed on BIPAP  by RN due to AMS.

## 2022-11-01 NOTE — Progress Notes (Signed)
PROGRESS NOTE    [place]  WGN:[id number] DOB: October 31, 1940 DOA: 10/28/2022 PCP: System, Provider Not In   Brief Narrative: [name] is a 82 y.o. female with medical history significant of allergic rhinitis, osteoarthritis, history of CVA with residual right-sided hemiparesis, chronic diastolic heart failure, cholelithiasis, chronic back pain, liver cirrhosis, colon polyps, COPD, depression, type 2 diabetes, diverticulosis, fatty liver, history of liver cirrhosis, gastritis, gastroparesis, GERD, history of headaches, hyperlipidemia, hypertension, CAD, history of MI, diabetic neuropathy, class II obesity, OSA not on CPAP, osteoporosis, seizure disorder who was brought to the emergency department due to generalized pain and altered mental status with hallucinations for the past 4 days, but they seem to be significantly have been getting worse.  She has been seeing things at home that was not there. She also has been following with Dr. 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   No fever, but has been having myalgias and nonproductive cough.  She has been having hallucinations.  She was sent to the stepdown unit on 7/12 as her mental status was getting worse and not able to respond to commands or questions.  She was moved to stepdown unit placed on BiPAP ABG consistent with hypercapnia.  She was on BiPAP for day and night till the following day was sticking out and placed her on nasal cannula.   Lab work: Urinalysis was hazy with ketones of 5 and protein of 100 mg/dL.  Negative nitrites and leukocyte esterase.  There were many bacteria.  The rest of the urine analysis was normal.  UDS was negative.  Her CBC showed a white count of 7.0, hemoglobin 11.2 g/dL platelets 784.  Unremarkable PT and INR.  Negative coronavirus PCR.  Lactic acid, ammonia, troponin, total CK, alcohol level and BNP were normal.  CMP with normal electrolytes after  calcium correction.  AST 12 units/L, albumin 3.2 g/dL and glucose 696 mg/dL.  The rest of the LFTs and renal function were normal.   Imaging: Two-view portable chest radiograph with suspicious right upper lobe lung nodule as demonstrated on cervical CT on 09/12/2022.  Left femur x-ray with no acute fracture or dislocation.  CT head without contrast with no acute intracranial abnormality.   ED course: Initial vital signs were temperature 97.9 F, pulse 79, respiration 20, BP 156/102 mmHg and O2 sat 97% on room air.  The patient received fentanyl 50 mcg IVP x 2, LR 500 mL bolus and ceftriaxone 1 g IVPB.    Assessment & Plan:   Principal Problem:   Acute metabolic encephalopathy Active Problems:   COPD   GERD   Hepatic cirrhosis (HCC)   Gastroparesis   Chronic diastolic CHF (congestive heart failure) (HCC)   Vomiting   Essential hypertension   HLD (hyperlipidemia)   Sleep disorder   Type 2 diabetes mellitus (HCC)   Epilepsy (HCC)   CAD (coronary artery disease)   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)  #1 Acute metabolic encephalopathy- likely due to to multiple causes including hypercapnia ?  Seizures ?  UTI,?  Acute urinary retention .Seizures were ruled out with EEG.  Patient has history of seizure disorder and is on Keppra and gabapentin. Urine culture with Citrobacter UTI on Rocephin She was hypercapnic and was placed on BiPAP with improvement in her gas and mental status. Patient had Foley catheter placed for acute urinary retention. With all the above treatment her mental status  improved and was back to baseline per family.-patient was admitted with complaints of hallucination and confusion.  At baseline she lives at home does not walk much her daughter lives next door and takes care of the mother. patient has history of obstructive sleep apnea but does not use his CPAP at home She was retaining urine with placement of Foley over 1 L. PT eval  pending.   #2 acute hypercapnic respiratory failure in the setting of COPD obstructive sleep apnea-patient also continues to smoke currently and has been smoking for over 70 years. BiPAP nightly and as needed  #3 history of epilepsy on Keppra and gabapentin  #4 acute urinary retention with Citrobacter UTI -on Rocephin  Foley placed in the ER  #5 history of  Cirrhosis/gastroparesis-  followed by [facility] as outpatient.  #6 chronic diastolic heart failure stable follow-up echo ejection fraction 55 to 60%  #7 essential hypertension-her blood pressure remains elevated on Norvasc 5 mg daily, Lasix 20 mg daily, hydralazine 25 mg every 8 hours. Hydralazine was added during the hospital stay. Lopressor 12.5 mg bid 7/15 Monitor closely and adjust the doses as needed.  #8 hyperlipidemia on atorvastatin  #9 type 2 diabetes continue SSI  #10 morbid obesity complicates overall prognosis and management  #11 history of stroke with hemiparesis continue statin and aspirin and plavix  #12 right upper lobe lung nodule saw Dr. Arbutus Ped on 10/14/2022 who recommended PET scan and follow-up with him in 3 weeks.  Estimated body mass index is 38.79 kg/m as calculated from the following:   Height as of this encounter: 5\' 6"  (1.676 m).   Weight as of this encounter: 109 kg.  DVT prophylaxis: Lovenox  code Status: DNR family Communication: Daughter at the side  disposition Plan:  Status is: Inpatient Consultants:  None  Procedures: None Antimicrobials: None  Subjective: Patient was awake today on nasal cannula oxygen daughter by the bedside  objective: Vitals:   11/01/22 0943 11/01/22 1000 11/01/22 1100 11/01/22 1108  BP: (!) 173/74 (!) 170/87    Pulse: 79  78   Resp: (!) 23 (!) 25 (!) 28   Temp:    99.6 F (37.6 C)  TempSrc:    Oral  SpO2: 100% 93% 94%   Weight:      Height:        Intake/Output Summary (Last 24 hours) at 11/01/2022 1131 Last data filed at 11/01/2022 1107 Gross per 24  hour  Intake 960.25 ml  Output 4625 ml  Net -3664.75 ml   Filed Weights   10/28/22 0926  Weight: 109 kg    Examination:  General exam: Appears chronically ill-appearing  respiratory system: Diminished to auscultation. Respiratory effort normal. Cardiovascular system: S1 & S2 heard, RRR. No JVD, murmurs, rubs, gallops or clicks. No pedal edema. Gastrointestinal system: Abdomen is distended, soft and nontender. No organomegaly or masses felt. Normal bowel sounds heard. Central nervous system: On bipap Extremities: Edema 1+  Data Reviewed: I have personally reviewed following labs and imaging studies  CBC: Recent Labs  Lab 10/28/22 0957 10/29/22 0714 10/30/22 0258 11/01/22 0316  WBC 7.0 5.5 4.9 7.2  NEUTROABS 4.4  --  2.6  --   HGB 11.2* 10.5* 9.8* 10.7*  HCT 36.0 33.9* 32.3* 34.4*  MCV 96.5 98.0 100.0 95.8  PLT 250 224 193 228   Basic Metabolic Panel: Recent Labs  Lab 10/28/22 0957 10/29/22 0714 10/30/22 0258 11/01/22 0316  NA 137 137 138 137  K 3.8 3.7 3.7 4.3  CL 102 102 104 101  CO2 28 29 28 28   GLUCOSE 155* 122* 80 122*  BUN 23 17 16 13   CREATININE 0.94 0.79 0.80 0.73  CALCIUM 8.7* 8.0* 8.1* 8.7*   GFR: Estimated Creatinine Clearance: 70.1 mL/min (by C-G formula based on SCr of 0.73 mg/dL). Liver Function Tests: Recent Labs  Lab 10/28/22 0957 10/29/22 0714 11/01/22 0316  AST 12* 11* 13*  ALT 9 7 11   ALKPHOS 105 88 96  BILITOT 0.5 0.5 0.5  PROT 7.1 5.6* 6.2*  ALBUMIN 3.2* 2.5* 2.7*   No results for input(s): "LIPASE", "AMYLASE" in the last 168 hours. Recent Labs  Lab 10/28/22 0957  AMMONIA 20   Coagulation Profile: Recent Labs  Lab 10/28/22 0957  INR 1.0   Cardiac Enzymes: Recent Labs  Lab 10/28/22 0957  CKTOTAL 161   BNP (last 3 results) No results for input(s): "PROBNP" in the last 8760 hours. HbA1C: No results for input(s): "HGBA1C" in the last 72 hours.  CBG: Recent Labs  Lab 10/31/22 2003 10/31/22 2357 11/01/22 0440  11/01/22 0743 11/01/22 1103  GLUCAP 133* 118* 111* 140* 185*   Lipid Profile: No results for input(s): "CHOL", "HDL", "LDLCALC", "TRIG", "CHOLHDL", "LDLDIRECT" in the last 72 hours. Thyroid Function Tests: No results for input(s): "TSH", "T4TOTAL", "FREET4", "T3FREE", "THYROIDAB" in the last 72 hours. Anemia Panel: No results for input(s): "VITAMINB12", "FOLATE", "FERRITIN", "TIBC", "IRON", "RETICCTPCT" in the last 72 hours. Sepsis Labs: Recent Labs  Lab 10/28/22 0957  LATICACIDVEN 0.8    Recent Results (from the past 240 hour(s))  SARS Coronavirus 2 by RT PCR (hospital order, performed in [facility] hospital lab) *cepheid single result test* Anterior Nasal Swab     Status: None   Collection Time: 10/28/22 10:37 AM   Specimen: Anterior Nasal Swab  Result Value Ref Range Status   SARS Coronavirus 2 by RT PCR NEGATIVE NEGATIVE Final    Comment: (NOTE) SARS-CoV-2 target nucleic acids are NOT DETECTED.  The SARS-CoV-2 RNA is generally detectable in upper and lower respiratory specimens during the acute phase of infection. The lowest concentration of SARS-CoV-2 viral copies this assay can detect is 250 copies / mL. A negative result does not preclude SARS-CoV-2 infection and should not be used as the sole basis for treatment or other patient management decisions.  A negative result may occur with improper specimen collection / handling, submission of specimen other than nasopharyngeal swab, presence of viral mutation(s) within the areas targeted by this assay, and inadequate number of viral copies (<250 copies / mL). A negative result must be combined with clinical observations, patient history, and epidemiological information.  Fact Sheet for Patients:   RoadLapTop.co.za  Fact Sheet for Healthcare Providers: http://kim-miller.com/  This test is not yet approved or  cleared by the [place] FDA and has been authorized for  detection and/or diagnosis of SARS-CoV-2 by FDA under an Emergency Use Authorization (EUA).  This EUA will remain in effect (meaning this test can be used) for the duration of the COVID-19 declaration under Section 564(b)(1) of the Act, 21 U.S.C. section 360bbb-3(b)(1), unless the authorization is terminated or revoked sooner.  Performed at [facility], [address] [place], [place] [address]   Urine Culture     Status: Abnormal   Collection Time: 10/28/22  1:40 PM   Specimen: Urine, Catheterized  Result Value Ref Range Status   Specimen Description   Final    URINE, CATHETERIZED Performed at [facility], [address] [place], [place] [address]    Special Requests   Final    NONE Performed at [facility], [address], [place], [place] [address]    Culture >=100,000 COLONIES/mL CITROBACTER AMALONATICUS (A)  Final   Report Status 10/30/2022 FINAL  Final   Organism ID, Bacteria CITROBACTER AMALONATICUS (A)  Final      Susceptibility   Citrobacter amalonaticus - MIC*    CEFEPIME <=0.12 SENSITIVE Sensitive     CEFTRIAXONE <=0.25 SENSITIVE Sensitive     CIPROFLOXACIN <=0.25 SENSITIVE Sensitive     GENTAMICIN <=1 SENSITIVE Sensitive     IMIPENEM <=0.25 SENSITIVE Sensitive     NITROFURANTOIN 64 INTERMEDIATE Intermediate     TRIMETH/SULFA <=20 SENSITIVE Sensitive     PIP/TAZO <=4 SENSITIVE Sensitive     * >=100,000 COLONIES/mL CITROBACTER AMALONATICUS  MRSA Next Gen by PCR, Nasal     Status: None   Collection Time: 10/29/22  6:55 PM   Specimen: Nasal Mucosa; Nasal Swab  Result Value Ref Range Status   MRSA by PCR Next Gen NOT DETECTED NOT DETECTED Final    Comment: (NOTE) The GeneXpert MRSA Assay (FDA approved for NASAL specimens only), is one component of a comprehensive MRSA colonization surveillance program. It is not intended to diagnose MRSA infection nor to guide or monitor treatment for MRSA  infections. Test performance is not FDA approved in patients less than 12 years old. Performed at [facility], [address] [place], [place] [address]          Radiology Studies: DG HIPS BILAT WITH PELVIS 2V  Result Date: 10/30/2022 CLINICAL DATA:  Pelvic pain. EXAM: DG HIP (WITH OR WITHOUT PELVIS) 2V BILAT COMPARISON:  None Available. FINDINGS: Technically suboptimal images. There is no evidence of hip fracture or dislocation. There is no evidence of arthropathy or other focal bone abnormality. IMPRESSION: Negative. Electronically Signed   By: Ted Mcalpine M.D.   On: 10/30/2022 18:22   DG Pelvis 1-2 Views  Result Date: 10/30/2022 CLINICAL DATA:  Pain to sacral area. EXAM: PELVIS - 1-2 VIEW COMPARISON:  Sep 12, 2022 FINDINGS: There is no evidence of pelvic fracture or diastasis. No pelvic bone lesions are seen. IMPRESSION: Negative. Electronically Signed   By: Ted Mcalpine M.D.   On: 10/30/2022 18:20        Scheduled Meds:  amLODipine  5 mg Oral Daily   aspirin EC  81 mg Oral Daily   Chlorhexidine Gluconate Cloth  6 each Topical Daily   clopidogrel  75 mg Oral Daily   enoxaparin (LOVENOX) injection  40 mg Subcutaneous Q24H   feeding supplement  237 mL Oral BID BM   furosemide  20 mg Intravenous Daily   gabapentin  300 mg Oral BID   hydrALAZINE  25 mg Oral Q8H   insulin aspart  0-15 Units Subcutaneous Q4H   levETIRAcetam  500 mg Oral BID   potassium chloride  20 mEq Oral Daily   Continuous Infusions:  sodium chloride Stopped (10/31/22 1723)   cefTRIAXone (ROCEPHIN)  IV Stopped (10/31/22 1551)     LOS: 3 days    Time spent: 39 min Alwyn Ren, MD 11/01/2022, 11:31 AM

## 2022-11-01 NOTE — Progress Notes (Signed)
Initial Nutrition Assessment  DOCUMENTATION CODES:   Obesity unspecified  INTERVENTION:  - Full Liquid diet per MD. Advance as medically appropriate.  - Ensure Plus High Protein po BID, each supplement provides 350 kcal and 20 grams of protein. - Multivitamin with minerals daily - Monitor weight trends.   NUTRITION DIAGNOSIS:   Increased nutrient needs related to chronic illness as evidenced by estimated needs  GOAL:   Patient will meet greater than or equal to 90% of their needs  MONITOR:   PO intake, Diet advancement, Weight trends  REASON FOR ASSESSMENT:   Malnutrition Screening Tool    ASSESSMENT:   82 y.o. female with PMH significant of history of CVA with residual right-sided hemiparesis, chronic diastolic heart failure, cholelithiasis, liver cirrhosis, COPD, type 2 diabetes, diverticulosis, gastroparesis, HLD, HTN, CAD, history of MI who presented due to generalized pain and altered mental status with hallucinations. Admtted for acute metabolic encephalopahthy.  Patient in bed at time of visit, family member at bedside.   UBW reported to be 240# and family members shares that patient fluctuates up and down but no major changes in weight recently.  Per EMR, weights have varied widely over the past year (185-295#). However, patient admitted at 240#.   Patient eats around 3 meals a day at home. Family member states her intake goes up and down depending on the day. No chewing or swallowing issues PTA.   Patient states she is currently hungry and has a good appetite. Only on full liquids and states she wants to try more solid food however admits that she got choked on some grits this morning. No intake documented since admit. Thankfully, she loves Ensure and has been drinking.   Medications reviewed and include: Lasix  Labs reviewed:  HA1C 6.7   NUTRITION - FOCUSED PHYSICAL EXAM:  Flowsheet Row Most Recent Value  Orbital Region No depletion  Upper Arm Region No  depletion  Thoracic and Lumbar Region No depletion  Buccal Region No depletion  Clavicle Bone Region No depletion  Clavicle and Acromion Bone Region No depletion  Scapular Bone Region Unable to assess  Dorsal Hand No depletion  Patellar Region No depletion  Anterior Thigh Region No depletion  Posterior Calf Region No depletion  Edema (RD Assessment) Mild  Hair Reviewed  Eyes Reviewed  Mouth Reviewed  Skin Reviewed  Nails Reviewed       Diet Order:   Diet Order             Diet full liquid Room service appropriate? Yes; Fluid consistency: Thin  Diet effective now                   EDUCATION NEEDS:  Education needs have been addressed  Skin:  Skin Assessment: Reviewed RN Assessment  Last BM:  7/15  Height:  Ht Readings from Last 1 Encounters:  10/28/22 5\' 6"  (1.676 m)   Weight:  Wt Readings from Last 1 Encounters:  10/28/22 109 kg   Ideal Body Weight:  59.09 kg  BMI:  Body mass index is 38.79 kg/m.  Estimated Nutritional Needs:  Kcal:  1750-1900 kcals Protein:  70-85 grams Fluid:  >/= 1.7L    Shelle Iron RD, LDN For contact information, refer to [facility].

## 2022-11-02 ENCOUNTER — Ambulatory Visit (HOSPITAL_COMMUNITY): Payer: Medicare PPO

## 2022-11-02 ENCOUNTER — Encounter (HOSPITAL_COMMUNITY): Payer: Self-pay | Admitting: Pulmonary Disease

## 2022-11-02 DIAGNOSIS — G9341 Metabolic encephalopathy: Secondary | ICD-10-CM | POA: Diagnosis not present

## 2022-11-02 DIAGNOSIS — G4730 Sleep apnea, unspecified: Secondary | ICD-10-CM | POA: Diagnosis not present

## 2022-11-02 DIAGNOSIS — J4489 Other specified chronic obstructive pulmonary disease: Secondary | ICD-10-CM | POA: Diagnosis not present

## 2022-11-02 DIAGNOSIS — R911 Solitary pulmonary nodule: Secondary | ICD-10-CM

## 2022-11-02 DIAGNOSIS — J9621 Acute and chronic respiratory failure with hypoxia: Secondary | ICD-10-CM

## 2022-11-02 DIAGNOSIS — Z720 Tobacco use: Secondary | ICD-10-CM

## 2022-11-02 DIAGNOSIS — J9622 Acute and chronic respiratory failure with hypercapnia: Secondary | ICD-10-CM

## 2022-11-02 LAB — GLUCOSE, CAPILLARY
Glucose-Capillary: 107 mg/dL — ABNORMAL HIGH (ref 70–99)
Glucose-Capillary: 111 mg/dL — ABNORMAL HIGH (ref 70–99)
Glucose-Capillary: 152 mg/dL — ABNORMAL HIGH (ref 70–99)
Glucose-Capillary: 170 mg/dL — ABNORMAL HIGH (ref 70–99)
Glucose-Capillary: 171 mg/dL — ABNORMAL HIGH (ref 70–99)
Glucose-Capillary: 173 mg/dL — ABNORMAL HIGH (ref 70–99)

## 2022-11-02 MED ORDER — NYSTATIN 100000 UNIT/ML MT SUSP
5.0000 mL | Freq: Four times a day (QID) | OROMUCOSAL | Status: DC
  Administered 2022-11-02 – 2022-11-04 (×11): 500000 [IU] via ORAL
  Filled 2022-11-02 (×12): qty 5

## 2022-11-02 MED ORDER — UMECLIDINIUM-VILANTEROL 62.5-25 MCG/ACT IN AEPB
1.0000 | INHALATION_SPRAY | Freq: Every day | RESPIRATORY_TRACT | Status: DC
  Administered 2022-11-03 – 2022-11-05 (×3): 1 via RESPIRATORY_TRACT
  Filled 2022-11-02: qty 14

## 2022-11-02 MED ORDER — NICOTINE 14 MG/24HR TD PT24
14.0000 mg | MEDICATED_PATCH | Freq: Every day | TRANSDERMAL | Status: DC
  Administered 2022-11-02 – 2022-11-05 (×4): 14 mg via TRANSDERMAL
  Filled 2022-11-02 (×4): qty 1

## 2022-11-02 NOTE — TOC Progression Note (Addendum)
Transition of Care [facility]) - Progression Note    Patient Details  Name: [name] MRN: [id number] Date of Birth: 1940/10/16  [facility] CM/SW Contact  Lavenia Atlas, RN Phone Number: 11/02/2022, 2:24 PM  Clinical Narrative:   Received TOC consult for DME: [facility]. Per chart review patient has dementia,  PT recommends short term SNF. This RNCM spoke with patient's daughter [name] who reports patient has a HHA w/ [facility]. Patient's family is interested in patient going to short term SNF. Offered choice for DME: [facility], family does not have a preference. This RNCM notified Jermaine with [employer], who will review for [facility]. Awaiting response from [facility] w/[employer].  PASRR#[phone]  TOC will continue to follow.    Expected Discharge Plan: Home/Self Care Barriers to Discharge: Continued Medical Work up  Expected Discharge Plan and Services   Discharge Planning Services: CM Consult Post Acute Care Choice: Resumption of Svcs/PTA Provider ([name]) Living arrangements for the past 2 months: Apartment                                       Social Determinants of Health (SDOH) Interventions SDOH Screenings   Food Insecurity: No Food Insecurity (10/31/2022)  Housing: Low Risk  (10/31/2022)  Transportation Needs: No Transportation Needs (10/31/2022)  Utilities: Not At Risk (10/31/2022)  Alcohol Screen: Low Risk  (06/07/2022)  Physical Activity: Inactive (06/07/2022)  Tobacco Use: High Risk (10/28/2022)    Readmission Risk Interventions    11/02/2022    2:16 PM  Readmission Risk Prevention Plan  Transportation Screening Complete  PCP or Specialist Appt within 5-7 Days Complete  Home Care Screening Complete  Medication Review (RN CM) Complete

## 2022-11-02 NOTE — Evaluation (Signed)
Physical Therapy Evaluation Patient Details Name: [name] MRN: [id number] DOB: 06-Oct-1940 Today's Date: 11/02/2022  History of Present Illness  82 yo female presents to therapy s/p hospital admission on 10/28/2022 due to generalized pain, AMS including hallucinations, progressive unintentional weight loss with episodes of vomiting and a nonproductive cough. Pt was found to have abn labs and dx of acute metabolic encephalopathy with suspect UTI. Imaging revealed R UL nodule, L LE no acute findings and CT of head with no acute abnormalities. 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 Pt has PMH including but not limited to: seizures, COPD, GERD, hepatic cirrhosis, gastroparesis, dCHF, HTN, HLD, DM II, CAD, OSA, and MI.  Clinical Impression    Pt admitted with above diagnosis.  Pt currently with functional limitations due to the deficits listed below (see PT Problem List). Pt presented to therapy in bed, pt agreeable to therapy intervention. Pt and daughter whom arrived during therapy session able to provide insight to PLOF, per report pt required assist for all functional mobility and ADL tasks at baseline. Pt required max A for supine to sit EOB, max A x 2 for sit to stand from EOB, max A x 2 for sit to supine and total assist to reposition in bed. Pt limited by fatigue. Pt will require ongoing therapy services and inpatient follow up therapy, <3 hours/day, discussed at eval with daughter and pt and in agreement not safe to transition home at this time and open to SNF.  Pt will benefit from acute skilled PT in current setting to increase their independence and safety with mobility to allow discharge to the next venue.         Assistance Recommended at Discharge Frequent or constant Supervision/Assistance  If plan is discharge home, recommend the following:  Can travel by private vehicle  Two people to help with walking and/or transfers;A lot of  help with bathing/dressing/bathroom;Assistance with cooking/housework;Direct supervision/assist for medications management;Direct supervision/assist for financial management;Assist for transportation;Help with stairs or ramp for entrance   No    Equipment Recommendations None recommended by PT  Recommendations for Other Services       Functional Status Assessment Patient has had a recent decline in their functional status and demonstrates the ability to make significant improvements in function in a reasonable and predictable amount of time.     Precautions / Restrictions Precautions Precautions: Fall Restrictions Weight Bearing Restrictions: No RLE Weight Bearing: Weight bearing as tolerated      Mobility  Bed Mobility Overal bed mobility: Needs Assistance Bed Mobility: Supine to Sit, Sit to Supine     Supine to sit: Max assist, HOB elevated Sit to supine: Max assist, +2 for physical assistance, +2 for safety/equipment   General bed mobility comments: total A to reposition in bed, max A x 2 for lateral scoot tward HOB. pt requires increased time for motor processing and planning and is limited with IND with functional mobility tasks due to limited mobility at baseline    Transfers Overall transfer level: Needs assistance Equipment used: Rolling walker (2 wheels) Transfers: Sit to/from Stand Sit to Stand: Max assist, +2 physical assistance, +2 safety/equipment, From elevated surface           General transfer comment: pull to stand from EOB with cues for encouragement and multimodal cues for power up and extension posture, once in standing pt required mod A to maintain at [facility] for ~1:30  Ambulation/Gait               General Gait Details: NT for pt and staff safety due to limited to no amb at baseline and extensive assist for bed mobility and sit to stand  Stairs            Wheelchair Mobility     Tilt Bed    Modified Rankin (Stroke Patients  Only)       Balance Overall balance assessment: Needs assistance, History of Falls (daughter indicates > 10 falls in the past 6 months) Sitting-balance support: Feet supported, Bilateral upper extremity supported Sitting balance-Leahy Scale: Fair     Standing balance support: Bilateral upper extremity supported, During functional activity, Reliant on assistive device for balance Standing balance-Leahy Scale: Zero                               Pertinent Vitals/Pain Pain Assessment Pain Assessment: Faces Faces Pain Scale: Hurts even more Pain Location: B LE and B shoulders Pain Descriptors / Indicators: Aching, Discomfort, Grimacing Pain Intervention(s): Limited activity within patient's tolerance, Monitored during session, Repositioned    Home Living Family/patient expects to be discharged to:: Private residence Living Arrangements: Children Available Help at Discharge: Family;Personal care attendant Type of Home: Apartment Home Access: Level entry       Home Layout: One level Home Equipment: Agricultural consultant (2 wheels);Electric scooter;Wheelchair - manual      Prior Function Prior Level of Function : Needs assist;History of Falls (last six months)       Physical Assist : Mobility (physical);ADLs (physical) Mobility (physical): Transfers;Gait ADLs (physical): Bathing;Toileting;IADLs;Dressing Mobility Comments: Daughter indicates limited mobility at baseline with assist for transfer tasks with use of RW to manual wc, daughter indicated the scooter was no longer functioning. ADLs Comments: pt requried A for ADLs, self care tasks, meal prep, IADLs, strong family and social support, son lives with pt and daughter is close. pt has personal aid 2 hrs/day x 7 days a week     Hand Dominance        Extremity/Trunk Assessment   Upper Extremity Assessment Upper Extremity Assessment: Generalized weakness (B shoulder ROM and mm deficits)    Lower Extremity  Assessment Lower Extremity Assessment: Generalized weakness (B LE edema. late effects CVA with mild deficits noted  R side)    Cervical / Trunk Assessment Cervical / Trunk Assessment: Kyphotic (head forward)  Communication   Communication: No difficulties  Cognition Arousal/Alertness: Awake/alert Behavior During Therapy: WFL for tasks assessed/performed Overall Cognitive Status: Impaired/Different from baseline Area of Impairment: Safety/judgement, Awareness (pt oriented to self, daughter, location and date)                         Safety/Judgement: Decreased awareness of deficits              General Comments      Exercises     Assessment/Plan    PT Assessment Patient needs continued PT services  PT Problem List Decreased strength;Decreased range of motion;Decreased activity tolerance;Decreased balance;Decreased mobility;Decreased coordination;Decreased knowledge of precautions;Cardiopulmonary status limiting activity;Pain       PT Treatment Interventions DME instruction;Gait training;Functional mobility training;Therapeutic activities;Therapeutic exercise;Balance training;Neuromuscular re-education;Patient/family education    PT Goals (Current goals can be found in the Care Plan section)  Acute Rehab PT Goals Patient Stated Goal: to be able to go from the bed to wc to transition home  PT Goal Formulation: With patient Time For Goal Achievement: 11/16/22 Potential to Achieve Goals: Fair    Frequency Min 1X/week     Co-evaluation               AM-PAC PT "6 Clicks" Mobility  Outcome Measure Help needed turning from your back to your side while in a flat bed without using bedrails?: A Lot Help needed moving from lying on your back to sitting on the side of a flat bed without using bedrails?: A Lot Help needed moving to and from a bed to a chair (including a wheelchair)?: A Lot Help needed standing up from a chair using your arms (e.g., wheelchair or  bedside chair)?: A Lot Help needed to walk in hospital room?: Total Help needed climbing 3-5 steps with a railing? : Total 6 Click Score: 10    End of Session Equipment Utilized During Treatment: Gait belt;Oxygen (2 L/min no supplemental O2 at baseline) Activity Tolerance: Patient limited by fatigue;Patient limited by pain Patient left: in bed;with call bell/phone within reach;with family/visitor present Nurse Communication: Mobility status PT Visit Diagnosis: Unsteadiness on feet (R26.81);Repeated falls (R29.6);Muscle weakness (generalized) (M62.81);Difficulty in walking, not elsewhere classified (R26.2);Pain Pain - Right/Left:  (Both) Pain - part of body: Shoulder;Knee;Leg;Arm    Time: 1610-9604 PT Time Calculation (min) (ACUTE ONLY): 24 min   Charges:   PT Evaluation $PT Eval Moderate Complexity: 1 Mod PT Treatments $Therapeutic Activity: 8-22 mins PT General Charges $$ ACUTE PT VISIT: 1 Visit         Johnny Bridge, PT Acute Rehab   Jacqualyn Posey 11/02/2022, 11:21 AM
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
## 2022-11-02 NOTE — Progress Notes (Signed)
PROGRESS NOTE    [place]  OZH:[id number] DOB: 07/22/1940 DOA: 10/28/2022 PCP: System, Provider Not In   Brief Narrative: [name] is a 82 y.o. female with medical history significant of allergic rhinitis, osteoarthritis, history of CVA with residual right-sided hemiparesis, chronic diastolic heart failure, cholelithiasis, chronic back pain, liver cirrhosis, colon polyps, COPD, depression, type 2 diabetes, diverticulosis, fatty liver, history of liver cirrhosis, gastritis, gastroparesis, GERD, history of headaches, hyperlipidemia, hypertension, CAD, history of MI, diabetic neuropathy, class II obesity, OSA not on CPAP, osteoporosis, seizure disorder who was brought to the emergency department due to generalized pain and altered mental status with hallucinations for the past 4 days, but they seem to be significantly have been getting worse.  She has been seeing things at home that was not there. She also has been following with Dr. 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  She was moved to stepdown unit placed on BiPAP ABG consistent with hypercapnia.   Lab work: Urinalysis was hazy with ketones of 5 and protein of 100 mg/dL.  Negative nitrites and leukocyte esterase.  There were many bacteria.  The rest of the urine analysis was normal.  UDS was negative.  Her CBC showed a white count of 7.0, hemoglobin 11.2 g/dL platelets 629.   Negative coronavirus PCR.  Lactic acid, ammonia, troponin, total CK, alcohol level and BNP were normal.  CMP with normal electrolytes after calcium correction.  AST 12 units/L, albumin 3.2 g/dL and glucose 528 mg/dL.  The rest of the LFTs and renal function were normal.   Imaging: Two-view portable chest radiograph with suspicious right upper lobe lung nodule as demonstrated on cervical CT on 09/12/2022.  Left femur x-ray with no acute fracture or dislocation.  CT head without contrast with no acute  intracranial abnormality.   Assessment & Plan:   Principal Problem:   Acute metabolic encephalopathy Active Problems:   COPD   GERD   Hepatic cirrhosis (HCC)   Gastroparesis   Chronic diastolic CHF (congestive heart failure) (HCC)   Vomiting   Essential hypertension   HLD (hyperlipidemia)   Sleep disorder   Type 2 diabetes mellitus (HCC)   Epilepsy (HCC)   CAD (coronary artery disease)   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)  #1 Acute metabolic encephalopathy- likely due to to multiple causes including hypercapnia ?  Seizures ?  UTI,?  Acute urinary retention Seizures were ruled out with EEG.  Patient has history of seizure disorder and is on Keppra and gabapentin. Urine culture with Citrobacter UTI on Rocephin She was hypercapnic and was placed on BiPAP with improvement in her gas and mental status. Patient had Foley catheter placed for acute urinary retention. With all the above treatment her mental status improved and was back to baseline per family.- patient was admitted with complaints of hallucination and confusion.  At baseline she lives at home does not walk much her daughter lives next door and takes care of the mother. patient has history of obstructive sleep apnea but does not use his CPAP at home Physical therapy evaluation is still pending.   #2 acute hypercapnic respiratory failure in the setting of COPD obstructive sleep apnea-she has been on and off BiPAP.  TOC consulted for [facility] on discharge.  Her daughter tells me she had a CPAP machine at home but she has not used it for over 10 years. patient also continues to smoke  currently and has been smoking for over 70 years. BiPAP nightly and as needed  #3 history of epilepsy on Keppra and gabapentin  #4 acute urinary retention with Citrobacter UTI -on Rocephin  Foley placed in the ER Will attempt to DC Foley prior to discharge.  #5 history of  Cirrhosis/gastroparesis-  followed by  [facility] as [facility].  #6 chronic diastolic heart failure stable follow-up echo ejection fraction 55 to 60%.  Continue Lasix.  #7 essential hypertension-her blood pressure remains elevated on Norvasc 5 mg daily, Lasix 20 mg daily, hydralazine 25 mg every 8 hours. Hydralazine was added during the hospital stay. Lopressor 12.5 mg bid 7/15 Monitor closely and adjust the doses as needed.  #8 hyperlipidemia on atorvastatin  #9 type 2 diabetes continue SSI A1c 6.7 on 7/12 CBG (last 3)  Recent Labs    11/01/22 2340 11/02/22 0413 11/02/22 0712  GLUCAP 167* 111* 107*     #10 morbid obesity complicates overall prognosis and management  #11 history of stroke with hemiparesis continue statin and aspirin and plavix  #12 right upper lobe lung nodule saw Dr. Arbutus Ped on 10/14/2022 who recommended PET scan and follow-up with him in 3 weeks.  #13 tobacco abuse-advised cessation Continue nicotine patch  Estimated body mass index is 38.79 kg/m as calculated from the following:   Height as of this encounter: 5\' 6"  (1.676 m).   Weight as of this encounter: 109 kg.  DVT prophylaxis: Lovenox  code Status: DNR family Communication: Daughter at the bed side  disposition Plan:  Status is: Inpatient Consultants:  None  Procedures: None Antimicrobials: None  Subjective: Patient was awake today on nasal cannula oxygen daughter by the bedside Anxious to go home used BiPAP last night  objective: Vitals:   11/02/22 0400 11/02/22 0558 11/02/22 0600 11/02/22 0800  BP: (!) 140/51  (!) 141/72 (!) 158/67  Pulse:      Resp: 16  16 (!) 23  Temp: 99.2 F (37.3 C)   98.4 F (36.9 C)  TempSrc: Axillary   Oral  SpO2:  96%  95%  Weight:      Height:        Intake/Output Summary (Last 24 hours) at 11/02/2022 1113 Last data filed at 11/02/2022 0800 Gross per 24 hour  Intake 338.62 ml  Output 1225 ml  Net -886.38 ml   Filed Weights   10/28/22 0926  Weight: 109 kg     Examination:  General exam: Appears chronically ill-appearing  respiratory system: Diminished to auscultation. Respiratory effort normal. Cardiovascular system: S1 & S2 heard, RRR. No JVD, murmurs, rubs, gallops or clicks. No pedal edema. Gastrointestinal system: Abdomen is distended, soft and nontender. No organomegaly or masses felt. Normal bowel sounds heard. Central nervous system: On bipap Extremities: Edema 1+  Data Reviewed: I have personally reviewed following labs and imaging studies  CBC: Recent Labs  Lab 10/28/22 0957 10/29/22 0714 10/30/22 0258 11/01/22 0316  WBC 7.0 5.5 4.9 7.2  NEUTROABS 4.4  --  2.6  --   HGB 11.2* 10.5* 9.8* 10.7*  HCT 36.0 33.9* 32.3* 34.4*  MCV 96.5 98.0 100.0 95.8  PLT 250 224 193 228   Basic Metabolic Panel: Recent Labs  Lab 10/28/22 0957 10/29/22 0714 10/30/22 0258 11/01/22 0316  NA 137 137 138 137  K 3.8 3.7 3.7 4.3  CL 102 102 104 101  CO2 28 29 28 28   GLUCOSE 155* 122* 80 122*  BUN 23 17 16 13   CREATININE 0.94 0.79 0.80  0.73  CALCIUM 8.7* 8.0* 8.1* 8.7*   GFR: Estimated Creatinine Clearance: 70.1 mL/min (by C-G formula based on SCr of 0.73 mg/dL). Liver Function Tests: Recent Labs  Lab 10/28/22 0957 10/29/22 0714 11/01/22 0316  AST 12* 11* 13*  ALT 9 7 11   ALKPHOS 105 88 96  BILITOT 0.5 0.5 0.5  PROT 7.1 5.6* 6.2*  ALBUMIN 3.2* 2.5* 2.7*   No results for input(s): "LIPASE", "AMYLASE" in the last 168 hours. Recent Labs  Lab 10/28/22 0957  AMMONIA 20   Coagulation Profile: Recent Labs  Lab 10/28/22 0957  INR 1.0   Cardiac Enzymes: Recent Labs  Lab 10/28/22 0957  CKTOTAL 161   BNP (last 3 results) No results for input(s): "PROBNP" in the last 8760 hours. HbA1C: No results for input(s): "HGBA1C" in the last 72 hours.  CBG: Recent Labs  Lab 11/01/22 1601 11/01/22 1926 11/01/22 2340 11/02/22 0413 11/02/22 0712  GLUCAP 156* 113* 167* 111* 107*   Lipid Profile: No results for input(s):  "CHOL", "HDL", "LDLCALC", "TRIG", "CHOLHDL", "LDLDIRECT" in the last 72 hours. Thyroid Function Tests: No results for input(s): "TSH", "T4TOTAL", "FREET4", "T3FREE", "THYROIDAB" in the last 72 hours. Anemia Panel: No results for input(s): "VITAMINB12", "FOLATE", "FERRITIN", "TIBC", "IRON", "RETICCTPCT" in the last 72 hours. Sepsis Labs: Recent Labs  Lab 10/28/22 0957  LATICACIDVEN 0.8    Recent Results (from the past 240 hour(s))  SARS Coronavirus 2 by RT PCR (hospital order, performed in [facility] hospital lab) *cepheid single result test* Anterior Nasal Swab     Status: None   Collection Time: 10/28/22 10:37 AM   Specimen: Anterior Nasal Swab  Result Value Ref Range Status   SARS Coronavirus 2 by RT PCR NEGATIVE NEGATIVE Final    Comment: (NOTE) SARS-CoV-2 target nucleic acids are NOT DETECTED.  The SARS-CoV-2 RNA is generally detectable in upper and lower respiratory specimens during the acute phase of infection. The lowest concentration of SARS-CoV-2 viral copies this assay can detect is 250 copies / mL. A negative result does not preclude SARS-CoV-2 infection and should not be used as the sole basis for treatment or other patient management decisions.  A negative result may occur with improper specimen collection / handling, submission of specimen other than nasopharyngeal swab, presence of viral mutation(s) within the areas targeted by this assay, and inadequate number of viral copies (<250 copies / mL). A negative result must be combined with clinical observations, patient history, and epidemiological information.  Fact Sheet for Patients:   RoadLapTop.co.za  Fact Sheet for Healthcare Providers: http://kim-miller.com/  This test is not yet approved or  cleared by the [place] FDA and has been authorized for detection and/or diagnosis of SARS-CoV-2 by FDA under an Emergency Use Authorization (EUA).  This EUA will  remain in effect (meaning this test can be used) for the duration of the COVID-19 declaration under Section 564(b)(1) of the Act, 21 U.S.C. section 360bbb-3(b)(1), unless the authorization is terminated or revoked sooner.  Performed at [facility], [address], [place], [place] [address]   Urine Culture     Status: Abnormal   Collection Time: 10/28/22  1:40 PM   Specimen: Urine, Catheterized  Result Value Ref Range Status   Specimen Description   Final    URINE, CATHETERIZED Performed at [facility], [address], [place], [place] [address]    Special Requests   Final    NONE Performed at [facility], [address]  [place], [place] [address]    Culture >=100,000 COLONIES/mL CITROBACTER AMALONATICUS (A)  Final   Report Status 10/30/2022 FINAL  Final   Organism ID, Bacteria CITROBACTER AMALONATICUS (A)  Final      Susceptibility   Citrobacter amalonaticus - MIC*    CEFEPIME <=0.12 SENSITIVE Sensitive     CEFTRIAXONE <=0.25 SENSITIVE Sensitive     CIPROFLOXACIN <=0.25 SENSITIVE Sensitive     GENTAMICIN <=1 SENSITIVE Sensitive     IMIPENEM <=0.25 SENSITIVE Sensitive     NITROFURANTOIN 64 INTERMEDIATE Intermediate     TRIMETH/SULFA <=20 SENSITIVE Sensitive     PIP/TAZO <=4 SENSITIVE Sensitive     * >=100,000 COLONIES/mL CITROBACTER AMALONATICUS  MRSA Next Gen by PCR, Nasal     Status: None   Collection Time: 10/29/22  6:55 PM   Specimen: Nasal Mucosa; Nasal Swab  Result Value Ref Range Status   MRSA by PCR Next Gen NOT DETECTED NOT DETECTED Final    Comment: (NOTE) The GeneXpert MRSA Assay (FDA approved for NASAL specimens only), is one component of a comprehensive MRSA colonization surveillance program. It is not intended to diagnose MRSA infection nor to guide or monitor treatment for MRSA infections. Test performance is not FDA approved in patients less than 36 years old. Performed at [facility], [place], [address] [place], [place] [address]          Radiology Studies: No results found.      Scheduled Meds:  amLODipine  5 mg Oral Daily   aspirin EC  81 mg Oral Daily   Chlorhexidine Gluconate Cloth  6 each Topical Daily   clopidogrel  75 mg Oral Daily   enoxaparin (LOVENOX) injection  40 mg Subcutaneous Q24H   feeding supplement  237 mL Oral BID BM   furosemide  20 mg Intravenous Daily   gabapentin  300 mg Oral BID   hydrALAZINE  25 mg Oral Q8H   insulin aspart  0-15 Units Subcutaneous Q4H   levETIRAcetam  500 mg Oral BID   metoprolol tartrate  12.5 mg Oral BID   multivitamin with minerals  1 tablet Oral Daily   potassium chloride  20 mEq Oral Daily   Continuous Infusions:  sodium chloride Stopped (11/01/22 1429)   cefTRIAXone (ROCEPHIN)  IV Stopped (11/01/22 1420)     LOS: 4 days    Time spent: 39 min Alwyn Ren, MD 11/02/2022, 11:13 AM

## 2022-11-03 ENCOUNTER — Ambulatory Visit: Payer: Medicare PPO | Admitting: Physician Assistant

## 2022-11-03 DIAGNOSIS — R338 Other retention of urine: Secondary | ICD-10-CM

## 2022-11-03 DIAGNOSIS — G4733 Obstructive sleep apnea (adult) (pediatric): Secondary | ICD-10-CM

## 2022-11-03 DIAGNOSIS — N3000 Acute cystitis without hematuria: Secondary | ICD-10-CM

## 2022-11-03 DIAGNOSIS — I2510 Atherosclerotic heart disease of native coronary artery without angina pectoris: Secondary | ICD-10-CM

## 2022-11-03 DIAGNOSIS — J9601 Acute respiratory failure with hypoxia: Secondary | ICD-10-CM

## 2022-11-03 DIAGNOSIS — Z8669 Personal history of other diseases of the nervous system and sense organs: Secondary | ICD-10-CM

## 2022-11-03 DIAGNOSIS — G9341 Metabolic encephalopathy: Secondary | ICD-10-CM | POA: Diagnosis not present

## 2022-11-03 DIAGNOSIS — J9622 Acute and chronic respiratory failure with hypercapnia: Secondary | ICD-10-CM | POA: Diagnosis not present

## 2022-11-03 DIAGNOSIS — G4730 Sleep apnea, unspecified: Secondary | ICD-10-CM | POA: Diagnosis not present

## 2022-11-03 DIAGNOSIS — J4489 Other specified chronic obstructive pulmonary disease: Secondary | ICD-10-CM | POA: Diagnosis not present

## 2022-11-03 DIAGNOSIS — J9621 Acute and chronic respiratory failure with hypoxia: Secondary | ICD-10-CM | POA: Diagnosis not present

## 2022-11-03 LAB — COMPREHENSIVE METABOLIC PANEL
ALT: 14 U/L (ref 0–44)
AST: 16 U/L (ref 15–41)
Albumin: 2.3 g/dL — ABNORMAL LOW (ref 3.5–5.0)
Alkaline Phosphatase: 75 U/L (ref 38–126)
Anion gap: 5 (ref 5–15)
BUN: 17 mg/dL (ref 8–23)
CO2: 30 mmol/L (ref 22–32)
Calcium: 8.5 mg/dL — ABNORMAL LOW (ref 8.9–10.3)
Chloride: 99 mmol/L (ref 98–111)
Creatinine, Ser: 0.85 mg/dL (ref 0.44–1.00)
GFR, Estimated: >60 mL/min (ref >60)
Glucose, Bld: 128 mg/dL — ABNORMAL HIGH (ref 70–99)
Potassium: 4.9 mmol/L (ref 3.5–5.1)
Sodium: 134 mmol/L — ABNORMAL LOW (ref 135–145)
Total Bilirubin: 0.5 mg/dL (ref 0.3–1.2)
Total Protein: 5.6 g/dL — ABNORMAL LOW (ref 6.5–8.1)

## 2022-11-03 LAB — CBC
HCT: 28.9 % — ABNORMAL LOW (ref 36.0–46.0)
Hemoglobin: 9.3 g/dL — ABNORMAL LOW (ref 12.0–15.0)
MCH: 29.8 pg (ref 26.0–34.0)
MCHC: 32.2 g/dL (ref 30.0–36.0)
MCV: 92.6 fL (ref 80.0–100.0)
Platelets: 196 10*3/uL (ref 150–400)
RBC: 3.12 MIL/uL — ABNORMAL LOW (ref 3.87–5.11)
RDW: 13.5 % (ref 11.5–15.5)
WBC: 6.2 10*3/uL (ref 4.0–10.5)
nRBC: 0 % (ref 0.0–0.2)

## 2022-11-03 LAB — GLUCOSE, CAPILLARY
Glucose-Capillary: 112 mg/dL — ABNORMAL HIGH (ref 70–99)
Glucose-Capillary: 124 mg/dL — ABNORMAL HIGH (ref 70–99)
Glucose-Capillary: 155 mg/dL — ABNORMAL HIGH (ref 70–99)
Glucose-Capillary: 221 mg/dL — ABNORMAL HIGH (ref 70–99)
Glucose-Capillary: 217 mg/dL — ABNORMAL HIGH (ref 70–99)
Glucose-Capillary: 172 mg/dL — ABNORMAL HIGH (ref 70–99)

## 2022-11-03 MED ORDER — INSULIN ASPART 100 UNIT/ML IJ SOLN
0.0000 [IU] | Freq: Three times a day (TID) | INTRAMUSCULAR | Status: DC
  Administered 2022-11-04: 8 [IU] via SUBCUTANEOUS
  Administered 2022-11-04: 3 [IU] via SUBCUTANEOUS
  Administered 2022-11-04: 5 [IU] via SUBCUTANEOUS
  Administered 2022-11-05: 2 [IU] via SUBCUTANEOUS
  Administered 2022-11-05: 5 [IU] via SUBCUTANEOUS

## 2022-11-03 NOTE — Hospital Course (Signed)
Ms. Druck is an 82 yo female with PMH  allergic rhinitis, osteoarthritis, history of CVA with residual right-sided hemiparesis, chronic dCHF, cholelithiasis, chronic back pain, liver cirrhosis, colon polyps, COPD, depression, DMII, diverticulosis, gastritis, gastroparesis, GERD, history of headaches, HLD, HTN, hx MI, diabetic neuropathy, class II obesity, OSA not on CPAP, osteoporosis, seizure disorder. She was brought to the ER with generalized pain, AMS, hallucinations.  She also has been following with Dr. 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  She was moved to stepdown unit placed on BiPAP ABG consistent with hypercapnia.  She was admitted for acute metabolic encephalopathy with hypoxic hypercarbic respiratory failure. She endorsed smoking ~3 PPD on admission as well.  VBG was compatible with hypercarbia on admission.  She was placed on BiPAP and pulmonology was also consulted.

## 2022-11-03 NOTE — Progress Notes (Signed)
Progress Note    [place]   UEA:[id number]  DOB: [birth year]  DOA: 10/28/2022     5 PCP: System, Provider Not In  Initial CC: generalized pain, AMS  Hospital Course: [name] is an 82 yo female with PMH  allergic rhinitis, osteoarthritis, history of CVA with residual right-sided hemiparesis, chronic dCHF, cholelithiasis, chronic back pain, liver cirrhosis, colon polyps, COPD, depression, DMII, diverticulosis, gastritis, gastroparesis, GERD, history of headaches, HLD, HTN, hx MI, diabetic neuropathy, class II obesity, OSA not on CPAP, osteoporosis, seizure disorder. She was brought to the ER with generalized pain, AMS, hallucinations.  She also has been following with Dr. 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  She was moved to stepdown unit placed on BiPAP ABG consistent with hypercapnia.  She was admitted for acute metabolic encephalopathy with hypoxic hypercarbic respiratory failure. She endorsed smoking ~3 PPD on admission as well.  VBG was compatible with hypercarbia on admission.  She was placed on BiPAP and pulmonology was also consulted.  Interval History:  No events overnight.  Daughter present bedside this morning.  Patient feeling overall improved.  Amenable for Foley removal today and trial of void.  Assessment and Plan:  Acute metabolic encephalopathy -Suspected multifactorial from hypercarbia, UTI - negative EEG - Ucx noted with citrobacter; continue abx course to completion  - s/p BIPAP with improvement in mentation  - s/p foley for acute retention  Acute hypoxic hypercarbic respiratory failure COPD OSA - History of underlying COPD with OSA - Pulmonology following, appreciate assistance -Plan is for BiPAP at discharge - Also needs outpatient sleep study per pulmonology -Continue Anoro Ellipta   History of epilepsy - continue Keppra and gabapentin   Acute urinary retention UTI - UCx  with Citrobacter - continue rocephin  - s/p foley in ER - d/c foley on 7/17 and perform bladder scans   History of  Cirrhosis/gastroparesis -  followed by [facility] as outpatient.   Chronic dCHF - diastolic heart failure stable follow-up echo ejection fraction 55 to 60%.  Continue Lasix.   HTN - continue amlodipine, lopressor, lasix    HLD - on lipitor at home  DMII - continue SSI and CBG monitoring   Morbid obesity - complicates overall prognosis and management   Hx CVA - continue statin, aspirin, and plavix   RUL lung nodule - following with Dr. Arbutus Ped - has PET scheduled for 11/18/22  Tobacco dependence - continue nicotine patch; smoking approx 3 PPD on admission   Old records reviewed in assessment of this patient  Antimicrobials: Rocephin 7/11 >> current   DVT prophylaxis:  enoxaparin (LOVENOX) injection 40 mg Start: 10/28/22 2200   Code Status:   Code Status: DNR  Mobility Assessment (Last 72 Hours)     Mobility Assessment     Row Name 11/03/22 0811 11/02/22 2331 11/02/22 1352 11/02/22 1104 11/02/22 0800   Does patient have an order for bedrest or is patient medically unstable No - Continue assessment No - Continue assessment No - Continue assessment -- No - Continue assessment   What is the highest level of mobility based on the progressive mobility assessment? Level 1 (Bedfast) - Unable to balance while sitting on edge of bed Level 1 (Bedfast) - Unable to balance while sitting on edge of bed Level 1 (Bedfast) - Unable to balance while sitting on edge of bed Level 3 (Stands with assist) - Balance while standing  and cannot march  in place Level 2 (Chairfast) - Balance while sitting on edge of bed and cannot stand   Is the above level different from baseline mobility prior to current illness? Yes - Recommend PT order Yes - Recommend PT order Yes - Recommend PT order -- Yes - Recommend PT order    Row Name 11/01/22 0836           Does patient have an order  for bedrest or is patient medically unstable No - Continue assessment       What is the highest level of mobility based on the progressive mobility assessment? Level 2 (Chairfast) - Balance while sitting on edge of bed and cannot stand       Is the above level different from baseline mobility prior to current illness? Yes - Recommend PT order                Barriers to discharge: none Disposition Plan:  SNF Status is: Inpt  Objective: Blood pressure (!) 150/58, pulse 71, temperature 98.2 F (36.8 C), temperature source Oral, resp. rate (!) 24, height 5\' 6"  (1.676 m), weight 109 kg, SpO2 100%.  Examination:  Physical Exam Constitutional:      Appearance: Normal appearance.  HENT:     Head: Normocephalic and atraumatic.     Mouth/Throat:     Mouth: Mucous membranes are moist.  Eyes:     Extraocular Movements: Extraocular movements intact.  Cardiovascular:     Rate and Rhythm: Normal rate and regular rhythm.  Pulmonary:     Effort: Pulmonary effort is normal.     Breath sounds: Normal breath sounds.  Abdominal:     General: Bowel sounds are normal. There is no distension.     Palpations: Abdomen is soft.     Tenderness: There is no abdominal tenderness.  Musculoskeletal:        General: Normal range of motion.     Cervical back: Normal range of motion and neck supple.  Skin:    General: Skin is warm and dry.  Neurological:     General: No focal deficit present.     Mental Status: She is alert.  Psychiatric:        Mood and Affect: Mood normal.      Consultants:  Pulmonology   Procedures:    Data Reviewed: Results for orders placed or performed during the hospital encounter of 10/28/22 (from the past 24 hour(s))  Glucose, capillary     Status: Abnormal   Collection Time: 11/02/22  5:01 PM  Result Value Ref Range   Glucose-Capillary 171 (H) 70 - 99 mg/dL  Glucose, capillary     Status: Abnormal   Collection Time: 11/02/22  9:32 PM  Result Value Ref Range    Glucose-Capillary 173 (H) 70 - 99 mg/dL  Glucose, capillary     Status: Abnormal   Collection Time: 11/02/22 11:36 PM  Result Value Ref Range   Glucose-Capillary 152 (H) 70 - 99 mg/dL  CBC     Status: Abnormal   Collection Time: 11/03/22  4:07 AM  Result Value Ref Range   WBC 6.2 4.0 - 10.5 K/uL   RBC 3.12 (L) 3.87 - 5.11 MIL/uL   Hemoglobin 9.3 (L) 12.0 - 15.0 g/dL   HCT 75.6 (L) 43.3 - 29.5 %   MCV 92.6 80.0 - 100.0 fL   MCH 29.8 26.0 - 34.0 pg   MCHC 32.2 30.0 - 36.0 g/dL   RDW 18.8 41.6 - 60.6 %  Platelets 196 150 - 400 K/uL   nRBC 0.0 0.0 - 0.2 %  Comprehensive metabolic panel     Status: Abnormal   Collection Time: 11/03/22  4:07 AM  Result Value Ref Range   Sodium 134 (L) 135 - 145 mmol/L   Potassium 4.9 3.5 - 5.1 mmol/L   Chloride 99 98 - 111 mmol/L   CO2 30 22 - 32 mmol/L   Glucose, Bld 128 (H) 70 - 99 mg/dL   BUN 17 8 - 23 mg/dL   Creatinine, Ser 1.61 0.44 - 1.00 mg/dL   Calcium 8.5 (L) 8.9 - 10.3 mg/dL   Total Protein 5.6 (L) 6.5 - 8.1 g/dL   Albumin 2.3 (L) 3.5 - 5.0 g/dL   AST 16 15 - 41 U/L   ALT 14 0 - 44 U/L   Alkaline Phosphatase 75 38 - 126 U/L   Total Bilirubin 0.5 0.3 - 1.2 mg/dL   GFR, Estimated >09 >60 mL/min   Anion gap 5 5 - 15  Glucose, capillary     Status: Abnormal   Collection Time: 11/03/22  6:11 AM  Result Value Ref Range   Glucose-Capillary 112 (H) 70 - 99 mg/dL  Glucose, capillary     Status: Abnormal   Collection Time: 11/03/22  8:00 AM  Result Value Ref Range   Glucose-Capillary 124 (H) 70 - 99 mg/dL  Glucose, capillary     Status: Abnormal   Collection Time: 11/03/22 11:34 AM  Result Value Ref Range   Glucose-Capillary 155 (H) 70 - 99 mg/dL    I have reviewed pertinent nursing notes, vitals, labs, and images as necessary. I have ordered labwork to follow up on as indicated.  I have reviewed the last notes from staff over past 24 hours. I have discussed patient's care plan and test results with nursing staff, CM/SW, and other  staff as appropriate.  Time spent: Greater than 50% of the 55 minute visit was spent in counseling/coordination of care for the patient as laid out in the A&P.   LOS: 5 days   Lewie Chamber, MD [facility] 11/03/2022, 2:08 PM

## 2022-11-03 NOTE — Care Management Important Message (Signed)
Important Message  Patient Details  Name: [name] MRN: [id number] Date of Birth: 25-May-1940   Medicare Important Message Given:  Yes     Mardene Sayer 11/03/2022, 2:06 PM

## 2022-11-03 NOTE — Progress Notes (Signed)
[name] has Acute on Chronic hypoxic/hypercapnic respiratory failure secondary to COPD. Pt requires frequent durations of respiratory support and deteriorates quickly in the absence of non-invasive mechanical ventilator. BIPAP,BIPAP ST, AVAPS has been considered but has been ruled-out and insufficient. NIV therapy is needed Pt's PC02 was >55 during this hospital stay on 10/30/2022. Interruption or failure to provide NIV would quickly lead to exacerbation of the patient's condition, lead to hospitalization and likely harm the patient or possibly death. Continued use of the NIV is preferred. Patient is able to maintain airway and clear secretions.

## 2022-11-03 NOTE — Progress Notes (Signed)
NAME:  [name], MRN:  [id number], DOB:  28-Aug-1940, LOS: 5 ADMISSION DATE:  10/28/2022, CONSULTATION DATE:  11/02/2022 REFERRING MD:  Dr. Jerolyn Center, [facility], CHIEF COMPLAINT:  Altered mental status   History of Present Illness:  82 yr old female smoker brought to [facility] ER with hallucinations and weakness.  She was started on therapy for a UTI and urine retention.  She was found to have hypercapnia.  She was started on Bipap.  She had improvement in mental status.  PCCM consulted to assist with management of respiratory failure.  Pertinent  Medical History  Allergies, Arthritis, CVA, HFpEF, Fatty liver, Colon polyps, DJD, Depression, DM type 2, Diverticulosis, Gastroparesis, GERD, HLD, HTN, Migraine headache, Neuropathy, Osteoporosis, Seizures, ACE inhibitor cough  Studies:  PFT 03/08/08 >> FEV1 1.38 (56%), FEV1% 67, TLC 3.74 (66%), DLCO 57% PSG 05/18/09 >> AHI 106.2, SpO2 low 78% EEG 10/29/22 >> normal MRI brain 10/29/22 >> no acute process Echo 10/29/22 >> EF 55 to 60%, mild LVH ABG with Bipap and 30% FiO2 10/30/22 >> pH 7.36, PCO2 55, PO2 112  Interim History / Subjective:  Had trouble tolerating NIV overnight.  Has a dry mouth.  Cough better.  Objective   BP (!) 149/64   Pulse 64   Temp 99.3 F (37.4 C) (Oral)   Resp (!) 21   Ht 5\' 6"  (1.676 m)   Wt 109 kg   SpO2 98%   BMI 38.79 kg/m    Intake/Output Summary (Last 24 hours) at 11/03/2022 1255 Last data filed at 11/03/2022 0300 Gross per 24 hour  Intake 530 ml  Output 350 ml  Net 180 ml    Examination:  General - alert Eyes - pupils reactive ENT - no sinus tenderness, no stridor Cardiac - regular rate/rhythm, no murmur Chest - equal breath sounds b/l, no wheezing or rales Abdomen - soft, non tender, + bowel sounds Extremities - no cyanosis, clubbing, or edema Skin - no rashes Neuro - normal strength, moves extremities, follows commands Psych - normal mood and behavior   Assessment & Plan:   Acute on  chronic hypoxic/hypercapnic respiratory failure. - from COPD, sleep disordered breathing, and hypoventilation with morbid obesity - goal SpO2 90 to 95% - arranging for NIV >> will be going to SNF before d/c home - likely will need repeat sleep testing as an outpt  COPD with chronic bronchitis. - continue anoro - prn albuterol - will need repeat PFT as an outpt  Tobacco abuse. - nicotine patch  17 mm right upper lobe lung nodule. - seen on neck CT from 09/12/22 - followed by Dr. Arbutus Ped with oncology as an outpatient - PET scan scheduled for 11/18/22  Goals of care. - DNR/DNI  Acute metabolic encephalopathy. UTI with Citrobacter. Acute urine retention. Seizure disorder. Fatty liver. Gastroparesis. Chronic HFpEF. HTN. DM type 2. - per primary team  Updated her daughter at bedside.  Labs       Latest Ref Rng & Units 11/03/2022    4:07 AM 11/01/2022    3:16 AM 10/30/2022    2:58 AM  CMP  Glucose 70 - 99 mg/dL 409  811  80   BUN 8 - 23 mg/dL 17  13  16    Creatinine 0.44 - 1.00 mg/dL 9.14  7.82  9.56   Sodium 135 - 145 mmol/L 134  137  138   Potassium 3.5 - 5.1 mmol/L 4.9  4.3  3.7   Chloride 98 - 111 mmol/L 99  101  104   CO2 22 - 32 mmol/L 30  28  28    Calcium 8.9 - 10.3 mg/dL 8.5  8.7  8.1   Total Protein 6.5 - 8.1 g/dL 5.6  6.2    Total Bilirubin 0.3 - 1.2 mg/dL 0.5  0.5    Alkaline Phos 38 - 126 U/L 75  96    AST 15 - 41 U/L 16  13    ALT 0 - 44 U/L 14  11         Latest Ref Rng & Units 11/03/2022    4:07 AM 11/01/2022    3:16 AM 10/30/2022    2:58 AM  CBC  WBC 4.0 - 10.5 K/uL 6.2  7.2  4.9   Hemoglobin 12.0 - 15.0 g/dL 9.3  16.1  9.8   Hematocrit 36.0 - 46.0 % 28.9  34.4  32.3   Platelets 150 - 400 K/uL 196  228  193     ABG    Component Value Date/Time   PHART 7.36 10/30/2022 0807   PCO2ART 55 (H) 10/30/2022 0807   PO2ART 112 (H) 10/30/2022 0807   HCO3 31.1 (H) 10/30/2022 0807   TCO2 31 05/14/2022 0605   O2SAT 100 10/30/2022 0807   CBG (last 3)   Recent Labs    11/03/22 0611 11/03/22 0800 11/03/22 1134  GLUCAP 112* 124* 155*   Signature:  Coralyn Helling, MD [facility] Pager - [phone] or [phone] 11/03/2022, 12:55 PM     p

## 2022-11-03 NOTE — TOC Progression Note (Addendum)
Transition of Care [facility]) - Progression Note    Patient Details  Name: [name] MRN: [id number] Date of Birth: March 18, 1941  Transition of Care [facility]) CM/SW Contact  Howell Rucks, RN Phone Number: 11/03/2022, 12:14 PM  Clinical Narrative:   Met with pt's dtr [name]) at bedside to introduce role of TOC/NCM and review for dc planning- PT recommendation for short term rehab-SNF, dtr agreeable, prefers [facility], dtr agreeable to NCM sending out fax for multiple bed offers with [facility] as preferred, await bed offers. FL2 completed  -2:24pm TOC consulted for [facility], [employer]- rep Jerm[place], sent verbiage and forms required( NIV Prescription and Order form).  Verbiage entered in progress note for MD co signature, forms placed on pt's shadow chart for co signature by MD. Teams chat sent to attending to cosign verbiage progress note and complete forms.   -3:59pm [facility] order form and prescription  signed by attending, scanned and emailed to [place] [facility] [place]. Verbiage progress noted cosigned by MD.     Expected Discharge Plan: Home/Self Care Barriers to Discharge: Continued Medical Work up  Expected Discharge Plan and Services   Discharge Planning Services: CM Consult Post Acute Care Choice: Resumption of Svcs/PTA Provider ([name]) Living arrangements for the past 2 months: Apartment                                       Social Determinants of Health (SDOH) Interventions SDOH Screenings   Food Insecurity: No Food Insecurity (10/31/2022)  Housing: Low Risk  (10/31/2022)  Transportation Needs: No Transportation Needs (10/31/2022)  Utilities: Not At Risk (10/31/2022)  Alcohol Screen: Low Risk  (06/07/2022)  Physical Activity: Inactive (06/07/2022)  Tobacco Use: High Risk (10/28/2022)    Readmission Risk Interventions    11/02/2022    2:16 PM  Readmission Risk Prevention Plan  Transportation Screening Complete  PCP or Specialist Appt  within 5-7 Days Complete  Home Care Screening Complete  Medication Review (RN CM) Complete

## 2022-11-03 NOTE — NC FL2 (Signed)
[place] MEDICAID FL2 LEVEL OF CARE FORM     IDENTIFICATION  Patient Name: [name] Birthdate: [birth year] Sex: female Admission Date (Current Location): 10/28/2022  [facility] and [place] Number:  Producer, television/film/video and Address:  [facility],  [address] [place], [name] [address]      Provider Number: [id number]  Attending Physician Name and Address:  Lewie Chamber, MD  Relative Name and Phone Number:  [name] (Daughter)  [phone]    Current Level of Care: Hospital Recommended Level of Care: Skilled Nursing Facility Prior Approval Number:    Date Approved/Denied:   PASRR Number: [id number]  Discharge Plan: SNF    Current Diagnoses: Patient Active Problem List   Diagnosis Date Noted   Acute metabolic encephalopathy 10/28/2022   Type 2 diabetes mellitus (HCC) 10/28/2022   Epilepsy (HCC) 10/28/2022   CAD (coronary artery disease) 10/28/2022   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) 10/28/2022   Pain due to onychomycosis of toenails of both feet 04/20/2022   Generalized weakness    Seizure (HCC)    AMS (altered mental status) 04/20/2019   Hypoglycemia    Critical lower limb ischemia (HCC) 10/28/2017   PNA (pneumonia) 06/08/2017   Epigastric pain 05/27/2017   Vitamin D deficiency 02/15/2017   Cough 05/26/2016   Abnormal chest xray 03/08/2016   Contusion of left chest wall 12/29/2015   Diabetes (HCC) 06/20/2015   Sleep disorder 06/20/2015   Screening for cancer 06/20/2015   Anemia 05/22/2015   CAP (community acquired pneumonia) 05/06/2015   AKI (acute kidney injury) (HCC) 05/06/2015   Nausea vomiting and diarrhea 05/06/2015   Essential hypertension 05/06/2015   Diabetes mellitus with complication (HCC) 05/06/2015   Dependent edema 05/06/2015   HLD (hyperlipidemia) 05/06/2015   Wellness examination 07/05/2014   Chronic left hip pain 11/27/2013   Chronic pain syndrome 06/27/2013   Renal  insufficiency 03/19/2013   Routine general medical examination at a health care facility 03/19/2013   Encounter for long-term (current) use of other medications 03/09/2013   Vomiting 11/22/2012   Chronic diastolic CHF (congestive heart failure) (HCC) 08/10/2012   Foot pain 02/22/2012   Personal history of colonic polyps 08/17/2011   Edema 06/28/2011   Gastroparesis 03/15/2011   Low back pain 01/12/2011   Cramp of limb 10/13/2010   INGROWN TOENAIL 06/23/2010   KNEE PAIN, RIGHT 02/03/2010   Convulsions (HCC) 01/06/2010   HYPERSOMNIA, ASSOCIATED WITH SLEEP APNEA 05/07/2009   OTHER DYSPNEA AND RESPIRATORY ABNORMALITIES 04/23/2009   COPD 04/22/2009   HYPERCHOLESTEROLEMIA 02/25/2009   HYPERTENSION 12/20/2008   Hemiplegia, late effect of cerebrovascular disease (HCC) 12/20/2008   SMOKER 02/22/2008   DEPRESSION 02/22/2008   DYSPNEA 02/22/2008   Chest wall pain 02/22/2008   ASYMPTOMATIC POSTMENOPAUSAL STATUS 02/22/2008   HYPOKALEMIA 01/15/2008   COLONIC POLYPS 09/13/2007   DEGENERATIVE JOINT DISEASE 06/15/2007   ALLERGIC RHINITIS 10/29/2006   GERD 10/29/2006   Hepatic cirrhosis (HCC) 10/29/2006   Osteoporosis 10/29/2006    Orientation RESPIRATION BLADDER Height & Weight     Self, Time, Situation  O2 Incontinent, External catheter Weight: 109 kg Height:  5\' 6"  (167.6 cm)  BEHAVIORAL SYMPTOMS/MOOD NEUROLOGICAL BOWEL NUTRITION STATUS    Convulsions/Seizures (Hx Seizure Disorder) Incontinent Diet (Dysphagia Diet)  AMBULATORY STATUS COMMUNICATION OF NEEDS Skin   Extensive Assist Verbally Other (Comment) (erythema to buttocks bilaterally, erythema to abdomen)  Personal Care Assistance Level of Assistance  Bathing, Feeding, Dressing Bathing Assistance: Maximum assistance Feeding assistance: Limited assistance Dressing Assistance: Maximum assistance     Functional Limitations Info  Sight, Hearing, Speech Sight Info: Impaired (eyeglasses) Hearing Info:  Adequate Speech Info: Adequate    SPECIAL CARE FACTORS FREQUENCY  PT (By licensed PT), OT (By licensed OT)     PT Frequency: 5x/wk OT Frequency: 5x/wk            Contractures Contractures Info: Not present    Additional Factors Info  Code Status, Allergies, Psychotropic Code Status Info: DNR Allergies Info: Hydrocodone, Lisinopril, Pioglitazone, Varenicline Tartrate Psychotropic Info: N/A         Current Medications (11/03/2022):  This is the current hospital active medication list Current Facility-Administered Medications  Medication Dose Route Frequency Provider Last Rate Last Admin   0.9 %  sodium chloride infusion   Intravenous PRN Alwyn Ren, MD   Stopped at 11/01/22 1429   acetaminophen (TYLENOL) tablet 650 mg  650 mg Oral Q6H PRN Bobette Mo, MD   650 mg at 10/30/22 1859   Or   acetaminophen (TYLENOL) suppository 650 mg  650 mg Rectal Q6H PRN Bobette Mo, MD       albuterol (PROVENTIL) (2.5 MG/3ML) 0.083% nebulizer solution 2.5 mg  2.5 mg Inhalation Q4H PRN Bobette Mo, MD       amLODipine (NORVASC) tablet 5 mg  5 mg Oral Daily Alwyn Ren, MD   5 mg at 11/03/22 9562   aspirin EC tablet 81 mg  81 mg Oral Daily Bobette Mo, MD   81 mg at 11/03/22 1037   cefTRIAXone (ROCEPHIN) 2 g in sodium chloride 0.9 % 100 mL IVPB  2 g Intravenous Q24H Alwyn Ren, MD 200 mL/hr at 11/02/22 1424 2 g at 11/02/22 1424   Chlorhexidine Gluconate Cloth 2 % PADS 6 each  6 each Topical Daily Bobette Mo, MD   6 each at 11/03/22 1038   clopidogrel (PLAVIX) tablet 75 mg  75 mg Oral Daily Bobette Mo, MD   75 mg at 11/03/22 0859   enoxaparin (LOVENOX) injection 40 mg  40 mg Subcutaneous Q24H Bobette Mo, MD   40 mg at 11/02/22 2134   feeding supplement (ENSURE ENLIVE / ENSURE PLUS) liquid 237 mL  237 mL Oral BID BM Bobette Mo, MD   237 mL at 11/03/22 1037   furosemide (LASIX) injection 20 mg  20 mg  Intravenous Daily Alwyn Ren, MD   20 mg at 11/03/22 0859   gabapentin (NEURONTIN) capsule 300 mg  300 mg Oral BID Bobette Mo, MD   300 mg at 11/03/22 0859   hydrALAZINE (APRESOLINE) tablet 25 mg  25 mg Oral Q8H Alwyn Ren, MD   25 mg at 11/03/22 0615   insulin aspart (novoLOG) injection 0-15 Units  0-15 Units Subcutaneous Q4H Alwyn Ren, MD   2 Units at 11/03/22 1038   levETIRAcetam (KEPPRA) tablet 500 mg  500 mg Oral BID Alwyn Ren, MD   500 mg at 11/03/22 0859   metoprolol tartrate (LOPRESSOR) tablet 12.5 mg  12.5 mg Oral BID Alwyn Ren, MD   12.5 mg at 11/03/22 0859   multivitamin with minerals tablet 1 tablet  1 tablet Oral Daily Alwyn Ren, MD   1 tablet at 11/03/22 0858   nicotine (NICODERM CQ - dosed in mg/24 hours) patch 14 mg  14 mg  Transdermal Daily Alwyn Ren, MD   14 mg at 11/03/22 8119   nystatin (MYCOSTATIN) 100000 UNIT/ML suspension 500,000 Units  5 mL Oral QID Alwyn Ren, MD   500,000 Units at 11/03/22 0858   ondansetron [facility]) tablet 4 mg  4 mg Oral Q6H PRN Bobette Mo, MD       Or   ondansetron [facility]) injection 4 mg  4 mg Intravenous Q6H PRN Bobette Mo, MD       Oral care mouth rinse  15 mL Mouth Rinse PRN Bobette Mo, MD   15 mL at 11/01/22 0825   potassium chloride SA (KLOR-CON M) CR tablet 20 mEq  20 mEq Oral Daily Alwyn Ren, MD   20 mEq at 11/03/22 1040   umeclidinium-vilanterol (ANORO ELLIPTA) 62.5-25 MCG/ACT 1 puff  1 puff Inhalation Daily Coralyn Helling, MD   1 puff at 11/03/22 1478     Discharge Medications: Please see discharge summary for a list of discharge medications.  Relevant Imaging Results:  Relevant Lab Results:   Additional Information SSN: [id number]  Howell Rucks, RN

## 2022-11-03 NOTE — Progress Notes (Signed)
 Placed patient on bipap for the night

## 2022-11-04 ENCOUNTER — Telehealth: Payer: Self-pay | Admitting: Pulmonary Disease

## 2022-11-04 DIAGNOSIS — J440 Chronic obstructive pulmonary disease with acute lower respiratory infection: Secondary | ICD-10-CM

## 2022-11-04 DIAGNOSIS — J9601 Acute respiratory failure with hypoxia: Secondary | ICD-10-CM | POA: Diagnosis not present

## 2022-11-04 DIAGNOSIS — J9602 Acute respiratory failure with hypercapnia: Secondary | ICD-10-CM

## 2022-11-04 DIAGNOSIS — G9341 Metabolic encephalopathy: Secondary | ICD-10-CM | POA: Diagnosis not present

## 2022-11-04 DIAGNOSIS — R338 Other retention of urine: Secondary | ICD-10-CM | POA: Diagnosis not present

## 2022-11-04 LAB — GLUCOSE, CAPILLARY
Glucose-Capillary: 151 mg/dL — ABNORMAL HIGH (ref 70–99)
Glucose-Capillary: 188 mg/dL — ABNORMAL HIGH (ref 70–99)
Glucose-Capillary: 201 mg/dL — ABNORMAL HIGH (ref 70–99)
Glucose-Capillary: 272 mg/dL — ABNORMAL HIGH (ref 70–99)
Glucose-Capillary: 208 mg/dL — ABNORMAL HIGH (ref 70–99)

## 2022-11-04 MED ORDER — ORAL CARE MOUTH RINSE
15.0000 mL | OROMUCOSAL | Status: DC
  Administered 2022-11-04 – 2022-11-05 (×5): 15 mL via OROMUCOSAL

## 2022-11-04 MED ORDER — ALUM & MAG HYDROXIDE-SIMETH 200-200-20 MG/5ML PO SUSP: 30.0000 mL | ORAL | Status: DC | PRN

## 2022-11-04 MED ORDER — CALCIUM CARBONATE ANTACID 500 MG PO CHEW
2.0000 | CHEWABLE_TABLET | Freq: Three times a day (TID) | ORAL | Status: DC | PRN
  Filled 2022-11-04: qty 2

## 2022-11-04 MED ORDER — ORAL CARE MOUTH RINSE: 15.0000 mL | OROMUCOSAL | Status: DC | PRN

## 2022-11-04 NOTE — Progress Notes (Signed)
Physical Therapy Treatment Patient Details Name: [name] MRN: [id number] DOB: 01-12-1941 Today's Date: 11/04/2022   History of Present Illness 82 yo female presents to therapy s/p hospital admission on 10/28/2022 due to generalized pain, AMS including hallucinations, progressive unintentional weight loss with episodes of vomiting and a nonproductive cough. Pt was found to have abn labs and dx of acute metabolic encephalopathy with suspect UTI. Imaging revealed R UL nodule, L LE no acute findings and CT of head with no acute abnormalities. 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 Pt has PMH including but not limited to: seizures, COPD, GERD, hepatic cirrhosis, gastroparesis, dCHF, HTN, HLD, DM II, CAD, OSA, and MI.    PT Comments  General Comments: AxO x 3 pleasant Lady who lives in an apartment with her botfiend and Son.  She uses a power chair for mobility but was able to transfer self in/OOB and power chair/toilet. Assisted OOB to recliner today was difficult.  General Comments: AxO x 3 pleasant Lady who lives in an apartment with her botfiend and Son.  She uses a power chair for mobility but was able to transfer self in/OOB and power chair/toilet.  General transfer comment: pull to stand from EOB with cues for encouragement and multimodal cues for power up and extension posture, once in standing pt required mod A to maintain at RW.  Difficulty shifting weight and stepping 1/4 turn to recliner before MAX c/o B LE weakness.  Required + 2 assist for safety.   Pt will need ST Rehab at SNF to address mobility and functional decline prior to safely returning home.     Assistance Recommended at Discharge Frequent or constant Supervision/Assistance  If plan is discharge home, recommend the following:  Can travel by private vehicle    Two people to help with walking and/or transfers;A lot of help with bathing/dressing/bathroom;Assistance with  cooking/housework;Direct supervision/assist for medications management;Direct supervision/assist for financial management;Assist for transportation;Help with stairs or ramp for entrance   No  Equipment Recommendations  None recommended by PT    Recommendations for Other Services       Precautions / Restrictions Precautions Precautions: Fall Precaution Comments: wean O2 Restrictions Weight Bearing Restrictions: No     Mobility  Bed Mobility Overal bed mobility: Needs Assistance Bed Mobility: Supine to Sit     Supine to sit: Max assist, HOB elevated     General Comments: AxO x 3 pleasant Lady who lives in an apartment with her botfiend and Son.  She uses a power chair for mobility but was able to transfer self in/OOB and power chair/toilet.    Transfers Overall transfer level: Needs assistance Equipment used: Rolling walker (2 wheels) Transfers: Sit to/from Stand Sit to Stand: Max assist, +2 physical assistance, +2 safety/equipment, From elevated surface           General transfer comment: pull to stand from EOB with cues for encouragement and multimodal cues for power up and extension posture, once in standing pt required mod A to maintain at [facility] for ~1:30    Ambulation/Gait               General Gait Details: transfer only due to poor transfer performance   Stairs             Wheelchair Mobility     Tilt Bed    Modified Rankin (Stroke Patients Only)       Balance  Cognition Arousal/Alertness: Awake/alert Behavior During Therapy: WFL for tasks assessed/performed Overall Cognitive Status: Within Functional Limits for tasks assessed                                 General Comments: AxO x 3 pleasant Lady who lives in an apartment with her botfiend and Son.  She uses a power chair for mobility but was able to transfer self in/OOB and power chair/toilet.         Exercises      General Comments        Pertinent Vitals/Pain Pain Assessment Pain Assessment: Faces Faces Pain Scale: Hurts a little bit Pain Location: B LE and B shoulders Pain Descriptors / Indicators: Aching, Discomfort, Grimacing Pain Intervention(s): Monitored during session    Home Living                          Prior Function            PT Goals (current goals can now be found in the care plan section) Progress towards PT goals: Progressing toward goals    Frequency    Min 1X/week      PT Plan Current plan remains appropriate    Co-evaluation              AM-PAC PT "6 Clicks" Mobility   Outcome Measure  Help needed turning from your back to your side while in a flat bed without using bedrails?: A Lot Help needed moving from lying on your back to sitting on the side of a flat bed without using bedrails?: A Lot Help needed moving to and from a bed to a chair (including a wheelchair)?: A Lot Help needed standing up from a chair using your arms (e.g., wheelchair or bedside chair)?: A Lot Help needed to walk in hospital room?: Total Help needed climbing 3-5 steps with a railing? : Total 6 Click Score: 10    End of Session Equipment Utilized During Treatment: Gait belt Activity Tolerance: Patient limited by fatigue;Patient limited by pain Patient left: with call bell/phone within reach;with family/visitor present;in chair Nurse Communication: Mobility status PT Visit Diagnosis: Unsteadiness on feet (R26.81);Repeated falls (R29.6);Muscle weakness (generalized) (M62.81);Difficulty in walking, not elsewhere classified (R26.2);Pain Pain - part of body: Shoulder;Knee;Leg;Arm     Time: 1012-1028 PT Time Calculation (min) (ACUTE ONLY): 16 min  Charges:    $Therapeutic Activity: 8-22 mins PT General Charges $$ ACUTE PT VISIT: 1 Visit                     Felecia Shelling  PTA Acute  Rehabilitation Services Office M-F           [phone]

## 2022-11-04 NOTE — Progress Notes (Signed)
Speech Language Pathology Treatment: Dysphagia  Patient Details Name: [name] MRN: [id number] DOB: 01-May-1940 Today's Date: 11/04/2022 Time: 4098-1191 SLP Time Calculation (min) (ACUTE ONLY): 20 min  Assessment / Plan / Recommendation Clinical Impression  Pt seen for dysphagia management given issue with "choking" on grits prior to evaluation. She is greeted with her daughter, Gershon Cull, with her. Pt sitting partially upright in bed. Denies repeated episodes of "choking" since her first event. Daughter states pt had retained in her mouth when MD saw her yesterday - SLP reiterated recommendation for pt to swish and expectorate or swallow with liquids when finished with her meal for solid clearance/airway protection. Dinner arrived and pt observed consuming spaghetti, single bite of brocolli, icecream and tea. No indication of airway compromise despite pt eating at rapid rate. She had difficulty masticating hard foods (nearly raw brocolli) and thus spat it out. Thus encouraged her to continue to order softer foods. Following meal with moderate verbal cues, pt rinsed and expectoration or swallow, pt cleared viscous secretions mixed with trace orange sherbert. Inquired if pt removing her dentures at night - which she advised she was not. Reiterated to her importance to remove her dentures and clean them as well as brush her tongue and oral cavity for pulmonary health. If she wants to sleep in them - just assure she cleans them. Daughter and pt agreeable to plan. No SLP follow up indicated as all education completed for dysphagia compensation. Thanks for this consult.     HPI HPI: 82 yo female presents to therapy s/p hospital admission on 10/28/2022 due to generalized pain, AMS including hallucinations, progressive unintentional weight loss with episodes of vomiting and a nonproductive cough. Pt was found to have abn labs and dx of acute metabolic encephalopathy with suspect UTI. Imaging  revealed R UL nodule, L LE no acute findings and CT of head with no acute abnormalities. 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 Pt has PMH including but not limited to: seizures, COPD, GERD, hepatic cirrhosis, gastroparesis, dCHF, HTN, HLD, DM II, CAD, OSA, and MI.      SLP Plan  All goals met      Recommendations for follow up therapy are one component of a multi-disciplinary discharge planning process, led by the attending physician.  Recommendations may be updated based on patient status, additional functional criteria and insurance authorization.    Recommendations  Diet recommendations: Regular;Thin liquid Liquids provided via: Cup;Straw Medication Administration:  (as pt tolerates) Supervision: Patient able to self feed Compensations: Slow rate;Small sips/bites;Other (Comment) (rinse and expectorate or swallow with liquid when meal completed) Postural Changes and/or Swallow Maneuvers: Seated upright 90 degrees;Upright 30-60 min after meal                  Oral care BID     Dysphagia, oral phase (R13.11);Dysphagia, unspecified (R13.10)     All goals met     Chales Abrahams  11/04/2022, 8:10 PM

## 2022-11-04 NOTE — Telephone Encounter (Signed)
82 yr old seen in hospital for COPD, OSA, OHS, and chronic respiratory failure.  Previously seen by Dr. Vassie Loll.  Please arrange for hospital follow up visit with Dr. Vassie Loll or one of the NPs in 6 to 8 weeks.

## 2022-11-04 NOTE — Progress Notes (Addendum)
Patient's foley d/c'd during day shift yesterday. NT emptied 500 ml from purewick container at 1900. Patient didn't urinate during shift. Bladder scanned patient - 494 ml urine shown. Notified A. Virgel Manifold, NP. New order for in and out cath patient. Will do and continue to monitor.   1610 Micah Flesher to patient's room ready to I&O cath and she had urinated on her own. Notified A. Virgel Manifold, NP. Will continue to monitor and bladder scan patient every 6 hours.

## 2022-11-04 NOTE — Progress Notes (Signed)
NAME:  [name], MRN:  [id number], DOB:  Nov 30, 1940, LOS: 6 ADMISSION DATE:  10/28/2022, CONSULTATION DATE:  11/02/2022 REFERRING MD:  Dr. Jerolyn Center, [facility], CHIEF COMPLAINT:  Altered mental status   History of Present Illness:  82 yr old female smoker brought to [facility] ER with hallucinations and weakness.  She was started on therapy for a UTI and urine retention.  She was found to have hypercapnia.  She was started on Bipap.  She had improvement in mental status.  PCCM consulted to assist with management of respiratory failure.  Previously followed in pulmonary office by Dr. Vassie Loll.  Pertinent  Medical History  Allergies, Arthritis, CVA, HFpEF, Fatty liver, Colon polyps, DJD, Depression, DM type 2, Diverticulosis, Gastroparesis, GERD, HLD, HTN, Migraine headache, Neuropathy, Osteoporosis, Seizures, ACE inhibitor cough  Studies:  PFT 03/08/08 >> FEV1 1.38 (56%), FEV1% 67, TLC 3.74 (66%), DLCO 57% PSG 05/18/09 >> AHI 106.2, SpO2 low 78% EEG 10/29/22 >> normal MRI brain 10/29/22 >> no acute process Echo 10/29/22 >> EF 55 to 60%, mild LVH ABG with Bipap and 30% FiO2 10/30/22 >> pH 7.36, PCO2 55, PO2 112  Interim History / Subjective:  Did better with NIV overnight.  Feels like anoro helps open her lungs and she is able to cough out phlegm better.  Objective   BP 136/66   Pulse 68   Temp 98 F (36.7 C) (Axillary)   Resp 20   Ht 5\' 6"  (1.676 m)   Wt 109 kg   SpO2 (!) 83%   BMI 38.79 kg/m    Intake/Output Summary (Last 24 hours) at 11/04/2022 1043 Last data filed at 11/04/2022 0900 Gross per 24 hour  Intake 717 ml  Output 900 ml  Net -183 ml    Examination:  General - alert, sitting in a chair Eyes - pupils reactive ENT - no sinus tenderness, no stridor Cardiac - regular rate/rhythm, no murmur Chest - equal breath sounds b/l, no wheezing or rales Abdomen - soft, non tender, + bowel sounds Extremities - no cyanosis, clubbing, or edema Skin - no rashes Neuro - normal  strength, moves extremities, follows commands Psych - normal mood and behavior Assessment & Plan:   Acute on chronic hypoxic/hypercapnic respiratory failure. - from COPD, sleep disordered breathing, and hypoventilation with morbid obesity - goal SpO2 90 to 95% - arranging for NIV >> will be going to SNF before d/c home - likely will need repeat sleep testing as an outpt  COPD with chronic bronchitis. - continue anoro - prn albuterol - will need repeat PFT as an outpt  Tobacco abuse. - nicotine patch  17 mm right upper lobe lung nodule. - seen on neck CT from 09/12/22 - followed by Dr. Arbutus Ped with oncology as an outpatient - PET scan scheduled for 11/18/22  Goals of care. - DNR/DNI  Acute metabolic encephalopathy. UTI with Citrobacter. Acute urine retention. Seizure disorder. Fatty liver. Gastroparesis. Chronic HFpEF. HTN. DM type 2. - per primary team  Will arrange for outpt pulmonary follow up in 6 to 8 weeks.  Please call if additional assistance is needed while she is in hospital.  Labs       Latest Ref Rng & Units 11/03/2022    4:07 AM 11/01/2022    3:16 AM 10/30/2022    2:58 AM  CMP  Glucose 70 - 99 mg/dL 409  811  80   BUN 8 - 23 mg/dL 17  13  16    Creatinine 0.44 - 1.00 mg/dL  0.85  0.73  0.80   Sodium 135 - 145 mmol/L 134  137  138   Potassium 3.5 - 5.1 mmol/L 4.9  4.3  3.7   Chloride 98 - 111 mmol/L 99  101  104   CO2 22 - 32 mmol/L 30  28  28    Calcium 8.9 - 10.3 mg/dL 8.5  8.7  8.1   Total Protein 6.5 - 8.1 g/dL 5.6  6.2    Total Bilirubin 0.3 - 1.2 mg/dL 0.5  0.5    Alkaline Phos 38 - 126 U/L 75  96    AST 15 - 41 U/L 16  13    ALT 0 - 44 U/L 14  11         Latest Ref Rng & Units 11/03/2022    4:07 AM 11/01/2022    3:16 AM 10/30/2022    2:58 AM  CBC  WBC 4.0 - 10.5 K/uL 6.2  7.2  4.9   Hemoglobin 12.0 - 15.0 g/dL 9.3  30.8  9.8   Hematocrit 36.0 - 46.0 % 28.9  34.4  32.3   Platelets 150 - 400 K/uL 196  228  193     ABG    Component Value  Date/Time   PHART 7.36 10/30/2022 0807   PCO2ART 55 (H) 10/30/2022 0807   PO2ART 112 (H) 10/30/2022 0807   HCO3 31.1 (H) 10/30/2022 0807   TCO2 31 05/14/2022 0605   O2SAT 100 10/30/2022 0807   CBG (last 3)  Recent Labs    11/03/22 2020 11/03/22 2216 11/04/22 0730  GLUCAP 217* 172* 151*   Signature:  Coralyn Helling, MD [facility] Pager - [phone] or [phone] 11/04/2022, 10:43 AM     p

## 2022-11-04 NOTE — TOC Progression Note (Addendum)
Transition of Care [facility]) - Progression Note    Patient Details  Name: [name] MRN: [id number] Date of Birth: 10-28-1940  Transition of [facility] CM/SW Contact  Howell Rucks, RN Phone Number: 11/04/2022, 10:57 AM  Clinical Narrative:  Bed offers presented to pt's dtr Gershon Cull), confirmed [facility] for short term rehab-SNF, will initiate insurance auth   -11:50am Insurance auth initiaited via [place]. Auth ID [id number], pending authorization.   -1:01pm Per Fransico Him, authorization approved. Plan Auth ID: [id number], days approved: 11/04/2022-11/08/2022. Team notified.     Expected Discharge Plan: Home/Self Care Barriers to Discharge: Continued Medical Work up  Expected Discharge Plan and Services   Discharge Planning Services: CM Consult Post Acute Care Choice: Resumption of Svcs/PTA Provider ([name]) Living arrangements for the past 2 months: Apartment                                       Social Determinants of Health (SDOH) Interventions SDOH Screenings   Food Insecurity: No Food Insecurity (10/31/2022)  Housing: Low Risk  (10/31/2022)  Transportation Needs: No Transportation Needs (10/31/2022)  Utilities: Not At Risk (10/31/2022)  Alcohol Screen: Low Risk  (06/07/2022)  Physical Activity: Inactive (06/07/2022)  Tobacco Use: High Risk (10/28/2022)    Readmission Risk Interventions    11/02/2022    2:16 PM  Readmission Risk Prevention Plan  Transportation Screening Complete  PCP or Specialist Appt within 5-7 Days Complete  Home Care Screening Complete  Medication Review (RN CM) Complete

## 2022-11-04 NOTE — Progress Notes (Signed)
Progress Note    [place]   VHQ:[id number]  DOB: 03/14/1941  DOA: 10/28/2022     6 PCP: System, Provider Not In  Initial CC: generalized pain, AMS  Hospital Course: [name] is an 82 yo female with PMH  allergic rhinitis, osteoarthritis, history of CVA with residual right-sided hemiparesis, chronic dCHF, cholelithiasis, chronic back pain, liver cirrhosis, colon polyps, COPD, depression, DMII, diverticulosis, gastritis, gastroparesis, GERD, history of headaches, HLD, HTN, hx MI, diabetic neuropathy, class II obesity, OSA not on CPAP, osteoporosis, seizure disorder. She was brought to the ER with generalized pain, AMS, hallucinations.  She also has been following with Dr. 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  She was moved to stepdown unit placed on BiPAP ABG consistent with hypercapnia.  She was admitted for acute metabolic encephalopathy with hypoxic hypercarbic respiratory failure. She endorsed smoking ~3 PPD on admission as well.  VBG was compatible with hypercarbia on admission.  She was placed on BiPAP and pulmonology was also consulted.  Interval History:  No events overnight. Patient still feeling okay this am. States she's ready for rehab.   Assessment and Plan:  Acute metabolic encephalopathy - resolved  -Suspected multifactorial from hypercarbia, UTI - negative EEG - Ucx noted with citrobacter; continue abx course to completion  - s/p BIPAP with improvement in mentation  - s/p foley for acute retention  Acute hypoxic hypercarbic respiratory failure COPD OSA - History of underlying COPD with OSA - Pulmonology following, appreciate assistance -Plan is for BiPAP at discharge - Also needs outpatient sleep study per pulmonology -Continue Anoro Ellipta   History of epilepsy - continue Keppra and gabapentin   Acute urinary retention - resolved  UTI - UCx with Citrobacter - continue rocephin   - s/p foley in ER - d/c foley on 7/17 and perform bladder scans; continues to void adequately    History of  Cirrhosis/gastroparesis -  followed by [facility] as outpatient.   Chronic dCHF - diastolic heart failure stable follow-up echo ejection fraction 55 to 60%.  Continue Lasix.   HTN - continue amlodipine, lopressor, lasix    HLD - on lipitor at home  DMII - continue SSI and CBG monitoring   Morbid obesity - complicates overall prognosis and management   Hx CVA - continue statin, aspirin, and plavix   RUL lung nodule - following with Dr. Arbutus Ped - has PET scheduled for 11/18/22  Tobacco dependence - continue nicotine patch; smoking approx 3 PPD on admission   Old records reviewed in assessment of this patient  Antimicrobials: Rocephin 7/11 >> 7/17   DVT prophylaxis:  enoxaparin (LOVENOX) injection 40 mg Start: 10/28/22 2200   Code Status:   Code Status: DNR  Mobility Assessment (Last 72 Hours)     Mobility Assessment     Row Name 11/03/22 1959 11/03/22 0811 11/02/22 2331 11/02/22 1352 11/02/22 1104   Does patient have an order for bedrest or is patient medically unstable No - Continue assessment No - Continue assessment No - Continue assessment No - Continue assessment --   What is the highest level of mobility based on the progressive mobility assessment? Level 2 (Chairfast) - Balance while sitting on edge of bed and cannot stand Level 1 (Bedfast) - Unable to balance while sitting on edge of bed Level 1 (Bedfast) - Unable to balance while sitting on edge of bed Level 1 (Bedfast) - Unable to balance while sitting on  edge of bed Level 3 (Stands with assist) - Balance while standing  and cannot march in place   Is the above level different from baseline mobility prior to current illness? Yes - Recommend PT order Yes - Recommend PT order Yes - Recommend PT order Yes - Recommend PT order --    Row Name 11/02/22 0800           Does patient have an order for  bedrest or is patient medically unstable No - Continue assessment       What is the highest level of mobility based on the progressive mobility assessment? Level 2 (Chairfast) - Balance while sitting on edge of bed and cannot stand       Is the above level different from baseline mobility prior to current illness? Yes - Recommend PT order               Mobility Assessment (Last 72 Hours)     Mobility Assessment     Row Name 11/03/22 1959 11/03/22 0811 11/02/22 2331 11/02/22 1352 11/02/22 1104   Does patient have an order for bedrest or is patient medically unstable No - Continue assessment No - Continue assessment No - Continue assessment No - Continue assessment --   What is the highest level of mobility based on the progressive mobility assessment? Level 2 (Chairfast) - Balance while sitting on edge of bed and cannot stand Level 1 (Bedfast) - Unable to balance while sitting on edge of bed Level 1 (Bedfast) - Unable to balance while sitting on edge of bed Level 1 (Bedfast) - Unable to balance while sitting on edge of bed Level 3 (Stands with assist) - Balance while standing  and cannot march in place   Is the above level different from baseline mobility prior to current illness? Yes - Recommend PT order Yes - Recommend PT order Yes - Recommend PT order Yes - Recommend PT order --    Row Name 11/02/22 0800           Does patient have an order for bedrest or is patient medically unstable No - Continue assessment       What is the highest level of mobility based on the progressive mobility assessment? Level 2 (Chairfast) - Balance while sitting on edge of bed and cannot stand       Is the above level different from baseline mobility prior to current illness? Yes - Recommend PT order               Mobility Assessment (Last 72 Hours)     Mobility Assessment     Row Name 11/03/22 1959 11/03/22 0811 11/02/22 2331 11/02/22 1352 11/02/22 1104   Does patient have an order for bedrest or  is patient medically unstable No - Continue assessment No - Continue assessment No - Continue assessment No - Continue assessment --   What is the highest level of mobility based on the progressive mobility assessment? Level 2 (Chairfast) - Balance while sitting on edge of bed and cannot stand Level 1 (Bedfast) - Unable to balance while sitting on edge of bed Level 1 (Bedfast) - Unable to balance while sitting on edge of bed Level 1 (Bedfast) - Unable to balance while sitting on edge of bed Level 3 (Stands with assist) - Balance while standing  and cannot march in place   Is the above level different from baseline mobility prior to current illness? Yes - Recommend PT order Yes - Recommend  PT order Yes - Recommend PT order Yes - Recommend PT order --    Row Name 11/02/22 0800           Does patient have an order for bedrest or is patient medically unstable No - Continue assessment       What is the highest level of mobility based on the progressive mobility assessment? Level 2 (Chairfast) - Balance while sitting on edge of bed and cannot stand       Is the above level different from baseline mobility prior to current illness? Yes - Recommend PT order                Barriers to discharge: none Disposition Plan:  SNF Status is: Inpt  Objective: Blood pressure 136/66, pulse 68, temperature 98 F (36.7 C), temperature source Axillary, resp. rate 20, height 5\' 6"  (1.676 m), weight 109 kg, SpO2 (!) 83%.  Examination:  Physical Exam Constitutional:      Appearance: Normal appearance.  HENT:     Head: Normocephalic and atraumatic.     Mouth/Throat:     Mouth: Mucous membranes are moist.  Eyes:     Extraocular Movements: Extraocular movements intact.  Cardiovascular:     Rate and Rhythm: Normal rate and regular rhythm.  Pulmonary:     Effort: Pulmonary effort is normal.     Breath sounds: Normal breath sounds.  Abdominal:     General: Bowel sounds are normal. There is no distension.      Palpations: Abdomen is soft.     Tenderness: There is no abdominal tenderness.  Musculoskeletal:        General: Normal range of motion.     Cervical back: Normal range of motion and neck supple.  Skin:    General: Skin is warm and dry.  Neurological:     General: No focal deficit present.     Mental Status: She is alert.  Psychiatric:        Mood and Affect: Mood normal.      Consultants:  Pulmonology   Procedures:    Data Reviewed: Results for orders placed or performed during the hospital encounter of 10/28/22 (from the past 24 hour(s))  Glucose, capillary     Status: Abnormal   Collection Time: 11/03/22  4:22 PM  Result Value Ref Range   Glucose-Capillary 221 (H) 70 - 99 mg/dL  Glucose, capillary     Status: Abnormal   Collection Time: 11/03/22  8:20 PM  Result Value Ref Range   Glucose-Capillary 217 (H) 70 - 99 mg/dL  Glucose, capillary     Status: Abnormal   Collection Time: 11/03/22 10:16 PM  Result Value Ref Range   Glucose-Capillary 172 (H) 70 - 99 mg/dL  Glucose, capillary     Status: Abnormal   Collection Time: 11/04/22  7:30 AM  Result Value Ref Range   Glucose-Capillary 151 (H) 70 - 99 mg/dL  Glucose, capillary     Status: Abnormal   Collection Time: 11/04/22 11:52 AM  Result Value Ref Range   Glucose-Capillary 188 (H) 70 - 99 mg/dL    I have reviewed pertinent nursing notes, vitals, labs, and images as necessary. I have ordered labwork to follow up on as indicated.  I have reviewed the last notes from staff over past 24 hours. I have discussed patient's care plan and test results with nursing staff, CM/SW, and other staff as appropriate.  Time spent: Greater than 50% of the 55 minute visit was  spent in counseling/coordination of care for the patient as laid out in the A&P.   LOS: 6 days   Lewie Chamber, MD [facility] 11/04/2022, 12:31 PM

## 2022-11-04 NOTE — Plan of Care (Signed)
  Problem: Education: Goal: Ability to describe self-care measures that may prevent or decrease complications (Diabetes Survival Skills Education) will improve Outcome: Not Progressing Goal: Individualized Educational Video(s) Outcome: Not Progressing   Problem: Education: Goal: Individualized Educational Video(s) Outcome: Not Progressing   Problem: Education: Goal: Individualized Educational Video(s) Outcome: Not Progressing

## 2022-11-05 DIAGNOSIS — Z7401 Bed confinement status: Secondary | ICD-10-CM | POA: Diagnosis not present

## 2022-11-05 DIAGNOSIS — K5900 Constipation, unspecified: Secondary | ICD-10-CM | POA: Diagnosis not present

## 2022-11-05 DIAGNOSIS — M19012 Primary osteoarthritis, left shoulder: Secondary | ICD-10-CM | POA: Diagnosis not present

## 2022-11-05 DIAGNOSIS — N390 Urinary tract infection, site not specified: Secondary | ICD-10-CM | POA: Diagnosis not present

## 2022-11-05 DIAGNOSIS — I2510 Atherosclerotic heart disease of native coronary artery without angina pectoris: Secondary | ICD-10-CM | POA: Diagnosis not present

## 2022-11-05 DIAGNOSIS — K644 Residual hemorrhoidal skin tags: Secondary | ICD-10-CM | POA: Diagnosis not present

## 2022-11-05 DIAGNOSIS — A499 Bacterial infection, unspecified: Secondary | ICD-10-CM | POA: Diagnosis not present

## 2022-11-05 DIAGNOSIS — R531 Weakness: Secondary | ICD-10-CM | POA: Diagnosis not present

## 2022-11-05 DIAGNOSIS — K7460 Unspecified cirrhosis of liver: Secondary | ICD-10-CM | POA: Diagnosis not present

## 2022-11-05 DIAGNOSIS — J9621 Acute and chronic respiratory failure with hypoxia: Secondary | ICD-10-CM | POA: Diagnosis not present

## 2022-11-05 DIAGNOSIS — G43009 Migraine without aura, not intractable, without status migrainosus: Secondary | ICD-10-CM | POA: Diagnosis not present

## 2022-11-05 DIAGNOSIS — Z23 Encounter for immunization: Secondary | ICD-10-CM | POA: Diagnosis not present

## 2022-11-05 DIAGNOSIS — N1831 Chronic kidney disease, stage 3a: Secondary | ICD-10-CM | POA: Diagnosis not present

## 2022-11-05 DIAGNOSIS — J9691 Respiratory failure, unspecified with hypoxia: Secondary | ICD-10-CM | POA: Diagnosis not present

## 2022-11-05 DIAGNOSIS — K3184 Gastroparesis: Secondary | ICD-10-CM | POA: Diagnosis not present

## 2022-11-05 DIAGNOSIS — G4733 Obstructive sleep apnea (adult) (pediatric): Secondary | ICD-10-CM | POA: Diagnosis not present

## 2022-11-05 DIAGNOSIS — Z91199 Patient's noncompliance with other medical treatment and regimen due to unspecified reason: Secondary | ICD-10-CM | POA: Diagnosis not present

## 2022-11-05 DIAGNOSIS — G9341 Metabolic encephalopathy: Secondary | ICD-10-CM | POA: Diagnosis not present

## 2022-11-05 DIAGNOSIS — F339 Major depressive disorder, recurrent, unspecified: Secondary | ICD-10-CM | POA: Diagnosis not present

## 2022-11-05 DIAGNOSIS — R5383 Other fatigue: Secondary | ICD-10-CM | POA: Diagnosis not present

## 2022-11-05 DIAGNOSIS — I509 Heart failure, unspecified: Secondary | ICD-10-CM | POA: Diagnosis not present

## 2022-11-05 DIAGNOSIS — R918 Other nonspecific abnormal finding of lung field: Secondary | ICD-10-CM | POA: Diagnosis not present

## 2022-11-05 DIAGNOSIS — J449 Chronic obstructive pulmonary disease, unspecified: Secondary | ICD-10-CM | POA: Diagnosis not present

## 2022-11-05 DIAGNOSIS — R569 Unspecified convulsions: Secondary | ICD-10-CM | POA: Diagnosis not present

## 2022-11-05 DIAGNOSIS — G40909 Epilepsy, unspecified, not intractable, without status epilepticus: Secondary | ICD-10-CM | POA: Diagnosis not present

## 2022-11-05 DIAGNOSIS — R911 Solitary pulmonary nodule: Secondary | ICD-10-CM | POA: Diagnosis present

## 2022-11-05 DIAGNOSIS — J9601 Acute respiratory failure with hypoxia: Secondary | ICD-10-CM | POA: Diagnosis not present

## 2022-11-05 DIAGNOSIS — M6281 Muscle weakness (generalized): Secondary | ICD-10-CM | POA: Diagnosis not present

## 2022-11-05 DIAGNOSIS — J4489 Other specified chronic obstructive pulmonary disease: Secondary | ICD-10-CM | POA: Diagnosis not present

## 2022-11-05 DIAGNOSIS — J9602 Acute respiratory failure with hypercapnia: Secondary | ICD-10-CM | POA: Diagnosis not present

## 2022-11-05 DIAGNOSIS — R338 Other retention of urine: Secondary | ICD-10-CM | POA: Diagnosis not present

## 2022-11-05 DIAGNOSIS — G894 Chronic pain syndrome: Secondary | ICD-10-CM | POA: Diagnosis not present

## 2022-11-05 DIAGNOSIS — E785 Hyperlipidemia, unspecified: Secondary | ICD-10-CM | POA: Diagnosis not present

## 2022-11-05 DIAGNOSIS — R4589 Other symptoms and signs involving emotional state: Secondary | ICD-10-CM | POA: Diagnosis not present

## 2022-11-05 DIAGNOSIS — D649 Anemia, unspecified: Secondary | ICD-10-CM | POA: Diagnosis not present

## 2022-11-05 DIAGNOSIS — I10 Essential (primary) hypertension: Secondary | ICD-10-CM | POA: Diagnosis not present

## 2022-11-05 DIAGNOSIS — R112 Nausea with vomiting, unspecified: Secondary | ICD-10-CM | POA: Diagnosis not present

## 2022-11-05 DIAGNOSIS — I959 Hypotension, unspecified: Secondary | ICD-10-CM | POA: Diagnosis not present

## 2022-11-05 DIAGNOSIS — L853 Xerosis cutis: Secondary | ICD-10-CM | POA: Diagnosis not present

## 2022-11-05 DIAGNOSIS — E119 Type 2 diabetes mellitus without complications: Secondary | ICD-10-CM | POA: Diagnosis not present

## 2022-11-05 DIAGNOSIS — R262 Difficulty in walking, not elsewhere classified: Secondary | ICD-10-CM | POA: Diagnosis not present

## 2022-11-05 DIAGNOSIS — E1142 Type 2 diabetes mellitus with diabetic polyneuropathy: Secondary | ICD-10-CM | POA: Diagnosis not present

## 2022-11-05 LAB — GLUCOSE, CAPILLARY
Glucose-Capillary: 212 mg/dL — ABNORMAL HIGH (ref 70–99)
Glucose-Capillary: 133 mg/dL — ABNORMAL HIGH (ref 70–99)

## 2022-11-05 MED ORDER — INSULIN ASPART 100 UNIT/ML IJ SOLN: 0.0000 [IU] | Freq: Three times a day (TID) | INTRAMUSCULAR | Status: AC

## 2022-11-05 MED ORDER — POTASSIUM CHLORIDE CRYS ER 20 MEQ PO TBCR: 20.0000 meq | EXTENDED_RELEASE_TABLET | Freq: Every day | ORAL | Status: DC

## 2022-11-05 MED ORDER — METOPROLOL TARTRATE 25 MG PO TABS: 12.5000 mg | ORAL_TABLET | Freq: Two times a day (BID) | ORAL | Status: DC

## 2022-11-05 MED ORDER — ADULT MULTIVITAMIN W/MINERALS CH: 1.0000 | ORAL_TABLET | Freq: Every day | ORAL | Status: AC

## 2022-11-05 MED ORDER — UMECLIDINIUM-VILANTEROL 62.5-25 MCG/ACT IN AEPB: 1.0000 | INHALATION_SPRAY | Freq: Every day | RESPIRATORY_TRACT | Status: DC

## 2022-11-05 MED ORDER — ACETAMINOPHEN 325 MG PO TABS: 650.0000 mg | ORAL_TABLET | ORAL | Status: DC | PRN

## 2022-11-05 MED ORDER — HYDRALAZINE HCL 25 MG PO TABS: 25.0000 mg | ORAL_TABLET | Freq: Three times a day (TID) | ORAL | Status: DC

## 2022-11-05 MED ORDER — LANTUS SOLOSTAR 100 UNIT/ML ~~LOC~~ SOPN: 10.0000 [IU] | PEN_INJECTOR | Freq: Every day | SUBCUTANEOUS | Status: DC

## 2022-11-05 MED ORDER — NICOTINE 14 MG/24HR TD PT24: 14.0000 mg | MEDICATED_PATCH | Freq: Every day | TRANSDERMAL | Status: DC

## 2022-11-05 NOTE — Plan of Care (Signed)
Problem: Education: Goal: Ability to describe self-care measures that may prevent or decrease complications (Diabetes Survival Skills Education) will improve Outcome: Adequate for Discharge Goal: Individualized Educational Video(s) Outcome: Adequate for Discharge   Problem: Coping: Goal: Ability to adjust to condition or change in health will improve Outcome: Adequate for Discharge   Problem: Fluid Volume: Goal: Ability to maintain a balanced intake and output will improve Outcome: Adequate for Discharge   Problem: Health Behavior/Discharge Planning: Goal: Ability to identify and utilize available resources and services will improve Outcome: Adequate for Discharge Goal: Ability to manage health-related needs will improve Outcome: Adequate for Discharge   Problem: Metabolic: Goal: Ability to maintain appropriate glucose levels will improve Outcome: Adequate for Discharge   Problem: Nutritional: Goal: Maintenance of adequate nutrition will improve Outcome: Adequate for Discharge Goal: Progress toward achieving an optimal weight will improve Outcome: Adequate for Discharge   Problem: Skin Integrity: Goal: Risk for impaired skin integrity will decrease Outcome: Adequate for Discharge   Problem: Tissue Perfusion: Goal: Adequacy of tissue perfusion will improve Outcome: Adequate for Discharge   Problem: Education: Goal: Knowledge of General Education information will improve Description: Including pain rating scale, medication(s)/side effects and non-pharmacologic comfort measures Outcome: Adequate for Discharge   Problem: Health Behavior/Discharge Planning: Goal: Ability to manage health-related needs will improve Outcome: Adequate for Discharge   Problem: Clinical Measurements: Goal: Ability to maintain clinical measurements within normal limits will improve Outcome: Adequate for Discharge Goal: Will remain free from infection Outcome: Adequate for Discharge Goal:  Diagnostic test results will improve Outcome: Adequate for Discharge Goal: Respiratory complications will improve Outcome: Adequate for Discharge Goal: Cardiovascular complication will be avoided Outcome: Adequate for Discharge   Problem: Activity: Goal: Risk for activity intolerance will decrease Outcome: Adequate for Discharge   Problem: Nutrition: Goal: Adequate nutrition will be maintained Outcome: Adequate for Discharge   Problem: Coping: Goal: Level of anxiety will decrease Outcome: Adequate for Discharge   Problem: Elimination: Goal: Will not experience complications related to bowel motility Outcome: Adequate for Discharge Goal: Will not experience complications related to urinary retention Outcome: Adequate for Discharge   Problem: Pain Managment: Goal: General experience of comfort will improve Outcome: Adequate for Discharge   Problem: Safety: Goal: Ability to remain free from injury will improve Outcome: Adequate for Discharge   Problem: Skin Integrity: Goal: Risk for impaired skin integrity will decrease Outcome: Adequate for Discharge   Problem: Education: Goal: Knowledge of General Education information will improve Description: Including pain rating scale, medication(s)/side effects and non-pharmacologic comfort measures Outcome: Adequate for Discharge   Problem: Health Behavior/Discharge Planning: Goal: Ability to manage health-related needs will improve Outcome: Adequate for Discharge   Problem: Clinical Measurements: Goal: Ability to maintain clinical measurements within normal limits will improve Outcome: Adequate for Discharge Goal: Will remain free from infection Outcome: Adequate for Discharge Goal: Diagnostic test results will improve Outcome: Adequate for Discharge Goal: Respiratory complications will improve Outcome: Adequate for Discharge Goal: Cardiovascular complication will be avoided Outcome: Adequate for Discharge   Problem:  Activity: Goal: Risk for activity intolerance will decrease Outcome: Adequate for Discharge   Problem: Nutrition: Goal: Adequate nutrition will be maintained Outcome: Adequate for Discharge   Problem: Coping: Goal: Level of anxiety will decrease Outcome: Adequate for Discharge   Problem: Elimination: Goal: Will not experience complications related to bowel motility Outcome: Adequate for Discharge Goal: Will not experience complications related to urinary retention Outcome: Adequate for Discharge   Problem: Pain Managment: Goal: General experience of comfort   will improve Outcome: Adequate for Discharge   Problem: Safety: Goal: Ability to remain free from injury will improve Outcome: Adequate for Discharge   Problem: Skin Integrity: Goal: Risk for impaired skin integrity will decrease Outcome: Adequate for Discharge   

## 2022-11-05 NOTE — TOC Progression Note (Signed)
Transition of Care [facility]) - Progression Note    Patient Details  Name: [name] MRN: [id number] Date of Birth: [birth year]  [facility] of [facility]) CM/SW Contact  Geni Bers, RN Phone Number: 11/05/2022, 11:38 AM  Clinical Narrative:    [name] was called for transportation to [facility].     Expected Discharge Plan: Home/Self Care Barriers to Discharge: Continued Medical Work up  Expected Discharge Plan and Services   Discharge Planning Services: CM Consult Post Acute Care Choice: Resumption of Svcs/PTA Provider [facility]) Living arrangements for the past 2 months: Apartment Expected Discharge Date: 11/05/22                                     Social Determinants of Health (SDOH) Interventions SDOH Screenings   Food Insecurity: No Food Insecurity (10/31/2022)  Housing: Low Risk  (10/31/2022)  Transportation Needs: No Transportation Needs (10/31/2022)  Utilities: Not At Risk (10/31/2022)  Alcohol Screen: Low Risk  (06/07/2022)  Physical Activity: Inactive (06/07/2022)  Tobacco Use: High Risk (10/28/2022)    Readmission Risk Interventions    11/02/2022    2:16 PM  Readmission Risk Prevention Plan  Transportation Screening Complete  PCP or Specialist Appt within 5-7 Days Complete  Home Care Screening Complete  Medication Review (RN CM) Complete

## 2022-11-05 NOTE — Progress Notes (Signed)
Attempted to call report @ 13:20. Val [place]

## 2022-11-05 NOTE — Discharge Summary (Addendum)
Physician Discharge Summary   [name] ZOX:[id number] DOB: [birth year] DOA: 10/28/2022  PCP: System, Provider Not In  Admit date: 10/28/2022 Discharge date: 11/05/2022  Admitted From: Home Disposition:  [facility] Discharging physician: Lewie Chamber, MD Barriers to discharge: none  Recommendations at discharge: Follow up with pulmonology  PET scheduled for 8/1 per oncology    Discharge Condition: stable CODE STATUS: DNR Diet recommendation:  Diet Orders (From admission, onward)     Start     Ordered   11/02/22 1123  DIET DYS 3 Room service appropriate? Yes; Fluid consistency: Thin  Diet effective now       Comments: Extra gravies/sauces  Question Answer Comment  Room service appropriate? Yes   Fluid consistency: Thin      11/02/22 1122            Hospital Course: [name] is an 82 yo female with PMH  allergic rhinitis, osteoarthritis, history of CVA with residual right-sided hemiparesis, chronic dCHF, cholelithiasis, chronic back pain, liver cirrhosis, colon polyps, COPD, depression, DMII, diverticulosis, gastritis, gastroparesis, GERD, history of headaches, HLD, HTN, hx MI, diabetic neuropathy, class II obesity, OSA not on CPAP, osteoporosis, seizure disorder. She was brought to the ER with generalized pain, AMS, hallucinations.  She also has been following with Dr. 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  She was moved to stepdown unit placed on BiPAP ABG consistent with hypercapnia.  She was admitted for acute metabolic encephalopathy with hypoxic hypercarbic respiratory failure. She endorsed smoking ~3 PPD on admission as well.  VBG was compatible with hypercarbia on admission.  She was placed on BiPAP and pulmonology was also consulted.  Assessment and Plan:  Acute metabolic encephalopathy - resolved  -Suspected multifactorial from hypercarbia, UTI - negative EEG - Ucx noted with  citrobacter; continue abx course to completion  - s/p BIPAP with improvement in mentation  - s/p foley for acute retention; voided well after removal   Acute hypoxic hypercarbic respiratory failure COPD OSA - History of underlying COPD with OSA - Pulmonology following, appreciate assistance -Plan is for Trilogy at discharge - Also needs outpatient sleep study per pulmonology -Continue Anoro Ellipta   History of epilepsy - continue Keppra and gabapentin   Acute urinary retention - resolved  UTI - UCx with Citrobacter - continue rocephin  - s/p foley in ER - d/c foley on 7/17 and performed bladder scans; continues to void adequately    History of  Cirrhosis/gastroparesis -  followed by [facility] as outpatient.   Chronic dCHF - diastolic heart failure stable follow-up echo ejection fraction 55 to 60%.  Continue Lasix and KCL supplement   HTN - continue amlodipine, lopressor, lasix    HLD - on lipitor   DMII - continue SSI; dose lowered on basal    Morbid obesity - complicates overall prognosis and management   Hx CVA - continue statin, aspirin, and plavix   RUL lung nodule - following with Dr. Arbutus Ped - has PET scheduled for 11/18/22   Tobacco dependence - continue nicotine patch; smoking approx 3 PPD on admission  Principal Diagnosis: Acute metabolic encephalopathy  Discharge Diagnoses: Active Hospital Problems   Diagnosis Date Noted   Acute respiratory failure with hypoxia and hypercapnia (HCC) 11/03/2022    Priority: 2.   OSA (obstructive sleep apnea) 11/03/2022    Priority: 3.   Acute urinary retention 11/03/2022    Priority: 3.   Acute cystitis  11/03/2022    Priority: 3.   COPD 04/22/2009    Priority: 3.   History of epilepsy 11/03/2022   Type 2 diabetes mellitus (HCC) 10/28/2022   Epilepsy (HCC) 10/28/2022   CAD (coronary artery disease) 10/28/2022   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) 10/28/2022   Sleep disorder  06/20/2015   HLD (hyperlipidemia) 05/06/2015   Essential hypertension 05/06/2015   Chronic diastolic CHF (congestive heart failure) (HCC) 08/10/2012   Gastroparesis 03/15/2011   GERD 10/29/2006   Hepatic cirrhosis (HCC) 10/29/2006    Resolved Hospital Problems   Diagnosis Date Noted Date Resolved   Acute metabolic encephalopathy 10/28/2022 11/03/2022    Priority: 1.   Vomiting 11/22/2012 11/03/2022     Discharge Instructions     Increase activity slowly   Complete by: As directed       Allergies as of 11/05/2022       Reactions   Hydrocodone Other (See Comments)   Hallucination   Lisinopril Cough   Pioglitazone Swelling   edema   Varenicline Tartrate Other (See Comments)   Bad dreams        Medication List     STOP taking these medications    BC HEADACHE POWDER PO   diclofenac Sodium 1 % Gel Commonly known as: Voltaren   ibuprofen 200 MG tablet Commonly known as: ADVIL   polyethylene glycol 17 g packet Commonly known as: MIRALAX / GLYCOLAX   silver sulfADIAZINE 1 % cream Commonly known as: Silvadene   traMADol 100 MG 24 hr tablet Commonly known as: ULTRAM-ER   triamcinolone cream 0.1 % Commonly known as: KENALOG       TAKE these medications    acetaminophen 325 MG tablet Commonly known as: TYLENOL Take 2 tablets (650 mg total) by mouth every 4 (four) hours as needed for mild pain or moderate pain (or Fever >/= 101).   albuterol 108 (90 Base) MCG/ACT inhaler Commonly known as: Proventil HFA Inhale 2 puffs into the lungs every 4 (four) hours as needed for wheezing or shortness of breath.   amLODipine 5 MG tablet Commonly known as: NORVASC Take 5 mg by mouth daily.   aspirin EC 81 MG tablet Take 81 mg by mouth daily.   atorvastatin 40 MG tablet Commonly known as: Lipitor Take 1 tablet (40 mg total) by mouth daily.   CLEAR EYES COMPLETE OP Place 2 drops into both eyes daily.   clopidogrel 75 MG tablet Commonly known as: Plavix Take  1 tablet (75 mg total) by mouth daily.   furosemide 20 MG tablet Commonly known as: LASIX Take 40 mg by mouth daily.   gabapentin 300 MG capsule Commonly known as: NEURONTIN TAKE 1 CAPSULE(300 MG) BY MOUTH TWICE DAILY What changed: See the new instructions.   glucose blood test strip Commonly known as: OneTouch Verio 1 each by Other route 2 (two) times daily. And lancets 2/day   hydrALAZINE 25 MG tablet Commonly known as: APRESOLINE Take 1 tablet (25 mg total) by mouth every 8 (eight) hours.   insulin aspart 100 UNIT/ML injection Commonly known as: novoLOG Inject 0-15 Units into the skin 3 (three) times daily with meals.   ipratropium-albuterol 0.5-2.5 (3) MG/3ML Soln Commonly known as: DUONEB Take 3 mLs by nebulization every 6 (six) hours as needed. What changed: reasons to take this   Lantus SoloStar 100 UNIT/ML Solostar Pen Generic drug: insulin glargine Inject 10 Units into the skin daily. What changed: how much to take  levETIRAcetam 500 MG tablet Commonly known as: KEPPRA Take 1 tablet (500 mg total) by mouth 2 (two) times daily.   metoprolol tartrate 25 MG tablet Commonly known as: LOPRESSOR Take 0.5 tablets (12.5 mg total) by mouth 2 (two) times daily.   multivitamin with minerals Tabs tablet Take 1 tablet by mouth daily.   nicotine 14 mg/24hr patch Commonly known as: NICODERM CQ - dosed in mg/24 hours Place 1 patch (14 mg total) onto the skin daily.   potassium chloride SA 20 MEQ tablet Commonly known as: KLOR-CON M Take 1 tablet (20 mEq total) by mouth daily. What changed: when to take this   umeclidinium-vilanterol 62.5-25 MCG/ACT Aepb Commonly known as: ANORO ELLIPTA Inhale 1 puff into the lungs daily. Start taking on: November 06, 2022               Durable Medical Equipment  (From admission, onward)           Start     Ordered   11/03/22 1445  For home use only DME Other see comment  Once       Comments: [facility]  Question:  Length  of Need  Answer:  Lifetime   11/03/22 1444            Contact information for after-discharge care     Destination     [facility] Preferred SNF .   Service: Skilled Nursing Contact information: [address] [place] [address] [phone]                    Allergies  Allergen Reactions   Hydrocodone Other (See Comments)    Hallucination   Lisinopril Cough   Pioglitazone Swelling    edema   Varenicline Tartrate Other (See Comments)    Bad dreams    Consultations: Pulmonology   Procedures:   Discharge Exam: BP (!) 150/57 (BP Location: Right Arm)   Pulse 70   Temp 98.8 F (37.1 C) (Oral)   Resp 17   Ht 5\' 6"  (1.676 m)   Wt 109 kg   SpO2 95%   BMI 38.79 kg/m  Physical Exam Constitutional:      General: She is not in acute distress.    Appearance: Normal appearance.  HENT:     Head: Normocephalic and atraumatic.     Mouth/Throat:     Mouth: Mucous membranes are moist.  Eyes:     Extraocular Movements: Extraocular movements intact.  Cardiovascular:     Rate and Rhythm: Normal rate and regular rhythm.  Pulmonary:     Effort: Pulmonary effort is normal. No respiratory distress.     Breath sounds: Normal breath sounds. No wheezing.  Abdominal:     General: Bowel sounds are normal. There is no distension.     Palpations: Abdomen is soft.     Tenderness: There is no abdominal tenderness.  Musculoskeletal:        General: Normal range of motion.     Cervical back: Normal range of motion and neck supple.  Skin:    General: Skin is warm and dry.  Neurological:     General: No focal deficit present.     Mental Status: She is alert.  Psychiatric:        Mood and Affect: Mood normal.      The results of significant diagnostics from this hospitalization (including imaging, microbiology, ancillary and laboratory) are listed below for reference.   Microbiology: Recent Results (from the past  240 hour(s))  SARS  Coronavirus 2 by RT PCR (hospital order, performed in [facility] hospital lab) *cepheid single result test* Anterior Nasal Swab     Status: None   Collection Time: 10/28/22 10:37 AM   Specimen: Anterior Nasal Swab  Result Value Ref Range Status   SARS Coronavirus 2 by RT PCR NEGATIVE NEGATIVE Final    Comment: (NOTE) SARS-CoV-2 target nucleic acids are NOT DETECTED.  The SARS-CoV-2 RNA is generally detectable in upper and lower respiratory specimens during the acute phase of infection. The lowest concentration of SARS-CoV-2 viral copies this assay can detect is 250 copies / mL. A negative result does not preclude SARS-CoV-2 infection and should not be used as the sole basis for treatment or other patient management decisions.  A negative result may occur with improper specimen collection / handling, submission of specimen other than nasopharyngeal swab, presence of viral mutation(s) within the areas targeted by this assay, and inadequate number of viral copies (<250 copies / mL). A negative result must be combined with clinical observations, patient history, and epidemiological information.  Fact Sheet for Patients:   RoadLapTop.co.za  Fact Sheet for Healthcare Providers: http://kim-miller.com/  This test is not yet approved or  cleared by the [place] FDA and has been authorized for detection and/or diagnosis of SARS-CoV-2 by FDA under an Emergency Use Authorization (EUA).  This EUA will remain in effect (meaning this test can be used) for the duration of the COVID-19 declaration under Section 564(b)(1) of the Act, 21 U.S.C. section 360bbb-3(b)(1), unless the authorization is terminated or revoked sooner.  Performed at [facility], [name] [place], [place] [address]   Urine Culture     Status: Abnormal   Collection Time: 10/28/22  1:40 PM   Specimen: Urine, Catheterized  Result Value Ref Range  Status   Specimen Description   Final    URINE, CATHETERIZED Performed at [facility], [address], [place], [place] [address]    Special Requests   Final    NONE Performed at [facility], [address] [place], [place] [address]    Culture >=100,000 COLONIES/mL CITROBACTER AMALONATICUS (A)  Final   Report Status 10/30/2022 FINAL  Final   Organism ID, Bacteria CITROBACTER AMALONATICUS (A)  Final      Susceptibility   Citrobacter amalonaticus - MIC*    CEFEPIME <=0.12 SENSITIVE Sensitive     CEFTRIAXONE <=0.25 SENSITIVE Sensitive     CIPROFLOXACIN <=0.25 SENSITIVE Sensitive     GENTAMICIN <=1 SENSITIVE Sensitive     IMIPENEM <=0.25 SENSITIVE Sensitive     NITROFURANTOIN 64 INTERMEDIATE Intermediate     TRIMETH/SULFA <=20 SENSITIVE Sensitive     PIP/TAZO <=4 SENSITIVE Sensitive     * >=100,000 COLONIES/mL CITROBACTER AMALONATICUS  MRSA Next Gen by PCR, Nasal     Status: None   Collection Time: 10/29/22  6:55 PM   Specimen: Nasal Mucosa; Nasal Swab  Result Value Ref Range Status   MRSA by PCR Next Gen NOT DETECTED NOT DETECTED Final    Comment: (NOTE) The GeneXpert MRSA Assay (FDA approved for NASAL specimens only), is one component of a comprehensive MRSA colonization surveillance program. It is not intended to diagnose MRSA infection nor to guide or monitor treatment for MRSA infections. Test performance is not FDA approved in patients less than 100 years old. Performed at [facility], [address], [place], [place] [address]      Labs: BNP (  last 3 results) Recent Labs    09/12/22 0906 10/28/22 0957  BNP 75.7 62.8   Basic Metabolic Panel: Recent Labs  Lab 10/30/22 0258 11/01/22 0316 11/03/22 0407  NA 138 137 134*  K 3.7 4.3 4.9  CL 104 101 99  CO2 28 28 30   GLUCOSE 80 122* 128*  BUN 16 13 17   CREATININE 0.80 0.73 0.85  CALCIUM 8.1* 8.7* 8.5*   Liver Function Tests: Recent Labs  Lab  11/01/22 0316 11/03/22 0407  AST 13* 16  ALT 11 14  ALKPHOS 96 75  BILITOT 0.5 0.5  PROT 6.2* 5.6*  ALBUMIN 2.7* 2.3*   No results for input(s): "LIPASE", "AMYLASE" in the last 168 hours. No results for input(s): "AMMONIA" in the last 168 hours. CBC: Recent Labs  Lab 10/30/22 0258 11/01/22 0316 11/03/22 0407  WBC 4.9 7.2 6.2  NEUTROABS 2.6  --   --   HGB 9.8* 10.7* 9.3*  HCT 32.3* 34.4* 28.9*  MCV 100.0 95.8 92.6  PLT 193 228 196   Cardiac Enzymes: No results for input(s): "CKTOTAL", "CKMB", "CKMBINDEX", "TROPONINI" in the last 168 hours. BNP: Invalid input(s): "POCBNP" CBG: Recent Labs  Lab 11/04/22 1152 11/04/22 1235 11/04/22 1639 11/04/22 2219 11/05/22 0741  GLUCAP 188* 201* 272* 208* 212*   D-Dimer No results for input(s): "DDIMER" in the last 72 hours. Hgb A1c No results for input(s): "HGBA1C" in the last 72 hours. Lipid Profile No results for input(s): "CHOL", "HDL", "LDLCALC", "TRIG", "CHOLHDL", "LDLDIRECT" in the last 72 hours. Thyroid function studies No results for input(s): "TSH", "T4TOTAL", "T3FREE", "THYROIDAB" in the last 72 hours.  Invalid input(s): "FREET3" Anemia work up No results for input(s): "VITAMINB12", "FOLATE", "FERRITIN", "TIBC", "IRON", "RETICCTPCT" in the last 72 hours. Urinalysis    Component Value Date/Time   COLORURINE AMBER (A) 10/28/2022 1340   APPEARANCEUR HAZY (A) 10/28/2022 1340   LABSPEC 1.024 10/28/2022 1340   PHURINE 5.0 10/28/2022 1340   GLUCOSEU NEGATIVE 10/28/2022 1340   GLUCOSEU >=1000 (A) 05/31/2017 1022   HGBUR NEGATIVE 10/28/2022 1340   BILIRUBINUR NEGATIVE 10/28/2022 1340   KETONESUR 5 (A) 10/28/2022 1340   PROTEINUR 100 (A) 10/28/2022 1340   UROBILINOGEN 1.0 05/31/2017 1022   NITRITE NEGATIVE 10/28/2022 1340   LEUKOCYTESUR NEGATIVE 10/28/2022 1340   Sepsis Labs Recent Labs  Lab 10/30/22 0258 11/01/22 0316 11/03/22 0407  WBC 4.9 7.2 6.2   Microbiology Recent Results (from the past 240 hour(s))   SARS Coronavirus 2 by RT PCR (hospital order, performed in [facility] hospital lab) *cepheid single result test* Anterior Nasal Swab     Status: None   Collection Time: 10/28/22 10:37 AM   Specimen: Anterior Nasal Swab  Result Value Ref Range Status   SARS Coronavirus 2 by RT PCR NEGATIVE NEGATIVE Final    Comment: (NOTE) SARS-CoV-2 target nucleic acids are NOT DETECTED.  The SARS-CoV-2 RNA is generally detectable in upper and lower respiratory specimens during the acute phase of infection. The lowest concentration of SARS-CoV-2 viral copies this assay can detect is 250 copies / mL. A negative result does not preclude SARS-CoV-2 infection and should not be used as the sole basis for treatment or other patient management decisions.  A negative result may occur with improper specimen collection / handling, submission of specimen other than nasopharyngeal swab, presence of viral mutation(s) within the areas targeted by this assay, and inadequate number of viral copies (<250 copies / mL). A negative result must be combined with clinical observations, patient history,  and epidemiological information.  Fact Sheet for Patients:   RoadLapTop.co.za  Fact Sheet for Healthcare Providers: http://kim-miller.com/  This test is not yet approved or  cleared by the [place] FDA and has been authorized for detection and/or diagnosis of SARS-CoV-2 by FDA under an Emergency Use Authorization (EUA).  This EUA will remain in effect (meaning this test can be used) for the duration of the COVID-19 declaration under Section 564(b)(1) of the Act, 21 U.S.C. section 360bbb-3(b)(1), unless the authorization is terminated or revoked sooner.  Performed at [facility], [name] [place], [place] [address]   Urine Culture     Status: Abnormal   Collection Time: 10/28/22  1:40 PM   Specimen: Urine, Catheterized  Result Value Ref  Range Status   Specimen Description   Final    URINE, CATHETERIZED Performed at [facility], [address], [place], [place] [address]    Special Requests   Final    NONE Performed at [facility], [address], [place], [place] [address]    Culture >=100,000 COLONIES/mL CITROBACTER AMALONATICUS (A)  Final   Report Status 10/30/2022 FINAL  Final   Organism ID, Bacteria CITROBACTER AMALONATICUS (A)  Final      Susceptibility   Citrobacter amalonaticus - MIC*    CEFEPIME <=0.12 SENSITIVE Sensitive     CEFTRIAXONE <=0.25 SENSITIVE Sensitive     CIPROFLOXACIN <=0.25 SENSITIVE Sensitive     GENTAMICIN <=1 SENSITIVE Sensitive     IMIPENEM <=0.25 SENSITIVE Sensitive     NITROFURANTOIN 64 INTERMEDIATE Intermediate     TRIMETH/SULFA <=20 SENSITIVE Sensitive     PIP/TAZO <=4 SENSITIVE Sensitive     * >=100,000 COLONIES/mL CITROBACTER AMALONATICUS  MRSA Next Gen by PCR, Nasal     Status: None   Collection Time: 10/29/22  6:55 PM   Specimen: Nasal Mucosa; Nasal Swab  Result Value Ref Range Status   MRSA by PCR Next Gen NOT DETECTED NOT DETECTED Final    Comment: (NOTE) The GeneXpert MRSA Assay (FDA approved for NASAL specimens only), is one component of a comprehensive MRSA colonization surveillance program. It is not intended to diagnose MRSA infection nor to guide or monitor treatment for MRSA infections. Test performance is not FDA approved in patients less than 57 years old. Performed at [facility], [address] [place], [place] [address]     Procedures/Studies: DG HIPS BILAT WITH PELVIS 2V  Result Date: 10/30/2022 CLINICAL DATA:  Pelvic pain. EXAM: DG HIP (WITH OR WITHOUT PELVIS) 2V BILAT COMPARISON:  None Available. FINDINGS: Technically suboptimal images. There is no evidence of hip fracture or dislocation. There is no evidence of arthropathy or other focal bone abnormality. IMPRESSION: Negative. Electronically  Signed   By: Ted Mcalpine M.D.   On: 10/30/2022 18:22   DG Pelvis 1-2 Views  Result Date: 10/30/2022 CLINICAL DATA:  Pain to sacral area. EXAM: PELVIS - 1-2 VIEW COMPARISON:  Sep 12, 2022 FINDINGS: There is no evidence of pelvic fracture or diastasis. No pelvic bone lesions are seen. IMPRESSION: Negative. Electronically Signed   By: Ted Mcalpine M.D.   On: 10/30/2022 18:20   EEG adult  Result Date: 10/29/2022 Charlsie Quest, MD     10/29/2022  4:55 PM Patient Name: [name] MRN: [id number] Epilepsy Attending: Charlsie Quest Referring Physician/Provider: Alwyn Ren, MD Date: 10/29/2022 Duration: 21.23 mins Patient history: 82yo F with h/o epilepsy now with ams. EEG to evaluate for  seizure Level of alertness: Awake, asleep AEDs during EEG study: LEV, GBP Technical aspects: This EEG study was done with scalp electrodes positioned according to the 10-20 International system of electrode placement. Electrical activity was reviewed with band pass filter of 1-70Hz , sensitivity of 7 uV/mm, display speed of 73mm/sec with a 60Hz  notched filter applied as appropriate. EEG data were recorded continuously and digitally stored.  Video monitoring was available and reviewed as appropriate. Description: The posterior dominant rhythm consists of 8-9Hz  activity of moderate voltage (25-35 uV) seen predominantly in posterior head regions, symmetric and reactive to eye opening and eye closing. Sleep was characterized by vertex waves, sleep spindles (12 to 14 Hz), maximal frontocentral region. There is also 15 to 18 Hz  beta activity distributed symmetrically and diffusely. Hyperventilation and photic stimulation were not performed.   IMPRESSION: This study is within normal limits. No seizures or epileptiform discharges were seen throughout the recording. A normal interictal EEG does not exclude the diagnosis of epilepsy. Charlsie Quest   ECHOCARDIOGRAM COMPLETE  Result Date:  10/29/2022    ECHOCARDIOGRAM REPORT   Patient Name:   [name] Maturin Date of Exam: 10/29/2022 Medical Rec #:  [id number]               Height:       66.0 in Accession #:    [id number]              Weight:       240.3 lb Date of Birth:  07-Jun-1940               BSA:          2.162 m Patient Age:    81 years                BP:           157/89 mmHg Patient Gender: F                       HR:           50 bpm. Exam Location:  Inpatient Procedure: 2D Echo, Cardiac Doppler and Color Doppler Indications:    Abnormal ECG R94.31  History:        Patient has prior history of Echocardiogram examinations, most                 recent 01/28/2016. CHF, CAD, COPD; Risk Factors:Hypertension,                 Diabetes, Dyslipidemia and Current Smoker.  Sonographer:    Dondra Prader RVT Referring Phys: [id number] Buffie Herne MANUEL ORTIZ  Sonographer Comments: Technically challenging study due to limited acoustic windows, Technically difficult study due to poor echo windows, suboptimal parasternal window, suboptimal apical window, suboptimal subcostal window and patient is obese. Image acquisition challenging due to patient body habitus, Image acquisition challenging due to respiratory motion, Image acquisition challenging due to uncooperative patient and Image acquisition challenging due to COPD. Patient unable to tolerate exam. Exam ended. IMPRESSIONS  1. Overall poor image quality.  2. Left ventricular ejection fraction, by estimation, is 55 to 60%. The left ventricle has normal function. The left ventricle has no regional wall motion abnormalities. There is mild left ventricular hypertrophy. Left ventricular diastolic function could not be evaluated.  3. Right ventricular systolic function is normal. The right ventricular size is normal.  4. The mitral valve was not well visualized. No evidence of mitral valve regurgitation. Moderate  mitral annular calcification.  5. The aortic valve was not well visualized. There is mild  calcification of the aortic valve. There is mild thickening of the aortic valve. Aortic valve regurgitation is not visualized. Aortic valve sclerosis is present, with no evidence of aortic valve  stenosis. FINDINGS  Left Ventricle: Left ventricular ejection fraction, by estimation, is 55 to 60%. The left ventricle has normal function. The left ventricle has no regional wall motion abnormalities. The left ventricular internal cavity size was normal in size. There is  mild left ventricular hypertrophy. Left ventricular diastolic function could not be evaluated. Right Ventricle: The right ventricular size is normal. Right vetricular wall thickness was not assessed. Right ventricular systolic function is normal. Left Atrium: Left atrial size was not well visualized. Right Atrium: Right atrial size was not well visualized. Pericardium: There is no evidence of pericardial effusion. Mitral Valve: The mitral valve was not well visualized. There is mild thickening of the mitral valve leaflet(s). There is mild calcification of the mitral valve leaflet(s). Moderate mitral annular calcification. No evidence of mitral valve regurgitation. Tricuspid Valve: The tricuspid valve is not well visualized. Tricuspid valve regurgitation is trivial. Aortic Valve: The aortic valve was not well visualized. There is mild calcification of the aortic valve. There is mild thickening of the aortic valve. Aortic valve regurgitation is not visualized. Aortic valve sclerosis is present, with no evidence of aortic valve stenosis. Aortic valve mean gradient measures 3.0 mmHg. Aortic valve peak gradient measures 6.5 mmHg. Aortic valve area, by VTI measures 1.85 cm. Pulmonic Valve: The pulmonic valve was normal in structure. Pulmonic valve regurgitation is trivial. Aorta: The aortic root is normal in size and structure. IAS/Shunts: The interatrial septum was not well visualized. Additional Comments: Overall poor image quality.  LEFT VENTRICLE PLAX 2D  LVIDd:         4.40 cm LVIDs:         3.00 cm LV PW:         1.30 cm LV IVS:        1.30 cm LVOT diam:     1.80 cm LV SV:         53 LV SV Index:   25 LVOT Area:     2.54 cm  LEFT ATRIUM           Index LA diam:      3.40 cm 1.57 cm/m LA Vol (A4C): 30.4 ml 14.06 ml/m  AORTIC VALVE                    PULMONIC VALVE AV Area (Vmax):    1.78 cm     PV Vmax:       0.95 m/s AV Area (Vmean):   1.80 cm     PV Peak grad:  3.6 mmHg AV Area (VTI):     1.85 cm AV Vmax:           127.00 cm/s AV Vmean:          84.700 cm/s AV VTI:            0.288 m AV Peak Grad:      6.5 mmHg AV Mean Grad:      3.0 mmHg LVOT Vmax:         88.70 cm/s LVOT Vmean:        60.000 cm/s LVOT VTI:          0.209 m LVOT/AV VTI ratio: 0.73  AORTA Ao Root diam: 2.70 cm  MITRAL VALVE MV Area (PHT): 2.80 cm    SHUNTS MV Decel Time: 271 msec    Systemic VTI:  0.21 m MV E velocity: 75.80 cm/s  Systemic Diam: 1.80 cm MV A velocity: 78.80 cm/s MV E/A ratio:  0.96 Charlton Haws MD Electronically signed by Charlton Haws MD Signature Date/Time: 10/29/2022/12:39:17 PM    Final    MR BRAIN WO CONTRAST  Result Date: 10/29/2022 CLINICAL DATA:  Mental status change EXAM: MRI HEAD WITHOUT CONTRAST TECHNIQUE: Multiplanar, multiecho pulse sequences of the brain and surrounding structures were obtained without intravenous contrast. COMPARISON:  04/20/2019 MRI head common correlation is also made with 10/28/2022 CT head FINDINGS: Evaluation is limited by motion. Brain: No restricted diffusion to suggest acute or subacute infarct. No acute hemorrhage, mass, mass effect, or midline shift. No hydrocephalus or extra-axial collection. Partial empty sella. Normal craniocervical junction. No hemosiderin deposition to suggest remote hemorrhage. Scattered T2 hyperintense signal in the periventricular white matter, likely the sequela of mild chronic small vessel ischemic disease. Vascular: Normal arterial flow voids. Skull and upper cervical spine: Normal marrow signal.  Sinuses/Orbits: Clear paranasal sinuses. No acute finding in the orbits. Status post bilateral lens replacements. Other: The mastoid air cells are well aerated. IMPRESSION: Evaluation is limited by motion. Within this limitation, no acute intracranial process. Electronically Signed   By: Wiliam Ke M.D.   On: 10/29/2022 01:28   VAS [place] LOWER EXTREMITY VENOUS (DVT) (7a-7p)  Result Date: 10/28/2022  Lower Venous DVT Study Patient Name:  [name]  Date of Exam:   10/28/2022 Medical Rec #: [id number]                Accession #:    [id number] Date of Birth: [birth year]                Patient Gender: F Patient Age:   88 years Exam Location:  [facility] Procedure:      VAS [place] LOWER EXTREMITY VENOUS (DVT) Referring Phys: Lorin Picket G[place] --------------------------------------------------------------------------------  Indications: Swelling, and Pain.  Limitations: Body habitus, poor ultrasound/tissue interface and inability to tolerate compressions. Performing Technologist: McKayla Maag RVT, [place]  Examination Guidelines: A complete evaluation includes B-mode imaging, spectral Doppler, color Doppler, and power Doppler as needed of all accessible portions of each vessel. Bilateral testing is considered an integral part of a complete examination. Limited examinations for reoccurring indications may be performed as noted. The reflux portion of the exam is performed with the patient in reverse Trendelenburg.  +-----+---------------+---------+-----------+----------+-----------------------+ RIGHTCompressibilityPhasicitySpontaneityPropertiesThrombus Aging          +-----+---------------+---------+-----------+----------+-----------------------+ CFV                 Yes      Yes                  Patent by color doppler +-----+---------------+---------+-----------+----------+-----------------------+ SFJ                 Yes      Yes                  Patent by color doppler  +-----+---------------+---------+-----------+----------+-----------------------+   +--------+---------------+---------+-----------+----------+--------------------+ LEFT    CompressibilityPhasicitySpontaneityPropertiesThrombus Aging       +--------+---------------+---------+-----------+----------+--------------------+ CFV                    Yes      Yes                  Patent by color  doppler              +--------+---------------+---------+-----------+----------+--------------------+ SFJ                    Yes      Yes                  Patent by color                                                           doppler              +--------+---------------+---------+-----------+----------+--------------------+ FV Prox Full                                                              +--------+---------------+---------+-----------+----------+--------------------+ FV Mid  Full           Yes      Yes                                       +--------+---------------+---------+-----------+----------+--------------------+ FV                     Yes      Yes                  Patent by color      Distal                                               doppler              +--------+---------------+---------+-----------+----------+--------------------+ PFV     Full                                                              +--------+---------------+---------+-----------+----------+--------------------+ POP                    Yes      Yes                  Patent by color                                                           doppler              +--------+---------------+---------+-----------+----------+--------------------+ PTV                    Yes      Yes  Patent by color                                                           doppler               +--------+---------------+---------+-----------+----------+--------------------+ PERO                   Yes      Yes                  Patent by color                                                           doppler              +--------+---------------+---------+-----------+----------+--------------------+     Summary: RIGHT: - No evidence of common femoral vein obstruction.  LEFT: - There is no evidence of deep vein thrombosis in the lower extremity. However, portions of this examination were limited- see technologist comments above.  - No cystic structure found in the popliteal fossa.  *See table(s) above for measurements and observations. Electronically signed by Sherald Hess MD on 10/28/2022 at 3:47:32 PM.    Final    DG Femur Min 2 Views Left  Result Date: 10/28/2022 CLINICAL DATA:  thigh pain EXAM: LEFT FEMUR 2 VIEWS COMPARISON:  June 26, 2018 FINDINGS: Osteopenia. No acute fracture or dislocation. Compartmental degenerative changes most pronounced in the medial compartment. Femoroacetabular joint is obscured by overlapping soft tissues. No area of erosion or osseous destruction. No unexpected radiopaque foreign body. Soft tissues are unremarkable. IMPRESSION: 1. No acute fracture or dislocation. If there is a persistent clinical concern for nondisplaced hip or pelvic fracture, recommend dedicated pelvic CT or MRI. Electronically Signed   By: Meda Klinefelter M.D.   On: 10/28/2022 13:24   DG Chest 2 View  Result Date: 10/28/2022 CLINICAL DATA:  82 year old female with altered mental status, hallucination, recent lung cancer diagnosis. Generalized pain. EXAM: CHEST - 2 VIEW COMPARISON:  CT cervical spine 09/12/2022. Chest radiographs 09/12/2022 and earlier. FINDINGS: Semi upright AP and lateral views of the chest at 1008 hours. Right upper lobe lung nodule is visible (arrows). Mediastinal contours remain normal. Lung volumes are stable since 2021. No pneumothorax, pulmonary edema,  pleural effusion or other confluent lung opacity. No acute osseous abnormality identified. Negative visible bowel gas. IMPRESSION: 1. Suspicious right upper lobe lung nodule as demonstrated on Cervical CT 09/12/2022. 2. No new cardiopulmonary abnormality. Electronically Signed   By: Odessa Fleming M.D.   On: 10/28/2022 10:45   CT Head Wo Contrast  Result Date: 10/28/2022 CLINICAL DATA:  82 year old female with altered mental status, hallucination, recent lung cancer diagnosis. Generalized pain. EXAM: CT HEAD WITHOUT CONTRAST TECHNIQUE: Contiguous axial images were obtained from the base of the skull through the vertex without intravenous contrast. RADIATION DOSE REDUCTION: This exam was performed according to the departmental dose-optimization program which includes automated exposure control, adjustment of the mA and/or kV according to patient size and/or use of iterative reconstruction technique. COMPARISON:  Head CT 09/12/2022.  Brain MRI 04/20/2019. FINDINGS: Brain: Cerebral volume  is within normal limits for age. No midline shift, ventriculomegaly, mass effect, evidence of mass lesion, intracranial hemorrhage or evidence of cortically based acute infarction. Largely normal for age gray-white differentiation, stable since May including mild chronic right basal ganglia heterogeneity. Vascular: No suspicious intracranial vascular hyperdensity. Calcified atherosclerosis at the skull base. Skull: Hyperostosis.  No acute or suspicious osseous lesion. Sinuses/Orbits: Visualized paranasal sinuses and mastoids are stable and well aerated. Other: Visualized orbits and scalp soft tissues are within normal limits. IMPRESSION: No acute intracranial abnormality. Stable and largely negative for age noncontrast CT appearance of the brain. Electronically Signed   By: Odessa Fleming M.D.   On: 10/28/2022 10:43   VAS [place] LOWER EXTREMITY ARTERIAL DUPLEX  Result Date: 10/07/2022 LOWER EXTREMITY ARTERIAL DUPLEX STUDY Patient Name:   [name]  Date of Exam:   10/07/2022 Medical Rec #: [id number]                Accession #:    [id number] Date of Birth: 1940-12-05                Patient Gender: F Patient Age:   42 years Exam Location:  [facility] Procedure:      VAS [place] LOWER EXTREMITY ARTERIAL DUPLEX Referring Phys: Cristal Deer DICKSON --------------------------------------------------------------------------------   Vascular Interventions: 05/15/19: Left SFA and PTA angioplasty. Occluded ATA,                         Patent peroneal artery. Current ABI:            Right 0.75/0.51 Left 1.14/0.4 Limitations: Body habitus Comparison Study: none Performing Technologist: Thereasa Parkin RVT  Examination Guidelines: A complete evaluation includes B-mode imaging, spectral Doppler, color Doppler, and power Doppler as needed of all accessible portions of each vessel. Bilateral testing is considered an integral part of a complete examination. Limited examinations for reoccurring indications may be performed as note  +-----------+--------+-----+---------------+----------+--------+ LEFT       PSV cm/sRatioStenosis       Waveform  Comments +-----------+--------+-----+---------------+----------+--------+ CFA Distal 97                          triphasic          +-----------+--------+-----+---------------+----------+--------+ DFA        166                         biphasic           +-----------+--------+-----+---------------+----------+--------+ SFA Prox   202          30-49% stenosismonophasicprox/mid +-----------+--------+-----+---------------+----------+--------+ SFA Mid    341          50-74% stenosismonophasic         +-----------+--------+-----+---------------+----------+--------+ SFA Distal 130                         monophasic         +-----------+--------+-----+---------------+----------+--------+ POP Prox   48                          monophasic          +-----------+--------+-----+---------------+----------+--------+ ATA Distal 108                         monophasic         +-----------+--------+-----+---------------+----------+--------+ PTA  Distal 7                           monophasic         +-----------+--------+-----+---------------+----------+--------+ PERO Distal85                          monophasic         +-----------+--------+-----+---------------+----------+--------+ A focal velocity elevation of 202 cm/s was obtained at Prox/mid SFA with post stenotic turbulence with a VR of 1.6. Findings are characteristic of 30-49% stenosis. A 2nd focal velocity elevation was visualized, measuring 341 cm/s at mid SFA. Findings are  characteristic of 50-74% stenosis.  Summary: Right: 30-49 stenosis in the prox/mid SFA and 50-74% mid SFA stenosis.  See table(s) above for measurements and observations. Electronically signed by Waverly Ferrari MD on 10/07/2022 at 12:36:47 PM.    Final    VAS [place] ABI WITH/WO TBI  Result Date: 10/07/2022  LOWER EXTREMITY DOPPLER STUDY Patient Name:  [name]  Date of Exam:   10/07/2022 Medical Rec #: [id number]                Accession #:    [id number] Date of Birth: 1941/02/12                Patient Gender: F Patient Age:   58 years Exam Location:  [facility] Procedure:      VAS [place] ABI WITH/WO TBI Referring Phys: Cristal Deer DICKSON --------------------------------------------------------------------------------  Indications: Peripheral artery disease.  Vascular Interventions: 05/15/19: Left SFA and PTA angioplasty. Occluded ATA,                         Patent peroneal artery. Performing Technologist: Thereasa Parkin RVT  Examination Guidelines: A complete evaluation includes at minimum, Doppler waveform signals and systolic blood pressure reading at the level of bilateral brachial, anterior tibial, and posterior tibial arteries, when vessel segments are accessible. Bilateral testing  is considered an integral part of a complete examination. Photoelectric Plethysmograph (PPG) waveforms and toe systolic pressure readings are included as required and additional duplex testing as needed. Limited examinations for reoccurring indications may be performed as noted.  ABI Findings: +---------+------------------+-----+----------+--------+ Right    Rt Pressure (mmHg)IndexWaveform  Comment  +---------+------------------+-----+----------+--------+ Brachial 179                                       +---------+------------------+-----+----------+--------+ PTA      133               0.74 monophasic         +---------+------------------+-----+----------+--------+ DP       135               0.75 monophasic         +---------+------------------+-----+----------+--------+ Great Toe91                0.51                    +---------+------------------+-----+----------+--------+ +---------+------------------+-----+----------+-------+ Left     Lt Pressure (mmHg)IndexWaveform  Comment +---------+------------------+-----+----------+-------+ Brachial 170                                      +---------+------------------+-----+----------+-------+ PTA  194               1.08 monophasic        +---------+------------------+-----+----------+-------+ DP       204               1.14 monophasic        +---------+------------------+-----+----------+-------+ Great Toe71                0.40                   +---------+------------------+-----+----------+-------+ +-------+-----------+-----------+------------+------------+ ABI/TBIToday's ABIToday's TBIPrevious ABIPrevious TBI +-------+-----------+-----------+------------+------------+ Right  0.75       0.51       0.74        0.4          +-------+-----------+-----------+------------+------------+ Left   1.14       0.4        0.86        0.42          +-------+-----------+-----------+------------+------------+  Previous ABI on 07/01/22.  Summary: Right: Resting right ankle-brachial index indicates moderate right lower extremity arterial disease. Although ankle brachial indices are within normal limits (0.95-1.29), arterial Doppler waveforms at the ankle suggest some component of arterial occlusive disease. Left: Resting left ankle-brachial index is within normal range. Although ankle brachial indices are within normal limits (0.95-1.29), arterial Doppler waveforms at the ankle suggest some component of arterial occlusive disease. *See table(s) above for measurements and observations.  Electronically signed by Waverly Ferrari MD on 10/07/2022 at 12:36:34 PM.    Final      Time coordinating discharge: Over 30 minutes    Lewie Chamber, MD  [facility] 11/05/2022, 11:19 AM

## 2022-11-05 NOTE — Progress Notes (Signed)
I was walking by the patient's room when I heard the BiPAP alarming.  Upon entering the room I saw the tubing had become disconnected from the mask.  The patient's daughter stated that she had tried to connect it back but the patient was fighting her.  I talked with the patient regarding her need to wear it, even offering to let her drink something and then we could put it back on (since her chief complaint was that is was drying her out) but she was adamant that she was not putting it back on right now.

## 2022-11-07 DIAGNOSIS — J9621 Acute and chronic respiratory failure with hypoxia: Secondary | ICD-10-CM | POA: Diagnosis not present

## 2022-11-07 DIAGNOSIS — G9341 Metabolic encephalopathy: Secondary | ICD-10-CM | POA: Diagnosis not present

## 2022-11-07 DIAGNOSIS — I10 Essential (primary) hypertension: Secondary | ICD-10-CM | POA: Diagnosis not present

## 2022-11-07 DIAGNOSIS — I509 Heart failure, unspecified: Secondary | ICD-10-CM | POA: Diagnosis not present

## 2022-11-08 DIAGNOSIS — J9621 Acute and chronic respiratory failure with hypoxia: Secondary | ICD-10-CM | POA: Diagnosis not present

## 2022-11-08 DIAGNOSIS — K5900 Constipation, unspecified: Secondary | ICD-10-CM | POA: Diagnosis not present

## 2022-11-08 NOTE — Progress Notes (Signed)
This NN reached out to pt's daughter, Gershon Cull, to see if she would like the pt to get a PET scan sooner than 8/1. Gershon Cull gave this NN the number to the SNF pt is currently admitted, [facility], and ask that they be called so transportation to the appt will be available.  NN contacted [facility] at [phone] and asked to be transferred to transportation coordinator, Tammy. No answer at Tammy's number, VM left with this NNs desk phone number requesting a call back to make arrangements for pt's transportation.

## 2022-11-09 DIAGNOSIS — K644 Residual hemorrhoidal skin tags: Secondary | ICD-10-CM | POA: Diagnosis not present

## 2022-11-09 DIAGNOSIS — R531 Weakness: Secondary | ICD-10-CM | POA: Diagnosis not present

## 2022-11-09 DIAGNOSIS — J9621 Acute and chronic respiratory failure with hypoxia: Secondary | ICD-10-CM | POA: Diagnosis not present

## 2022-11-09 DIAGNOSIS — K5900 Constipation, unspecified: Secondary | ICD-10-CM | POA: Diagnosis not present

## 2022-11-10 ENCOUNTER — Telehealth: Payer: Self-pay | Admitting: Internal Medicine

## 2022-11-10 DIAGNOSIS — A499 Bacterial infection, unspecified: Secondary | ICD-10-CM | POA: Diagnosis not present

## 2022-11-10 DIAGNOSIS — J9691 Respiratory failure, unspecified with hypoxia: Secondary | ICD-10-CM | POA: Diagnosis not present

## 2022-11-10 DIAGNOSIS — F339 Major depressive disorder, recurrent, unspecified: Secondary | ICD-10-CM | POA: Diagnosis not present

## 2022-11-10 DIAGNOSIS — E785 Hyperlipidemia, unspecified: Secondary | ICD-10-CM | POA: Diagnosis not present

## 2022-11-10 DIAGNOSIS — J9621 Acute and chronic respiratory failure with hypoxia: Secondary | ICD-10-CM | POA: Diagnosis not present

## 2022-11-10 DIAGNOSIS — M19012 Primary osteoarthritis, left shoulder: Secondary | ICD-10-CM | POA: Diagnosis not present

## 2022-11-10 DIAGNOSIS — R262 Difficulty in walking, not elsewhere classified: Secondary | ICD-10-CM | POA: Diagnosis not present

## 2022-11-10 DIAGNOSIS — E119 Type 2 diabetes mellitus without complications: Secondary | ICD-10-CM | POA: Diagnosis not present

## 2022-11-10 DIAGNOSIS — N390 Urinary tract infection, site not specified: Secondary | ICD-10-CM | POA: Diagnosis not present

## 2022-11-10 DIAGNOSIS — R918 Other nonspecific abnormal finding of lung field: Secondary | ICD-10-CM | POA: Diagnosis not present

## 2022-11-10 DIAGNOSIS — R4589 Other symptoms and signs involving emotional state: Secondary | ICD-10-CM | POA: Diagnosis not present

## 2022-11-10 NOTE — Progress Notes (Signed)
This NN reached out to [facility] at [phone] to arrange transportation for the patient for her upcoming appts. Receptionist connected NN to transportation coordinator. No answer. VM left with desk number and a request to call this NN back.

## 2022-11-10 NOTE — Telephone Encounter (Signed)
Scheduled per 07/23 scheduling message, patient has been called and notified.

## 2022-11-11 DIAGNOSIS — K5900 Constipation, unspecified: Secondary | ICD-10-CM | POA: Diagnosis not present

## 2022-11-11 DIAGNOSIS — R531 Weakness: Secondary | ICD-10-CM | POA: Diagnosis not present

## 2022-11-11 DIAGNOSIS — I10 Essential (primary) hypertension: Secondary | ICD-10-CM | POA: Diagnosis not present

## 2022-11-12 DIAGNOSIS — R112 Nausea with vomiting, unspecified: Secondary | ICD-10-CM | POA: Diagnosis not present

## 2022-11-12 DIAGNOSIS — K5900 Constipation, unspecified: Secondary | ICD-10-CM | POA: Diagnosis not present

## 2022-11-12 DIAGNOSIS — R531 Weakness: Secondary | ICD-10-CM | POA: Diagnosis not present

## 2022-11-12 NOTE — Progress Notes (Signed)
NN was able to reach Tammi, transportation scheduler, at [employer] to arrange for rides for the pt on 8/1 for her PET scan at [facility] at 8:40.  Tammi informed to have pt at [facility] on 8/1 for her PET scan at 8:10am, and to have her at [facility] on 8/5 at 2:45pm. Tammi verbalized understanding.  Pt's daughter, Gershon Cull, reached by phone by NN and informed her that rides have been established for the patient for her appts. Priscilla verbalized understanding. No questions at this time.

## 2022-11-14 DIAGNOSIS — R531 Weakness: Secondary | ICD-10-CM | POA: Diagnosis not present

## 2022-11-14 DIAGNOSIS — D649 Anemia, unspecified: Secondary | ICD-10-CM | POA: Diagnosis not present

## 2022-11-14 DIAGNOSIS — G4733 Obstructive sleep apnea (adult) (pediatric): Secondary | ICD-10-CM | POA: Diagnosis not present

## 2022-11-14 DIAGNOSIS — G894 Chronic pain syndrome: Secondary | ICD-10-CM | POA: Diagnosis not present

## 2022-11-14 DIAGNOSIS — N1831 Chronic kidney disease, stage 3a: Secondary | ICD-10-CM | POA: Diagnosis not present

## 2022-11-14 DIAGNOSIS — J449 Chronic obstructive pulmonary disease, unspecified: Secondary | ICD-10-CM | POA: Diagnosis not present

## 2022-11-16 DIAGNOSIS — Z91199 Patient's noncompliance with other medical treatment and regimen due to unspecified reason: Secondary | ICD-10-CM | POA: Diagnosis not present

## 2022-11-16 DIAGNOSIS — L853 Xerosis cutis: Secondary | ICD-10-CM | POA: Diagnosis not present

## 2022-11-16 DIAGNOSIS — J9621 Acute and chronic respiratory failure with hypoxia: Secondary | ICD-10-CM | POA: Diagnosis not present

## 2022-11-16 DIAGNOSIS — G4733 Obstructive sleep apnea (adult) (pediatric): Secondary | ICD-10-CM | POA: Diagnosis not present

## 2022-11-17 DIAGNOSIS — Z91199 Patient's noncompliance with other medical treatment and regimen due to unspecified reason: Secondary | ICD-10-CM | POA: Diagnosis not present

## 2022-11-17 DIAGNOSIS — R5383 Other fatigue: Secondary | ICD-10-CM | POA: Diagnosis not present

## 2022-11-17 DIAGNOSIS — R918 Other nonspecific abnormal finding of lung field: Secondary | ICD-10-CM | POA: Diagnosis not present

## 2022-11-18 ENCOUNTER — Encounter (HOSPITAL_COMMUNITY)
Admission: RE | Admit: 2022-11-18 | Discharge: 2022-11-18 | Disposition: A | Discharge status: Home / Self Care | Payer: Medicare PPO | Source: Ambulatory Visit | Attending: Internal Medicine | Admitting: Internal Medicine

## 2022-11-18 DIAGNOSIS — R911 Solitary pulmonary nodule: Secondary | ICD-10-CM | POA: Insufficient documentation

## 2022-11-18 LAB — GLUCOSE, CAPILLARY: Glucose-Capillary: 113 mg/dL — ABNORMAL HIGH (ref 70–99)

## 2022-11-18 MED ORDER — FLUDEOXYGLUCOSE F - 18 (FDG) INJECTION
12.0000 | Freq: Once | INTRAVENOUS | Status: AC
  Administered 2022-11-18: 11.99 via INTRAVENOUS

## 2022-11-22 ENCOUNTER — Telehealth: Payer: Self-pay | Admitting: Internal Medicine

## 2022-11-22 ENCOUNTER — Inpatient Hospital Stay: Payer: Medicare PPO

## 2022-11-22 ENCOUNTER — Other Ambulatory Visit: Payer: Self-pay | Admitting: *Deleted

## 2022-11-22 ENCOUNTER — Inpatient Hospital Stay: Payer: Medicare PPO | Admitting: Internal Medicine

## 2022-11-22 DIAGNOSIS — R911 Solitary pulmonary nodule: Secondary | ICD-10-CM

## 2022-11-22 NOTE — Telephone Encounter (Signed)
They have stated that the patient is currently sick; patient's daughter is aware of rescheduled appointment times/dates

## 2022-12-04 ENCOUNTER — Emergency Department (HOSPITAL_COMMUNITY)
Admission: EM | Admit: 2022-12-04 | Discharge: 2022-12-04 | Disposition: A | Discharge status: Home / Self Care | Payer: Medicare PPO | Attending: Emergency Medicine | Admitting: Emergency Medicine

## 2022-12-04 ENCOUNTER — Encounter (HOSPITAL_COMMUNITY): Payer: Self-pay

## 2022-12-04 ENCOUNTER — Emergency Department (HOSPITAL_COMMUNITY): Payer: Medicare PPO

## 2022-12-04 ENCOUNTER — Other Ambulatory Visit: Payer: Self-pay

## 2022-12-04 DIAGNOSIS — R1084 Generalized abdominal pain: Secondary | ICD-10-CM | POA: Insufficient documentation

## 2022-12-04 DIAGNOSIS — Z7951 Long term (current) use of inhaled steroids: Secondary | ICD-10-CM | POA: Insufficient documentation

## 2022-12-04 DIAGNOSIS — Z7982 Long term (current) use of aspirin: Secondary | ICD-10-CM | POA: Diagnosis not present

## 2022-12-04 DIAGNOSIS — I110 Hypertensive heart disease with heart failure: Secondary | ICD-10-CM | POA: Diagnosis not present

## 2022-12-04 DIAGNOSIS — R1110 Vomiting, unspecified: Secondary | ICD-10-CM | POA: Insufficient documentation

## 2022-12-04 DIAGNOSIS — E119 Type 2 diabetes mellitus without complications: Secondary | ICD-10-CM | POA: Insufficient documentation

## 2022-12-04 DIAGNOSIS — I509 Heart failure, unspecified: Secondary | ICD-10-CM | POA: Insufficient documentation

## 2022-12-04 DIAGNOSIS — Z79899 Other long term (current) drug therapy: Secondary | ICD-10-CM | POA: Diagnosis not present

## 2022-12-04 DIAGNOSIS — M545 Low back pain, unspecified: Secondary | ICD-10-CM | POA: Insufficient documentation

## 2022-12-04 DIAGNOSIS — R531 Weakness: Secondary | ICD-10-CM | POA: Insufficient documentation

## 2022-12-04 LAB — CBC WITH DIFFERENTIAL/PLATELET
Abs Immature Granulocytes: 0.02 10*3/uL (ref 0.00–0.07)
Basophils Absolute: 0 10*3/uL (ref 0.0–0.1)
Basophils Relative: 1 %
Eosinophils Absolute: 0.1 10*3/uL (ref 0.0–0.5)
Eosinophils Relative: 1 %
HCT: 24.4 % — ABNORMAL LOW (ref 36.0–46.0)
Hemoglobin: 7.5 g/dL — ABNORMAL LOW (ref 12.0–15.0)
Immature Granulocytes: 0 %
Lymphocytes Relative: 26 %
Lymphs Abs: 1.6 10*3/uL (ref 0.7–4.0)
MCH: 29 pg (ref 26.0–34.0)
MCHC: 30.7 g/dL (ref 30.0–36.0)
MCV: 94.2 fL (ref 80.0–100.0)
Monocytes Absolute: 0.5 10*3/uL (ref 0.1–1.0)
Monocytes Relative: 8 %
Neutro Abs: 4 10*3/uL (ref 1.7–7.7)
Neutrophils Relative %: 64 %
Platelets: 273 10*3/uL (ref 150–400)
RBC: 2.59 MIL/uL — ABNORMAL LOW (ref 3.87–5.11)
RDW: 13.7 % (ref 11.5–15.5)
WBC: 6.2 10*3/uL (ref 4.0–10.5)
nRBC: 0 % (ref 0.0–0.2)

## 2022-12-04 LAB — COMPREHENSIVE METABOLIC PANEL
ALT: 20 U/L (ref 0–44)
AST: 17 U/L (ref 15–41)
Albumin: 2.8 g/dL — ABNORMAL LOW (ref 3.5–5.0)
Alkaline Phosphatase: 76 U/L (ref 38–126)
Anion gap: 10 (ref 5–15)
BUN: 11 mg/dL (ref 8–23)
CO2: 28 mmol/L (ref 22–32)
Calcium: 8.8 mg/dL — ABNORMAL LOW (ref 8.9–10.3)
Chloride: 101 mmol/L (ref 98–111)
Creatinine, Ser: 0.76 mg/dL (ref 0.44–1.00)
GFR, Estimated: >60 mL/min (ref >60)
Glucose, Bld: 144 mg/dL — ABNORMAL HIGH (ref 70–99)
Potassium: 3.8 mmol/L (ref 3.5–5.1)
Sodium: 139 mmol/L (ref 135–145)
Total Bilirubin: 0.4 mg/dL (ref 0.3–1.2)
Total Protein: 6.3 g/dL — ABNORMAL LOW (ref 6.5–8.1)

## 2022-12-04 LAB — I-STAT CHEM 8, ED
BUN: 12 mg/dL (ref 8–23)
Calcium, Ion: 1.21 mmol/L (ref 1.15–1.40)
Chloride: 101 mmol/L (ref 98–111)
Creatinine, Ser: 0.8 mg/dL (ref 0.44–1.00)
Glucose, Bld: 146 mg/dL — ABNORMAL HIGH (ref 70–99)
HCT: 24 % — ABNORMAL LOW (ref 36.0–46.0)
Hemoglobin: 8.2 g/dL — ABNORMAL LOW (ref 12.0–15.0)
Potassium: 3.8 mmol/L (ref 3.5–5.1)
Sodium: 139 mmol/L (ref 135–145)
TCO2: 29 mmol/L (ref 22–32)

## 2022-12-04 LAB — LIPASE, BLOOD: Lipase: 26 U/L (ref 11–51)

## 2022-12-04 MED ORDER — IOHEXOL 350 MG/ML SOLN
75.0000 mL | Freq: Once | INTRAVENOUS | Status: AC | PRN
  Administered 2022-12-04: 75 mL via INTRAVENOUS

## 2022-12-04 MED ORDER — ONDANSETRON HCL 4 MG/2ML IJ SOLN
4.0000 mg | Freq: Once | INTRAMUSCULAR | Status: AC
  Administered 2022-12-04: 4 mg via INTRAVENOUS
  Filled 2022-12-04: qty 2

## 2022-12-04 MED ORDER — OXYCODONE-ACETAMINOPHEN 5-325 MG PO TABS
1.0000 | ORAL_TABLET | Freq: Once | ORAL | Status: AC
  Administered 2022-12-04: 1 via ORAL
  Filled 2022-12-04: qty 1

## 2022-12-04 MED ORDER — GABAPENTIN 300 MG PO CAPS
300.0000 mg | ORAL_CAPSULE | Freq: Once | ORAL | Status: AC
  Administered 2022-12-04: 300 mg via ORAL
  Filled 2022-12-04: qty 1

## 2022-12-04 MED ORDER — FENTANYL CITRATE PF 50 MCG/ML IJ SOSY
50.0000 ug | PREFILLED_SYRINGE | Freq: Once | INTRAMUSCULAR | Status: AC
  Administered 2022-12-04: 50 ug via INTRAVENOUS
  Filled 2022-12-04: qty 1

## 2022-12-04 MED ORDER — SODIUM CHLORIDE 0.9 % IV BOLUS
500.0000 mL | Freq: Once | INTRAVENOUS | Status: AC
  Administered 2022-12-04: 500 mL via INTRAVENOUS

## 2022-12-04 NOTE — ED Provider Notes (Signed)
[place] EMERGENCY DEPARTMENT AT [facility] Provider Note   CSN: [id number] Arrival date & time: 12/04/22  1110    History  Chief Complaint  Patient presents with   Abdominal Pain   Emesis    [name] is a 82 y.o. female history of CHF, hypertension, chronic back pain, diabetes, epilepsy here for evaluation abdominal pain and back pain.  States her back pain has been ongoing over the last 2 weeks.  Radiates down her left leg.  She has had left leg pain for months which has been chronic.  Had prior angiogram with Dr. Edilia Bo however pain does not feel similar to when she needed an angiogram.  States she has had abdominal pain which began yesterday.  Multiple episodes of NBNB emesis.  Her abdominal pain is diffuse in nature.  She was recently discharged from nursing facility last week.  Discharged to family.  Currently lives with son, daughter comes in to help take care of her as well.  Her last bowel movement was last week.[name]  Apparently needed enema for that.  She has chronic hypoxic respiratory failure on 2 L at baseline.  She denies any cough, shortness of breath, chest pain, numbness.  She feels generally weak.  No dysuria hematuria.  States she is urinating without any difficulties.  States she has heaviness to her bilateral lower extremities at baseline for months which is unchanged.  HPI     Home Medications Prior to Admission medications   Medication Sig Start Date End Date Taking? Authorizing Provider  acetaminophen (TYLENOL) 325 MG tablet Take 2 tablets (650 mg total) by mouth every 4 (four) hours as needed for mild pain or moderate pain (or Fever >/= 101). 11/05/22   Lewie Chamber, MD  albuterol (PROVENTIL HFA) 108 (90 Base) MCG/ACT inhaler Inhale 2 puffs into the lungs every 4 (four) hours as needed for wheezing or shortness of breath. 06/12/17   Shon Hale, MD  amLODipine (NORVASC) 5 MG tablet Take 5 mg by mouth daily.    [provider]   aspirin EC 81 MG tablet Take 81 mg by mouth daily.    [provider]  atorvastatin (LIPITOR) 40 MG tablet Take 1 tablet (40 mg total) by mouth daily. 04/24/19 10/28/22  Garnette Gunner, MD  clopidogrel (PLAVIX) 75 MG tablet Take 1 tablet (75 mg total) by mouth daily. 05/14/22   Chuck Hint, MD  furosemide (LASIX) 20 MG tablet Take 40 mg by mouth daily.    [provider]  gabapentin (NEURONTIN) 300 MG capsule TAKE 1 CAPSULE(300 MG) BY MOUTH TWICE DAILY Patient taking differently: Take 300 mg by mouth 2 (two) times daily. 05/09/18   Romero Belling, MD  glucose blood [facility] VERIO) test strip 1 each by Other route 2 (two) times daily. And lancets 2/day 07/20/17   Romero Belling, MD  hydrALAZINE (APRESOLINE) 25 MG tablet Take 1 tablet (25 mg total) by mouth every 8 (eight) hours. 11/05/22   Lewie Chamber, MD  Hyprom-Naphaz-Polysorb-Zn Sulf (CLEAR EYES COMPLETE OP) Place 2 drops into both eyes daily.    [provider]  insulin aspart (NOVOLOG) 100 UNIT/ML injection Inject 0-15 Units into the skin 3 (three) times daily with meals. 11/05/22   Lewie Chamber, MD  ipratropium-albuterol (DUONEB) 0.5-2.5 (3) MG/3ML SOLN Take 3 mLs by nebulization every 6 (six) hours as needed. Patient taking differently: Take 3 mLs by nebulization every 6 (six) hours as needed (Shortness of breath). 06/12/17   Emokpae, Courage,  MD  LANTUS SOLOSTAR 100 UNIT/ML Solostar Pen Inject 10 Units into the skin daily. 11/05/22   Lewie Chamber, MD  levETIRAcetam (KEPPRA) 500 MG tablet Take 1 tablet (500 mg total) by mouth 2 (two) times daily. 04/24/19 10/28/22  Garnette Gunner, MD  metoprolol tartrate (LOPRESSOR) 25 MG tablet Take 0.5 tablets (12.5 mg total) by mouth 2 (two) times daily. 11/05/22   Lewie Chamber, MD  Multiple Vitamin (MULTIVITAMIN WITH MINERALS) TABS tablet Take 1 tablet by mouth daily. 11/05/22   Lewie Chamber, MD  nicotine (NICODERM CQ - DOSED IN MG/24 HOURS) 14 mg/24hr patch Place 1  patch (14 mg total) onto the skin daily. 11/05/22   Lewie Chamber, MD  potassium chloride SA (KLOR-CON M) 20 MEQ tablet Take 1 tablet (20 mEq total) by mouth daily. 11/05/22   Lewie Chamber, MD  umeclidinium-vilanterol (ANORO ELLIPTA) 62.5-25 MCG/ACT AEPB Inhale 1 puff into the lungs daily. 11/06/22   Lewie Chamber, MD  promethazine (PHENERGAN) 25 MG tablet Take 1 tablet (25 mg total) by mouth every 6 (six) hours as needed for nausea. 06/25/15 06/25/15  Derwood Kaplan, MD      Allergies    Hydrocodone, Lisinopril, Pioglitazone, and Varenicline tartrate    Review of Systems   Review of Systems  Constitutional: Negative.   HENT: Negative.    Respiratory: Negative.    Cardiovascular: Negative.   Gastrointestinal:  Positive for abdominal pain, constipation, nausea and vomiting. Negative for abdominal distention, anal bleeding, blood in stool, diarrhea and rectal pain.  Genitourinary: Negative.   Musculoskeletal:  Positive for back pain (chronic).  Skin: Negative.   Neurological:  Positive for weakness (generalized). Negative for dizziness, tremors, seizures, syncope, speech difficulty, light-headedness, numbness and headaches.  All other systems reviewed and are negative.   Physical Exam Updated Vital Signs BP (!) 153/61   Pulse 75   Temp 98 F (36.7 C) (Oral)   Resp 15   SpO2 100%  Physical Exam Vitals and nursing note reviewed.  Constitutional:      General: She is not in acute distress.    Appearance: She is well-developed. She is ill-appearing (chronically ill appearing). She is not toxic-appearing or diaphoretic.  HENT:     Head: Atraumatic.  Eyes:     Pupils: Pupils are equal, round, and reactive to light.  Cardiovascular:     Rate and Rhythm: Normal rate.     Pulses:          Dorsalis pedis pulses are detected w/ Doppler on the right side and detected w/ Doppler on the left side.     Heart sounds: Normal heart sounds.  Pulmonary:     Effort: Pulmonary effort is normal.  No respiratory distress.     Breath sounds: Normal breath sounds and air entry.     Comments: 2 L [place] Abdominal:     General: There is no distension.     Palpations: Abdomen is soft.     Tenderness: There is generalized abdominal tenderness.     Comments: Generalized tenderness, no rebound or guarding.  Musculoskeletal:        General: Normal range of motion.     Cervical back: Normal range of motion.     Comments: Generalized midline lumbar tenderness, lift bilateral legs off bed however cannot perform straight leg raise to barely off bed secondary to patient pain bilaterally.  Feet:     Comments: Thickened skin and toenails bilaterally, Doppler DP pulses bilaterally Skin:    General: Skin is  warm and dry.     Comments: No obvious erythema, warmth to suggest cellulitis or abscess  Neurological:     General: No focal deficit present.     Mental Status: She is alert.     Cranial Nerves: Cranial nerves 2-12 are intact.     Sensory: Sensation is intact.     Comments: Intact sensation, equal handgrip bilaterally Cranial nerves intact  Psychiatric:        Mood and Affect: Mood normal.     ED Results / Procedures / Treatments   Labs (all labs ordered are listed, but only abnormal results are displayed) Labs Reviewed  CBC WITH DIFFERENTIAL/PLATELET - Abnormal; Notable for the following components:      Result Value   RBC 2.59 (*)    Hemoglobin 7.5 (*)    HCT 24.4 (*)    All other components within normal limits  I-STAT CHEM 8, ED - Abnormal; Notable for the following components:   Glucose, Bld 146 (*)    Hemoglobin 8.2 (*)    HCT 24.0 (*)    All other components within normal limits  COMPREHENSIVE METABOLIC PANEL  LIPASE, BLOOD  URINALYSIS, ROUTINE W REFLEX MICROSCOPIC    EKG None  Radiology No results found.  Procedures Procedures    Medications Ordered in ED Medications  sodium chloride 0.9 % bolus 500 mL (500 mLs Intravenous New Bag/Given 12/04/22 1329)   fentaNYL (SUBLIMAZE) injection 50 mcg (50 mcg Intravenous Given 12/04/22 1422)  ondansetron (ZOFRAN) injection 4 mg (4 mg Intravenous Given 12/04/22 1422)    ED Course/ Medical Decision Making/ A&P Clinical Course as of 12/04/22 1530  Sat Dec 04, 2022  1509 Chronically ill, recent admission for AMS. Here w acute on chronic back and abd pain, n/v. Hx of claudication. Getting dissection study. On 2L O2 at baseline. Have pharm tech to review meds with her.  [WL]    Clinical Course User Index [WL] Dyanne Iha, MD   82 year old here for evaluation abdominal pain and back pain.  She is chronic lower extremity pain which is at baseline per patient.  Recently got discharged from SNF back to family after last hospitalization for altered mental status.  Patient with diffuse tenderness to abdomen on exam, multiple episodes of NBNB emesis.  She is also having some back pain, chronic in nature however worsening per patient.  Goes down her left leg.  Of note she did have an angiogram with Dr. Edilia Bo in January, patient states this feels different then her pain at that time.  She has Doppler pulses bilaterally.  Warm lower extremities, peers to be perfused.  She is unsure if she has a history of AAA, dissection.  Will plan on labs, imaging, reassess  Labs and imaging personally viewed and interpreted:  CBC no leukocytosis, does show anemia   Care transferred to [facility] provider who will follow-up on remaining labs and imaging. Dispo per oncoming MD.                               Medical Decision Making Amount and/or Complexity of Data Reviewed External Data Reviewed: labs, radiology, ECG and notes. Labs: ordered. Decision-making details documented in ED Course. Radiology: ordered and independent interpretation performed. Decision-making details documented in ED Course.  Risk OTC drugs. Prescription drug management. Decision regarding hospitalization. Diagnosis or treatment significantly  limited by social determinants of health.  Final Clinical Impression(s) / ED Diagnoses Final diagnoses:  Generalized abdominal pain    Rx / DC Orders ED Discharge Orders     None         Dorse Locy A, PA-C 12/04/22 1530    Arby Barrette, MD 12/08/22 [phone]

## 2022-12-04 NOTE — ED Notes (Signed)
Daughter stated pt isn't doing well at home at all. Daughter states she was told she needs around the clock care and would like her back at [facility].

## 2022-12-04 NOTE — ED Provider Notes (Signed)
  Physical Exam  BP 134/68   Pulse 66   Temp 98 F (36.7 C) (Oral)   Resp 14   SpO2 100%   Physical Exam Vitals and nursing note reviewed.  Constitutional:      General: She is not in acute distress.    Appearance: She is well-developed. She is obese.  HENT:     Head: Normocephalic and atraumatic.  Eyes:     Conjunctiva/sclera: Conjunctivae normal.  Cardiovascular:     Rate and Rhythm: Normal rate and regular rhythm.     Heart sounds: No murmur heard. Pulmonary:     Effort: Pulmonary effort is normal. No respiratory distress.     Breath sounds: Normal breath sounds.  Abdominal:     Palpations: Abdomen is soft.     Tenderness: There is no abdominal tenderness.  Musculoskeletal:        General: No swelling.     Cervical back: Neck supple.  Skin:    General: Skin is warm and dry.     Capillary Refill: Capillary refill takes less than 2 seconds.  Neurological:     Mental Status: She is alert.  Psychiatric:        Mood and Affect: Mood normal.     Procedures  Procedures  ED Course / MDM   Clinical Course as of 12/04/22 2211  Sat Dec 04, 2022  1509 Chronically ill, recent admission for AMS. Here w acute on chronic back and abd pain, n/v. Hx of claudication. Getting dissection study. On 2L O2 at baseline. Have pharm tech to review meds with her.  [WL]  1615 Reassess [CC]    Clinical Course User Index [CC] Glyn Ade, MD [WL] Dyanne Iha, MD   Medical Decision Making Amount and/or Complexity of Data Reviewed Labs: ordered. Radiology: ordered.  Risk Prescription drug management.   Upon reassessment, patient complaining of pain in her left hip.  Claims this has been ongoing for weeks and that she takes pain medication at home.  Oxycodone and gabapentin administered for pain control.  Vital signs remained stable.  CT resulted with no acute findings but with incidentally found pulmonary nodule that has been previously demonstrated on prior imaging.   Patient was informed of this result and instructed to follow-up with primary care provider regarding this finding.  She voiced agreement and understanding with this plan.  Upon reassessment, patient much more comfortable now, claiming it pain has gone away.  Pharmacy technician reviewed medications with patient, instructing her how to take medications appropriately.  At this time, patient has been deemed safe and stable for discharge.  Strict return precautions voiced.  Instructed follow-up with primary care provider.       Dyanne Iha, MD 12/04/22 1610    Glyn Ade, MD 12/04/22 2329

## 2022-12-04 NOTE — ED Notes (Signed)
The pt reports that she has called her daughter and she's ready to go gome  however there are no discharge papers yet

## 2022-12-04 NOTE — ED Triage Notes (Signed)
Pt [facility] coming from home. Pt released from nursing home last week. Pt w/ c/o vomiting that started last night. Abdomen soft, non-tender, painful. Abdominal pain began last night. Hx of gerd, hx of "many GI problems." Emesis is yellow. Pt has not had BM since last weekend. Pt reports c/o chronic pain in left thigh and right shoulder. A&Ox4.   EMS reported VS:  192/98 99 on 2L [place] 78 178 cbg 18 RR

## 2022-12-04 NOTE — ED Notes (Signed)
Patient placed onto bedpan

## 2022-12-04 NOTE — ED Notes (Signed)
The pt had voided in the bed a large amount of urine bed and pt cleaned she reports that she was told she had a dixcharge to home  papers not ready yet and we do not know about how she is leaving no family has shown up and the pt reports that she cannot move very much

## 2022-12-04 NOTE — Discharge Instructions (Addendum)
You were seen today for abdominal pain. While you were here we monitored your vitals, preformed a physical exam, and checked blood work and imaging. These were all reassuring and there is no indication for any further testing or intervention in the emergency department at this time.   Things to do:  - Follow up with your primary care provider within the next 1-2 weeks -Be sure to mention the lung nodule that we saw on CT today.  Is been demonstrated on prior studies, but seems to be unchanged and is still present.  Be sure to mention this finding to your primary care doctor.  Return to the emergency department if you have any new or worsening symptoms including severe abdominal pain, back pain, fevers, chest pain, shortness of breath, or if you have any other concerns.

## 2022-12-06 ENCOUNTER — Inpatient Hospital Stay: Payer: Medicare PPO | Admitting: Internal Medicine

## 2022-12-06 ENCOUNTER — Inpatient Hospital Stay: Payer: Medicare PPO | Attending: Internal Medicine

## 2022-12-06 VITALS — BP 111/58 | HR 52 | Temp 97.5°F | Resp 17 | Ht 66.0 in | Wt 286.0 lb

## 2022-12-06 DIAGNOSIS — R911 Solitary pulmonary nodule: Secondary | ICD-10-CM | POA: Insufficient documentation

## 2022-12-06 DIAGNOSIS — R5383 Other fatigue: Secondary | ICD-10-CM | POA: Diagnosis not present

## 2022-12-06 DIAGNOSIS — C3490 Malignant neoplasm of unspecified part of unspecified bronchus or lung: Secondary | ICD-10-CM | POA: Diagnosis not present

## 2022-12-06 DIAGNOSIS — R531 Weakness: Secondary | ICD-10-CM | POA: Insufficient documentation

## 2022-12-06 LAB — CBC WITH DIFFERENTIAL (CANCER CENTER ONLY)
Abs Immature Granulocytes: 0.01 10*3/uL (ref 0.00–0.07)
Basophils Absolute: 0.1 10*3/uL (ref 0.0–0.1)
Basophils Relative: 1 %
Eosinophils Absolute: 0.2 10*3/uL (ref 0.0–0.5)
Eosinophils Relative: 3 %
HCT: 26.2 % — ABNORMAL LOW (ref 36.0–46.0)
Hemoglobin: 8.1 g/dL — ABNORMAL LOW (ref 12.0–15.0)
Immature Granulocytes: 0 %
Lymphocytes Relative: 20 %
Lymphs Abs: 1.3 10*3/uL (ref 0.7–4.0)
MCH: 29.7 pg (ref 26.0–34.0)
MCHC: 30.9 g/dL (ref 30.0–36.0)
MCV: 96 fL (ref 80.0–100.0)
Monocytes Absolute: 0.5 10*3/uL (ref 0.1–1.0)
Monocytes Relative: 7 %
Neutro Abs: 4.5 10*3/uL (ref 1.7–7.7)
Neutrophils Relative %: 69 %
Platelet Count: 281 10*3/uL (ref 150–400)
RBC: 2.73 MIL/uL — ABNORMAL LOW (ref 3.87–5.11)
RDW: 13.7 % (ref 11.5–15.5)
WBC Count: 6.5 10*3/uL (ref 4.0–10.5)
nRBC: 0 % (ref 0.0–0.2)

## 2022-12-06 LAB — CMP (CANCER CENTER ONLY)
ALT: 12 U/L (ref 0–44)
AST: 12 U/L — ABNORMAL LOW (ref 15–41)
Albumin: 3.3 g/dL — ABNORMAL LOW (ref 3.5–5.0)
Alkaline Phosphatase: 91 U/L (ref 38–126)
Anion gap: 5 (ref 5–15)
BUN: 16 mg/dL (ref 8–23)
CO2: 32 mmol/L (ref 22–32)
Calcium: 8.8 mg/dL — ABNORMAL LOW (ref 8.9–10.3)
Chloride: 102 mmol/L (ref 98–111)
Creatinine: 1.02 mg/dL — ABNORMAL HIGH (ref 0.44–1.00)
GFR, Estimated: 55 mL/min — ABNORMAL LOW (ref >60)
Glucose, Bld: 178 mg/dL — ABNORMAL HIGH (ref 70–99)
Potassium: 3.9 mmol/L (ref 3.5–5.1)
Sodium: 139 mmol/L (ref 135–145)
Total Bilirubin: 0.3 mg/dL (ref 0.3–1.2)
Total Protein: 6.8 g/dL (ref 6.5–8.1)
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
## 2022-12-09 NOTE — Addendum Note (Signed)
Addended by: Charma Igo on: 12/09/2022 01:20 PM   Modules accepted: Orders

## 2022-12-13 DIAGNOSIS — C3411 Malignant neoplasm of upper lobe, right bronchus or lung: Secondary | ICD-10-CM | POA: Insufficient documentation

## 2022-12-13 NOTE — Progress Notes (Incomplete)
Thoracic Location of Tumor / Histology: RUL Lung  Patient was found to have a RUL lung nodule during recent ER visit.  CTA CAP 12/04/2022: RUL nodule measuring 2.4 cm, and no evidence of embolism or dissection.    PET 11/18/2022: Macro-lobulated RUL nodule measuring 2.3 cm.  No evidence of metastatic disease was noted.  MRI Brain 10/28/2022: Evaluation is limited by motion.  Within this limitation, no acute intracranial process.   Biopsies of    Past/Anticipated interventions by cardiothoracic surgery, if any:   Past/Anticipated interventions by medical oncology, if any:  Dr. Arbutus Ped 12/06/2022 -Patient presented with right upper lobe lung nodule with no other evidence of metastatic disease to the thoracic lymph nodes or extrathoracic metastasis.  -The patient is very weak and not a good surgical candidate for resection.  -I recommended for her to see pulmonary medicine for consideration of bronchoscopy and biopsy of the right upper lobe lung nodule followed by SBRT by radiation oncology.    Tobacco/Marijuana/Snuff/ETOH use: {:18581}  Signs/Symptoms Weight changes, if any: {:18581} Respiratory complaints, if any: {:18581} Hemoptysis, if any: {:18581} Pain issues, if any:  {:18581}  SAFETY ISSUES: Prior radiation? {:18581} Pacemaker/ICD? {:18581}  Possible current pregnancy? Postmenopausal Is the patient on methotrexate? {:18581}  Current Complaints / other details:  ***

## 2022-12-14 ENCOUNTER — Telehealth: Payer: Self-pay

## 2022-12-14 ENCOUNTER — Ambulatory Visit: Payer: Medicare PPO

## 2022-12-14 ENCOUNTER — Ambulatory Visit: Payer: Medicare PPO | Admitting: Radiation Oncology

## 2022-12-14 DIAGNOSIS — C3411 Malignant neoplasm of upper lobe, right bronchus or lung: Secondary | ICD-10-CM

## 2022-12-14 NOTE — Progress Notes (Signed)
This NN reached out to the pt's daughter, Oda Cogan, in regard to the pt's missed rad onc appt. Per Pricillia, pt does not want radiation and has decided to go on hospice. This NN reminded Pricillia that the pts condition is curable with radiation alone, Pricilla verbalized understanding, however is adamant that the pt doesn't want treatment and is currently on hospice. This NN asked which hospice service the pt is using, however Pricilla was unsure but that "it starts with an A". This NN asked if she could speak to the pt, however the pt was not there will [facility], however this NN provided desk number to [place] and ask that the pt call either this NN or radiation oncology for further discussion.

## 2022-12-24 ENCOUNTER — Emergency Department (HOSPITAL_COMMUNITY): Payer: Medicare Other

## 2022-12-24 ENCOUNTER — Encounter (HOSPITAL_COMMUNITY): Payer: Self-pay

## 2022-12-24 ENCOUNTER — Inpatient Hospital Stay (HOSPITAL_COMMUNITY)
Admission: EM | Admit: 2022-12-24 | Discharge: 2022-12-30 | DRG: 392 | Disposition: A | Discharge status: Hospice | Payer: Medicare Other | Attending: Internal Medicine | Admitting: Internal Medicine

## 2022-12-24 ENCOUNTER — Other Ambulatory Visit: Payer: Self-pay

## 2022-12-24 DIAGNOSIS — G40909 Epilepsy, unspecified, not intractable, without status epilepticus: Secondary | ICD-10-CM | POA: Diagnosis present

## 2022-12-24 DIAGNOSIS — Z9862 Peripheral vascular angioplasty status: Secondary | ICD-10-CM

## 2022-12-24 DIAGNOSIS — Z8041 Family history of malignant neoplasm of ovary: Secondary | ICD-10-CM

## 2022-12-24 DIAGNOSIS — M810 Age-related osteoporosis without current pathological fracture: Secondary | ICD-10-CM | POA: Diagnosis present

## 2022-12-24 DIAGNOSIS — I5032 Chronic diastolic (congestive) heart failure: Secondary | ICD-10-CM | POA: Diagnosis present

## 2022-12-24 DIAGNOSIS — J449 Chronic obstructive pulmonary disease, unspecified: Secondary | ICD-10-CM | POA: Diagnosis present

## 2022-12-24 DIAGNOSIS — Z7982 Long term (current) use of aspirin: Secondary | ICD-10-CM

## 2022-12-24 DIAGNOSIS — Z885 Allergy status to narcotic agent status: Secondary | ICD-10-CM

## 2022-12-24 DIAGNOSIS — G894 Chronic pain syndrome: Secondary | ICD-10-CM | POA: Diagnosis present

## 2022-12-24 DIAGNOSIS — L89159 Pressure ulcer of sacral region, unspecified stage: Secondary | ICD-10-CM | POA: Diagnosis present

## 2022-12-24 DIAGNOSIS — G4733 Obstructive sleep apnea (adult) (pediatric): Secondary | ICD-10-CM | POA: Diagnosis present

## 2022-12-24 DIAGNOSIS — K5289 Other specified noninfective gastroenteritis and colitis: Secondary | ICD-10-CM | POA: Diagnosis not present

## 2022-12-24 DIAGNOSIS — C3490 Malignant neoplasm of unspecified part of unspecified bronchus or lung: Secondary | ICD-10-CM | POA: Diagnosis present

## 2022-12-24 DIAGNOSIS — Z90710 Acquired absence of both cervix and uterus: Secondary | ICD-10-CM

## 2022-12-24 DIAGNOSIS — Z515 Encounter for palliative care: Secondary | ICD-10-CM

## 2022-12-24 DIAGNOSIS — D649 Anemia, unspecified: Secondary | ICD-10-CM

## 2022-12-24 DIAGNOSIS — L89620 Pressure ulcer of left heel, unstageable: Secondary | ICD-10-CM | POA: Diagnosis present

## 2022-12-24 DIAGNOSIS — K3184 Gastroparesis: Secondary | ICD-10-CM | POA: Diagnosis present

## 2022-12-24 DIAGNOSIS — I2510 Atherosclerotic heart disease of native coronary artery without angina pectoris: Secondary | ICD-10-CM | POA: Diagnosis present

## 2022-12-24 DIAGNOSIS — I110 Hypertensive heart disease with heart failure: Secondary | ICD-10-CM | POA: Diagnosis present

## 2022-12-24 DIAGNOSIS — Z961 Presence of intraocular lens: Secondary | ICD-10-CM | POA: Diagnosis present

## 2022-12-24 DIAGNOSIS — F17210 Nicotine dependence, cigarettes, uncomplicated: Secondary | ICD-10-CM | POA: Diagnosis present

## 2022-12-24 DIAGNOSIS — Z79899 Other long term (current) drug therapy: Secondary | ICD-10-CM

## 2022-12-24 DIAGNOSIS — Z9841 Cataract extraction status, right eye: Secondary | ICD-10-CM

## 2022-12-24 DIAGNOSIS — Z86010 Personal history of colonic polyps: Secondary | ICD-10-CM

## 2022-12-24 DIAGNOSIS — Z66 Do not resuscitate: Secondary | ICD-10-CM | POA: Diagnosis present

## 2022-12-24 DIAGNOSIS — E1143 Type 2 diabetes mellitus with diabetic autonomic (poly)neuropathy: Secondary | ICD-10-CM | POA: Diagnosis present

## 2022-12-24 DIAGNOSIS — K921 Melena: Secondary | ICD-10-CM

## 2022-12-24 DIAGNOSIS — Z6841 Body Mass Index (BMI) 40.0 and over, adult: Secondary | ICD-10-CM

## 2022-12-24 DIAGNOSIS — K219 Gastro-esophageal reflux disease without esophagitis: Secondary | ICD-10-CM | POA: Diagnosis present

## 2022-12-24 DIAGNOSIS — D509 Iron deficiency anemia, unspecified: Secondary | ICD-10-CM | POA: Diagnosis present

## 2022-12-24 DIAGNOSIS — K7460 Unspecified cirrhosis of liver: Secondary | ICD-10-CM | POA: Diagnosis present

## 2022-12-24 DIAGNOSIS — E785 Hyperlipidemia, unspecified: Secondary | ICD-10-CM | POA: Diagnosis present

## 2022-12-24 DIAGNOSIS — Z9981 Dependence on supplemental oxygen: Secondary | ICD-10-CM

## 2022-12-24 DIAGNOSIS — Z7902 Long term (current) use of antithrombotics/antiplatelets: Secondary | ICD-10-CM

## 2022-12-24 DIAGNOSIS — I69351 Hemiplegia and hemiparesis following cerebral infarction affecting right dominant side: Secondary | ICD-10-CM

## 2022-12-24 DIAGNOSIS — K5903 Drug induced constipation: Secondary | ICD-10-CM | POA: Diagnosis present

## 2022-12-24 DIAGNOSIS — Z833 Family history of diabetes mellitus: Secondary | ICD-10-CM

## 2022-12-24 DIAGNOSIS — R339 Retention of urine, unspecified: Secondary | ICD-10-CM | POA: Diagnosis not present

## 2022-12-24 DIAGNOSIS — E1151 Type 2 diabetes mellitus with diabetic peripheral angiopathy without gangrene: Secondary | ICD-10-CM | POA: Diagnosis present

## 2022-12-24 DIAGNOSIS — Z888 Allergy status to other drugs, medicaments and biological substances status: Secondary | ICD-10-CM

## 2022-12-24 DIAGNOSIS — Z800 Family history of malignant neoplasm of digestive organs: Secondary | ICD-10-CM

## 2022-12-24 DIAGNOSIS — Z993 Dependence on wheelchair: Secondary | ICD-10-CM

## 2022-12-24 DIAGNOSIS — Z9842 Cataract extraction status, left eye: Secondary | ICD-10-CM

## 2022-12-24 DIAGNOSIS — D62 Acute posthemorrhagic anemia: Secondary | ICD-10-CM | POA: Diagnosis not present

## 2022-12-24 DIAGNOSIS — I252 Old myocardial infarction: Secondary | ICD-10-CM

## 2022-12-24 DIAGNOSIS — Z832 Family history of diseases of the blood and blood-forming organs and certain disorders involving the immune mechanism: Secondary | ICD-10-CM

## 2022-12-24 DIAGNOSIS — Z794 Long term (current) use of insulin: Secondary | ICD-10-CM

## 2022-12-24 DIAGNOSIS — J9611 Chronic respiratory failure with hypoxia: Secondary | ICD-10-CM | POA: Diagnosis present

## 2022-12-24 DIAGNOSIS — L89610 Pressure ulcer of right heel, unstageable: Secondary | ICD-10-CM | POA: Diagnosis present

## 2022-12-24 DIAGNOSIS — T402X5A Adverse effect of other opioids, initial encounter: Secondary | ICD-10-CM | POA: Diagnosis present

## 2022-12-24 DIAGNOSIS — K760 Fatty (change of) liver, not elsewhere classified: Secondary | ICD-10-CM | POA: Diagnosis present

## 2022-12-24 DIAGNOSIS — Z8249 Family history of ischemic heart disease and other diseases of the circulatory system: Secondary | ICD-10-CM

## 2022-12-24 DIAGNOSIS — M25552 Pain in left hip: Secondary | ICD-10-CM | POA: Diagnosis present

## 2022-12-24 LAB — CBC WITH DIFFERENTIAL/PLATELET
Abs Immature Granulocytes: 0.03 10*3/uL (ref 0.00–0.07)
Basophils Absolute: 0 10*3/uL (ref 0.0–0.1)
Basophils Relative: 0 %
Eosinophils Absolute: 0.1 10*3/uL (ref 0.0–0.5)
Eosinophils Relative: 1 %
HCT: 26.2 % — ABNORMAL LOW (ref 36.0–46.0)
Hemoglobin: 8.3 g/dL — ABNORMAL LOW (ref 12.0–15.0)
Immature Granulocytes: 0 %
Lymphocytes Relative: 12 %
Lymphs Abs: 1.1 10*3/uL (ref 0.7–4.0)
MCH: 28.9 pg (ref 26.0–34.0)
MCHC: 31.7 g/dL (ref 30.0–36.0)
MCV: 91.3 fL (ref 80.0–100.0)
Monocytes Absolute: 0.5 10*3/uL (ref 0.1–1.0)
Monocytes Relative: 5 %
Neutro Abs: 8 10*3/uL — ABNORMAL HIGH (ref 1.7–7.7)
Neutrophils Relative %: 82 %
Platelets: 356 10*3/uL (ref 150–400)
RBC: 2.87 MIL/uL — ABNORMAL LOW (ref 3.87–5.11)
RDW: 14.3 % (ref 11.5–15.5)
WBC: 9.8 10*3/uL (ref 4.0–10.5)
nRBC: 0 % (ref 0.0–0.2)

## 2022-12-24 LAB — URINALYSIS, W/ REFLEX TO CULTURE (INFECTION SUSPECTED)
Bilirubin Urine: NEGATIVE
Glucose, UA: NEGATIVE mg/dL
Hgb urine dipstick: NEGATIVE
Ketones, ur: 5 mg/dL — AB
Leukocytes,Ua: NEGATIVE
Nitrite: NEGATIVE
Protein, ur: 100 mg/dL — AB
Specific Gravity, Urine: 1.036 — ABNORMAL HIGH (ref 1.005–1.030)
pH: 6 (ref 5.0–8.0)

## 2022-12-24 LAB — COMPREHENSIVE METABOLIC PANEL
ALT: 13 U/L (ref 0–44)
AST: 17 U/L (ref 15–41)
Albumin: 3.1 g/dL — ABNORMAL LOW (ref 3.5–5.0)
Alkaline Phosphatase: 88 U/L (ref 38–126)
Anion gap: 10 (ref 5–15)
BUN: 9 mg/dL (ref 8–23)
CO2: 25 mmol/L (ref 22–32)
Calcium: 8.7 mg/dL — ABNORMAL LOW (ref 8.9–10.3)
Chloride: 101 mmol/L (ref 98–111)
Creatinine, Ser: 0.86 mg/dL (ref 0.44–1.00)
GFR, Estimated: >60 mL/min (ref >60)
Glucose, Bld: 136 mg/dL — ABNORMAL HIGH (ref 70–99)
Potassium: 3.8 mmol/L (ref 3.5–5.1)
Sodium: 136 mmol/L (ref 135–145)
Total Bilirubin: 0.4 mg/dL (ref 0.3–1.2)
Total Protein: 6.8 g/dL (ref 6.5–8.1)

## 2022-12-24 LAB — GLUCOSE, CAPILLARY
Glucose-Capillary: 111 mg/dL — ABNORMAL HIGH (ref 70–99)
Glucose-Capillary: 95 mg/dL (ref 70–99)

## 2022-12-24 LAB — LIPASE, BLOOD: Lipase: 22 U/L (ref 11–51)

## 2022-12-24 MED ORDER — ONDANSETRON HCL 4 MG/2ML IJ SOLN
4.0000 mg | Freq: Once | INTRAMUSCULAR | Status: AC
  Administered 2022-12-24: 4 mg via INTRAVENOUS
  Filled 2022-12-24: qty 2

## 2022-12-24 MED ORDER — FUROSEMIDE 40 MG PO TABS
40.0000 mg | ORAL_TABLET | Freq: Every day | ORAL | Status: DC
  Administered 2022-12-24 – 2022-12-30 (×7): 40 mg via ORAL
  Filled 2022-12-24: qty 1
  Filled 2022-12-24: qty 2
  Filled 2022-12-24 (×4): qty 1
  Filled 2022-12-24: qty 2
  Filled 2022-12-24: qty 1

## 2022-12-24 MED ORDER — ACETAMINOPHEN 325 MG PO TABS
650.0000 mg | ORAL_TABLET | Freq: Four times a day (QID) | ORAL | Status: DC | PRN
  Administered 2022-12-24 – 2022-12-26 (×2): 650 mg via ORAL
  Filled 2022-12-24: qty 2

## 2022-12-24 MED ORDER — INSULIN ASPART 100 UNIT/ML IJ SOLN
0.0000 [IU] | Freq: Three times a day (TID) | INTRAMUSCULAR | Status: DC
  Administered 2022-12-26 – 2022-12-28 (×4): 2 [IU] via SUBCUTANEOUS
  Administered 2022-12-29: 4 [IU] via SUBCUTANEOUS
  Administered 2022-12-30: 2 [IU] via SUBCUTANEOUS

## 2022-12-24 MED ORDER — ASPIRIN 81 MG PO TBEC
81.0000 mg | DELAYED_RELEASE_TABLET | Freq: Every day | ORAL | Status: DC
  Administered 2022-12-24 – 2022-12-25 (×2): 81 mg via ORAL
  Filled 2022-12-24 (×2): qty 1

## 2022-12-24 MED ORDER — HYDROMORPHONE HCL 1 MG/ML IJ SOLN
1.0000 mg | Freq: Once | INTRAMUSCULAR | Status: AC
  Administered 2022-12-24: 1 mg via INTRAVENOUS
  Filled 2022-12-24: qty 1

## 2022-12-24 MED ORDER — SENNA 8.6 MG PO TABS
1.0000 | ORAL_TABLET | Freq: Two times a day (BID) | ORAL | Status: DC
  Administered 2022-12-24 – 2022-12-28 (×8): 8.6 mg via ORAL
  Filled 2022-12-24 (×9): qty 1

## 2022-12-24 MED ORDER — LACTATED RINGERS IV BOLUS
500.0000 mL | Freq: Once | INTRAVENOUS | Status: AC
  Administered 2022-12-24: 500 mL via INTRAVENOUS

## 2022-12-24 MED ORDER — METOPROLOL TARTRATE 25 MG PO TABS
12.5000 mg | ORAL_TABLET | Freq: Two times a day (BID) | ORAL | Status: DC
  Administered 2022-12-24 – 2022-12-30 (×11): 12.5 mg via ORAL
  Filled 2022-12-24 (×13): qty 1

## 2022-12-24 MED ORDER — CLOPIDOGREL BISULFATE 75 MG PO TABS
75.0000 mg | ORAL_TABLET | Freq: Every day | ORAL | Status: DC
  Administered 2022-12-24 – 2022-12-25 (×2): 75 mg via ORAL
  Filled 2022-12-24 (×2): qty 1

## 2022-12-24 MED ORDER — POLYETHYLENE GLYCOL 3350 17 GM/SCOOP PO POWD
1.0000 | Freq: Once | ORAL | Status: AC
  Administered 2022-12-24: 255 g via ORAL
  Filled 2022-12-24: qty 255

## 2022-12-24 MED ORDER — HYDRALAZINE HCL 25 MG PO TABS
25.0000 mg | ORAL_TABLET | Freq: Three times a day (TID) | ORAL | Status: DC
  Administered 2022-12-24 – 2022-12-30 (×16): 25 mg via ORAL
  Filled 2022-12-24 (×18): qty 1

## 2022-12-24 MED ORDER — UMECLIDINIUM-VILANTEROL 62.5-25 MCG/ACT IN AEPB
1.0000 | INHALATION_SPRAY | Freq: Every day | RESPIRATORY_TRACT | Status: DC
  Administered 2022-12-25 – 2022-12-30 (×6): 1 via RESPIRATORY_TRACT
  Filled 2022-12-24: qty 14

## 2022-12-24 MED ORDER — ONDANSETRON HCL 4 MG/2ML IJ SOLN: 4.0000 mg | Freq: Four times a day (QID) | INTRAMUSCULAR | Status: DC | PRN

## 2022-12-24 MED ORDER — LIDOCAINE HCL URETHRAL/MUCOSAL 2 % EX GEL
1.0000 | Freq: Once | CUTANEOUS | Status: AC
  Administered 2022-12-24: 1
  Filled 2022-12-24: qty 11

## 2022-12-24 MED ORDER — ACETAMINOPHEN 650 MG RE SUPP: 650.0000 mg | Freq: Four times a day (QID) | RECTAL | Status: DC | PRN

## 2022-12-24 MED ORDER — GABAPENTIN 300 MG PO CAPS
300.0000 mg | ORAL_CAPSULE | Freq: Two times a day (BID) | ORAL | Status: DC
  Administered 2022-12-24 – 2022-12-30 (×13): 300 mg via ORAL
  Filled 2022-12-24 (×13): qty 1

## 2022-12-24 MED ORDER — ONDANSETRON HCL 4 MG PO TABS: 4.0000 mg | ORAL_TABLET | Freq: Four times a day (QID) | ORAL | Status: DC | PRN

## 2022-12-24 MED ORDER — LEVETIRACETAM 500 MG PO TABS
500.0000 mg | ORAL_TABLET | Freq: Two times a day (BID) | ORAL | Status: DC
  Administered 2022-12-25 – 2022-12-30 (×11): 500 mg via ORAL
  Filled 2022-12-24 (×11): qty 1

## 2022-12-24 MED ORDER — AMLODIPINE BESYLATE 5 MG PO TABS
5.0000 mg | ORAL_TABLET | Freq: Every day | ORAL | Status: DC
  Administered 2022-12-24 – 2022-12-30 (×7): 5 mg via ORAL
  Filled 2022-12-24 (×7): qty 1

## 2022-12-24 MED ORDER — PIPERACILLIN-TAZOBACTAM 3.375 G IVPB 30 MIN: 3.3750 g | Freq: Once | INTRAVENOUS | Status: DC

## 2022-12-24 MED ORDER — MAGNESIUM CITRATE PO SOLN
1.0000 | Freq: Once | ORAL | Status: DC
  Filled 2022-12-24: qty 296

## 2022-12-24 MED ORDER — DICYCLOMINE HCL 10 MG/ML IM SOLN
20.0000 mg | Freq: Once | INTRAMUSCULAR | Status: AC
  Administered 2022-12-24: 20 mg via INTRAMUSCULAR
  Filled 2022-12-24: qty 2

## 2022-12-24 MED ORDER — IOHEXOL 350 MG/ML SOLN
75.0000 mL | Freq: Once | INTRAVENOUS | Status: AC | PRN
  Administered 2022-12-24: 75 mL via INTRAVENOUS

## 2022-12-24 MED ORDER — INSULIN DETEMIR 100 UNIT/ML ~~LOC~~ SOLN
7.0000 [IU] | Freq: Every day | SUBCUTANEOUS | Status: DC
  Administered 2022-12-24 – 2022-12-25 (×2): 7 [IU] via SUBCUTANEOUS
  Filled 2022-12-24 (×3): qty 0.07

## 2022-12-24 MED ORDER — ENOXAPARIN SODIUM 40 MG/0.4ML IJ SOSY
40.0000 mg | PREFILLED_SYRINGE | INTRAMUSCULAR | Status: DC
  Administered 2022-12-24 – 2022-12-25 (×2): 40 mg via SUBCUTANEOUS
  Filled 2022-12-24 (×2): qty 0.4
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
## 2022-12-24 NOTE — ED Notes (Signed)
Patient transported to CT 

## 2022-12-24 NOTE — Discharge Planning (Signed)
Pt currently active with [facility] for Home Hospice services as confirmed by [facility] with Vangie Bicker of [facility].  Pt will resume Home Hospice services should home be her disposition plan.

## 2022-12-24 NOTE — ED Triage Notes (Signed)
Patient arrives by EMS from home for c/o no BM x 3 weeks.   Patient is on opiates for chronic pain, reports using laxatives, and drinking prune juice with no relief.  Patient reports rectal pain.   EMS VITALS:  BP 162/98 HR 86 98% on 3L [place] RR 24 CBG 116

## 2022-12-24 NOTE — ED Provider Notes (Signed)
[place] EMERGENCY DEPARTMENT AT [facility] Provider Note   CSN: [id number] Arrival date & time: 12/24/22  4098     History {Add pertinent medical, surgical, social history, OB history to HPI:1} Chief Complaint  Patient presents with   Constipation    Maine is a 82 y.o. female.  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    Per triage note--Reports that she has not had a bowel movement for 3 weeks, is on opiates for chronic pain.  She is tries a lot of laxatives, drinking prune juice with no relief.    On my hx-She is not sure when her last bowel movement was but believes it was a few weeks ago.  Reports she does not believe she has had a bowel movement since she left the rehab facility.  She is currently living in an apartment with her son.  She thinks it could have been 2 weeks or more.  Reports she is not been able to eat or drink over the last few weeks with combination of low appetite, abdominal distention, as well as nausea and vomiting when she does eat or drink.  Reports vomiting a few times per day.  Reports she has abdominal pain that waxes and wanes and is located diffusely.  She also reports a sensation like she needs to have a bowel movement with rectal pain however has not been able to.  She notes fatigue.  Denies any increased shortness of breath from her baseline.  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     Cholelithiasis    Chronic back pain    COLONIC POLYPS 09/13/2007   COPD 04/22/2009   DEGENERATIVE JOINT DISEASE 06/15/2007   DEPRESSION 02/22/2008   Depression    Diabetes mellitus (HCC)    Diverticulosis    Fatty liver    Gastritis    Gastroparesis    GERD 10/29/2006   Heart failure with preserved ejection fraction (HCC)    Hyperlipidemia    Hypertension    Migraine    Myocardial infarction (HCC)    Neuropathy    Obesity    OSA (obstructive sleep apnea)    "quit wearing my CPAP" (11/22/2012)   OSTEOPOROSIS 10/29/2006   SEIZURE DISORDER    "silent seizures" (11/22/2012)    Home Medications Prior to Admission medications   Medication Sig Start Date End Date Taking? Authorizing Provider  acetaminophen (TYLENOL) 325 MG tablet Take 2 tablets (650 mg total) by mouth every 4 (four) hours as  needed for mild pain or moderate pain (or Fever >/= 101). 11/05/22   Lewie Chamber, MD  albuterol (PROVENTIL HFA) 108 (90 Base) MCG/ACT inhaler Inhale 2 puffs into the lungs every 4 (four) hours as needed for wheezing or shortness of breath. 06/12/17   Shon Hale, MD  amLODipine (NORVASC) 5 MG tablet Take 5 mg by mouth daily.    [provider]  aspirin EC 81 MG tablet Take 81 mg by mouth daily.    [provider]  clopidogrel (PLAVIX) 75 MG tablet Take 1 tablet (75 mg total) by mouth daily. 05/14/22   Chuck Hint, MD  furosemide (LASIX) 20 MG tablet Take 40 mg by mouth daily.     [provider]  gabapentin (NEURONTIN) 300 MG capsule TAKE 1 CAPSULE(300 MG) BY MOUTH TWICE DAILY Patient taking differently: Take 300 mg by mouth 2 (two) times daily. 05/09/18   Romero Belling, MD  glucose blood [facility] VERIO) test strip 1 each by Other route 2 (two) times daily. And lancets 2/day 07/20/17   Romero Belling, MD  hydrALAZINE (APRESOLINE) 25 MG tablet Take 1 tablet (25 mg total) by mouth every 8 (eight) hours. 11/05/22   Lewie Chamber, MD  Hyprom-Naphaz-Polysorb-Zn Sulf (CLEAR EYES COMPLETE OP) Place 2 drops into both eyes daily.    [provider]  insulin aspart (NOVOLOG) 100 UNIT/ML injection Inject 0-15 Units into the skin 3 (three) times daily with meals. 11/05/22   Lewie Chamber, MD  ipratropium-albuterol (DUONEB) 0.5-2.5 (3) MG/3ML SOLN Take 3 mLs by nebulization every 6 (six) hours as needed. Patient taking differently: Take 3 mLs by nebulization every 6 (six) hours as needed (Shortness of breath). 06/12/17   Shon Hale, MD  LANTUS SOLOSTAR 100 UNIT/ML Solostar Pen Inject 10 Units into the skin daily. 11/05/22   Lewie Chamber, MD  metoprolol tartrate (LOPRESSOR) 25 MG tablet Take 0.5 tablets (12.5 mg total) by mouth 2 (two) times daily. 11/05/22   Lewie Chamber, MD  Multiple Vitamin (MULTIVITAMIN WITH MINERALS) TABS tablet Take 1 tablet by mouth daily. 11/05/22   Lewie Chamber, MD  nicotine (NICODERM CQ - DOSED IN MG/24 HOURS) 14 mg/24hr patch Place 1 patch (14 mg total) onto the skin daily. 11/05/22   Lewie Chamber, MD  potassium chloride SA (KLOR-CON M) 20 MEQ tablet Take 1 tablet (20 mEq total) by mouth daily. 11/05/22   Lewie Chamber, MD  SPIRIVA HANDIHALER 18 MCG inhalation capsule Place 1 capsule into inhaler and inhale daily. 12/03/22   [provider]  umeclidinium-vilanterol (ANORO ELLIPTA) 62.5-25 MCG/ACT AEPB Inhale 1 puff into the lungs daily. 11/06/22   Lewie Chamber, MD  promethazine (PHENERGAN) 25 MG tablet Take 1 tablet (25 mg total)  by mouth every 6 (six) hours as needed for nausea. 06/25/15 06/25/15  Derwood Kaplan, MD      Allergies    Hydrocodone, Lisinopril, Pioglitazone, and Varenicline tartrate    Review of Systems   Review of Systems  Physical Exam Updated Vital Signs BP (!) 187/147   Pulse 75   Temp 97.9 F (36.6 C) (Oral)   Resp (!) 22   Ht 5\' 6"  (1.676 m)   Wt 129.7 kg   SpO2 100%   BMI 46.15 kg/m  Physical Exam  ED Results / Procedures / Treatments   Labs (all labs ordered are listed, but only abnormal results are displayed) Labs Reviewed - No data to display  EKG None  Radiology No results found.  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    Differential diagnosis includes bowel obstruction, constipation, dehydration electrolyte abnormalities, anemia, AAA, rectal impaction, other acute intra-abdominal infection.  EKG was completed and personally evaluated by me with concern for possible electrolyte abnormalities given history of not eating or drinking, shows a normal sinus rhythm without acute ST changes.  Labs are completed and personally evaluated and interpreted by me show hemoglobin of 8.3, stable in comparison to recent prior, no leukocytosis, no acute kidney injury or  electrolyte abnormality, no sign of pancreatitis or hepatitis.  CT abdomen pelvis completed showing***  {Document critical care time when appropriate:1} {Document review of labs and clinical decision tools ie heart score, Chads2Vasc2 etc:1}  {Document your independent review of radiology images, and any outside records:1} {Document your discussion with family members, caretakers, and with consultants:1} {Document social determinants of health affecting pt's care:1} {Document your decision making why or why not admission, treatments were needed:1} Final Clinical Impression(s) / ED Diagnoses Final diagnoses:  None    Rx / DC Orders ED Discharge Orders     None

## 2022-12-24 NOTE — Progress Notes (Signed)
ED Pharmacy Antibiotic Sign Off An antibiotic consult was received from an ED provider for intra-abdominal infection per pharmacy dosing for zosyn. A chart review was completed to assess appropriateness.   The following one time order(s) were placed:  Zosyn 3.375G IV x1  Further antibiotic and/or antibiotic pharmacy consults should be ordered by the admitting provider if indicated.   Thank you for allowing pharmacy to be a part of this patient's care.   Smitty Cords, [facility]  Clinical Pharmacist 12/24/22 2:41 PM
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
## 2022-12-25 DIAGNOSIS — K5289 Other specified noninfective gastroenteritis and colitis: Secondary | ICD-10-CM | POA: Diagnosis not present

## 2022-12-25 LAB — GLUCOSE, CAPILLARY
Glucose-Capillary: 80 mg/dL (ref 70–99)
Glucose-Capillary: 83 mg/dL (ref 70–99)
Glucose-Capillary: 74 mg/dL (ref 70–99)
Glucose-Capillary: 89 mg/dL (ref 70–99)
Glucose-Capillary: 112 mg/dL — ABNORMAL HIGH (ref 70–99)

## 2022-12-25 LAB — CBC
HCT: 24 % — ABNORMAL LOW (ref 36.0–46.0)
Hemoglobin: 7.4 g/dL — ABNORMAL LOW (ref 12.0–15.0)
MCH: 27.8 pg (ref 26.0–34.0)
MCHC: 30.8 g/dL (ref 30.0–36.0)
MCV: 90.2 fL (ref 80.0–100.0)
Platelets: 313 10*3/uL (ref 150–400)
RBC: 2.66 MIL/uL — ABNORMAL LOW (ref 3.87–5.11)
RDW: 14.6 % (ref 11.5–15.5)
WBC: 11.3 10*3/uL — ABNORMAL HIGH (ref 4.0–10.5)
nRBC: 0 % (ref 0.0–0.2)

## 2022-12-25 LAB — CREATININE, SERUM
Creatinine, Ser: 0.79 mg/dL (ref 0.44–1.00)
GFR, Estimated: >60 mL/min (ref >60)

## 2022-12-25 MED ORDER — POLYETHYLENE GLYCOL 3350 17 G PO PACK
17.0000 g | PACK | Freq: Two times a day (BID) | ORAL | Status: DC
  Administered 2022-12-25 – 2022-12-30 (×11): 17 g via ORAL
  Filled 2022-12-25 (×10): qty 1

## 2022-12-25 MED ORDER — POLYETHYLENE GLYCOL 3350 17 G PO PACK: 17.0000 g | PACK | Freq: Every day | ORAL | Status: DC

## 2022-12-25 MED ORDER — SORBITOL 70 % SOLN
960.0000 mL | TOPICAL_OIL | Freq: Once | ORAL | Status: AC
  Administered 2022-12-25: 960 mL via RECTAL
  Filled 2022-12-25: qty 240

## 2022-12-25 MED ORDER — BISACODYL 10 MG RE SUPP
10.0000 mg | Freq: Once | RECTAL | Status: AC
  Administered 2022-12-25: 10 mg via RECTAL
  Filled 2022-12-25: qty 1

## 2022-12-25 MED ORDER — MAGNESIUM CITRATE PO SOLN
0.5000 | Freq: Once | ORAL | Status: AC
  Administered 2022-12-25: 0.5 via ORAL
  Filled 2022-12-25: qty 296

## 2022-12-25 MED ORDER — TAMSULOSIN HCL 0.4 MG PO CAPS
0.4000 mg | ORAL_CAPSULE | Freq: Every day | ORAL | Status: DC
  Administered 2022-12-25 – 2022-12-30 (×6): 0.4 mg via ORAL
  Filled 2022-12-25 (×6): qty 1

## 2022-12-25 NOTE — Plan of Care (Signed)
  Problem: Clinical Measurements: Goal: Will remain free from infection Outcome: Progressing Goal: Diagnostic test results will improve Outcome: Progressing Goal: Respiratory complications will improve Outcome: Progressing Goal: Cardiovascular complication will be avoided Outcome: Progressing   Problem: Nutrition: Goal: Adequate nutrition will be maintained Outcome: Progressing   Problem: Coping: Goal: Level of anxiety will decrease Outcome: Progressing   Problem: Pain Managment: Goal: General experience of comfort will improve Outcome: Progressing   Problem: Safety: Goal: Ability to remain free from injury will improve Outcome: Progressing   Problem: Skin Integrity: Goal: Risk for impaired skin integrity will decrease Outcome: Progressing   

## 2022-12-25 NOTE — Progress Notes (Signed)
PROGRESS NOTE  [place]  EXB:[id number] DOB: [birth year] DOA: 12/24/2022 PCP: System, Provider Not In   Brief Narrative: Patient is a 82 year old female with history of chronic debility/nonambulatory, COPD on 2 L of oxygen at home, diabetes mellitus, cirrhosis, gastroparesis, hypertension, hyperlipidemia, CVA who presents with not having bowel movement for 3 weeks, abdominal pain, nausea or vomiting.  Patient is on chronic aspirin therapy for chronic pain syndrome.  She lives in an apartment with her son.  CT abdomen/pelvis showed enlarging rectal stool ball with increased rectal wall thickening inflammatory changes concerning for stercoral colitis.  Manual disimpaction was tried in the ED which was unsuccessful.  Patient subsequently admitted.  Currently trying smog enema, repeat disimpaction.  Assessment & Plan:  Principal Problem:   Stercoral colitis   Severe constipation/stercoral colitis: Likely opioid-induced.  Not having bowel movements for the last 3 weeks.  On opiate therapy at home.  CT imaging as above.    Manual disimpaction was tried in the ED which was unsuccessful but as per the report, she had a small bowel movement on 9/6 after admission.  Currently trying smog enema, repeat disimpaction.  Continue MiraLAX twice daily, Senokot.  This morning her abdomen is soft and nondistended and mostly nontender.  Bowel sounds are present  Chronic hypoxic respiratory failure/sleep apnea: on 2 L of oxygen at home for COPD.  Currently on the same.  Continue home inhaler.  Not in exacerbation.She  refuses to see with CPAP machine at home as per the daughter  History of epilepsy: On Keppra, gabapentin  History of cirrhosis/gastroparesis: Follows with [facility]  History of diastolic CHF: Last echo showed EF of 55 to 60%.  On Lasix at home which has been continued  Hypertension: Continue amlodipine, Lopressor, Lasix  Hyperlipidemia: On Lipitor  Diabetes type 2: On sliding scale  insulin.  Monitor blood sugars  History of CVA: On aspirin, statin, Plavix.  Patient is nonambulatory and uses wheelchair  History of right upper lung cancer:  Follows with Dr. Genella Rife radiation/treatment  Tobacco abuse: Continue nicotine patch  Urinary retention: This is most likely complicated by constipation.  Foley will be placed  Normocytic anemia: Hemoglobin ranging from 7-8.  Likely chronic.  Will check iron studies  Deconditioning/Morbid obesity: BMI of 46.1.  Patient is says she cannot walk, ambulates with the help of wheelchair.  Lives with daughter        DVT prophylaxis:enoxaparin (LOVENOX) injection 40 mg Start: 12/24/22 1500 SCDs Start: 12/24/22 1445     Code Status: Full Code  Family Communication:: Discussed with daughter on phone on 9/7  Patient status:Obs  Patient is from :Home  Anticipated discharge NU:UVOZ  Estimated DC date:1-2 days, after having regular bowel movements   Consultants: None  Procedures: None  Antimicrobials:  Anti-infectives (From admission, onward)    Start     Dose/Rate Route Frequency Ordered Stop   12/24/22 1445  piperacillin-tazobactam (ZOSYN) IVPB 3.375 g  Status:  Discontinued        3.375 g 100 mL/hr over 30 Minutes Intravenous  Once 12/24/22 1441 12/24/22 1446       Subjective:  Patient seen and examined at bedside today.  She was in deep sleep during my arrival.  She did not complain of any abdominal pain, nausea or vomiting.  Her abdomen is mostly soft, nontender, nondistended, bowel sounds are present.  On 2 L of oxygen per minute.  Denies any shortness of breath or cough  Objective: Vitals:   12/25/22 0104  12/25/22 0453 12/25/22 0736 12/25/22 0821  BP: (!) 143/83 (!) 143/55 (!) 141/66   Pulse: 64 69 79   Resp: 16 20 16    Temp: 97.9 F (36.6 C) 98.5 F (36.9 C) 98.4 F (36.9 C)   TempSrc: Oral Oral Oral   SpO2: 97% (!) 31% 97% 97%  Weight:      Height:        Intake/Output Summary (Last 24  hours) at 12/25/2022 1120 Last data filed at 12/25/2022 0430 Gross per 24 hour  Intake 600 ml  Output 1800 ml  Net -1200 ml   Filed Weights   12/24/22 0728  Weight: 129.7 kg    Examination:  General exam: Overall comfortable, not in distress, appears significantly deconditioned, morbidly obese HEENT: PERRL Respiratory system:  no wheezes or crackles  Cardiovascular system: S1 & S2 heard, RRR.  Gastrointestinal system: Abdomen is nondistended, soft and nontender. Central nervous system: Alert and oriented Extremities: No edema, no clubbing ,no cyanosis Skin: No rashes, no ulcers,no icterus     Data Reviewed: I have personally reviewed following labs and imaging studies  CBC: Recent Labs  Lab 12/24/22 0815 12/25/22 0852  WBC 9.8 11.3*  NEUTROABS 8.0*  --   HGB 8.3* 7.4*  HCT 26.2* 24.0*  MCV 91.3 90.2  PLT 356 313   Basic Metabolic Panel: Recent Labs  Lab 12/24/22 0815 12/25/22 0852  NA 136  --   K 3.8  --   CL 101  --   CO2 25  --   GLUCOSE 136*  --   BUN 9  --   CREATININE 0.86 0.79  CALCIUM 8.7*  --      No results found for this or any previous visit (from the past 240 hour(s)).   Radiology Studies: CT ABDOMEN PELVIS W CONTRAST  Result Date: 12/24/2022 CLINICAL DATA:  Abdominal pain with nausea and vomiting. EXAM: CT ABDOMEN AND PELVIS WITH CONTRAST TECHNIQUE: Multidetector CT imaging of the abdomen and pelvis was performed using the standard protocol following bolus administration of intravenous contrast. RADIATION DOSE REDUCTION: This exam was performed according to the departmental dose-optimization program which includes automated exposure control, adjustment of the mA and/or kV according to patient size and/or use of iterative reconstruction technique. CONTRAST:  75mL OMNIPAQUE IOHEXOL 350 MG/ML SOLN COMPARISON:  CT abdomen pelvis dated December 04, 2022. FINDINGS: Lower chest: No acute abnormality. Hepatobiliary: No focal liver abnormality. Unchanged large  gallstone. No gallbladder wall thickening or biliary dilatation. Pancreas: Unremarkable. No pancreatic ductal dilatation or surrounding inflammatory changes. Spleen: Normal in size without focal abnormality. Adrenals/Urinary Tract: Unchanged nodular thickening of the left adrenal gland. Normal right adrenal gland. No renal calculi, mass, or hydronephrosis. The bladder is unremarkable. Stomach/Bowel: Increased stool burden in the left colon. Enlarging rectal stool ball with increased rectal wall thickening, surrounding inflammatory change, and presacral edema. No obstruction. Prior appendectomy. Vascular/Lymphatic: Aortic atherosclerosis. No enlarged abdominal or pelvic lymph nodes. Reproductive: Status post hysterectomy. No adnexal masses. Other: No free fluid or pneumoperitoneum. Musculoskeletal: No acute or significant osseous findings. IMPRESSION: 1. Enlarging rectal stool ball with increased rectal wall thickening, surrounding inflammatory change, and presacral edema, concerning for stercoral colitis. 2. Increased stool burden in the left colon.  No obstruction. 3. Unchanged cholelithiasis. 4.  Aortic Atherosclerosis (ICD10-I70.0). Electronically Signed   By: Obie Dredge M.D.   On: 12/24/2022 12:58    Scheduled Meds:  amLODipine  5 mg Oral Daily   aspirin EC  81 mg Oral Daily  clopidogrel  75 mg Oral Daily   enoxaparin (LOVENOX) injection  40 mg Subcutaneous Q24H   furosemide  40 mg Oral Daily   gabapentin  300 mg Oral BID   hydrALAZINE  25 mg Oral Q8H   insulin aspart  0-24 Units Subcutaneous TID WC   insulin detemir  7 Units Subcutaneous Daily   levETIRAcetam  500 mg Oral BID   metoprolol tartrate  12.5 mg Oral BID   polyethylene glycol  17 g Oral BID   senna  1 tablet Oral BID   sorbitol, milk of mag, mineral oil, glycerin (SMOG) enema  960 mL Rectal Once   umeclidinium-vilanterol  1 puff Inhalation Daily   Continuous Infusions:   LOS: 0 days   Burnadette Pop, MD [facility] P9/10/2022, 11:20 AM

## 2022-12-26 DIAGNOSIS — K5641 Fecal impaction: Secondary | ICD-10-CM | POA: Diagnosis not present

## 2022-12-26 DIAGNOSIS — Z515 Encounter for palliative care: Secondary | ICD-10-CM

## 2022-12-26 DIAGNOSIS — D649 Anemia, unspecified: Secondary | ICD-10-CM

## 2022-12-26 DIAGNOSIS — Z7189 Other specified counseling: Secondary | ICD-10-CM

## 2022-12-26 DIAGNOSIS — K625 Hemorrhage of anus and rectum: Secondary | ICD-10-CM | POA: Diagnosis not present

## 2022-12-26 DIAGNOSIS — R52 Pain, unspecified: Secondary | ICD-10-CM | POA: Diagnosis not present

## 2022-12-26 DIAGNOSIS — K5289 Other specified noninfective gastroenteritis and colitis: Secondary | ICD-10-CM | POA: Diagnosis not present

## 2022-12-26 LAB — GLUCOSE, CAPILLARY
Glucose-Capillary: 106 mg/dL — ABNORMAL HIGH (ref 70–99)
Glucose-Capillary: 133 mg/dL — ABNORMAL HIGH (ref 70–99)
Glucose-Capillary: 143 mg/dL — ABNORMAL HIGH (ref 70–99)
Glucose-Capillary: 132 mg/dL — ABNORMAL HIGH (ref 70–99)

## 2022-12-26 LAB — IRON AND TIBC
Iron: 13 ug/dL — ABNORMAL LOW (ref 28–170)
Saturation Ratios: 6 % — ABNORMAL LOW (ref 10.4–31.8)
TIBC: 207 ug/dL — ABNORMAL LOW (ref 250–450)
UIBC: 194 ug/dL

## 2022-12-26 LAB — CBC
HCT: 21.7 % — ABNORMAL LOW (ref 36.0–46.0)
Hemoglobin: 6.6 g/dL — CL (ref 12.0–15.0)
MCH: 27.5 pg (ref 26.0–34.0)
MCHC: 30.4 g/dL (ref 30.0–36.0)
MCV: 90.4 fL (ref 80.0–100.0)
Platelets: 296 10*3/uL (ref 150–400)
RBC: 2.4 MIL/uL — ABNORMAL LOW (ref 3.87–5.11)
RDW: 14.6 % (ref 11.5–15.5)
WBC: 9.2 10*3/uL (ref 4.0–10.5)
nRBC: 0 % (ref 0.0–0.2)

## 2022-12-26 LAB — ABO/RH: ABO/RH(D): O POS

## 2022-12-26 LAB — BASIC METABOLIC PANEL
Anion gap: 9 (ref 5–15)
BUN: 10 mg/dL (ref 8–23)
CO2: 29 mmol/L (ref 22–32)
Calcium: 8.3 mg/dL — ABNORMAL LOW (ref 8.9–10.3)
Chloride: 101 mmol/L (ref 98–111)
Creatinine, Ser: 0.8 mg/dL (ref 0.44–1.00)
GFR, Estimated: >60 mL/min (ref >60)
Glucose, Bld: 97 mg/dL (ref 70–99)
Potassium: 3.9 mmol/L (ref 3.5–5.1)
Sodium: 139 mmol/L (ref 135–145)

## 2022-12-26 LAB — HEMOGLOBIN AND HEMATOCRIT, BLOOD
HCT: 22.7 % — ABNORMAL LOW (ref 36.0–46.0)
Hemoglobin: 7 g/dL — ABNORMAL LOW (ref 12.0–15.0)

## 2022-12-26 LAB — PREPARE RBC (CROSSMATCH)

## 2022-12-26 MED ORDER — PANTOPRAZOLE SODIUM 40 MG PO TBEC
40.0000 mg | DELAYED_RELEASE_TABLET | Freq: Two times a day (BID) | ORAL | Status: DC
  Administered 2022-12-26 – 2022-12-30 (×9): 40 mg via ORAL
  Filled 2022-12-26 (×9): qty 1

## 2022-12-26 MED ORDER — MORPHINE SULFATE (PF) 2 MG/ML IV SOLN
2.0000 mg | Freq: Once | INTRAVENOUS | Status: AC
  Administered 2022-12-26: 2 mg via INTRAVENOUS
  Filled 2022-12-26: qty 1

## 2022-12-26 MED ORDER — SODIUM CHLORIDE 0.9 % IV SOLN
250.0000 mg | Freq: Every day | INTRAVENOUS | Status: AC
  Administered 2022-12-26 – 2022-12-27 (×2): 250 mg via INTRAVENOUS
  Filled 2022-12-26 (×2): qty 20

## 2022-12-26 MED ORDER — SODIUM CHLORIDE 0.9% IV SOLUTION: Freq: Once | INTRAVENOUS | Status: AC

## 2022-12-26 NOTE — Progress Notes (Signed)
PROGRESS NOTE  [name]  WUJ:[id number] DOB: 01/26/1941 DOA: 12/24/2022 PCP: System, Provider Not In   Brief Narrative: Patient is a 82 year old female with history of chronic debility/nonambulatory, COPD on 2 L of oxygen at home, diabetes mellitus, cirrhosis, gastroparesis, hypertension, hyperlipidemia, lung cancer currently not in treatment,CVA who presents with not having bowel movement for 3 weeks, abdominal pain, nausea or vomiting. .  She lives in an apartment with her daughter.  CT abdomen/pelvis showed enlarging rectal stool ball with increased rectal wall thickening inflammatory changes concerning for stercoral colitis.  Manual disimpaction was tried in the ED which was unsuccessful.  Patient subsequently admitted.  After giving  smog enema,she started having bowel movements.  Due to her multiple comorbidities, poor quality of life and history of lung cancer, palliative care also consulted for goals of care  Assessment & Plan:  Principal Problem:   Stercoral colitis   Severe constipation/stercoral colitis: Likely opioid-induced.  Take tramadol at home.Not having bowel movements for the last 3 weeks.CT imaging as above.    Manual disimpaction was tried in the ED which was unsuccessful but as per the report, she had a small bowel movement on 9/6 after admission. Given smog enema, started having bowel movements. Continue MiraLAX twice daily, Senokot.  This morning her abdomen is soft and nondistended and mostly nontender.  Bowel sounds are present  Normocytic anemia: Hemoglobin dropped to the range of 6 today.  Had some bowel movement with bleeding.  Her hemoglobin was in the range of 9-10 about a month ago.  Started on Protonix.  Stopped Plavix, GI consulted.  Iron panel showed severe iron deficiency.  Will give her IV iron  Chronic hypoxic respiratory failure/sleep apnea: on 2 L of oxygen at home for COPD.  Currently on the same.  Continue home inhaler.  Not in exacerbation.She   refuses to see with CPAP machine at home as per the daughter  History of epilepsy: On Keppra, gabapentin  History of cirrhosis/gastroparesis: Follows with [facility]  History of diastolic CHF: Last echo showed EF of 55 to 60%.  On Lasix at home which has been continued  Hypertension: Continue amlodipine, Lopressor, Lasix  Hyperlipidemia: On Lipitor  Diabetes type 2: On sliding scale insulin.  Monitor blood sugars  History of CVA: On aspirin, statin, Plavix.  Patient is nonambulatory and uses wheelchair.  Plavix on hold  History of right upper lung cancer:  Follows with Dr. Genella Rife radiation/treatment  Tobacco abuse: Continue nicotine patch  Urinary retention: This is most likely complicated by constipation.  Foley  placed,voiding trial when possible  Deconditioning/Morbid obesity: BMI of 46.1.  Patient is says she cannot walk, ambulates with the help of wheelchair.  Lives with daughter  Goals of care: Elderly patient with several comorbidities.  She is a hospice candidate.  Had a brief discussion with daughter about goals of care, she is interested to talk to palliative care and consider comfort care if appropriate.  Will consult palliative care.        DVT prophylaxis:Place and maintain sequential compression device Start: 12/26/22 0013 SCDs Start: 12/24/22 1445     Code Status: Do not attempt resuscitation (DNR) PRE-ARREST INTERVENTIONS DESIRED  Family Communication:: Discussed with daughter on phone on 9/8  Patient status:Obs  Patient is from :Home  Anticipated discharge NF:[facility] vs residential hospice?  Estimated DC date:1-2 days, awaiting goals of care   Consultants: GI, palliative care  Procedures: None  Antimicrobials:  Anti-infectives (From admission, onward)    Start  Dose/Rate Route Frequency Ordered Stop   12/24/22 1445  piperacillin-tazobactam (ZOSYN) IVPB 3.375 g  Status:  Discontinued        3.375 g 100 mL/hr over 30 Minutes Intravenous   Once 12/24/22 1441 12/24/22 1446       Subjective:  Patient seen and examined at bedside today.  She finally started having bowel movements.  When I entered the room, she was lying in bed, found to be sleepy/drowsy.  Woke up on calling her name but falls asleep again.  Upon further conversation, she knew the current month.  Denies any abdomen pain  Objective: Vitals:   12/25/22 1944 12/26/22 0418 12/26/22 0735 12/26/22 1218  BP: (!) 134/57 (!) 125/58 (!) 153/55 (!) 122/48  Pulse: 71 70 66 75  Resp: 18 16 16 18   Temp: 98.6 F (37 C) 98.5 F (36.9 C) 98.2 F (36.8 C) 98.2 F (36.8 C)  TempSrc: Oral Oral Oral Oral  SpO2: 99% 99% 99% 99%  Weight:      Height:        Intake/Output Summary (Last 24 hours) at 12/26/2022 1444 Last data filed at 12/25/2022 2208 Gross per 24 hour  Intake 120 ml  Output 425 ml  Net -305 ml   Filed Weights   12/24/22 0728  Weight: 129.7 kg    Examination:  General exam: Overall comfortable, not in distress, appears significantly deconditioned, morbidly obese HEENT: PERRL Respiratory system:  no wheezes or crackles  Cardiovascular system: S1 & S2 heard, RRR.  Gastrointestinal system: Abdomen is nondistended, soft and nontender. Central nervous system: Alert and oriented Extremities: No edema, no clubbing ,no cyanosis Skin: No rashes, no ulcers,no icterus     Data Reviewed: I have personally reviewed following labs and imaging studies  CBC: Recent Labs  Lab 12/24/22 0815 12/25/22 0852 12/26/22 0111 12/26/22 0835  WBC 9.8 11.3*  --  9.2  NEUTROABS 8.0*  --   --   --   HGB 8.3* 7.4* 7.0* 6.6*  HCT 26.2* 24.0* 22.7* 21.7*  MCV 91.3 90.2  --  90.4  PLT 356 313  --  296   Basic Metabolic Panel: Recent Labs  Lab 12/24/22 0815 12/25/22 0852 12/26/22 0835  NA 136  --  139  K 3.8  --  3.9  CL 101  --  101  CO2 25  --  29  GLUCOSE 136*  --  97  BUN 9  --  10  CREATININE 0.86 0.79 0.80  CALCIUM 8.7*  --  8.3*     No results found  for this or any previous visit (from the past 240 hour(s)).   Radiology Studies: No results found.  Scheduled Meds:  amLODipine  5 mg Oral Daily   furosemide  40 mg Oral Daily   gabapentin  300 mg Oral BID   hydrALAZINE  25 mg Oral Q8H   insulin aspart  0-24 Units Subcutaneous TID WC   levETIRAcetam  500 mg Oral BID   metoprolol tartrate  12.5 mg Oral BID   pantoprazole  40 mg Oral BID   polyethylene glycol  17 g Oral BID   senna  1 tablet Oral BID   tamsulosin  0.4 mg Oral Daily   umeclidinium-vilanterol  1 puff Inhalation Daily   Continuous Infusions:   LOS: 0 days   Burnadette Pop, MD [facility] P9/11/2022, 2:44 PM

## 2022-12-26 NOTE — Consult Note (Signed)
Palliative Care Consult Note                                  Date: 12/26/2022   Patient Name: [name]  DOB: 1940-09-27  MRN: [id number]  Age / Sex: 82 y.o., female  PCP: System, Provider Not In Referring Physician: Burnadette Pop, MD  Reason for Consultation: Establishing goals of care  HPI/Patient Profile: 82 y.o. female  with past medical history of hypertension, hyperlipidemia, coronary artery disease s/p MI, diastolic CHF, CVA with residual right sided hemiparesis, PAD s/p angioplasty posterior tibial artery and left superficial femoral artery 04/2022 no longer on Plavix, COPD on home oxygen  2L [place], right upper lung nodule suspicious for stage Ia non-small cell lung cancer, sleep apnea, diabetes mellitus type GERD, 2, gastroparesis, gallstones, hepatic steatosis and colon polyps admitted on 12/24/2022 with abdominal pain and constipation (no bowel movement for 3 weeks).   She had a large stool ball. Manual disimpaction attempted in ED. She has since had a smog enema and a rectal exam in which she passed a large amount of stool.  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     Cholelithiasis    Chronic back pain    COLONIC POLYPS 09/13/2007   COPD 04/22/2009   DEGENERATIVE JOINT DISEASE 06/15/2007   DEPRESSION 02/22/2008   Depression    Diabetes mellitus (HCC)    Diverticulosis    Fatty liver    Gastritis    Gastroparesis    GERD 10/29/2006   Heart failure with preserved ejection fraction (HCC)    Hyperlipidemia     Hypertension    Migraine    Myocardial infarction (HCC)    Neuropathy    Obesity    OSA (obstructive sleep apnea)    "quit wearing my CPAP" (11/22/2012)   OSTEOPOROSIS 10/29/2006   SEIZURE DISORDER    "silent seizures" (11/22/2012)    Subjective:   I have reviewed medical records including EPIC notes, labs and imaging, assessed the patient and then met with the patient, her daughter [name], and her son [name] (by phone) to discuss diagnosis prognosis, GOC, EOL wishes, disposition and options.  I introduced Palliative Medicine as specialized medical care for people living with serious illness. It focuses on providing relief from symptoms and stress of a serious illness. The goal is to improve quality of life for both the patient and the family.  Today's Discussion: Patient and family were very emotional about the patient's condition and potential comfort care when I entered the room. I verified with the patient's children that she was on home hospice. She had opted not to have cancer therapy or radiation therapy for her lung cancer. She also  has many chronic conditions. They understand her overall prognosis is poor. According to the patient's daughter the patient had not had a bowel movement in nearly a month and she was having pain so they called EMS for help.  We discussed their goals and their goal is to get her constipation managed and then have her return home to hospice.   The patient is comfortable with dying and knows she will go to heaven. The family states they find some comfort in this but they are still having difficulty accepting she will not always be with them.The patient reports severe pain in her rectum so morphine dose ordered.  Discussed the importance of continued conversation with family and the medical providers regarding overall plan of care and treatment options, ensuring decisions are within the context of the patient's values and GOCs.  Questions and concerns were  addressed. The family was encouraged to call with questions or concerns. PMT will continue to support holistically.  Review of Systems  Gastrointestinal:  Positive for abdominal pain, blood in stool, constipation and rectal pain.    Objective:   Primary Diagnoses: Present on Admission:  Stercoral colitis   Physical Exam Vitals reviewed.  HENT:     Head: Normocephalic and atraumatic.  Pulmonary:     Effort: Pulmonary effort is normal.  Abdominal:     Palpations: Abdomen is soft.  Skin:    General: Skin is warm and dry.  Neurological:     Mental Status: She is alert.     Vital Signs:  BP (!) 122/48 (BP Location: Left Arm)   Pulse 75   Temp 98.2 F (36.8 C) (Oral)   Resp 18   Ht 5\' 6"  (1.676 m)   Wt 129.7 kg   SpO2 99%   BMI 46.15 kg/m    Advanced Care Planning:   Existing Vynca/ACP Documentation: None  Primary Decision Maker: The patient. When she is unable to make decisions her children make them for her.  Code Status/Advance Care Planning: DNR  Assessment & Plan:   SUMMARY OF RECOMMENDATIONS   DNR Constipation management Return to home hospice services at discharge Spiritual consult Continued PMT support   Discussed with: [facility], Dr. Renford Dills, bedside RN  Time Total: 90 minutes  Thank you for allowing [place] to participate in the care of [name] PMT will continue to support holistically.    Signed by: Sarina Ser, NP Palliative Medicine Team  Team Phone # [phone] (Nights/Weekends)  12/26/2022, 4:38 PM
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
## 2022-12-26 NOTE — Progress Notes (Signed)
Patient starting to have bright red blood in stool when having incontinent episodes. Patient also has very raw skin on bottom. Notified dr, see orders.

## 2022-12-26 NOTE — Plan of Care (Signed)
  Problem: Education: Goal: Knowledge of General Education information will improve Description Including pain rating scale, medication(s)/side effects and non-pharmacologic comfort measures Outcome: Progressing   

## 2022-12-27 ENCOUNTER — Observation Stay (HOSPITAL_COMMUNITY): Payer: Medicare Other

## 2022-12-27 ENCOUNTER — Inpatient Hospital Stay (HOSPITAL_BASED_OUTPATIENT_CLINIC_OR_DEPARTMENT_OTHER): Payer: Medicare PPO | Admitting: Pulmonary Disease

## 2022-12-27 DIAGNOSIS — K921 Melena: Secondary | ICD-10-CM | POA: Diagnosis not present

## 2022-12-27 DIAGNOSIS — D649 Anemia, unspecified: Secondary | ICD-10-CM | POA: Diagnosis not present

## 2022-12-27 DIAGNOSIS — Z515 Encounter for palliative care: Secondary | ICD-10-CM

## 2022-12-27 DIAGNOSIS — R531 Weakness: Secondary | ICD-10-CM | POA: Diagnosis not present

## 2022-12-27 DIAGNOSIS — K5289 Other specified noninfective gastroenteritis and colitis: Secondary | ICD-10-CM

## 2022-12-27 DIAGNOSIS — K5900 Constipation, unspecified: Secondary | ICD-10-CM

## 2022-12-27 LAB — BASIC METABOLIC PANEL
Anion gap: 5 (ref 5–15)
BUN: 10 mg/dL (ref 8–23)
CO2: 30 mmol/L (ref 22–32)
Calcium: 7.8 mg/dL — ABNORMAL LOW (ref 8.9–10.3)
Chloride: 100 mmol/L (ref 98–111)
Creatinine, Ser: 0.89 mg/dL (ref 0.44–1.00)
GFR, Estimated: >60 mL/min (ref >60)
Glucose, Bld: 107 mg/dL — ABNORMAL HIGH (ref 70–99)
Potassium: 3.7 mmol/L (ref 3.5–5.1)
Sodium: 135 mmol/L (ref 135–145)

## 2022-12-27 LAB — BPAM RBC
Blood Product Expiration Date: 202410042359
ISSUE DATE / TIME: 202409081820
Unit Type and Rh: 5100

## 2022-12-27 LAB — TYPE AND SCREEN
ABO/RH(D): O POS
Antibody Screen: NEGATIVE
Unit division: 0

## 2022-12-27 LAB — CBC
HCT: 24 % — ABNORMAL LOW (ref 36.0–46.0)
Hemoglobin: 7.6 g/dL — ABNORMAL LOW (ref 12.0–15.0)
MCH: 28.4 pg (ref 26.0–34.0)
MCHC: 31.7 g/dL (ref 30.0–36.0)
MCV: 89.6 fL (ref 80.0–100.0)
Platelets: 299 10*3/uL (ref 150–400)
RBC: 2.68 MIL/uL — ABNORMAL LOW (ref 3.87–5.11)
RDW: 14.6 % (ref 11.5–15.5)
WBC: 9.4 10*3/uL (ref 4.0–10.5)
nRBC: 0 % (ref 0.0–0.2)

## 2022-12-27 LAB — GLUCOSE, CAPILLARY
Glucose-Capillary: 126 mg/dL — ABNORMAL HIGH (ref 70–99)
Glucose-Capillary: 104 mg/dL — ABNORMAL HIGH (ref 70–99)
Glucose-Capillary: 149 mg/dL — ABNORMAL HIGH (ref 70–99)
Glucose-Capillary: 187 mg/dL — ABNORMAL HIGH (ref 70–99)

## 2022-12-27 MED ORDER — MORPHINE SULFATE (PF) 2 MG/ML IV SOLN
2.0000 mg | INTRAVENOUS | Status: DC | PRN
  Administered 2022-12-27 – 2022-12-29 (×6): 2 mg via INTRAVENOUS
  Filled 2022-12-27 (×6): qty 1

## 2022-12-27 NOTE — Progress Notes (Addendum)
PROGRESS NOTE  [name]  ZOX:[id number] DOB: 02-Nov-1940 DOA: 12/24/2022 PCP: System, Provider Not In   Brief Narrative: Patient is a 82 year old female with history of chronic debility/nonambulatory, COPD on 2 L of oxygen at home, diabetes mellitus, cirrhosis, gastroparesis, hypertension, hyperlipidemia, lung cancer currently not in treatment,CVA who presents with not having bowel movement for 3 weeks, abdominal pain, nausea or vomiting. .  She lives in an apartment with her daughter.  CT abdomen/pelvis showed enlarging rectal stool ball with increased rectal wall thickening inflammatory changes concerning for stercoral colitis.  Manual disimpaction was tried in the ED which was unsuccessful.  Patient subsequently admitted.  After giving  smog enema,she started having bowel movements.  Due to her multiple comorbidities, poor quality of life and history of lung cancer, palliative care also consulted for goals of care  Assessment & Plan:  Principal Problem:   Stercoral colitis   Severe constipation/stercoral colitis: Likely opioid-induced.  Take tramadol at home.Not having bowel movements for the last 3 weeks.CT imaging as above.    Manual disimpaction was tried in the ED which was unsuccessful but as per the report, she had a small bowel movement on 9/6 after admission. Given smog enema, started having bowel movements. Continue MiraLAX twice daily, Senokot.  This morning her abdomen is soft and nondistended and KUB shows non-obstructive pattern. GI is following, considering flex sig/colonoscopy.  Normocytic anemia: Hemoglobin dropped to the range of 6 yesterday.  Had some bowel movement with bleeding.  Her hemoglobin was in the range of 9-10 about a month ago.  Started on Protonix.  Stopped Plavix, GI consulted.  Iron panel showed severe iron deficiency.  Now s/p one unit, no report of bleeding since, repeat cbc pending  Left leg/hip pain Has known lumbar disease, no report of fall,  will check x-ray of left hip  Chronic hypoxic respiratory failure/sleep apnea: on 2 L of oxygen at home for COPD.  Currently on the same.  Continue home inhaler.  Not in exacerbation.She  refuses to see with CPAP machine at home as per the daughter  History of epilepsy: On Keppra, gabapentin  History of cirrhosis/gastroparesis: Follows with [facility]  History of diastolic CHF: Last echo showed EF of 55 to 60%.  On Lasix at home which has been continued  Hypertension: Continue amlodipine, Lopressor, Lasix  Hyperlipidemia: On Lipitor  Diabetes type 2: On sliding scale insulin.  Monitor blood sugars  History of CVA: On aspirin, statin, Plavix.  Patient is nonambulatory and uses wheelchair.  Plavix on hold as above  History of right upper lung cancer:  Follows with Dr. Genella Rife radiation/treatment  Tobacco abuse: Continue nicotine patch  Urinary retention: This is most likely complicated by constipation.  Foley  placed,voiding trial when possible  Deconditioning/Morbid obesity: BMI of 46.1.  Patient is says she cannot walk, ambulates with the help of wheelchair.  Lives with daughter  Goals of care: palliative following, plan is home w/ hospice   Decubitus ulcers, POA Sacral and heel, wound care is following        DVT prophylaxis:Place and maintain sequential compression device Start: 12/26/22 0013 SCDs Start: 12/24/22 1445     Code Status: Do not attempt resuscitation (DNR) PRE-ARREST INTERVENTIONS DESIRED  Family Communication:: daughter priscilla updated telephonically 9/9  Patient status:Obs  Patient is from :Home  Anticipated discharge WJ:XBJY w/ hospice  Estimated DC date:1-2 days pending gi eval   Consultants: GI, palliative care  Procedures: None  Antimicrobials:  Anti-infectives (From admission, onward)  Start     Dose/Rate Route Frequency Ordered Stop   12/24/22 1445  piperacillin-tazobactam (ZOSYN) IVPB 3.375 g  Status:  Discontinued         3.375 g 100 mL/hr over 30 Minutes Intravenous  Once 12/24/22 1441 12/24/22 1446       Subjective:  Today complaining of left upper leg pain  Objective: Vitals:   12/26/22 2040 12/26/22 2125 12/27/22 0422 12/27/22 0723  BP: (!) 128/47 (!) 148/56 (!) 115/56 137/65  Pulse: 65 (!) 59 64 72  Resp: 18 18 18 16   Temp: 98.4 F (36.9 C) 98.5 F (36.9 C) 98.7 F (37.1 C) 98.7 F (37.1 C)  TempSrc: Oral Oral Oral Oral  SpO2: 100% (!) 86% 100% 96%  Weight:      Height:        Intake/Output Summary (Last 24 hours) at 12/27/2022 1106 Last data filed at 12/26/2022 2124 Gross per 24 hour  Intake 897.73 ml  Output --  Net 897.73 ml   Filed Weights   12/24/22 0728  Weight: 129.7 kg    Examination:  General exam: tearful HEENT: PERRL Respiratory system:  no wheezes or crackles  Cardiovascular system: S1 & S2 heard, RRR.  Gastrointestinal system: Abdomen is nondistended, soft and nontender. Central nervous system: Alert and oriented Extremities: No edema, no clubbing ,no cyanosis Skin: no visible lesions   Data Reviewed: I have personally reviewed following labs and imaging studies  CBC: Recent Labs  Lab 12/24/22 0815 12/25/22 0852 12/26/22 0111 12/26/22 0835  WBC 9.8 11.3*  --  9.2  NEUTROABS 8.0*  --   --   --   HGB 8.3* 7.4* 7.0* 6.6*  HCT 26.2* 24.0* 22.7* 21.7*  MCV 91.3 90.2  --  90.4  PLT 356 313  --  296   Basic Metabolic Panel: Recent Labs  Lab 12/24/22 0815 12/25/22 0852 12/26/22 0835  NA 136  --  139  K 3.8  --  3.9  CL 101  --  101  CO2 25  --  29  GLUCOSE 136*  --  97  BUN 9  --  10  CREATININE 0.86 0.79 0.80  CALCIUM 8.7*  --  8.3*     No results found for this or any previous visit (from the past 240 hour(s)).   Radiology Studies: DG Abd 1 View  Result Date: 12/27/2022 CLINICAL DATA:  [id number] Constipation [id number] EXAM: ABDOMEN - 1 VIEW COMPARISON:  CT AP, 12/24/2022.  KUB, 05/06/2015. FINDINGS: Mild air-distention of transverse colon.  Nonobstructed bowel-gas pattern. No radio-opaque calculi or other significant radiographic abnormality are seen. IMPRESSION: Nonobstructed bowel-gas pattern. Electronically Signed   By: Roanna Banning M.D.   On: 12/27/2022 08:11    Scheduled Meds:  amLODipine  5 mg Oral Daily   furosemide  40 mg Oral Daily   gabapentin  300 mg Oral BID   hydrALAZINE  25 mg Oral Q8H   insulin aspart  0-24 Units Subcutaneous TID WC   levETIRAcetam  500 mg Oral BID   metoprolol tartrate  12.5 mg Oral BID   pantoprazole  40 mg Oral BID   polyethylene glycol  17 g Oral BID   senna  1 tablet Oral BID   tamsulosin  0.4 mg Oral Daily   umeclidinium-vilanterol  1 puff Inhalation Daily   Continuous Infusions:  ferric gluconate (FERRLECIT) IVPB 250 mg (12/26/22 1559)     LOS: 0 days   Silvano Bilis, MD [facility] P9/12/2022, 11:06 AM

## 2022-12-27 NOTE — Progress Notes (Signed)
This chaplain responded to PMT NP-Dawn consult  for spiritual care for the Pt. and family in the setting of anticipatory grief.   The chaplain arrived at the Pt. bedside along with the Pt. lunch. The chaplain learned the Pt. is very hungry; during the visit the Pt. finished her lunch.  The chaplain understands family is arriving from out of town for a family meeting pre-procedure. The Pt. shares reduced pain today, after her bowel movement. The Pt. requires bowel movement education before the RN dispenses medication.   The Pt. accepted the chaplain's invitation for storytelling.The chaplain learned the Pt. trusts her children to help her made medical decisions.  The Pt. 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  The Pt shares she held on to prayer as she experienced the pain with the previous impaction.   The Pt. Accepted the chaplain's invitation for F/U spiritual care.  Chaplain Stephanie Acre [phone]

## 2022-12-27 NOTE — Progress Notes (Addendum)
Progress Note   Subjective  Chief Complaint: IDA with constipation and rectal bleeding  This morning, patient tells me that she is hungry.  She is not sure if she has had any more bloody bowel movements and I cannot see where any further have been documented.  I cannot find nursing staff when I went by twice.  Also no numbers listed for nurses on the floor.   Objective   Vital signs in last 24 hours: Temp:  [98.1 F (36.7 C)-98.7 F (37.1 C)] 98.7 F (37.1 C) (09/09 0723) Pulse Rate:  [59-75] 72 (09/09 0723) Resp:  [16-20] 16 (09/09 0723) BP: (108-148)/(47-65) 137/65 (09/09 0723) SpO2:  [86 %-100 %] 96 % (09/09 0723) Last BM Date : 12/26/22 General:  Obese, chronically ill appearing  AA female in NAD Heart:  Regular rate and rhythm; no murmurs Lungs: Respirations even and unlabored, lungs CTA bilaterally Abdomen:  Soft, nontender and nondistended. Normal bowel sounds. Psych:  Cooperative. Normal mood and affect.  Intake/Output from previous day: 09/08 0701 - 09/09 0700 In: 897.7 [P.O.:240; Blood:358; IV Piggyback:299.7] Out: -    Lab Results: Recent Labs    12/25/22 0852 12/26/22 0111 12/26/22 0835  WBC 11.3*  --  9.2  HGB 7.4* 7.0* 6.6*  HCT 24.0* 22.7* 21.7*  PLT 313  --  296   BMET Recent Labs    12/25/22 0852 12/26/22 0835  NA  --  139  K  --  3.9  CL  --  101  CO2  --  29  GLUCOSE  --  97  BUN  --  10  CREATININE 0.79 0.80  CALCIUM  --  8.3*   Studies/Results: DG Abd 1 View  Result Date: 12/27/2022 CLINICAL DATA:  [id number] Constipation [id number] EXAM: ABDOMEN - 1 VIEW COMPARISON:  CT AP, 12/24/2022.  KUB, 05/06/2015. FINDINGS: Mild air-distention of transverse colon. Nonobstructed bowel-gas pattern. No radio-opaque calculi or other significant radiographic abnormality are seen. IMPRESSION: Nonobstructed bowel-gas pattern. Electronically Signed   By: Roanna Banning M.D.   On: 12/27/2022 08:11     Assessment / Plan:    Assessment: 1.  IDA with rectal  bleeding: Hemoglobin 7.4--> 7.0--> 6.6--> 1 unit PRBCs, repeat pending this morning, unable to find nursing staff to ask if she has had any further bloody bowel movements, initially CTAP identified enlarging rectal stool ball with increased rectal wall thickening with inflammatory changes and sacral edema concerning for stercoral colitis, this has been relieved with MiraLAX and a smog enema, labs pending from this morning, abdominal x-ray with nonobstructive bowel gas pattern, palliative care is also following 2.  Acute on chronic iron deficiency anemia: Secondary to chronic illness rectal bleeding 3.  Right upper lobe pulmonary nodule suspicious for stage Ia lung cancer 4.  GERD  Plan: 1.  Await labs from today, pending results may need to consider flex sig/colonoscopy, either way these could not be done today given prep excetra.  Will go ahead and allow the patient to eat and hold n.p.o. again for tomorrow so we can reconsider. 2.  Continue laxatives/bowel regimen 3.  Again if colonoscopy/flex sig were to be pursued or endoscopy would need to consult patient's family, patient is having palliative medicine ascertain goals of care. 4.  Continue to monitor hemoglobin and transfuse as needed less than 7 5.  Attempted to call patient's daughter [name] listed as her emergency contact and he she directed me to call just to discuss whether or not they would  want to pursue a flex sig/colonoscopy.  No one answered.  This will need to be readdressed if procedures are recommended.  Thank you for your kind consultation.  We will continue to follow.   LOS: 0 days   Unk Lightning  12/27/2022, 10:43 AM   Attending physician's note   I have taken a history, reviewed the chart and examined the patient. I performed a substantive portion of this encounter, including complete performance of at least one of the key components, in conjunction with the APP. I agree with the APP's note, impression and  recommendations.    82 year old female with iron deficiency anemia, small-volume hematochezia worsening constipation Hemoglobin responded appropriately to 1 unit PRBC transfusion Fecal impaction, severe constipation improved with MiraLAX and smog enema Likely etiology of hematochezia is rectal stercoral ulcer  Continue bowel regimen with MiraLAX Diet as tolerated Will hold off sigmoidoscopy or colonoscopy if hemoglobin remains stable with no further bleeding Updated patient and family at bedside GI will continue to follow along  The patient was provided an opportunity to ask questions and all were answered. The patient agreed with the plan and demonstrated an understanding of the instructions.   Iona Beard , MD [phone]

## 2022-12-27 NOTE — Progress Notes (Signed)
Patient ID: [name], female   DOB: [birth year], 82 y.o.   MRN: [id number]    Progress Note from the Palliative Medicine Team at [facility]   Patient Name: [name]        Date: 12/27/2022 DOB: [birth year]  Age: 82 y.o. MRN#: [id number] Attending Physician: Kathrynn Running, MD Primary Care Physician: System, Provider Not In Admit Date: 12/24/2022   Reason for Consultation/Follow-up   Establishing  goals of care   HPI/ Brief Hospital Review  82 year old female with history of chronic debility/nonambulatory, COPD on 2 L of oxygen at home, diabetes mellitus, cirrhosis, gastroparesis, hypertension, hyperlipidemia, lung cancer currently not in treatment,CVA who presents with not having bowel movement for 3 weeks, abdominal pain .   She lives in an apartment with her daughter.   CT abdomen/pelvis showed enlarging rectal stool ball with increased rectal wall thickening inflammatory changes concerning for stercoral colitis. Manual disimpaction was tried in the ED which was unsuccessful. Patient subsequently admitted. After giving smog enema,she started having bowel movements.   Due to her multiple comorbidities, poor quality of life and history of lung cancer, palliative care also consulted for goals of care   Patient and family face treatment option decisions, advanced directive decisions and anticipatory care needs.   Subjective  Extensive chart review has been completed prior to meeting with patient/family  including labs, vital signs, imaging, progress/consult notes, orders, medications and available advance directive documents.    This NP assessed patient at the bedside as a follow up for palliative medicine needs and emotional support.  Patient is alert and oriented, I worry about patient's insight into the seriousness and complexity of her medical situation.   I spoke briefly with daughter by phone.  Ongoing education regarding current medical  situation.  Discussed patient's severe constipation and possible association with anemia, bleeding.  She has COPD, refuses to use CPAP is on 2 L of oxygen at home.  Other comorbidities are CHF,  obesity, decreased mobility.   Explored patient's current hospice service..   Apparently she is with [facility] hosp[facility], I will reach out to them for further clarification   Education offered today regarding  the importance of continued conversation with family and their  medical providers regarding overall plan of care and treatment options,  ensuring decisions are within the context of the patients values and GOCs.  Questions and concerns addressed   Discussed with primary team and nursing staff  PMT will follow-up in the morning   Time: 50   minutes  ---  discussed with Dr. Cathie Beams  Detailed review of medical records ( labs, imaging, vital signs), medically appropriate exam ( MS, skin, cardia,  resp)   discussed with treatment team, counseling and education to patient, family, staff, documenting clinical information, medication management, coordination of care    Lorinda Creed NP  Palliative Medicine Team Team Phone # [phone] Pager [phone]

## 2022-12-27 NOTE — Consult Note (Signed)
WOC Nurse Consult Note: Reason for Consult: bilateral heels and sacrum Wound type: Unstageable Pressure Injury: right medial heel; 1cm x 1cm x 0.1cm; eschar with surrounding open white tissue Unstageable Pressure Injury: left lateral heel; 100% stable black eschar; 3cm x 3cm x 0cm  3. ICD (irritant contact dermatitis) buttocks; scattered partial thickness skin irritation from incontinence of B/B Pressure Injury POA: Yes Measurement: see above  Wound bed:see above  Drainage (amount, consistency, odor) none from the left heel; scant from the right heel; serous  Periwound: intact  Dressing procedure/placement/frequency: Add Prevalon offloading boots bilaterally, offload heels as much as possible. Cleanse bilateral heel wounds with saline, pat dry. Apply single layer of xeroform to the right medial wound, paint left lateral heel with betadine, cover with foam. Change xeroform and reapply betadine daily. Ok to lift foam and change every 3 days.  Discussed POC with patient  Re consult if needed, will not follow at this time. Thanks  Darrel Gloss [employer], RN,CWOCN, CNS, CWON-AP [phone])

## 2022-12-28 DIAGNOSIS — K5289 Other specified noninfective gastroenteritis and colitis: Secondary | ICD-10-CM | POA: Diagnosis not present

## 2022-12-28 DIAGNOSIS — Z515 Encounter for palliative care: Secondary | ICD-10-CM | POA: Diagnosis not present

## 2022-12-28 DIAGNOSIS — C3490 Malignant neoplasm of unspecified part of unspecified bronchus or lung: Secondary | ICD-10-CM

## 2022-12-28 DIAGNOSIS — F17200 Nicotine dependence, unspecified, uncomplicated: Secondary | ICD-10-CM

## 2022-12-28 DIAGNOSIS — Z66 Do not resuscitate: Secondary | ICD-10-CM | POA: Diagnosis not present

## 2022-12-28 DIAGNOSIS — R531 Weakness: Secondary | ICD-10-CM | POA: Diagnosis not present

## 2022-12-28 LAB — BASIC METABOLIC PANEL
Anion gap: 5 (ref 5–15)
BUN: 10 mg/dL (ref 8–23)
CO2: 31 mmol/L (ref 22–32)
Calcium: 7.8 mg/dL — ABNORMAL LOW (ref 8.9–10.3)
Chloride: 102 mmol/L (ref 98–111)
Creatinine, Ser: 0.96 mg/dL (ref 0.44–1.00)
GFR, Estimated: 59 mL/min — ABNORMAL LOW (ref >60)
Glucose, Bld: 155 mg/dL — ABNORMAL HIGH (ref 70–99)
Potassium: 4 mmol/L (ref 3.5–5.1)
Sodium: 138 mmol/L (ref 135–145)

## 2022-12-28 LAB — CBC
HCT: 24.1 % — ABNORMAL LOW (ref 36.0–46.0)
Hemoglobin: 7.5 g/dL — ABNORMAL LOW (ref 12.0–15.0)
MCH: 29.3 pg (ref 26.0–34.0)
MCHC: 31.1 g/dL (ref 30.0–36.0)
MCV: 94.1 fL (ref 80.0–100.0)
Platelets: 307 10*3/uL (ref 150–400)
RBC: 2.56 MIL/uL — ABNORMAL LOW (ref 3.87–5.11)
RDW: 14.3 % (ref 11.5–15.5)
WBC: 8 10*3/uL (ref 4.0–10.5)
nRBC: 0 % (ref 0.0–0.2)

## 2022-12-28 LAB — GLUCOSE, CAPILLARY
Glucose-Capillary: 193 mg/dL — ABNORMAL HIGH (ref 70–99)
Glucose-Capillary: 140 mg/dL — ABNORMAL HIGH (ref 70–99)
Glucose-Capillary: 149 mg/dL — ABNORMAL HIGH (ref 70–99)
Glucose-Capillary: 147 mg/dL — ABNORMAL HIGH (ref 70–99)

## 2022-12-28 MED ORDER — ATORVASTATIN CALCIUM 40 MG PO TABS
40.0000 mg | ORAL_TABLET | Freq: Every day | ORAL | Status: DC
  Administered 2022-12-28 – 2022-12-30 (×3): 40 mg via ORAL
  Filled 2022-12-28 (×3): qty 1

## 2022-12-28 NOTE — Progress Notes (Signed)
Patient ID: [name], female   DOB: 15-Apr-1941, 82 y.o.   MRN: [id number]    Progress Note from the Palliative Medicine Team at [facility]   Patient Name: [name]        Date: 12/28/2022 DOB: [birth year]  Age: 82 y.o. MRN#: [id number] Attending Physician: [place], Eric J, DO Primary Care Physician: System, Provider Not In Admit Date: 12/24/2022   Reason for Consultation/Follow-up   Establishing  goals of care   HPI/ Brief Hospital Review  82 year old female with history of chronic debility/nonambulatory, COPD on 2 L of oxygen at home, diabetes mellitus, cirrhosis, gastroparesis, hypertension, hyperlipidemia, lung cancer currently not in treatment,CVA who presents with not having bowel movement for 3 weeks, abdominal pain .   She lives in an apartment with her daughter.   CT abdomen/pelvis showed enlarging rectal stool ball with increased rectal wall thickening inflammatory changes concerning for stercoral colitis. Manual disimpaction was tried in the ED which was unsuccessful. Patient subsequently admitted. After giving smog enema,she started having bowel movements.   Due to her multiple comorbidities, poor quality of life and history of lung cancer, palliative care also consulted for goals of care   Patient and family face treatment option decisions, advanced directive decisions and anticipatory care needs.   Subjective  Extensive chart review has been completed prior to meeting with patient/family  including labs, vital signs, imaging, progress/consult notes, orders, medications and available advance directive documents.    This NP assessed patient at the bedside as a follow up for palliative medicine needs and emotional support, daughter at bedside.    Patient is alert and oriented.       Ongoing education regarding current medical situation.  Discussed patient's severe constipation and possible association with anemia, bleeding.  She has COPD,  refuses to use CPAP is on 2 L of oxygen at home.  Other comorbidities are CHF,  obesity, decreased mobility.   Daughter clearly verbalizes patient's desire that comfort and dignity be the focus of care.  Currently patient is under [facility] hospice services.  I spoke to them today and they confirmed.  Patient also has 2-1/2 hours a day from [facility] in- home care agency.   Time: 50   minutes  ---  discussed with   Detailed review of medical records ( labs, imaging, vital signs), medically appropriate exam ( MS, skin, cardia,  resp)   discussed with treatment team, counseling and education to patient, family, staff, documenting clinical information, medication management, coordination of care          Advanced Care Planning    #                         20 minutes    Introduced a MOST form completed to reflect a full comfort path   Plan of care -DNR DNI -No artificial feeding/hydration now or in the future -Avoid rehospitalization -No desire for diagnostics; colonoscopies biopsies -Discharge home with hospice services when medically stable       Explored patient's current hospice service..   Apparently she is with [facility] hosp[facility], I will reach out to them for further clarification   Education offered today regarding  the importance of continued conversation with family and their  medical providers regarding overall plan of care and treatment options,  ensuring decisions are within the context of the patients values and GOCs.  Questions and concerns addressed   Discussed with primary team and nursing staff  PMT will follow-up in the morning    Lorinda Creed NP  Palliative Medicine Team Team Phone # [phone] Pager [phone]

## 2022-12-28 NOTE — Progress Notes (Signed)
PROGRESS NOTE    [place]  UJW:[id number] DOB: 1941-04-03 DOA: 12/24/2022 PCP: System, Provider Not In    Brief Narrative:   [name] is a 82 y.o. female with past medical history significant for COPD on 2 L [place], type 2 diabetes mellitus, HTN, hyperlipidemia, lung cancer not currently on treatment, CVA, cirrhosis, gastroparesis, chronic debility/nonambulatory who presented to [facility] ED on 9/6 with abdominal pain associate with nausea and vomiting.  Apparently no bowel movement over the last 3 weeks.  Currently lives in apartment with her daughter.  CT abdomen/pelvis on admission showing enlarging rectal stool ball with increased rectal wall thickening, inflammatory changes consistent with stercoral colitis.  Manual disimpaction was attempted in the ED which was unsuccessful.  Patient was admitted to the Hospitalist service for further evaluation and management of severe constipation/stercoral colitis.  Assessment & Plan:   Severe constipation Stercoral colitis Patient presenting to the ED with progressive abdominal pain, no reported bowel movements x 3 weeks.  CT abdomen/pelvis notable for enlarging rectal stool ball with increased rectal wall thickening, inflammatory changes consistent with sterile coral colitis.  Unable to manage with manual disimpaction in the ED.  Patient was given smog enema with improvement of symptoms.  Seen by gastroenterology, rectal bleeding likely secondary to severe constipation and no need for further investigation with endoscopy at this time. -- Protonix 40 mg p.o. twice daily -- Senna 8.6 mg p.o. twice daily -- MiraLAX twice daily -- Holding aspirin and Plavix for now -- Strict I's and O's, monitor bowel movements  Iron deficiency anemia Hemoglobin 7.4 on admission trended down to 6.6.  Received 1 unit PRBC on 12/26/2022.  Anemia panel with iron 13, TIBC 207.  Patient received IV iron infusion x 2. -- Hgb 8.3>>6.6>7.6>7.5 -- Transfuse  for hemoglobin less than 7.0 -- Repeat H&H in the a.m.  Left leg/hip pain X-ray left hip with no acute fracture/dislocation.  Acute urinary retention -- Tamsulosin 0.4 mg p.o. daily -- Will attempt voiding trial today, DC Foley catheter and bladder scan and next void or if no void in 6 hours. -- Strict I's and O's  Chronic hypoxic respiratory failure on 2 L nasal cannula at baseline Obstructive sleep apnea Patient refuses to utilize CPAP machine at home per daughter.  Not in acute exacerbation. -- Anoro Ellipta 1 puff daily -- Continue supplemental oxygen, maintain SpO2 greater than 88%  Chronic diastolic congestive heart failure, compensated TTE 10/29/2022 with LVEF 55 to 60%, no regional LV wall motion normalities, mild LVH, LV diastolic function could not be evaluated, no aortic stenosis. -- Furosemide 40 mg p.o. daily  Hx epilepsy -- Keppra 5 bromides p.o. twice daily  Essential hypertension -- Amlodipine 5 mg p.o. daily -- Hydralazine 25 mg p.o. 3 times daily -- Furosemide 40 mg p.o. daily  Hyperlipidemia -- Atorvastatin 40 mg p.o. daily  Type 2 diabetes mellitus Home regimen includes Lantus 10 units [place] BID.  Hemoglobin A1c 6.7 on 10/29/2022, well-controlled. -- Holding home lantus for now -- CBGs qAC/HS  History of CVA Patient is nonambulatory and utilizes a wheelchair at baseline.  History of right upper lung cancer Follows with medical oncology outpatient, Dr. Arbutus Ped.  Has refused radiation/treatment in the past.  Continue outpatient follow-up.  Tobacco use disorder Counseled on need for complete cessation/abstinence. --Nicotine patch  Morbid obesity Body mass index is 46.15 kg/m.  Complicates all facets of care   DVT prophylaxis: Place and maintain sequential compression device Start: 12/26/22 0013 SCDs Start: 12/24/22  1445    Code Status: Do not attempt resuscitation (DNR) PRE-ARREST INTERVENTIONS DESIRED Family Communication:   Disposition Plan:   Level of care: Med-Surg Status is: Observation The patient remains OBS appropriate and will d/c before 2 midnights.    Consultants:  [facility] - signed off 9/10 Palliative care  Procedures:  None  Antimicrobials:  None   Subjective: Patient seen and examined at bedside, resting comfortably.  Lying in bed.  Reports abdominal pain much improved.  Reports bowel movement yesterday.  Will attempt voiding trial today.  No other questions or concerns at this time.  No family present at bedside.  Denies headache, no dizziness, no chest pain, no shortness of breath, no fever/chills/night sweats, no nausea/vomiting/diarrhea, no cough/congestion, no fatigue, no paresthesias.  No acute events overnight per nursing.  Objective: Vitals:   12/27/22 1601 12/27/22 2013 12/28/22 0632 12/28/22 0753  BP: (!) 120/42 (!) 128/48 (!) 134/50 136/61  Pulse: 71 73 67 69  Resp: 16 18 17 18   Temp: 99 F (37.2 C) (!) 97.5 F (36.4 C) 97.8 F (36.6 C) 99 F (37.2 C)  TempSrc: Oral Oral Oral   SpO2: 97% 93% 100% 94%  Weight:      Height:        Intake/Output Summary (Last 24 hours) at 12/28/2022 1131 Last data filed at 12/28/2022 0700 Gross per 24 hour  Intake --  Output 1350 ml  Net -1350 ml   Filed Weights   12/24/22 0728  Weight: 129.7 kg    Examination:  Physical Exam: GEN: NAD, alert and oriented x 3, obese, chronically ill in appearance HEENT: NCAT, PERRL, EOMI, sclera clear, MMM PULM: CTAB w/o wheezes/crackles, normal respiratory effort, on room air CV: RRR w/o M/G/R GI: abd soft, NTND, NABS, no R/G/M GU: Foley catheter noted with clear yellow urine in collection bag MSK: no peripheral edema, moves all EXTR independently NEURO: No focal neurological deficits PSYCH: normal mood/affect Integumentary: Sacral and bilateral heel wounds noted, otherwise no other concerning rashes/lesions/wounds nonexposed skin surfaces.      Data Reviewed: I have personally reviewed  following labs and imaging studies  CBC: Recent Labs  Lab 12/24/22 0815 12/25/22 0852 12/26/22 0111 12/26/22 0835 12/27/22 1101 12/28/22 0859  WBC 9.8 11.3*  --  9.2 9.4 8.0  NEUTROABS 8.0*  --   --   --   --   --   HGB 8.3* 7.4* 7.0* 6.6* 7.6* 7.5*  HCT 26.2* 24.0* 22.7* 21.7* 24.0* 24.1*  MCV 91.3 90.2  --  90.4 89.6 94.1  PLT 356 313  --  296 299 307   Basic Metabolic Panel: Recent Labs  Lab 12/24/22 0815 12/25/22 0852 12/26/22 0835 12/27/22 1101 12/28/22 0859  NA 136  --  139 135 138  K 3.8  --  3.9 3.7 4.0  CL 101  --  101 100 102  CO2 25  --  29 30 31   GLUCOSE 136*  --  97 107* 155*  BUN 9  --  10 10 10   CREATININE 0.86 0.79 0.80 0.89 0.96  CALCIUM 8.7*  --  8.3* 7.8* 7.8*   GFR: Estimated Creatinine Clearance: 63.5 mL/min (by C-G formula based on SCr of 0.96 mg/dL). Liver Function Tests: Recent Labs  Lab 12/24/22 0815  AST 17  ALT 13  ALKPHOS 88  BILITOT 0.4  PROT 6.8  ALBUMIN 3.1*   Recent Labs  Lab 12/24/22 0815  LIPASE 22   No results for input(s): "AMMONIA" in the  last 168 hours. Coagulation Profile: No results for input(s): "INR", "PROTIME" in the last 168 hours. Cardiac Enzymes: No results for input(s): "CKTOTAL", "CKMB", "CKMBINDEX", "TROPONINI" in the last 168 hours. BNP (last 3 results) No results for input(s): "PROBNP" in the last 8760 hours. HbA1C: No results for input(s): "HGBA1C" in the last 72 hours. CBG: Recent Labs  Lab 12/27/22 0749 12/27/22 1138 12/27/22 1602 12/27/22 2057 12/28/22 0635  GLUCAP 126* 104* 149* 187* 193*   Lipid Profile: No results for input(s): "CHOL", "HDL", "LDLCALC", "TRIG", "CHOLHDL", "LDLDIRECT" in the last 72 hours. Thyroid Function Tests: No results for input(s): "TSH", "T4TOTAL", "FREET4", "T3FREE", "THYROIDAB" in the last 72 hours. Anemia Panel: Recent Labs    12/26/22 0835  TIBC 207*  IRON 13*   Sepsis Labs: No results for input(s): "PROCALCITON", "LATICACIDVEN" in the last 168  hours.  No results found for this or any previous visit (from the past 240 hour(s)).       Radiology Studies: DG HIP UNILAT WITH PELVIS 1V LEFT  Result Date: 12/27/2022 CLINICAL DATA:  Left hip pain. EXAM: DG HIP (WITH OR WITHOUT PELVIS) 1V*L* COMPARISON:  Left hip radiograph dated 10/30/2022. FINDINGS: Evaluation is limited due to body habitus. There is no acute fracture or dislocation. The bones are osteopenic. Mild bilateral hip arthritic changes. Degenerative changes of the lower lumbar spine. The soft tissues unremarkable moderate stool throughout the visualized colon. IMPRESSION: 1. No acute fracture or dislocation. 2. Osteopenia and mild bilateral hip arthritic changes. Electronically Signed   By: Elgie Collard M.D.   On: 12/27/2022 17:19   DG Abd 1 View  Result Date: 12/27/2022 CLINICAL DATA:  [id number] Constipation [id number] EXAM: ABDOMEN - 1 VIEW COMPARISON:  CT AP, 12/24/2022.  KUB, 05/06/2015. FINDINGS: Mild air-distention of transverse colon. Nonobstructed bowel-gas pattern. No radio-opaque calculi or other significant radiographic abnormality are seen. IMPRESSION: Nonobstructed bowel-gas pattern. Electronically Signed   By: Roanna Banning M.D.   On: 12/27/2022 08:11        Scheduled Meds:  amLODipine  5 mg Oral Daily   furosemide  40 mg Oral Daily   gabapentin  300 mg Oral BID   hydrALAZINE  25 mg Oral Q8H   insulin aspart  0-24 Units Subcutaneous TID WC   levETIRAcetam  500 mg Oral BID   metoprolol tartrate  12.5 mg Oral BID   pantoprazole  40 mg Oral BID   polyethylene glycol  17 g Oral BID   senna  1 tablet Oral BID   tamsulosin  0.4 mg Oral Daily   umeclidinium-vilanterol  1 puff Inhalation Daily   Continuous Infusions:   LOS: 0 days    Time spent: 52 minutes spent on chart review, discussion with nursing staff, consultants, updating family and interview/physical exam; more than 50% of that time was spent in counseling and/or coordination of care.    Alvira Philips  [place], DO [facility] Available via Epic secure chat 7am-7pm After these hours, please refer to coverage provider listed on [email or web address] 12/28/2022, 11:31 AM

## 2022-12-28 NOTE — Progress Notes (Addendum)
Progress Note  Primary GI: Remote history of Dr. Juanda Chance DOA: 12/24/2022         Hospital Day: 5   Subjective  Chief Complaint: IDA with constipation and rectal bleeding   No family was present at the time of my evaluation. Palliative care was in the room talking with the patient as well. Patient lying in bed, denies any abdominal pain, denies any further bowel movements or rectal bleeding.  No nausea or vomiting. Patient states she has around the care help at home and a power wheelchair/wheelchair, has some complaints about care here, was complaining of leg pain and being cold/wet.    Objective   Vital signs in last 24 hours: Temp:  [97.5 F (36.4 C)-99 F (37.2 C)] 99 F (37.2 C) (09/10 0753) Pulse Rate:  [67-73] 69 (09/10 0753) Resp:  [16-18] 18 (09/10 0753) BP: (120-136)/(42-61) 136/61 (09/10 0753) SpO2:  [93 %-100 %] 94 % (09/10 0753) Last BM Date : 12/27/22 Last BM recorded by nurses in past 5 days Stool Type: Type 7 (Liquid consistency with no solid pieces) (12/27/2022  6:38 PM)  General: Obese AA female, chronically ill-appearing, NAD Heart:  Regular rate and rhythm; no murmurs Pulm: Clear anteriorly; no wheezing Abdomen:  Soft, Obese AB, Active bowel sounds. No tenderness  Psych:  Cooperative. Normal mood and affect.  Intake/Output from previous day: 09/09 0701 - 09/10 0700 In: -  Out: 2150 [Urine:2150] Intake/Output this shift: No intake/output data recorded.  Studies/Results: DG HIP UNILAT WITH PELVIS 1V LEFT  Result Date: 12/27/2022 CLINICAL DATA:  Left hip pain. EXAM: DG HIP (WITH OR WITHOUT PELVIS) 1V*L* COMPARISON:  Left hip radiograph dated 10/30/2022. FINDINGS: Evaluation is limited due to body habitus. There is no acute fracture or dislocation. The bones are osteopenic. Mild bilateral hip arthritic changes. Degenerative changes of the lower lumbar spine. The soft tissues unremarkable moderate stool throughout the visualized colon. IMPRESSION: 1. No acute  fracture or dislocation. 2. Osteopenia and mild bilateral hip arthritic changes. Electronically Signed   By: Elgie Collard M.D.   On: 12/27/2022 17:19   DG Abd 1 View  Result Date: 12/27/2022 CLINICAL DATA:  [id number] Constipation [id number] EXAM: ABDOMEN - 1 VIEW COMPARISON:  CT AP, 12/24/2022.  KUB, 05/06/2015. FINDINGS: Mild air-distention of transverse colon. Nonobstructed bowel-gas pattern. No radio-opaque calculi or other significant radiographic abnormality are seen. IMPRESSION: Nonobstructed bowel-gas pattern. Electronically Signed   By: Roanna Banning M.D.   On: 12/27/2022 08:11    Lab Results: Recent Labs    12/26/22 0835 12/27/22 1101 12/28/22 0859  WBC 9.2 9.4 8.0  HGB 6.6* 7.6* 7.5*  HCT 21.7* 24.0* 24.1*  PLT 296 299 307   BMET Recent Labs    12/26/22 0835 12/27/22 1101  NA 139 135  K 3.9 3.7  CL 101 100  CO2 29 30  GLUCOSE 97 107*  BUN 10 10  CREATININE 0.80 0.89  CALCIUM 8.3* 7.8*   LFT No results for input(s): "PROT", "ALBUMIN", "AST", "ALT", "ALKPHOS", "BILITOT", "BILIDIR", "IBILI" in the last 72 hours. PT/INR No results for input(s): "LABPROT", "INR" in the last 72 hours.   Scheduled Meds:  amLODipine  5 mg Oral Daily   furosemide  40 mg Oral Daily   gabapentin  300 mg Oral BID   hydrALAZINE  25 mg Oral Q8H   insulin aspart  0-24 Units Subcutaneous TID WC   levETIRAcetam  500 mg Oral BID   metoprolol tartrate  12.5 mg Oral BID  pantoprazole  40 mg Oral BID   polyethylene glycol  17 g Oral BID   senna  1 tablet Oral BID   tamsulosin  0.4 mg Oral Daily   umeclidinium-vilanterol  1 puff Inhalation Daily   Continuous Infusions:     Impression/Plan:   IDA with rectal bleeding 12/26/2022 Iron 13, received IV iron 09/08 and 09/09 Initially CTAP identified enlarging rectal stool ball with increased rectal wall thickening with inflammatory changes and sacral edema concerning for stercoral colitis Hemoglobin 7.4--> 7.0--> 6.6--> 1 unit  PRBCs--7.6-->7.5 Patient's not had any further bloody bowel movements, no bowel movements since yesterday evening.  Currently at this point appears patient's acute bleeding has stopped, hemoglobin stable, patient currently being followed outpatient by hospice, palliative care following here. No plans for endoscopic evaluation at this time unless patient has further rectal bleeding or continuing worsening anemia Most likely rectal bleeding was secondary to sterile coral colitis/ulcer, continue aggressive bowel regiment inpatient as well as outpatient.  Patient currently on MiraLAX twice daily, Senokot twice daily, enemas as needed. Continue to monitor hemoglobin transfuse greater than 7 GI will sign off right now, please call us back this hospital visit if patient has any further anemia or rectal bleeding.  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 I performed a substantive portion of this encounter, including complete performance of at least one of the key components, in conjunction with the APP. I agree with the APP's note, impression and recommendations.    No further bleeding.  Hemoglobin has remained stable Continue bowel regimen to prevent constipation  GI will sign off, available if have any questions     K. Scherry Ran , MD [phone]

## 2022-12-28 NOTE — Plan of Care (Signed)

## 2022-12-28 NOTE — Plan of Care (Signed)
Problem: Education: Goal: Knowledge of General Education information will improve Description: Including pain rating scale, medication(s)/side effects and non-pharmacologic comfort measures Outcome: Progressing Pt has AMS and does not understands why she was admitted into the hospital.  She was admitted for constipation which she had endorsed for 3 weeks.  While here in the hospital she was found to be anemic and need to be transfused with 2 units of PRBCs.  Her anemia is d/t stercoral colitis.    Problem: Clinical Measurements: Goal: Ability to maintain clinical measurements within normal limits will improve Outcome: Progressing Pt has been hypertensive this shift.  She has antihypertensive ordered per MD.  BP throughout the day improved.    Problem: Clinical Measurements: Goal: Will remain free from infection Outcome: Progressing S/Sx of infection monitored and assessed q8 hours.  Pt has remained afebrile thus far.  Pt has wounds to bilateral heels and a foley catheter.  Manangement of wounds and dressing performed per MD's orders.  She has a foley catheter in place to capture her urine.  Management of foley catheter performed per [facility] policy, procedure and guidelines.         Problem: Clinical Measurements: Goal: Respiratory complications will improve Outcome: Progressing Respiratory status monitored and assessed q-shift.  Pt is on room air with PO2 at 94% and respiration rate of 18 breaths per minute.  Pt hs not endorsed c/o SOB or DOE.    Problem: Clinical Measurements: Goal: Cardiovascular complication will be avoided Outcome: Progressing Pt has been hypertensive this shift.  She has antihypertensive ordered per MD.  BP throughout the day improved.    Problem: Activity: Goal: Risk for activity intolerance will decrease Outcome: Progressing Pt is unable to perform ADLs independently.  She needs the assistance of RN staff to help reposition and move her in the bed.    Problem:  Nutrition: Goal: Adequate nutrition will be maintained Outcome: Progressing Pt is on a heart healthy diet.  She has been able to tolerate her diet w/o s/sx of abdominal pain/ distention or n/v.    Problem: Coping: Goal: Level of anxiety will decrease Outcome: Progressing Pt states she is anxious about her hospitalization and questions why she is not yet feeling better.  MD and RN staff offered support and presence to pt as needed.  Reassured pt and family that medical staff are doing all they can to assess the situation.     Problem: Safety: Goal: Ability to remain free from injury will improve Outcome: Progressing Pt has remained free from falls thus far.  Instructed pt to utilize RN call light for assistance.  Hourly rounds performed.  Bed alarm implemented to keep pt safe from falls.  Settings activated to third most sensitive mode.  Bed in lowest position, locked with two upper side rails engaged.  Belongings and call light within reach.    Problem: Skin Integrity: Goal: Risk for impaired skin integrity will decrease Outcome: Progressing Skin integrity monitored and assessed q-shift.  Instructed pt to turn q2 hours to prevent further skin impairment.  Tubes and drains assessed for device related pressure sores.  Pt is incontinent of both bowel and bladder.   She is checked q2 hours for boowel incontinence.  Perineal care given promptly after each episode.  Pt has a foley female catheter in place to capture her urine.  Management of foley catheter performed per [facility] policy, procedure and guidelines.  Dressing changes performed per MD's orders.

## 2022-12-29 ENCOUNTER — Observation Stay (HOSPITAL_COMMUNITY): Payer: Medicare Other

## 2022-12-29 DIAGNOSIS — L89610 Pressure ulcer of right heel, unstageable: Secondary | ICD-10-CM | POA: Diagnosis present

## 2022-12-29 DIAGNOSIS — K7460 Unspecified cirrhosis of liver: Secondary | ICD-10-CM | POA: Diagnosis present

## 2022-12-29 DIAGNOSIS — D62 Acute posthemorrhagic anemia: Secondary | ICD-10-CM | POA: Diagnosis not present

## 2022-12-29 DIAGNOSIS — Z66 Do not resuscitate: Secondary | ICD-10-CM | POA: Diagnosis present

## 2022-12-29 DIAGNOSIS — G40909 Epilepsy, unspecified, not intractable, without status epilepticus: Secondary | ICD-10-CM | POA: Diagnosis present

## 2022-12-29 DIAGNOSIS — I5032 Chronic diastolic (congestive) heart failure: Secondary | ICD-10-CM | POA: Diagnosis present

## 2022-12-29 DIAGNOSIS — D509 Iron deficiency anemia, unspecified: Secondary | ICD-10-CM | POA: Diagnosis present

## 2022-12-29 DIAGNOSIS — L89159 Pressure ulcer of sacral region, unspecified stage: Secondary | ICD-10-CM | POA: Diagnosis present

## 2022-12-29 DIAGNOSIS — I110 Hypertensive heart disease with heart failure: Secondary | ICD-10-CM | POA: Diagnosis present

## 2022-12-29 DIAGNOSIS — K3184 Gastroparesis: Secondary | ICD-10-CM | POA: Diagnosis present

## 2022-12-29 DIAGNOSIS — C3490 Malignant neoplasm of unspecified part of unspecified bronchus or lung: Secondary | ICD-10-CM | POA: Diagnosis present

## 2022-12-29 DIAGNOSIS — J9611 Chronic respiratory failure with hypoxia: Secondary | ICD-10-CM | POA: Diagnosis present

## 2022-12-29 DIAGNOSIS — K5289 Other specified noninfective gastroenteritis and colitis: Secondary | ICD-10-CM | POA: Diagnosis present

## 2022-12-29 DIAGNOSIS — E1143 Type 2 diabetes mellitus with diabetic autonomic (poly)neuropathy: Secondary | ICD-10-CM | POA: Diagnosis present

## 2022-12-29 DIAGNOSIS — I69351 Hemiplegia and hemiparesis following cerebral infarction affecting right dominant side: Secondary | ICD-10-CM | POA: Diagnosis not present

## 2022-12-29 DIAGNOSIS — Z515 Encounter for palliative care: Secondary | ICD-10-CM | POA: Diagnosis not present

## 2022-12-29 DIAGNOSIS — J449 Chronic obstructive pulmonary disease, unspecified: Secondary | ICD-10-CM | POA: Diagnosis present

## 2022-12-29 LAB — GLUCOSE, CAPILLARY
Glucose-Capillary: 120 mg/dL — ABNORMAL HIGH (ref 70–99)
Glucose-Capillary: 118 mg/dL — ABNORMAL HIGH (ref 70–99)
Glucose-Capillary: 167 mg/dL — ABNORMAL HIGH (ref 70–99)
Glucose-Capillary: 164 mg/dL — ABNORMAL HIGH (ref 70–99)
Glucose-Capillary: 143 mg/dL — ABNORMAL HIGH (ref 70–99)

## 2022-12-29 MED ORDER — SENNA 8.6 MG PO TABS
2.0000 | ORAL_TABLET | Freq: Two times a day (BID) | ORAL | Status: DC
  Administered 2022-12-29 – 2022-12-30 (×3): 17.2 mg via ORAL
  Filled 2022-12-29 (×3): qty 2

## 2022-12-29 MED ORDER — SORBITOL 70 % SOLN
960.0000 mL | TOPICAL_OIL | Freq: Once | ORAL | Status: DC
  Filled 2022-12-29: qty 240

## 2022-12-29 NOTE — Plan of Care (Signed)
blem: Education: Goal: Knowledge of General Education information will improve Description: Including pain rating scale, medication(s)/side effects and non-pharmacologic comfort measures Outcome: Progressing Pt has AMS and does not understands why she was admitted into the hospital.  She was admitted for constipation which she had endorsed for 3 weeks.  While here in the hospital she was found to be anemic and need to be transfused with 2 units of PRBCs.  Her anemia is d/t stercoral colitis.    Problem: Clinical Measurements: Goal: Ability to maintain clinical measurements within normal limits will improve Outcome: Progressing Pt VS WNL this shift except for her temp.  She t-max at 100.79F.  [place], MD notified via secure chat.    Problem: Clinical Measurements: Goal: Will remain free from infection Outcome: Progressing S/Sx of infection monitored and assessed q8 hours.  Pt t-max at 100.79F.  Pt has wounds to bilateral heels.  Manangement of wounds and dressing performed per MD's orders.  She has an external female catheter (purewick)  in place to capture her urine.  Management of external female catheter (purewick) performed per [facility] policy, procedure and guidelines.         Problem: Clinical Measurements: Goal: Respiratory complications will improve Outcome: Progressing Respiratory status monitored and assessed q-shift.  Pt is on room air with PO2 at 94-96% and respiration rate of 18-20 breaths per minute.  Pt hs not endorsed c/o SOB or DOE.    Problem: Clinical Measurements: Goal: Cardiovascular complication will be avoided Outcome: Progressing Pt VS WNL this shift except for her temp.  She t-max at 100.79F.  [place], MD notified via secure chat.    Problem: Activity: Goal: Risk for activity intolerance will decrease Outcome: Progressing Pt is unable to perform ADLs independently.  She needs the assistance of RN staff to help reposition and move her in the bed.    Problem:  Nutrition: Goal: Adequate nutrition will be maintained Outcome: Progressing Pt is on a heart healthy diet.  She has been able to tolerate her diet w/o s/sx of abdominal pain/ distention or n/v.    Problem: Coping: Goal: Level of anxiety will decrease Outcome: Progressing Pt states she is anxious about her hospitalization and questions why she is not yet feeling better or being d/c home.  MD and RN staff offered support and presence to pt as needed.  Reassured pt that medical staff are doing all they can to assess the situation.     Problem: Safety: Goal: Ability to remain free from injury will improve Outcome: Progressing Pt has remained free from falls thus far.  Instructed pt to utilize RN call light for assistance.  Hourly rounds performed.  Bed alarm implemented to keep pt safe from falls.  Settings activated to third most sensitive mode.  Bed in lowest position, locked with two upper side rails engaged.  Belongings and call light within reach.    Problem: Skin Integrity: Goal: Risk for impaired skin integrity will decrease Outcome: Progressing Skin integrity monitored and assessed q-shift.  Instructed pt to turn q2 hours to prevent further skin impairment.  Tubes and drains assessed for device related pressure sores.  Pt is incontinent of both bowel and bladder.   She is checked q2 hours for boowel incontinence.  Perineal care given promptly after each episode.  Pt has an external female catheter (purewick)  in place to capture her urine.  Management of external female catheter (purewick) performed per [facility] policy, procedure and guidelines.  Dressing changes performed per MD's orders.

## 2022-12-29 NOTE — Progress Notes (Signed)
Transition of Care [facility]) - Inpatient Brief Assessment   Patient Details  Name: [name] MRN: [id number] Date of Birth: November 29, 1940  Transition of Care [facility]) CM/SW Contact:    Janae Bridgeman, RN Phone Number: 12/29/2022, 3:25 PM   Clinical Narrative: Patient admitted from home with severe constipation.  Patient is active with [facility] and [facility] services.  No TOC needs at this time.   Transition of Care Asessment: Insurance and Status: Insurance coverage has been reviewed Patient has primary care physician: (P) Yes Fatima Sanger, NP) Home environment has been reviewed: (P) From home with daughter Prior level of function:: (P) Hospice patient with [facility] Prior/Current Home Services: (P) Current home services [facility], [facility]) Social Determinants of Health Reivew: (P) SDOH reviewed no interventions necessary Readmission risk has been reviewed: (P) Yes Transition of care needs: (P) no transition of care needs at this time

## 2022-12-29 NOTE — Progress Notes (Signed)
PROGRESS NOTE    [place]  ZOX:[id number] DOB: 12-19-1940 DOA: 12/24/2022 PCP: System, Provider Not In    Brief Narrative:   [name] is a 82 y.o. female with past medical history significant for COPD on 2 L [place], type 2 diabetes mellitus, HTN, hyperlipidemia, lung cancer not currently on treatment, CVA, cirrhosis, gastroparesis, chronic debility/nonambulatory who presented to [facility] ED on 9/6 with abdominal pain associate with nausea and vomiting.  Apparently no bowel movement over the last 3 weeks.  Currently lives in apartment with her daughter.  CT abdomen/pelvis on admission showing enlarging rectal stool ball with increased rectal wall thickening, inflammatory changes consistent with stercoral colitis.  Manual disimpaction was attempted in the ED which was unsuccessful.  Patient was admitted to the Hospitalist service for further evaluation and management of severe constipation/stercoral colitis.  Assessment & Plan:   Severe constipation Stercoral colitis Patient presenting to the ED with progressive abdominal pain, no reported bowel movements x 3 weeks.  CT abdomen/pelvis notable for enlarging rectal stool ball with increased rectal wall thickening, inflammatory changes consistent with sterile coral colitis.  Unable to manage with manual disimpaction in the ED.  Patient was given smog enema with improvement of symptoms.  Seen by gastroenterology, rectal bleeding likely secondary to severe constipation and no need for further investigation with endoscopy at this time. -- Protonix 40 mg p.o. twice daily -- Senna 8.6 mg p.o. 2 tabs twice daily -- MiraLAX twice daily -- repeat SMOG enema today -- Holding aspirin and Plavix for now -- Strict I's and O's, monitor bowel movements  Iron deficiency anemia Hemoglobin 7.4 on admission trended down to 6.6.  Received 1 unit PRBC on 12/26/2022.  Anemia panel with iron 13, TIBC 207.  Patient received IV iron infusion x 2. --  Hgb 8.3>>6.6>7.6>7.5> labs pending this morning -- Transfuse for hemoglobin less than 7.0 -- Repeat H&H in the a.m.  Left leg/hip pain X-ray left hip with no acute fracture/dislocation.  Acute urinary retention -- Tamsulosin 0.4 mg p.o. daily -- Foley catheter removed on 9/10 -- Bladder scan as needed -- Strict I's and O's  Chronic hypoxic respiratory failure on 2 L nasal cannula at baseline Obstructive sleep apnea Patient refuses to utilize CPAP machine at home per daughter.  Not in acute exacerbation. -- Anoro Ellipta 1 puff daily -- Continue supplemental oxygen, maintain SpO2 greater than 88%  Chronic diastolic congestive heart failure, compensated TTE 10/29/2022 with LVEF 55 to 60%, no regional LV wall motion normalities, mild LVH, LV diastolic function could not be evaluated, no aortic stenosis. -- Furosemide 40 mg p.o. daily  Hx epilepsy -- Keppra 5 bromides p.o. twice daily  Essential hypertension -- Amlodipine 5 mg p.o. daily -- Hydralazine 25 mg p.o. 3 times daily -- Furosemide 40 mg p.o. daily  Hyperlipidemia -- Atorvastatin 40 mg p.o. daily  Type 2 diabetes mellitus Home regimen includes Lantus 10 units [place] BID.  Hemoglobin A1c 6.7 on 10/29/2022, well-controlled. -- Holding home lantus for now -- CBGs qAC/HS  History of CVA Patient is nonambulatory and utilizes a wheelchair at baseline.  History of right upper lung cancer Follows with medical oncology outpatient, Dr. Arbutus Ped.  Has refused radiation/treatment in the past.  Continue outpatient follow-up. On Home hospice.  Tobacco use disorder Counseled on need for complete cessation/abstinence. --Nicotine patch  Pressure injury bilateral heels, POA Pressure Injury 12/27/22 Heel Left;Lateral Unstageable - Full thickness tissue loss in which the base of the injury is covered by slough (  yellow, tan, gray, green or brown) and/or eschar (tan, brown or black) in the wound bed. (Active)  12/27/22 0904  Location:  Heel  Location Orientation: Left;Lateral  Staging: Unstageable - Full thickness tissue loss in which the base of the injury is covered by slough (yellow, tan, gray, green or brown) and/or eschar (tan, brown or black) in the wound bed.  Wound Description (Comments):   Present on Admission: Yes     Pressure Injury 12/27/22 Heel Right;Medial Unstageable - Full thickness tissue loss in which the base of the injury is covered by slough (yellow, tan, gray, green or brown) and/or eschar (tan, brown or black) in the wound bed. (Active)  12/27/22 0904  Location: Heel  Location Orientation: Right;Medial  Staging: Unstageable - Full thickness tissue loss in which the base of the injury is covered by slough (yellow, tan, gray, green or brown) and/or eschar (tan, brown or black) in the wound bed.  Wound Description (Comments):   Present on Admission: Yes  Seen by wound care RN. Add Prevalon offloading boots bilaterally, offload heels as much as possible. Cleanse bilateral heel wounds with saline, pat dry. Apply single layer of xeroform to the right medial wound, paint left lateral heel with betadine, cover with foam. Change xeroform and reapply betadine daily. Ok to lift foam and change every 3 days.    Morbid obesity Body mass index is 46.15 kg/m.  Complicates all facets of care   DVT prophylaxis: Place and maintain sequential compression device Start: 12/26/22 0013 SCDs Start: 12/24/22 1445    Code Status: Do not attempt resuscitation (DNR) PRE-ARREST INTERVENTIONS DESIRED Family Communication:   Disposition Plan:  Level of care: Med-Surg Status is: Observation The patient remains OBS appropriate and will d/c before 2 midnights.    Consultants:   gastroenterology - signed off 9/10 Palliative care  Procedures:  None  Antimicrobials:  None   Subjective: Patient seen and examined at bedside, lying in bed.  Complaining of left leg pain.  Continues to report abdominal pain  improved but no bowel movement past 24 hours.  Discussed will repeat enema today and increased senna dose.  Foley catheter removed last night.  Discussed with RN to assess bladder scan today. No other questions or concerns at this time.  No family present at bedside.  Denies headache, no dizziness, no chest pain, no shortness of breath, no fever/chills/night sweats, no nausea/vomiting/diarrhea, no cough/congestion, no fatigue, no paresthesias.  No acute events overnight per nursing.  Objective: Vitals:   12/28/22 2048 12/29/22 0601 12/29/22 0745 12/29/22 0837  BP: (!) 130/50 (!) 143/48 (!) 120/50   Pulse: 60 62 (!) 59   Resp: 17 20 18    Temp: 98.8 F (37.1 C) 98.7 F (37.1 C) 98.3 F (36.8 C)   TempSrc:  Oral    SpO2: 92% 97% 96% 96%  Weight:      Height:        Intake/Output Summary (Last 24 hours) at 12/29/2022 0859 Last data filed at 12/28/2022 2100 Gross per 24 hour  Intake 240 ml  Output 1475 ml  Net -1235 ml   Filed Weights   12/24/22 0728  Weight: 129.7 kg    Examination:  Physical Exam: GEN: NAD, alert and oriented x 3, obese, chronically ill in appearance HEENT: NCAT, PERRL, EOMI, sclera clear, MMM PULM: CTAB w/o wheezes/crackles, normal respiratory effort, on room air CV: RRR w/o M/G/R GI: abd soft, NTND, NABS, no R/G/M MSK: no peripheral edema, moves all extremities independently NEURO:  No focal neurological deficits PSYCH: normal mood/affect Integumentary: Sacral and bilateral heel wounds noted, otherwise no other concerning rashes/lesions/wounds nonexposed skin surfaces.      Data Reviewed: I have personally reviewed following labs and imaging studies  CBC: Recent Labs  Lab 12/24/22 0815 12/25/22 0852 12/26/22 0111 12/26/22 0835 12/27/22 1101 12/28/22 0859  WBC 9.8 11.3*  --  9.2 9.4 8.0  NEUTROABS 8.0*  --   --   --   --   --   HGB 8.3* 7.4* 7.0* 6.6* 7.6* 7.5*  HCT 26.2* 24.0* 22.7* 21.7* 24.0* 24.1*  MCV 91.3 90.2  --  90.4 89.6 94.1  PLT 356  313  --  296 299 307   Basic Metabolic Panel: Recent Labs  Lab 12/24/22 0815 12/25/22 0852 12/26/22 0835 12/27/22 1101 12/28/22 0859  NA 136  --  139 135 138  K 3.8  --  3.9 3.7 4.0  CL 101  --  101 100 102  CO2 25  --  29 30 31   GLUCOSE 136*  --  97 107* 155*  BUN 9  --  10 10 10   CREATININE 0.86 0.79 0.80 0.89 0.96  CALCIUM 8.7*  --  8.3* 7.8* 7.8*   GFR: Estimated Creatinine Clearance: 63.5 mL/min (by C-G formula based on SCr of 0.96 mg/dL). Liver Function Tests: Recent Labs  Lab 12/24/22 0815  AST 17  ALT 13  ALKPHOS 88  BILITOT 0.4  PROT 6.8  ALBUMIN 3.1*   Recent Labs  Lab 12/24/22 0815  LIPASE 22   No results for input(s): "AMMONIA" in the last 168 hours. Coagulation Profile: No results for input(s): "INR", "PROTIME" in the last 168 hours. Cardiac Enzymes: No results for input(s): "CKTOTAL", "CKMB", "CKMBINDEX", "TROPONINI" in the last 168 hours. BNP (last 3 results) No results for input(s): "PROBNP" in the last 8760 hours. HbA1C: No results for input(s): "HGBA1C" in the last 72 hours. CBG: Recent Labs  Lab 12/28/22 1238 12/28/22 1659 12/28/22 2051 12/29/22 0644 12/29/22 0745  GLUCAP 140* 149* 147* 120* 118*   Lipid Profile: No results for input(s): "CHOL", "HDL", "LDLCALC", "TRIG", "CHOLHDL", "LDLDIRECT" in the last 72 hours. Thyroid Function Tests: No results for input(s): "TSH", "T4TOTAL", "FREET4", "T3FREE", "THYROIDAB" in the last 72 hours. Anemia Panel: No results for input(s): "VITAMINB12", "FOLATE", "FERRITIN", "TIBC", "IRON", "RETICCTPCT" in the last 72 hours.  Sepsis Labs: No results for input(s): "PROCALCITON", "LATICACIDVEN" in the last 168 hours.  No results found for this or any previous visit (from the past 240 hour(s)).       Radiology Studies: DG HIP UNILAT WITH PELVIS 1V LEFT  Result Date: 12/27/2022 CLINICAL DATA:  Left hip pain. EXAM: DG HIP (WITH OR WITHOUT PELVIS) 1V*L* COMPARISON:  Left hip radiograph dated  10/30/2022. FINDINGS: Evaluation is limited due to body habitus. There is no acute fracture or dislocation. The bones are osteopenic. Mild bilateral hip arthritic changes. Degenerative changes of the lower lumbar spine. The soft tissues unremarkable moderate stool throughout the visualized colon. IMPRESSION: 1. No acute fracture or dislocation. 2. Osteopenia and mild bilateral hip arthritic changes. Electronically Signed   By: Elgie Collard M.D.   On: 12/27/2022 17:19        Scheduled Meds:  amLODipine  5 mg Oral Daily   atorvastatin  40 mg Oral Daily   furosemide  40 mg Oral Daily   gabapentin  300 mg Oral BID   hydrALAZINE  25 mg Oral Q8H   insulin aspart  0-24 Units  Subcutaneous TID WC   levETIRAcetam  500 mg Oral BID   metoprolol tartrate  12.5 mg Oral BID   pantoprazole  40 mg Oral BID   polyethylene glycol  17 g Oral BID   senna  2 tablet Oral BID   sorbitol, milk of mag, mineral oil, glycerin (SMOG) enema  960 mL Rectal Once   tamsulosin  0.4 mg Oral Daily   umeclidinium-vilanterol  1 puff Inhalation Daily   Continuous Infusions:   LOS: 0 days    Time spent: 51 minutes spent on chart review, discussion with nursing staff, consultants, updating family and interview/physical exam; more than 50% of that time was spent in counseling and/or coordination of care.    Alvira Philips [place], DO [facility] Available via Epic secure chat 7am-7pm After these hours, please refer to coverage provider listed on [email or web address] 12/29/2022, 8:59 AM

## 2022-12-29 NOTE — Progress Notes (Signed)
This chaplain responded to PMT consult for creating/updating the Pt. Advance Directive: HCPOA. The Pt. has eaten a few bites of spaghetti before the chaplain arrived and drank a few swallows of milk during the chaplain's visit.  The Pt. answered the chaplain's clarifying questions after HCPOA education. The Pt. indicates her choice for HCPOA is [employer]. The chaplain understands the Pt. is unsure if the Pt. son-AJ has POA or HCPOA. The chaplain listened reflectively as the Pt. shared her uncertainty of AJ awareness of the Pt. hospitalization. The chaplain will connect with RN-Lila and PMT NP-Mary for an update on communication with the Pt. son before proceeding with notarizing the Pt. HCPOA. .  This chaplain is available for F/U spiritual care as needed.  Chaplain Stephanie Acre [phone]

## 2022-12-30 ENCOUNTER — Ambulatory Visit (HOSPITAL_COMMUNITY): Payer: Medicare Other

## 2022-12-30 ENCOUNTER — Ambulatory Visit: Payer: Medicare PPO

## 2022-12-30 DIAGNOSIS — K5289 Other specified noninfective gastroenteritis and colitis: Secondary | ICD-10-CM | POA: Diagnosis not present

## 2022-12-30 LAB — GLUCOSE, CAPILLARY
Glucose-Capillary: 130 mg/dL — ABNORMAL HIGH (ref 70–99)
Glucose-Capillary: 173 mg/dL — ABNORMAL HIGH (ref 70–99)

## 2022-12-30 MED ORDER — CLOPIDOGREL BISULFATE 75 MG PO TABS: 75.0000 mg | ORAL_TABLET | Freq: Every day | ORAL | Status: DC

## 2022-12-30 MED ORDER — PANTOPRAZOLE SODIUM 40 MG PO TBEC: 40.0000 mg | DELAYED_RELEASE_TABLET | Freq: Two times a day (BID) | ORAL | 0 refills | Status: DC

## 2022-12-30 MED ORDER — ASPIRIN EC 81 MG PO TBEC: 81.0000 mg | DELAYED_RELEASE_TABLET | Freq: Every day | ORAL | Status: DC

## 2022-12-30 MED ORDER — TAMSULOSIN HCL 0.4 MG PO CAPS: 0.4000 mg | ORAL_CAPSULE | Freq: Every day | ORAL | 0 refills | Status: AC

## 2022-12-30 MED ORDER — POLYETHYLENE GLYCOL 3350 17 G PO PACK: 17.0000 g | PACK | Freq: Two times a day (BID) | ORAL | 0 refills | Status: DC

## 2022-12-30 MED ORDER — SENNA 8.6 MG PO TABS: 2.0000 | ORAL_TABLET | Freq: Two times a day (BID) | ORAL | 0 refills | Status: DC

## 2022-12-30 NOTE — TOC Transition Note (Signed)
Transition of Care [facility]) - CM/SW Discharge Note   Patient Details  Name: [name] MRN: [id number] Date of Birth: 07/17/1940  Transition of [facility] CM/SW Contact:  Janae Bridgeman, RN Phone Number: 12/30/2022, 10:45 AM   Clinical Narrative:    CM spoke with bedside nursing this morning and the patient is medically stable for discharge to home today.  The patient lives at her home and is followed by [facility].  I spoke with the patient at the bedside and she remains on room air and is bed bound and agreeable to go home by PTAr transport.  I called the patient's daughter, Belinda Block, by phone and she is aware that I am calling PTAR now and she will be available at the apartment to assist.  [name] was called for transport to home.  PTAr packet is available at secretary's desk including the DNR.  I called Trena Platt, CM with [facility] and she is aware that patient is discharging today and will restart services.  No other TOC needs.  Bedside nursing to provide instructions to the patient/family when discharged.         Patient Goals and CMS Choice      Discharge Placement                         Discharge Plan and Services Additional resources added to the After Visit Summary for                                       Social Determinants of Health (SDOH) Interventions SDOH Screenings   Food Insecurity: No Food Insecurity (10/31/2022)  Housing: Low Risk  (10/31/2022)  Transportation Needs: No Transportation Needs (10/31/2022)  Utilities: Not At Risk (10/31/2022)  Alcohol Screen: Low Risk  (06/07/2022)  Physical Activity: Inactive (06/07/2022)  Tobacco Use: High Risk (12/24/2022)     Readmission Risk Interventions    12/29/2022    3:15 PM 11/02/2022    2:16 PM  Readmission Risk Prevention Plan  Transportation Screening Complete Complete  PCP or Specialist Appt within 5-7 Days  Complete  PCP or Specialist Appt  within 3-5 Days Complete   Home Care Screening  Complete  Medication Review (RN CM)  Complete  HRI or Home Care Consult Complete   Social Work Consult for Recovery Care Planning/Counseling Complete   Palliative Care Screening Complete   Medication Review Oceanographer) Complete

## 2022-12-30 NOTE — Discharge Summary (Signed)
Physician Discharge Summary  [name] HYQ:[id number] DOB: May 23, 1940 DOA: 12/24/2022  PCP: System, Provider Not In  Admit date: 12/24/2022 Discharge date: 12/30/2022  Admitted From: Home Disposition: Home with resumption of hospice  Recommendations for Outpatient Follow-up:  Follow up with PCP in 1-2 weeks Started on senna 2 tablets p.o. twice daily and MiraLAX twice daily, Protonix for severe constipation, sterile coral colitis Also started on tamsulosin for urinary retention during hospitalization Recommend repeat CBC 1 week to reassess hemoglobin level  Discharge Condition: Stable CODE STATUS: DNR Diet recommendation: Heart healthy/consistent carbohydrate diet  History of present illness:  [name] is a 82 y.o. female with past medical history significant for COPD on 2 L [place], type 2 diabetes mellitus, HTN, hyperlipidemia, lung cancer not currently on treatment, CVA, cirrhosis, gastroparesis, chronic debility/nonambulatory who presented to [facility] ED on 9/6 with abdominal pain associate with nausea and vomiting.  Apparently no bowel movement over the last 3 weeks.  Currently lives in apartment with her daughter.  CT abdomen/pelvis on admission showing enlarging rectal stool ball with increased rectal wall thickening, inflammatory changes consistent with stercoral colitis.  Manual disimpaction was attempted in the ED which was unsuccessful.  Patient was admitted to the Hospitalist service for further evaluation and management of severe constipation/stercoral colitis.   Hospital course:  Severe constipation Stercoral colitis Patient presenting to the ED with progressive abdominal pain, no reported bowel movements x 3 weeks.  CT abdomen/pelvis notable for enlarging rectal stool ball with increased rectal wall thickening, inflammatory changes consistent with sterile coral colitis.  Unable to manage with manual disimpaction in the ED.  Patient was given smog enema with  improvement of symptoms.  Seen by gastroenterology, rectal bleeding likely secondary to severe constipation and no need for further investigation with endoscopy at this time.  Patient was started on aggressive bowel regimen including enema, MiraLAX twice daily and senna 2 tabs twice daily with improvement/resolution of symptoms and abdominal pain.  Continue MiraLAX twice daily and senna 2 tabs twice daily.  Started on Protonix 40 mg p.o. twice daily.  Continue to hold aspirin and Plavix for 1 week and restart.  Outpatient follow-up with PCP, recommend repeat CBC 1 week.   Iron deficiency anemia Hemoglobin 7.4 on admission trended down to 6.6.  Received 1 unit PRBC on 12/26/2022.  Anemia panel with iron 13, TIBC 207.  Patient received IV iron infusion x 2.  Recommend repeat CBC 1 week.   Left leg/hip pain X-ray left hip with no acute fracture/dislocation.   Acute urinary retention Foley catheter removed on 9/10 with no further issues regarding voiding.  Started on tamsulosin 0.4 mg p.o. daily   Chronic hypoxic respiratory failure on 2 L nasal cannula at baseline Obstructive sleep apnea Patient refuses to utilize CPAP machine at home per daughter.  Not in acute exacerbation.   Chronic diastolic congestive heart failure, compensated TTE 10/29/2022 with LVEF 55 to 60%, no regional LV wall motion normalities, mild LVH, LV diastolic function could not be evaluated, no aortic stenosis. Furosemide 40 mg p.o. daily   Hx epilepsy Keppra 500 mg p.o. twice daily   Essential hypertension Amlodipine 5 mg p.o. daily, Hydralazine 25 mg p.o. 3 times daily, Furosemide 40 mg p.o. daily   Hyperlipidemia Atorvastatin 40 mg p.o. daily  Morbid obesity Body mass index is 44.8 kg/m.  Complicates all facets of care  Discharge Diagnoses:  Principal Problem:   Stercoral colitis Active Problems:   Hematochezia   Acute on chronic  anemia    Discharge Instructions  Discharge Instructions     Call MD for:   difficulty breathing, headache or visual disturbances   Complete by: As directed    Call MD for:  extreme fatigue   Complete by: As directed    Call MD for:  persistant dizziness or light-headedness   Complete by: As directed    Call MD for:  persistant nausea and vomiting   Complete by: As directed    Call MD for:  severe uncontrolled pain   Complete by: As directed    Call MD for:  temperature >100.4   Complete by: As directed    Diet - low sodium heart healthy   Complete by: As directed    Discharge wound care:   Complete by: As directed    Cleanse bilateral heel wounds with saline, pat dry. Paint left heel ulcer with betadine, allow to air dry, perform daily Cut to fit small piece of xeroform gauze and place over right medial heel ulcer, cover with foam.   Increase activity slowly   Complete by: As directed       Allergies as of 12/30/2022       Reactions   Hydrocodone Other (See Comments)   Hallucination   Lisinopril Cough   Pioglitazone Swelling   edema   Varenicline Tartrate Other (See Comments)   Bad dreams        Medication List     STOP taking these medications    acetaminophen 325 MG tablet Commonly known as: TYLENOL   potassium chloride SA 20 MEQ tablet Commonly known as: KLOR-CON M   sennosides-docusate sodium 8.6-50 MG tablet Commonly known as: SENOKOT-S   umeclidinium-vilanterol 62.5-25 MCG/ACT Aepb Commonly known as: ANORO ELLIPTA       TAKE these medications    albuterol 108 (90 Base) MCG/ACT inhaler Commonly known as: Proventil HFA Inhale 2 puffs into the lungs every 4 (four) hours as needed for wheezing or shortness of breath.   amLODipine 5 MG tablet Commonly known as: NORVASC Take 5 mg by mouth daily.   aspirin EC 81 MG tablet Take 1 tablet (81 mg total) by mouth daily. Start taking on: January 06, 2023 What changed: These instructions start on January 06, 2023. If you are unsure what to do until then, ask your doctor or  other care provider.   atorvastatin 40 MG tablet Commonly known as: LIPITOR Take 40 mg by mouth daily.   clopidogrel 75 MG tablet Commonly known as: Plavix Take 1 tablet (75 mg total) by mouth daily. Start taking on: January 06, 2023 What changed: These instructions start on January 06, 2023. If you are unsure what to do until then, ask your doctor or other care provider.   furosemide 20 MG tablet Commonly known as: LASIX Take 40 mg by mouth daily.   gabapentin 300 MG capsule Commonly known as: NEURONTIN TAKE 1 CAPSULE(300 MG) BY MOUTH TWICE DAILY What changed: See the new instructions.   glucose blood test strip Commonly known as: OneTouch Verio 1 each by Other route 2 (two) times daily. And lancets 2/day   hydrALAZINE 25 MG tablet Commonly known as: APRESOLINE Take 1 tablet (25 mg total) by mouth every 8 (eight) hours.   insulin aspart 100 UNIT/ML injection Commonly known as: novoLOG Inject 0-15 Units into the skin 3 (three) times daily with meals.   ipratropium-albuterol 0.5-2.5 (3) MG/3ML Soln Commonly known as: DUONEB Take 3 mLs by nebulization every 6 (six) hours as  needed.   Lantus SoloStar 100 UNIT/ML Solostar Pen Generic drug: insulin glargine Inject 10 Units into the skin daily. What changed: when to take this   levETIRAcetam 500 MG tablet Commonly known as: KEPPRA Take 500 mg by mouth 2 (two) times daily.   metoprolol tartrate 25 MG tablet Commonly known as: LOPRESSOR Take 0.5 tablets (12.5 mg total) by mouth 2 (two) times daily.   multivitamin with minerals Tabs tablet Take 1 tablet by mouth daily.   ondansetron 4 MG tablet Commonly known as: ZOFRAN Take 4 mg by mouth every 6 (six) hours as needed for nausea or vomiting.   pantoprazole 40 MG tablet Commonly known as: PROTONIX Take 1 tablet (40 mg total) by mouth 2 (two) times daily.   polyethylene glycol 17 g packet Commonly known as: MIRALAX / GLYCOLAX Take 17 g by mouth 2 (two) times  daily.   senna 8.6 MG Tabs tablet Commonly known as: SENOKOT Take 2 tablets (17.2 mg total) by mouth 2 (two) times daily.   Spiriva HandiHaler 18 MCG inhalation capsule Generic drug: tiotropium Place 1 capsule into inhaler and inhale daily.   tamsulosin 0.4 MG Caps capsule Commonly known as: FLOMAX Take 1 capsule (0.4 mg total) by mouth daily.   traMADol 50 MG tablet Commonly known as: ULTRAM Take 50 mg by mouth every 6 (six) hours as needed.               Discharge Care Instructions  (From admission, onward)           Start     Ordered   12/30/22 0000  Discharge wound care:       Comments: Cleanse bilateral heel wounds with saline, pat dry. Paint left heel ulcer with betadine, allow to air dry, perform daily Cut to fit small piece of xeroform gauze and place over right medial heel ulcer, cover with foam.   12/30/22 0912            Follow-up Information     [facility] Follow up.   Why: [facility] will continue to provide care when you are discharged home. Contact information: [address] [place] [address] [phone]         Raymon Mutton., FNP. Schedule an appointment as soon as possible for a visit.   Specialty: Family Medicine Why: Call the office and schedule a hospital follow up in the next 7-10 days. Contact information: [address] [place] [address] [phone]                Allergies  Allergen Reactions   Hydrocodone Other (See Comments)    Hallucination   Lisinopril Cough   Pioglitazone Swelling    edema   Varenicline Tartrate Other (See Comments)    Bad dreams    Consultations: [facility]   Procedures/Studies: DG Abd 1 View  Result Date: 12/29/2022 CLINICAL DATA:  [id number] Constipation [id number] Patient is experiencing some constipation as well as nausea and vomiting eval abdomen. EXAM: ABDOMEN - 1 VIEW COMPARISON:  Chest x-ray 12/27/2022 FINDINGS: Limited evaluation due  to overlapping osseous structures and overlying soft tissues. Stool noted within the colon. The bowel gas pattern is normal. Phleboliths again noted overlying the pelvis. No radio-opaque calculi or other significant radiographic abnormality are seen. Severe degenerative changes of the spine with dextroscoliosis of mid lumbar spine. IMPRESSION: Nonobstructive bowel gas pattern. Electronically Signed   By: Tish Frederickson M.D.   On: 12/29/2022 11:02   DG  HIP UNILAT WITH PELVIS 1V LEFT  Result Date: 12/27/2022 CLINICAL DATA:  Left hip pain. EXAM: DG HIP (WITH OR WITHOUT PELVIS) 1V*L* COMPARISON:  Left hip radiograph dated 10/30/2022. FINDINGS: Evaluation is limited due to body habitus. There is no acute fracture or dislocation. The bones are osteopenic. Mild bilateral hip arthritic changes. Degenerative changes of the lower lumbar spine. The soft tissues unremarkable moderate stool throughout the visualized colon. IMPRESSION: 1. No acute fracture or dislocation. 2. Osteopenia and mild bilateral hip arthritic changes. Electronically Signed   By: Elgie Collard M.D.   On: 12/27/2022 17:19   DG Abd 1 View  Result Date: 12/27/2022 CLINICAL DATA:  [id number] Constipation [id number] EXAM: ABDOMEN - 1 VIEW COMPARISON:  CT AP, 12/24/2022.  KUB, 05/06/2015. FINDINGS: Mild air-distention of transverse colon. Nonobstructed bowel-gas pattern. No radio-opaque calculi or other significant radiographic abnormality are seen. IMPRESSION: Nonobstructed bowel-gas pattern. Electronically Signed   By: Roanna Banning M.D.   On: 12/27/2022 08:11   CT ABDOMEN PELVIS W CONTRAST  Result Date: 12/24/2022 CLINICAL DATA:  Abdominal pain with nausea and vomiting. EXAM: CT ABDOMEN AND PELVIS WITH CONTRAST TECHNIQUE: Multidetector CT imaging of the abdomen and pelvis was performed using the standard protocol following bolus administration of intravenous contrast. RADIATION DOSE REDUCTION: This exam was performed according to the departmental  dose-optimization program which includes automated exposure control, adjustment of the mA and/or kV according to patient size and/or use of iterative reconstruction technique. CONTRAST:  75mL OMNIPAQUE IOHEXOL 350 MG/ML SOLN COMPARISON:  CT abdomen pelvis dated December 04, 2022. FINDINGS: Lower chest: No acute abnormality. Hepatobiliary: No focal liver abnormality. Unchanged large gallstone. No gallbladder wall thickening or biliary dilatation. Pancreas: Unremarkable. No pancreatic ductal dilatation or surrounding inflammatory changes. Spleen: Normal in size without focal abnormality. Adrenals/Urinary Tract: Unchanged nodular thickening of the left adrenal gland. Normal right adrenal gland. No renal calculi, mass, or hydronephrosis. The bladder is unremarkable. Stomach/Bowel: Increased stool burden in the left colon. Enlarging rectal stool ball with increased rectal wall thickening, surrounding inflammatory change, and presacral edema. No obstruction. Prior appendectomy. Vascular/Lymphatic: Aortic atherosclerosis. No enlarged abdominal or pelvic lymph nodes. Reproductive: Status post hysterectomy. No adnexal masses. Other: No free fluid or pneumoperitoneum. Musculoskeletal: No acute or significant osseous findings. IMPRESSION: 1. Enlarging rectal stool ball with increased rectal wall thickening, surrounding inflammatory change, and presacral edema, concerning for stercoral colitis. 2. Increased stool burden in the left colon.  No obstruction. 3. Unchanged cholelithiasis. 4.  Aortic Atherosclerosis (ICD10-I70.0). Electronically Signed   By: Obie Dredge M.D.   On: 12/24/2022 12:58   CT Angio Chest/Abd/Pel for Dissection W and/or Wo Contrast  Result Date: 12/04/2022 CLINICAL DATA:  Aortic aneurysm, back pain radiating and abdomen, presumed right upper lobe lung cancer * Tracking Code: BO * EXAM: CT ANGIOGRAPHY CHEST, ABDOMEN AND PELVIS CT LUMBAR SPINE WITH CONTRAST TECHNIQUE: Non-contrast CT of the chest was  initially obtained. Multidetector CT imaging through the chest, abdomen and pelvis was performed using the standard protocol during bolus administration of intravenous contrast. Multiplanar reconstructed images and MIPs were obtained and reviewed to evaluate the vascular anatomy. Multidetector CT imaging through the lumbar spine was performed using the standard protocol during bolus administration of intravenous contrast. RADIATION DOSE REDUCTION: This exam was performed according to the departmental dose-optimization program which includes automated exposure control, adjustment of the mA and/or kV according to patient size and/or use of iterative reconstruction technique. CONTRAST:  75mL OMNIPAQUE IOHEXOL 350 MG/ML SOLN COMPARISON:  PET-CT, 11/18/2022 FINDINGS: Examination  is generally somewhat limited by patient arm positioning and associated streak artifact as well as body habitus. CTA CHEST FINDINGS VASCULAR Aorta: Satisfactory opacification of the aorta. Normal contour and caliber of the thoracic aorta. No evidence of aneurysm, dissection, or other acute aortic pathology. Mild aortic atherosclerosis. Cardiovascular: No evidence of pulmonary embolism on limited non-tailored examination. Normal heart size. Left and right coronary artery calcifications. No pericardial effusion. Review of the MIP images confirms the above findings. NON VASCULAR Mediastinum/Nodes: Unchanged prominent pretracheal lymph nodes (series 5, image 28) thyroid gland, trachea, and esophagus demonstrate no significant findings. Lungs/Pleura: Unchanged right upper lobe nodule measuring 2.4 x 2.2 cm (series 7, image 56). No pleural effusion or pneumothorax. Bibasilar scarring or atelectasis. Musculoskeletal: No chest wall abnormality. No acute osseous findings. Review of the MIP images confirms the above findings. CTA ABDOMEN AND PELVIS FINDINGS VASCULAR Normal contour and caliber of the abdominal aorta. No evidence of aneurysm, dissection, or  other acute aortic pathology. Standard branching pattern of the abdominal aorta with solitary bilateral renal arteries. Minimal atherosclerosis. Review of the MIP images confirms the above findings. NON-VASCULAR Hepatobiliary: No solid liver abnormality is seen. Gallstone. No gallbladder wall thickening, or biliary dilatation. Pancreas: Unremarkable. No pancreatic ductal dilatation or surrounding inflammatory changes. Spleen: Normal in size without significant abnormality. Adrenals/Urinary Tract: Definitively benign, macroscopic fat containing left adrenal adenoma, requiring no further follow-up or characterization right adrenal is normal. Kidneys are normal, without renal calculi, solid lesion, or hydronephrosis. Bladder is unremarkable. Stomach/Bowel: Stomach is within normal limits. Appendix not clearly visualized and may be surgically absent. No evidence of bowel wall thickening, distention, or inflammatory changes. Lymphatic: No enlarged abdominal or pelvic lymph nodes. Reproductive: Status post hysterectomy. Other: No abdominal wall hernia or abnormality. No ascites. Musculoskeletal: No acute osseous findings. CT LUMBAR SPINE FINDINGS Alignment: Straightening of the normal lumbar lordosis. Gentle dextroscoliosis. Vertebral bodies: Intact. No fracture or dislocation. Disc spaces: Severe multilevel disc space height loss and osteophytosis, worst at L3-L4. severe facet degenerative change of the lower lumbar levels. Paraspinous soft tissues: Unremarkable. IMPRESSION: 1. Normal contour and caliber of the thoracic and abdominal aorta. No evidence of aneurysm, dissection, or other acute aortic pathology. Mild atherosclerosis. 2. Unchanged right upper lobe nodule measuring 2.4 x 2.2 cm, previously FDG avid and consistent with primary lung malignancy. 3. Unchanged prominent pretracheal lymph nodes, not previously FDG avid and most likely reactive. 4. No evidence of metastatic disease in the abdomen or pelvis. 5. No  acute CT findings of the lumbar spine. 6. Severe multilevel disc space height loss and osteophytosis, worst at L3-L4. Severe facet degenerative change of the lower lumbar levels. Lumbar disc and neural foraminal pathology may be further evaluated by MRI if indicated by neurologically localizing signs and symptoms. 7. Cholelithiasis. 8. Coronary artery disease. Aortic Atherosclerosis (ICD10-I70.0). Electronically Signed   By: Jearld Lesch M.D.   On: 12/04/2022 16:03   CT L-SPINE NO CHARGE  Result Date: 12/04/2022 CLINICAL DATA:  Aortic aneurysm, back pain radiating and abdomen, presumed right upper lobe lung cancer * Tracking Code: BO * EXAM: CT ANGIOGRAPHY CHEST, ABDOMEN AND PELVIS CT LUMBAR SPINE WITH CONTRAST TECHNIQUE: Non-contrast CT of the chest was initially obtained. Multidetector CT imaging through the chest, abdomen and pelvis was performed using the standard protocol during bolus administration of intravenous contrast. Multiplanar reconstructed images and MIPs were obtained and reviewed to evaluate the vascular anatomy. Multidetector CT imaging through the lumbar spine was performed using the standard protocol during bolus administration of  intravenous contrast. RADIATION DOSE REDUCTION: This exam was performed according to the departmental dose-optimization program which includes automated exposure control, adjustment of the mA and/or kV according to patient size and/or use of iterative reconstruction technique. CONTRAST:  75mL OMNIPAQUE IOHEXOL 350 MG/ML SOLN COMPARISON:  PET-CT, 11/18/2022 FINDINGS: Examination is generally somewhat limited by patient arm positioning and associated streak artifact as well as body habitus. CTA CHEST FINDINGS VASCULAR Aorta: Satisfactory opacification of the aorta. Normal contour and caliber of the thoracic aorta. No evidence of aneurysm, dissection, or other acute aortic pathology. Mild aortic atherosclerosis. Cardiovascular: No evidence of pulmonary embolism on  limited non-tailored examination. Normal heart size. Left and right coronary artery calcifications. No pericardial effusion. Review of the MIP images confirms the above findings. NON VASCULAR Mediastinum/Nodes: Unchanged prominent pretracheal lymph nodes (series 5, image 28) thyroid gland, trachea, and esophagus demonstrate no significant findings. Lungs/Pleura: Unchanged right upper lobe nodule measuring 2.4 x 2.2 cm (series 7, image 56). No pleural effusion or pneumothorax. Bibasilar scarring or atelectasis. Musculoskeletal: No chest wall abnormality. No acute osseous findings. Review of the MIP images confirms the above findings. CTA ABDOMEN AND PELVIS FINDINGS VASCULAR Normal contour and caliber of the abdominal aorta. No evidence of aneurysm, dissection, or other acute aortic pathology. Standard branching pattern of the abdominal aorta with solitary bilateral renal arteries. Minimal atherosclerosis. Review of the MIP images confirms the above findings. NON-VASCULAR Hepatobiliary: No solid liver abnormality is seen. Gallstone. No gallbladder wall thickening, or biliary dilatation. Pancreas: Unremarkable. No pancreatic ductal dilatation or surrounding inflammatory changes. Spleen: Normal in size without significant abnormality. Adrenals/Urinary Tract: Definitively benign, macroscopic fat containing left adrenal adenoma, requiring no further follow-up or characterization right adrenal is normal. Kidneys are normal, without renal calculi, solid lesion, or hydronephrosis. Bladder is unremarkable. Stomach/Bowel: Stomach is within normal limits. Appendix not clearly visualized and may be surgically absent. No evidence of bowel wall thickening, distention, or inflammatory changes. Lymphatic: No enlarged abdominal or pelvic lymph nodes. Reproductive: Status post hysterectomy. Other: No abdominal wall hernia or abnormality. No ascites. Musculoskeletal: No acute osseous findings. CT LUMBAR SPINE FINDINGS Alignment:  Straightening of the normal lumbar lordosis. Gentle dextroscoliosis. Vertebral bodies: Intact. No fracture or dislocation. Disc spaces: Severe multilevel disc space height loss and osteophytosis, worst at L3-L4. severe facet degenerative change of the lower lumbar levels. Paraspinous soft tissues: Unremarkable. IMPRESSION: 1. Normal contour and caliber of the thoracic and abdominal aorta. No evidence of aneurysm, dissection, or other acute aortic pathology. Mild atherosclerosis. 2. Unchanged right upper lobe nodule measuring 2.4 x 2.2 cm, previously FDG avid and consistent with primary lung malignancy. 3. Unchanged prominent pretracheal lymph nodes, not previously FDG avid and most likely reactive. 4. No evidence of metastatic disease in the abdomen or pelvis. 5. No acute CT findings of the lumbar spine. 6. Severe multilevel disc space height loss and osteophytosis, worst at L3-L4. Severe facet degenerative change of the lower lumbar levels. Lumbar disc and neural foraminal pathology may be further evaluated by MRI if indicated by neurologically localizing signs and symptoms. 7. Cholelithiasis. 8. Coronary artery disease. Aortic Atherosclerosis (ICD10-I70.0). Electronically Signed   By: Jearld Lesch M.D.   On: 12/04/2022 16:03     Subjective: Patient seen examined bedside, resting calmly.  Lying in bed.  Continues with bowel movements, abdominal pain now resolved.  Refused labs this morning.  Stable for discharge home.  Discussed increased bowel regimen on return home.  No other specific questions or concerns at this time.  Denies  headache, no dizziness, no chest pain, no palpitations, no shortness of breath, no abdominal pain, no fever.  No acute events overnight per nursing staff.  Discharge Exam: Vitals:   12/30/22 0439 12/30/22 0740  BP: (!) 126/55 (!) 137/55  Pulse: 69 73  Resp: 18 18  Temp: 99.3 F (37.4 C) 98.4 F (36.9 C)  SpO2: 100% (!) 88%   Vitals:   12/29/22 1812 12/29/22 1933 12/30/22  0439 12/30/22 0740  BP: (!) 104/47 (!) 125/47 (!) 126/55 (!) 137/55  Pulse: 62 60 69 73  Resp: 18 18 18 18   Temp: 99.7 F (37.6 C) 99.5 F (37.5 C) 99.3 F (37.4 C) 98.4 F (36.9 C)  TempSrc:  Oral Oral Oral  SpO2: 96% 100% 100% (!) 88%  Weight:   125.9 kg   Height:        Physical Exam: GEN: NAD, alert and oriented x 3, obese, chronically ill in appearance HEENT: NCAT, PERRL, EOMI, sclera clear, MMM PULM: CTAB w/o wheezes/crackles, normal respiratory effort, on room air CV: RRR w/o M/G/R GI: abd soft, NTND, NABS, no R/G/M MSK: no peripheral edema, moves all extremities independently NEURO: No focal neurological deficits PSYCH: normal mood/affect Integumentary: Sacral and bilateral heel wounds noted, otherwise no other concerning rashes/lesions/wounds nonexposed skin surfaces.      The results of significant diagnostics from this hospitalization (including imaging, microbiology, ancillary and laboratory) are listed below for reference.     Microbiology: No results found for this or any previous visit (from the past 240 hour(s)).   Labs: BNP (last 3 results) Recent Labs    09/12/22 0906 10/28/22 0957  BNP 75.7 62.8   Basic Metabolic Panel: Recent Labs  Lab 12/24/22 0815 12/25/22 0852 12/26/22 0835 12/27/22 1101 12/28/22 0859  NA 136  --  139 135 138  K 3.8  --  3.9 3.7 4.0  CL 101  --  101 100 102  CO2 25  --  29 30 31   GLUCOSE 136*  --  97 107* 155*  BUN 9  --  10 10 10   CREATININE 0.86 0.79 0.80 0.89 0.96  CALCIUM 8.7*  --  8.3* 7.8* 7.8*   Liver Function Tests: Recent Labs  Lab 12/24/22 0815  AST 17  ALT 13  ALKPHOS 88  BILITOT 0.4  PROT 6.8  ALBUMIN 3.1*   Recent Labs  Lab 12/24/22 0815  LIPASE 22   No results for input(s): "AMMONIA" in the last 168 hours. CBC: Recent Labs  Lab 12/24/22 0815 12/25/22 0852 12/26/22 0111 12/26/22 0835 12/27/22 1101 12/28/22 0859  WBC 9.8 11.3*  --  9.2 9.4 8.0  NEUTROABS 8.0*  --   --   --   --    --   HGB 8.3* 7.4* 7.0* 6.6* 7.6* 7.5*  HCT 26.2* 24.0* 22.7* 21.7* 24.0* 24.1*  MCV 91.3 90.2  --  90.4 89.6 94.1  PLT 356 313  --  296 299 307   Cardiac Enzymes: No results for input(s): "CKTOTAL", "CKMB", "CKMBINDEX", "TROPONINI" in the last 168 hours. BNP: Invalid input(s): "POCBNP" CBG: Recent Labs  Lab 12/29/22 1254 12/29/22 1552 12/29/22 1934 12/30/22 0655 12/30/22 0819  GLUCAP 167* 164* 143* 130* 173*   D-Dimer No results for input(s): "DDIMER" in the last 72 hours. Hgb A1c No results for input(s): "HGBA1C" in the last 72 hours. Lipid Profile No results for input(s): "CHOL", "HDL", "LDLCALC", "TRIG", "CHOLHDL", "LDLDIRECT" in the last 72 hours. Thyroid function studies No results for input(s): "TSH", "T4TOTAL", "  T3FREE", "THYROIDAB" in the last 72 hours.  Invalid input(s): "FREET3" Anemia work up No results for input(s): "VITAMINB12", "FOLATE", "FERRITIN", "TIBC", "IRON", "RETICCTPCT" in the last 72 hours. Urinalysis    Component Value Date/Time   COLORURINE YELLOW 12/24/2022 1251   APPEARANCEUR CLEAR 12/24/2022 1251   LABSPEC 1.036 (H) 12/24/2022 1251   PHURINE 6.0 12/24/2022 1251   GLUCOSEU NEGATIVE 12/24/2022 1251   GLUCOSEU >=1000 (A) 05/31/2017 1022   HGBUR NEGATIVE 12/24/2022 1251   BILIRUBINUR NEGATIVE 12/24/2022 1251   KETONESUR 5 (A) 12/24/2022 1251   PROTEINUR 100 (A) 12/24/2022 1251   UROBILINOGEN 1.0 05/31/2017 1022   NITRITE NEGATIVE 12/24/2022 1251   LEUKOCYTESUR NEGATIVE 12/24/2022 1251   Sepsis Labs Recent Labs  Lab 12/25/22 0852 12/26/22 0835 12/27/22 1101 12/28/22 0859  WBC 11.3* 9.2 9.4 8.0   Microbiology No results found for this or any previous visit (from the past 240 hour(s)).   Time coordinating discharge: Over 30 minutes  SIGNED:   Alvira Philips [place], DO  [facility] 12/30/2022, 9:13 AM

## 2022-12-30 NOTE — Plan of Care (Signed)
A/Ox2 and on room air. Prn morphine given x1 for generalized pain. Total care provided. ACHS. BM x2 overnight. No acute changes noted. Heels redressed.    Problem: Education: Goal: Knowledge of General Education information will improve Description: Including pain rating scale, medication(s)/side effects and non-pharmacologic comfort measures Outcome: Progressing   Problem: Health Behavior/Discharge Planning: Goal: Ability to manage health-related needs will improve Outcome: Progressing   Problem: Clinical Measurements: Goal: Ability to maintain clinical measurements within normal limits will improve Outcome: Progressing Goal: Will remain free from infection Outcome: Progressing Goal: Diagnostic test results will improve Outcome: Progressing Goal: Respiratory complications will improve Outcome: Progressing Goal: Cardiovascular complication will be avoided Outcome: Progressing   Problem: Activity: Goal: Risk for activity intolerance will decrease Outcome: Progressing   Problem: Nutrition: Goal: Adequate nutrition will be maintained Outcome: Progressing   Problem: Coping: Goal: Level of anxiety will decrease Outcome: Progressing   Problem: Elimination: Goal: Will not experience complications related to bowel motility Outcome: Progressing Goal: Will not experience complications related to urinary retention Outcome: Progressing   Problem: Pain Managment: Goal: General experience of comfort will improve Outcome: Progressing   Problem: Safety: Goal: Ability to remain free from injury will improve Outcome: Progressing   Problem: Skin Integrity: Goal: Risk for impaired skin integrity will decrease Outcome: Progressing   Problem: Education: Goal: Understanding of CV disease, CV risk reduction, and recovery process will improve Outcome: Progressing Goal: Individualized Educational Video(s) Outcome: Progressing   Problem: Activity: Goal: Ability to return to baseline  activity level will improve Outcome: Progressing   Problem: Cardiovascular: Goal: Ability to achieve and maintain adequate cardiovascular perfusion will improve Outcome: Progressing Goal: Vascular access site(s) Level 0-1 will be maintained Outcome: Progressing   Problem: Health Behavior/Discharge Planning: Goal: Ability to safely manage health-related needs after discharge will improve Outcome: Progressing

## 2023-01-17 ENCOUNTER — Telehealth: Payer: Self-pay | Admitting: Radiation Oncology

## 2023-01-17 NOTE — Telephone Encounter (Signed)
Per [name]'s 8/27 note the patient does not want radiation and has decided to go on hospice. Closing referral until further notice.

## 2023-02-11 ENCOUNTER — Emergency Department (HOSPITAL_COMMUNITY)
Admission: EM | Admit: 2023-02-11 | Discharge: 2023-02-11 | Disposition: A | Discharge status: Home / Self Care | Attending: Student | Admitting: Student

## 2023-02-11 ENCOUNTER — Encounter (HOSPITAL_COMMUNITY): Payer: Self-pay

## 2023-02-11 ENCOUNTER — Emergency Department (HOSPITAL_COMMUNITY)

## 2023-02-11 DIAGNOSIS — R918 Other nonspecific abnormal finding of lung field: Secondary | ICD-10-CM | POA: Diagnosis not present

## 2023-02-11 DIAGNOSIS — I110 Hypertensive heart disease with heart failure: Secondary | ICD-10-CM | POA: Insufficient documentation

## 2023-02-11 DIAGNOSIS — Z7902 Long term (current) use of antithrombotics/antiplatelets: Secondary | ICD-10-CM | POA: Insufficient documentation

## 2023-02-11 DIAGNOSIS — Z85118 Personal history of other malignant neoplasm of bronchus and lung: Secondary | ICD-10-CM | POA: Diagnosis not present

## 2023-02-11 DIAGNOSIS — Z794 Long term (current) use of insulin: Secondary | ICD-10-CM | POA: Insufficient documentation

## 2023-02-11 DIAGNOSIS — R0602 Shortness of breath: Secondary | ICD-10-CM | POA: Diagnosis not present

## 2023-02-11 DIAGNOSIS — R1032 Left lower quadrant pain: Secondary | ICD-10-CM | POA: Insufficient documentation

## 2023-02-11 DIAGNOSIS — Z7982 Long term (current) use of aspirin: Secondary | ICD-10-CM | POA: Diagnosis not present

## 2023-02-11 DIAGNOSIS — M7989 Other specified soft tissue disorders: Secondary | ICD-10-CM | POA: Insufficient documentation

## 2023-02-11 DIAGNOSIS — E1143 Type 2 diabetes mellitus with diabetic autonomic (poly)neuropathy: Secondary | ICD-10-CM | POA: Insufficient documentation

## 2023-02-11 DIAGNOSIS — I5032 Chronic diastolic (congestive) heart failure: Secondary | ICD-10-CM | POA: Insufficient documentation

## 2023-02-11 DIAGNOSIS — J449 Chronic obstructive pulmonary disease, unspecified: Secondary | ICD-10-CM | POA: Diagnosis not present

## 2023-02-11 DIAGNOSIS — F17210 Nicotine dependence, cigarettes, uncomplicated: Secondary | ICD-10-CM | POA: Insufficient documentation

## 2023-02-11 DIAGNOSIS — I2510 Atherosclerotic heart disease of native coronary artery without angina pectoris: Secondary | ICD-10-CM | POA: Insufficient documentation

## 2023-02-11 DIAGNOSIS — Z79899 Other long term (current) drug therapy: Secondary | ICD-10-CM | POA: Diagnosis not present

## 2023-02-11 LAB — COMPREHENSIVE METABOLIC PANEL
ALT: 9 U/L (ref 0–44)
AST: 13 U/L — ABNORMAL LOW (ref 15–41)
Albumin: 2.7 g/dL — ABNORMAL LOW (ref 3.5–5.0)
Alkaline Phosphatase: 76 U/L (ref 38–126)
Anion gap: 12 (ref 5–15)
BUN: 10 mg/dL (ref 8–23)
CO2: 26 mmol/L (ref 22–32)
Calcium: 9.4 mg/dL (ref 8.9–10.3)
Chloride: 99 mmol/L (ref 98–111)
Creatinine, Ser: 1.06 mg/dL — ABNORMAL HIGH (ref 0.44–1.00)
GFR, Estimated: 53 mL/min — ABNORMAL LOW (ref >60)
Glucose, Bld: 154 mg/dL — ABNORMAL HIGH (ref 70–99)
Potassium: 4 mmol/L (ref 3.5–5.1)
Sodium: 137 mmol/L (ref 135–145)
Total Bilirubin: 0.8 mg/dL (ref 0.3–1.2)
Total Protein: 6.4 g/dL — ABNORMAL LOW (ref 6.5–8.1)

## 2023-02-11 LAB — CBC WITH DIFFERENTIAL/PLATELET
Abs Immature Granulocytes: 0.05 10*3/uL (ref 0.00–0.07)
Basophils Absolute: 0 10*3/uL (ref 0.0–0.1)
Basophils Relative: 0 %
Eosinophils Absolute: 0 10*3/uL (ref 0.0–0.5)
Eosinophils Relative: 0 %
HCT: 30 % — ABNORMAL LOW (ref 36.0–46.0)
Hemoglobin: 9.1 g/dL — ABNORMAL LOW (ref 12.0–15.0)
Immature Granulocytes: 0 %
Lymphocytes Relative: 3 %
Lymphs Abs: 0.4 10*3/uL — ABNORMAL LOW (ref 0.7–4.0)
MCH: 26.6 pg (ref 26.0–34.0)
MCHC: 30.3 g/dL (ref 30.0–36.0)
MCV: 87.7 fL (ref 80.0–100.0)
Monocytes Absolute: 0.4 10*3/uL (ref 0.1–1.0)
Monocytes Relative: 3 %
Neutro Abs: 12.6 10*3/uL — ABNORMAL HIGH (ref 1.7–7.7)
Neutrophils Relative %: 94 %
Platelets: 358 10*3/uL (ref 150–400)
RBC: 3.42 MIL/uL — ABNORMAL LOW (ref 3.87–5.11)
RDW: 15.4 % (ref 11.5–15.5)
WBC: 13.5 10*3/uL — ABNORMAL HIGH (ref 4.0–10.5)
nRBC: 0 % (ref 0.0–0.2)

## 2023-02-11 LAB — BRAIN NATRIURETIC PEPTIDE: B Natriuretic Peptide: 107.4 pg/mL — ABNORMAL HIGH (ref 0.0–100.0)

## 2023-02-11 LAB — TROPONIN I (HIGH SENSITIVITY)
Troponin I (High Sensitivity): 35 ng/L — ABNORMAL HIGH (ref <18)
Troponin I (High Sensitivity): 78 ng/L — ABNORMAL HIGH (ref <18)
Troponin I (High Sensitivity): 54 ng/L — ABNORMAL HIGH (ref <18)

## 2023-02-11 LAB — LIPASE, BLOOD: Lipase: 21 U/L (ref 11–51)

## 2023-02-11 MED ORDER — IOHEXOL 350 MG/ML SOLN
75.0000 mL | Freq: Once | INTRAVENOUS | Status: AC | PRN
Start: 2023-02-11 — End: 2023-02-11
  Administered 2023-02-11: 75 mL via INTRAVENOUS
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
## 2023-02-11 NOTE — ED Provider Notes (Signed)
  Physical Exam  BP (!) 133/111 (BP Location: Left Arm)   Pulse 99   Temp 98.7 F (37.1 C) (Oral)   Resp (!) 24   Ht 5\' 6"  (1.676 m)   Wt 113.4 kg   SpO2 100%   BMI 40.35 kg/m   Physical Exam Vitals and nursing note reviewed.  HENT:     Head: Normocephalic and atraumatic.  Eyes:     Pupils: Pupils are equal, round, and reactive to light.  Cardiovascular:     Rate and Rhythm: Normal rate and regular rhythm.  Pulmonary:     Effort: Pulmonary effort is normal.     Breath sounds: Normal breath sounds.  Abdominal:     Palpations: Abdomen is soft.     Tenderness: There is no abdominal tenderness.  Skin:    General: Skin is warm and dry.  Neurological:     Mental Status: She is alert.  Psychiatric:        Mood and Affect: Mood normal.     Procedures  Procedures  ED Course / MDM   Clinical Course as of 02/11/23 1225  Fri Feb 11, 2023  1610 Reevaluated patient.  Patient resting comfortably at this time requesting discharge home.  Initial troponin of 35 with delta of 78.  Still no chest pain.  No ischemic changes on EKG.  Will obtain third troponin.  Still awaiting results of CT abdomen pelvis [MP]  1000 CT abdomen pelvis shows no acute findings and interval worsening of known lung mass.  Patient resting comfortably at this time.  Awaiting third troponin [MP]  1222 Third troponin is downtrending.  Informed patient of CT findings.  Discussed rationale for admission for observation versus discharge home with hospice care.  Patient understands the risk of going home and would like to go home on hospice care.  She has been stable on 2 L nasal cannula.  Stable for discharge.  She understands the return precautions that be worrisome for worsening respiratory status and will come back to the ED [MP]    Clinical Course User Index [MP] Royanne Foots, DO   Medical Decision Making I, Estelle June DO, have assumed care of this patient from the previous provider.  82 year old  femaleWith history of COPD on home oxygen, lung cancer, CVA type 2 diabetes presenting for increased leg swelling and shortness of breath.  She is currently on hospice care and is DNR/DNI.  Signed out pending CT abdomen pelvis, reevaluation and disposition  Amount and/or Complexity of Data Reviewed Labs: ordered. Radiology: ordered.  Risk Prescription drug management.   Final diagnosis Lower extremity given Shortness of breath Lung mass       Royanne Foots, DO 02/11/23 1225

## 2023-02-11 NOTE — ED Triage Notes (Signed)
Pt from home BIB [facility] from bilateral leg swelling for a few days, hx of same and hx of CHF and lung cancer. Pt is on hospice & DNR. Pt also reports generalized pain, hx of chronic pain. VSS per EMS A&O at baseline. Pt normally wears 6L [place] at baseline.

## 2023-02-11 NOTE — ED Notes (Signed)
Patient transported to CT 

## 2023-02-11 NOTE — ED Notes (Signed)
Lab called with a troponin result of 78

## 2023-02-11 NOTE — ED Notes (Signed)
Attempted for IV stick, this RN was able to start an IV in pt's right AC, obtain blood return, as this RN attempted to advance IV, pt pulled her arm back and IV was dislodge. Pressure dressing applied.  This RN looked for other places to attempt IV stick however pt tells this RN "no, you can't stick there" Pt has potential IV access to right hand/wrist, pt refused d/t "stroke hand". Another possible access to left forearm, pt refused as well d/t "that's going to hurt". Pt reports is in chronic pain daily, and does not want to be stuck in "certain places" due to pain/uncomfortableness. Reports has a "special person that sticks my IV and I don't want them to mess up" Advised will call for IV team to come and attempt.

## 2023-02-11 NOTE — Discharge Instructions (Addendum)
You were seen in the Emergency Department for leg swelling and shortness of breath A CAT scan taken shows an increased size of the lung mass We discussed staying in the hospital versus going home At this time she wanted to go back home with hospice care in place It is important that you come back to the emergency department if you have trouble breathing or any other concerns Otherwise please follow-up with your primary care doctor in 1 week for reevaluation

## 2023-02-15 ENCOUNTER — Inpatient Hospital Stay (HOSPITAL_BASED_OUTPATIENT_CLINIC_OR_DEPARTMENT_OTHER): Payer: Medicare PPO | Admitting: Pulmonary Disease

## 2023-02-17 ENCOUNTER — Encounter (HOSPITAL_BASED_OUTPATIENT_CLINIC_OR_DEPARTMENT_OTHER): Payer: Self-pay | Admitting: Pulmonary Disease

## 2023-02-21 ENCOUNTER — Inpatient Hospital Stay (HOSPITAL_COMMUNITY)
Admission: EM | Admit: 2023-02-21 | Discharge: 2023-02-24 | DRG: 299 | Disposition: A | Discharge status: Hospice | Payer: Medicare Other | Attending: Internal Medicine | Admitting: Internal Medicine

## 2023-02-21 ENCOUNTER — Emergency Department (HOSPITAL_COMMUNITY): Payer: Medicare Other

## 2023-02-21 ENCOUNTER — Telehealth: Payer: Self-pay | Admitting: Podiatry

## 2023-02-21 ENCOUNTER — Encounter (HOSPITAL_COMMUNITY): Payer: Self-pay

## 2023-02-21 ENCOUNTER — Other Ambulatory Visit: Payer: Self-pay

## 2023-02-21 DIAGNOSIS — Z66 Do not resuscitate: Secondary | ICD-10-CM | POA: Diagnosis present

## 2023-02-21 DIAGNOSIS — Z8249 Family history of ischemic heart disease and other diseases of the circulatory system: Secondary | ICD-10-CM

## 2023-02-21 DIAGNOSIS — Z7982 Long term (current) use of aspirin: Secondary | ICD-10-CM

## 2023-02-21 DIAGNOSIS — Z794 Long term (current) use of insulin: Secondary | ICD-10-CM | POA: Diagnosis not present

## 2023-02-21 DIAGNOSIS — Z6841 Body Mass Index (BMI) 40.0 and over, adult: Secondary | ICD-10-CM

## 2023-02-21 DIAGNOSIS — J449 Chronic obstructive pulmonary disease, unspecified: Secondary | ICD-10-CM | POA: Diagnosis present

## 2023-02-21 DIAGNOSIS — L089 Local infection of the skin and subcutaneous tissue, unspecified: Secondary | ICD-10-CM | POA: Diagnosis not present

## 2023-02-21 DIAGNOSIS — E1152 Type 2 diabetes mellitus with diabetic peripheral angiopathy with gangrene: Principal | ICD-10-CM | POA: Diagnosis present

## 2023-02-21 DIAGNOSIS — J9611 Chronic respiratory failure with hypoxia: Secondary | ICD-10-CM | POA: Diagnosis present

## 2023-02-21 DIAGNOSIS — L89610 Pressure ulcer of right heel, unstageable: Secondary | ICD-10-CM | POA: Diagnosis present

## 2023-02-21 DIAGNOSIS — A480 Gas gangrene: Secondary | ICD-10-CM | POA: Diagnosis present

## 2023-02-21 DIAGNOSIS — G40909 Epilepsy, unspecified, not intractable, without status epilepticus: Secondary | ICD-10-CM | POA: Diagnosis present

## 2023-02-21 DIAGNOSIS — E1143 Type 2 diabetes mellitus with diabetic autonomic (poly)neuropathy: Secondary | ICD-10-CM | POA: Diagnosis present

## 2023-02-21 DIAGNOSIS — Z1152 Encounter for screening for COVID-19: Secondary | ICD-10-CM

## 2023-02-21 DIAGNOSIS — L97919 Non-pressure chronic ulcer of unspecified part of right lower leg with unspecified severity: Secondary | ICD-10-CM | POA: Diagnosis present

## 2023-02-21 DIAGNOSIS — Z860100 Personal history of colon polyps, unspecified: Secondary | ICD-10-CM

## 2023-02-21 DIAGNOSIS — L89620 Pressure ulcer of left heel, unstageable: Secondary | ICD-10-CM | POA: Diagnosis present

## 2023-02-21 DIAGNOSIS — M86671 Other chronic osteomyelitis, right ankle and foot: Secondary | ICD-10-CM | POA: Diagnosis present

## 2023-02-21 DIAGNOSIS — K3184 Gastroparesis: Secondary | ICD-10-CM | POA: Diagnosis present

## 2023-02-21 DIAGNOSIS — F17210 Nicotine dependence, cigarettes, uncomplicated: Secondary | ICD-10-CM | POA: Diagnosis present

## 2023-02-21 DIAGNOSIS — M869 Osteomyelitis, unspecified: Secondary | ICD-10-CM

## 2023-02-21 DIAGNOSIS — D638 Anemia in other chronic diseases classified elsewhere: Secondary | ICD-10-CM | POA: Diagnosis present

## 2023-02-21 DIAGNOSIS — I252 Old myocardial infarction: Secondary | ICD-10-CM

## 2023-02-21 DIAGNOSIS — K219 Gastro-esophageal reflux disease without esophagitis: Secondary | ICD-10-CM | POA: Diagnosis present

## 2023-02-21 DIAGNOSIS — I110 Hypertensive heart disease with heart failure: Secondary | ICD-10-CM | POA: Diagnosis present

## 2023-02-21 DIAGNOSIS — Z833 Family history of diabetes mellitus: Secondary | ICD-10-CM

## 2023-02-21 DIAGNOSIS — C3411 Malignant neoplasm of upper lobe, right bronchus or lung: Secondary | ICD-10-CM | POA: Diagnosis present

## 2023-02-21 DIAGNOSIS — I5032 Chronic diastolic (congestive) heart failure: Secondary | ICD-10-CM | POA: Diagnosis present

## 2023-02-21 DIAGNOSIS — G4733 Obstructive sleep apnea (adult) (pediatric): Secondary | ICD-10-CM | POA: Diagnosis present

## 2023-02-21 DIAGNOSIS — Z832 Family history of diseases of the blood and blood-forming organs and certain disorders involving the immune mechanism: Secondary | ICD-10-CM

## 2023-02-21 DIAGNOSIS — Z888 Allergy status to other drugs, medicaments and biological substances status: Secondary | ICD-10-CM

## 2023-02-21 DIAGNOSIS — E1169 Type 2 diabetes mellitus with other specified complication: Secondary | ICD-10-CM

## 2023-02-21 DIAGNOSIS — M549 Dorsalgia, unspecified: Secondary | ICD-10-CM | POA: Diagnosis present

## 2023-02-21 DIAGNOSIS — I2510 Atherosclerotic heart disease of native coronary artery without angina pectoris: Secondary | ICD-10-CM | POA: Diagnosis present

## 2023-02-21 DIAGNOSIS — M810 Age-related osteoporosis without current pathological fracture: Secondary | ICD-10-CM | POA: Diagnosis present

## 2023-02-21 DIAGNOSIS — E785 Hyperlipidemia, unspecified: Secondary | ICD-10-CM | POA: Diagnosis present

## 2023-02-21 DIAGNOSIS — Z8041 Family history of malignant neoplasm of ovary: Secondary | ICD-10-CM

## 2023-02-21 DIAGNOSIS — Z9981 Dependence on supplemental oxygen: Secondary | ICD-10-CM | POA: Diagnosis not present

## 2023-02-21 DIAGNOSIS — Z7189 Other specified counseling: Secondary | ICD-10-CM | POA: Diagnosis not present

## 2023-02-21 DIAGNOSIS — Z885 Allergy status to narcotic agent status: Secondary | ICD-10-CM

## 2023-02-21 DIAGNOSIS — T148XXA Other injury of unspecified body region, initial encounter: Secondary | ICD-10-CM | POA: Diagnosis not present

## 2023-02-21 DIAGNOSIS — G8929 Other chronic pain: Secondary | ICD-10-CM | POA: Diagnosis present

## 2023-02-21 DIAGNOSIS — Z800 Family history of malignant neoplasm of digestive organs: Secondary | ICD-10-CM

## 2023-02-21 DIAGNOSIS — F32A Depression, unspecified: Secondary | ICD-10-CM | POA: Diagnosis present

## 2023-02-21 DIAGNOSIS — Z79899 Other long term (current) drug therapy: Secondary | ICD-10-CM

## 2023-02-21 DIAGNOSIS — Z515 Encounter for palliative care: Secondary | ICD-10-CM | POA: Diagnosis not present

## 2023-02-21 DIAGNOSIS — Z7902 Long term (current) use of antithrombotics/antiplatelets: Secondary | ICD-10-CM

## 2023-02-21 LAB — CBC WITH DIFFERENTIAL/PLATELET
Abs Immature Granulocytes: 0.08 10*3/uL — ABNORMAL HIGH (ref 0.00–0.07)
Basophils Absolute: 0 10*3/uL (ref 0.0–0.1)
Basophils Relative: 0 %
Eosinophils Absolute: 0.1 10*3/uL (ref 0.0–0.5)
Eosinophils Relative: 1 %
HCT: 23.8 % — ABNORMAL LOW (ref 36.0–46.0)
Hemoglobin: 7.3 g/dL — ABNORMAL LOW (ref 12.0–15.0)
Immature Granulocytes: 1 %
Lymphocytes Relative: 10 %
Lymphs Abs: 1.5 10*3/uL (ref 0.7–4.0)
MCH: 25.8 pg — ABNORMAL LOW (ref 26.0–34.0)
MCHC: 30.7 g/dL (ref 30.0–36.0)
MCV: 84.1 fL (ref 80.0–100.0)
Monocytes Absolute: 1.5 10*3/uL — ABNORMAL HIGH (ref 0.1–1.0)
Monocytes Relative: 10 %
Neutro Abs: 11.6 10*3/uL — ABNORMAL HIGH (ref 1.7–7.7)
Neutrophils Relative %: 78 %
Platelets: 421 10*3/uL — ABNORMAL HIGH (ref 150–400)
RBC: 2.83 MIL/uL — ABNORMAL LOW (ref 3.87–5.11)
RDW: 15.9 % — ABNORMAL HIGH (ref 11.5–15.5)
WBC: 14.8 10*3/uL — ABNORMAL HIGH (ref 4.0–10.5)
nRBC: 0 % (ref 0.0–0.2)

## 2023-02-21 LAB — RESP PANEL BY RT-PCR (RSV, FLU A&B, COVID)  RVPGX2
Influenza A by PCR: NEGATIVE
Influenza B by PCR: NEGATIVE
Resp Syncytial Virus by PCR: NEGATIVE
SARS Coronavirus 2 by RT PCR: NEGATIVE

## 2023-02-21 LAB — COMPREHENSIVE METABOLIC PANEL
ALT: 8 U/L (ref 0–44)
AST: 17 U/L (ref 15–41)
Albumin: 1.8 g/dL — ABNORMAL LOW (ref 3.5–5.0)
Alkaline Phosphatase: 94 U/L (ref 38–126)
Anion gap: 10 (ref 5–15)
BUN: 11 mg/dL (ref 8–23)
CO2: 25 mmol/L (ref 22–32)
Calcium: 7.9 mg/dL — ABNORMAL LOW (ref 8.9–10.3)
Chloride: 97 mmol/L — ABNORMAL LOW (ref 98–111)
Creatinine, Ser: 0.73 mg/dL (ref 0.44–1.00)
GFR, Estimated: >60 mL/min (ref >60)
Glucose, Bld: 121 mg/dL — ABNORMAL HIGH (ref 70–99)
Potassium: 4.4 mmol/L (ref 3.5–5.1)
Sodium: 132 mmol/L — ABNORMAL LOW (ref 135–145)
Total Bilirubin: 0.8 mg/dL (ref <1.2)
Total Protein: 5.3 g/dL — ABNORMAL LOW (ref 6.5–8.1)

## 2023-02-21 LAB — I-STAT CG4 LACTIC ACID, ED
Lactic Acid, Venous: 1.3 mmol/L (ref 0.5–1.9)
Lactic Acid, Venous: 0.5 mmol/L (ref 0.5–1.9)

## 2023-02-21 LAB — APTT: aPTT: 31 s (ref 24–36)

## 2023-02-21 LAB — PROTIME-INR
INR: 1.2 (ref 0.8–1.2)
Prothrombin Time: 15 s (ref 11.4–15.2)

## 2023-02-21 LAB — GLUCOSE, CAPILLARY: Glucose-Capillary: 144 mg/dL — ABNORMAL HIGH (ref 70–99)

## 2023-02-21 MED ORDER — ENOXAPARIN SODIUM 40 MG/0.4ML IJ SOSY
40.0000 mg | PREFILLED_SYRINGE | INTRAMUSCULAR | Status: DC
  Administered 2023-02-21 – 2023-02-23 (×3): 40 mg via SUBCUTANEOUS
  Filled 2023-02-21 (×3): qty 0.4

## 2023-02-21 MED ORDER — FENTANYL CITRATE PF 50 MCG/ML IJ SOSY: 12.5000 ug | PREFILLED_SYRINGE | INTRAMUSCULAR | Status: DC | PRN

## 2023-02-21 MED ORDER — OXYCODONE HCL 5 MG PO TABS
5.0000 mg | ORAL_TABLET | Freq: Once | ORAL | Status: AC
Start: 2023-02-21 — End: 2023-02-21
  Administered 2023-02-21: 5 mg via ORAL
  Filled 2023-02-21: qty 1

## 2023-02-21 MED ORDER — ACETAMINOPHEN 325 MG PO TABS
650.0000 mg | ORAL_TABLET | Freq: Once | ORAL | Status: AC
  Administered 2023-02-21: 650 mg via ORAL
  Filled 2023-02-21: qty 2

## 2023-02-21 MED ORDER — VANCOMYCIN HCL 1750 MG/350ML IV SOLN
1750.0000 mg | Freq: Once | INTRAVENOUS | Status: AC
  Administered 2023-02-21: 1750 mg via INTRAVENOUS
  Filled 2023-02-21: qty 350

## 2023-02-21 MED ORDER — INSULIN ASPART 100 UNIT/ML IJ SOLN
0.0000 [IU] | Freq: Three times a day (TID) | INTRAMUSCULAR | Status: DC
  Administered 2023-02-22 (×2): 2 [IU] via SUBCUTANEOUS
  Administered 2023-02-22 – 2023-02-23 (×2): 1 [IU] via SUBCUTANEOUS
  Administered 2023-02-23 (×2): 2 [IU] via SUBCUTANEOUS
  Administered 2023-02-24: 3 [IU] via SUBCUTANEOUS
  Administered 2023-02-24: 2 [IU] via SUBCUTANEOUS

## 2023-02-21 MED ORDER — VANCOMYCIN HCL 1250 MG/250ML IV SOLN: 1250.0000 mg | INTRAVENOUS | Status: DC

## 2023-02-21 MED ORDER — ACETAMINOPHEN 650 MG RE SUPP: 650.0000 mg | Freq: Four times a day (QID) | RECTAL | Status: DC | PRN

## 2023-02-21 MED ORDER — CLINDAMYCIN PHOSPHATE 900 MG/50ML IV SOLN
900.0000 mg | Freq: Three times a day (TID) | INTRAVENOUS | Status: DC
  Administered 2023-02-21 – 2023-02-22 (×2): 900 mg via INTRAVENOUS
  Filled 2023-02-21 (×3): qty 50

## 2023-02-21 MED ORDER — PIPERACILLIN-TAZOBACTAM 3.375 G IVPB
3.3750 g | Freq: Three times a day (TID) | INTRAVENOUS | Status: DC
  Administered 2023-02-21 – 2023-02-24 (×8): 3.375 g via INTRAVENOUS
  Filled 2023-02-21 (×8): qty 50

## 2023-02-21 MED ORDER — ACETAMINOPHEN 500 MG PO TABS: 1000.0000 mg | ORAL_TABLET | Freq: Once | ORAL | Status: DC

## 2023-02-21 MED ORDER — INSULIN ASPART 100 UNIT/ML IJ SOLN: 0.0000 [IU] | Freq: Every day | INTRAMUSCULAR | Status: DC

## 2023-02-21 MED ORDER — PIPERACILLIN-TAZOBACTAM 3.375 G IVPB 30 MIN
3.3750 g | Freq: Once | INTRAVENOUS | Status: AC
  Administered 2023-02-21: 3.375 g via INTRAVENOUS
  Filled 2023-02-21: qty 50

## 2023-02-21 MED ORDER — ACETAMINOPHEN 325 MG PO TABS
650.0000 mg | ORAL_TABLET | Freq: Four times a day (QID) | ORAL | Status: DC | PRN
  Administered 2023-02-22: 650 mg via ORAL
  Filled 2023-02-21: qty 2

## 2023-02-21 NOTE — Consult Note (Addendum)
PODIATRY CONSULTATION  NAME [name] MRN [id number] DOB Nov 14, 1940 DOA 02/21/2023   Reason for consult: Bilateral Heel ulcerations  Attending/Consulting physician: Marita Kansas PA   History of present illness: From Dr Johnna Acosta H&P: " 82 y.o. female  with medical history significant of lung cancer currently on hospice care, DM, CAD, presented to ED with concerns of non-healing foot wounds. Pt is not great hisotrian. From what can be gathered, pt reports chronic hx of foot ulcers, being treated as outpt. Over past two months, wounds did not heal. And more recently, pt began having difficulty standing.  With worsening foot wounds, home care services referred pt to ED for further w/u.   In the ED, xray of L foot with some soft tissue edema. 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 Pt was given dose of broad spec abx, Orthopedic Surgery consulted, and hospitalist consulted for consideration for admission. 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     Cholelithiasis    Chronic back pain    COLONIC POLYPS 09/13/2007   COPD 04/22/2009   DEGENERATIVE JOINT DISEASE 06/15/2007   DEPRESSION 02/22/2008   Depression    Diabetes mellitus (HCC)    Diverticulosis    Fatty liver    Gastritis    Gastroparesis    GERD 10/29/2006   Heart failure with preserved ejection fraction (HCC)    Hyperlipidemia    Hypertension    Migraine    Myocardial infarction (HCC)    Neuropathy    Obesity    OSA (obstructive sleep apnea)     "quit wearing my CPAP" (11/22/2012)   OSTEOPOROSIS 10/29/2006   SEIZURE DISORDER    "silent seizures" (11/22/2012)       Latest Ref Rng & Units 02/21/2023    2:41 PM 02/11/2023    4:30 AM 12/28/2022    8:59 AM  CBC  WBC 4.0 - 10.5 K/uL 14.8  13.5  8.0   Hemoglobin 12.0 - 15.0 g/dL 7.3  9.1  7.5   Hematocrit 36.0 - 46.0 % 23.8  30.0  24.1   Platelets 150 - 400 K/uL 421  358  307        Latest Ref Rng & Units 02/21/2023    2:41 PM 02/11/2023    4:30 AM 12/28/2022    8:59 AM  BMP  Glucose 70 - 99 mg/dL 130  865  784   BUN 8 - 23 mg/dL 11  10  10    Creatinine 0.44 - 1.00 mg/dL 6.96  2.95  2.84   Sodium 135 - 145 mmol/L 132  137  138   Potassium 3.5 - 5.1 mmol/L 4.4  4.0  4.0   Chloride 98 - 111 mmol/L 97  99  102   CO2 22 - 32 mmol/L 25  26  31    Calcium 8.9 - 10.3 mg/dL 7.9  9.4  7.8       Physical Exam: Lower Extremity Exam Vasc: R - PT non palpable, DP non palpable.  L - PT non palpable, DP non palpable.  Derm: R - heel decubitus ulceration, malodor present, crepitus present along achilles, no active drainage has large eschar overlying unstageable. Also  scattered superficial anterior foot/ankle ulceration   L - Heel decubitus ulceration, malodor present unstageable due to overlying eschar. Scattered superficial anterior ankle/foot ulceration.    MSK:  R - Edema crepitus about ulceration posterior Heel  L - Edema   Neuro: R - Gross sensation diminished. Gross motor function intact  L - Gross sensation diminished Gross motor function intact    ASSESSMENT/PLAN OF CARE 82 y.o. female with PMHx significant for lung cancer currently on hospice care, DM, CAD,  with bilateral heel decubitus ulcerations Right worse than Left. Concern for gas gangrene tracking in the soft tissues 6cm proximal to the achilles insertion along the right achilles and underlying osteomyelitis of the posterior right calcaneus. Left heel no XR evidence of OM but suspicion of early OM given chronicity  of ulceration. Also concern for PAD bilaterally.   - Recommend orthopedics consultation for consideration of  right lower extremity below/above knee amputation vs palliative care approach. Given tracking soft tissue emphysema and calc OM feel that the patient would be best served with proximal amputation if intervention is to be pursued. If surgical treatment is desired per patient/family advanced imaging could be considered to further eval for OM  bilaterally. Iodosorb or betadine gauze dressings.  - IV abx broad spectrum  - Anticoagulation: Per primary, would recommend holding in anticipation of possible procedure - Wound care: Recommend betadine wet to dry dressings to bilateral heels - WB status: NWB - Will sign off at this time please call me directly with questions or concerns    Thank you for the consult.  Please contact me directly with any questions or concerns.           Corinna Gab, DPM [facility]    [address] [place], [place] [address]                Office [phone]  Fax [phone]

## 2023-02-21 NOTE — ED Triage Notes (Signed)
Patient arrived by [facility] from home for evaluation of ongoing foot infections. Numerous wounds noted to bilateral feet. Patient is a hospice/DNR and primary MD did not want to wait for hospice to arrive to home. Patient seen for same 10 days ago in ED

## 2023-02-21 NOTE — Progress Notes (Signed)
ED Pharmacy Antibiotic Sign Off An antibiotic consult was received from an ED provider for vancomycin and zosyn per pharmacy dosing for wound infection. A chart review was completed to assess appropriateness.   The following one time order(s) were placed:  Vancomycin 1750 mg IV x 1 Zosyn 3.375 g IV x 1  Further antibiotic and/or antibiotic pharmacy consults should be ordered by the admitting provider if indicated.   Thank you for allowing pharmacy to be a part of this patient's care.   Daylene Posey, [facility]  Clinical Pharmacist 02/21/23 3:49 PM

## 2023-02-21 NOTE — Telephone Encounter (Signed)
Pts daughter called stating both of pts feet are infected and she is diabetic. She said you can smell the flesh and almost see the bone. She was in hospice care and they were to have been taking care of them but they are really bad. She states she is going to try to get pt in but is not sure how she will get her in. I did tell pts daughter that if she could not get her in tomorrow that she should probably get her to the ed. Pts daughter is to call if she is not able to get pt in tomorrow for her appt.

## 2023-02-21 NOTE — ED Provider Notes (Signed)
Accepted handoff at shift change from [facility], [place]. Please see prior provider note for more detail.   Briefly: Patient is 82 y.o. presenting with worsening wounds of bilateral feet.  DDX: concern for wound infection, osteomyelitis, sepsis  Plan: Likely admission for osteomyelitis or wound infection  Physical Exam  BP (!) 94/50   Pulse 72   Temp 98.9 F (37.2 C) (Oral)   Resp 19   SpO2 100%   Physical Exam  Procedures  Procedures  ED Course / MDM   Clinical Course as of 02/21/23 1745  Mon Feb 21, 2023  1502 Worsening wounds in both feet. Worse smell. Intermittent fever. Admit for wound infection. On call podiatrist. Sepsis r/o. [JR]  1627 Right foot xray concerning for osteo. Placed consult to hospital for admission [JR]  1644 Podiatry recommending ortho consult.  [JR]  1707 Discussed patient with Dr. Charlann Boxer of orthopedics.  He recommended to admit for osteomyelitis and orthopedics would see her in consult tomorrow morning. [JR]    Clinical Course User Index [JR] Gareth Eagle, PA-C   Medical Decision Making Amount and/or Complexity of Data Reviewed Labs: ordered. Radiology: ordered. ECG/medicine tests: ordered.  Risk OTC drugs. Prescription drug management. Decision regarding hospitalization.   X-ray of the right foot concerning for osteo myelitis.  There was also evidence of free air on that x-ray as well.  Dr. Annamary Rummage of podiatry advised to consult Ortho as patient may need surgical intervention possible amputation.  Discussed patient with Dr. Charlann Boxer of orthopedics.  Stated for now that it would be appropriate to admit to hospital service for osteomyelitis and diabetic Ortho would consult on patient tomorrow morning to discuss care plan for her.  Patient already received Zosyn and receiving vancomycin.  Admit to hospital service for osteomyelitis with Dr. Rhona Leavens.        Gareth Eagle, PA-C 02/21/23 1806    Durwin Glaze, MD 02/22/23 [phone]

## 2023-02-21 NOTE — ED Provider Notes (Signed)
[place] EMERGENCY DEPARTMENT AT [facility] Provider Note   CSN: [id number] Arrival date & time: 02/21/23  1135     History  No chief complaint on file.   [name] is a 82 y.o. female.  82 year old female presents today for concern of worsening wounds to bilateral feet. Worsening over the past week.  Lives with her daughter.  Daughter states over the past week the wounds have started looking worse, with increased drainage and worsening smell with intermittent fever.  She is on hospice due to cancer but still seeks full medical treatment.  She is DNR.  The history is provided by the patient. No language interpreter was used.       Home Medications Prior to Admission medications   Medication Sig Start Date End Date Taking? Authorizing Provider  albuterol (PROVENTIL HFA) 108 (90 Base) MCG/ACT inhaler Inhale 2 puffs into the lungs every 4 (four) hours as needed for wheezing or shortness of breath. 06/12/17   Shon Hale, MD  amLODipine (NORVASC) 5 MG tablet Take 5 mg by mouth daily.    [provider]  aspirin EC 81 MG tablet Take 1 tablet (81 mg total) by mouth daily. 01/06/23   [place], Eric J, DO  atorvastatin (LIPITOR) 40 MG tablet Take 40 mg by mouth daily.    [provider]  clopidogrel (PLAVIX) 75 MG tablet Take 1 tablet (75 mg total) by mouth daily. 01/06/23   [place], Alvira Philips, DO  fentaNYL (DURAGESIC) 12 MCG/HR Place 1 patch onto the skin every 3 (three) days. 12/14/22   [provider]  furosemide (LASIX) 20 MG tablet Take 40 mg by mouth daily.    [provider]  gabapentin (NEURONTIN) 300 MG capsule TAKE 1 CAPSULE(300 MG) BY MOUTH TWICE DAILY 05/09/18   Romero Belling, MD  hydrALAZINE (APRESOLINE) 25 MG tablet Take 1 tablet (25 mg total) by mouth every 8 (eight) hours. 11/05/22   Lewie Chamber, MD  insulin aspart (NOVOLOG) 100 UNIT/ML injection Inject 0-15 Units into the skin 3 (three) times daily with  meals. Patient not taking: Reported on 12/24/2022 11/05/22   Lewie Chamber, MD  ipratropium-albuterol (DUONEB) 0.5-2.5 (3) MG/3ML SOLN Take 3 mLs by nebulization every 6 (six) hours as needed. 06/12/17   Shon Hale, MD  LANTUS SOLOSTAR 100 UNIT/ML Solostar Pen Inject 10 Units into the skin daily. Patient taking differently: Inject 10 Units into the skin 2 (two) times daily. 11/05/22   Lewie Chamber, MD  levETIRAcetam (KEPPRA) 500 MG tablet Take 500 mg by mouth 2 (two) times daily.    [provider]  LORazepam (ATIVAN) 0.5 MG tablet Take by mouth. 01/21/23   [provider]  metoprolol tartrate (LOPRESSOR) 25 MG tablet Take 0.5 tablets (12.5 mg total) by mouth 2 (two) times daily. 11/05/22   Lewie Chamber, MD  Multiple Vitamin (MULTIVITAMIN WITH MINERALS) TABS tablet Take 1 tablet by mouth daily. 11/05/22   Lewie Chamber, MD  ondansetron (ZOFRAN) 4 MG tablet Take 4 mg by mouth every 6 (six) hours as needed for nausea or vomiting.    [provider]  pantoprazole (PROTONIX) 40 MG tablet Take 1 tablet (40 mg total) by mouth 2 (two) times daily. 12/30/22 03/30/23  [place], Alvira Philips, DO  polyethylene glycol (MIRALAX / GLYCOLAX) 17 g packet Take 17 g by mouth 2 (two) times daily. 12/30/22 03/30/23  [place], Alvira Philips, DO  senna (SENOKOT) 8.6 MG TABS tablet Take 2 tablets (17.2 mg total) by mouth 2 (  two) times daily. 12/30/22 03/30/23  [place], Eric J, DO  SPIRIVA HANDIHALER 18 MCG inhalation capsule Place 1 capsule into inhaler and inhale daily. 12/03/22   [provider]  tamsulosin (FLOMAX) 0.4 MG CAPS capsule Take 1 capsule (0.4 mg total) by mouth daily. 12/30/22 03/30/23  [place], Alvira Philips, DO  traMADol (ULTRAM) 50 MG tablet Take 50 mg by mouth every 6 (six) hours as needed. 12/14/22   [provider]  promethazine (PHENERGAN) 25 MG tablet Take 1 tablet (25 mg total) by mouth every 6 (six) hours as needed for nausea. 06/25/15 06/25/15  Derwood Kaplan, MD       Allergies    Hydrocodone, Lisinopril, Pioglitazone, and Varenicline tartrate    Review of Systems   Review of Systems  Constitutional:  Negative for chills and fever.  Skin:  Positive for wound.  Neurological:  Negative for light-headedness.  All other systems reviewed and are negative.   Physical Exam Updated Vital Signs BP (!) 119/57   Pulse 65   Temp 98.7 F (37.1 C)   Resp 15   SpO2 100%  Physical Exam Vitals and nursing note reviewed.  Constitutional:      General: She is not in acute distress.    Appearance: Normal appearance. She is not ill-appearing.  HENT:     Head: Normocephalic and atraumatic.     Nose: Nose normal.  Eyes:     Conjunctiva/sclera: Conjunctivae normal.  Cardiovascular:     Rate and Rhythm: Normal rate and regular rhythm.  Pulmonary:     Effort: Pulmonary effort is normal. No respiratory distress.  Musculoskeletal:        General: No deformity. Normal range of motion.     Cervical back: Normal range of motion.  Skin:    Findings: No rash.     Comments: Wounds noted to bilateral feet.  Neurological:     Mental Status: She is alert.     ED Results / Procedures / Treatments   Labs (all labs ordered are listed, but only abnormal results are displayed) Labs Reviewed  RESP PANEL BY RT-PCR (RSV, FLU A&B, COVID)  RVPGX2  CULTURE, BLOOD (ROUTINE X 2)  CULTURE, BLOOD (ROUTINE X 2)  COMPREHENSIVE METABOLIC PANEL  CBC WITH DIFFERENTIAL/PLATELET  PROTIME-INR  APTT  URINALYSIS, W/ REFLEX TO CULTURE (INFECTION SUSPECTED)  I-STAT CG4 LACTIC ACID, ED    EKG None  Radiology No results found.  Procedures Procedures    Medications Ordered in ED Medications - No data to display  ED Course/ Medical Decision Making/ A&P Clinical Course as of 02/21/23 1531  Mon Feb 21, 2023  1502 Worsening wounds in both feet. Worse smell. Intermittent fever. Admit for wound infection. On call podiatrist. Sepsis r/o. [JR]    Clinical Course User  Index [JR] Gareth Eagle, PA-C                                 Medical Decision Making Amount and/or Complexity of Data Reviewed Labs: ordered. Radiology: ordered. ECG/medicine tests: ordered.   Medical Decision Making / ED Course   This patient presents to the ED for concern of worsening foot wounds, this involves an extensive number of treatment options, and is a complaint that carries with it a high risk of complications and morbidity.  The differential diagnosis includes wound infection, osteomyelitis  MDM: 82 year old female presents today with worsening wounds to bilateral feet.  She follows with  podiatry outpatient.  Worsening symptoms over the past week.  Will obtain infectious workup as well as bilateral foot x-rays to evaluate for osteomyelitis.  Will start broad-spectrum antibiotics.  Pending blood work at the time of shift change.  Signed out to oncoming provider.  Will require admission with blood work results.  I did notify podiatry via secure chat that patient was in the hospital and will likely require admission.   Lab Tests: -I ordered, reviewed, and interpreted labs.   The pertinent results include:   Labs Reviewed  RESP PANEL BY RT-PCR (RSV, FLU A&B, COVID)  RVPGX2  CULTURE, BLOOD (ROUTINE X 2)  CULTURE, BLOOD (ROUTINE X 2)  COMPREHENSIVE METABOLIC PANEL  CBC WITH DIFFERENTIAL/PLATELET  PROTIME-INR  APTT  URINALYSIS, W/ REFLEX TO CULTURE (INFECTION SUSPECTED)  I-STAT CG4 LACTIC ACID, ED      EKG  EKG Interpretation Date/Time:    Ventricular Rate:    PR Interval:    QRS Duration:    QT Interval:    QTC Calculation:   R Axis:      Text Interpretation:           Imaging Studies ordered: I ordered imaging studies including bilateral foot x-ray I independently visualized and interpreted imaging. I agree with the radiologist interpretation   Medicines ordered and prescription drug management: No orders of the defined types were placed in  this encounter.   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     Cholelithiasis    Chronic back pain    COLONIC POLYPS 09/13/2007   COPD 04/22/2009   DEGENERATIVE JOINT DISEASE 06/15/2007   DEPRESSION 02/22/2008   Depression    Diabetes mellitus (HCC)    Diverticulosis    Fatty liver    Gastritis    Gastroparesis    GERD 10/29/2006   Heart failure with preserved ejection fraction (HCC)    Hyperlipidemia    Hypertension    Migraine    Myocardial infarction (HCC)    Neuropathy    Obesity    OSA (obstructive sleep apnea)    "quit wearing my CPAP" (11/22/2012)   OSTEOPOROSIS 10/29/2006   SEIZURE DISORDER    "silent seizures" (11/22/2012)      Dispostion: Patient signed out to oncoming provider to follow-up on blood work.  She will likely require admission.  Final Clinical Impression(s) / ED Diagnoses Final diagnoses:  Wound infection    Rx / DC Orders ED Discharge Orders     None         Marita Kansas, PA-C 02/21/23 1532    Ernie Avena, MD 02/24/23 1147

## 2023-02-21 NOTE — ED Notes (Signed)
Pt refusing IV access at this time.

## 2023-02-21 NOTE — Progress Notes (Signed)
Pharmacy Antibiotic Note  [name] is a 82 y.o. female admitted on 02/21/2023 with  wound infection with osteomyelitis and gangrene .  Pharmacy has been consulted for Vancomycin dosing. Vancomycin 1750mg  IV x1 given in ED. WBC 14.8. Afebrile.   Plan: Vancomycin 1250 mg IV every 24 hours. Continue Zosyn 3.375g IV q8h -EI. Continue Clindamycin 900mg  IV q8h Monitor renal function, culture results, and clinical status for ability to narrow therapy    Temp (24hrs), Avg:98.6 F (37 C), Min:98.3 F (36.8 C), Max:98.9 F (37.2 C)  Recent Labs  Lab 02/21/23 1441 02/21/23 1523 02/21/23 1609  WBC 14.8*  --   --   CREATININE 0.73  --   --   LATICACIDVEN  --  1.3 0.5    Estimated Creatinine Clearance: 70.4 mL/min (by C-G formula based on SCr of 0.73 mg/dL).    Allergies  Allergen Reactions   Hydrocodone Other (See Comments)    Hallucination   Lisinopril Cough   Pioglitazone Swelling    edema   Varenicline Tartrate Other (See Comments)    Bad dreams    Antimicrobials this admission: Zosyn 11/4 >> Vancomycin 11/4 >> Clindamycin 11/4 >>  Dose adjustments this admission:   Microbiology results: 11/4 BCx:   Thank you for allowing pharmacy to be a part of this patient's care.  Link Snuffer, PharmD, BCPS, BCCCP Please refer to [facility] for [facility] Pharmacy numbers 02/21/2023 10:33 PM
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
## 2023-02-21 NOTE — ED Notes (Signed)
Phlebotomist states she will work on the 2nd set of blood cultures.

## 2023-02-21 NOTE — Patient Care Conference (Signed)
Tried calling pt's daughter, Oda Cogan at number listed multiple times. Each time without answer. Called floor to talk to family, however RN states family stepped away. Called daughter's phone again, still without answer. Will try again later

## 2023-02-22 ENCOUNTER — Ambulatory Visit: Payer: Medicare PPO | Admitting: Podiatry

## 2023-02-22 DIAGNOSIS — Z7189 Other specified counseling: Secondary | ICD-10-CM | POA: Diagnosis not present

## 2023-02-22 DIAGNOSIS — L089 Local infection of the skin and subcutaneous tissue, unspecified: Principal | ICD-10-CM

## 2023-02-22 DIAGNOSIS — T148XXA Other injury of unspecified body region, initial encounter: Secondary | ICD-10-CM | POA: Diagnosis not present

## 2023-02-22 DIAGNOSIS — M869 Osteomyelitis, unspecified: Secondary | ICD-10-CM | POA: Diagnosis not present

## 2023-02-22 DIAGNOSIS — Z515 Encounter for palliative care: Secondary | ICD-10-CM

## 2023-02-22 DIAGNOSIS — A480 Gas gangrene: Secondary | ICD-10-CM

## 2023-02-22 DIAGNOSIS — E1169 Type 2 diabetes mellitus with other specified complication: Secondary | ICD-10-CM | POA: Diagnosis not present

## 2023-02-22 LAB — BLOOD CULTURE ID PANEL (REFLEXED) - BCID2

## 2023-02-22 LAB — GLUCOSE, CAPILLARY
Glucose-Capillary: 127 mg/dL — ABNORMAL HIGH (ref 70–99)
Glucose-Capillary: 184 mg/dL — ABNORMAL HIGH (ref 70–99)
Glucose-Capillary: 184 mg/dL — ABNORMAL HIGH (ref 70–99)
Glucose-Capillary: 178 mg/dL — ABNORMAL HIGH (ref 70–99)

## 2023-02-22 MED ORDER — INFLUENZA VAC A&B SURF ANT ADJ 0.5 ML IM SUSY
0.5000 mL | PREFILLED_SYRINGE | INTRAMUSCULAR | Status: DC
Start: 2023-02-23 — End: 2023-02-24
  Filled 2023-02-22: qty 0.5

## 2023-02-22 MED ORDER — TRAMADOL HCL 50 MG PO TABS: 50.0000 mg | ORAL_TABLET | Freq: Four times a day (QID) | ORAL | Status: DC | PRN

## 2023-02-22 MED ORDER — HYDROMORPHONE HCL 1 MG/ML IJ SOLN
0.5000 mg | INTRAMUSCULAR | Status: DC | PRN
Start: 2023-02-22 — End: 2023-02-22
  Administered 2023-02-22: 1 mg via INTRAVENOUS
  Filled 2023-02-22: qty 1

## 2023-02-22 MED ORDER — CLOPIDOGREL BISULFATE 75 MG PO TABS
75.0000 mg | ORAL_TABLET | Freq: Every day | ORAL | Status: DC
  Administered 2023-02-22 – 2023-02-24 (×3): 75 mg via ORAL
  Filled 2023-02-22 (×3): qty 1

## 2023-02-22 MED ORDER — GABAPENTIN 300 MG PO CAPS
300.0000 mg | ORAL_CAPSULE | Freq: Two times a day (BID) | ORAL | Status: DC
  Administered 2023-02-22 – 2023-02-24 (×5): 300 mg via ORAL
  Filled 2023-02-22 (×5): qty 1

## 2023-02-22 MED ORDER — OXYCODONE HCL 5 MG PO TABS: 5.0000 mg | ORAL_TABLET | ORAL | Status: DC | PRN

## 2023-02-22 MED ORDER — ASPIRIN 81 MG PO TBEC
81.0000 mg | DELAYED_RELEASE_TABLET | Freq: Every day | ORAL | Status: DC
  Administered 2023-02-22 – 2023-02-24 (×3): 81 mg via ORAL
  Filled 2023-02-22 (×3): qty 1

## 2023-02-22 MED ORDER — LORAZEPAM 0.5 MG PO TABS
0.5000 mg | ORAL_TABLET | Freq: Two times a day (BID) | ORAL | Status: DC | PRN
  Administered 2023-02-22: 0.5 mg via ORAL
  Filled 2023-02-22: qty 1

## 2023-02-22 MED ORDER — PANTOPRAZOLE SODIUM 40 MG PO TBEC
40.0000 mg | DELAYED_RELEASE_TABLET | Freq: Two times a day (BID) | ORAL | Status: DC
  Administered 2023-02-22 – 2023-02-24 (×5): 40 mg via ORAL
  Filled 2023-02-22 (×5): qty 1

## 2023-02-22 MED ORDER — HYDROMORPHONE HCL 1 MG/ML IJ SOLN
0.5000 mg | INTRAMUSCULAR | Status: DC | PRN
  Administered 2023-02-24: 1 mg via INTRAVENOUS
  Filled 2023-02-22: qty 1

## 2023-02-22 MED ORDER — ATORVASTATIN CALCIUM 40 MG PO TABS
40.0000 mg | ORAL_TABLET | Freq: Every day | ORAL | Status: DC
  Administered 2023-02-22 – 2023-02-24 (×3): 40 mg via ORAL
  Filled 2023-02-22 (×3): qty 1

## 2023-02-22 MED ORDER — AMLODIPINE BESYLATE 5 MG PO TABS
5.0000 mg | ORAL_TABLET | Freq: Every day | ORAL | Status: DC
  Administered 2023-02-22 – 2023-02-24 (×3): 5 mg via ORAL
  Filled 2023-02-22 (×3): qty 1

## 2023-02-22 MED ORDER — FENTANYL 12 MCG/HR TD PT72
1.0000 | MEDICATED_PATCH | TRANSDERMAL | Status: DC
  Administered 2023-02-22: 1 via TRANSDERMAL
  Filled 2023-02-22: qty 1

## 2023-02-22 MED ORDER — UMECLIDINIUM BROMIDE 62.5 MCG/ACT IN AEPB
1.0000 | INHALATION_SPRAY | Freq: Every day | RESPIRATORY_TRACT | Status: DC
  Administered 2023-02-23 – 2023-02-24 (×2): 1 via RESPIRATORY_TRACT
  Filled 2023-02-22: qty 7

## 2023-02-22 MED ORDER — TAMSULOSIN HCL 0.4 MG PO CAPS
0.4000 mg | ORAL_CAPSULE | Freq: Every day | ORAL | Status: DC
  Administered 2023-02-22 – 2023-02-24 (×3): 0.4 mg via ORAL
  Filled 2023-02-22 (×3): qty 1

## 2023-02-22 MED ORDER — LINEZOLID 600 MG/300ML IV SOLN
600.0000 mg | Freq: Two times a day (BID) | INTRAVENOUS | Status: DC
  Administered 2023-02-22 – 2023-02-23 (×3): 600 mg via INTRAVENOUS
  Filled 2023-02-22 (×4): qty 300

## 2023-02-22 MED ORDER — ORAL CARE MOUTH RINSE: 15.0000 mL | OROMUCOSAL | Status: DC | PRN

## 2023-02-22 MED ORDER — TIOTROPIUM BROMIDE MONOHYDRATE 18 MCG IN CAPS
1.0000 | ORAL_CAPSULE | Freq: Every day | RESPIRATORY_TRACT | Status: DC
Start: 2023-02-22 — End: 2023-02-22
  Filled 2023-02-22: qty 5

## 2023-02-22 MED ORDER — OXYCODONE HCL 5 MG PO TABS
10.0000 mg | ORAL_TABLET | ORAL | Status: DC | PRN
  Administered 2023-02-23 – 2023-02-24 (×2): 10 mg via ORAL
  Filled 2023-02-22 (×2): qty 2

## 2023-02-22 MED ORDER — LEVETIRACETAM 500 MG PO TABS
500.0000 mg | ORAL_TABLET | Freq: Two times a day (BID) | ORAL | Status: DC
  Administered 2023-02-22 – 2023-02-24 (×5): 500 mg via ORAL
  Filled 2023-02-22 (×5): qty 1

## 2023-02-22 MED ORDER — METOPROLOL TARTRATE 12.5 MG HALF TABLET
12.5000 mg | ORAL_TABLET | Freq: Two times a day (BID) | ORAL | Status: DC
  Administered 2023-02-22 – 2023-02-24 (×4): 12.5 mg via ORAL
  Filled 2023-02-22 (×4): qty 1

## 2023-02-22 MED ORDER — FUROSEMIDE 40 MG PO TABS
40.0000 mg | ORAL_TABLET | Freq: Every day | ORAL | Status: DC
  Administered 2023-02-22 – 2023-02-24 (×3): 40 mg via ORAL
  Filled 2023-02-22 (×3): qty 1

## 2023-02-22 NOTE — Progress Notes (Signed)
SLP Cancellation Note  Patient Details Name: [name] MRN: [id number] DOB: 26-Dec-1940   Cancelled treatment:       Reason Eval/Treat Not Completed: SLP screened, no needs identified, will sign off. SLP orders received for cognitive-linguistic assessment. Multiple family members at bedside declined need for cognitive assessment at this time. They reported pt has age-related cognitive deficits at baseline and do not feel that a cognitive assessment or intervention will change this. Furthermore, pt does not have an acute neurological diagnoses. No heat CT or MRI completed. No SLP intervention indicated at this time. SLP will sign off.    Ellery Plunk 02/22/2023, 12:11 PM

## 2023-02-22 NOTE — Progress Notes (Signed)
PHARMACY - PHYSICIAN COMMUNICATION CRITICAL VALUE ALERT - BLOOD CULTURE IDENTIFICATION (BCID)  M[place] is an 82 y.o. female who presented to [facility] on 02/21/2023 with a chief complaint of RLE gangrene with OM.   Assessment:  blood cultures 1 of 3 growing staph epi, methicillin resistant. Possible contaminant. No changes recommended   Name of physician (or Provider) Contacted: Johann Capers  Current antibiotics: piperacillin/tazobactam   Changes to prescribed antibiotics recommended:  Patient is on recommended antibiotics - No changes needed  No results found for this or any previous visit.  Leander Rams 02/22/2023  10:23 PM

## 2023-02-22 NOTE — Plan of Care (Signed)
Patient did well today, family to discuss patient going home with hospice and managing symptoms.
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
## 2023-02-22 NOTE — TOC Initial Note (Addendum)
Transition of Care [facility]) - Initial/Assessment Note    Patient Details  Name: [name] MRN: [id number] Date of Birth: 13-Mar-1941  Transition of [facility]) CM/SW Contact:    Carley Hammed, LCSW Phone Number: 02/22/2023, 12:04 PM  Clinical Narrative:                 CSW reviewed chart and confirmed that pt is from home with hospice services through [facility]. Jenn from [facility] notes they are following for disposition depending on pt's treatment course. Ortho and Palliative also following. TOC to defer at this time and will continue to follow to assist with disposition needs.   Family met with [facility], debating if they want to switch agencies. Declining amputations at this time.  Expected Discharge Plan:  (TBD) Barriers to Discharge: Continued Medical Work up, Other (must enter comment) (TOC following for medical plan and disposition.)   Patient Goals and CMS Choice            Expected Discharge Plan and Services In-house Referral: Hospice / Palliative Care, Clinical Social Work   Post Acute Care Choice:  (TBD) Living arrangements for the past 2 months: Single Family Home                                      Prior Living Arrangements/Services Living arrangements for the past 2 months: Single Family Home Lives with:: Adult Children          Need for Family Participation in Patient Care: Yes (Comment) Care giver support system in place?: Yes (comment) Current home services: DME, Hospice Criminal Activity/Legal Involvement Pertinent to Current Situation/Hospitalization: No - Comment as needed  Activities of Daily Living   ADL Screening (condition at time of admission) Independently performs ADLs?: No Does the patient have a NEW difficulty with bathing/dressing/toileting/self-feeding that is expected to last >3 days?: Yes (Initiates electronic notice to provider for possible OT consult) Does the patient have a NEW difficulty with  getting in/out of bed, walking, or climbing stairs that is expected to last >3 days?: Yes (Initiates electronic notice to provider for possible PT consult) Does the patient have a NEW difficulty with communication that is expected to last >3 days?: Yes (Initiates electronic notice to provider for possible SLP consult) Is the patient deaf or have difficulty hearing?: No Does the patient have difficulty seeing, even when wearing glasses/contacts?: Yes Does the patient have difficulty concentrating, remembering, or making decisions?: No  Permission Sought/Granted                  Emotional Assessment       Orientation: : Oriented to Self, Oriented to Place, Oriented to  Time, Oriented to Situation Alcohol / Substance Use: Not Applicable Psych Involvement: No (comment)  Admission diagnosis:  Diabetic osteomyelitis (HCC) [E11.69, M86.9] Wound infection [T14.8XXA, L08.9] Patient Active Problem List   Diagnosis Date Noted   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   Wound infection 02/22/2023   Diabetic osteomyelitis (HCC) 02/21/2023   Hematochezia 12/27/2022   Acute on chronic anemia 12/27/2022   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 lung (HCC) 12/13/2022   Acute respiratory failure with hypoxia and hypercapnia (HCC) 11/03/2022   OSA (obstructive sleep apnea) 11/03/2022   History of epilepsy 11/03/2022   Acute urinary retention 11/03/2022   Acute cystitis 11/03/2022   Type 2 diabetes  mellitus (HCC) 10/28/2022   Epilepsy (HCC) 10/28/2022   CAD (coronary artery disease) 10/28/2022   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) 10/28/2022   Pain due to onychomycosis of toenails of both feet 04/20/2022   Generalized weakness    Seizure (HCC)    AMS (altered mental status) 04/20/2019   Hypoglycemia    Critical lower limb ischemia (HCC) 10/28/2017   PNA (pneumonia) 06/08/2017   Epigastric pain 05/27/2017   Vitamin D  deficiency 02/15/2017   Cough 05/26/2016   Abnormal chest xray 03/08/2016   Contusion of left chest wall 12/29/2015   Diabetes (HCC) 06/20/2015   Sleep disorder 06/20/2015   Screening for cancer 06/20/2015   Anemia 05/22/2015   CAP (community acquired pneumonia) 05/06/2015   AKI (acute kidney injury) (HCC) 05/06/2015   Nausea vomiting and diarrhea 05/06/2015   Essential hypertension 05/06/2015   Diabetes mellitus with complication (HCC) 05/06/2015   Dependent edema 05/06/2015   HLD (hyperlipidemia) 05/06/2015   Wellness examination 07/05/2014   Chronic left hip pain 11/27/2013   Chronic pain syndrome 06/27/2013   Renal insufficiency 03/19/2013   Routine general medical examination at a health care facility 03/19/2013   Encounter for long-term (current) use of other medications 03/09/2013   Chronic diastolic CHF (congestive heart failure) (HCC) 08/10/2012   Foot pain 02/22/2012   History of colonic polyps 08/17/2011   Edema 06/28/2011   Gastroparesis 03/15/2011   Low back pain 01/12/2011   Cramp of limb 10/13/2010   INGROWN TOENAIL 06/23/2010   KNEE PAIN, RIGHT 02/03/2010   Convulsions (HCC) 01/06/2010   HYPERSOMNIA, ASSOCIATED WITH SLEEP APNEA 05/07/2009   OTHER DYSPNEA AND RESPIRATORY ABNORMALITIES 04/23/2009   COPD 04/22/2009   HYPERCHOLESTEROLEMIA 02/25/2009   HYPERTENSION 12/20/2008   Hemiplegia, late effect of cerebrovascular disease (HCC) 12/20/2008   SMOKER 02/22/2008   DEPRESSION 02/22/2008   DYSPNEA 02/22/2008   Chest wall pain 02/22/2008   ASYMPTOMATIC POSTMENOPAUSAL STATUS 02/22/2008   HYPOKALEMIA 01/15/2008   COLONIC POLYPS 09/13/2007   DEGENERATIVE JOINT DISEASE 06/15/2007   ALLERGIC RHINITIS 10/29/2006   GERD 10/29/2006   Hepatic cirrhosis (HCC) 10/29/2006   Osteoporosis 10/29/2006   PCP:  Raymon Mutton., FNP Pharmacy:   [id number] - [place], [place] - [address] AT [facility] OF [address] [place] [place] [address] Phone: [phone] Fax: [phone]     Social Determinants of Health (SDOH) Social History: SDOH Screenings   Food Insecurity: No Food Insecurity (02/21/2023)  Housing: Patient Declined (02/21/2023)  Transportation Needs: No Transportation Needs (02/21/2023)  Utilities: Not At Risk (02/21/2023)  Alcohol Screen: Low Risk  (06/07/2022)  Physical Activity: Inactive (06/07/2022)  Tobacco Use: High Risk (02/21/2023)   SDOH Interventions:     Readmission Risk Interventions    12/29/2022    3:15 PM 11/02/2022    2:16 PM  Readmission Risk Prevention Plan  Transportation Screening Complete Complete  PCP or Specialist Appt within 5-7 Days  Complete  PCP or Specialist Appt within 3-5 Days Complete   Home Care Screening  Complete  Medication Review (RN CM)  Complete  HRI or Home Care Consult Complete   Social Work Consult for Recovery Care Planning/Counseling Complete   Palliative Care Screening Complete   Medication Review Oceanographer) Complete

## 2023-02-22 NOTE — Hospital Course (Signed)
82 y.o. female with medical history significant of lung cancer currently on hospice care, DM, CAD, presented to ED with concerns of non-healing foot wounds. Pt is not great hisotrian. From what can be gathered, pt reports chronic hx of foot ulcers, being treated as outpt. Over past two months, wounds did not heal. And more recently, pt began having difficulty standing.  With worsening foot wounds, home care services referred pt to ED for further w/u   In the ED, xray of L foot with some soft tissue edema. 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

## 2023-02-22 NOTE — Progress Notes (Signed)
  Progress Note   Patient: [name]:[id number] DOB: Aug 15, 1940 DOA: 02/21/2023     1 DOS: the patient was seen and examined on 02/22/2023   Brief hospital course: 82 y.o. female with medical history significant of lung cancer currently on hospice care, DM, CAD, presented to ED with concerns of non-healing foot wounds. Pt is not great hisotrian. From what can be gathered, pt reports chronic hx of foot ulcers, being treated as outpt. Over past two months, wounds did not heal. And more recently, pt began having difficulty standing.  With worsening foot wounds, home care services referred pt to ED for further w/u   In the ED, xray of L foot with some soft tissue edema. 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  Assessment and Plan: R foot gangrene with osteomyelitis -Xray findings noted. Concerning for gangrene of soft tissue and osteo in R foot -Will continue pt on broad spec abx -blood cx neg thus far -Pt seen by Orthopedic Surgery. After speaking with Ortho, family had decided against surgery and instead agreed upon hospice care -Palliative Care following. Family currently deciding about which hospice agency to pursue. TOC following -Have reached out to ID regarding abx recommendations moving forward   Hx Iron deficiency anemia Hgb 7.3 at time of presentation Remains hemodynamically stable   Chronic hypoxic respiratory failure on 2 L nasal cannula at baseline Obstructive sleep apnea Not in acute exacerbation at this time -Cont home o2 as needed -Pt historically does not tolerate cpap   Chronic diastolic congestive heart failure, compensated TTE 10/29/2022 with LVEF 55 to 60%, no regional LV wall motion normalities, mild LVH, LV diastolic function could not be evaluated, no aortic stenosis.  -Continue lasix as tolerated, cont metoprolol -resume ASA, plavix    Hx epilepsy Would cont Keppra 500 mg p.o. twice  daily   Essential hypertension -cont norvasc, metoprolol per home regimen   Hyperlipidemia Cont home med once confirmed by pharmacy   Morbid obesity      Subjective: Without complaints this AM  Physical Exam: Vitals:   02/21/23 2258 02/22/23 0148 02/22/23 0537 02/22/23 1648  BP:  (!) 125/59 119/61 118/64  Pulse:  70 73 78  Resp:  18 18 17   Temp:  98.3 F (36.8 C) 98.5 F (36.9 C) (!) 97.5 F (36.4 C)  TempSrc:  Oral Oral   SpO2:  100% 100% 98%  Weight: 124.6 kg     Height: 5' 5.98" (1.676 m)      General exam: Awake, laying in bed, in nad Respiratory system: Normal respiratory effort, no wheezing Cardiovascular system: regular rate, s1, s2 Gastrointestinal system: Soft, nondistended, positive BS Central nervous system: CN2-12 grossly intact, strength intact Extremities: BLE ulcerations with foul smell Skin: Normal skin turgor, no notable skin lesions seen Psychiatry: Mood normal // no visual hallucinations   Data Reviewed:  There are no new results to review at this time.  Family Communication: Pt in room, family at bedside  Disposition: Status is: Inpatient Remains inpatient appropriate because: severity of illness  Planned Discharge Destination: Home    Author: Rickey Barbara, MD 02/22/2023 5:47 PM  For on call review [email or web address].

## 2023-02-22 NOTE — Consult Note (Signed)
Palliative Medicine Inpatient Consult Note  Consulting Provider:  Jerald Kief, MD   Reason for consult:   Palliative Care Consult Services Palliative Medicine Consult  Reason for Consult? GOC   Hx lung cancer. Here with foot gangrene and osteo. Ortho initially recommended amputation, but they decline. Ortho recommends comfort/hospice   02/22/2023  HPI:  Per intake H&P --> [name] is a 82 y.o. female with medical history significant of lung cancer currently on hospice care, DM, CAD, presented to ED with concerns of non-healing foot wounds. Pt is not great hisotrian. From what can be gathered, pt reports chronic hx of foot ulcers, being treated as outpt.   The Palliative care team has been asked to get involved to further assist with goals of care conversation.  [name] is established with [facility] hospice as an outpatient.   Clinical Assessment/Goals of Care:  *Please note that this is a verbal dictation therefore any spelling or grammatical errors are due to the "Dragon Medical One" system interpretation.  I have reviewed medical records including EPIC notes, labs and imaging, received report from bedside RN, assessed the patient who is lying in bed eating a sub.    I met with [name], her daughters [name], granddaughter, [name], and nephew, [name] to further discuss diagnosis prognosis, GOC, EOL wishes, disposition and options.   I introduced Palliative Medicine as specialized medical care for people living with serious illness. It focuses on providing relief from the symptoms and stress of a serious illness. The goal is to improve quality of life for both the patient and the family.  Medical History Review and Understanding:  Reviewed Avari's past medical history significant for lung cancer,  DM, CAD, COPD, obstructive sleep apnea, chronic back pain, multiple CVAs, heart failure, and hypertension.  Social History:  [name] is from [facility].  She is a widow.  She has 5 children-2 sons and 3 daughters.  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  She is a woman who loves spending time with her family.  She is a woman who has great faith and is [place].   Preceding hospitalization [name] was living in a single-family home.  She was able to get up into her wheelchair.  Her daughter [name] lives across the street.  [name] does have a good appetite.  Advance Directives:  A detailed discussion was had today regarding advanced directives.  Patient's children are her surrogate decision makers.  Code Status:  Concepts specific to code status, artifical feeding and hydration, continued IV antibiotics and rehospitalization was had.  The difference between a aggressive medical intervention path  and a palliative comfort care path for this patient at this time was had.   MOST reviewed as below:  Cardiopulmonary Resuscitation: Do Not Attempt Resuscitation (DNR/No CPR)  Medical Interventions: Comfort Measures: Keep clean, warm, and dry. Use medication by any route, positioning, wound care, and other measures to relieve pain and suffering. Use oxygen, suction and manual treatment of airway obstruction as needed for comfort. Do not transfer to the hospital unless comfort needs cannot be met in current location.  Antibiotics: Determine use of limitation of antibiotics when infection occurs  IV Fluids: No IV fluids (provide other measures to ensure comfort)  Feeding Tube: No feeding tube   Discussion:  Patient is aware of her BLE heal ulcers and the  thought of amputation. She and I reviewed that amputation(s) deviate from the focus of dignity and quality at the end of life which is very much the philosophy of hospice. [name] is aware of this she and I discussed the possible outcomes with or  without an amputation - I shared very openly and honestly that she is not a good candidate for wound healing and I would worry additional about her anesthesia risk. We discussed that she could pass on the OR table. She shares awareness of this.  I spoke to [name] and her daughters further and the patient is clear that she does not want an amputation. She has in fact never wanted an amputation but she has not felt supported.   [name] shares feeling unsupported by her present hospice agency and that her nurse has not made attempts to provide appropriate pain management. I asked her if we could optimize this perhaps through consideration of another hospice agency. Patient and her daughter are interested in meeting with hospice and share the preference of [facility] [place].   Discussed the importance of continued conversation with family and their  medical providers regarding overall plan of care and treatment options, ensuring decisions are within the context of the patients values and GOCs.  Decision Maker: [name] (Daughter): [phone] (Mobile)   SUMMARY OF RECOMMENDATIONS   DNAR/DNI  Patient does not want an amputation  Optimize pain management: - Fentanyl patch 108mcg/hr --> Plan to increase to in the oncoming days once calculate prn needs - Change Oxycodone 5mg /10mg  PO Q4H PRN Moderate - Severe pain - DC Tramadol - Dilaudid 0.5-1mg  Q3H PRN severe breakthrough pain - Gabapentin 300mg  BID  Appreciate TOC helping arranged a meeting with [facility] [place]  Ongoing PMT support  Code Status/Advance Care Planning: DNAR/DNI  Palliative Prophylaxis:  Aspiration, Bowel Regimen, Delirium Protocol, Frequent Pain Assessment, Oral Care, Palliative Wound Care, and Turn Reposition  Additional Recommendations (Limitations, Scope, Preferences): Continue current care  Psycho-social/Spiritual:  Desire for further Chaplaincy support: Yes Additional  Recommendations: Education on EOL care   Prognosis: Limited overall, hospice continuation is appropriate with patients underlying disease burden.  Discharge Planning: Discharge back home with hospice once optimized.  Vitals:   02/22/23 0148 02/22/23 0537  BP: (!) 125/59 119/61  Pulse: 70 73  Resp: 18 18  Temp: 98.3 F (36.8 C) 98.5 F (36.9 C)  SpO2: 100% 100%    Intake/Output Summary (Last 24 hours) at 02/22/2023 1157 Last data filed at 02/22/2023 0900 Gross per 24 hour  Intake 793.1 ml  Output --  Net 793.1 ml   Last Weight  Most recent update: 02/21/2023 10:59 PM    Weight  124.6 kg (274 lb 11.1 oz)            Gen:  Elderly AA F in mild distress HEENT: moist mucous membranes CV: Regular rate and rhythm  PULM:  On 2LPM [place] breathing is not labored ABD: soft/nontender  EXT: BLE edema  Neuro: Alert and oriented x3   PPS: 40%   This conversation/these recommendations were discussed with patient primary care team, Dr. Rhona Leavens  Billing based on MDM: High  Problems Addressed: One acute or chronic illness or injury that poses a threat to life or bodily function  Amount and/or Complexity of Data: Category 3:Discussion of management or test interpretation with external physician/other qualified health care professional/appropriate source (not separately reported)  Risks: Decision regarding hospitalization or escalation of hospital care and Decision not to resuscitate or  to de-escalate care because of poor prognosis ______________________________________________________ Lamarr Lulas [facility] Palliative Medicine Team Team Cell Phone: [phone] Please utilize secure chat with additional questions, if there is no response within 30 minutes please call the above phone number  Palliative Medicine Team providers are available by phone from 7am to 7pm daily and can be reached through the team cell phone.  Should this patient require assistance outside of these hours,  please call the patient's attending physician.

## 2023-02-23 DIAGNOSIS — Z7189 Other specified counseling: Secondary | ICD-10-CM | POA: Diagnosis not present

## 2023-02-23 DIAGNOSIS — Z515 Encounter for palliative care: Secondary | ICD-10-CM

## 2023-02-23 DIAGNOSIS — E1169 Type 2 diabetes mellitus with other specified complication: Secondary | ICD-10-CM | POA: Diagnosis not present

## 2023-02-23 DIAGNOSIS — M869 Osteomyelitis, unspecified: Secondary | ICD-10-CM | POA: Diagnosis not present

## 2023-02-23 LAB — GLUCOSE, CAPILLARY
Glucose-Capillary: 150 mg/dL — ABNORMAL HIGH (ref 70–99)
Glucose-Capillary: 199 mg/dL — ABNORMAL HIGH (ref 70–99)
Glucose-Capillary: 170 mg/dL — ABNORMAL HIGH (ref 70–99)
Glucose-Capillary: 199 mg/dL — ABNORMAL HIGH (ref 70–99)

## 2023-02-23 MED ORDER — LINEZOLID 600 MG PO TABS
600.0000 mg | ORAL_TABLET | Freq: Two times a day (BID) | ORAL | Status: DC
  Administered 2023-02-23 – 2023-02-24 (×2): 600 mg via ORAL
  Filled 2023-02-23 (×3): qty 1

## 2023-02-23 NOTE — Progress Notes (Signed)
   Palliative Medicine Inpatient Follow Up Note   HPI: [name] is a 82 y.o. female with medical history significant of lung cancer currently on hospice care, DM, CAD, presented to ED with concerns of non-healing foot wounds. Pt is not great hisotrian. From what can be gathered, pt reports chronic hx of foot ulcers, being treated as outpt.    The Palliative care team has been asked to get involved to further assist with goals of care conversation.   [place] is established with [facility] hospice as an outpatient.   Today's Discussion 02/23/2023  *Please note that this is a verbal dictation therefore any spelling or grammatical errors are due to the "Dragon Medical One" system interpretation.  Chart reviewed inclusive of vital signs, progress notes, laboratory results, and diagnostic images.   I checked in with [place] this morning. Created space and opportunity for patient to explore thoughts feelings and fears regarding current medical situation.  Reviewed patients present disease burden.  Per communication with [facility] plan for patient to transition to [facility] [place].  Anticipated discharge tomorrow.   Questions and concerns addressed/Palliative Support Provided.   Objective Assessment: Vital Signs Vitals:   02/23/23 0843 02/23/23 0845  BP:  (!) 133/50  Pulse:  80  Resp:    Temp:    SpO2: 99%     Intake/Output Summary (Last 24 hours) at 02/23/2023 1538 Last data filed at 02/23/2023 1200 Gross per 24 hour  Intake 1976.85 ml  Output 2050 ml  Net -73.15 ml   Last Weight  Most recent update: 02/21/2023 10:59 PM    Weight  124.6 kg (274 lb 11.1 oz)            Gen:  Elderly AA F in mild distress HEENT: moist mucous membranes CV: Regular rate and rhythm  PULM:  On 2LPM [place] breathing is not labored ABD: soft/nontender  EXT: BLE edema  Neuro: Alert and oriented x3   SUMMARY OF RECOMMENDATIONS   DNAR/DNI   Patient does not want an amputation    Optimize pain management: - Fentanyl patch 34mcg/hr --> Has not needed additional PRNs - Change Oxycodone 5mg /10mg  PO Q4H PRN Moderate - Severe pain - Dilaudid 0.5-1mg  Q3H PRN severe breakthrough pain - Gabapentin 300mg  BID   Transition home tomorrow with [facility] based on MDM: Moderate ______________________________________________________________________________________ Lamarr Lulas [place] Palliative Medicine Team Team Cell Phone: [phone] Please utilize secure chat with additional questions, if there is no response within 30 minutes please call the above phone number  Palliative Medicine Team providers are available by phone from 7am to 7pm daily and can be reached through the team cell phone.  Should this patient require assistance outside of these hours, please call the patient's attending physician.

## 2023-02-23 NOTE — Progress Notes (Signed)
OT Cancellation Note  Patient Details Name: [name] MRN: [id number] DOB: [birth year]   Cancelled Treatment:    Reason Eval/Treat Not Completed: OT screened, no needs identified, will sign off OT speaking with PT who discussed plan of care with RN. Patient is from home with hospice, is w/c bound at baseline, and family is declining surgical intervention. OT evaluation not needed at this time from an acute perspective. OT signing off; please re-consult if further needs arise.  Pollyann Glen E. Sarahanne Novakowski, OTR/L Acute Rehabilitation Services [phone]   Cherlyn Cushing 02/23/2023, 1:59 PM

## 2023-02-23 NOTE — TOC Progression Note (Signed)
Transition of Care [facility]) - Progression Note    Patient Details  Name: [name] MRN: [id number] Date of Birth: 22-Dec-1940  Transition of Care [facility]) CM/SW Contact  Carley Hammed, LCSW Phone Number: 02/23/2023, 2:02 PM  Clinical Narrative:    CSW advised that family has decided to proceed with [facility] [place]. HOP working on setting up services and equipment, plan will be to DC tomorrow back home. DNR signed on chart and medical team notified. TOC will continue to follow.    Expected Discharge Plan:  (TBD) Barriers to Discharge: Continued Medical Work up, Other (must enter comment) (TOC following for medical plan and disposition.)  Expected Discharge Plan and Services In-house Referral: Hospice / Palliative Care, Clinical Social Work   Post Acute Care Choice:  (TBD) Living arrangements for the past 2 months: Single Family Home                                       Social Determinants of Health (SDOH) Interventions SDOH Screenings   Food Insecurity: No Food Insecurity (02/21/2023)  Housing: Patient Declined (02/21/2023)  Transportation Needs: No Transportation Needs (02/21/2023)  Utilities: Not At Risk (02/21/2023)  Alcohol Screen: Low Risk  (06/07/2022)  Physical Activity: Inactive (06/07/2022)  Tobacco Use: High Risk (02/21/2023)    Readmission Risk Interventions    12/29/2022    3:15 PM 11/02/2022    2:16 PM  Readmission Risk Prevention Plan  Transportation Screening Complete Complete  PCP or Specialist Appt within 5-7 Days  Complete  PCP or Specialist Appt within 3-5 Days Complete   Home Care Screening  Complete  Medication Review (RN CM)  Complete  HRI or Home Care Consult Complete   Social Work Consult for Recovery Care Planning/Counseling Complete   Palliative Care Screening Complete   Medication Review Oceanographer) Complete

## 2023-02-23 NOTE — Progress Notes (Signed)
PROGRESS NOTE    [place]  VFI:[id number] DOB: [birth year] DOA: 02/21/2023 PCP: Raymon Mutton., FNP    Brief Narrative:   [name] is a 82 y.o. female with past medical history significant for COPD on 2 L [place], type 2 diabetes mellitus, HTN, hyperlipidemia, lung cancer not currently on treatment, CVA, cirrhosis, gastroparesis, chronic debility/nonambulatory, chronic foot ulcers who presented to [facility] ED on 02/21/2023 with concern regarding worsening wounds to bilateral feet.  Daughter reports over the last week, wounds progressive with increased drainage and odor.  Also reports with intermittent fever.  Home care services referred patient to ED for further evaluation and workup.  In the ED, temperature 98.7 F, HR 65, RR 15, BP 119/57, SpO2 100% on room air.  WBC 14.8, hemoglobin 7.3, platelet count 421.  Sodium 132, potassium 4.4, chloride 97, CO2 25, glucose 121, BUN 11, creatinine 0.73.  AST 17, ALT 8, total open 0.8.  COVID/influenza/RSV PCR negative.  Chest x-ray with right upper lobe mass as noted on recent CT, cardiomegaly with vascular congestion, suspected retrocardiac opacity due to atelectasis versus airspace disease versus pleural effusion.  X-ray left foot with no radiographic evidence of osteomyelitis, soft tissue irregularity posterior to the calcaneus may represent site of wound with dorsal soft tissue edema.  Right foot x-ray with skin irregularity posterior to the calcaneus with patchy soft tissue gas extending proximally may represent gangrene or tracking ulcer, irregularity of the posterior calcaneus including region of the Achilles tendon insertion suspicious for osteomyelitis.  Patient was started on empiric antibiotics.  TRH consulted for admission for further evaluation and management.  Assessment & Plan:   Right foot gangrene with osteomyelitis Peripheral vascular disease with gangrenous changes bilateral lower extremities Presenting with worsening  wounds to bilateral lower extremities.  Right foot x-ray with skin irregularity posterior to the calcaneus with patchy soft tissue gas extending proximally may represent gangrene or tracking ulcer, irregularity of the posterior calcaneus including region of the Achilles tendon insertion suspicious for osteomyelitis.  Patient was seen by orthopedic surgery, Dr. Lajoyce Corners with recommendation of bilateral AKA; which patient and family have declined. -- Palliative care following, appreciate assistance -- Linezolid 600mg  PO q12h -- Zosyn 3.375 g IV q8h -- Aspirin 81 mg p.o. daily, Plavix and 5 mg p.o. daily -- Anticipated return to home hospice services on discharge  Wound care: Betadine moist gauze to the left heel, change daily, top with ABD pad, secure with kerlix Paint right heel with betadine swab daily, allow to air dry. Protect with silicone foam. Offload heels bilaterally with Prevalon boots.  Iron deficiency anemia Hemoglobin 7.3 on admission, stable.  Chronic hypoxic respiratory failure on home oxygen Obstructive sleep apnea --Incruse Ellipta 1 puff daily -- Continue supplemental oxygen, maintain SpO2 greater than 88% -- Historically intolerant of CPAP  Chronic diastolic congestive heart failure, compensated TTE 10/29/2022 with LVEF 55 to 60%, no regional LV wall motion normalities, mild LVH, LV diastolic function could not be evaluated, no aortic stenosis.  -- Furosemide 40 mg p.o. daily  -- metoprolol tartrate 12.5 mg p.o. twice daily -- ASA, plavix   History of epilepsy -- Keppra 500 mg p.o. twice daily  Essential hypertension -- Amlodipine -- Metoprolol tartrate 12.5 mg p.o. twice daily  Hyperlipidemia -- Atorvastatin 40 mg p.o. daily  GERD -- Protonix 40 mg p.o. daily  Morbid obesity Body mass index is 44.36 kg/m.  Complicates all facets of care   DVT prophylaxis: enoxaparin (LOVENOX) injection 40 mg  Start: 02/21/23 1800    Code Status: Limited: Do not attempt  resuscitation (DNR) -DNR-LIMITED -Do Not Intubate/DNI  Family Communication:   Disposition Plan:  Level of care: Med-Surg Status is: Inpatient Remains inpatient appropriate because: Anticipate discharge back home with resumption of home hospice services tomorrow    Consultants:  Podiatry, Dr. Annamary Rummage [facility], Dr. Lajoyce Corners  Procedures:  None  Antimicrobials:  Vancomycin 11/4 - 11/4 Linezolid 11/4>> Zosyn 11/4>>   Subjective: Patient seen examined bedside, resting calmly.  Lying in bed.  No complaints.  Social work consulted [facility] [place] for change in hospice services on discharge.  Patient has declined surgical invention regarding recommendation for AKA for ultimate treatment of her lower extremity wound/osteomyelitis.  No other specific questions or concerns at this time.  Denies headache, no chest pain, no shortness of breath, no abdominal pain.  No acute events overnight per nurse staff.  Objective: Vitals:   02/22/23 2159 02/23/23 0600 02/23/23 0843 02/23/23 0845  BP: 136/63 (!) 133/50  (!) 133/50  Pulse: 88 80  80  Resp:  18    Temp:  98 F (36.7 C)    TempSrc:  Oral    SpO2: 99% 99% 99%   Weight:      Height:        Intake/Output Summary (Last 24 hours) at 02/23/2023 1616 Last data filed at 02/23/2023 1200 Gross per 24 hour  Intake 1736.85 ml  Output 1850 ml  Net -113.15 ml   Filed Weights   02/21/23 2258  Weight: 124.6 kg    Examination:  Physical Exam: GEN: NAD, alert, chronically ill appearance, appears older than stated age HEENT: NCAT, PERRL, EOMI, sclera clear, MMM PULM: CTAB w/o wheezes/crackles, normal respiratory effort, on 2 L nasal cannula which is her baseline CV: RRR w/o M/G/R GI: abd soft, NTND, NABS, MSK: Moves all EXTR independently, noted wounds bilateral lower extremities/heels as depicted below NEURO: CN II-XII intact, no focal deficits, sensation to light touch intact PSYCH: normal mood/affect Integumentary: Heel  wounds as depicted below, otherwise no other concerning rashes/lesions/wounds noted on exposed skin surfaces       Data Reviewed: I have personally reviewed following labs and imaging studies  CBC: Recent Labs  Lab 02/21/23 1441  WBC 14.8*  NEUTROABS 11.6*  HGB 7.3*  HCT 23.8*  MCV 84.1  PLT 421*   Basic Metabolic Panel: Recent Labs  Lab 02/21/23 1441  NA 132*  K 4.4  CL 97*  CO2 25  GLUCOSE 121*  BUN 11  CREATININE 0.73  CALCIUM 7.9*   GFR: Estimated Creatinine Clearance: 74.4 mL/min (by C-G formula based on SCr of 0.73 mg/dL). Liver Function Tests: Recent Labs  Lab 02/21/23 1441  AST 17  ALT 8  ALKPHOS 94  BILITOT 0.8  PROT 5.3*  ALBUMIN 1.8*   No results for input(s): "LIPASE", "AMYLASE" in the last 168 hours. No results for input(s): "AMMONIA" in the last 168 hours. Coagulation Profile: Recent Labs  Lab 02/21/23 1535  INR 1.2   Cardiac Enzymes: No results for input(s): "CKTOTAL", "CKMB", "CKMBINDEX", "TROPONINI" in the last 168 hours. BNP (last 3 results) No results for input(s): "PROBNP" in the last 8760 hours. HbA1C: No results for input(s): "HGBA1C" in the last 72 hours. CBG: Recent Labs  Lab 02/22/23 1154 02/22/23 1645 02/22/23 2204 02/23/23 0834 02/23/23 1132  GLUCAP 184* 184* 178* 150* 199*   Lipid Profile: No results for input(s): "CHOL", "HDL", "LDLCALC", "TRIG", "CHOLHDL", "LDLDIRECT" in the last 72 hours.  Thyroid Function Tests: No results for input(s): "TSH", "T4TOTAL", "FREET4", "T3FREE", "THYROIDAB" in the last 72 hours. Anemia Panel: No results for input(s): "VITAMINB12", "FOLATE", "FERRITIN", "TIBC", "IRON", "RETICCTPCT" in the last 72 hours. Sepsis Labs: Recent Labs  Lab 02/21/23 1523 02/21/23 1609  LATICACIDVEN 1.3 0.5    Recent Results (from the past 240 hour(s))  Blood Culture (routine x 2)     Status: None (Preliminary result)   Collection Time: 02/21/23  2:41 PM   Specimen: BLOOD  Result Value Ref Range  Status   Specimen Description BLOOD RIGHT ANTECUBITAL  Final   Special Requests   Final    BOTTLES DRAWN AEROBIC AND ANAEROBIC Blood Culture results may not be optimal due to an excessive volume of blood received in culture bottles   Culture   Final    NO GROWTH 2 DAYS Performed at [facility], [address] [place], [place] [address]    Report Status PENDING  Incomplete  Resp panel by RT-PCR (RSV, Flu A&B, Covid) Anterior Nasal Swab     Status: None   Collection Time: 02/21/23  2:47 PM   Specimen: Anterior Nasal Swab  Result Value Ref Range Status   SARS Coronavirus 2 by RT PCR NEGATIVE NEGATIVE Final   Influenza A by PCR NEGATIVE NEGATIVE Final   Influenza B by PCR NEGATIVE NEGATIVE Final    Comment: (NOTE) The Xpert Xpress SARS-CoV-2/FLU/RSV plus assay is intended as an aid in the diagnosis of influenza from Nasopharyngeal swab specimens and should not be used as a sole basis for treatment. Nasal washings and aspirates are unacceptable for Xpert Xpress SARS-CoV-2/FLU/RSV testing.  Fact Sheet for Patients: BloggerCourse.com  Fact Sheet for Healthcare Providers: SeriousBroker.it  This test is not yet approved or cleared by the [place] FDA and has been authorized for detection and/or diagnosis of SARS-CoV-2 by FDA under an Emergency Use Authorization (EUA). This EUA will remain in effect (meaning this test can be used) for the duration of the COVID-19 declaration under Section 564(b)(1) of the Act, 21 U.S.C. section 360bbb-3(b)(1), unless the authorization is terminated or revoked.     Resp Syncytial Virus by PCR NEGATIVE NEGATIVE Final    Comment: (NOTE) Fact Sheet for Patients: BloggerCourse.com  Fact Sheet for Healthcare Providers: SeriousBroker.it  This test is not yet approved or cleared by the [place] FDA and has been authorized for detection  and/or diagnosis of SARS-CoV-2 by FDA under an Emergency Use Authorization (EUA). This EUA will remain in effect (meaning this test can be used) for the duration of the COVID-19 declaration under Section 564(b)(1) of the Act, 21 U.S.C. section 360bbb-3(b)(1), unless the authorization is terminated or revoked.  Performed at [facility], [address] [place], [place] [address]   Blood Culture (routine x 2)     Status: None (Preliminary result)   Collection Time: 02/21/23  3:56 PM   Specimen: BLOOD LEFT ARM  Result Value Ref Range Status   Specimen Description BLOOD LEFT ARM  Final   Special Requests   Final    BOTTLES DRAWN AEROBIC ONLY Blood Culture results may not be optimal due to an inadequate volume of blood received in culture bottles   Culture  Setup Time   Final    GRAM POSITIVE COCCI IN CLUSTERS AEROBIC BOTTLE ONLY CRITICAL RESULT CALLED TO, READ BACK BY AND VERIFIED WITH: PHARMD LYDIA CHEN ON 02/22/23 @ 2223 BY DRT    Culture   Final    Romie Minus  POSITIVE COCCI CULTURE REINCUBATED FOR BETTER GROWTH Performed at [facility], [address] [place], [place] [address]    Report Status PENDING  Incomplete  Blood Culture ID Panel (Reflexed)     Status: Abnormal   Collection Time: 02/21/23  3:56 PM  Result Value Ref Range Status   Enterococcus faecalis NOT DETECTED NOT DETECTED Final   Enterococcus Faecium NOT DETECTED NOT DETECTED Final   Listeria monocytogenes NOT DETECTED NOT DETECTED Final   Staphylococcus species DETECTED (A) NOT DETECTED Final    Comment: CRITICAL RESULT CALLED TO, READ BACK BY AND VERIFIED WITH: PHARMD LYDIA CHEN ON 02/22/23 @ 2223 BY DRT    Staphylococcus aureus (BCID) NOT DETECTED NOT DETECTED Final   Staphylococcus epidermidis DETECTED (A) NOT DETECTED Final    Comment: Methicillin (oxacillin) resistant coagulase negative staphylococcus. Possible blood culture contaminant (unless isolated from more than one blood culture draw or clinical  case suggests pathogenicity). No antibiotic treatment is indicated for blood  culture contaminants. CRITICAL RESULT CALLED TO, READ BACK BY AND VERIFIED WITH: PHARMD LYDIA CHEN ON 02/22/23 @ 2223 BY DRT    Staphylococcus lugdunensis NOT DETECTED NOT DETECTED Final   Streptococcus species NOT DETECTED NOT DETECTED Final   Streptococcus agalactiae NOT DETECTED NOT DETECTED Final   Streptococcus pneumoniae NOT DETECTED NOT DETECTED Final   Streptococcus pyogenes NOT DETECTED NOT DETECTED Final   A.calcoaceticus-baumannii NOT DETECTED NOT DETECTED Final   Bacteroides fragilis NOT DETECTED NOT DETECTED Final   Enterobacterales NOT DETECTED NOT DETECTED Final   Enterobacter cloacae complex NOT DETECTED NOT DETECTED Final   Escherichia coli NOT DETECTED NOT DETECTED Final   Klebsiella aerogenes NOT DETECTED NOT DETECTED Final   Klebsiella oxytoca NOT DETECTED NOT DETECTED Final   Klebsiella pneumoniae NOT DETECTED NOT DETECTED Final   Proteus species NOT DETECTED NOT DETECTED Final   Salmonella species NOT DETECTED NOT DETECTED Final   Serratia marcescens NOT DETECTED NOT DETECTED Final   Haemophilus influenzae NOT DETECTED NOT DETECTED Final   Neisseria meningitidis NOT DETECTED NOT DETECTED Final   Pseudomonas aeruginosa NOT DETECTED NOT DETECTED Final   Stenotrophomonas maltophilia NOT DETECTED NOT DETECTED Final   Candida albicans NOT DETECTED NOT DETECTED Final   Candida auris NOT DETECTED NOT DETECTED Final   Candida glabrata NOT DETECTED NOT DETECTED Final   Candida krusei NOT DETECTED NOT DETECTED Final   Candida parapsilosis NOT DETECTED NOT DETECTED Final   Candida tropicalis NOT DETECTED NOT DETECTED Final   Cryptococcus neoformans/gattii NOT DETECTED NOT DETECTED Final   Methicillin resistance mecA/C DETECTED (A) NOT DETECTED Final    Comment: CRITICAL RESULT CALLED TO, READ BACK BY AND VERIFIED WITH: PHARMD LYDIA CHEN ON 02/22/23 @ 2223 BY DRT Performed at [facility]  Lab, [address], [place], [place] [address]          Radiology Studies: No results found.      Scheduled Meds:  amLODipine  5 mg Oral Daily   aspirin EC  81 mg Oral Daily   atorvastatin  40 mg Oral Daily   clopidogrel  75 mg Oral Daily   enoxaparin (LOVENOX) injection  40 mg Subcutaneous Q24H   fentaNYL  1 patch Transdermal Q72H   furosemide  40 mg Oral Daily   gabapentin  300 mg Oral BID   influenza vaccine adjuvanted  0.5 mL Intramuscular Tomorrow-1000   insulin aspart  0-5 Units Subcutaneous QHS   insulin aspart  0-9 Units Subcutaneous TID WC   levETIRAcetam  500 mg Oral BID   linezolid  600 mg Oral Q12H   metoprolol tartrate  12.5 mg Oral BID   pantoprazole  40 mg Oral BID   tamsulosin  0.4 mg Oral Daily   umeclidinium bromide  1 puff Inhalation Daily   Continuous Infusions:  piperacillin-tazobactam (ZOSYN)  IV 3.375 g (02/23/23 0834)     LOS: 2 days    Time spent: 53 minutes spent on chart review, discussion with nursing staff, consultants, updating family and interview/physical exam; more than 50% of that time was spent in counseling and/or coordination of care.    Alvira Philips [place], DO [facility] Available via Epic secure chat 7am-7pm After these hours, please refer to coverage provider listed on [email or web address] 02/23/2023, 4:16 PM

## 2023-02-23 NOTE — Progress Notes (Signed)
PT Cancellation Note and Discharge  Patient Details Name: [name] MRN: [id number] DOB: [birth year]   Cancelled Treatment:    Reason Eval/Treat Not Completed: Discussed pt case with RN who reports pt is from home with hospice. Noted ortho's recommendations for B AKA. Per hospitalist's note on 02/22/2023 pt/family declining and pt will return home with hospice support. Pt does not require acute PT evaluation at this time. If needs change, please reconsult.    Marylynn Pearson 02/23/2023, 1:37 PM  Conni Slipper, PT, DPT Acute Rehabilitation Services Secure Chat Preferred Office: [phone]

## 2023-02-23 NOTE — Consult Note (Signed)
WOC Nurse Consult Note: Reason for Consult:bilateral gangrenous heel ulcers Seen by orthopedics and podiatry. Non ambulatory patient, WC bound IMPRESSION: R foot xray Skin irregularity posterior to the calcaneus with patchy soft tissue gas extending proximally. This may represent gangrene or tracking ulcer. Irregularity of the posterior calcaneus including the region of the Achilles tendon insertion suspicious for osteomyelitis. Left foot xray negative  Wound type:pressure injuries; both Unstageable; right stable eschar, left softer eschar 80%/20% pink, yellow along eschar edges, wound perimeter  Pressure Injury POA: Yes Measurement: see nursing flow sheets Wound bed:see above Drainage (amount, consistency, odor) scant, serosanguinous  Periwound: intact  Dressing procedure/placement/frequency: Betadine moist gauze to the left heel, change daily, top with ABD pad, secure with kerlix Paint right heel with betadine swab daily, allow to air dry. Protect with silicone foam. Offload heels bilaterally with Prevalon boots.     Re consult if needed, will not follow at this time. Thanks  Dominque Marlin [employer], RN,CWOCN, CNS, CWON-AP [phone])

## 2023-02-24 DIAGNOSIS — M869 Osteomyelitis, unspecified: Secondary | ICD-10-CM | POA: Diagnosis not present

## 2023-02-24 DIAGNOSIS — Z515 Encounter for palliative care: Secondary | ICD-10-CM

## 2023-02-24 DIAGNOSIS — E1169 Type 2 diabetes mellitus with other specified complication: Secondary | ICD-10-CM | POA: Diagnosis not present

## 2023-02-24 DIAGNOSIS — Z7189 Other specified counseling: Secondary | ICD-10-CM

## 2023-02-24 LAB — CULTURE, BLOOD (ROUTINE X 2)

## 2023-02-24 LAB — GLUCOSE, CAPILLARY
Glucose-Capillary: 173 mg/dL — ABNORMAL HIGH (ref 70–99)
Glucose-Capillary: 211 mg/dL — ABNORMAL HIGH (ref 70–99)

## 2023-02-24 MED ORDER — AMLODIPINE BESYLATE 5 MG PO TABS: 5.0000 mg | ORAL_TABLET | Freq: Every day | ORAL | 0 refills | Status: AC

## 2023-02-24 MED ORDER — OXYCODONE HCL 10 MG PO TABS: 10.0000 mg | ORAL_TABLET | ORAL | 0 refills | Status: AC | PRN

## 2023-02-24 MED ORDER — FENTANYL 25 MCG/HR TD PT72
1.0000 | MEDICATED_PATCH | TRANSDERMAL | Status: DC
  Administered 2023-02-24: 1 via TRANSDERMAL
  Filled 2023-02-24: qty 1

## 2023-02-24 MED ORDER — AMOXICILLIN-POT CLAVULANATE 875-125 MG PO TABS: 1.0000 | ORAL_TABLET | Freq: Two times a day (BID) | ORAL | 0 refills | Status: AC

## 2023-02-24 MED ORDER — LEVETIRACETAM 500 MG PO TABS: 500.0000 mg | ORAL_TABLET | Freq: Two times a day (BID) | ORAL | 2 refills | Status: AC

## 2023-02-24 MED ORDER — ATORVASTATIN CALCIUM 40 MG PO TABS: 40.0000 mg | ORAL_TABLET | Freq: Every day | ORAL | 0 refills | Status: AC

## 2023-02-24 MED ORDER — ASPIRIN EC 81 MG PO TBEC: 81.0000 mg | DELAYED_RELEASE_TABLET | Freq: Every day | ORAL | 0 refills | Status: AC

## 2023-02-24 MED ORDER — FENTANYL 12 MCG/HR TD PT72: 1.0000 | MEDICATED_PATCH | TRANSDERMAL | 0 refills | Status: AC

## 2023-02-24 MED ORDER — SPIRIVA HANDIHALER 18 MCG IN CAPS: 1.0000 | ORAL_CAPSULE | Freq: Every day | RESPIRATORY_TRACT | 2 refills | Status: AC

## 2023-02-24 MED ORDER — METOPROLOL TARTRATE 25 MG PO TABS: 12.5000 mg | ORAL_TABLET | Freq: Two times a day (BID) | ORAL | 2 refills | Status: AC

## 2023-02-24 MED ORDER — FUROSEMIDE 20 MG PO TABS: 40.0000 mg | ORAL_TABLET | Freq: Every day | ORAL | 0 refills | Status: AC

## 2023-02-24 MED ORDER — PANTOPRAZOLE SODIUM 40 MG PO TBEC: 40.0000 mg | DELAYED_RELEASE_TABLET | Freq: Two times a day (BID) | ORAL | 2 refills | Status: AC

## 2023-02-24 MED ORDER — DOXYCYCLINE HYCLATE 100 MG PO TABS: 100.0000 mg | ORAL_TABLET | Freq: Two times a day (BID) | ORAL | 0 refills | Status: AC

## 2023-02-24 MED ORDER — LORAZEPAM 0.5 MG PO TABS: 0.5000 mg | ORAL_TABLET | Freq: Two times a day (BID) | ORAL | 0 refills | Status: AC | PRN

## 2023-02-24 MED ORDER — SENNA 8.6 MG PO TABS: 2.0000 | ORAL_TABLET | Freq: Two times a day (BID) | ORAL | 0 refills | Status: AC

## 2023-02-24 MED ORDER — CLOPIDOGREL BISULFATE 75 MG PO TABS: 75.0000 mg | ORAL_TABLET | Freq: Every day | ORAL | 0 refills | Status: AC

## 2023-02-24 MED ORDER — GABAPENTIN 300 MG PO CAPS: 300.0000 mg | ORAL_CAPSULE | Freq: Two times a day (BID) | ORAL | 2 refills | Status: AC

## 2023-02-24 NOTE — Care Management Important Message (Signed)
Important Message  Patient Details  Name: [name] MRN: [id number] Date of Birth: 1941-01-06   Important Message Given:  Yes - Medicare IM     Dorena Bodo 02/24/2023, 3:00 PM

## 2023-02-24 NOTE — TOC Progression Note (Addendum)
Transition of Care (TOC) - Progression Note   Called patient's daughter Pricilla [phone] .   Plan is for her mother to go home today with [facility] after DME is changed out. She has not gotten any calls from DME agency . Patient will need ambulance transport home. NCM confirmed address. Pricilla has NCM direct phone number and will call when DME is delivered , so NCM can call PTAR.   Cheri with [facility] will check on DME , it was ordered yesterday .    Daughter called she has DME including bed and oxygen and is ready for ambulance to be called.  PTAR called  Patient Details  Name: [name] MRN: [id number] Date of Birth: 1941/02/19  Transition of Care [facility]) CM/SW Contact  Brighton Delio, Adria Devon, RN Phone Number: 02/24/2023, 11:37 AM  Clinical Narrative:       Expected Discharge Plan:  (TBD) Barriers to Discharge: Continued Medical Work up, Other (must enter comment) (TOC following for medical plan and disposition.)  Expected Discharge Plan and Services In-house Referral: Hospice / Palliative Care, Clinical Social Work   Post Acute Care Choice:  (TBD) Living arrangements for the past 2 months: Single Family Home Expected Discharge Date: 02/24/23                                     Social Determinants of Health (SDOH) Interventions SDOH Screenings   Food Insecurity: No Food Insecurity (02/21/2023)  Housing: Patient Declined (02/21/2023)  Transportation Needs: No Transportation Needs (02/21/2023)  Utilities: Not At Risk (02/21/2023)  Alcohol Screen: Low Risk  (06/07/2022)  Physical Activity: Inactive (06/07/2022)  Tobacco Use: High Risk (02/21/2023)    Readmission Risk Interventions    12/29/2022    3:15 PM 11/02/2022    2:16 PM  Readmission Risk Prevention Plan  Transportation Screening Complete Complete  PCP or Specialist Appt within 5-7 Days  Complete  PCP or Specialist Appt within 3-5 Days Complete   Home Care Screening  Complete   Medication Review (RN CM)  Complete  HRI or Home Care Consult Complete   Social Work Consult for Recovery Care Planning/Counseling Complete   Palliative Care Screening Complete   Medication Review Oceanographer) Complete

## 2023-02-24 NOTE — Progress Notes (Addendum)
   Palliative Medicine Inpatient Follow Up Note   HPI: [name] is a 82 y.o. female with medical history significant of lung cancer currently on hospice care, DM, CAD, presented to ED with concerns of non-healing foot wounds. Pt is not great hisotrian. From what can be gathered, pt reports chronic hx of foot ulcers, being treated as outpt.    The Palliative care team has been asked to get involved to further assist with goals of care conversation.   [name] is established with [facility] hospice as an outpatient.   Today's Discussion 02/24/2023  *Please note that this is a verbal dictation therefore any spelling or grammatical errors are due to the "Dragon Medical One" system interpretation.  Chart reviewed inclusive of vital signs, progress notes, laboratory results, and diagnostic images.   I met with [name] this morning. She shares frustration that she is in pain and no one has checked in on her this morning. We discussed her present state and overall poor health. She does appear at peace with the idea of death and dying. We reviewed the plan to provide medication for BLE pain.   Offered time for patient to express herself. Support provided through therapeutic listening.   We discussed the plan for [name] to transition home once DME are delivered.   Plan for symptoms today include increasing fentanyl patch dose to 3mcg/hr.  Questions and concerns addressed/Palliative Support Provided.   Objective Assessment: Vital Signs Vitals:   02/24/23 0919 02/24/23 0943  BP: 114/61 (!) 130/58  Pulse: 66 68  Resp:  (!) 22  Temp:  98.5 F (36.9 C)  SpO2:  100%    Intake/Output Summary (Last 24 hours) at 02/24/2023 1033 Last data filed at 02/24/2023 0946 Gross per 24 hour  Intake 810 ml  Output 1500 ml  Net -690 ml   Last Weight  Most recent update: 02/21/2023 10:59 PM    Weight  124.6 kg (274 lb 11.1 oz)            Gen:  Elderly AA F in mild distress HEENT:  moist mucous membranes CV: Regular rate and rhythm  PULM:  On 2.5LPM [place] breathing is not labored ABD: soft/nontender  EXT: BLE edema  Neuro: Alert and oriented x3   SUMMARY OF RECOMMENDATIONS   DNAR/DNI   Patient does not want an amputation   Optimize pain management: - Increase fentanyl patch to 29mcg/hr - Change Oxycodone 5mg /10mg  PO Q4H PRN Moderate - Severe pain - Dilaudid 0.5-1mg  Q3H PRN severe breakthrough pain - Gabapentin 300mg  BID   Transition home tomorrow with [facility] based on MDM: Moderate ______________________________________________________________________________________ Lamarr Lulas [place] Palliative Medicine Team Team Cell Phone: [phone] Please utilize secure chat with additional questions, if there is no response within 30 minutes please call the above phone number  Palliative Medicine Team providers are available by phone from 7am to 7pm daily and can be reached through the team cell phone.  Should this patient require assistance outside of these hours, please call the patient's attending physician.

## 2023-02-24 NOTE — Plan of Care (Signed)

## 2023-02-24 NOTE — Progress Notes (Signed)
  This pt has requested to change from [facility] hospice services at home to [facility] services at home. I have reviewed chart, Met with family daughter Belinda Block and GD at bedside to discuss this change. The daughter reports that [facility] d/c them from there services yesterday (02/23/23). We have ordered equipment of bed, BSC and oxygen to be delivered since pt was d/c [facility] will have there equipment picked up.  We will be able to accommodate pt for admission today once equipment is in place.    Norm Parcel RN [phone]

## 2023-02-24 NOTE — Plan of Care (Signed)
  Problem: Education: Goal: Ability to describe self-care measures that may prevent or decrease complications (Diabetes Survival Skills Education) will improve Outcome: Not Progressing   Problem: Coping: Goal: Ability to adjust to condition or change in health will improve Outcome: Progressing   Problem: Fluid Volume: Goal: Ability to maintain a balanced intake and output will improve Outcome: Progressing   Problem: Health Behavior/Discharge Planning: Goal: Ability to identify and utilize available resources and services will improve Outcome: Progressing Goal: Ability to manage health-related needs will improve Outcome: Progressing   Problem: Metabolic: Goal: Ability to maintain appropriate glucose levels will improve Outcome: Progressing   Problem: Nutritional: Goal: Maintenance of adequate nutrition will improve Outcome: Progressing Goal: Progress toward achieving an optimal weight will improve Outcome: Progressing   Problem: Skin Integrity: Goal: Risk for impaired skin integrity will decrease Outcome: Progressing   Problem: Tissue Perfusion: Goal: Adequacy of tissue perfusion will improve Outcome: Progressing   Problem: Education: Goal: Knowledge of General Education information will improve Description: Including pain rating scale, medication(s)/side effects and non-pharmacologic comfort measures Outcome: Progressing   Problem: Health Behavior/Discharge Planning: Goal: Ability to manage health-related needs will improve Outcome: Not Progressing   Problem: Clinical Measurements: Goal: Ability to maintain clinical measurements within normal limits will improve Outcome: Progressing Goal: Will remain free from infection Outcome: Progressing Goal: Diagnostic test results will improve Outcome: Progressing Goal: Respiratory complications will improve Outcome: Progressing Goal: Cardiovascular complication will be avoided Outcome: Progressing   Problem:  Activity: Goal: Risk for activity intolerance will decrease Outcome: Not Progressing   Problem: Nutrition: Goal: Adequate nutrition will be maintained Outcome: Progressing   Problem: Coping: Goal: Level of anxiety will decrease Outcome: Progressing   Problem: Elimination: Goal: Will not experience complications related to bowel motility Outcome: Progressing Goal: Will not experience complications related to urinary retention Outcome: Progressing   Problem: Pain Management: Goal: General experience of comfort will improve Outcome: Progressing   Problem: Safety: Goal: Ability to remain free from injury will improve Outcome: Progressing   Problem: Skin Integrity: Goal: Risk for impaired skin integrity will decrease Outcome: Progressing

## 2023-02-24 NOTE — Discharge Summary (Signed)
Physician Discharge Summary  [name] IEP:[id number] DOB: [birth year] DOA: 02/21/2023  PCP: [name]., FNP  Admit date: 02/21/2023 Discharge date: 02/24/2023  Admitted From: Home Disposition: Home with hospice ([facility])  Recommendations for Outpatient Follow-up:  Follow up with PCP in 1-2 weeks Follow-up with orthopedics, [name] as needed if has change of mind for recommendation of bilateral above-knee amputations for definitive treatment of gangrene/osteomyelitis Continue antibiotics with doxycycline and Augmentin to complete 6-week course for osteomyelitis  Discharge Condition: Stable but overall prognosis poor/guarded CODE STATUS: DNR Diet recommendation: Heart healthy consistent carb regular diet  History of present illness:  [name] is a 82 y.o. female with past medical history significant for COPD on 2 L [place], type 2 diabetes mellitus, HTN, hyperlipidemia, lung cancer not currently on treatment, CVA, cirrhosis, gastroparesis, chronic debility/nonambulatory, chronic foot ulcers who presented to [facility] ED on 02/21/2023 with concern regarding worsening wounds to bilateral feet.  Daughter reports over the last week, wounds progressive with increased drainage and odor.  Also reports with intermittent fever.  Home care services referred patient to ED for further evaluation and workup.   In the ED, temperature 98.7 F, HR 65, RR 15, BP 119/57, SpO2 100% on room air.  WBC 14.8, hemoglobin 7.3, platelet count 421.  Sodium 132, potassium 4.4, chloride 97, CO2 25, glucose 121, BUN 11, creatinine 0.73.  AST 17, ALT 8, total open 0.8.  COVID/influenza/RSV PCR negative.  Chest x-ray with right upper lobe mass as noted on recent CT, cardiomegaly with vascular congestion, suspected retrocardiac opacity due to atelectasis versus airspace disease versus pleural effusion.  X-ray left foot with no radiographic evidence of osteomyelitis, soft tissue  irregularity posterior to the calcaneus may represent site of wound with dorsal soft tissue edema.  Right foot x-ray with skin irregularity posterior to the calcaneus with patchy soft tissue gas extending proximally may represent gangrene or tracking ulcer, irregularity of the posterior calcaneus including region of the Achilles tendon insertion suspicious for osteomyelitis.  Patient was started on empiric antibiotics.  TRH consulted for admission for further evaluation and management.  Hospital course:  Right foot gangrene with osteomyelitis Peripheral vascular disease with gangrenous changes bilateral lower extremities Presenting with worsening wounds to bilateral lower extremities.  Right foot x-ray with skin irregularity posterior to the calcaneus with patchy soft tissue gas extending proximally may represent gangrene or tracking ulcer, irregularity of the posterior calcaneus including region of the Achilles tendon insertion suspicious for osteomyelitis.  Patient was seen by orthopedic surgery, [name] with recommendation of bilateral AKA; which patient and family have declined.  Seen by palliative care during hospitalization and will transition to a different hospice company, [facility] on discharge.  Was treated with linezolid and Zosyn while inpatient and will transition to doxycycline and Augmentin on discharge.  Continue aspirin and Plavix.  Outpatient follow-up with orthopedics, Dr. Due to if has change of mind regarding definitive treatment for infection/gangrene with recommendation of bilateral above-knee amputation.  Discharging back on home hospice services.   Wound care: Betadine moist gauze to the left heel, change daily, top with ABD pad, secure with kerlix Paint right heel with betadine swab daily, allow to air dry. Protect with silicone foam. Offload heels bilaterally with Prevalon boots.   Iron deficiency anemia Hemoglobin 7.3 on admission, stable.   Chronic hypoxic  respiratory failure on home oxygen Obstructive sleep apnea Continue Spiriva daily. Continue supplemental oxygen, maintain SpO2 greater than 88%.  Historically intolerant of CPAP   Chronic diastolic congestive heart failure, compensated TTE 10/29/2022 with LVEF 55 to 60%, no regional LV wall motion normalities, mild LVH, LV diastolic function could not be evaluated, no aortic stenosis.  Furosemide 40 mg p.o. daily, metoprolol tartrate 12.5 mg p.o. twice daily, ASA, plavix    History of epilepsy Keppra 500 mg p.o. twice daily   Essential hypertension Amlodipine 5 mg p.o. daily, Metoprolol tartrate 12.5 mg p.o. twice daily   Hyperlipidemia Atorvastatin 40 mg p.o. daily   GERD Protonix 40 mg p.o. daily   Morbid obesity Body mass index is 44.36 kg/m.  Complicates all facets of care  Discharge Diagnoses:  Principal Problem:   Diabetic osteomyelitis (HCC) Active Problems:   Gas gangrene of foot (HCC)   Wound infection    Discharge Instructions  Discharge Instructions     Call MD for:  difficulty breathing, headache or visual disturbances   Complete by: As directed    Call MD for:  extreme fatigue   Complete by: As directed    Call MD for:  persistant dizziness or light-headedness   Complete by: As directed    Call MD for:  persistant nausea and vomiting   Complete by: As directed    Call MD for:  severe uncontrolled pain   Complete by: As directed    Call MD for:  temperature >100.4   Complete by: As directed    Diet - low sodium heart healthy   Complete by: As directed    Discharge wound care:   Complete by: As directed    Paint right heel with betadine swab daily, allow to air dry.  Offload with Prevalon boot at all times. Moisten 4x4 gauze with betadine (bottled betadine)apply to wound bed, top with dry ABD pad, secure with kerlix. Offload at all times with Prevalon boot   Increase activity slowly   Complete by: As directed       Allergies as of 02/24/2023        Reactions   Hydrocodone Other (See Comments)   Hallucination   Lisinopril Cough   Pioglitazone Swelling   edema   Varenicline Tartrate Other (See Comments)   Bad dreams        Medication List     STOP taking these medications    hydrALAZINE 25 MG tablet Commonly known as: APRESOLINE   Lantus SoloStar 100 UNIT/ML Solostar Pen Generic drug: insulin glargine   polyethylene glycol 17 g packet Commonly known as: MIRALAX / GLYCOLAX   traMADol 50 MG tablet Commonly known as: ULTRAM       TAKE these medications    albuterol 108 (90 Base) MCG/ACT inhaler Commonly known as: Proventil HFA Inhale 2 puffs into the lungs every 4 (four) hours as needed for wheezing or shortness of breath.   amLODipine 5 MG tablet Commonly known as: NORVASC Take 1 tablet (5 mg total) by mouth daily.   amoxicillin-clavulanate 875-125 MG tablet Commonly known as: AUGMENTIN Take 1 tablet by mouth 2 (two) times daily.   aspirin EC 81 MG tablet Take 1 tablet (81 mg total) by mouth daily.   atorvastatin 40 MG tablet Commonly known as: LIPITOR Take 1 tablet (40 mg total) by mouth daily.   clopidogrel 75 MG tablet Commonly known as: Plavix Take 1 tablet (75 mg total) by mouth daily.   doxycycline 100 MG tablet Commonly known as: VIBRA-TABS Take 1 tablet (100 mg total) by mouth 2 (two) times daily.   fentaNYL 12  MCG/HR Commonly known as: DURAGESIC Place 1 patch onto the skin every 3 (three) days.   furosemide 20 MG tablet Commonly known as: LASIX Take 2 tablets (40 mg total) by mouth daily.   gabapentin 300 MG capsule Commonly known as: NEURONTIN Take 1 capsule (300 mg total) by mouth 2 (two) times daily. What changed: See the new instructions.   insulin aspart 100 UNIT/ML injection Commonly known as: novoLOG Inject 0-15 Units into the skin 3 (three) times daily with meals.   ipratropium-albuterol 0.5-2.5 (3) MG/3ML Soln Commonly known as: DUONEB Take 3 mLs by nebulization every  6 (six) hours as needed.   levETIRAcetam 500 MG tablet Commonly known as: KEPPRA Take 1 tablet (500 mg total) by mouth 2 (two) times daily.   LORazepam 0.5 MG tablet Commonly known as: ATIVAN Take 1 tablet (0.5 mg total) by mouth 2 (two) times daily as needed for anxiety. What changed:  when to take this reasons to take this   metoprolol tartrate 25 MG tablet Commonly known as: LOPRESSOR Take 0.5 tablets (12.5 mg total) by mouth 2 (two) times daily.   multivitamin with minerals Tabs tablet Take 1 tablet by mouth daily.   ondansetron 4 MG tablet Commonly known as: ZOFRAN Take 4 mg by mouth every 6 (six) hours as needed for nausea or vomiting.   Oxycodone HCl 10 MG Tabs Take 1 tablet (10 mg total) by mouth every 4 (four) hours as needed for severe pain (pain score 7-10).   pantoprazole 40 MG tablet Commonly known as: PROTONIX Take 1 tablet (40 mg total) by mouth 2 (two) times daily.   senna 8.6 MG Tabs tablet Commonly known as: SENOKOT Take 2 tablets (17.2 mg total) by mouth 2 (two) times daily.   Spiriva HandiHaler 18 MCG inhalation capsule Generic drug: tiotropium Place 1 capsule (18 mcg total) into inhaler and inhale daily. What changed: how much to take   tamsulosin 0.4 MG Caps capsule Commonly known as: FLOMAX Take 1 capsule (0.4 mg total) by mouth daily.               Discharge Care Instructions  (From admission, onward)           Start     Ordered   02/24/23 0000  Discharge wound care:       Comments: Paint right heel with betadine swab daily, allow to air dry.  Offload with Prevalon boot at all times. Moisten 4x4 gauze with betadine (bottled betadine)apply to wound bed, top with dry ABD pad, secure with kerlix. Offload at all times with Prevalon boot   02/24/23 1037            Follow-up Information     [name]., FNP. Schedule an appointment as soon as possible for a visit in 1 week(s).   Specialty: Family Medicine Contact  information: [address] [place] [address] [phone]         Nadara Mustard, MD Follow up.   Specialty: Orthopedic Surgery Why: As needed if have change of mind regarding recommendation of bilateral above-knee amputations for definitive treatment of gangrene/osteomyelitis Contact information: [address] [place] [address] [phone]                Allergies  Allergen Reactions   Hydrocodone Other (See Comments)    Hallucination   Lisinopril Cough   Pioglitazone Swelling    edema   Varenicline Tartrate Other (See Comments)    Bad dreams  Consultations: Podiatry, Dr. Annam[facility], [name]   Procedures/Studies: DG Foot Complete Right  Result Date: 02/21/2023 CLINICAL DATA:  Wounds. EXAM: RIGHT FOOT COMPLETE - 3+ VIEW COMPARISON:  04/30/2020 FINDINGS: Skin irregularity posterior to the calcaneus with patchy soft tissue gas extending proximally. There is irregularity of the posterior calcaneus including the region of the Achilles tendon insertion suspicious for osteomyelitis. No other erosive change. Slight hammertoe deformity of the fifth toe. No fracture. No radiopaque foreign body. IMPRESSION: Skin irregularity posterior to the calcaneus with patchy soft tissue gas extending proximally. This may represent gangrene or tracking ulcer. Irregularity of the posterior calcaneus including the region of the Achilles tendon insertion suspicious for osteomyelitis. Electronically Signed   By: Narda Rutherford M.D.   On: 02/21/2023 16:07   DG Foot Complete Left  Result Date: 02/21/2023 CLINICAL DATA:  Wounds. EXAM: LEFT FOOT - COMPLETE 3+ VIEW COMPARISON:  Radiograph 04/20/2022 FINDINGS: No erosive or bony destructive change. Hammertoe deformity of the fifth toe. No evidence of fracture. Cortical thickening about the medial third metatarsal is chronic and unchanged. Soft tissue irregularity posterior to the calcaneus may represent site of  wound. There is some undulation of the soft tissues of the plantar foot. Dorsal soft tissue edema. No radiopaque foreign body or tracking soft tissue gas. IMPRESSION: No radiographic evidence of osteomyelitis. Soft tissue irregularity posterior to the calcaneus may represent site of wound. Dorsal soft tissue edema. Electronically Signed   By: Narda Rutherford M.D.   On: 02/21/2023 16:04   DG Chest Port 1 View  Result Date: 02/21/2023 CLINICAL DATA:  Foot wounds. EXAM: PORTABLE CHEST 1 VIEW COMPARISON:  Chest CT 02/11/2023 FINDINGS: Lung bases not included in the field of view. Cardiomegaly is similar. Stable mediastinal contours. Right upper lobe mass as seen on recent CT. Suspected retrocardiac opacity, incompletely included in the field of view. Vascular congestion. No pneumothorax. IMPRESSION: 1. Right upper lobe mass as seen on recent CT. 2. Suspected retrocardiac opacity, incompletely included in the field of view. This may be due to atelectasis, airspace disease or pleural effusion. 3. Cardiomegaly with vascular congestion. Electronically Signed   By: Narda Rutherford M.D.   On: 02/21/2023 16:03   CT CHEST ABDOMEN PELVIS W CONTRAST  Result Date: 02/11/2023 CLINICAL DATA:  History of lung cancer with generalized pain and several day history of bilateral leg swelling. Leukocytosis. * Tracking Code: BO * EXAM: CT CHEST, ABDOMEN, AND PELVIS WITH CONTRAST TECHNIQUE: Multidetector CT imaging of the chest, abdomen and pelvis was performed following the standard protocol during bolus administration of intravenous contrast. RADIATION DOSE REDUCTION: This exam was performed according to the departmental dose-optimization program which includes automated exposure control, adjustment of the mA and/or kV according to patient size and/or use of iterative reconstruction technique. CONTRAST:  75mL OMNIPAQUE IOHEXOL 350 MG/ML SOLN COMPARISON:  CT abdomen and pelvis dated 12/24/2022, CTA chest dated 12/04/2022  FINDINGS: CT CHEST FINDINGS Decreased sensitivity and specificity for detailed findings due to motion artifact. Cardiovascular: Normal heart size. No significant pericardial fluid/thickening. Great vessels are normal in course and caliber. No central pulmonary emboli. Coronary artery calcifications. Mediastinum/Nodes: Imaged thyroid gland without nodules meeting criteria for imaging follow-up by size. Normal esophagus. Unchanged 12 mm right hilar and 15 mm subcarinal lymphadenopathy. Lungs/Pleura: The central airways are patent. 3.2 x 2.7 cm right upper lobe mass (5:37), enlarged from 2.5 x 2.0 cm. Bilateral lower lobe subsegmental atelectasis. No pneumothorax. No pleural effusion. Musculoskeletal: No acute or abnormal lytic or blastic osseous  lesions. Degenerative changes of the right shoulder and partially imaged left shoulder. Multilevel degenerative changes of the thoracic spine. CT ABDOMEN PELVIS FINDINGS Hepatobiliary: No focal hepatic lesions. No intra or extrahepatic biliary ductal dilation. Cholelithiasis. Pancreas: No focal lesions or main ductal dilation. Spleen: Ill-defined 1.4 cm subcentimeter hypodensity (3:55), indeterminate. Adrenals/Urinary Tract: No right adrenal nodule. Thickened left adrenal gland without discrete nodule. No suspicious renal mass, calculi, or hydronephrosis. No focal bladder wall thickening. Stomach/Bowel: Normal appearance of the stomach. No evidence of bowel wall thickening, distention, or inflammatory changes. Colonic diverticulosis without acute diverticulitis. Appendix is not discretely seen. Vascular/Lymphatic: Aortic atherosclerosis. No enlarged abdominal or pelvic lymph nodes. Reproductive: No adnexal masses. Other: No free fluid, fluid collection, or free air. Musculoskeletal: No acute or abnormal lytic or blastic osseous findings. Multilevel degenerative changes of the lumbar spine. Postsurgical changes of the anterior abdominal wall. IMPRESSION: 1. No acute abnormality  in the chest, abdomen, or pelvis. 2. Increased size of known right upper lobe mass. 3. Unchanged mediastinal hilar lymphadenopathy, suspicious for metastasis. Electronically Signed   By: Agustin Cree M.D.   On: 02/11/2023 09:26     Subjective: Patient seen examined bedside, resting calmly.  Lying in bed.  RN and nursing student present at bedside.  Discharging home to resume home hospice services with a different company; [facility].  No other specific questions or concerns at this time.  Has declined definitive treatment for her gangrene/osteomyelitis.  Discussed with patient, if changes mind, may follow-up with orthopedics [name] as definitive treatment was recommended with bilateral above-knee amputation.  No other specific questions or concerns at this time.  Denies headache, no visual changes, no chest pain, no shortness of breath, no abdominal pain, no fever, no chills, no abdominal pain.  No acute events overnight per nursing staff.  Discharge Exam: Vitals:   02/24/23 0919 02/24/23 0943  BP: 114/61 (!) 130/58  Pulse: 66 68  Resp:  (!) 22  Temp:  98.5 F (36.9 C)  SpO2:  100%   Vitals:   02/23/23 2044 02/24/23 0334 02/24/23 0919 02/24/23 0943  BP: 117/67 114/61 114/61 (!) 130/58  Pulse: 71 66 66 68  Resp: (!) 22 20  (!) 22  Temp: 98.4 F (36.9 C) 98.5 F (36.9 C)  98.5 F (36.9 C)  TempSrc: Oral Oral  Oral  SpO2: 99% 99%  100%  Weight:      Height:        Physical Exam: GEN: NAD, alert, chronically ill appearance, appears older than stated age HEENT: NCAT, PERRL, EOMI, sclera clear, MMM PULM: CTAB w/o wheezes/crackles, normal respiratory effort, on 2 L nasal cannula which is her baseline CV: RRR w/o M/G/R GI: abd soft, NTND, NABS, MSK: Moves all EXTR independently, noted wounds bilateral lower extremities/heels as depicted below NEURO: CN II-XII intact, no focal deficits, sensation to light touch intact PSYCH: normal mood/affect Integumentary: Heel wounds as  depicted below, otherwise no other concerning rashes/lesions/wounds noted on exposed skin surfaces       The results of significant diagnostics from this hospitalization (including imaging, microbiology, ancillary and laboratory) are listed below for reference.     Microbiology: Recent Results (from the past 240 hour(s))  Blood Culture (routine x 2)     Status: None (Preliminary result)   Collection Time: 02/21/23  2:41 PM   Specimen: BLOOD  Result Value Ref Range Status   Specimen Description BLOOD RIGHT ANTECUBITAL  Final   Special Requests   Final  BOTTLES DRAWN AEROBIC AND ANAEROBIC Blood Culture results may not be optimal due to an excessive volume of blood received in culture bottles   Culture   Final    NO GROWTH 3 DAYS Performed at [facility], [address], [place], [place] [address]    Report Status PENDING  Incomplete  Resp panel by RT-PCR (RSV, Flu A&B, Covid) Anterior Nasal Swab     Status: None   Collection Time: 02/21/23  2:47 PM   Specimen: Anterior Nasal Swab  Result Value Ref Range Status   SARS Coronavirus 2 by RT PCR NEGATIVE NEGATIVE Final   Influenza A by PCR NEGATIVE NEGATIVE Final   Influenza B by PCR NEGATIVE NEGATIVE Final    Comment: (NOTE) The Xpert Xpress SARS-CoV-2/FLU/RSV plus assay is intended as an aid in the diagnosis of influenza from Nasopharyngeal swab specimens and should not be used as a sole basis for treatment. Nasal washings and aspirates are unacceptable for Xpert Xpress SARS-CoV-2/FLU/RSV testing.  Fact Sheet for Patients: BloggerCourse.com  Fact Sheet for Healthcare Providers: SeriousBroker.it  This test is not yet approved or cleared by the [place] FDA and has been authorized for detection and/or diagnosis of SARS-CoV-2 by FDA under an Emergency Use Authorization (EUA). This EUA will remain in effect (meaning this test can be used) for the duration of  the COVID-19 declaration under Section 564(b)(1) of the Act, 21 U.S.C. section 360bbb-3(b)(1), unless the authorization is terminated or revoked.     Resp Syncytial Virus by PCR NEGATIVE NEGATIVE Final    Comment: (NOTE) Fact Sheet for Patients: BloggerCourse.com  Fact Sheet for Healthcare Providers: SeriousBroker.it  This test is not yet approved or cleared by the [place] FDA and has been authorized for detection and/or diagnosis of SARS-CoV-2 by FDA under an Emergency Use Authorization (EUA). This EUA will remain in effect (meaning this test can be used) for the duration of the COVID-19 declaration under Section 564(b)(1) of the Act, 21 U.S.C. section 360bbb-3(b)(1), unless the authorization is terminated or revoked.  Performed at [facility], [address] [place], [place] [address]   Blood Culture (routine x 2)     Status: None (Preliminary result)   Collection Time: 02/21/23  3:56 PM   Specimen: BLOOD LEFT ARM  Result Value Ref Range Status   Specimen Description BLOOD LEFT ARM  Final   Special Requests   Final    BOTTLES DRAWN AEROBIC ONLY Blood Culture results may not be optimal due to an inadequate volume of blood received in culture bottles   Culture  Setup Time   Final    GRAM POSITIVE COCCI IN CLUSTERS AEROBIC BOTTLE ONLY CRITICAL RESULT CALLED TO, READ BACK BY AND VERIFIED WITH: PHARMD LYDIA CHEN ON 02/22/23 @ 2223 BY DRT    Culture   Final    GRAM POSITIVE COCCI CULTURE REINCUBATED FOR BETTER GROWTH Performed at [facility], [address], [place], [place] [address]    Report Status PENDING  Incomplete  Blood Culture ID Panel (Reflexed)     Status: Abnormal   Collection Time: 02/21/23  3:56 PM  Result Value Ref Range Status   Enterococcus faecalis NOT DETECTED NOT DETECTED Final   Enterococcus Faecium NOT DETECTED NOT DETECTED Final   Listeria monocytogenes NOT DETECTED NOT DETECTED  Final   Staphylococcus species DETECTED (A) NOT DETECTED Final    Comment: CRITICAL RESULT CALLED TO, READ BACK BY AND VERIFIED WITH: PHARMD LYDIA CHEN ON 02/22/23 @  2223 BY DRT    Staphylococcus aureus (BCID) NOT DETECTED NOT DETECTED Final   Staphylococcus epidermidis DETECTED (A) NOT DETECTED Final    Comment: Methicillin (oxacillin) resistant coagulase negative staphylococcus. Possible blood culture contaminant (unless isolated from more than one blood culture draw or clinical case suggests pathogenicity). No antibiotic treatment is indicated for blood  culture contaminants. CRITICAL RESULT CALLED TO, READ BACK BY AND VERIFIED WITH: PHARMD LYDIA CHEN ON 02/22/23 @ 2223 BY DRT    Staphylococcus lugdunensis NOT DETECTED NOT DETECTED Final   Streptococcus species NOT DETECTED NOT DETECTED Final   Streptococcus agalactiae NOT DETECTED NOT DETECTED Final   Streptococcus pneumoniae NOT DETECTED NOT DETECTED Final   Streptococcus pyogenes NOT DETECTED NOT DETECTED Final   A.calcoaceticus-baumannii NOT DETECTED NOT DETECTED Final   Bacteroides fragilis NOT DETECTED NOT DETECTED Final   Enterobacterales NOT DETECTED NOT DETECTED Final   Enterobacter cloacae complex NOT DETECTED NOT DETECTED Final   Escherichia coli NOT DETECTED NOT DETECTED Final   Klebsiella aerogenes NOT DETECTED NOT DETECTED Final   Klebsiella oxytoca NOT DETECTED NOT DETECTED Final   Klebsiella pneumoniae NOT DETECTED NOT DETECTED Final   Proteus species NOT DETECTED NOT DETECTED Final   Salmonella species NOT DETECTED NOT DETECTED Final   Serratia marcescens NOT DETECTED NOT DETECTED Final   Haemophilus influenzae NOT DETECTED NOT DETECTED Final   Neisseria meningitidis NOT DETECTED NOT DETECTED Final   Pseudomonas aeruginosa NOT DETECTED NOT DETECTED Final   Stenotrophomonas maltophilia NOT DETECTED NOT DETECTED Final   Candida albicans NOT DETECTED NOT DETECTED Final   Candida auris NOT DETECTED NOT DETECTED Final    Candida glabrata NOT DETECTED NOT DETECTED Final   Candida krusei NOT DETECTED NOT DETECTED Final   Candida parapsilosis NOT DETECTED NOT DETECTED Final   Candida tropicalis NOT DETECTED NOT DETECTED Final   Cryptococcus neoformans/gattii NOT DETECTED NOT DETECTED Final   Methicillin resistance mecA/C DETECTED (A) NOT DETECTED Final    Comment: CRITICAL RESULT CALLED TO, READ BACK BY AND VERIFIED WITH: PHARMD LYDIA CHEN ON 02/22/23 @ 2223 BY DRT Performed at [facility], [address] [place], [place] [address]      Labs: BNP (last 3 results) Recent Labs    09/12/22 0906 10/28/22 0957 02/11/23 0430  BNP 75.7 62.8 107.4*   Basic Metabolic Panel: Recent Labs  Lab 02/21/23 1441  NA 132*  K 4.4  CL 97*  CO2 25  GLUCOSE 121*  BUN 11  CREATININE 0.73  CALCIUM 7.9*   Liver Function Tests: Recent Labs  Lab 02/21/23 1441  AST 17  ALT 8  ALKPHOS 94  BILITOT 0.8  PROT 5.3*  ALBUMIN 1.8*   No results for input(s): "LIPASE", "AMYLASE" in the last 168 hours. No results for input(s): "AMMONIA" in the last 168 hours. CBC: Recent Labs  Lab 02/21/23 1441  WBC 14.8*  NEUTROABS 11.6*  HGB 7.3*  HCT 23.8*  MCV 84.1  PLT 421*   Cardiac Enzymes: No results for input(s): "CKTOTAL", "CKMB", "CKMBINDEX", "TROPONINI" in the last 168 hours. BNP: Invalid input(s): "POCBNP" CBG: Recent Labs  Lab 02/23/23 0834 02/23/23 1132 02/23/23 1637 02/23/23 2045 02/24/23 0854  GLUCAP 150* 199* 170* 199* 173*   D-Dimer No results for input(s): "DDIMER" in the last 72 hours. Hgb A1c No results for input(s): "HGBA1C" in the last 72 hours. Lipid Profile No results for input(s): "CHOL", "HDL", "LDLCALC", "TRIG", "CHOLHDL", "LDLDIRECT" in the last 72 hours. Thyroid function studies No results for input(s): "  TSH", "T4TOTAL", "T3FREE", "THYROIDAB" in the last 72 hours.  Invalid input(s): "FREET3" Anemia work up No results for input(s): "VITAMINB12", "FOLATE", "FERRITIN",  "TIBC", "IRON", "RETICCTPCT" in the last 72 hours. Urinalysis    Component Value Date/Time   COLORURINE YELLOW 12/24/2022 1251   APPEARANCEUR CLEAR 12/24/2022 1251   LABSPEC 1.036 (H) 12/24/2022 1251   PHURINE 6.0 12/24/2022 1251   GLUCOSEU NEGATIVE 12/24/2022 1251   GLUCOSEU >=1000 (A) 05/31/2017 1022   HGBUR NEGATIVE 12/24/2022 1251   BILIRUBINUR NEGATIVE 12/24/2022 1251   KETONESUR 5 (A) 12/24/2022 1251   PROTEINUR 100 (A) 12/24/2022 1251   UROBILINOGEN 1.0 05/31/2017 1022   NITRITE NEGATIVE 12/24/2022 1251   LEUKOCYTESUR NEGATIVE 12/24/2022 1251   Sepsis Labs Recent Labs  Lab 02/21/23 1441  WBC 14.8*   Microbiology Recent Results (from the past 240 hour(s))  Blood Culture (routine x 2)     Status: None (Preliminary result)   Collection Time: 02/21/23  2:41 PM   Specimen: BLOOD  Result Value Ref Range Status   Specimen Description BLOOD RIGHT ANTECUBITAL  Final   Special Requests   Final    BOTTLES DRAWN AEROBIC AND ANAEROBIC Blood Culture results may not be optimal due to an excessive volume of blood received in culture bottles   Culture   Final    NO GROWTH 3 DAYS Performed at [facility], [address], [place], [place] [address]    Report Status PENDING  Incomplete  Resp panel by RT-PCR (RSV, Flu A&B, Covid) Anterior Nasal Swab     Status: None   Collection Time: 02/21/23  2:47 PM   Specimen: Anterior Nasal Swab  Result Value Ref Range Status   SARS Coronavirus 2 by RT PCR NEGATIVE NEGATIVE Final   Influenza A by PCR NEGATIVE NEGATIVE Final   Influenza B by PCR NEGATIVE NEGATIVE Final    Comment: (NOTE) The Xpert Xpress SARS-CoV-2/FLU/RSV plus assay is intended as an aid in the diagnosis of influenza from Nasopharyngeal swab specimens and should not be used as a sole basis for treatment. Nasal washings and aspirates are unacceptable for Xpert Xpress SARS-CoV-2/FLU/RSV testing.  Fact Sheet for  Patients: BloggerCourse.com  Fact Sheet for Healthcare Providers: SeriousBroker.it  This test is not yet approved or cleared by the [place] FDA and has been authorized for detection and/or diagnosis of SARS-CoV-2 by FDA under an Emergency Use Authorization (EUA). This EUA will remain in effect (meaning this test can be used) for the duration of the COVID-19 declaration under Section 564(b)(1) of the Act, 21 U.S.C. section 360bbb-3(b)(1), unless the authorization is terminated or revoked.     Resp Syncytial Virus by PCR NEGATIVE NEGATIVE Final    Comment: (NOTE) Fact Sheet for Patients: BloggerCourse.com  Fact Sheet for Healthcare Providers: SeriousBroker.it  This test is not yet approved or cleared by the [place] FDA and has been authorized for detection and/or diagnosis of SARS-CoV-2 by FDA under an Emergency Use Authorization (EUA). This EUA will remain in effect (meaning this test can be used) for the duration of the COVID-19 declaration under Section 564(b)(1) of the Act, 21 U.S.C. section 360bbb-3(b)(1), unless the authorization is terminated or revoked.  Performed at [facility], [address] [place], [place] [address]   Blood Culture (routine x 2)     Status: None (Preliminary result)   Collection Time: 02/21/23  3:56 PM   Specimen: BLOOD LEFT ARM  Result Value Ref Range Status   Specimen Description BLOOD LEFT  ARM  Final   Special Requests   Final    BOTTLES DRAWN AEROBIC ONLY Blood Culture results may not be optimal due to an inadequate volume of blood received in culture bottles   Culture  Setup Time   Final    GRAM POSITIVE COCCI IN CLUSTERS AEROBIC BOTTLE ONLY CRITICAL RESULT CALLED TO, READ BACK BY AND VERIFIED WITH: PHARMD LYDIA CHEN ON 02/22/23 @ 2223 BY DRT    Culture   Final    GRAM POSITIVE COCCI CULTURE REINCUBATED FOR BETTER  GROWTH Performed at [facility], [address], [place], [place] [address]    Report Status PENDING  Incomplete  Blood Culture ID Panel (Reflexed)     Status: Abnormal   Collection Time: 02/21/23  3:56 PM  Result Value Ref Range Status   Enterococcus faecalis NOT DETECTED NOT DETECTED Final   Enterococcus Faecium NOT DETECTED NOT DETECTED Final   Listeria monocytogenes NOT DETECTED NOT DETECTED Final   Staphylococcus species DETECTED (A) NOT DETECTED Final    Comment: CRITICAL RESULT CALLED TO, READ BACK BY AND VERIFIED WITH: PHARMD LYDIA CHEN ON 02/22/23 @ 2223 BY DRT    Staphylococcus aureus (BCID) NOT DETECTED NOT DETECTED Final   Staphylococcus epidermidis DETECTED (A) NOT DETECTED Final    Comment: Methicillin (oxacillin) resistant coagulase negative staphylococcus. Possible blood culture contaminant (unless isolated from more than one blood culture draw or clinical case suggests pathogenicity). No antibiotic treatment is indicated for blood  culture contaminants. CRITICAL RESULT CALLED TO, READ BACK BY AND VERIFIED WITH: PHARMD LYDIA CHEN ON 02/22/23 @ 2223 BY DRT    Staphylococcus lugdunensis NOT DETECTED NOT DETECTED Final   Streptococcus species NOT DETECTED NOT DETECTED Final   Streptococcus agalactiae NOT DETECTED NOT DETECTED Final   Streptococcus pneumoniae NOT DETECTED NOT DETECTED Final   Streptococcus pyogenes NOT DETECTED NOT DETECTED Final   A.calcoaceticus-baumannii NOT DETECTED NOT DETECTED Final   Bacteroides fragilis NOT DETECTED NOT DETECTED Final   Enterobacterales NOT DETECTED NOT DETECTED Final   Enterobacter cloacae complex NOT DETECTED NOT DETECTED Final   Escherichia coli NOT DETECTED NOT DETECTED Final   Klebsiella aerogenes NOT DETECTED NOT DETECTED Final   Klebsiella oxytoca NOT DETECTED NOT DETECTED Final   Klebsiella pneumoniae NOT DETECTED NOT DETECTED Final   Proteus species NOT DETECTED NOT DETECTED Final   Salmonella species NOT DETECTED  NOT DETECTED Final   Serratia marcescens NOT DETECTED NOT DETECTED Final   Haemophilus influenzae NOT DETECTED NOT DETECTED Final   Neisseria meningitidis NOT DETECTED NOT DETECTED Final   Pseudomonas aeruginosa NOT DETECTED NOT DETECTED Final   Stenotrophomonas maltophilia NOT DETECTED NOT DETECTED Final   Candida albicans NOT DETECTED NOT DETECTED Final   Candida auris NOT DETECTED NOT DETECTED Final   Candida glabrata NOT DETECTED NOT DETECTED Final   Candida krusei NOT DETECTED NOT DETECTED Final   Candida parapsilosis NOT DETECTED NOT DETECTED Final   Candida tropicalis NOT DETECTED NOT DETECTED Final   Cryptococcus neoformans/gattii NOT DETECTED NOT DETECTED Final   Methicillin resistance mecA/C DETECTED (A) NOT DETECTED Final    Comment: CRITICAL RESULT CALLED TO, READ BACK BY AND VERIFIED WITH: PHARMD LYDIA CHEN ON 02/22/23 @ 2223 BY DRT Performed at [facility], [address] [place], [place] [address]      Time coordinating discharge: Over 30 minutes  SIGNED:   Alvira Philips [place], DO  [facility] 02/24/2023, 10:43 AM

## 2023-02-25 ENCOUNTER — Other Ambulatory Visit (HOSPITAL_COMMUNITY): Payer: Self-pay

## 2023-02-26 LAB — CULTURE, BLOOD (ROUTINE X 2): Culture: NO GROWTH

## 2023-03-29 ENCOUNTER — Inpatient Hospital Stay: Payer: Medicare Other | Attending: Internal Medicine

## 2023-04-05 ENCOUNTER — Inpatient Hospital Stay: Payer: Medicare Other | Admitting: Internal Medicine
